# Patient Record
Sex: Male | Born: 1940
Health system: Southern US, Community
[De-identification: ages and names within clinical notes are randomized; demographics above are authoritative.]

## PROBLEM LIST (undated history)

## (undated) DIAGNOSIS — K5790 Diverticulosis of intestine, part unspecified, without perforation or abscess without bleeding: Secondary | ICD-10-CM

## (undated) DIAGNOSIS — I10 Essential (primary) hypertension: Secondary | ICD-10-CM

## (undated) DIAGNOSIS — R413 Other amnesia: Secondary | ICD-10-CM

## (undated) DIAGNOSIS — E785 Hyperlipidemia, unspecified: Secondary | ICD-10-CM

## (undated) DIAGNOSIS — E559 Vitamin D deficiency, unspecified: Secondary | ICD-10-CM

## (undated) DIAGNOSIS — R7303 Prediabetes: Secondary | ICD-10-CM

## (undated) HISTORY — DX: Other amnesia: R41.3

## (undated) HISTORY — DX: Prediabetes: R73.03

## (undated) HISTORY — DX: Hyperlipidemia, unspecified: E78.5

## (undated) HISTORY — DX: Vitamin D deficiency, unspecified: E55.9

## (undated) HISTORY — DX: Essential (primary) hypertension: I10

## (undated) HISTORY — PX: NO PAST SURGERIES: SHX2092

## (undated) HISTORY — DX: Diverticulosis of intestine, part unspecified, without perforation or abscess without bleeding: K57.90

---

## 2000-05-06 ENCOUNTER — Ambulatory Visit (HOSPITAL_BASED_OUTPATIENT_CLINIC_OR_DEPARTMENT_OTHER): Admission: RE | Admit: 2000-05-06 | Discharge: 2000-05-06 | Payer: Self-pay | Admitting: Surgery

## 2003-03-15 ENCOUNTER — Ambulatory Visit (HOSPITAL_COMMUNITY): Admission: RE | Admit: 2003-03-15 | Discharge: 2003-03-15 | Payer: Self-pay | Admitting: Internal Medicine

## 2003-03-15 ENCOUNTER — Encounter: Payer: Self-pay | Admitting: Internal Medicine

## 2004-03-15 ENCOUNTER — Ambulatory Visit (HOSPITAL_COMMUNITY): Admission: RE | Admit: 2004-03-15 | Discharge: 2004-03-15 | Payer: Self-pay | Admitting: Internal Medicine

## 2006-06-26 ENCOUNTER — Ambulatory Visit (HOSPITAL_COMMUNITY): Admission: RE | Admit: 2006-06-26 | Discharge: 2006-06-26 | Payer: Self-pay | Admitting: Internal Medicine

## 2007-07-30 ENCOUNTER — Ambulatory Visit (HOSPITAL_COMMUNITY): Admission: RE | Admit: 2007-07-30 | Discharge: 2007-07-30 | Payer: Self-pay | Admitting: Internal Medicine

## 2009-11-22 ENCOUNTER — Emergency Department (HOSPITAL_COMMUNITY): Admission: EM | Admit: 2009-11-22 | Discharge: 2009-11-22 | Payer: Self-pay | Admitting: Emergency Medicine

## 2012-10-29 ENCOUNTER — Other Ambulatory Visit: Payer: Self-pay | Admitting: Physician Assistant

## 2013-03-21 ENCOUNTER — Encounter: Payer: Self-pay | Admitting: Gastroenterology

## 2013-10-29 DIAGNOSIS — I10 Essential (primary) hypertension: Secondary | ICD-10-CM | POA: Insufficient documentation

## 2013-10-29 DIAGNOSIS — E785 Hyperlipidemia, unspecified: Secondary | ICD-10-CM | POA: Insufficient documentation

## 2013-10-29 DIAGNOSIS — E559 Vitamin D deficiency, unspecified: Secondary | ICD-10-CM | POA: Insufficient documentation

## 2013-10-29 DIAGNOSIS — R7309 Other abnormal glucose: Secondary | ICD-10-CM | POA: Insufficient documentation

## 2013-10-29 DIAGNOSIS — K5790 Diverticulosis of intestine, part unspecified, without perforation or abscess without bleeding: Secondary | ICD-10-CM | POA: Insufficient documentation

## 2013-11-02 ENCOUNTER — Encounter: Payer: Self-pay | Admitting: Internal Medicine

## 2013-11-02 ENCOUNTER — Ambulatory Visit (INDEPENDENT_AMBULATORY_CARE_PROVIDER_SITE_OTHER): Payer: Medicare HMO | Admitting: Internal Medicine

## 2013-11-02 VITALS — BP 128/78 | HR 60 | Temp 98.1°F | Resp 16 | Wt 189.0 lb

## 2013-11-02 DIAGNOSIS — E559 Vitamin D deficiency, unspecified: Secondary | ICD-10-CM

## 2013-11-02 DIAGNOSIS — R7303 Prediabetes: Secondary | ICD-10-CM

## 2013-11-02 DIAGNOSIS — E785 Hyperlipidemia, unspecified: Secondary | ICD-10-CM

## 2013-11-02 DIAGNOSIS — R7309 Other abnormal glucose: Secondary | ICD-10-CM

## 2013-11-02 DIAGNOSIS — I10 Essential (primary) hypertension: Secondary | ICD-10-CM

## 2013-11-02 DIAGNOSIS — Z79899 Other long term (current) drug therapy: Secondary | ICD-10-CM | POA: Insufficient documentation

## 2013-11-02 LAB — BASIC METABOLIC PANEL WITH GFR
BUN: 14 mg/dL (ref 6–23)
CALCIUM: 9.6 mg/dL (ref 8.4–10.5)
CO2: 26 meq/L (ref 19–32)
CREATININE: 1.1 mg/dL (ref 0.50–1.35)
Chloride: 106 mEq/L (ref 96–112)
GFR, Est African American: 77 mL/min
GFR, Est Non African American: 67 mL/min
Glucose, Bld: 82 mg/dL (ref 70–99)
Potassium: 4.4 mEq/L (ref 3.5–5.3)
Sodium: 141 mEq/L (ref 135–145)

## 2013-11-02 LAB — HEPATIC FUNCTION PANEL
ALBUMIN: 4.4 g/dL (ref 3.5–5.2)
ALK PHOS: 51 U/L (ref 39–117)
ALT: 16 U/L (ref 0–53)
AST: 17 U/L (ref 0–37)
BILIRUBIN DIRECT: 0.1 mg/dL (ref 0.0–0.3)
BILIRUBIN TOTAL: 0.5 mg/dL (ref 0.2–1.2)
Indirect Bilirubin: 0.4 mg/dL (ref 0.2–1.2)
Total Protein: 6.9 g/dL (ref 6.0–8.3)

## 2013-11-02 LAB — LIPID PANEL
Cholesterol: 172 mg/dL (ref 0–200)
HDL: 48 mg/dL (ref >39)
LDL CALC: 111 mg/dL — AB (ref 0–99)
Total CHOL/HDL Ratio: 3.6 Ratio
Triglycerides: 67 mg/dL (ref <150)
VLDL: 13 mg/dL (ref 0–40)

## 2013-11-02 LAB — CBC WITH DIFFERENTIAL/PLATELET
Basophils Absolute: 0 10*3/uL (ref 0.0–0.1)
Basophils Relative: 1 % (ref 0–1)
Eosinophils Absolute: 0 10*3/uL (ref 0.0–0.7)
Eosinophils Relative: 0 % (ref 0–5)
HCT: 40.8 % (ref 39.0–52.0)
Hemoglobin: 13.6 g/dL (ref 13.0–17.0)
LYMPHS PCT: 46 % (ref 12–46)
Lymphs Abs: 2.3 10*3/uL (ref 0.7–4.0)
MCH: 29.6 pg (ref 26.0–34.0)
MCHC: 33.3 g/dL (ref 30.0–36.0)
MCV: 88.9 fL (ref 78.0–100.0)
Monocytes Absolute: 0.5 10*3/uL (ref 0.1–1.0)
Monocytes Relative: 9 % (ref 3–12)
NEUTROS ABS: 2.2 10*3/uL (ref 1.7–7.7)
Neutrophils Relative %: 44 % (ref 43–77)
PLATELETS: 181 10*3/uL (ref 150–400)
RBC: 4.59 MIL/uL (ref 4.22–5.81)
RDW: 14.5 % (ref 11.5–15.5)
WBC: 5 10*3/uL (ref 4.0–10.5)

## 2013-11-02 LAB — HEMOGLOBIN A1C
Hgb A1c MFr Bld: 5.7 % — ABNORMAL HIGH (ref <5.7)
Mean Plasma Glucose: 117 mg/dL — ABNORMAL HIGH (ref <117)

## 2013-11-02 LAB — MAGNESIUM: MAGNESIUM: 2.1 mg/dL (ref 1.5–2.5)

## 2013-11-02 LAB — TSH: TSH: 0.833 u[IU]/mL (ref 0.350–4.500)

## 2013-11-02 NOTE — Patient Instructions (Signed)

## 2013-11-02 NOTE — Progress Notes (Signed)
Patient ID: Javier [name], male   DOB: April 16, 1941, 73 y.o.   MRN: [id number]   This very nice 73 y.o. MBM presents for 3 month follow up with Hypertension, Hyperlipidemia, Pre-Diabetes and Vitamin D Deficiency.    HTN predates since 2004 . BP has been controlled at home. Today's BP: 128/78 mmHg. Patient denies any cardiac type chest pain, palpitations, dyspnea/orthopnea/PND, dizziness, claudication, or dependent edema.   Hyperlipidemia is controlled with diet & meds. Last Cholesterol was 164, Triglycerides were 78, HDL 41 and LDL 102 in Oct 2014. Patient denies myalgias or other med SE's.    Also, the patient has history of PreDiabetes with A1c 5.8% in 08/2009 and with last A1c of 5.7% in Oct 2014. Patient denies any symptoms of reactive hypoglycemia, diabetic polys, paresthesias or visual blurring.   Further, Patient has history of Vitamin D Deficiency of 50 in 2008 and with last vitamin D of 78 in Oct 2014. Patient supplements vitamin D without any suspected side-effects.    Medication List       This list is accurate as of: 11/02/13  8:33 PM.  Always use your most recent med list.               BABY ASPIRIN PO  Take 81 mg by mouth daily.     benazepril-hydrochlorthiazide 20-25 MG per tablet  Commonly known as:  LOTENSIN HCT  Take 1 tablet by mouth daily.     omeprazole 20 MG capsule  Commonly known as:  PRILOSEC  Take 20 mg by mouth daily.     pravastatin 40 MG tablet  Commonly known as:  PRAVACHOL  Take 40 mg by mouth daily.     VITAMIN D PO  Take 5,000 Int'l Units by mouth daily.         Allergies  Allergen Reactions  . Penicillins   . Ppd [Tuberculin Purified Protein Derivative]     Positive test in 2004    PMHx:   Past Medical History  Diagnosis Date  . Hyperlipidemia   . Hypertension   . Prediabetes   . Vitamin D deficiency   . Diverticulosis     FHx:    Reviewed / unchanged  SHx:    Reviewed / unchanged  Systems Review: Constitutional: Denies  fever, chills, wt changes, headaches, insomnia, fatigue, night sweats, change in appetite. Eyes: Denies redness, blurred vision, diplopia, discharge, itchy, watery eyes.  ENT: Denies discharge, congestion, post nasal drip, epistaxis, sore throat, earache, hearing loss, dental pain, tinnitus, vertigo, sinus pain, snoring.  CV: Denies chest pain, palpitations, irregular heartbeat, syncope, dyspnea, diaphoresis, orthopnea, PND, claudication, edema. Respiratory: denies cough, dyspnea, DOE, pleurisy, hoarseness, laryngitis, wheezing.  Gastrointestinal: Denies dysphagia, odynophagia, heartburn, reflux, water brash, abdominal pain or cramps, nausea, vomiting, bloating, diarrhea, constipation, hematemesis, melena, hematochezia,  or hemorrhoids. Genitourinary: Denies dysuria, frequency, urgency, nocturia, hesitancy, discharge, hematuria, flank pain. Musculoskeletal: Denies arthralgias, myalgias, stiffness, jt. swelling, pain, limp, strain/sprain.  Skin: Denies pruritus, rash, hives, warts, acne, eczema, change in skin lesion(s). Neuro: No weakness, tremor, incoordination, spasms, paresthesia, or pain. Psychiatric: Denies confusion, memory loss, or sensory loss. Endo: Denies change in weight, skin, hair change.  Heme/Lymph: No excessive bleeding, bruising, orenlarged lymph nodes.  BP: 128/78  Pulse: 60  Temp: 98.1 F (36.7 C)  Resp: 16      On Exam: Appears well nourished - in no distress. Eyes: PERRLA, EOMs, conjunctiva no swelling or erythema. Sinuses: No frontal/maxillary tenderness ENT/Mouth: EAC's clear, TM's nl w/o erythema, bulging.  Nares clear w/o erythema, swelling, exudates. Oropharynx clear without erythema or exudates. Oral hygiene is good. Tongue normal, non obstructing. Hearing intact.  Neck: Supple. Thyroid nl. Car 2+/2+ without bruits, nodes or JVD. Chest: Respirations nl with BS clear & equal w/o rales, rhonchi, wheezing or stridor.  Cor: Heart sounds normal w/ regular rate and  rhythm without sig. murmurs, gallops, clicks, or rubs. Peripheral pulses normal and equal  without edema.  Abdomen: Soft & bowel sounds normal. Non-tender w/o guarding, rebound, hernias, masses, or organomegaly.  Lymphatics: Unremarkable.  Musculoskeletal: Full ROM all peripheral extremities, joint stability, 5/5 strength, and normal gait.  Skin: Warm, dry without exposed rashes, lesions, ecchymosis apparent.  Neuro: Cranial nerves intact, reflexes equal bilaterally. Sensory-motor testing grossly intact. Tendon reflexes grossly intact.  Pysch: Alert & oriented x 3. Insight and judgement nl & appropriate. No ideations.  Assessment and Plan:  1. Hypertension - Continue monitor blood pressure at home. Continue diet/meds same.  2. Hyperlipidemia - Continue diet/meds, exercise,& lifestyle modifications. Continue monitor periodic cholesterol/liver & renal functions   3. Pre-diabetes - Continue diet, exercise, lifestyle modifications. Monitor appropriate labs.  4. Vitamin D Deficiency - Continue supplementation.  Recommended regular exercise, BP monitoring, weight control, and discussed med and SE's. Recommended labs to assess and monitor clinical status. Further disposition pending results of labs.

## 2013-11-03 LAB — VITAMIN D 25 HYDROXY (VIT D DEFICIENCY, FRACTURES): Vit D, 25-Hydroxy: 84 ng/mL (ref 30–89)

## 2013-11-03 LAB — INSULIN, FASTING: Insulin fasting, serum: 6 u[IU]/mL (ref 3–28)

## 2013-11-07 ENCOUNTER — Telehealth: Payer: Self-pay

## 2013-11-07 NOTE — Telephone Encounter (Signed)
lmom to pt about lab results.

## 2013-11-07 NOTE — Telephone Encounter (Signed)
Message copied by Nadyne Coombes on Mon Nov 07, 2013  9:44 AM ------      Message from: Unk Pinto      Created: Sun Nov 06, 2013 11:41 PM       Cbc kidneys liver thyroid Mg cholesterol A1c all nl & OK - keep up the great work ------

## 2014-01-31 ENCOUNTER — Ambulatory Visit: Payer: Self-pay | Admitting: Physician Assistant

## 2014-02-16 ENCOUNTER — Encounter: Payer: Self-pay | Admitting: Physician Assistant

## 2014-02-16 ENCOUNTER — Ambulatory Visit (INDEPENDENT_AMBULATORY_CARE_PROVIDER_SITE_OTHER): Payer: Commercial Managed Care - HMO | Admitting: Physician Assistant

## 2014-02-16 VITALS — BP 148/80 | HR 64 | Temp 98.1°F | Resp 16 | Wt 184.0 lb

## 2014-02-16 DIAGNOSIS — Z0000 Encounter for general adult medical examination without abnormal findings: Secondary | ICD-10-CM

## 2014-02-16 DIAGNOSIS — E785 Hyperlipidemia, unspecified: Secondary | ICD-10-CM

## 2014-02-16 DIAGNOSIS — Z1331 Encounter for screening for depression: Secondary | ICD-10-CM

## 2014-02-16 DIAGNOSIS — Z79899 Other long term (current) drug therapy: Secondary | ICD-10-CM

## 2014-02-16 DIAGNOSIS — Z789 Other specified health status: Secondary | ICD-10-CM

## 2014-02-16 DIAGNOSIS — I10 Essential (primary) hypertension: Secondary | ICD-10-CM

## 2014-02-16 DIAGNOSIS — E559 Vitamin D deficiency, unspecified: Secondary | ICD-10-CM

## 2014-02-16 DIAGNOSIS — R7303 Prediabetes: Secondary | ICD-10-CM

## 2014-02-16 LAB — LIPID PANEL
Cholesterol: 160 mg/dL (ref 0–200)
HDL: 43 mg/dL (ref >39)
LDL CALC: 104 mg/dL — AB (ref 0–99)
Total CHOL/HDL Ratio: 3.7 Ratio
Triglycerides: 64 mg/dL (ref <150)
VLDL: 13 mg/dL (ref 0–40)

## 2014-02-16 LAB — BASIC METABOLIC PANEL WITH GFR
BUN: 13 mg/dL (ref 6–23)
CHLORIDE: 104 meq/L (ref 96–112)
CO2: 27 mEq/L (ref 19–32)
CREATININE: 1.17 mg/dL (ref 0.50–1.35)
Calcium: 9.8 mg/dL (ref 8.4–10.5)
GFR, Est African American: 71 mL/min
GFR, Est Non African American: 61 mL/min
GLUCOSE: 86 mg/dL (ref 70–99)
Potassium: 3.8 mEq/L (ref 3.5–5.3)
Sodium: 141 mEq/L (ref 135–145)

## 2014-02-16 LAB — CBC WITH DIFFERENTIAL/PLATELET
Basophils Absolute: 0 10*3/uL (ref 0.0–0.1)
Basophils Relative: 0 % (ref 0–1)
EOS PCT: 1 % (ref 0–5)
Eosinophils Absolute: 0 10*3/uL (ref 0.0–0.7)
HEMATOCRIT: 41.9 % (ref 39.0–52.0)
HEMOGLOBIN: 14 g/dL (ref 13.0–17.0)
LYMPHS ABS: 2.2 10*3/uL (ref 0.7–4.0)
LYMPHS PCT: 46 % (ref 12–46)
MCH: 29.5 pg (ref 26.0–34.0)
MCHC: 33.4 g/dL (ref 30.0–36.0)
MCV: 88.2 fL (ref 78.0–100.0)
MONO ABS: 0.3 10*3/uL (ref 0.1–1.0)
MONOS PCT: 7 % (ref 3–12)
Neutro Abs: 2.2 10*3/uL (ref 1.7–7.7)
Neutrophils Relative %: 46 % (ref 43–77)
Platelets: 181 10*3/uL (ref 150–400)
RBC: 4.75 MIL/uL (ref 4.22–5.81)
RDW: 14.1 % (ref 11.5–15.5)
WBC: 4.7 10*3/uL (ref 4.0–10.5)

## 2014-02-16 LAB — HEMOGLOBIN A1C
Hgb A1c MFr Bld: 5.9 % — ABNORMAL HIGH (ref <5.7)
Mean Plasma Glucose: 123 mg/dL — ABNORMAL HIGH (ref <117)

## 2014-02-16 LAB — HEPATIC FUNCTION PANEL
ALBUMIN: 4.3 g/dL (ref 3.5–5.2)
ALK PHOS: 54 U/L (ref 39–117)
ALT: 15 U/L (ref 0–53)
AST: 18 U/L (ref 0–37)
Bilirubin, Direct: 0.1 mg/dL (ref 0.0–0.3)
Indirect Bilirubin: 0.5 mg/dL (ref 0.2–1.2)
TOTAL PROTEIN: 7.2 g/dL (ref 6.0–8.3)
Total Bilirubin: 0.6 mg/dL (ref 0.2–1.2)

## 2014-02-16 LAB — MAGNESIUM: Magnesium: 2 mg/dL (ref 1.5–2.5)

## 2014-02-16 NOTE — Patient Instructions (Addendum)
Call Dr. Deatra Ina to schedule a screening colonoscopy- Phone: [phone];   [employer] Free Hearing Test with no obligation # [phone] Tues-Sat 10-6  Preventative Care for Adults, Male       [place]:  A routine yearly physical is a good way to check in with your primary care provider about your health and preventive screening. It is also an opportunity to share updates about your health and any concerns you have, and receive a thorough all-over exam.   Most health insurance companies pay for at least some preventative services.  Check with your health plan for specific coverages.  WHAT PREVENTATIVE SERVICES DO MEN NEED?  Adult men should have their weight and blood pressure checked regularly.   Men age 5 and older should have their cholesterol levels checked regularly.  Beginning at age 24 and continuing to age 57, men should be screened for colorectal cancer.  Certain people should may need continued testing until age 51.  Other cancer screening may include exams for testicular and prostate cancer.  Updating vaccinations is part of preventative care.  Vaccinations help protect against diseases such as the flu.  Lab tests are generally done as part of preventative care to screen for anemia and blood disorders, to screen for problems with the kidneys and liver, to screen for bladder problems, to check blood sugar, and to check your cholesterol level.  Preventative services generally include counseling about diet, exercise, avoiding tobacco, drugs, excessive alcohol consumption, and sexually transmitted infections.    GENERAL RECOMMENDATIONS FOR GOOD HEALTH:  Healthy diet:  Eat a variety of foods, including fruit, vegetables, animal or vegetable protein, such as meat, fish, chicken, and eggs, or beans, lentils, tofu, and grains, such as rice.  Drink plenty of water daily.  Decrease saturated fat in the diet, avoid lots of red meat, processed foods, sweets, fast  foods, and fried foods.  Exercise:  Aerobic exercise helps maintain good heart health. At least 30-40 minutes of moderate-intensity exercise is recommended. For example, a brisk walk that increases your heart rate and breathing. This should be done on most days of the week.   Find a type of exercise or a variety of exercises that you enjoy so that it becomes a part of your daily life.  Examples are running, walking, swimming, water aerobics, and biking.  For motivation and support, explore group exercise such as aerobic class, spin class, Zumba, Yoga,or  martial arts, etc.    Set exercise goals for yourself, such as a certain weight goal, walk or run in a race such as a 5k walk/run.  Speak to your primary care provider about exercise goals.  Disease prevention:  If you smoke or chew tobacco, find out from your caregiver how to quit. It can literally save your life, no matter how long you have been a tobacco user. If you do not use tobacco, never begin.   Maintain a healthy diet and normal weight. Increased weight leads to problems with blood pressure and diabetes.   The Body Mass Index or BMI is a way of measuring how much of your body is fat. Having a BMI above 27 increases the risk of heart disease, diabetes, hypertension, stroke and other problems related to obesity. Your caregiver can help determine your BMI and based on it develop an exercise and dietary program to help you achieve or maintain this important measurement at a healthful level.  High blood pressure causes heart and blood vessel problems.  Persistent  high blood pressure should be treated with medicine if weight loss and exercise do not work.   Fat and cholesterol leaves deposits in your arteries that can block them. This causes heart disease and vessel disease elsewhere in your body.  If your cholesterol is found to be high, or if you have heart disease or certain other medical conditions, then you may need to have your  cholesterol monitored frequently and be treated with medication.   Ask if you should have a stress test if your history suggests this. A stress test is a test done on a treadmill that looks for heart disease. This test can find disease prior to there being a problem.  Avoid drinking alcohol in excess (more than two drinks per day).  Avoid use of street drugs. Do not share needles with anyone. Ask for professional help if you need assistance or instructions on stopping the use of alcohol, cigarettes, and/or drugs.  Brush your teeth twice a day with fluoride toothpaste, and floss once a day. Good oral hygiene prevents tooth decay and gum disease. The problems can be painful, unattractive, and can cause other health problems. Visit your dentist for a routine oral and dental check up and preventive care every 6-12 months.   Look at your skin regularly.  Use a mirror to look at your back. Notify your caregivers of changes in moles, especially if there are changes in shapes, colors, a size larger than a pencil eraser, an irregular border, or development of new moles.  Safety:  Use seatbelts 100% of the time, whether driving or as a passenger.  Use safety devices such as hearing protection if you work in environments with loud noise or significant background noise.  Use safety glasses when doing any work that could send debris in to the eyes.  Use a helmet if you ride a bike or motorcycle.  Use appropriate safety gear for contact sports.  Talk to your caregiver about gun safety.  Use sunscreen with a SPF (or skin protection factor) of 15 or greater.  Lighter skinned people are at a greater risk of skin cancer. Don't forget to also wear sunglasses in order to protect your eyes from too much damaging sunlight. Damaging sunlight can accelerate cataract formation.   Practice safe sex. Use condoms. Condoms are used for birth control and to help reduce the spread of sexually transmitted infections (or STIs).   Some of the STIs are gonorrhea (the clap), chlamydia, syphilis, trichomonas, herpes, HPV (human papilloma virus) and HIV (human immunodeficiency virus) which causes AIDS. The herpes, HIV and HPV are viral illnesses that have no cure. These can result in disability, cancer and death.   Keep carbon monoxide and smoke detectors in your home functioning at all times. Change the batteries every 6 months or use a model that plugs into the wall.   Vaccinations:  Stay up to date with your tetanus shots and other required immunizations. You should have a booster for tetanus every 10 years. Be sure to get your flu shot every year, since 5%-20% of the [place] population comes down with the flu. The flu vaccine changes each year, so being vaccinated once is not enough. Get your shot in the fall, before the flu season peaks.   Other vaccines to consider:  Pneumococcal vaccine to protect against certain types of pneumonia.  This is normally recommended for adults age 44 or older.  However, adults younger than 73 years old with certain underlying conditions such as diabetes,  heart or lung disease should also receive the vaccine.  Shingles vaccine to protect against Varicella Zoster if you are older than age 60, or younger than 73 years old with certain underlying illness.  Hepatitis A vaccine to protect against a form of infection of the liver by a virus acquired from food.  Hepatitis B vaccine to protect against a form of infection of the liver by a virus acquired from blood or body fluids, particularly if you work in health care.  If you plan to travel internationally, check with your local health department for specific vaccination recommendations.  Cancer Screening:  Most routine colon cancer screening begins at the age of 71. On a yearly basis, doctors may provide special easy to use take-home tests to check for hidden blood in the stool. Sigmoidoscopy or colonoscopy can detect the earliest forms of colon  cancer and is life saving. These tests use a small camera at the end of a tube to directly examine the colon. Speak to your caregiver about this at age 30, when routine screening begins (and is repeated every 5 years unless early forms of pre-cancerous polyps or small growths are found).   At the age of 60 men usually start screening for prostate cancer every year. Screening may begin at a younger age for those with higher risk. Those at higher risk include African-Americans or having a family history of prostate cancer. There are two types of tests for prostate cancer:   Prostate-specific antigen (PSA) testing. Recent studies raise questions about prostate cancer using PSA and you should discuss this with your caregiver.   Digital rectal exam (in which your doctor's lubricated and gloved finger feels for enlargement of the prostate through the anus).   Screening for testicular cancer.  Do a monthly exam of your testicles. Gently roll each testicle between your thumb and fingers, feeling for any abnormal lumps. The best time to do this is after a hot shower or bath when the tissues are looser. Notify your caregivers of any lumps, tenderness or changes in size or shape immediately.

## 2014-02-16 NOTE — Progress Notes (Signed)
Assessment:   1. Hypertension - CBC with Differential - BASIC METABOLIC PANEL WITH GFR - Hepatic function panel - TSH  2. Prediabetes Discussed general issues about diabetes pathophysiology and management., Educational material distributed., Suggested low cholesterol diet., Encouraged aerobic exercise., Discussed foot care., Reminded to get yearly retinal exam. - Hemoglobin A1c - Insulin, fasting - HM DIABETES FOOT EXAM  3. Hyperlipidemia - Lipid panel  4. Vitamin D deficiency - Vit D  25 hydroxy (rtn osteoporosis monitoring)  5. Encounter for long-term (current) use of other medications - Magnesium   Plan:   During the course of the visit the patient was educated and counseled about appropriate screening and preventive services including:    Pneumococcal vaccine   Influenza vaccine  Td vaccine  Screening electrocardiogram  Colorectal cancer screening  Diabetes screening  Glaucoma screening  Nutrition counseling   Screening recommendations, referrals: Vaccinations: Tdap vaccine not indicated Influenza vaccine not indicated Pneumococcal vaccine not indicated Shingles vaccine not indicated Hep B vaccine not indicated  Nutrition assessed and recommended  Colonoscopy requested Recommended yearly ophthalmology/optometry visit for glaucoma screening and checkup Recommended yearly dental visit for hygiene and checkup Advanced directives - requested  Conditions/risks identified: BMI: Discussed weight loss, diet, and increase physical activity.  Increase physical activity: AHA recommends 150 minutes of physical activity a week.  Medications reviewed Diabetes is at goal, ACE/ARB therapy: Yes. Urinary Incontinence is not an issue: discussed non pharmacology and pharmacology options.  Fall risk: low- discussed PT, home fall assessment, medications.   Subjective:  Javier [name] is a 73 y.o. male who presents for Medicare Annual Wellness Visit and 3 month  follow up for HTN, hyperlipidemia, prediabetes, and vitamin D Def.  Date of last medicare wellness visit was is unknown.  His blood pressure has been controlled at home, normally runs 120/130's, today their BP is BP: 148/80 mmHg He does not workout this winter but with the nicer weather he wants to start walking again. He denies chest pain, shortness of breath, dizziness.  He is on cholesterol medication and denies myalgias. His cholesterol is at goal. The cholesterol last visit was:   Lab Results  Component Value Date   CHOL 172 11/02/2013   HDL 48 11/02/2013   LDLCALC 111* 11/02/2013   TRIG 67 11/02/2013   CHOLHDL 3.6 11/02/2013   He has been working on diet and exercise for prediabetes, and denies paresthesia of the feet, polydipsia and polyuria. Last A1C in the office was:  Lab Results  Component Value Date   HGBA1C 5.7* 11/02/2013   Patient is on Vitamin D supplement.     Names of Other Physician/Practitioners you currently use: 1. [facility] here for primary care 2. Dr. Wynetta Emery, dentist, last visit 2 weeks ago Patient Care Team: Unk Pinto, MD as PCP - General (Internal Medicine) Inda Castle, MD as Consulting Physician (Gastroenterology) Melissa Noon, OD as Referring Physician (Optometry) - last visit 2 years  Medication Review: Current Outpatient Prescriptions on File Prior to Visit  Medication Sig Dispense Refill  . BABY ASPIRIN PO Take 81 mg by mouth daily.      . benazepril-hydrochlorthiazide (LOTENSIN HCT) 20-25 MG per tablet Take 1 tablet by mouth daily.      . Cholecalciferol (VITAMIN D PO) Take 5,000 Int'l Units by mouth daily.      Marland Kitchen omeprazole (PRILOSEC) 20 MG capsule Take 20 mg by mouth daily.      . pravastatin (PRAVACHOL) 40 MG tablet Take  40 mg by mouth daily.       No current facility-administered medications on file prior to visit.    Current Problems (verified) Patient Active Problem List   Diagnosis Date Noted  .  Encounter for long-term (current) use of other medications 11/02/2013  . Hyperlipidemia   . Hypertension   . Prediabetes   . Vitamin D deficiency   . Diverticulosis     Screening Tests Health Maintenance  Topic Date Due  . Colonoscopy  01/30/1991  . Pneumococcal Polysaccharide Vaccine Age 36 And Over  01/29/2006  . Influenza Vaccine  04/29/2014  . Tetanus/tdap  09/21/2019  . Zostavax  Completed    Immunization History  Administered Date(s) Administered  . Influenza Split 07/28/2013  . Pneumococcal-Unspecified 07/01/1999  . Td 09/20/2009  . Zoster 11/08/2009   Preventative care: Last colonoscopy: 2004 DUE 04/2013  Prior vaccinations: TD or Tdap: 2010  Influenza: 2014  Pneumococcal: 2000 Shingles/Zostavax: 2011  History reviewed: allergies, current medications, past family history, past medical history, past social history, past surgical history and problem list  Risk Factors: Tobacco History  Substance Use Topics  . Smoking status: Former Smoker    Quit date: 12/29/2002  . Smokeless tobacco: Not on file  . Alcohol Use: No   He does not smoke.  Patient is a former smoker. Are there smokers in your home (other than you)?  No  Alcohol Current alcohol use: none  Caffeine Current caffeine use: coffee 1-2 /day  Exercise Current exercise: no regular exercise  Nutrition/Diet Current diet: in general, a "healthy" diet    Cardiac risk factors: advanced age (older than 73 for men, 9 for women), dyslipidemia, hypertension, male gender, obesity (BMI >= 30 kg/m2) and sedentary lifestyle.  Depression Screen (Note: if answer to either of the following is "Yes", a more complete depression screening is indicated)   Q1: Over the past two weeks, have you felt down, depressed or hopeless? No  Q2: Over the past two weeks, have you felt little interest or pleasure in doing things? No  Have you lost interest or pleasure in daily life? No  Do you often feel hopeless? No  Do  you cry easily over simple problems? No  Activities of Daily Living In your present state of health, do you have any difficulty performing the following activities?:  Driving? No Managing money?  No Feeding yourself? No Getting from bed to chair? No Climbing a flight of stairs? No Preparing food and eating?: No Bathing or showering? No Getting dressed: No Getting to the toilet? No Using the toilet:No Moving around from place to place: No In the past year have you fallen or had a near fall?:No   Are you sexually active?  No  Do you have more than one partner?  No  Vision Difficulties: No  Hearing Difficulties: Yes Do you often ask people to speak up or repeat themselves? Yes Do you experience ringing or noises in your ears? Yes Do you have difficulty understanding soft or whispered voices? No  Cognition  Do you feel that you have a problem with memory?No  Do you often misplace items? No  Do you feel safe at home?  Yes  Advanced directives Does patient have a [place]? Yes Does patient have a Living Will? Yes   Objective:   Blood pressure 148/80, pulse 64, temperature 98.1 F (36.7 C), resp. rate 16, weight 184 lb (83.462 kg). There is no height on file to calculate BMI.  General appearance: alert, no distress, WD/WN, male Cognitive Testing  Alert? Yes  Normal Appearance?Yes  Oriented to person? Yes  Place? Yes   Time? Yes  Recall of three objects?  Yes  Can perform simple calculations? Yes  Displays appropriate judgment?Yes  Can read the correct time from a watch face?Yes  HEENT: normocephalic, sclerae anicteric, TMs pearly, nares patent, no discharge or erythema, pharynx normal Oral cavity: MMM, no lesions Neck: supple, no lymphadenopathy, no thyromegaly, no masses Heart: RRR, normal S1, S2, no murmurs Lungs: CTA bilaterally, no wheezes, rhonchi, or rales Abdomen: +bs, soft, non tender, non distended, no masses, no hepatomegaly, no  splenomegaly Musculoskeletal: nontender, no swelling, no obvious deformity Extremities: no edema, no cyanosis, no clubbing Pulses: 2+ symmetric, upper and lower extremities, normal cap refill Neurological: alert, oriented x 3, CN2-12 intact, strength normal upper extremities and lower extremities, sensation normal throughout, DTRs 2+ throughout, no cerebellar signs, gait normal Psychiatric: normal affect, behavior normal, pleasant   Medicare Attestation I have personally reviewed: The patient's medical and social history Their use of alcohol, tobacco or illicit drugs Their current medications and supplements The patient's functional ability including ADLs,fall risks, home safety risks, cognitive, and hearing and visual impairment Diet and physical activities Evidence for depression or mood disorders  The patient's weight, height, BMI, and visual acuity have been recorded in the chart.  I have made referrals, counseling, and provided education to the patient based on review of the above and I have provided the patient with a written personalized care plan for preventive services.     Vicie Mutters, PA-C   02/16/2014

## 2014-02-17 LAB — VITAMIN D 25 HYDROXY (VIT D DEFICIENCY, FRACTURES): Vit D, 25-Hydroxy: 91 ng/mL — ABNORMAL HIGH (ref 30–89)

## 2014-02-17 LAB — INSULIN, FASTING: INSULIN FASTING, SERUM: 13 u[IU]/mL (ref 3–28)

## 2014-02-17 LAB — TSH: TSH: 0.955 u[IU]/mL (ref 0.350–4.500)

## 2014-05-05 ENCOUNTER — Encounter: Payer: Self-pay | Admitting: Internal Medicine

## 2014-05-09 ENCOUNTER — Ambulatory Visit (AMBULATORY_SURGERY_CENTER): Payer: Self-pay

## 2014-05-09 VITALS — Ht 71.0 in | Wt 187.0 lb

## 2014-05-09 DIAGNOSIS — Z1211 Encounter for screening for malignant neoplasm of colon: Secondary | ICD-10-CM

## 2014-05-09 MED ORDER — NA SULFATE-K SULFATE-MG SULF 17.5-3.13-1.6 GM/177ML PO SOLN: ORAL | Status: DC

## 2014-05-09 NOTE — Progress Notes (Signed)
Per pt, no allergies to soy or egg products.Pt not taking any weight loss meds or using  O2 at home. 

## 2014-05-10 ENCOUNTER — Encounter: Payer: Self-pay | Admitting: Gastroenterology

## 2014-05-10 ENCOUNTER — Other Ambulatory Visit: Payer: Self-pay | Admitting: Internal Medicine

## 2014-05-22 ENCOUNTER — Ambulatory Visit (AMBULATORY_SURGERY_CENTER): Payer: Commercial Managed Care - HMO | Admitting: Gastroenterology

## 2014-05-22 ENCOUNTER — Encounter: Payer: Self-pay | Admitting: Gastroenterology

## 2014-05-22 ENCOUNTER — Other Ambulatory Visit: Payer: Self-pay | Admitting: Gastroenterology

## 2014-05-22 VITALS — BP 138/80 | HR 56 | Temp 97.0°F | Resp 20 | Ht 71.0 in | Wt 187.0 lb

## 2014-05-22 DIAGNOSIS — D126 Benign neoplasm of colon, unspecified: Secondary | ICD-10-CM

## 2014-05-22 DIAGNOSIS — K5730 Diverticulosis of large intestine without perforation or abscess without bleeding: Secondary | ICD-10-CM

## 2014-05-22 DIAGNOSIS — Z1211 Encounter for screening for malignant neoplasm of colon: Secondary | ICD-10-CM

## 2014-05-22 MED ORDER — SODIUM CHLORIDE 0.9 % IV SOLN: 500.0000 mL | INTRAVENOUS | Status: DC

## 2014-05-22 NOTE — Op Note (Signed)
[facility]  [address] [place], [id number]   COLONOSCOPY PROCEDURE REPORT  PATIENT: Javier [name], Javier [name].  MR#: [id number] BIRTHDATE: 01-05-1941 , 73  yrs. old GENDER: Male ENDOSCOPIST: Inda Castle, MD REFERRED PZ:WCHENID Melford Aase, M.D. PROCEDURE DATE:  05/22/2014 PROCEDURE:   Colonoscopy with snare polypectomy First Screening Colonoscopy - Avg.  risk and is 50 yrs.  old or older - No.  Prior Negative Screening - Now for repeat screening. 10 or more years since last screening  History of Adenoma - Now for follow-up colonoscopy & has been > or = to 3 yrs.  N/A  Polyps Removed Today? Yes. ASA CLASS:   Class II INDICATIONS:Average risk patient for colon cancer. MEDICATIONS: MAC sedation, administered by CRNA and Propofol (Diprivan) 230 mg IV  DESCRIPTION OF PROCEDURE:   After the risks benefits and alternatives of the procedure were thoroughly explained, informed consent was obtained.  A digital rectal exam revealed no abnormalities of the rectum.   The LB [id number]  endoscope was introduced through the anus and advanced to the cecum, which was identified by both the appendix and ileocecal valve. No adverse events experienced.   The quality of the prep was excellent using Suprep  The instrument was then slowly withdrawn as the colon was fully examined.      COLON FINDINGS: A sessile polyp measuring 3 mm in size was found in the ascending colon.  A polypectomy was performed with a cold snare.  The resection was complete and the polyp tissue was completely retrieved.   A sessile polyp measuring 5 mm in size was found in the proximal transverse colon.  A polypectomy was performed with a cold snare.  The resection was complete and the polyp tissue was completely retrieved.   A sessile polyp measuring 4 mm in size was found in the descending colon.  A polypectomy was performed with a cold snare.  The resection was complete and the polyp tissue was  completely retrieved.   A semi-pedunculated polyp measuring 10 mm in size was found in the sigmoid colon.  A polypectomy was performed with a cold snare.  The resection was complete and the polyp tissue was completely retrieved.   The colon was otherwise normal.  There was no diverticulosis, inflammation, polyps or cancers unless previously stated.   Mild diverticulosis was noted in the descending colon.  Retroflexed views revealed no abnormalities. The time to cecum=minutes 0 seconds.  Withdrawal time=13 minutes 36 seconds.  The scope was withdrawn and the procedure completed. COMPLICATIONS: There were no complications.  ENDOSCOPIC IMPRESSION: 1.   Sessile polyp measuring 3 mm in size was found in the ascending colon; polypectomy was performed with a cold snare 2.   Sessile polyp measuring 5 mm in size was found in the proximal transverse colon; polypectomy was performed with a cold snare 3.   Sessile polyp measuring 4 mm in size was found in the descending colon; polypectomy was performed with a cold snare 4.   Semi-pedunculated polyp measuring 10 mm in size was found in the sigmoid colon; polypectomy was performed with a cold snare 5.  diverticulosis   RECOMMENDATIONS: If the polyp(s) removed today are proven to be adenomatous (pre-cancerous) polyps, you will need a colonoscopy in 3 years. Otherwise you should continue to follow colorectal cancer screening guidelines for "routine risk" patients with a colonoscopy in 10 years.  You will receive a letter within 1-2 weeks with the results of your biopsy as well as  final recommendations.  Please call my office if you have not received a letter after 3 weeks.   eSigned:  Inda Castle, MD 05/22/2014 10:16 AM   cc:   PATIENT NAME:  [name] MR#: [id number]

## 2014-05-22 NOTE — Patient Instructions (Signed)
YOU HAD AN ENDOSCOPIC PROCEDURE TODAY AT THE [facility]: Refer to the procedure report that was given to you for any specific questions about what was found during the examination.  If the procedure report does not answer your questions, please call your gastroenterologist to clarify.  If you requested that your care partner not be given the details of your procedure findings, then the procedure report has been included in a sealed envelope for you to review at your convenience later.  YOU SHOULD EXPECT: Some feelings of bloating in the abdomen. Passage of more gas than usual.  Walking can help get rid of the air that was put into your GI tract during the procedure and reduce the bloating. If you had a lower endoscopy (such as a colonoscopy or flexible sigmoidoscopy) you may notice spotting of blood in your stool or on the toilet paper. If you underwent a bowel prep for your procedure, then you may not have a normal bowel movement for a few days.  DIET: Your first meal following the procedure should be a light meal and then it is ok to progress to your normal diet.  A half-sandwich or bowl of soup is an example of a good first meal.  Heavy or fried foods are harder to digest and may make you feel nauseous or bloated.  Likewise meals heavy in dairy and vegetables can cause extra gas to form and this can also increase the bloating.  Drink plenty of fluids but you should avoid alcoholic beverages for 24 hours.  ACTIVITY: Your care partner should take you home directly after the procedure.  You should plan to take it easy, moving slowly for the rest of the day.  You can resume normal activity the day after the procedure however you should NOT DRIVE or use heavy machinery for 24 hours (because of the sedation medicines used during the test).    SYMPTOMS TO REPORT IMMEDIATELY: A gastroenterologist can be reached at any hour.  During normal business hours, 8:30 AM to 5:00 PM Monday through Friday,  call [phone].  After hours and on weekends, please call the GI answering service at [phone] who will take a message and have the physician on call contact you.   Following lower endoscopy (colonoscopy or flexible sigmoidoscopy):  Excessive amounts of blood in the stool  Significant tenderness or worsening of abdominal pains  Swelling of the abdomen that is new, acute  Fever of 100F or higher  FOLLOW UP: If any biopsies were taken you will be contacted by phone or by letter within the next 1-3 weeks.  Call your gastroenterologist if you have not heard about the biopsies in 3 weeks.  Our staff will call the home number listed on your records the next business day following your procedure to check on you and address any questions or concerns that you may have at that time regarding the information given to you following your procedure. This is a courtesy call and so if there is no answer at the home number and we have not heard from you through the emergency physician on call, we will assume that you have returned to your regular daily activities without incident.  SIGNATURES/CONFIDENTIALITY: You and/or your care partner have signed paperwork which will be entered into your electronic medical record.  These signatures attest to the fact that that the information above on your After Visit Summary has been reviewed and is understood.  Full responsibility of the confidentiality of this   discharge information lies with you and/or your care-partner.  Await pathology  Please read over handouts about polyps, diverticulosis and high fiber diets  Continue your normal medications 

## 2014-05-22 NOTE — Progress Notes (Signed)
Called to room to assist during endoscopic procedure.  Patient ID and intended procedure confirmed with present staff. Received instructions for my participation in the procedure from the performing physician.  

## 2014-05-22 NOTE — Progress Notes (Signed)
Procedure ends, to recovery, report given and VSS. 

## 2014-05-23 ENCOUNTER — Telehealth: Payer: Self-pay

## 2014-05-23 NOTE — Telephone Encounter (Signed)
  Follow up Call-  Call back number 05/22/2014  Post procedure Call Back phone  # [phone]  Permission to leave phone message Yes     Patient questions:  Do you have a fever, pain , or abdominal swelling? No. Pain Score  0 *  Have you tolerated food without any problems? Yes.    Have you been able to return to your normal activities? Yes.    Do you have any questions about your discharge instructions: Diet   No. Medications  No. Follow up visit  No.  Do you have questions or concerns about your Care? No.  Actions: * If pain score is 4 or above: No action needed, pain <4.

## 2014-05-29 ENCOUNTER — Encounter: Payer: Self-pay | Admitting: Gastroenterology

## 2014-07-26 ENCOUNTER — Ambulatory Visit (INDEPENDENT_AMBULATORY_CARE_PROVIDER_SITE_OTHER): Payer: Commercial Managed Care - HMO | Admitting: Internal Medicine

## 2014-07-26 ENCOUNTER — Encounter: Payer: Self-pay | Admitting: Internal Medicine

## 2014-07-26 VITALS — BP 148/88 | HR 60 | Temp 99.0°F | Resp 16 | Ht 72.5 in | Wt 188.2 lb

## 2014-07-26 DIAGNOSIS — Z9181 History of falling: Secondary | ICD-10-CM

## 2014-07-26 DIAGNOSIS — R7309 Other abnormal glucose: Secondary | ICD-10-CM

## 2014-07-26 DIAGNOSIS — Z79899 Other long term (current) drug therapy: Secondary | ICD-10-CM

## 2014-07-26 DIAGNOSIS — R7303 Prediabetes: Secondary | ICD-10-CM

## 2014-07-26 DIAGNOSIS — Z0001 Encounter for general adult medical examination with abnormal findings: Secondary | ICD-10-CM

## 2014-07-26 DIAGNOSIS — Z125 Encounter for screening for malignant neoplasm of prostate: Secondary | ICD-10-CM

## 2014-07-26 DIAGNOSIS — Z1331 Encounter for screening for depression: Secondary | ICD-10-CM

## 2014-07-26 DIAGNOSIS — E785 Hyperlipidemia, unspecified: Secondary | ICD-10-CM

## 2014-07-26 DIAGNOSIS — E559 Vitamin D deficiency, unspecified: Secondary | ICD-10-CM

## 2014-07-26 DIAGNOSIS — R6889 Other general symptoms and signs: Secondary | ICD-10-CM

## 2014-07-26 DIAGNOSIS — I10 Essential (primary) hypertension: Secondary | ICD-10-CM

## 2014-07-26 DIAGNOSIS — Z1212 Encounter for screening for malignant neoplasm of rectum: Secondary | ICD-10-CM

## 2014-07-26 LAB — CBC WITH DIFFERENTIAL/PLATELET
BASOS ABS: 0.1 10*3/uL (ref 0.0–0.1)
Basophils Relative: 1 % (ref 0–1)
EOS PCT: 1 % (ref 0–5)
Eosinophils Absolute: 0.1 10*3/uL (ref 0.0–0.7)
HCT: 41.6 % (ref 39.0–52.0)
Hemoglobin: 13.8 g/dL (ref 13.0–17.0)
Lymphocytes Relative: 53 % — ABNORMAL HIGH (ref 12–46)
Lymphs Abs: 2.8 10*3/uL (ref 0.7–4.0)
MCH: 29.3 pg (ref 26.0–34.0)
MCHC: 33.2 g/dL (ref 30.0–36.0)
MCV: 88.3 fL (ref 78.0–100.0)
MONO ABS: 0.4 10*3/uL (ref 0.1–1.0)
Monocytes Relative: 7 % (ref 3–12)
Neutro Abs: 2 10*3/uL (ref 1.7–7.7)
Neutrophils Relative %: 38 % — ABNORMAL LOW (ref 43–77)
Platelets: 173 10*3/uL (ref 150–400)
RBC: 4.71 MIL/uL (ref 4.22–5.81)
RDW: 14.2 % (ref 11.5–15.5)
WBC: 5.3 10*3/uL (ref 4.0–10.5)

## 2014-07-26 LAB — HEPATIC FUNCTION PANEL
ALBUMIN: 4.1 g/dL (ref 3.5–5.2)
ALK PHOS: 50 U/L (ref 39–117)
ALT: 14 U/L (ref 0–53)
AST: 20 U/L (ref 0–37)
Bilirubin, Direct: 0.1 mg/dL (ref 0.0–0.3)
Indirect Bilirubin: 0.3 mg/dL (ref 0.2–1.2)
TOTAL PROTEIN: 6.7 g/dL (ref 6.0–8.3)
Total Bilirubin: 0.4 mg/dL (ref 0.2–1.2)

## 2014-07-26 LAB — LIPID PANEL
Cholesterol: 167 mg/dL (ref 0–200)
HDL: 42 mg/dL (ref >39)
LDL CALC: 112 mg/dL — AB (ref 0–99)
TRIGLYCERIDES: 65 mg/dL (ref <150)
Total CHOL/HDL Ratio: 4 Ratio
VLDL: 13 mg/dL (ref 0–40)

## 2014-07-26 LAB — BASIC METABOLIC PANEL WITH GFR
BUN: 12 mg/dL (ref 6–23)
CO2: 25 mEq/L (ref 19–32)
CREATININE: 1.21 mg/dL (ref 0.50–1.35)
Calcium: 9.4 mg/dL (ref 8.4–10.5)
Chloride: 109 mEq/L (ref 96–112)
GFR, EST AFRICAN AMERICAN: 68 mL/min
GFR, Est Non African American: 59 mL/min — ABNORMAL LOW
Glucose, Bld: 93 mg/dL (ref 70–99)
Potassium: 4.1 mEq/L (ref 3.5–5.3)
Sodium: 142 mEq/L (ref 135–145)

## 2014-07-26 LAB — URINALYSIS, MICROSCOPIC ONLY: Casts: NONE SEEN

## 2014-07-26 LAB — MICROALBUMIN / CREATININE URINE RATIO
Creatinine, Urine: 259.5 mg/dL
MICROALB UR: 0.6 mg/dL (ref <2.0)
Microalb Creat Ratio: 2.3 mg/g (ref 0.0–30.0)

## 2014-07-26 LAB — MAGNESIUM: Magnesium: 2.1 mg/dL (ref 1.5–2.5)

## 2014-07-26 LAB — HEMOGLOBIN A1C
Hgb A1c MFr Bld: 5.9 % — ABNORMAL HIGH (ref <5.7)
Mean Plasma Glucose: 123 mg/dL — ABNORMAL HIGH (ref <117)

## 2014-07-26 LAB — TSH: TSH: 1.099 u[IU]/mL (ref 0.350–4.500)

## 2014-07-26 NOTE — Progress Notes (Signed)
Patient ID: Javier [name], male   DOB: 1940/11/23, 73 y.o.   MRN: [id number]  Annual Preventative & Screening Comprehensive Examination  This very nice 73 y.o.MBM presents for complete physical.  Patient has been followed for HTN, GERD, Prediabetes, Hyperlipidemia, and Vitamin D Deficiency.   HTN predates since 2004. Patient's BP has been controlled at home.Today's BP: 148/88 mmHg. Patient denies any cardiac symptoms as chest pain, palpitations, shortness of breath, dizziness or ankle swelling.   Patient's hyperlipidemia is controlled with diet and medications. Patient denies myalgias or other medication SE's. Last lipids were Total Chol 160; HDL  43; LDL  104; Trig 64 on 02/16/2014.   Patient has prediabetes since 08/2009 with an A1c of 5.8%  and patient denies reactive hypoglycemic symptoms, visual blurring, diabetic polys or paresthesias. Last A1c was 5.9% on  02/16/2014.     Finally, patient has history of Vitamin D Deficiency and last vitamin D was 91 on 02/16/2014.   Medication Sig  . BABY ASPIRIN PO Take 81 mg  daily.  . benazepril-hctz 20-25  TAKE 1 T EVERY DAY  FOR  BP  . Cholecalciferol (VITAMIN D PO) Take 5,000 Int'l Units  daily.  Marland Kitchen omeprazole  20 MG cap Take 20 mg  daily.  . pravastatin  40 MG tab Take 40 mg  daily.   Allergies  Allergen Reactions  . Penicillins   . Ppd [Tuberculin Purified Protein Derivative]     Positive test in 2004   Past Medical History  Diagnosis Date  . Hyperlipidemia   . Hypertension   . Prediabetes   . Vitamin D deficiency   . Diverticulosis    Health Maintenance  Topic Date Due  . Pneumococcal Polysaccharide Vaccine Age 67 And Over  01/29/2006  . Influenza Vaccine  04/29/2014  . Colonoscopy  05/22/2017  . Tetanus/tdap  09/21/2019  . Zostavax  Completed   Immunization History  Administered Date(s) Administered  . Influenza Split 07/28/2013  . Pneumococcal-Unspecified 07/01/1999  . Td 09/20/2009  . Zoster 11/08/2009   Past Surgical  History  Procedure Laterality Date  . No past surgeries     Family History  Problem Relation Age of Onset  . Stroke Mother   . Early death Father     Farm/tractor accident  . Diabetes Brother   . Cirrhosis Brother    History   Social History  . Marital Status: Married    Spouse Name: N/A    Number of Children: N/A  . Years of Education: N/A   Occupational History  . 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 Smoking status: Former Smoker    Types: Cigarettes    Quit date: 01/17/2003  . Smokeless tobacco: Never Used     Comment: Occasional cigars.  . Alcohol Use: No     Comment: rarely  . Drug Use: No  . Sexual Activity: Not on file    ROS Constitutional: Denies fever, chills, weight loss/gain, headaches, insomnia, fatigue, night sweats or change in appetite. Eyes: Denies redness, blurred vision, diplopia, discharge, itchy or watery eyes.  ENT: Denies discharge, congestion, post nasal drip, epistaxis, sore throat, earache, hearing loss, dental pain, Tinnitus, Vertigo, Sinus pain or snoring.  Cardio: Denies chest pain, palpitations, irregular heartbeat, syncope, dyspnea, diaphoresis, orthopnea, PND, claudication or edema Respiratory: denies cough, dyspnea, DOE, pleurisy, hoarseness, laryngitis or wheezing.  Gastrointestinal: Denies dysphagia, heartburn, reflux, water brash, pain, cramps, nausea, vomiting, bloating, diarrhea, constipation, hematemesis, melena, hematochezia, jaundice or hemorrhoids  Genitourinary: Denies dysuria, frequency, urgency, nocturia, hesitancy, discharge, hematuria or flank pain Musculoskeletal: Denies arthralgia, myalgia, stiffness, Jt. Swelling, pain, limp or strain/sprain. Denies Falls. Skin: Denies puritis, rash, hives, warts, acne, eczema or change in skin lesion Neuro: No weakness, tremor, incoordination, spasms, paresthesia or pain Psychiatric: Denies confusion, memory loss or sensory loss. Denies Depression. Endocrine: Denies  change in weight, skin, hair change, nocturia, and paresthesia, diabetic polys, visual blurring or hyper / hypo glycemic episodes.  Heme/Lymph: No excessive bleeding, bruising or enlarged lymph nodes.  Physical Exam  BP 148/88  Pulse 60  Temp(Src) 99 F (37.2 C) (Temporal)  Resp 16  Ht 6' 0.5" (1.842 m)  Wt 188 lb 3.2 oz (85.367 kg)  BMI 25.16 kg/m2  General Appearance: Well nourished, in no apparent distress. Eyes: PERRLA, EOMs, conjunctiva no swelling or erythema, normal fundi and vessels. Sinuses: No frontal/maxillary tenderness ENT/Mouth: EACs patent / TMs  nl. Nares clear without erythema, swelling, mucoid exudates. Oral hygiene is good. No erythema, swelling, or exudate. Tongue normal, non-obstructing. Tonsils not swollen or erythematous. Hearing normal.  Neck: Supple, thyroid normal. No bruits, nodes or JVD. Respiratory: Respiratory effort normal.  BS equal and clear bilateral without rales, rhonci, wheezing or stridor. Cardio: Heart sounds are normal with regular rate and rhythm and no murmurs, rubs or gallops. Peripheral pulses are normal and equal bilaterally without edema. No aortic or femoral bruits. Chest: symmetric with normal excursions and percussion.  Abdomen: Flat, soft, with bowl sounds. Nontender, no guarding, rebound, hernias, masses, or organomegaly.  Lymphatics: Non tender without lymphadenopathy.  Genitourinary: No hernias.Testes nl. DRE - prostate nl for age - smooth & firm w/o nodules. Musculoskeletal: Full ROM all peripheral extremities, joint stability, 5/5 strength, and normal gait. Skin: Warm and dry without rashes, lesions, cyanosis, clubbing or  ecchymosis.  Neuro: Cranial nerves intact, reflexes equal bilaterally. Normal muscle tone, no cerebellar symptoms. Sensation intact.  Pysch: Awake and oriented X 3with normal affect, insight and judgment appropriate.   Assessment and Plan  1. Annual Screening Examination 2. Hypertension  3. Hyperlipidemia 4.  Pre Diabetes 5. Vitamin D Deficiency   Continue prudent diet as discussed, weight control, BP monitoring, regular exercise, and medications as discussed.  Discussed med effects and SE's. Routine screening labs and tests as requested with regular follow-up as recommended.

## 2014-07-26 NOTE — Patient Instructions (Signed)
Recommend the book "The END of DIETING" by Dr Baker Janus   and the book "The END of DIABETES " by Dr Excell Seltzer  At [email or web address] - get book & Audio CD's      Being diabetic has a  300% increased risk for heart attack, stroke, cancer, and alzheimer- type vascular dementia. It is very important that you work harder with diet by avoiding all foods that are white except chicken & fish. Avoid white rice (brown & wild rice is OK), white potatoes (sweetpotatoes in moderation is OK), White bread or wheat bread or anything made out of white flour like bagels, donuts, rolls, buns, biscuits, cakes, pastries, cookies, pizza crust, and pasta (made from white flour & egg whites) - vegetarian pasta or spinach or wheat pasta is OK. Multigrain breads like Arnold's or [employer], or multigrain sandwich thins or flatbreads.  Diet, exercise and weight loss can reverse and cure diabetes in the early stages.  Diet, exercise and weight loss is very important in the control and prevention of complications of diabetes which affects every system in your body, ie. Brain - dementia/stroke, eyes - glaucoma/blindness, heart - heart attack/heart failure, kidneys - dialysis, stomach - gastric paralysis, intestines - malabsorption, nerves - severe painful neuritis, circulation - gangrene & loss of a leg(s), and finally cancer and Alzheimers.    I recommend avoid fried & greasy foods,  sweets/candy, white rice (brown or wild rice or Quinoa is OK), white potatoes (sweet potatoes are OK) - anything made from white flour - bagels, doughnuts, rolls, buns, biscuits,white and wheat breads, pizza crust and traditional pasta made of white flour & egg white(vegetarian pasta or spinach or wheat pasta is OK).  Multi-grain bread is OK - like multi-grain flat bread or sandwich thins. Avoid alcohol in excess. Exercise is also important.    Eat all the vegetables you want - avoid meat, especially red meat and dairy - especially cheese.  Cheese  is the most concentrated form of trans-fats which is the worst thing to clog up our arteries. Veggie cheese is OK which can be found in the fresh produce section at [name] or [employer] or [employer]   Preventive Care for Adults  A healthy lifestyle and preventive care can promote health and wellness. Preventive health guidelines for men include the following key practices:  A routine yearly physical is a good way to check with your health care provider about your health and preventative screening. It is a chance to share any concerns and updates on your health and to receive a thorough exam.  Visit your dentist for a routine exam and preventative care every 6 months. Brush your teeth twice a day and floss once a day. Good oral hygiene prevents tooth decay and gum disease.  The frequency of eye exams is based on your age, health, family medical history, use of contact lenses, and other factors. Follow your health care provider's recommendations for frequency of eye exams.  Eat a healthy diet. Foods such as vegetables, fruits, whole grains, low-fat dairy products, and lean protein foods contain the nutrients you need without too many calories. Decrease your intake of foods high in solid fats, added sugars, and salt. Eat the right amount of calories for you.Get information about a proper diet from your health care provider, if necessary.  Regular physical exercise is one of the most important things you can do for your health. Most adults should get at least 150 minutes of moderate-intensity exercise (any  activity that increases your heart rate and causes you to sweat) each week. In addition, most adults need muscle-strengthening exercises on 2 or more days a week.  Maintain a healthy weight. The body mass index (BMI) is a screening tool to identify possible weight problems. It provides an estimate of body fat based on height and weight. Your health care provider can find your BMI and can help  you achieve or maintain a healthy weight.For adults 20 years and older:  A BMI below 18.5 is considered underweight.  A BMI of 18.5 to 24.9 is normal.  A BMI of 25 to 29.9 is considered overweight.  A BMI of 30 and above is considered obese.  Maintain normal blood lipids and cholesterol levels by exercising and minimizing your intake of saturated fat. Eat a balanced diet with plenty of fruit and vegetables. Blood tests for lipids and cholesterol should begin at age 59 and be repeated every 5 years. If your lipid or cholesterol levels are high, you are over 50, or you are at high risk for heart disease, you may need your cholesterol levels checked more frequently.Ongoing high lipid and cholesterol levels should be treated with medicines if diet and exercise are not working.  If you smoke, find out from your health care provider how to quit. If you do not use tobacco, do not start.  Lung cancer screening is recommended for adults aged 5-80 years who are at high risk for developing lung cancer because of a history of smoking. A yearly low-dose CT scan of the lungs is recommended for people who have at least a 30-pack-year history of smoking and are a current smoker or have quit within the past 15 years. A pack year of smoking is smoking an average of 1 pack of cigarettes a day for 1 year (for example: 1 pack a day for 30 years or 2 packs a day for 15 years). Yearly screening should continue until the smoker has stopped smoking for at least 15 years. Yearly screening should be stopped for people who develop a health problem that would prevent them from having lung cancer treatment.  If you choose to drink alcohol, do not have more than 2 drinks per day. One drink is considered to be 12 ounces (355 mL) of beer, 5 ounces (148 mL) of wine, or 1.5 ounces (44 mL) of liquor.  Avoid use of street drugs. Do not share needles with anyone. Ask for help if you need support or instructions about stopping the  use of drugs.  High blood pressure causes heart disease and increases the risk of stroke. Your blood pressure should be checked at least every 1-2 years. Ongoing high blood pressure should be treated with medicines, if weight loss and exercise are not effective.  If you are 25-28 years old, ask your health care provider if you should take aspirin to prevent heart disease.  Diabetes screening involves taking a blood sample to check your fasting blood sugar level. Testing should be considered at a younger age or be carried out more frequently if you are overweight and have at least 1 risk factor for diabetes.  Colorectal cancer can be detected and often prevented. Most routine colorectal cancer screening begins at the age of 27 and continues through age 13. However, your health care provider may recommend screening at an earlier age if you have risk factors for colon cancer. On a yearly basis, your health care provider may provide home test kits to check for hidden  blood in the stool. Use of a small camera at the end of a tube to directly examine the colon (sigmoidoscopy or colonoscopy) can detect the earliest forms of colorectal cancer. Talk to your health care provider about this at age 52, when routine screening begins. Direct exam of the colon should be repeated every 5-10 years through age 6, unless early forms of precancerous polyps or small growths are found.  Hepatitis C blood testing is recommended for all people born from [birth year] through 1965 and any individual with known risks for hepatitis C.  Screening for abdominal aortic aneurysm (AAA)  by ultrasound is recommended for men who have history of high blood pressure or who are current or former smokers.  Healthy men should  receive prostate-specific antigen (PSA) blood tests as part of routine cancer screening. Talk with your health care provider about prostate cancer screening.  Testicular cancer screening is  recommended for adult males.  Screening includes self-exam, a health care provider exam, and other screening tests. Consult with your health care provider about any symptoms you have or any concerns you have about testicular cancer.  Use sunscreen. Apply sunscreen liberally and repeatedly throughout the day. You should seek shade when your shadow is shorter than you. Protect yourself by wearing long sleeves, pants, a wide-brimmed hat, and sunglasses year round, whenever you are outdoors.  Once a month, do a whole-body skin exam, using a mirror to look at the skin on your back. Tell your health care provider about new moles, moles that have irregular borders, moles that are larger than a pencil eraser, or moles that have changed in shape or color.  Stay current with required vaccines (immunizations).  Influenza vaccine. All adults should be immunized every year.  Tetanus, diphtheria, and acellular pertussis (Td, Tdap) vaccine. An adult who has not previously received Tdap or who does not know his vaccine status should receive 1 dose of Tdap. This initial dose should be followed by tetanus and diphtheria toxoids (Td) booster doses every 10 years. Adults with an unknown or incomplete history of completing a 3-dose immunization series with Td-containing vaccines should begin or complete a primary immunization series including a Tdap dose. Adults should receive a Td booster every 10 years.  Zoster vaccine. One dose is recommended for adults aged 36 years or older unless certain conditions are present.    PREVNAR - Pneumococcal 13-valent conjugate (PCV13) vaccine. When indicated, a person who is uncertain of his immunization history and has no record of immunization should receive the PCV13 vaccine. An adult aged 61 years or older who has certain medical conditions and has not been previously immunized should receive 1 dose of PCV13 vaccine. This PCV13 should be followed with a dose of pneumococcal polysaccharide (PPSV23) vaccine. The  PPSV23 vaccine dose should be obtained at least 8 weeks after the dose of PCV13 vaccine. An adult aged 4 years or older who has certain medical conditions and previously received 1 or more doses of PPSV23 vaccine should receive 1 dose of PCV13. The PCV13 vaccine dose should be obtained 1 or more years after the last PPSV23 vaccine dose.    PNEUMOVAX - Pneumococcal polysaccharide (PPSV23) vaccine. When PCV13 is also indicated, PCV13 should be obtained first. All adults aged 27 years and older should be immunized. An adult younger than age 50 years who has certain medical conditions should be immunized. Any person who resides in a nursing home or long-term care facility should be immunized. An adult smoker should be  immunized. People with an immunocompromised condition and certain other conditions should receive both PCV13 and PPSV23 vaccines. People with human immunodeficiency virus (HIV) infection should be immunized as soon as possible after diagnosis. Immunization during chemotherapy or radiation therapy should be avoided. Routine use of PPSV23 vaccine is not recommended for American Indians, [place] Natives, or people younger than 65 years unless there are medical conditions that require PPSV23 vaccine. When indicated, people who have unknown immunization and have no record of immunization should receive PPSV23 vaccine. One-time revaccination 5 years after the first dose of PPSV23 is recommended for people aged 19-64 years who have chronic kidney failure, nephrotic syndrome, asplenia, or immunocompromised conditions. People who received 1-2 doses of PPSV23 before age 75 years should receive another dose of PPSV23 vaccine at age 55 years or later if at least 5 years have passed since the previous dose. Doses of PPSV23 are not needed for people immunized with PPSV23 at or after age 73 years.    Hepatitis A vaccine. Adults who wish to be protected from this disease, have certain high-risk conditions, work  with hepatitis A-infected animals, work in hepatitis A research labs, or travel to or work in countries with a high rate of hepatitis A should be immunized. Adults who were previously unvaccinated and who anticipate close contact with an international adoptee during the first 60 days after arrival in the [place] from a country with a high rate of hepatitis A should be immunized.    Hepatitis B vaccine. Adults should be immunized if they wish to be protected from this disease, have certain high-risk conditions, may be exposed to blood or other infectious body fluids, are household contacts or sex partners of hepatitis B positive people, are clients or workers in certain care facilities, or travel to or work in countries with a high rate of hepatitis B.   Preventive Service / Frequency   Ages 52 and over  Blood pressure check.  Lipid and cholesterol check.  Lung cancer screening. / Every year if you are aged 78-80 years and have a 30-pack-year history of smoking and currently smoke or have quit within the past 15 years. Yearly screening is stopped once you have quit smoking for at least 15 years or develop a health problem that would prevent you from having lung cancer treatment.  Fecal occult blood test (FOBT) of stool. You may not have to do this test if you get a colonoscopy every 10 years.  Flexible sigmoidoscopy** or colonoscopy.** / Every 5 years for a flexible sigmoidoscopy or every 10 years for a colonoscopy beginning at age 58 and continuing until age 45.  Hepatitis C blood test.** / For all people born from [birth year] through 1965 and any individual with known risks for hepatitis C.  Abdominal aortic aneurysm (AAA) screening./ Screening current or former smokers or have Hypertension.  Skin self-exam. / Monthly.  Influenza vaccine. / Every year.  Tetanus, diphtheria, and acellular pertussis (Tdap/Td) vaccine.** / 1 dose of Td every 10 years.   Zoster vaccine.** / 1 dose for  adults aged 17 years or older.         Pneumococcal 13-valent conjugate (PCV13) vaccine.    Pneumococcal polysaccharide (PPSV23) vaccine.     Hepatitis A vaccine.** / Consult your health care provider.  Hepatitis B vaccine.** / Consult your health care provider.

## 2014-07-27 LAB — INSULIN, FASTING: INSULIN FASTING, SERUM: 6.4 u[IU]/mL (ref 2.0–19.6)

## 2014-07-27 LAB — PSA: PSA: 1.81 ng/mL (ref <=4.00)

## 2014-07-27 LAB — VITAMIN D 25 HYDROXY (VIT D DEFICIENCY, FRACTURES): Vit D, 25-Hydroxy: 84 ng/mL (ref 30–89)

## 2014-08-02 ENCOUNTER — Other Ambulatory Visit: Payer: Self-pay | Admitting: Internal Medicine

## 2014-11-01 ENCOUNTER — Ambulatory Visit: Payer: Self-pay | Admitting: Physician Assistant

## 2014-11-08 ENCOUNTER — Encounter: Payer: Self-pay | Admitting: Physician Assistant

## 2014-11-08 ENCOUNTER — Ambulatory Visit (INDEPENDENT_AMBULATORY_CARE_PROVIDER_SITE_OTHER): Payer: Commercial Managed Care - HMO | Admitting: Physician Assistant

## 2014-11-08 VITALS — BP 140/78 | HR 68 | Temp 98.4°F | Resp 16 | Ht 72.5 in | Wt 187.0 lb

## 2014-11-08 DIAGNOSIS — I10 Essential (primary) hypertension: Secondary | ICD-10-CM

## 2014-11-08 DIAGNOSIS — E785 Hyperlipidemia, unspecified: Secondary | ICD-10-CM

## 2014-11-08 DIAGNOSIS — R7309 Other abnormal glucose: Secondary | ICD-10-CM | POA: Diagnosis not present

## 2014-11-08 DIAGNOSIS — E559 Vitamin D deficiency, unspecified: Secondary | ICD-10-CM | POA: Diagnosis not present

## 2014-11-08 DIAGNOSIS — Z79899 Other long term (current) drug therapy: Secondary | ICD-10-CM | POA: Diagnosis not present

## 2014-11-08 DIAGNOSIS — R7303 Prediabetes: Secondary | ICD-10-CM

## 2014-11-08 LAB — CBC WITH DIFFERENTIAL/PLATELET
Basophils Absolute: 0.1 10*3/uL (ref 0.0–0.1)
Basophils Relative: 1 % (ref 0–1)
EOS ABS: 0.1 10*3/uL (ref 0.0–0.7)
EOS PCT: 1 % (ref 0–5)
HCT: 43.2 % (ref 39.0–52.0)
HEMOGLOBIN: 13.9 g/dL (ref 13.0–17.0)
Lymphocytes Relative: 49 % — ABNORMAL HIGH (ref 12–46)
Lymphs Abs: 2.5 10*3/uL (ref 0.7–4.0)
MCH: 29 pg (ref 26.0–34.0)
MCHC: 32.2 g/dL (ref 30.0–36.0)
MCV: 90 fL (ref 78.0–100.0)
MPV: 11.4 fL (ref 8.6–12.4)
Monocytes Absolute: 0.4 10*3/uL (ref 0.1–1.0)
Monocytes Relative: 8 % (ref 3–12)
NEUTROS PCT: 41 % — AB (ref 43–77)
Neutro Abs: 2.1 10*3/uL (ref 1.7–7.7)
Platelets: 164 10*3/uL (ref 150–400)
RBC: 4.8 MIL/uL (ref 4.22–5.81)
RDW: 13.6 % (ref 11.5–15.5)
WBC: 5.2 10*3/uL (ref 4.0–10.5)

## 2014-11-08 LAB — BASIC METABOLIC PANEL WITH GFR
BUN: 17 mg/dL (ref 6–23)
CHLORIDE: 108 meq/L (ref 96–112)
CO2: 27 mEq/L (ref 19–32)
Calcium: 9.2 mg/dL (ref 8.4–10.5)
Creat: 1.09 mg/dL (ref 0.50–1.35)
GFR, EST AFRICAN AMERICAN: 77 mL/min
GFR, Est Non African American: 67 mL/min
Glucose, Bld: 85 mg/dL (ref 70–99)
Potassium: 4.1 mEq/L (ref 3.5–5.3)
SODIUM: 142 meq/L (ref 135–145)

## 2014-11-08 LAB — HEPATIC FUNCTION PANEL
ALT: 11 U/L (ref 0–53)
AST: 14 U/L (ref 0–37)
Albumin: 3.9 g/dL (ref 3.5–5.2)
Alkaline Phosphatase: 50 U/L (ref 39–117)
BILIRUBIN TOTAL: 0.6 mg/dL (ref 0.2–1.2)
Bilirubin, Direct: 0.1 mg/dL (ref 0.0–0.3)
Indirect Bilirubin: 0.5 mg/dL (ref 0.2–1.2)
Total Protein: 6.6 g/dL (ref 6.0–8.3)

## 2014-11-08 LAB — HEMOGLOBIN A1C
Hgb A1c MFr Bld: 5.7 % — ABNORMAL HIGH (ref <5.7)
Mean Plasma Glucose: 117 mg/dL — ABNORMAL HIGH (ref <117)

## 2014-11-08 LAB — LIPID PANEL
CHOL/HDL RATIO: 3.5 ratio
Cholesterol: 156 mg/dL (ref 0–200)
HDL: 45 mg/dL (ref >39)
LDL Cholesterol: 94 mg/dL (ref 0–99)
Triglycerides: 83 mg/dL (ref <150)
VLDL: 17 mg/dL (ref 0–40)

## 2014-11-08 LAB — MAGNESIUM: MAGNESIUM: 2.2 mg/dL (ref 1.5–2.5)

## 2014-11-08 LAB — TSH: TSH: 0.871 u[IU]/mL (ref 0.350–4.500)

## 2014-11-08 NOTE — Progress Notes (Signed)
Assessment and Plan:  Hypertension: Continue medication, monitor blood pressure at home. Continue DASH diet.  Reminder to go to the ER if any CP, SOB, nausea, dizziness, severe HA, changes vision/speech, left arm numbness and tingling, and jaw pain. Cholesterol: Continue diet and exercise. Check cholesterol.  Pre-diabetes-Continue diet and exercise. Check A1C Vitamin D Def- check level and continue medications.   Continue diet and meds as discussed. Further disposition pending results of labs.  HPI 74 y.o. male  presents for 3 month follow up with hypertension, hyperlipidemia, prediabetes and vitamin D.  His blood pressure has been controlled at home. Runs 130's at home and has not had his BP pill this AM, today their BP is BP: 140/78 mmHg  He does not workout but does bowl. He denies chest pain, shortness of breath, dizziness.  He is on cholesterol medication, pravastatin and denies myalgias. His cholesterol is not at goal. The cholesterol last visit was:   Lab Results  Component Value Date   CHOL 167 07/26/2014   HDL 42 07/26/2014   LDLCALC 112* 07/26/2014   TRIG 65 07/26/2014   CHOLHDL 4.0 07/26/2014   He has been working on diet and exercise for prediabetes, and denies paresthesia of the feet, polydipsia, polyuria and visual disturbances. Last A1C in the office was:  Lab Results  Component Value Date   HGBA1C 5.9* 07/26/2014  Patient is on Vitamin D supplement.   Lab Results  Component Value Date   VD25OH 84 07/26/2014    Current Medications:  Current Outpatient Prescriptions on File Prior to Visit  Medication Sig Dispense Refill  . BABY ASPIRIN PO Take 81 mg by mouth daily.    . benazepril-hydrochlorthiazide (LOTENSIN HCT) 20-25 MG per tablet TAKE 1 TABLET EVERY DAY  FOR  BLOOD  PRESSURE 90 tablet 3  . Cholecalciferol (VITAMIN D PO) Take 5,000 Int'l Units by mouth daily.    Marland Kitchen omeprazole (PRILOSEC) 20 MG capsule Take 20 mg by mouth daily.    . pravastatin (PRAVACHOL) 40 MG  tablet TAKE 1 TABLET AT BEDTIME  FOR  CHOLESTEROL 90 tablet 3   No current facility-administered medications on file prior to visit.   Medical History:  Past Medical History  Diagnosis Date  . Hyperlipidemia   . Hypertension   . Prediabetes   . Vitamin D deficiency   . Diverticulosis    Allergies:  Allergies  Allergen Reactions  . Penicillins   . 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   HENT: Negative.   Eyes: Negative.   Respiratory: Negative.   Cardiovascular: Positive for leg swelling (occ with standing). Negative for chest pain, palpitations, orthopnea, claudication and PND.  Gastrointestinal: Negative.   Genitourinary: Negative.   Musculoskeletal: Negative.   Skin: Negative.   Neurological: Negative.   Endo/Heme/Allergies: Negative.   Psychiatric/Behavioral: Negative.     Family history- Review and unchanged Social history- Review and unchanged Physical Exam: BP 140/78 mmHg  Pulse 68  Temp(Src) 98.4 F (36.9 C)  Resp 16  Ht 6' 0.5" (1.842 m)  Wt 187 lb (84.823 kg)  BMI 25.00 kg/m2 Wt Readings from Last 3 Encounters:  11/08/14 187 lb (84.823 kg)  07/26/14 188 lb 3.2 oz (85.367 kg)  05/22/14 187 lb (84.823 kg)   General Appearance: Well nourished, in no apparent distress. Eyes: PERRLA, EOMs, conjunctiva no swelling or erythema Sinuses: No Frontal/maxillary tenderness ENT/Mouth: Ext  aud canals clear, TMs without erythema, bulging. No erythema, swelling, or exudate on post pharynx.  Tonsils not swollen or erythematous. Hearing normal.  Neck: Supple, thyroid normal.  Respiratory: Respiratory effort normal, BS equal bilaterally without rales, rhonchi, wheezing or stridor.  Cardio: RRR with no MRGs. Brisk peripheral pulses without edema.  Abdomen: Soft, + BS,  Non tender, no guarding, rebound, hernias, masses. Lymphatics: Non tender without lymphadenopathy.   Musculoskeletal: Full ROM, 5/5 strength, Normal gait Skin: Warm, dry without rashes, lesions, ecchymosis.  Neuro: Cranial nerves intact. Normal muscle tone, no cerebellar symptoms. Psych: Awake and oriented X 3, normal affect, Insight and Judgment appropriate.    Vicie Mutters, PA-C 9:15 AM [facility]

## 2014-11-08 NOTE — Patient Instructions (Signed)
Benefiber is good for constipation/diarrhea/irritable bowel syndrome, it helps with weight loss and can help lower your bad cholesterol. Please do 1-2 TBSP in the morning in water, coffee, or tea. It can take up to a month before you can see a difference with your bowel movements. It is cheapest from [employer], [employer], [employer].  Your LDL is not in range, 07/26/2014: LDL (calc) 112*. Your LDL is the bad cholesterol that can lead to heart attack and stroke. To lower your number you can decrease your fatty foods, red meat, cheese, milk and increase fiber like whole grains and veggies. You can also add a fiber supplement like Metamucil or Benefiber.      Bad carbs also include fruit juice, alcohol, and sweet tea. These are empty calories that do not signal to your brain that you are full.   Please remember the good carbs are still carbs which convert into sugar. So please measure them out no more than 1/2-1 cup of rice, oatmeal, pasta, and beans  Veggies are however free foods! Pile them on.   Not all fruit is created equal. Please see the list below, the fruit at the bottom is higher in sugars than the fruit at the top. Please avoid all dried fruits.

## 2014-11-09 LAB — VITAMIN D 25 HYDROXY (VIT D DEFICIENCY, FRACTURES): VIT D 25 HYDROXY: 66 ng/mL (ref 30–100)

## 2015-01-31 ENCOUNTER — Ambulatory Visit: Payer: Self-pay | Admitting: Internal Medicine

## 2015-02-08 ENCOUNTER — Encounter: Payer: Self-pay | Admitting: Gastroenterology

## 2015-02-09 ENCOUNTER — Encounter: Payer: Self-pay | Admitting: Internal Medicine

## 2015-02-09 ENCOUNTER — Ambulatory Visit (INDEPENDENT_AMBULATORY_CARE_PROVIDER_SITE_OTHER): Payer: Commercial Managed Care - HMO | Admitting: Internal Medicine

## 2015-02-09 VITALS — BP 138/80 | HR 64 | Temp 97.9°F | Resp 16 | Ht 72.0 in | Wt 187.0 lb

## 2015-02-09 DIAGNOSIS — Z79899 Other long term (current) drug therapy: Secondary | ICD-10-CM | POA: Diagnosis not present

## 2015-02-09 DIAGNOSIS — I10 Essential (primary) hypertension: Secondary | ICD-10-CM

## 2015-02-09 DIAGNOSIS — E559 Vitamin D deficiency, unspecified: Secondary | ICD-10-CM

## 2015-02-09 DIAGNOSIS — R7309 Other abnormal glucose: Secondary | ICD-10-CM | POA: Diagnosis not present

## 2015-02-09 DIAGNOSIS — R7303 Prediabetes: Secondary | ICD-10-CM

## 2015-02-09 DIAGNOSIS — K219 Gastro-esophageal reflux disease without esophagitis: Secondary | ICD-10-CM

## 2015-02-09 DIAGNOSIS — E785 Hyperlipidemia, unspecified: Secondary | ICD-10-CM

## 2015-02-09 LAB — LIPID PANEL
Cholesterol: 168 mg/dL (ref 0–200)
HDL: 52 mg/dL (ref >=40)
LDL Cholesterol: 101 mg/dL — ABNORMAL HIGH (ref 0–99)
Total CHOL/HDL Ratio: 3.2 Ratio
Triglycerides: 77 mg/dL (ref <150)
VLDL: 15 mg/dL (ref 0–40)

## 2015-02-09 LAB — CBC WITH DIFFERENTIAL/PLATELET
BASOS PCT: 1 % (ref 0–1)
Basophils Absolute: 0.1 10*3/uL (ref 0.0–0.1)
EOS ABS: 0.1 10*3/uL (ref 0.0–0.7)
EOS PCT: 1 % (ref 0–5)
HCT: 42.4 % (ref 39.0–52.0)
HEMOGLOBIN: 13.9 g/dL (ref 13.0–17.0)
Lymphocytes Relative: 52 % — ABNORMAL HIGH (ref 12–46)
Lymphs Abs: 3 10*3/uL (ref 0.7–4.0)
MCH: 29.5 pg (ref 26.0–34.0)
MCHC: 32.8 g/dL (ref 30.0–36.0)
MCV: 90 fL (ref 78.0–100.0)
MONO ABS: 0.5 10*3/uL (ref 0.1–1.0)
MONOS PCT: 9 % (ref 3–12)
MPV: 12.5 fL — AB (ref 8.6–12.4)
Neutro Abs: 2.1 10*3/uL (ref 1.7–7.7)
Neutrophils Relative %: 37 % — ABNORMAL LOW (ref 43–77)
PLATELETS: 175 10*3/uL (ref 150–400)
RBC: 4.71 MIL/uL (ref 4.22–5.81)
RDW: 14.5 % (ref 11.5–15.5)
WBC: 5.7 10*3/uL (ref 4.0–10.5)

## 2015-02-09 LAB — HEPATIC FUNCTION PANEL
ALBUMIN: 4.1 g/dL (ref 3.5–5.2)
ALT: 17 U/L (ref 0–53)
AST: 16 U/L (ref 0–37)
Alkaline Phosphatase: 55 U/L (ref 39–117)
Bilirubin, Direct: 0.1 mg/dL (ref 0.0–0.3)
Indirect Bilirubin: 0.3 mg/dL (ref 0.2–1.2)
Total Bilirubin: 0.4 mg/dL (ref 0.2–1.2)
Total Protein: 7 g/dL (ref 6.0–8.3)

## 2015-02-09 LAB — MAGNESIUM: Magnesium: 2.1 mg/dL (ref 1.5–2.5)

## 2015-02-09 LAB — BASIC METABOLIC PANEL WITH GFR
BUN: 16 mg/dL (ref 6–23)
CALCIUM: 9.6 mg/dL (ref 8.4–10.5)
CHLORIDE: 106 meq/L (ref 96–112)
CO2: 27 meq/L (ref 19–32)
Creat: 1.14 mg/dL (ref 0.50–1.35)
GFR, Est African American: 73 mL/min
GFR, Est Non African American: 63 mL/min
Glucose, Bld: 89 mg/dL (ref 70–99)
POTASSIUM: 3.9 meq/L (ref 3.5–5.3)
Sodium: 141 mEq/L (ref 135–145)

## 2015-02-09 LAB — TSH: TSH: 1.13 u[IU]/mL (ref 0.350–4.500)

## 2015-02-09 LAB — HEMOGLOBIN A1C
Hgb A1c MFr Bld: 5.8 % — ABNORMAL HIGH (ref <5.7)
MEAN PLASMA GLUCOSE: 120 mg/dL — AB (ref <117)

## 2015-02-09 NOTE — Progress Notes (Signed)
Patient ID: Javier [name], male   DOB: 05-Jan-1941, 74 y.o.   MRN: [id number]   This very nice 74 y.o. MBM presents for 3 month follow up with Hypertension, Hyperlipidemia, Pre-Diabetes and Vitamin D Deficiency. Patient also has GERD controlled with his meds.   Patient is treated for HTN & BP has been controlled at home. Today's BP: 138/80 mmHg. Patient has had no complaints of any cardiac type chest pain, palpitations, dyspnea/orthopnea/PND, dizziness, claudication, or dependent edema.   Hyperlipidemia is controlled with diet & meds. Patient denies myalgias or other med SE's. Last Lipids were at goal - Total Chol 156; HDL 45; LDL  94; Trig 83 on 11/08/2014:   Also, the patient has history of PreDiabetes since Dec 2010 with A1c 5.8% and has had no symptoms of reactive hypoglycemia, diabetic polys, paresthesias or visual blurring.  Last A1c was 5.7% on 11/08/2014.   Further, the patient also has history of Vitamin D Deficiency of 50 on treatment  in 2008 and supplements vitamin D without any suspected side-effects. Last vitamin D was 66 on 11/08/2014.    Medication Sig  . BABY ASPIRIN PO Take 81 mg  daily.  . benazepril-hctz 20-25 MG  TAKE 1 TAB EVERY DAY   . VITAMIN D Take 5,000 Int'l Units  daily.  Marland Kitchen omeprazole  20 MG  Take 20 mg  daily.  . pravastatin  40 MG  TAKE 1 TAB AT BEDTIME    Allergies  Allergen Reactions  . Penicillins   . Ppd [Tuberculin Purified Protein Derivative]     Positive test in 2004   PMHx:   Past Medical History  Diagnosis Date  . Hyperlipidemia   . Hypertension   . Prediabetes   . Vitamin D deficiency   . Diverticulosis    Immunization History  Administered Date(s) Administered  . Influenza Split 07/28/2013  . Pneumococcal-Unspecified 07/01/1999  . Td 09/20/2009  . Zoster 11/08/2009   Past Surgical History  Procedure Laterality Date  . No past surgeries     FHx:    Reviewed / unchanged  SHx:    Reviewed / unchanged  Systems Review:  Constitutional:  Denies fever, chills, wt changes, headaches, insomnia, fatigue, night sweats, change in appetite. Eyes: Denies redness, blurred vision, diplopia, discharge, itchy, watery eyes.  ENT: Denies discharge, congestion, post nasal drip, epistaxis, sore throat, earache, hearing loss, dental pain, tinnitus, vertigo, sinus pain, snoring.  CV: Denies chest pain, palpitations, irregular heartbeat, syncope, dyspnea, diaphoresis, orthopnea, PND, claudication or edema. Respiratory: denies cough, dyspnea, DOE, pleurisy, hoarseness, laryngitis, wheezing.  Gastrointestinal: Denies dysphagia, odynophagia, heartburn, reflux, water brash, abdominal pain or cramps, nausea, vomiting, bloating, diarrhea, constipation, hematemesis, melena, hematochezia  or hemorrhoids. Genitourinary: Denies dysuria, frequency, urgency, nocturia, hesitancy, discharge, hematuria or flank pain. Musculoskeletal: Denies arthralgias, myalgias, stiffness, jt. swelling, pain, limping or strain/sprain.  Skin: Denies pruritus, rash, hives, warts, acne, eczema or change in skin lesion(s). Neuro: No weakness, tremor, incoordination, spasms, paresthesia or pain. Psychiatric: Denies confusion, memory loss or sensory loss. Endo: Denies change in weight, skin or hair change.  Heme/Lymph: No excessive bleeding, bruising or enlarged lymph nodes.  Physical Exam  BP 138/80   Pulse 64  Temp 97.9 F   Resp 16  Ht 6'   Wt 187 lb     BMI 25.36   Appears well nourished and in no distress. Eyes: PERRLA, EOMs, conjunctiva no swelling or erythema. Sinuses: No frontal/maxillary tenderness ENT/Mouth: EAC's clear, TM's nl w/o erythema, bulging. Nares clear w/o  erythema, swelling, exudates. Oropharynx clear without erythema or exudates. Oral hygiene is good. Tongue normal, non obstructing. Hearing intact.  Neck: Supple. Thyroid nl. Car 2+/2+ without bruits, nodes or JVD. Chest: Respirations nl with BS clear & equal w/o rales, rhonchi, wheezing or stridor.   Cor: Heart sounds normal w/ regular rate and rhythm without sig. murmurs, gallops, clicks, or rubs. Peripheral pulses normal and equal  without edema.  Abdomen: Soft & bowel sounds normal. Non-tender w/o guarding, rebound, hernias, masses, or organomegaly.  Lymphatics: Unremarkable.  Musculoskeletal: Full ROM all peripheral extremities, joint stability, 5/5 strength, and normal gait.  Skin: Warm, dry without exposed rashes, lesions or ecchymosis apparent.  Neuro: Cranial nerves intact, reflexes equal bilaterally. Sensory-motor testing grossly intact. Tendon reflexes grossly intact.  Pysch: Alert & oriented x 3.  Insight and judgement nl & appropriate. No ideations.  Assessment and Plan:  1. Essential hypertension  - TSH  2. Hyperlipidemia  - Lipid panel  3. Prediabetes  - Hemoglobin A1c - Insulin, random  4. Vitamin D deficiency  - Vit D  25 hydroxy   5. Gastroesophageal reflux disease   6. Medication management  - CBC with Differential/Platelet - BASIC METABOLIC PANEL WITH GFR - Hepatic function panel - Magnesium   Recommended regular exercise, BP monitoring, weight control, and discussed med and SE's. Recommended labs to assess and monitor clinical status. Further disposition pending results of labs. Over 30 minutes of exam, counseling, chart review was performed

## 2015-02-09 NOTE — Patient Instructions (Signed)

## 2015-02-10 LAB — INSULIN, RANDOM: INSULIN: 4.9 u[IU]/mL (ref 2.0–19.6)

## 2015-02-10 LAB — VITAMIN D 25 HYDROXY (VIT D DEFICIENCY, FRACTURES): VIT D 25 HYDROXY: 64 ng/mL (ref 30–100)

## 2015-05-15 ENCOUNTER — Encounter: Payer: Self-pay | Admitting: Internal Medicine

## 2015-05-22 ENCOUNTER — Ambulatory Visit (INDEPENDENT_AMBULATORY_CARE_PROVIDER_SITE_OTHER): Payer: Commercial Managed Care - HMO | Admitting: Internal Medicine

## 2015-05-22 ENCOUNTER — Encounter: Payer: Self-pay | Admitting: Internal Medicine

## 2015-05-22 VITALS — BP 152/78 | HR 68 | Temp 98.2°F | Resp 16 | Ht 72.5 in | Wt 185.0 lb

## 2015-05-22 DIAGNOSIS — Z79899 Other long term (current) drug therapy: Secondary | ICD-10-CM | POA: Diagnosis not present

## 2015-05-22 DIAGNOSIS — R7309 Other abnormal glucose: Secondary | ICD-10-CM | POA: Diagnosis not present

## 2015-05-22 DIAGNOSIS — I10 Essential (primary) hypertension: Secondary | ICD-10-CM

## 2015-05-22 DIAGNOSIS — E559 Vitamin D deficiency, unspecified: Secondary | ICD-10-CM

## 2015-05-22 DIAGNOSIS — E785 Hyperlipidemia, unspecified: Secondary | ICD-10-CM

## 2015-05-22 DIAGNOSIS — R7303 Prediabetes: Secondary | ICD-10-CM

## 2015-05-22 LAB — CBC WITH DIFFERENTIAL/PLATELET
Basophils Absolute: 0.1 10*3/uL (ref 0.0–0.1)
Basophils Relative: 1 % (ref 0–1)
EOS PCT: 1 % (ref 0–5)
Eosinophils Absolute: 0.1 10*3/uL (ref 0.0–0.7)
HEMATOCRIT: 41.1 % (ref 39.0–52.0)
HEMOGLOBIN: 13.5 g/dL (ref 13.0–17.0)
LYMPHS ABS: 3.8 10*3/uL (ref 0.7–4.0)
LYMPHS PCT: 59 % — AB (ref 12–46)
MCH: 29.6 pg (ref 26.0–34.0)
MCHC: 32.8 g/dL (ref 30.0–36.0)
MCV: 90.1 fL (ref 78.0–100.0)
MONO ABS: 0.5 10*3/uL (ref 0.1–1.0)
MPV: 11.1 fL (ref 8.6–12.4)
Monocytes Relative: 8 % (ref 3–12)
Neutro Abs: 2 10*3/uL (ref 1.7–7.7)
Neutrophils Relative %: 31 % — ABNORMAL LOW (ref 43–77)
Platelets: 182 10*3/uL (ref 150–400)
RBC: 4.56 MIL/uL (ref 4.22–5.81)
RDW: 14.1 % (ref 11.5–15.5)
WBC: 6.5 10*3/uL (ref 4.0–10.5)

## 2015-05-22 LAB — LIPID PANEL
Cholesterol: 178 mg/dL (ref 125–200)
HDL: 47 mg/dL (ref >=40)
LDL CALC: 115 mg/dL (ref <130)
Total CHOL/HDL Ratio: 3.8 Ratio (ref <=5.0)
Triglycerides: 79 mg/dL (ref <150)
VLDL: 16 mg/dL (ref <30)

## 2015-05-22 LAB — BASIC METABOLIC PANEL WITH GFR
BUN: 14 mg/dL (ref 7–25)
CALCIUM: 10.1 mg/dL (ref 8.6–10.3)
CO2: 26 mmol/L (ref 20–31)
Chloride: 105 mmol/L (ref 98–110)
Creat: 1.06 mg/dL (ref 0.70–1.18)
GFR, Est African American: 80 mL/min (ref >=60)
GFR, Est Non African American: 69 mL/min (ref >=60)
GLUCOSE: 72 mg/dL (ref 65–99)
POTASSIUM: 4.3 mmol/L (ref 3.5–5.3)
Sodium: 141 mmol/L (ref 135–146)

## 2015-05-22 LAB — TSH: TSH: 0.872 u[IU]/mL (ref 0.350–4.500)

## 2015-05-22 LAB — HEPATIC FUNCTION PANEL
ALK PHOS: 50 U/L (ref 40–115)
ALT: 13 U/L (ref 9–46)
AST: 16 U/L (ref 10–35)
Albumin: 4.1 g/dL (ref 3.6–5.1)
BILIRUBIN INDIRECT: 0.3 mg/dL (ref 0.2–1.2)
Bilirubin, Direct: 0.1 mg/dL (ref <=0.2)
TOTAL PROTEIN: 6.7 g/dL (ref 6.1–8.1)
Total Bilirubin: 0.4 mg/dL (ref 0.2–1.2)

## 2015-05-22 LAB — MAGNESIUM: Magnesium: 2.1 mg/dL (ref 1.5–2.5)

## 2015-05-22 NOTE — Progress Notes (Signed)
Patient ID: Javier [name], male   DOB: 10/27/1940, 74 y.o.   MRN: [id number]  Assessment and Plan:  Hypertension:  -mildly increased today.  Normal blood pressure at home per patient report is in 130/78.  Will continue to follow at home.  Patient to call if BP is 150/90 or greater. -Continue medication,  -monitor blood pressure at home.  -Continue DASH diet.   -Reminder to go to the ER if any CP, SOB, nausea, dizziness, severe HA, changes vision/speech, left arm numbness and tingling, and jaw pain.  Cholesterol: -Continue diet and exercise.  -Check cholesterol.   Pre-diabetes: -Continue diet and exercise.  -Check A1C  Vitamin D Def: -check level -continue medications.   Continue diet and meds as discussed. Further disposition pending results of labs.  HPI 74 y.o. male  presents for 3 month follow up with hypertension, hyperlipidemia, prediabetes and vitamin D.   His blood pressure has been controlled at home, today their BP is BP: (!) 152/78 mmHg.   He does not workout. He denies chest pain, shortness of breath, dizziness.  He occasionally walks and does stuff around the house but no formal routine.     He is on cholesterol medication and denies myalgias. His cholesterol is at goal. The cholesterol last visit was:   Lab Results  Component Value Date   CHOL 168 02/09/2015   HDL 52 02/09/2015   LDLCALC 101* 02/09/2015   TRIG 77 02/09/2015   CHOLHDL 3.2 02/09/2015     He has been working on diet and exercise for prediabetes, and denies foot ulcerations, hyperglycemia, hypoglycemia , increased appetite, nausea, paresthesia of the feet, polydipsia, polyuria, visual disturbances, vomiting and weight loss. Last A1C in the office was:  Lab Results  Component Value Date   HGBA1C 5.8* 02/09/2015    Patient is on Vitamin D supplement.  Lab Results  Component Value Date   VD25OH 64 02/09/2015      Current Medications:  Current Outpatient Prescriptions on File Prior to Visit   Medication Sig Dispense Refill  . BABY ASPIRIN PO Take 81 mg by mouth daily.    . benazepril-hydrochlorthiazide (LOTENSIN HCT) 20-25 MG per tablet TAKE 1 TABLET EVERY DAY  FOR  BLOOD  PRESSURE 90 tablet 3  . Cholecalciferol (VITAMIN D PO) Take 5,000 Int'l Units by mouth daily.    Marland Kitchen omeprazole (PRILOSEC) 20 MG capsule Take 20 mg by mouth daily.    . pravastatin (PRAVACHOL) 40 MG tablet TAKE 1 TABLET AT BEDTIME  FOR  CHOLESTEROL 90 tablet 3   No current facility-administered medications on file prior to visit.    Medical History:  Past Medical History  Diagnosis Date  . Hyperlipidemia   . Hypertension   . Prediabetes   . Vitamin D deficiency   . Diverticulosis     Allergies:  Allergies  Allergen Reactions  . Penicillins   . Ppd [Tuberculin Purified Protein Derivative]     Positive test in 2004     Review of Systems:  Review of Systems  Constitutional: Negative for fever, chills and malaise/fatigue.  HENT: Negative for congestion, ear pain and sore throat.   Eyes: Negative.   Respiratory: Negative for cough, shortness of breath and wheezing.   Cardiovascular: Negative for chest pain, palpitations and leg swelling.  Gastrointestinal: Negative for diarrhea, constipation, blood in stool and melena.  Genitourinary: Negative.   Skin: Negative.   Neurological: Negative for dizziness, loss of consciousness and headaches.  Psychiatric/Behavioral: Negative for depression. The patient  is not nervous/anxious and does not have insomnia.     Family history- Review and unchanged  Social history- Review and unchanged  Physical Exam: BP 152/78 mmHg  Pulse 68  Temp(Src) 98.2 F (36.8 C) (Temporal)  Resp 16  Ht 6' 0.5" (1.842 m)  Wt 185 lb (83.915 kg)  BMI 24.73 kg/m2 Wt Readings from Last 3 Encounters:  05/22/15 185 lb (83.915 kg)  02/09/15 187 lb (84.823 kg)  11/08/14 187 lb (84.823 kg)    General Appearance: Well nourished well developed, in no apparent distress. Eyes:  PERRLA, EOMs, conjunctiva no swelling or erythema ENT/Mouth: Ear canals normal without obstruction, swelling, erythma, discharge.  TMs normal bilaterally.  Oropharynx moist, clear, without exudate, or postoropharyngeal swelling. Neck: Supple, thyroid normal,no cervical adenopathy  Respiratory: Respiratory effort normal, Breath sounds clear A&P without rhonchi, wheeze, or rale.  No retractions, no accessory usage. Cardio: RRR with no MRGs. Brisk peripheral pulses without edema.  Abdomen: Soft, + BS,  Non tender, no guarding, rebound, hernias, masses. Musculoskeletal: Full ROM, 5/5 strength, Normal gait Skin: Warm, dry without rashes, lesions, ecchymosis.  Neuro: Awake and oriented X 3, Cranial nerves intact. Normal muscle tone, no cerebellar symptoms. Psych: Normal affect, Insight and Judgment appropriate.    Starlyn Skeans, PA-C 10:54 AM [facility]

## 2015-05-23 LAB — INSULIN, RANDOM: INSULIN: 7.8 u[IU]/mL (ref 2.0–19.6)

## 2015-05-23 LAB — HEMOGLOBIN A1C
Hgb A1c MFr Bld: 5.7 % — ABNORMAL HIGH (ref <5.7)
MEAN PLASMA GLUCOSE: 117 mg/dL — AB (ref <117)

## 2015-05-23 LAB — VITAMIN D 25 HYDROXY (VIT D DEFICIENCY, FRACTURES): Vit D, 25-Hydroxy: 69 ng/mL (ref 30–100)

## 2015-08-06 ENCOUNTER — Encounter: Payer: Self-pay | Admitting: Internal Medicine

## 2015-09-11 ENCOUNTER — Encounter: Payer: Self-pay | Admitting: Internal Medicine

## 2015-09-11 ENCOUNTER — Ambulatory Visit (INDEPENDENT_AMBULATORY_CARE_PROVIDER_SITE_OTHER): Payer: Commercial Managed Care - HMO | Admitting: Internal Medicine

## 2015-09-11 VITALS — BP 150/82 | HR 64 | Temp 97.7°F | Resp 16 | Ht 72.5 in | Wt 194.0 lb

## 2015-09-11 DIAGNOSIS — Z1212 Encounter for screening for malignant neoplasm of rectum: Secondary | ICD-10-CM

## 2015-09-11 DIAGNOSIS — R6889 Other general symptoms and signs: Secondary | ICD-10-CM

## 2015-09-11 DIAGNOSIS — E559 Vitamin D deficiency, unspecified: Secondary | ICD-10-CM

## 2015-09-11 DIAGNOSIS — R7303 Prediabetes: Secondary | ICD-10-CM

## 2015-09-11 DIAGNOSIS — K219 Gastro-esophageal reflux disease without esophagitis: Secondary | ICD-10-CM

## 2015-09-11 DIAGNOSIS — R7309 Other abnormal glucose: Secondary | ICD-10-CM | POA: Diagnosis not present

## 2015-09-11 DIAGNOSIS — Z0001 Encounter for general adult medical examination with abnormal findings: Secondary | ICD-10-CM | POA: Diagnosis not present

## 2015-09-11 DIAGNOSIS — Z9181 History of falling: Secondary | ICD-10-CM

## 2015-09-11 DIAGNOSIS — I10 Essential (primary) hypertension: Secondary | ICD-10-CM

## 2015-09-11 DIAGNOSIS — E785 Hyperlipidemia, unspecified: Secondary | ICD-10-CM | POA: Diagnosis not present

## 2015-09-11 DIAGNOSIS — Z23 Encounter for immunization: Secondary | ICD-10-CM | POA: Diagnosis not present

## 2015-09-11 DIAGNOSIS — Z789 Other specified health status: Secondary | ICD-10-CM | POA: Diagnosis not present

## 2015-09-11 DIAGNOSIS — Z79899 Other long term (current) drug therapy: Secondary | ICD-10-CM

## 2015-09-11 DIAGNOSIS — Z125 Encounter for screening for malignant neoplasm of prostate: Secondary | ICD-10-CM | POA: Diagnosis not present

## 2015-09-11 DIAGNOSIS — Z1389 Encounter for screening for other disorder: Secondary | ICD-10-CM | POA: Diagnosis not present

## 2015-09-11 DIAGNOSIS — Z1331 Encounter for screening for depression: Secondary | ICD-10-CM

## 2015-09-11 DIAGNOSIS — Z0000 Encounter for general adult medical examination without abnormal findings: Secondary | ICD-10-CM | POA: Insufficient documentation

## 2015-09-11 NOTE — Progress Notes (Signed)
Patient ID: Javier [name], male   DOB: 03-Oct-1940, 74 y.o.   MRN: [id number]  Medicare Annual Wellness Visit and  Comprehensive Evaluation & Examination   Assessment:   1. Essential hypertension  - Microalbumin / creatinine urine ratio - EKG 12-Lead - [place], RETROPERITNL ABD,  LTD - TSH  2. Hyperlipidemia  - Lipid panel - TSH  3. Prediabetes  - Hemoglobin A1c - Insulin, random  4. Vitamin D deficiency  - VITAMIN D 25 Hydroxy   5. Gastroesophageal reflux disease   6. Screening for rectal cancer  - POC Hemoccult Bld/Stl   7. Prostate cancer screening  - PSA  8. Depression screen   9. At low risk for fall   10. Need for prophylactic vaccination and inoculation against influenza  - Flu vaccine HIGH DOSE PF (Fluzone High dose)  11. Encounter for Medicare annual wellness exam   12. Medication management  - Urinalysis, Routine w reflex microscopic  - CBC with Differential/Platelet - BASIC METABOLIC PANEL WITH GFR - Hepatic function panel - Magnesium  13. Encounter for general adult medical examination with abnormal findings  Plan:   During the course of the visit the patient was educated and counseled about appropriate screening and preventive services including:    Pneumococcal vaccine   Influenza vaccine  Td vaccine  Screening electrocardiogram  Bone densitometry screening  Colorectal cancer screening  Diabetes screening  Glaucoma screening  Nutrition counseling   Advanced directives: requested  Screening recommendations, referrals: Vaccinations: Immunization History  Administered Date(s) Administered  . Influenza Split 07/28/2013  . Influenza, High Dose Seasonal PF 09/11/2015  . Pneumococcal-Unspecified 07/01/1999, 09/20/2009  . Td 09/20/2009  . Zoster 11/08/2009  Prevnar vaccine declined Hep B vaccine not indicated  Nutrition assessed and recommended  Colonoscopy 05/22/2014 (due 04/2017)  Recommended yearly  ophthalmology/optometry visit for glaucoma screening and checkup Recommended yearly dental visit for hygiene and checkup Advanced directives - Yes  Conditions/risks identified: BMI: Discussed weight loss, diet, and increase physical activity.  Increase physical activity: AHA recommends 150 minutes of physical activity a week.  Medications reviewed Pre Diabetes is not at goal, ACE/ARB therapy: Yes. Urinary Incontinence is not an issue: discussed non pharmacology and pharmacology options.  Fall risk: low- discussed PT, home fall assessment, medications.   Subjective:    Javier [name]  presents for [employer] Visit and presents for a comprehensive evaluation, examination and management of multiple medical co-morbidities.  Date of last medicare wellness visit was 02/16/2014.  This very nice 74 y.o. MBM presents for  follow up with Hypertension, Hyperlipidemia, Pre-Diabetes and Vitamin D Deficiency.    Patient is treated for HTN since 2004  & BP has been controlled at home. Today's BP is sl elevated at 150/82 & dropped to 142/80 on recheck. . Patient has had no complaints of any cardiac type chest pain, palpitations, dyspnea/orthopnea/PND, dizziness, claudication, or dependent edema.   Hyperlipidemia is not controlled with diet & meds. Patient denies myalgias or other med SE's. Last Lipids were  Cholesterol 178; HDL 47; LDL 115; Triglycerides 79 on 05/22/2015.   Also, the patient has history of PreDiabetes since 2010 with A1c 5.8% and has had no symptoms of reactive hypoglycemia, diabetic polys, paresthesias or visual blurring.  Last A1c was 5.7% on 05/22/2015.     Further, the patient also has history of Vitamin D Deficiency and supplements vitamin D without any suspected side-effects. Last vitamin D was 69 on 05/22/2015.  Names of Other Physician/Practitioners you currently use:  1. [facility] here for primary care 2. Dr Melissa Noon, [place], eye  doctor, last visit 2014 7 reminded to schedule f/u. 3. Dr Wynetta Emery, [place], dentist, last visit Oct 2016 and ebvery 6 months  Patient Care Team: Unk Pinto, MD as PCP - General (Internal Medicine) Inda Castle, MD as Consulting Physician (Gastroenterology) Melissa Noon, OD as Referring Physician (Optometry)  Medication Review: Current Outpatient Prescriptions on File Prior to Visit  Medication Sig Dispense Refill  . BABY ASPIRIN PO Take 81 mg by mouth daily.    . benazepril-hydrochlorthiazide (LOTENSIN HCT) 20-25 MG per tablet TAKE 1 TABLET EVERY DAY  FOR  BLOOD  PRESSURE 90 tablet 3  . Cholecalciferol (VITAMIN D PO) Take 5,000 Int'l Units by mouth daily.    Marland Kitchen omeprazole (PRILOSEC) 20 MG capsule Take 20 mg by mouth daily.    . pravastatin (PRAVACHOL) 40 MG tablet TAKE 1 TABLET AT BEDTIME  FOR  CHOLESTEROL 90 tablet 3   No current facility-administered medications on file prior to visit.    Allergies  Allergen Reactions  . Penicillins   . Ppd [Tuberculin Purified Protein Derivative]     Positive test in 2004    Current Problems (verified) Patient Active Problem List   Diagnosis Date Noted  . Encounter for Medicare annual wellness exam 09/11/2015  . GERD  02/09/2015  . Medication management 11/02/2013  . Hyperlipidemia   . Hypertension   . Prediabetes   . Vitamin D deficiency   . Diverticulosis     Screening Tests Health Maintenance  Topic Date Due  . PNA vac Low Risk Adult (1 of 2 - PCV13) 01/29/2006  . INFLUENZA VACCINE  04/30/2015  . COLONOSCOPY  05/22/2017  . TETANUS/TDAP  09/21/2019  . ZOSTAVAX  Completed    Immunization History  Administered Date(s) Administered  . Influenza Split 07/28/2013  . Influenza, High Dose Seasonal PF 09/11/2015  . Pneumococcal-Unspecified 07/01/1999, 09/20/2009  . Td 09/20/2009  . Zoster 11/08/2009    Preventative care: Last colonoscopy: 05/23/2014 & due 04/2017)   Past Medical History  Diagnosis Date  . Hyperlipidemia    . Hypertension   . Prediabetes   . Vitamin D deficiency   . Diverticulosis     Past Surgical History  Procedure Laterality Date  . No past surgeries      Risk Factors: Tobacco Social History  Substance Use Topics  . Smoking status: Former Smoker    Types: Cigarettes    Quit date: 01/17/2003  . Smokeless tobacco: Never Used     Comment: Occasional cigars.  . Alcohol Use: No     Comment: rarely   He does not smoke.  Patient is a former smoker. Are there smokers in your home (other than you)?  No  Alcohol Current alcohol use: none  Caffeine Current caffeine use: coffee 2 cups /day  Exercise Current exercise: walking and bowling  Nutrition/Diet Current diet: in general, a "healthy" diet    Cardiac risk factors: advanced age (older than 29 for men, 72 for women), dyslipidemia, hypertension, male gender, sedentary lifestyle and smoking/ tobacco exposure.  Depression Screen (Note: if answer to either of the following is "Yes", a more complete depression screening is indicated)   Q1: Over the past two weeks, have you felt down, depressed or hopeless? No  Q2: Over the past two weeks, have you felt little interest or pleasure in doing things? No  Have you lost interest or pleasure in daily life? No  Do you often feel hopeless? No  Do you cry easily over simple problems? No  Activities of Daily Living In your present state of health, do you have any difficulty performing the following activities?:  Driving? No Managing money?  No Feeding yourself? No Getting from bed to chair? No Climbing a flight of stairs? No Preparing food and eating?: No Bathing or showering? No Getting dressed: No Getting to the toilet? No Using the toilet:No Moving around from place to place: No In the past year have you fallen or had a near fall?:No   Are you sexually active?  No  Do you have more than one partner?  No  Vision Difficulties: No  Hearing Difficulties: No Do you often  ask people to speak up or repeat themselves? No Do you experience ringing or noises in your ears? No Do you have difficulty understanding soft or whispered voices? No  Cognition  Do you feel that you have a problem with memory?No  Do you often misplace items? No  Do you feel safe at home?  Yes  Advanced directives Does patient have a Justice? Yes Does patient have a Living Will? Yes  ROS: Constitutional: Denies fever, chills, weight loss/gain, headaches, insomnia, fatigue, night sweats or change in appetite. Eyes: Denies redness, blurred vision, diplopia, discharge, itchy or watery eyes.  ENT: Denies discharge, congestion, post nasal drip, epistaxis, sore throat, earache, hearing loss, dental pain, Tinnitus, Vertigo, Sinus pain or snoring.  Cardio: Denies chest pain, palpitations, irregular heartbeat, syncope, dyspnea, diaphoresis, orthopnea, PND, claudication or edema Respiratory: denies cough, dyspnea, DOE, pleurisy, hoarseness, laryngitis or wheezing.  Gastrointestinal: Denies dysphagia, heartburn, reflux, water brash, pain, cramps, nausea, vomiting, bloating, diarrhea, constipation, hematemesis, melena, hematochezia, jaundice or hemorrhoids Genitourinary: Denies dysuria, frequency, urgency, nocturia, hesitancy, discharge, hematuria or flank pain Musculoskeletal: Denies arthralgia, myalgia, stiffness, Jt. Swelling, pain, limp or strain/sprain. Denies Falls. Skin: Denies puritis, rash, hives, warts, acne, eczema or change in skin lesion Neuro: No weakness, tremor, incoordination, spasms, paresthesia or pain Psychiatric: Denies confusion, memory loss or sensory loss. Denies Depression. Endocrine: Denies change in weight, skin, hair change, nocturia, and paresthesia, diabetic polys, visual blurring or hyper / hypo glycemic episodes.  Heme/Lymph: No excessive bleeding, bruising or enlarged lymph nodes.  Objective:     BP 150/82 mmHg  Pulse 64  Temp(Src) 97.7 F  (36.5 C)  Resp 16  Ht 6' 0.5" (1.842 m)  Wt 194 lb (87.998 kg)  BMI 25.94 kg/m2  General Appearance:  Alert  WD/WN, male  in no apparent distress. Eyes: PERRLA, EOMs nl, conjunctiva normal, normal fundi and vessels. Sinuses: No frontal/maxillary tenderness ENT/Mouth: EACs patent / TMs  nl. Nares clear without erythema, swelling, mucoid exudates. Oral hygiene is good. No erythema, swelling, or exudate. Tongue normal, non-obstructing. Tonsils not swollen or erythematous. Hearing normal.  Neck: Supple, thyroid normal. No bruits, nodes or JVD. Respiratory: Respiratory effort normal.  BS equal and clear bilateral without rales, rhonci, wheezing or stridor. Cardio: Heart sounds are normal with regular rate and rhythm and no murmurs, rubs or gallops. Peripheral pulses are normal and equal bilaterally without edema. No aortic or femoral bruits. Chest: symmetric with normal excursions and percussion.  Abdomen: Flat, soft with nl bowel sounds. Nontender, no guarding, rebound, hernias, masses, or organomegaly.  Lymphatics: Non tender without lymphadenopathy.  Genitourinary: No hernias.Testes nl. DRE - prostate nl for age - smooth & firm w/o nodules. Musculoskeletal: Full ROM all peripheral extremities, joint stability, 5/5 strength, and  normal gait. Skin: Warm and dry without rashes, lesions, cyanosis, clubbing or  ecchymosis.  Neuro: Cranial nerves intact, reflexes equal bilaterally. Normal muscle tone, no cerebellar symptoms. Sensation intact.  Pysch: Alert and oriented X 3 with normal affect, insight and judgment appropriate.   Cognitive Testing  Alert? Yes  Normal Appearance? Yes  Oriented to person? Yes  Place? Yes   Time? Yes  Recall of three objects?  Yes  Can perform simple calculations? Yes  Displays appropriate judgment? Yes  Can read the correct time from a watch/clock? Yes  Medicare Attestation I have personally reviewed: The patient's medical and social history Their use of  alcohol, tobacco or illicit drugs Their current medications and supplements The patient's functional ability including ADLs,fall risks, home safety risks, cognitive, and hearing and visual impairment Diet and physical activities Evidence for depression or mood disorders  The patient's weight, height, BMI, and visual acuity have been recorded in the chart.  I have made referrals, counseling, and provided education to the patient based on review of the above and I have provided the patient with a written personalized care plan for preventive services.  Over 40 minutes of exam, counseling, chart review was performed.  Treasure Ingrum DAVID, MD   09/11/2015

## 2015-09-11 NOTE — Patient Instructions (Signed)

## 2015-09-12 LAB — CBC WITH DIFFERENTIAL/PLATELET
BASOS ABS: 0 10*3/uL (ref 0.0–0.1)
Basophils Relative: 0 % (ref 0–1)
EOS ABS: 0.1 10*3/uL (ref 0.0–0.7)
Eosinophils Relative: 1 % (ref 0–5)
HEMATOCRIT: 43.6 % (ref 39.0–52.0)
HEMOGLOBIN: 14 g/dL (ref 13.0–17.0)
LYMPHS ABS: 5.8 10*3/uL — AB (ref 0.7–4.0)
LYMPHS PCT: 63 % — AB (ref 12–46)
MCH: 29 pg (ref 26.0–34.0)
MCHC: 32.1 g/dL (ref 30.0–36.0)
MCV: 90.3 fL (ref 78.0–100.0)
MPV: 12.6 fL — AB (ref 8.6–12.4)
Monocytes Absolute: 0.6 10*3/uL (ref 0.1–1.0)
Monocytes Relative: 6 % (ref 3–12)
NEUTROS ABS: 2.8 10*3/uL (ref 1.7–7.7)
NEUTROS PCT: 30 % — AB (ref 43–77)
Platelets: 190 10*3/uL (ref 150–400)
RBC: 4.83 MIL/uL (ref 4.22–5.81)
RDW: 14.3 % (ref 11.5–15.5)
WBC: 9.2 10*3/uL (ref 4.0–10.5)

## 2015-09-12 LAB — LIPID PANEL
CHOLESTEROL: 164 mg/dL (ref 125–200)
HDL: 44 mg/dL (ref >=40)
LDL Cholesterol: 89 mg/dL (ref <130)
TRIGLYCERIDES: 153 mg/dL — AB (ref <150)
Total CHOL/HDL Ratio: 3.7 Ratio (ref <=5.0)
VLDL: 31 mg/dL — AB (ref <30)

## 2015-09-12 LAB — URINALYSIS, ROUTINE W REFLEX MICROSCOPIC
BILIRUBIN URINE: NEGATIVE
GLUCOSE, UA: NEGATIVE
Hgb urine dipstick: NEGATIVE
Ketones, ur: NEGATIVE
LEUKOCYTES UA: NEGATIVE
Nitrite: NEGATIVE
PH: 6 (ref 5.0–8.0)
Protein, ur: NEGATIVE
SPECIFIC GRAVITY, URINE: 1.017 (ref 1.001–1.035)

## 2015-09-12 LAB — VITAMIN D 25 HYDROXY (VIT D DEFICIENCY, FRACTURES): VIT D 25 HYDROXY: 85 ng/mL (ref 30–100)

## 2015-09-12 LAB — HEPATIC FUNCTION PANEL
ALBUMIN: 4.1 g/dL (ref 3.6–5.1)
ALT: 17 U/L (ref 9–46)
AST: 18 U/L (ref 10–35)
Alkaline Phosphatase: 60 U/L (ref 40–115)
BILIRUBIN INDIRECT: 0.4 mg/dL (ref 0.2–1.2)
Bilirubin, Direct: 0.1 mg/dL (ref <=0.2)
TOTAL PROTEIN: 6.8 g/dL (ref 6.1–8.1)
Total Bilirubin: 0.5 mg/dL (ref 0.2–1.2)

## 2015-09-12 LAB — BASIC METABOLIC PANEL WITH GFR
BUN: 16 mg/dL (ref 7–25)
CALCIUM: 9.3 mg/dL (ref 8.6–10.3)
CO2: 21 mmol/L (ref 20–31)
CREATININE: 1.11 mg/dL (ref 0.70–1.18)
Chloride: 109 mmol/L (ref 98–110)
GFR, EST AFRICAN AMERICAN: 75 mL/min (ref >=60)
GFR, EST NON AFRICAN AMERICAN: 65 mL/min (ref >=60)
GLUCOSE: 62 mg/dL — AB (ref 65–99)
Potassium: 4 mmol/L (ref 3.5–5.3)
Sodium: 144 mmol/L (ref 135–146)

## 2015-09-12 LAB — MICROALBUMIN / CREATININE URINE RATIO
Creatinine, Urine: 87 mg/dL (ref 20–370)
Microalb, Ur: <0.2 mg/dL

## 2015-09-12 LAB — PSA: PSA: 2.01 ng/mL (ref <=4.00)

## 2015-09-12 LAB — HEMOGLOBIN A1C
HEMOGLOBIN A1C: 5.7 % — AB (ref <5.7)
MEAN PLASMA GLUCOSE: 117 mg/dL — AB (ref <117)

## 2015-09-12 LAB — TSH: TSH: 0.891 u[IU]/mL (ref 0.350–4.500)

## 2015-09-12 LAB — MAGNESIUM: MAGNESIUM: 2.2 mg/dL (ref 1.5–2.5)

## 2015-09-12 LAB — INSULIN, RANDOM: INSULIN: 6.8 u[IU]/mL (ref 2.0–19.6)

## 2015-10-08 ENCOUNTER — Other Ambulatory Visit: Payer: Self-pay | Admitting: *Deleted

## 2015-10-08 DIAGNOSIS — Z1212 Encounter for screening for malignant neoplasm of rectum: Secondary | ICD-10-CM

## 2015-10-08 LAB — POC HEMOCCULT BLD/STL (HOME/3-CARD/SCREEN)
Card #3 Fecal Occult Blood, POC: NEGATIVE
FECAL OCCULT BLD: NEGATIVE
Fecal Occult Blood, POC: NEGATIVE

## 2015-11-27 ENCOUNTER — Other Ambulatory Visit: Payer: Self-pay | Admitting: Internal Medicine

## 2015-12-14 ENCOUNTER — Ambulatory Visit: Payer: Self-pay | Admitting: Internal Medicine

## 2015-12-17 ENCOUNTER — Ambulatory Visit (INDEPENDENT_AMBULATORY_CARE_PROVIDER_SITE_OTHER): Payer: Commercial Managed Care - HMO | Admitting: Internal Medicine

## 2015-12-17 ENCOUNTER — Encounter: Payer: Self-pay | Admitting: Internal Medicine

## 2015-12-17 VITALS — BP 148/82 | HR 60 | Temp 98.2°F | Resp 16 | Ht 72.5 in | Wt 193.0 lb

## 2015-12-17 DIAGNOSIS — K219 Gastro-esophageal reflux disease without esophagitis: Secondary | ICD-10-CM | POA: Diagnosis not present

## 2015-12-17 DIAGNOSIS — I10 Essential (primary) hypertension: Secondary | ICD-10-CM

## 2015-12-17 DIAGNOSIS — E559 Vitamin D deficiency, unspecified: Secondary | ICD-10-CM | POA: Diagnosis not present

## 2015-12-17 DIAGNOSIS — E785 Hyperlipidemia, unspecified: Secondary | ICD-10-CM | POA: Diagnosis not present

## 2015-12-17 DIAGNOSIS — K5790 Diverticulosis of intestine, part unspecified, without perforation or abscess without bleeding: Secondary | ICD-10-CM | POA: Diagnosis not present

## 2015-12-17 DIAGNOSIS — R7303 Prediabetes: Secondary | ICD-10-CM

## 2015-12-17 DIAGNOSIS — Z0001 Encounter for general adult medical examination with abnormal findings: Secondary | ICD-10-CM | POA: Diagnosis not present

## 2015-12-17 DIAGNOSIS — Z0000 Encounter for general adult medical examination without abnormal findings: Secondary | ICD-10-CM

## 2015-12-17 DIAGNOSIS — R6889 Other general symptoms and signs: Secondary | ICD-10-CM | POA: Diagnosis not present

## 2015-12-17 DIAGNOSIS — Z79899 Other long term (current) drug therapy: Secondary | ICD-10-CM | POA: Diagnosis not present

## 2015-12-17 MED ORDER — MOMETASONE FUROATE 50 MCG/ACT NA SUSP: 2.0000 | Freq: Every day | NASAL | Status: DC

## 2015-12-17 MED ORDER — PROMETHAZINE HCL 6.25 MG/5ML PO SYRP: 6.2500 mg | ORAL_SOLUTION | Freq: Four times a day (QID) | ORAL | Status: DC | PRN

## 2015-12-17 NOTE — Progress Notes (Signed)
MEDICARE ANNUAL WELLNESS VISIT AND FOLLOW UP Assessment:     1. Essential hypertension -cont meds -well controlled currently -monitor at home -DASH diet  2. Hyperlipidemia -cont meds -diet and exercise  3. Prediabetes -cont diet and exercise  4. Diverticulosis of intestine without bleeding, unspecified intestinal tract location -monitor with colonoscopy at 5-10 year intervals  5. Gastroesophageal reflux disease, esophagitis presence not specified -avoid trigger foods -zantac prn  6. Vitamin D deficiency -cont supplement  7. Medication management -monitor labs at least biyearly  8. Encounter for Medicare annual wellness exam -due next   Over 30 minutes of exam, counseling, chart review, and critical decision making was performed  Plan:   During the course of the visit the patient was educated and counseled about appropriate screening and preventive services including:    Pneumococcal vaccine   Influenza vaccine  Prevnar 13  Td vaccine  Screening electrocardiogram  Colorectal cancer screening  Diabetes screening  Glaucoma screening  Nutrition counseling   Conditions/risks identified: BMI: Discussed weight loss, diet, and increase physical activity.  Increase physical activity: AHA recommends 150 minutes of physical activity a week.  Medications reviewed Diabetes is at goal, ACE/ARB therapy: Yes. Urinary Incontinence is not an issue: discussed non pharmacology and pharmacology options.  Fall risk: low- discussed PT, home fall assessment, medications.    Subjective:  Javier [name] is a 75 y.o. male who presents for Medicare Annual Wellness Visit and 3 month follow up for HTN, hyperlipidemia, prediabetes, and vitamin D Def.  Date of last medicare wellness visit was is 12/16.  His blood pressure has been controlled at home, today their BP is BP: (!) 148/82 mmHg He does workout. He denies chest pain, shortness of breath, dizziness.  He is on  cholesterol medication and denies myalgias. His cholesterol is at goal. The cholesterol last visit was:   Lab Results  Component Value Date   CHOL 164 09/11/2015   HDL 44 09/11/2015   LDLCALC 89 09/11/2015   TRIG 153* 09/11/2015   CHOLHDL 3.7 09/11/2015   He has been working on diet and exercise for prediabetes, and denies foot ulcerations, hyperglycemia, hypoglycemia , increased appetite, nausea, paresthesia of the feet, polydipsia, polyuria, visual disturbances, vomiting and weight loss. Last A1C in the office was:  Lab Results  Component Value Date   HGBA1C 5.7* 09/11/2015   Last GFR NonAA   Lab Results  Component Value Date   GFRNONAA 65 09/11/2015   AA  Lab Results  Component Value Date   GFRAA 75 09/11/2015   Patient is on Vitamin D supplement.   Lab Results  Component Value Date   VD25OH 85 09/11/2015     Patient has been having a hacking cough with clear mucous production x 2.5 weeks.  He has been taking allered medication and robitussin.  He does appear to be improving a little bit.   Patient's wife reports that he is having a difficult time hearing.  He has no history of ruptured ear drums.  He did work around a Control and instrumentation engineer.  He did wear protection off and on.  No family history of hearing loss that he is aware of.    Medication Review: Current Outpatient Prescriptions on File Prior to Visit  Medication Sig Dispense Refill  . BABY ASPIRIN PO Take 81 mg by mouth daily.    . benazepril-hydrochlorthiazide (LOTENSIN HCT) 20-25 MG tablet TAKE 1 TABLET EVERY DAY  FOR  BLOOD  PRESSURE 90 tablet 1  . Cholecalciferol (  VITAMIN D PO) Take 5,000 Int'l Units by mouth daily.    Marland Kitchen omeprazole (PRILOSEC) 20 MG capsule Take 20 mg by mouth daily.    . pravastatin (PRAVACHOL) 40 MG tablet TAKE 1 TABLET AT BEDTIME  FOR  CHOLESTEROL 90 tablet 3   No current facility-administered medications on file prior to visit.    Current Problems (verified) Patient Active Problem List    Diagnosis Date Noted  . Encounter for Medicare annual wellness exam 09/11/2015  . GERD  02/09/2015  . Medication management 11/02/2013  . Hyperlipidemia   . Hypertension   . Prediabetes   . Vitamin D deficiency   . Diverticulosis     Screening Tests Immunization History  Administered Date(s) Administered  . Influenza Split 07/28/2013  . Influenza, High Dose Seasonal PF 09/11/2015  . Pneumococcal-Unspecified 07/01/1999, 09/20/2009  . Td 09/20/2009  . Zoster 11/08/2009    Preventative care: Last colonoscopy: 2015  Prior vaccinations: TD or Tdap: 2010  Influenza: 2016  Pneumococcal: 2010 Prevnar13: Due, but currently out of stock Shingles/Zostavax: 2011  Names of Other Physician/Practitioners you currently use: 1. [facility] here for primary care 2. Delman Cheadle, eye doctor, last visit 2015 3. Patient cannot remember name currently, dentist, last visit 2014 Patient Care Team: Unk Pinto, MD as PCP - General (Internal Medicine) Inda Castle, MD as Consulting Physician (Gastroenterology) Melissa Noon, OD as Referring Physician (Optometry)  Past Surgical History  Procedure Laterality Date  . No past surgeries     Family History  Problem Relation Age of Onset  . Stroke Mother   . Early death Father     Farm/tractor accident  . Diabetes Brother   . Cirrhosis Brother    Social History  Substance Use Topics  . Smoking status: Former Smoker    Types: Cigarettes    Quit date: 01/17/2003  . Smokeless tobacco: Never Used     Comment: Occasional cigars.  . Alcohol Use: No     Comment: rarely    MEDICARE WELLNESS OBJECTIVES: Tobacco use: He does not smoke.  Patient is a former smoker. If yes, counseling given Alcohol Current alcohol use: social drinker Osteoporosis: hypogonadism, History of fracture in the past year: no Fall risk: Low Risk Hearing: impaired Visual acuity: normal,  does not perform annual eye exam Diet: well  balanced Physical activity: Current Exercise Habits: Home exercise routine, Type of exercise: walking (yard work, Environmental consultant), Time (Minutes): 40, Frequency (Times/Week): 5, Weekly Exercise (Minutes/Week): 200, Intensity: Mild Cardiac risk factors: Cardiac Risk Factors include: advanced age (>23men, >65 women);dyslipidemia;family history of premature cardiovascular disease;male gender;sedentary lifestyle Depression/mood screen:   Depression screen [facility] 2/9 12/17/2015  Decreased Interest 0  Down, Depressed, Hopeless 0  PHQ - 2 Score 0    ADLs:  In your present state of health, do you have any difficulty performing the following activities: 12/17/2015 09/11/2015  Hearing? N N  Vision? N N  Difficulty concentrating or making decisions? N N  Walking or climbing stairs? N N  Dressing or bathing? N N  Doing errands, shopping? N N  Preparing Food and eating ? N -  Using the Toilet? N -  In the past six months, have you accidently leaked urine? N -  Do you have problems with loss of bowel control? N -  Managing your Medications? N -  Managing your Finances? N -  Housekeeping or managing your Housekeeping? N -     Cognitive Testing  Alert? Yes  Normal Appearance?Yes  Oriented to person? Yes  Place? Yes   Time? Yes  Recall of three objects?  Yes  Can perform simple calculations? Yes  Displays appropriate judgment?Yes  Can read the correct time from a watch face?Yes  EOL planning: Does patient have an advance directive?: Yes Type of Advance Directive: Healthcare Power of Attorney, Living will Does patient want to make changes to advanced directive?: No - Patient declined Copy of advanced directive(s) in chart?: No - copy requested   Objective:   Today's Vitals   12/17/15 1353  BP: 148/82  Pulse: 60  Temp: 98.2 F (36.8 C)  TempSrc: Temporal  Resp: 16  Height: 6' 0.5" (1.842 m)  Weight: 193 lb (87.544 kg)   Body mass index is 25.8 kg/(m^2).  General appearance: alert, no  distress, WD/WN, male HEENT: normocephalic, sclerae anicteric, TMs pearly, nares patent, no discharge or erythema, pharynx normal Oral cavity: MMM, no lesions Neck: supple, no lymphadenopathy, no thyromegaly, no masses Heart: RRR, normal S1, S2, no murmurs Lungs: CTA bilaterally, no wheezes, rhonchi, or rales Abdomen: +bs, soft, non tender, non distended, no masses, no hepatomegaly, no splenomegaly Musculoskeletal: nontender, no swelling, no obvious deformity Extremities: no edema, no cyanosis, no clubbing Pulses: 2+ symmetric, upper and lower extremities, normal cap refill Neurological: alert, oriented x 3, CN2-12 intact, strength normal upper extremities and lower extremities, sensation normal throughout, DTRs 2+ throughout, no cerebellar signs, gait normal Psychiatric: normal affect, behavior normal, pleasant   Medicare Attestation I have personally reviewed: The patient's medical and social history Their use of alcohol, tobacco or illicit drugs Their current medications and supplements The patient's functional ability including ADLs,fall risks, home safety risks, cognitive, and hearing and visual impairment Diet and physical activities Evidence for depression or mood disorders  The patient's weight, height, BMI, and visual acuity have been recorded in the chart.  I have made referrals, counseling, and provided education to the patient based on review of the above and I have provided the patient with a written personalized care plan for preventive services.     Starlyn Skeans, PA-C   12/17/2015

## 2016-01-01 DIAGNOSIS — H40033 Anatomical narrow angle, bilateral: Secondary | ICD-10-CM | POA: Diagnosis not present

## 2016-01-01 DIAGNOSIS — H2513 Age-related nuclear cataract, bilateral: Secondary | ICD-10-CM | POA: Diagnosis not present

## 2016-02-18 ENCOUNTER — Ambulatory Visit: Payer: Self-pay | Admitting: Internal Medicine

## 2016-03-21 ENCOUNTER — Encounter: Payer: Self-pay | Admitting: Internal Medicine

## 2016-03-21 ENCOUNTER — Ambulatory Visit (INDEPENDENT_AMBULATORY_CARE_PROVIDER_SITE_OTHER): Payer: Commercial Managed Care - HMO | Admitting: Internal Medicine

## 2016-03-21 VITALS — BP 138/76 | HR 64 | Temp 97.9°F | Resp 16 | Ht 72.5 in | Wt 189.2 lb

## 2016-03-21 DIAGNOSIS — E785 Hyperlipidemia, unspecified: Secondary | ICD-10-CM | POA: Diagnosis not present

## 2016-03-21 DIAGNOSIS — E559 Vitamin D deficiency, unspecified: Secondary | ICD-10-CM | POA: Diagnosis not present

## 2016-03-21 DIAGNOSIS — R7303 Prediabetes: Secondary | ICD-10-CM

## 2016-03-21 DIAGNOSIS — K219 Gastro-esophageal reflux disease without esophagitis: Secondary | ICD-10-CM

## 2016-03-21 DIAGNOSIS — I10 Essential (primary) hypertension: Secondary | ICD-10-CM | POA: Diagnosis not present

## 2016-03-21 DIAGNOSIS — Z79899 Other long term (current) drug therapy: Secondary | ICD-10-CM | POA: Diagnosis not present

## 2016-03-21 LAB — HEPATIC FUNCTION PANEL
ALBUMIN: 4.1 g/dL (ref 3.6–5.1)
ALT: 12 U/L (ref 9–46)
AST: 13 U/L (ref 10–35)
Alkaline Phosphatase: 51 U/L (ref 40–115)
BILIRUBIN TOTAL: 0.4 mg/dL (ref 0.2–1.2)
Bilirubin, Direct: 0.1 mg/dL (ref <=0.2)
Indirect Bilirubin: 0.3 mg/dL (ref 0.2–1.2)
Total Protein: 6.6 g/dL (ref 6.1–8.1)

## 2016-03-21 LAB — LIPID PANEL
Cholesterol: 156 mg/dL (ref 125–200)
HDL: 49 mg/dL (ref >=40)
LDL Cholesterol: 94 mg/dL (ref <130)
Total CHOL/HDL Ratio: 3.2 Ratio (ref <=5.0)
Triglycerides: 64 mg/dL (ref <150)
VLDL: 13 mg/dL (ref <30)

## 2016-03-21 LAB — CBC WITH DIFFERENTIAL/PLATELET
BASOS ABS: 0 {cells}/uL (ref 0–200)
Basophils Relative: 0 %
EOS ABS: 103 {cells}/uL (ref 15–500)
Eosinophils Relative: 1 %
HEMATOCRIT: 41.9 % (ref 38.5–50.0)
Hemoglobin: 13.5 g/dL (ref 13.2–17.1)
LYMPHS PCT: 72 %
Lymphs Abs: 7416 cells/uL — ABNORMAL HIGH (ref 850–3900)
MCH: 29.5 pg (ref 27.0–33.0)
MCHC: 32.2 g/dL (ref 32.0–36.0)
MCV: 91.5 fL (ref 80.0–100.0)
MONO ABS: 618 {cells}/uL (ref 200–950)
MPV: 12 fL (ref 7.5–12.5)
Monocytes Relative: 6 %
NEUTROS PCT: 21 %
Neutro Abs: 2163 cells/uL (ref 1500–7800)
PLATELETS: 165 10*3/uL (ref 140–400)
RBC: 4.58 MIL/uL (ref 4.20–5.80)
RDW: 14.3 % (ref 11.0–15.0)
WBC: 10.3 10*3/uL (ref 3.8–10.8)

## 2016-03-21 LAB — BASIC METABOLIC PANEL WITH GFR
BUN: 14 mg/dL (ref 7–25)
CALCIUM: 9 mg/dL (ref 8.6–10.3)
CO2: 22 mmol/L (ref 20–31)
Chloride: 111 mmol/L — ABNORMAL HIGH (ref 98–110)
Creat: 1.07 mg/dL (ref 0.70–1.18)
GFR, EST AFRICAN AMERICAN: 78 mL/min (ref >=60)
GFR, EST NON AFRICAN AMERICAN: 68 mL/min (ref >=60)
GLUCOSE: 92 mg/dL (ref 65–99)
POTASSIUM: 3.9 mmol/L (ref 3.5–5.3)
Sodium: 144 mmol/L (ref 135–146)

## 2016-03-21 LAB — TSH: TSH: 0.88 m[IU]/L (ref 0.40–4.50)

## 2016-03-21 LAB — MAGNESIUM: MAGNESIUM: 2.1 mg/dL (ref 1.5–2.5)

## 2016-03-21 NOTE — Progress Notes (Signed)
Patient ID: Javier [name], male   DOB: Jul 26, 1941, 75 y.o.   MRN: [id number]  [facility]                       Unk Pinto, M.D.        Uvaldo Bristle. Silverio Lay, P.A.-C       Starlyn Skeans, P.A.-C   [facility]                [address] [place], [place] [id number] Telephone [phone] Telefax [phone] ______________________________________________________________________     This very nice 75 y.o. MBM presents for 6 month follow up with Hypertension, Hyperlipidemia, Pre-Diabetes and Vitamin D Deficiency.      Patient is treated for HTN since 2004 & BP has been controlled at home. Today's BP: 138/76 mmHg. Patient has had no complaints of any cardiac type chest pain, palpitations, dyspnea/orthopnea/PND, dizziness, claudication, or dependent edema.     Hyperlipidemia is controlled with diet & meds. Patient denies myalgias or other med SE's. Last Lipids were at goal with  Cholesterol 164; HDL 44; LDL 89; Triglycerides 153 on 09/11/2015.      Also, the patient has history of PreDiabetes since 08/2011 with A1c 5.8% and has had no symptoms of reactive hypoglycemia, diabetic polys, paresthesias or visual blurring.  Last A1c was still borderline at  5.7% on 09/11/2015.     Further, the patient also has history of Vitamin D Deficiency  Of "50" on treatment in 2008 and supplements vitamin D without any suspected side-effects. Last vitamin D was  85 on 09/11/2015.  Medication Sig  . BABY ASPIRIN PO Take 81 mg by mouth daily.  . benazepril-hctz20-25 MG tablet TAKE 1 TABLET EVERY DAY  FOR  BLOOD  PRESSURE  . VITAMIN D  Take 5,000 Int'l Units by mouth daily.  Marland Kitchen NASONEX nasal spray Place 2 sprays into the nose daily.  Marland Kitchen omeprazole  20 MG capsule Take 20 mg by mouth daily.  . pravastatin  40 MG tablet TAKE 1 TABLET AT BEDTIME  FOR  CHOLESTEROL   Allergies  Allergen Reactions  . Penicillins   . Ppd  [Tuberculin Purified Protein Derivative]     Positive test in 2004   PMHx:   Past Medical History  Diagnosis Date  . Hyperlipidemia   . Hypertension   . Prediabetes   . Vitamin D deficiency   . Diverticulosis    Immunization History  Administered Date(s) Administered  . Influenza Split 07/28/2013  . Influenza, High Dose Seasonal PF 09/11/2015  . Pneumococcal-Unspecified 07/01/1999, 09/20/2009  . Td 09/20/2009  . Zoster 11/08/2009   FHx:    Reviewed / unchanged  SHx:    Reviewed / unchanged  Systems Review:  Constitutional: Denies fever, chills, wt changes, headaches, insomnia, fatigue, night sweats, change in appetite. Eyes: Denies redness, blurred vision, diplopia, discharge, itchy, watery eyes.  ENT: Denies discharge, congestion, post nasal drip, epistaxis, sore throat, earache, hearing loss, dental pain, tinnitus, vertigo, sinus pain, snoring.  CV: Denies chest pain, palpitations, irregular heartbeat, syncope, dyspnea, diaphoresis, orthopnea, PND, claudication or edema. Respiratory: denies cough, dyspnea, DOE, pleurisy, hoarseness, laryngitis, wheezing.  Gastrointestinal: Denies dysphagia, odynophagia, heartburn, reflux, water brash, abdominal pain or cramps, nausea, vomiting, bloating, diarrhea, constipation, hematemesis, melena, hematochezia  or hemorrhoids. Genitourinary: Denies dysuria, frequency, urgency, nocturia, hesitancy, discharge, hematuria or flank pain. Musculoskeletal:  Denies arthralgias, myalgias, stiffness, jt. swelling, pain, limping or strain/sprain.  Skin: Denies pruritus, rash, hives, warts, acne, eczema or change in skin lesion(s). Neuro: No weakness, tremor, incoordination, spasms, paresthesia or pain. Psychiatric: Denies confusion, memory loss or sensory loss. Endo: Denies change in weight, skin or hair change.  Heme/Lymph: No excessive bleeding, bruising or enlarged lymph nodes.  Physical Exam  BP 138/76 mmHg  Pulse 64  Temp(Src) 97.9 F (36.6 C)   Resp 16  Ht 6' 0.5" (1.842 m)  Wt 189 lb 3.2 oz (85.821 kg)  BMI 25.29 kg/m2  Appears well nourished and in no distress.  Eyes: PERRLA, EOMs, conjunctiva no swelling or erythema. Sinuses: No frontal/maxillary tenderness ENT/Mouth: EAC's clear, TM's nl w/o erythema, bulging. Nares clear w/o erythema, swelling, exudates. Oropharynx clear without erythema or exudates. Oral hygiene is good. Tongue normal, non obstructing. Hearing intact.  Neck: Supple. Thyroid nl. Car 2+/2+ without bruits, nodes or JVD. Chest: Respirations nl with BS clear & equal w/o rales, rhonchi, wheezing or stridor.  Cor: Heart sounds normal w/ regular rate and rhythm without sig. murmurs, gallops, clicks, or rubs. Peripheral pulses normal and equal  without edema.  Abdomen: Soft & bowel sounds normal. Non-tender w/o guarding, rebound, hernias, masses, or organomegaly.  Lymphatics: Unremarkable.  Musculoskeletal: Full ROM all peripheral extremities, joint stability, 5/5 strength, and normal gait.  Skin: Warm, dry without exposed rashes, lesions or ecchymosis apparent.  Neuro: Cranial nerves intact, reflexes equal bilaterally. Sensory-motor testing grossly intact. Tendon reflexes grossly intact.  Pysch: Alert & oriented x 3.  Insight and judgement nl & appropriate. No ideations.  Assessment and Plan:  1. Essential hypertension   - Continue monitor blood pressure at home. Continue diet/meds same. - TSH  2. Hyperlipidemia  - Continue diet/meds, exercise,& lifestyle modifications. Continue monitor periodic cholesterol/liver & renal functions  - Lipid panel - TSH  3. Prediabetes   - Continue diet, exercise, lifestyle modifications. Monitor appropriate labs. - Hemoglobin A1c - Insulin, random  4. Vitamin D deficiency  - Continue supplementation. - VITAMIN D 25 Hydroxy  5. Gastroesophageal reflux disease, esophagitis presence not specified   6. Medication management  - CBC with Differential/Platelet -  BASIC METABOLIC PANEL WITH GFR - Hepatic function panel - Magnesium   Recommended regular exercise, BP monitoring, weight control, and discussed med and SE's. Recommended labs to assess and monitor clinical status. Further disposition pending results of labs. Over 30 minutes of exam, counseling, chart review was performed

## 2016-03-21 NOTE — Patient Instructions (Signed)

## 2016-03-22 LAB — INSULIN, RANDOM: INSULIN: 7.1 u[IU]/mL (ref 2.0–19.6)

## 2016-03-22 LAB — HEMOGLOBIN A1C
HEMOGLOBIN A1C: 5.7 % — AB (ref <5.7)
Mean Plasma Glucose: 117 mg/dL

## 2016-03-22 LAB — VITAMIN D 25 HYDROXY (VIT D DEFICIENCY, FRACTURES): VIT D 25 HYDROXY: 79 ng/mL (ref 30–100)

## 2016-03-24 ENCOUNTER — Other Ambulatory Visit: Payer: Self-pay | Admitting: Internal Medicine

## 2016-03-24 LAB — PATHOLOGIST SMEAR REVIEW

## 2016-04-15 ENCOUNTER — Other Ambulatory Visit: Payer: Self-pay | Admitting: Internal Medicine

## 2016-04-15 ENCOUNTER — Emergency Department (HOSPITAL_COMMUNITY): Payer: Commercial Managed Care - HMO

## 2016-04-15 ENCOUNTER — Encounter (HOSPITAL_COMMUNITY): Payer: Self-pay | Admitting: Emergency Medicine

## 2016-04-15 ENCOUNTER — Emergency Department (HOSPITAL_COMMUNITY)
Admission: EM | Admit: 2016-04-15 | Discharge: 2016-04-15 | Disposition: A | Discharge status: Home / Self Care | Payer: Commercial Managed Care - HMO | Attending: Emergency Medicine | Admitting: Emergency Medicine

## 2016-04-15 DIAGNOSIS — Z7982 Long term (current) use of aspirin: Secondary | ICD-10-CM | POA: Diagnosis not present

## 2016-04-15 DIAGNOSIS — R1030 Lower abdominal pain, unspecified: Secondary | ICD-10-CM

## 2016-04-15 DIAGNOSIS — Z87891 Personal history of nicotine dependence: Secondary | ICD-10-CM | POA: Insufficient documentation

## 2016-04-15 DIAGNOSIS — R1031 Right lower quadrant pain: Secondary | ICD-10-CM | POA: Diagnosis not present

## 2016-04-15 DIAGNOSIS — R197 Diarrhea, unspecified: Secondary | ICD-10-CM | POA: Diagnosis not present

## 2016-04-15 DIAGNOSIS — I10 Essential (primary) hypertension: Secondary | ICD-10-CM | POA: Insufficient documentation

## 2016-04-15 DIAGNOSIS — R1013 Epigastric pain: Secondary | ICD-10-CM | POA: Diagnosis not present

## 2016-04-15 DIAGNOSIS — R1032 Left lower quadrant pain: Secondary | ICD-10-CM | POA: Diagnosis not present

## 2016-04-15 LAB — COMPREHENSIVE METABOLIC PANEL
ALT: 13 U/L — AB (ref 17–63)
AST: 18 U/L (ref 15–41)
Albumin: 3.9 g/dL (ref 3.5–5.0)
Alkaline Phosphatase: 53 U/L (ref 38–126)
Anion gap: 9 (ref 5–15)
BILIRUBIN TOTAL: 0.5 mg/dL (ref 0.3–1.2)
BUN: 12 mg/dL (ref 6–20)
CO2: 21 mmol/L — ABNORMAL LOW (ref 22–32)
CREATININE: 1.32 mg/dL — AB (ref 0.61–1.24)
Calcium: 9.3 mg/dL (ref 8.9–10.3)
Chloride: 108 mmol/L (ref 101–111)
GFR, EST AFRICAN AMERICAN: 59 mL/min — AB (ref >60)
GFR, EST NON AFRICAN AMERICAN: 51 mL/min — AB (ref >60)
Glucose, Bld: 137 mg/dL — ABNORMAL HIGH (ref 65–99)
POTASSIUM: 3.3 mmol/L — AB (ref 3.5–5.1)
Sodium: 138 mmol/L (ref 135–145)
TOTAL PROTEIN: 6.5 g/dL (ref 6.5–8.1)

## 2016-04-15 LAB — CBC
HCT: 40.7 % (ref 39.0–52.0)
Hemoglobin: 13.4 g/dL (ref 13.0–17.0)
MCH: 29.4 pg (ref 26.0–34.0)
MCHC: 32.9 g/dL (ref 30.0–36.0)
MCV: 89.3 fL (ref 78.0–100.0)
PLATELETS: 166 10*3/uL (ref 150–400)
RBC: 4.56 MIL/uL (ref 4.22–5.81)
RDW: 13.8 % (ref 11.5–15.5)
WBC: 14 10*3/uL — AB (ref 4.0–10.5)

## 2016-04-15 LAB — URINALYSIS, ROUTINE W REFLEX MICROSCOPIC
Bilirubin Urine: NEGATIVE
Glucose, UA: NEGATIVE mg/dL
Hgb urine dipstick: NEGATIVE
KETONES UR: NEGATIVE mg/dL
LEUKOCYTES UA: NEGATIVE
NITRITE: NEGATIVE
PROTEIN: NEGATIVE mg/dL
Specific Gravity, Urine: 1.027 (ref 1.005–1.030)
pH: 5 (ref 5.0–8.0)

## 2016-04-15 LAB — LIPASE, BLOOD: Lipase: 24 U/L (ref 11–51)

## 2016-04-15 MED ORDER — SODIUM CHLORIDE 0.9 % IV BOLUS (SEPSIS)
1000.0000 mL | Freq: Once | INTRAVENOUS | Status: AC
  Administered 2016-04-15: 1000 mL via INTRAVENOUS

## 2016-04-15 MED ORDER — IOPAMIDOL (ISOVUE-370) INJECTION 76%
INTRAVENOUS | Status: AC
  Administered 2016-04-15: 100 mL
  Filled 2016-04-15: qty 100

## 2016-04-15 NOTE — ED Notes (Signed)
Pt. reports intermittent low abdominal pain with diarrhea onset Sunday , denies emesis or fever .

## 2016-04-15 NOTE — Discharge Instructions (Signed)
Start taking lactobacillus (probiotics) to help with your diarrhea.  Abdominal Pain, Adult Many things can cause abdominal pain. Usually, abdominal pain is not caused by a disease and will improve without treatment. It can often be observed and treated at home. Your health care provider will do a physical exam and possibly order blood tests and X-rays to help determine the seriousness of your pain. However, in many cases, more time must pass before a clear cause of the pain can be found. Before that point, your health care provider may not know if you need more testing or further treatment. HOME CARE INSTRUCTIONS Monitor your abdominal pain for any changes. The following actions may help to alleviate any discomfort you are experiencing:  Only take over-the-counter or prescription medicines as directed by your health care provider.  Do not take laxatives unless directed to do so by your health care provider.  Try a clear liquid diet (broth, tea, or water) as directed by your health care provider. Slowly move to a bland diet as tolerated. SEEK MEDICAL CARE IF:  You have unexplained abdominal pain.  You have abdominal pain associated with nausea or diarrhea.  You have pain when you urinate or have a bowel movement.  You experience abdominal pain that wakes you in the night.  You have abdominal pain that is worsened or improved by eating food.  You have abdominal pain that is worsened with eating fatty foods.  You have a fever. SEEK IMMEDIATE MEDICAL CARE IF:  Your pain does not go away within 2 hours.  You keep throwing up (vomiting).  Your pain is felt only in portions of the abdomen, such as the right side or the left lower portion of the abdomen.  You pass bloody or black tarry stools. MAKE SURE YOU:  Understand these instructions.  Will watch your condition.  Will get help right away if you are not doing well or get worse.   This information is not intended to replace  advice given to you by your health care provider. Make sure you discuss any questions you have with your health care provider.   Document Released: 06/25/2005 Document Revised: 06/06/2015 Document Reviewed: 05/25/2013 Elsevier Interactive Patient Education [employer].

## 2016-04-15 NOTE — ED Provider Notes (Signed)
CSN: [id number]     Arrival date & time 04/15/16  0019 History   First MD Initiated Contact with Patient 04/15/16 [phone]     Chief Complaint  Patient presents with  . Abdominal Pain     (Consider location/radiation/quality/duration/timing/severity/associated sxs/prior Treatment) HPI Comments: 75 year old male with past medical history including hypertension, hyperlipidemia, diverticulosis who presents with abdominal pain and diarrhea. Proximally 2 days ago, the patient began having intermittent, occasionally severe lower abdominal pain across his lower abdomen associated with diarrhea. He has not noticed any blood in his stool. He denies any associated nausea or vomiting. No fevers, chest pain, or shortness of breath. No urinary problems. He has never had this pain before. No sick contacts or recent travel.  Patient is a 75 y.o. male presenting with abdominal pain. The history is provided by the patient.  Abdominal Pain   Past Medical History  Diagnosis Date  . Hyperlipidemia   . Hypertension   . Prediabetes   . Vitamin D deficiency   . Diverticulosis    Past Surgical History  Procedure Laterality Date  . No past surgeries     Family History  Problem Relation Age of Onset  . Stroke Mother   . Early death Father     Farm/tractor accident  . Diabetes Brother   . Cirrhosis Brother    Social History  Substance Use Topics  . Smoking status: Former Smoker    Types: Cigarettes    Quit date: 01/17/2003  . Smokeless tobacco: Never Used     Comment: Occasional cigars.  . Alcohol Use: No    Review of Systems  Gastrointestinal: Positive for abdominal pain.   10 Systems reviewed and are negative for acute change except as noted in the HPI.    Allergies  Penicillins and Ppd  Home Medications   Prior to Admission medications   Medication Sig Start Date End Date Taking? Authorizing Provider  aspirin EC 81 MG tablet Take 81 mg by mouth daily.   Yes Historical Provider, MD   benazepril-hydrochlorthiazide (LOTENSIN HCT) 20-25 MG tablet TAKE 1 TABLET EVERY DAY  FOR  BLOOD  PRESSURE 11/27/15  Yes Unk Pinto, MD  Cholecalciferol (VITAMIN D3) 5000 units TABS Take 5,000 Units by mouth daily.   Yes Historical Provider, MD  omeprazole (PRILOSEC) 20 MG capsule Take 20 mg by mouth daily.   Yes Historical Provider, MD  pravastatin (PRAVACHOL) 40 MG tablet TAKE 1 TABLET AT BEDTIME  FOR  CHOLESTEROL 08/02/14  Yes Unk Pinto, MD  mometasone (NASONEX) 50 MCG/ACT nasal spray Place 2 sprays into the nose daily. Patient not taking: Reported on 04/15/2016 12/17/15 12/16/16  [name], PA-C   BP 152/91 mmHg  Pulse 57  Temp(Src) 98.9 F (37.2 C) (Oral)  Resp 18  SpO2 100% Physical Exam  Constitutional: He is oriented to person, place, and time. He appears well-developed and well-nourished. No distress.  HENT:  Head: Normocephalic and atraumatic.  Moist mucous membranes  Eyes: Conjunctivae are normal. Pupils are equal, round, and reactive to light.  Neck: Neck supple.  Cardiovascular: Normal rate, regular rhythm and normal heart sounds.   No murmur heard. Occasional premature beat  Pulmonary/Chest: Effort normal and breath sounds normal.  Abdominal: Soft. Bowel sounds are normal. He exhibits no distension. There is tenderness (midepigastrium, LLQ, RLQ). There is no rebound and no guarding.  Musculoskeletal: He exhibits no edema.  Neurological: He is alert and oriented to person, place, and time.  Fluent speech  Skin: Skin is warm  and dry. No rash noted.  Psychiatric: He has a normal mood and affect. Judgment normal.  Nursing note and vitals reviewed.   ED Course  Procedures (including critical care time) Labs Review Labs Reviewed  COMPREHENSIVE METABOLIC PANEL - Abnormal; Notable for the following:    Potassium 3.3 (*)    CO2 21 (*)    Glucose, Bld 137 (*)    Creatinine, Ser 1.32 (*)    ALT 13 (*)    GFR calc non Af Amer 51 (*)    GFR calc Af Amer 59  (*)    All other components within normal limits  CBC - Abnormal; Notable for the following:    WBC 14.0 (*)    All other components within normal limits  LIPASE, BLOOD  URINALYSIS, ROUTINE W REFLEX MICROSCOPIC (NOT AT [facility])    Imaging Review Ct Abdomen Pelvis W Contrast  04/15/2016  CLINICAL DATA:  Lower abdominal pain and diarrhea.  Leukocytosis. EXAM: CT ABDOMEN AND PELVIS WITH CONTRAST TECHNIQUE: Multidetector CT imaging of the abdomen and pelvis was performed using the standard protocol following bolus administration of intravenous contrast. CONTRAST:  100 cc Isovue 370 intravenous COMPARISON:  None. FINDINGS: Lower chest and abdominal wall:  No contributory findings. Hepatobiliary: No focal liver abnormality.No evidence of biliary obstruction or stone. Pancreas: Unremarkable. Spleen: Unremarkable. Adrenals/Urinary Tract: Negative adrenals. No hydronephrosis or stone. Unremarkable bladder. Stomach/Bowel: When accounting for under distended segments, no convincing inflammatory or ischemic colonic wall thickening. Mild colonic diverticulosis without superimposed inflammation. No bowel obstruction. No appendicitis. Reproductive:Symmetric prostate enlargement, mild to moderate. Vascular/Lymphatic: Scattered atherosclerotic calcifications without acute vascular finding. No mass or adenopathy. Other: No ascites or pneumoperitoneum. Musculoskeletal: No acute abnormalities. Advanced lumbar facet arthropathy. IMPRESSION: No acute finding.  No diverticulitis or convincing colitis. Electronically Signed   By: Monte Fantasia M.D.   On: 04/15/2016 06:18   I have personally reviewed and evaluated these  lab results as part of my medical decision-making.   EKG Interpretation None     Medications  sodium chloride 0.9 % bolus 1,000 mL (0 mLs Intravenous Stopped 04/15/16 0700)  iopamidol (ISOVUE-370) 76 % injection (100 mLs  Contrast Given 04/15/16 0540)    MDM   Final diagnoses:  Lower abdominal pain   Diarrhea, unspecified type   Patient with a few days of lower abdominal pain associated with diarrhea. He was well-appearing on exam, afebrile, vital signs notable for hypertension. He had tenderness across his lower abdomen as well as in his midepigastrium, no peritonitis or guarding. Obtained above lab work which was notable for creatinine of 1.32, LD elevated from baseline, and WBC 14,000. Gave the patient an IV fluid bolus. Obtained a CT of the abdomen and pelvis to evaluate for acute process such as diverticulitis, pt denies hx of these symptoms.  CT showed no acute findings to explain the patient's pain. No evidence of diverticulitis, appendicitis, or colitis. Because of the patient's mild leukocytosis and diarrhea, I suspect a self-limited process. No evidence of UTI and the patient was comfortable on reexamination. No reports of vomiting. I discussed supportive care for his diarrhea including continued hydration and probiotics. Instructed him to follow-up with his PCP later this week. Extensively reviewed return precautions including fever, worsening pain, or bloody stools. Patient voiced understanding and was discharged in satisfactory condition.  Sharlett Iles, MD 04/15/16 [phone]

## 2016-04-16 ENCOUNTER — Ambulatory Visit (INDEPENDENT_AMBULATORY_CARE_PROVIDER_SITE_OTHER): Payer: Commercial Managed Care - HMO | Admitting: Internal Medicine

## 2016-04-16 ENCOUNTER — Encounter: Payer: Self-pay | Admitting: Internal Medicine

## 2016-04-16 VITALS — BP 140/82 | HR 66 | Temp 98.0°F | Resp 16 | Ht 72.05 in | Wt 186.0 lb

## 2016-04-16 DIAGNOSIS — A09 Infectious gastroenteritis and colitis, unspecified: Secondary | ICD-10-CM | POA: Diagnosis not present

## 2016-04-16 DIAGNOSIS — R197 Diarrhea, unspecified: Secondary | ICD-10-CM

## 2016-04-16 MED ORDER — HYOSCYAMINE SULFATE 0.125 MG SL SUBL: SUBLINGUAL_TABLET | SUBLINGUAL | Status: DC

## 2016-04-16 MED ORDER — DICYCLOMINE HCL 20 MG PO TABS: 20.0000 mg | ORAL_TABLET | Freq: Three times a day (TID) | ORAL | Status: DC | PRN

## 2016-04-16 NOTE — Progress Notes (Signed)
   Subjective:    Patient ID: Javier [name], male    DOB: [birth year], 75 y.o.   MRN: [id number]  Diarrhea  Associated symptoms include abdominal pain. Pertinent negatives include no chills, fever or vomiting.  Abdominal Pain Associated symptoms include diarrhea. Pertinent negatives include no constipation, dysuria, fever, frequency, hematuria, nausea or vomiting.   Patient presents to the office for evaluation of diarrhea and abdominal cramping x 4 days. He reports that he was having upwards of 10 bowels.  The stools are partially formed and watery.  He did have one episode of dark stools.  He is having some abdominal cramping in the lower quadrants of his stomach that are coming and going.  He is still eating well and has his usual appetite.  He is having some sweating.  No antibiotics recently.  Diet is the same.  No recent changes.  No travels or vacations.  His wife reports that she is having some stomach cramping.     Review of Systems  Constitutional: Negative for fever, chills and fatigue.  Respiratory: Negative for chest tightness, shortness of breath and wheezing.   Cardiovascular: Negative for chest pain and palpitations.  Gastrointestinal: Positive for abdominal pain and diarrhea. Negative for nausea, vomiting, constipation, blood in stool and anal bleeding.  Genitourinary: Negative for dysuria, urgency, frequency and hematuria.       Objective:   Physical Exam  Constitutional: He is oriented to person, place, and time. He appears well-developed and well-nourished. No distress.  HENT:  Head: Normocephalic.  Mouth/Throat: Oropharynx is clear and moist. No oropharyngeal exudate.  Eyes: Conjunctivae are normal. No scleral icterus.  Neck: Normal range of motion. Neck supple. No JVD present. No thyromegaly present.  Cardiovascular: Normal rate, regular rhythm, normal heart sounds and intact distal pulses.  Exam reveals no gallop and no friction rub.   No murmur  heard. Pulmonary/Chest: Effort normal and breath sounds normal. No respiratory distress. He has no wheezes. He has no rales. He exhibits no tenderness.  Abdominal: Soft. Bowel sounds are normal. He exhibits no distension and no mass. There is no tenderness. There is no rebound and no guarding.  Musculoskeletal: Normal range of motion.  Lymphadenopathy:    He has no cervical adenopathy.  Neurological: He is alert and oriented to person, place, and time.  Skin: Skin is warm and dry. No rash noted. He is not diaphoretic.  Psychiatric: He has a normal mood and affect. His behavior is normal. Judgment and thought content normal.  Nursing note and vitals reviewed.   Filed Vitals:   04/16/16 1123  BP: 140/82  Pulse: 66  Temp: 98 F (36.7 C)  Resp: 16          Assessment & Plan:    1. Diarrhea of presumed infectious origin -bentyl prn -BRAT diet -likely viral infection as wife starting to show symptoms.   - Gastrointestinal Pathogen Panel PCR - Fecal Fat, Qualitative - Ova and parasite examination

## 2016-04-16 NOTE — Patient Instructions (Addendum)
Please make sure that you are drinking plenty of fluids like water, gatorade or pedialyte.  Please monitor your blood pressure closely.  If it is dropping to less than 110/60 call the office and let me know.  Please collect the stool kit and bring that back to me.    Please use the hyoscamine up to 4 times daily to help stop stomach cramping and diarrhea.  Please do not take this until after you do the stool collection kit as this will mask things on our stool tests.  Please avoid dairy products and spicy foods.    Food Choices to Help Relieve Diarrhea, Adult When you have diarrhea, the foods you eat and your eating habits are very important. Choosing the right foods and drinks can help relieve diarrhea. Also, because diarrhea can last up to 7 days, you need to replace lost fluids and electrolytes (such as sodium, potassium, and chloride) in order to help prevent dehydration.  WHAT GENERAL GUIDELINES DO I NEED TO FOLLOW?  Slowly drink 1 cup (8 oz) of fluid for each episode of diarrhea. If you are getting enough fluid, your urine will be clear or pale yellow.  Eat starchy foods. Some good choices include white rice, white toast, pasta, low-fiber cereal, baked potatoes (without the skin), saltine crackers, and bagels.  Avoid large servings of any cooked vegetables.  Limit fruit to two servings per day. A serving is  cup or 1 small piece.  Choose foods with less than 2 g of fiber per serving.  Limit fats to less than 8 tsp (38 g) per day.  Avoid fried foods.  Eat foods that have probiotics in them. Probiotics can be found in certain dairy products.  Avoid foods and beverages that may increase the speed at which food moves through the stomach and intestines (gastrointestinal tract). Things to avoid include:  High-fiber foods, such as dried fruit, raw fruits and vegetables, nuts, seeds, and whole grain foods.  Spicy foods and high-fat foods.  Foods and beverages sweetened with  high-fructose corn syrup, honey, or sugar alcohols such as xylitol, sorbitol, and mannitol. WHAT FOODS ARE RECOMMENDED? Grains White rice. White, [place], or pita breads (fresh or toasted), including plain rolls, buns, or bagels. White pasta. Saltine, soda, or graham crackers. Pretzels. Low-fiber cereal. Cooked cereals made with water (such as cornmeal, farina, or cream cereals). Plain muffins. Matzo. Melba toast. Zwieback.  Vegetables Potatoes (without the skin). Strained tomato and vegetable juices. Most well-cooked and canned vegetables without seeds. Tender lettuce. Fruits Cooked or canned applesauce, apricots, cherries, fruit cocktail, grapefruit, peaches, pears, or plums. Fresh bananas, apples without skin, cherries, grapes, cantaloupe, grapefruit, peaches, oranges, or plums.  Meat and Other Protein Products Baked or boiled chicken. Eggs. Tofu. Fish. Seafood. Smooth peanut butter. Ground or well-cooked tender beef, ham, veal, lamb, pork, or poultry.  Dairy Plain yogurt, kefir, and unsweetened liquid yogurt. Lactose-free milk, buttermilk, or soy milk. Plain hard cheese. Beverages Sport drinks. Clear broths. Diluted fruit juices (except prune). Regular, caffeine-free sodas such as ginger ale. Water. Decaffeinated teas. Oral rehydration solutions. Sugar-free beverages not sweetened with sugar alcohols. Other Bouillon, broth, or soups made from recommended foods.  The items listed above may not be a complete list of recommended foods or beverages. Contact your dietitian for more options. WHAT FOODS ARE NOT RECOMMENDED? Grains Whole grain, whole wheat, bran, or rye breads, rolls, pastas, crackers, and cereals. Wild or brown rice. Cereals that contain more than 2 g of fiber per serving. Corn  tortillas or taco shells. Cooked or dry oatmeal. Granola. Popcorn. Vegetables Raw vegetables. Cabbage, broccoli, Brussels sprouts, artichokes, baked beans, beet greens, corn, kale, legumes, peas, sweet  potatoes, and yams. Potato skins. Cooked spinach and cabbage. Fruits Dried fruit, including raisins and dates. Raw fruits. Stewed or dried prunes. Fresh apples with skin, apricots, mangoes, pears, raspberries, and strawberries.  Meat and Other Protein Products Chunky peanut butter. Nuts and seeds. Beans and lentils. Berniece Salines.  Dairy High-fat cheeses. Milk, chocolate milk, and beverages made with milk, such as milk shakes. Cream. Ice cream. Sweets and Desserts Sweet rolls, doughnuts, and sweet breads. Pancakes and waffles. Fats and Oils Butter. Cream sauces. Margarine. Salad oils. Plain salad dressings. Olives. Avocados.  Beverages Caffeinated beverages (such as coffee, tea, soda, or energy drinks). Alcoholic beverages. Fruit juices with pulp. Prune juice. Soft drinks sweetened with high-fructose corn syrup or sugar alcohols. Other Coconut. Hot sauce. Chili powder. Mayonnaise. Gravy. Cream-based or milk-based soups.  The items listed above may not be a complete list of foods and beverages to avoid. Contact your dietitian for more information. WHAT SHOULD I DO IF I BECOME DEHYDRATED? Diarrhea can sometimes lead to dehydration. Signs of dehydration include dark urine and dry mouth and skin. If you think you are dehydrated, you should rehydrate with an oral rehydration solution. These solutions can be purchased at pharmacies, retail stores, or online.  Drink -1 cup (120-240 mL) of oral rehydration solution each time you have an episode of diarrhea. If drinking this amount makes your diarrhea worse, try drinking smaller amounts more often. For example, drink 1-3 tsp (5-15 mL) every 5-10 minutes.  A general rule for staying hydrated is to drink 1-2 L of fluid per day. Talk to your health care provider about the specific amount you should be drinking each day. Drink enough fluids to keep your urine clear or pale yellow.   This information is not intended to replace advice given to you by your health  care provider. Make sure you discuss any questions you have with your health care provider.   Document Released: 12/06/2003 Document Revised: 10/06/2014 Document Reviewed: 08/08/2013 Elsevier Interactive Patient Education [employer].

## 2016-04-17 DIAGNOSIS — A09 Infectious gastroenteritis and colitis, unspecified: Secondary | ICD-10-CM | POA: Diagnosis not present

## 2016-04-18 LAB — OVA AND PARASITE EXAMINATION: OP: NONE SEEN

## 2016-04-22 LAB — FECAL FAT, QUALITATIVE: FECAL FAT QUALITATIVE: NORMAL

## 2016-04-24 ENCOUNTER — Encounter: Payer: Self-pay | Admitting: Internal Medicine

## 2016-04-24 ENCOUNTER — Ambulatory Visit (INDEPENDENT_AMBULATORY_CARE_PROVIDER_SITE_OTHER): Payer: Commercial Managed Care - HMO | Admitting: Internal Medicine

## 2016-04-24 VITALS — BP 140/70 | HR 76 | Temp 98.2°F | Resp 16 | Ht 72.5 in | Wt 181.0 lb

## 2016-04-24 DIAGNOSIS — K2970 Gastritis, unspecified, without bleeding: Secondary | ICD-10-CM | POA: Diagnosis not present

## 2016-04-24 LAB — GASTROINTESTINAL PATHOGEN PANEL PCR
C. difficile Tox A/B, PCR: NOT DETECTED
Campylobacter, PCR: NOT DETECTED
Cryptosporidium, PCR: NOT DETECTED
E COLI (STEC) STX1/STX2, PCR: NOT DETECTED
E coli (ETEC) LT/ST PCR: NOT DETECTED
E coli 0157, PCR: NOT DETECTED
Giardia lamblia, PCR: NOT DETECTED
NOROVIRUS, PCR: NOT DETECTED
ROTAVIRUS, PCR: NOT DETECTED
SALMONELLA, PCR: NOT DETECTED
SHIGELLA, PCR: NOT DETECTED

## 2016-04-24 MED ORDER — SUCRALFATE 1 G PO TABS: 1.0000 g | ORAL_TABLET | Freq: Four times a day (QID) | ORAL | 1 refills | Status: DC

## 2016-04-24 MED ORDER — OMEPRAZOLE 40 MG PO CPDR: 40.0000 mg | DELAYED_RELEASE_CAPSULE | Freq: Every day | ORAL | 0 refills | Status: DC

## 2016-04-24 NOTE — Progress Notes (Signed)
   Subjective:    Patient ID: Javier [name], male    DOB: [birth year], 75 y.o.   MRN: [id number]  HPI  Patient presents to the office for evaluation of abdominal pain that is right sided and worse after eating for 3 days.  He reports that the pain feels like it is gassy.  He reports that the pain is lasting for a couple hours.  It is worse at night.  He does feel sweaty at times from it.  He reports that he will be awoken from the pain.  He reports that he has not found anything that really helps with the pain.  He had a bowel movement this morning.  He is not having diarrhea anymore.  He reports that the stool this morning was regular.  No fevers or chills.  NO blood in stool or black colored stools.  He has not had any nausea.  He has been having reflux at nighttime.    Review of Systems  Constitutional: Positive for diaphoresis and fatigue. Negative for chills and fever.  Respiratory: Negative for chest tightness and shortness of breath.   Cardiovascular: Negative for chest pain and palpitations.  Gastrointestinal: Positive for abdominal pain and constipation. Negative for abdominal distention, anal bleeding, blood in stool, diarrhea, nausea and vomiting.  Neurological: Negative for headaches.  Psychiatric/Behavioral: Positive for sleep disturbance.       Objective:   Physical Exam  Constitutional: He is oriented to person, place, and time. He appears well-developed and well-nourished. No distress.  HENT:  Head: Normocephalic.  Mouth/Throat: Oropharynx is clear and moist. No oropharyngeal exudate.  Eyes: Conjunctivae are normal. No scleral icterus.  Neck: Normal range of motion. Neck supple. No JVD present. No thyromegaly present.  Cardiovascular: Normal rate, regular rhythm, normal heart sounds and intact distal pulses.  Exam reveals no gallop and no friction rub.   No murmur heard. Pulmonary/Chest: Effort normal and breath sounds normal. No respiratory distress. He has no wheezes. He  has no rales. He exhibits no tenderness.  Abdominal: Soft. Bowel sounds are normal. He exhibits no distension and no mass. There is generalized tenderness. There is no rigidity, no rebound, no guarding, no CVA tenderness, no tenderness at McBurney's point and negative Murphy's sign.  Musculoskeletal: Normal range of motion.  Lymphadenopathy:    He has no cervical adenopathy.  Neurological: He is alert and oriented to person, place, and time. No cranial nerve deficit. Coordination normal.  Skin: Skin is warm and dry. No rash noted. He is not diaphoretic.  Psychiatric: He has a normal mood and affect. His behavior is normal. Judgment and thought content normal.  Nursing note and vitals reviewed.   Vitals:   04/24/16 1429  BP: 140/70  Pulse: 76  Resp: 16  Temp: 98.2 F (36.8 C)          Assessment & Plan:     1. Gastritis -post viral gastritis -recent CT scan normal -stool testing normal -increase omeprazole to 40 mg daily prior to food -carafate prn -avoid laying down after eating  -call if no relief.

## 2016-04-24 NOTE — Patient Instructions (Signed)

## 2016-04-29 ENCOUNTER — Ambulatory Visit (INDEPENDENT_AMBULATORY_CARE_PROVIDER_SITE_OTHER): Payer: Commercial Managed Care - HMO | Admitting: Internal Medicine

## 2016-04-29 VITALS — BP 142/82 | HR 68 | Temp 98.1°F | Wt 180.8 lb

## 2016-04-29 DIAGNOSIS — R1013 Epigastric pain: Secondary | ICD-10-CM | POA: Diagnosis not present

## 2016-04-29 MED ORDER — SIMETHICONE 80 MG PO CHEW: 160.0000 mg | CHEWABLE_TABLET | Freq: Three times a day (TID) | ORAL | 0 refills | Status: DC | PRN

## 2016-04-29 NOTE — Patient Instructions (Signed)
Please continue to take 40 mg of omeprazole first thing in the morning on an empty stomach.  Wait 30 minutes prior to eating or drinking.    Please take the carafate (sucralfate) up to 4 times a day with your food.  Take simethicone 3 times daily to help with gas pains.  At meals would be the best time to take it.   You will hear from Kinder about the referral to the GI doctors office and also to get your ultrasound done.    Please go to the ER if you develop black stools, bloody stools, or vomit what looks like coffee grounds.

## 2016-04-29 NOTE — Progress Notes (Signed)
   Subjective:    Patient ID: Javier [name], male    DOB: 01-19-1941, 75 y.o.   MRN: [id number]  HPI  Patient reports back to the office for indigestion.  He reports that he is still having a lot of pain in his stomach and he has not been able to belch and is uncomfortable.  The only thing that he is able to eat is oatmeal.  He reports that he feels like he needs to belch.  He did not start taking the sucralfate.  He was taking 2 tablets sometimes.  Minimal bowel movements but no bloody stools or black colored stools.  Nausea but no vomiting.    Review of Systems  Constitutional: Positive for appetite change and diaphoresis. Negative for fatigue and fever.  Respiratory: Negative for chest tightness and shortness of breath.   Cardiovascular: Negative for chest pain and palpitations.  Gastrointestinal: Positive for abdominal pain and nausea. Negative for blood in stool, constipation, diarrhea and vomiting.  Genitourinary: Negative.   Neurological: Negative for dizziness, light-headedness and headaches.       Objective:   Physical Exam  Constitutional: He is oriented to person, place, and time. He appears well-developed and well-nourished. No distress.  HENT:  Head: Normocephalic.  Mouth/Throat: Oropharynx is clear and moist. No oropharyngeal exudate.  Eyes: Conjunctivae are normal. No scleral icterus.  Neck: Normal range of motion. Neck supple. No JVD present. No thyromegaly present.  Cardiovascular: Normal rate, regular rhythm, normal heart sounds and intact distal pulses.  Exam reveals no gallop and no friction rub.   No murmur heard. Pulmonary/Chest: Effort normal and breath sounds normal. No respiratory distress. He has no wheezes. He has no rales. He exhibits no tenderness.  Abdominal: Soft. Normal appearance and bowel sounds are normal. He exhibits no distension and no mass. There is tenderness. There is no rigidity, no rebound, no guarding, no CVA tenderness, no tenderness at  McBurney's point and negative Murphy's sign.  Musculoskeletal: Normal range of motion.  Lymphadenopathy:    He has no cervical adenopathy.  Neurological: He is alert and oriented to person, place, and time.  Skin: Skin is warm and dry. He is not diaphoretic.  Psychiatric: He has a normal mood and affect. His behavior is normal. Judgment and thought content normal.  Nursing note and vitals reviewed.   Vitals:   04/29/16 1556  BP: (!) 142/82  Pulse: 68  Temp: 98.1 F (36.7 C)         Assessment & Plan:    1. Abdominal pain, epigastric -gastritis vs. PUD vs. Less likely pancreatitis given recent normal CT scan -cont omeprazole -simethicone prn for gas pains -start carafate -rule out cholecystitis - US Abdomen Complete; Future - Ambulatory referral to Gastroenterology

## 2016-05-02 ENCOUNTER — Ambulatory Visit
Admission: RE | Admit: 2016-05-02 | Discharge: 2016-05-02 | Disposition: A | Discharge status: Home / Self Care | Payer: Commercial Managed Care - HMO | Source: Ambulatory Visit | Attending: Internal Medicine | Admitting: Internal Medicine

## 2016-05-02 DIAGNOSIS — R1013 Epigastric pain: Secondary | ICD-10-CM

## 2016-05-05 ENCOUNTER — Encounter (HOSPITAL_COMMUNITY): Payer: Self-pay | Admitting: Emergency Medicine

## 2016-05-05 ENCOUNTER — Emergency Department (HOSPITAL_COMMUNITY)
Admission: EM | Admit: 2016-05-05 | Discharge: 2016-05-05 | Disposition: A | Discharge status: Home / Self Care | Payer: Commercial Managed Care - HMO | Attending: Emergency Medicine | Admitting: Emergency Medicine

## 2016-05-05 ENCOUNTER — Telehealth: Payer: Self-pay | Admitting: Gastroenterology

## 2016-05-05 DIAGNOSIS — R110 Nausea: Secondary | ICD-10-CM | POA: Diagnosis not present

## 2016-05-05 DIAGNOSIS — R1030 Lower abdominal pain, unspecified: Secondary | ICD-10-CM

## 2016-05-05 DIAGNOSIS — I10 Essential (primary) hypertension: Secondary | ICD-10-CM | POA: Diagnosis not present

## 2016-05-05 DIAGNOSIS — Z87891 Personal history of nicotine dependence: Secondary | ICD-10-CM | POA: Insufficient documentation

## 2016-05-05 LAB — CBC
HEMATOCRIT: 43 % (ref 39.0–52.0)
HEMOGLOBIN: 14.1 g/dL (ref 13.0–17.0)
MCH: 29.2 pg (ref 26.0–34.0)
MCHC: 32.8 g/dL (ref 30.0–36.0)
MCV: 89 fL (ref 78.0–100.0)
Platelets: 176 10*3/uL (ref 150–400)
RBC: 4.83 MIL/uL (ref 4.22–5.81)
RDW: 13.3 % (ref 11.5–15.5)
WBC: 11 10*3/uL — ABNORMAL HIGH (ref 4.0–10.5)

## 2016-05-05 LAB — COMPREHENSIVE METABOLIC PANEL
ALBUMIN: 4.1 g/dL (ref 3.5–5.0)
ALT: 19 U/L (ref 17–63)
ANION GAP: 11 (ref 5–15)
AST: 24 U/L (ref 15–41)
Alkaline Phosphatase: 47 U/L (ref 38–126)
BUN: 8 mg/dL (ref 6–20)
CO2: 25 mmol/L (ref 22–32)
Calcium: 10.3 mg/dL (ref 8.9–10.3)
Chloride: 104 mmol/L (ref 101–111)
Creatinine, Ser: 1.02 mg/dL (ref 0.61–1.24)
GFR calc Af Amer: >60 mL/min (ref >60)
GFR calc non Af Amer: >60 mL/min (ref >60)
GLUCOSE: 95 mg/dL (ref 65–99)
POTASSIUM: 3.8 mmol/L (ref 3.5–5.1)
SODIUM: 140 mmol/L (ref 135–145)
Total Bilirubin: 0.8 mg/dL (ref 0.3–1.2)
Total Protein: 7 g/dL (ref 6.5–8.1)

## 2016-05-05 LAB — URINALYSIS, ROUTINE W REFLEX MICROSCOPIC
Glucose, UA: NEGATIVE mg/dL
HGB URINE DIPSTICK: NEGATIVE
Ketones, ur: >80 mg/dL — AB
Leukocytes, UA: NEGATIVE
Nitrite: NEGATIVE
PH: 6 (ref 5.0–8.0)
Protein, ur: NEGATIVE mg/dL
SPECIFIC GRAVITY, URINE: 1.025 (ref 1.005–1.030)

## 2016-05-05 LAB — LIPASE, BLOOD: Lipase: 19 U/L (ref 11–51)

## 2016-05-05 MED ORDER — SUCRALFATE 1 GM/10ML PO SUSP: 1.0000 g | Freq: Three times a day (TID) | ORAL | 0 refills | Status: DC

## 2016-05-05 MED ORDER — DICYCLOMINE HCL 20 MG PO TABS: 20.0000 mg | ORAL_TABLET | Freq: Two times a day (BID) | ORAL | 0 refills | Status: DC

## 2016-05-05 MED ORDER — DICYCLOMINE HCL 10 MG PO CAPS
10.0000 mg | ORAL_CAPSULE | Freq: Once | ORAL | Status: AC
  Administered 2016-05-05: 10 mg via ORAL
  Filled 2016-05-05: qty 1

## 2016-05-05 MED ORDER — GI COCKTAIL ~~LOC~~
30.0000 mL | Freq: Once | ORAL | Status: AC
  Administered 2016-05-05: 30 mL via ORAL
  Filled 2016-05-05: qty 30

## 2016-05-05 NOTE — Telephone Encounter (Signed)
The pt has bloating and gas, he has been using carafate and will try gas ex.  His appt with Dr [name] is 06/05/16.  He will also follow up with PCP

## 2016-05-05 NOTE — ED Provider Notes (Signed)
Emergency Department Provider Note   I have reviewed the triage vital signs and the nursing notes.   HISTORY  Chief Complaint Abdominal Pain   HPI Javier [name] is a 74 y.o. male with PMH of HTN, HLD presents to the emergency department for reevaluation of early satiety and lower abdominal discomfort. The patient states that he feels bloated and has been using OTC gas-ex medication without relief. The patient was seen in the emergency department on 7/18 and reports that his symptoms were similar at that time, however, the diarrhea he was experiencing has since resolved. The patient notes that his abdominal pain is not worsened or changed in quality. He denies any vomiting but does have some nausea with eating. Because of this he has had approximately a pounds of unintentional weight loss. He denies any change to his urine color. He continues to drink fluids. Denies chest pain or difficulty breathing. He followed up with his primary care physician who has scheduled him for outpatient upper endoscopy due to happen on 06/05/2016. Further denies any fever, chills, or hemoptysis.    Past Medical History:  Diagnosis Date  . Diverticulosis   . Hyperlipidemia   . Hypertension   . Prediabetes   . Vitamin D deficiency     Patient Active Problem List   Diagnosis Date Noted  . Encounter for Medicare annual wellness exam 09/11/2015  . GERD  02/09/2015  . Medication management 11/02/2013  . Hyperlipidemia   . Hypertension   . Prediabetes   . Vitamin D deficiency   . Diverticulosis     Past Surgical History:  Procedure Laterality Date  . NO PAST SURGERIES      Current Outpatient Rx  . Order #: [id number] Class: Historical Med  . Order #: [id number] Class: Normal  . Order #: [id number] Class: Historical Med  . Order #: [id number] Class: Historical Med  . Order #: [id number] Class: Normal  . Order #: [id number] Class: Normal  . Order #: [id number] Class: Normal  . Order #: [id number] Class:  Normal  . Order #: [id number] Class: Normal    Allergies Penicillins and Ppd [tuberculin purified protein derivative]  Family History  Problem Relation Age of Onset  . Stroke Mother   . Early death Father     Farm/tractor accident  . Diabetes Brother   . Cirrhosis Brother     Social History Social History  Substance Use Topics  . Smoking status: Former Smoker    Types: Cigarettes    Quit date: 01/17/2003  . Smokeless tobacco: Never Used     Comment: Occasional cigars.  . Alcohol use No    Review of Systems  Constitutional: No fever/chills Eyes: No visual changes. ENT: No sore throat. Cardiovascular: Denies chest pain. Respiratory: Denies shortness of breath. Gastrointestinal: Positive lower abdominal pain/bloating. Positive nausea, no vomiting.  No diarrhea.  Positive mild constipation. Genitourinary: Negative for dysuria. Musculoskeletal: Negative for back pain. Skin: Negative for rash. Neurological: Negative for headaches, focal weakness or numbness.  10-point ROS otherwise negative.  ____________________________________________   PHYSICAL EXAM:  VITAL SIGNS: ED Triage Vitals  Enc Vitals Group     BP 05/05/16 1427 166/94     Pulse Rate 05/05/16 1427 71     Resp 05/05/16 1427 16     Temp 05/05/16 1427 98.5 F (36.9 C)     Temp Source 05/05/16 1558 Oral     SpO2 05/05/16 1427 98 %     Weight 05/05/16 1427 175 lb 5 oz (79.5 kg)     Height  05/05/16 1427 6\' 1"  (1.854 m)     Pain Score 05/05/16 1425 8   Constitutional: Alert and oriented. Well appearing and in no acute distress. Eyes: Conjunctivae are normal. PERRL. EOMI. Head: Atraumatic. Nose: No congestion/rhinnorhea. Mouth/Throat: Mucous membranes are moist.  Oropharynx non-erythematous. Neck: No stridor. Cardiovascular: Normal rate, regular rhythm. Good peripheral circulation. Grossly normal heart sounds.   Respiratory: Normal respiratory effort.  No retractions. Lungs CTAB. Gastrointestinal: Soft  and non-tender to palpation throughout. No distention.  Musculoskeletal: No lower extremity tenderness nor edema. No gross deformities of extremities. Neurologic:  Normal speech and language. No gross focal neurologic deficits are appreciated.  Skin:  Skin is warm, dry and intact. No rash noted. Psychiatric: Mood and affect are normal. Speech and behavior are normal.  ____________________________________________   LABS (all labs ordered are listed, but only abnormal results are displayed)  Labs Reviewed  CBC - Abnormal; Notable for the following:       Result Value   WBC 11.0 (*)    All other components within normal limits  URINALYSIS, ROUTINE W REFLEX MICROSCOPIC (NOT AT [facility]) - Abnormal; Notable for the following:    Bilirubin Urine SMALL (*)    Ketones, ur >80 (*)    All other components within normal limits  LIPASE, BLOOD  COMPREHENSIVE METABOLIC PANEL   ____________________________________________  EKG  Reviewed in MUSE. No STEMI.  ____________________________________________  RADIOLOGY  None ____________________________________________   PROCEDURES  Procedure(s) performed:   Procedures  None ____________________________________________   INITIAL IMPRESSION / ASSESSMENT AND PLAN / ED COURSE  Pertinent labs & imaging results that were available during my care of the patient were reviewed by me and considered in my medical decision making (see chart for details).  Patient resents to the emergency department for continued early satiety and lower abdominal fullness. Patient's last bowel movement was 2 days prior. She has been evaluated for this in the emergency department 2 weeks earlier with negative CT scan of the abdomen and pelvis. He has since undergone outpatient ultrasound and is scheduled for upper endoscopy on 9/7. No fevers or chills. No change in the quality or severity of his pain. Very low suspicion for atypical ACS presentation in this setting. Will  review screening EKG. Plan to follow UA. Labs show mild leukocytosis which is improved from July lab work. Had long discussion with patient and wife regarding symptoms and need for outpatient endoscopy. No indication for emergent re-imaging at this time.   08:11 PM Patient is feeling much better after GI cocktail and Bentyl. Urinalysis with no sign of infection. Discussed plan for discharge with outpatient upper endoscopy as scheduled. Discussed return precautions in detail. We will discharge home with prescription for Maalox, Carafate, Bentyl. Patient will also begin taking a probiotic that a family member recommended which seems reasonable.   At this time, I do not feel there is any life-threatening condition present. I have reviewed and discussed all results (EKG, imaging, lab, urine as appropriate), exam findings with patient. I have reviewed nursing notes and appropriate previous records.  I feel the patient is safe to be discharged home without further emergent workup. Discussed usual and customary return precautions. Patient and family (if present) verbalize understanding and are comfortable with this plan.  Patient will follow-up with their primary care provider. If they do not have a primary care provider, information for follow-up has been provided to them. All questions have been answered.  ____________________________________________  FINAL CLINICAL IMPRESSION(S) / ED DIAGNOSES  Final diagnoses:  Lower abdominal pain  Nausea     MEDICATIONS GIVEN DURING THIS VISIT:  Medications  gi cocktail (Maalox,Lidocaine,Donnatal) (30 mLs Oral Given 05/05/16 1855)  dicyclomine (BENTYL) capsule 10 mg (10 mg Oral Given 05/05/16 1855)     NEW OUTPATIENT MEDICATIONS STARTED DURING THIS VISIT:  New Prescriptions   DICYCLOMINE (BENTYL) 20 MG TABLET    Take 1 tablet (20 mg total) by mouth 2 (two) times daily.   SUCRALFATE (CARAFATE) 1 GM/10ML SUSPENSION    Take 10 mLs (1 g total) by mouth 4 (four)  times daily -  with meals and at bedtime.      Note:  This document was prepared using Dragon voice recognition software and may include unintentional dictation errors.  Nanda Quinton, MD Emergency Medicine   Margette Fast, MD 05/05/16 2018

## 2016-05-05 NOTE — ED Triage Notes (Signed)
Pt. Stated, Javier [name] not been able to eat, and I just feel bloated this has been going on for about 3 weeks. I just can't eat.

## 2016-05-05 NOTE — Discharge Instructions (Signed)

## 2016-05-08 ENCOUNTER — Other Ambulatory Visit: Payer: Commercial Managed Care - HMO

## 2016-05-08 ENCOUNTER — Other Ambulatory Visit: Payer: Self-pay | Admitting: Internal Medicine

## 2016-05-08 MED ORDER — LIDOCAINE VISCOUS 2 % MT SOLN: 20.0000 mL | Freq: Two times a day (BID) | OROMUCOSAL | 0 refills | Status: DC

## 2016-05-13 ENCOUNTER — Other Ambulatory Visit: Payer: Self-pay | Admitting: Internal Medicine

## 2016-05-13 MED ORDER — PANTOPRAZOLE SODIUM 40 MG PO TBEC: 40.0000 mg | DELAYED_RELEASE_TABLET | Freq: Every day | ORAL | 1 refills | Status: DC

## 2016-05-14 ENCOUNTER — Telehealth: Payer: Self-pay | Admitting: Gastroenterology

## 2016-05-14 NOTE — Telephone Encounter (Signed)
I am afraid I do not have any office appts open sooner than that, and I cannot know what other advice to give until I can see him.  If there is not another MD or PA appt available sooner with [place], then this man does not sound like he can wait and needs to go to the hospital.

## 2016-05-14 NOTE — Telephone Encounter (Signed)
The pt's daughter called and states that the pt was seen in the ED and PCP and was given additional medications to take ( bentyl, gas ex, carafate, Gi cocktail, lidocaine (he was allergic to that) and prilosec and has had no relief.  The daughter states that the pt is vomiting his food and sweating whenever he eats, He is pacing the floor.  I advised if he is in that much discomfort he may need to go to the ED for evaluation.  She states she will ask the pt is he wants to go and if not will call back.  He has an appt with Dr [name] on 06/05/16.  I will forward to Dr [name] for review.

## 2016-05-15 NOTE — Telephone Encounter (Signed)
The daughter was notified to keep appt as scheduled and if the discomfort continues he will need to go to the ED for eval per Dr [name]. The pt's daughter agrees and verbalized understanding.

## 2016-05-16 ENCOUNTER — Encounter (HOSPITAL_BASED_OUTPATIENT_CLINIC_OR_DEPARTMENT_OTHER): Payer: Self-pay

## 2016-05-16 ENCOUNTER — Emergency Department (HOSPITAL_BASED_OUTPATIENT_CLINIC_OR_DEPARTMENT_OTHER)
Admission: EM | Admit: 2016-05-16 | Discharge: 2016-05-16 | Disposition: A | Discharge status: Home / Self Care | Payer: Commercial Managed Care - HMO | Attending: Emergency Medicine | Admitting: Emergency Medicine

## 2016-05-16 DIAGNOSIS — Z79899 Other long term (current) drug therapy: Secondary | ICD-10-CM | POA: Diagnosis not present

## 2016-05-16 DIAGNOSIS — R112 Nausea with vomiting, unspecified: Secondary | ICD-10-CM | POA: Diagnosis not present

## 2016-05-16 DIAGNOSIS — I10 Essential (primary) hypertension: Secondary | ICD-10-CM | POA: Insufficient documentation

## 2016-05-16 DIAGNOSIS — Z87891 Personal history of nicotine dependence: Secondary | ICD-10-CM | POA: Diagnosis not present

## 2016-05-16 DIAGNOSIS — Z7982 Long term (current) use of aspirin: Secondary | ICD-10-CM | POA: Insufficient documentation

## 2016-05-16 DIAGNOSIS — D72820 Lymphocytosis (symptomatic): Secondary | ICD-10-CM | POA: Diagnosis not present

## 2016-05-16 LAB — CBC WITH DIFFERENTIAL/PLATELET
BASOS PCT: 0 %
Basophils Absolute: 0 10*3/uL (ref 0.0–0.1)
EOS ABS: 0 10*3/uL (ref 0.0–0.7)
EOS PCT: 0 %
HEMATOCRIT: 39.9 % (ref 39.0–52.0)
HEMOGLOBIN: 13.4 g/dL (ref 13.0–17.0)
LYMPHS PCT: 75 %
Lymphs Abs: 10.5 10*3/uL — ABNORMAL HIGH (ref 0.7–4.0)
MCH: 29.7 pg (ref 26.0–34.0)
MCHC: 33.6 g/dL (ref 30.0–36.0)
MCV: 88.5 fL (ref 78.0–100.0)
Monocytes Absolute: 0.8 10*3/uL (ref 0.1–1.0)
Monocytes Relative: 6 %
NEUTROS ABS: 2.7 10*3/uL (ref 1.7–7.7)
NEUTROS PCT: 19 %
PLATELETS: 155 10*3/uL (ref 150–400)
RBC: 4.51 MIL/uL (ref 4.22–5.81)
RDW: 13.6 % (ref 11.5–15.5)
WBC: 14 10*3/uL — ABNORMAL HIGH (ref 4.0–10.5)

## 2016-05-16 LAB — COMPREHENSIVE METABOLIC PANEL
ALT: 16 U/L — ABNORMAL LOW (ref 17–63)
ANION GAP: 6 (ref 5–15)
AST: 19 U/L (ref 15–41)
Albumin: 3.9 g/dL (ref 3.5–5.0)
Alkaline Phosphatase: 42 U/L (ref 38–126)
BILIRUBIN TOTAL: 0.6 mg/dL (ref 0.3–1.2)
BUN: 14 mg/dL (ref 6–20)
CO2: 28 mmol/L (ref 22–32)
Calcium: 9.2 mg/dL (ref 8.9–10.3)
Chloride: 108 mmol/L (ref 101–111)
Creatinine, Ser: 0.96 mg/dL (ref 0.61–1.24)
GFR calc Af Amer: >60 mL/min (ref >60)
Glucose, Bld: 95 mg/dL (ref 65–99)
POTASSIUM: 3.3 mmol/L — AB (ref 3.5–5.1)
Sodium: 142 mmol/L (ref 135–145)
TOTAL PROTEIN: 6.4 g/dL — AB (ref 6.5–8.1)

## 2016-05-16 LAB — TROPONIN I: Troponin I: <0.03 ng/mL (ref <0.03)

## 2016-05-16 LAB — LIPASE, BLOOD: Lipase: 21 U/L (ref 11–51)

## 2016-05-16 MED ORDER — HYDROXYZINE HCL 25 MG PO TABS: 25.0000 mg | ORAL_TABLET | Freq: Every day | ORAL | 0 refills | Status: DC

## 2016-05-16 MED ORDER — PROMETHAZINE HCL 25 MG PO TABS: 25.0000 mg | ORAL_TABLET | Freq: Four times a day (QID) | ORAL | 0 refills | Status: DC | PRN

## 2016-05-16 MED ORDER — GI COCKTAIL ~~LOC~~
30.0000 mL | Freq: Once | ORAL | Status: AC
Start: 2016-05-16 — End: 2016-05-16
  Administered 2016-05-16: 30 mL via ORAL
  Filled 2016-05-16: qty 30

## 2016-05-16 MED ORDER — PROMETHAZINE HCL 25 MG PO TABS
25.0000 mg | ORAL_TABLET | Freq: Once | ORAL | Status: AC
  Administered 2016-05-16: 25 mg via ORAL
  Filled 2016-05-16: qty 1

## 2016-05-16 NOTE — ED Notes (Signed)
Patient tolerated drinking water

## 2016-05-16 NOTE — ED Triage Notes (Signed)
Pt c/o vomiting after eating x 3 weeks-has been seen by PCP and Eva x 2 with multiple tests-GI appt 9/7-concerned with 17lb weight loss within last 3 weeks-NAD

## 2016-05-16 NOTE — ED Provider Notes (Signed)
Emergency Department Provider Note   I have reviewed the triage vital signs and the nursing notes.   HISTORY  Chief Complaint Emesis   HPI Javier [name] is a 75 y.o. male with PMH of HLD, HTN, and pre-diabetes presents to the emergency department for evaluation of continued nausea, abdominal bloating, and vomiting after eating. The patient reports this is been ongoing for many weeks. He has had multiple emergency department visits seen his primary care physician. He is scheduled to have upper endoscopy through his gastrologist on June 02, 2016. The patient's wife has been calling that they've had no cancellations to move his appointment forward. The patient has no associated abdominal pain, chest pain, or diarrhea. No blood in his vomit. No associated fevers. No difficulty breathing. Patient feels localized bloating in his epigastric region. He describes it as a sensation of needing to burp but he cannot. He is been keeping him awake at night and he reports unintentional weight loss. During his recent emergency department visits the patient has had a CT scan and abdominal ultrasound with no specific findings. He's been compliant with his Carafate, Gas-X, and Bentyl with no improvement in symptoms.   Past Medical History:  Diagnosis Date  . Diverticulosis   . Hyperlipidemia   . Hypertension   . Prediabetes   . Vitamin D deficiency     Patient Active Problem List   Diagnosis Date Noted  . Encounter for Medicare annual wellness exam 09/11/2015  . GERD  02/09/2015  . Medication management 11/02/2013  . Hyperlipidemia   . Hypertension   . Prediabetes   . Vitamin D deficiency   . Diverticulosis     Past Surgical History:  Procedure Laterality Date  . NO PAST SURGERIES      Current Outpatient Rx  . Order #: [id number] Class: Historical Med  . Order #: [id number] Class: Normal  . Order #: [id number] Class: Historical Med  . Order #: [id number] Class: Print  . Order #:  [id number] Class: Print  . Order #: [id number] Class: Historical Med  . Order #: [id number] Class: Normal  . Order #: [id number] Class: Normal  . Order #: [id number] Class: Normal  . Order #: [id number] Class: Normal  . Order #: [id number] Class: Normal  . Order #: [id number] Class: Print  . Order #: [id number] Class: Normal  . Order #: [id number] Class: Normal  . Order #: [id number] Class: Print    Allergies Penicillins and Ppd [tuberculin purified protein derivative]  Family History  Problem Relation Age of Onset  . Stroke Mother   . Early death Father     Farm/tractor accident  . Diabetes Brother   . Cirrhosis Brother     Social History Social History  Substance Use Topics  . Smoking status: Former Smoker    Types: Cigarettes    Quit date: 01/17/2003  . Smokeless tobacco: Never Used     Comment: Occasional cigars.  . Alcohol use No    Review of Systems  Constitutional: No fever/chills Eyes: No visual changes. ENT: No sore throat. Cardiovascular: Denies chest pain. Respiratory: Denies shortness of breath. Gastrointestinal: No abdominal pain. Positive epigastric bloating with eating. Positive intermittent nausea and vomiting.  No diarrhea.  No constipation. Genitourinary: Negative for dysuria. Musculoskeletal: Negative for back pain. Skin: Negative for rash. Neurological: Negative for headaches, focal weakness or numbness.  10-point ROS otherwise negative.  ____________________________________________   PHYSICAL EXAM:  VITAL SIGNS: ED Triage Vitals [05/16/16 1526]  Enc Vitals Group     BP 147/85     Pulse Rate 74  Resp 18     Temp 97.8 F (36.6 C)     Temp Source Oral     SpO2 97 %     Weight 168 lb 8 oz (76.4 kg)     Height 5\' 11"  (1.803 m)    Constitutional: Alert and oriented. Well appearing and in no acute distress. Eyes: Conjunctivae are normal. PERRL.  Head: Atraumatic. Nose: No congestion/rhinnorhea. Mouth/Throat: Mucous membranes are moist.  Oropharynx  non-erythematous. Neck: No stridor.   Cardiovascular: Normal rate, regular rhythm. Good peripheral circulation. Grossly normal heart sounds.   Respiratory: Normal respiratory effort.  No retractions. Lungs CTAB. Gastrointestinal: Soft and nontender. No distention.  Musculoskeletal: No lower extremity tenderness nor edema. No gross deformities of extremities. Neurologic:  Normal speech and language. No gross focal neurologic deficits are appreciated.  Skin:  Skin is warm, dry and intact. No rash noted. Psychiatric: Mood and affect are normal. Speech and behavior are normal.  ____________________________________________   LABS (all labs ordered are listed, but only abnormal results are displayed)  Labs Reviewed  COMPREHENSIVE METABOLIC PANEL - Abnormal; Notable for the following:       Result Value   Potassium 3.3 (*)    Total Protein 6.4 (*)    ALT 16 (*)    All other components within normal limits  CBC WITH DIFFERENTIAL/PLATELET - Abnormal; Notable for the following:    WBC 14.0 (*)    Lymphs Abs 10.5 (*)    All other components within normal limits  LIPASE, BLOOD  TROPONIN I  PATHOLOGIST SMEAR REVIEW   ____________________________________________  EKG  Reviewed in MUSE. No STEMI.  ____________________________________________  RADIOLOGY  None ____________________________________________   PROCEDURES  Procedure(s) performed:   Procedures  None ____________________________________________   INITIAL IMPRESSION / ASSESSMENT AND PLAN / ED COURSE  Pertinent labs & imaging results that were available during my care of the patient were reviewed by me and considered in my medical decision making (see chart for details).  Patient resents to the emergency department for evaluation of continued abdominal bloating, nausea, vomiting after eating. No chest pain or associated abdominal pain. Patient is having minimal symptoms at the time of ED presentation. He has scheduled  follow-up with gastroenterology and plan for upper endoscopy on September 4. The patient has wife are concerned that he is not keeping much of his food down and is losing weight. No fevers. Low suspicion for atypical ACS presentation. No other new features to his presentation from last encounter. The patient had a CT scan of the abdomen and pelvis 7/18 that I have reviewed along with abdominal ultrasound. Unclear if this represents esophageal stricture, gastritis, peptic ulcer disease, or hiatal hernia. He does not have evidence of an acute food impaction.   05:00 PM Patient is feeling significant relief with Phenergan and GI cocktail. Cardiac labs and EKG are unremarkable. Repeat abdominal exam with no pain. Do not feel that a repeat CT scan would be beneficial. Wife is asking for something to assist with sleep. We'll prescribe short course of Atarax and Phenergan for home. They will continue using Mylanta as needed and Carafate. Patient has gastroenterology follow-up later in September. Discussed strict return precautions to the emergency department. ____________________________________________  FINAL CLINICAL IMPRESSION(S) / ED DIAGNOSES  Final diagnoses:  Non-intractable vomiting with nausea, vomiting of unspecified type     MEDICATIONS GIVEN DURING THIS VISIT:  Medications  promethazine (PHENERGAN) tablet 25 mg (25 mg Oral Given 05/16/16 1616)  gi cocktail (Maalox,Lidocaine,Donnatal) (30 mLs Oral  Given 05/16/16 1616)     NEW OUTPATIENT MEDICATIONS STARTED DURING THIS VISIT:  New Prescriptions   HYDROXYZINE (ATARAX/VISTARIL) 25 MG TABLET    Take 1 tablet (25 mg total) by mouth at bedtime.   PROMETHAZINE (PHENERGAN) 25 MG TABLET    Take 1 tablet (25 mg total) by mouth every 6 (six) hours as needed for nausea or vomiting.      Note:  This document was prepared using Dragon voice recognition software and may include unintentional dictation errors.  Nanda Quinton, MD Emergency Medicine    Margette Fast, MD 05/16/16 [phone]

## 2016-05-16 NOTE — Discharge Instructions (Signed)
You have been seen in the Emergency Department (ED) today for nausea and vomiting.  Your work up today has not shown a clear cause for your symptoms. You have been prescribed Phenergan; please use as prescribed as needed for your nausea.  Follow up with your doctor as soon as possible regarding today's emergent visit and your symptoms of nausea.   Return to the ED if you develop abdominal, bloody vomiting, bloody diarrhea, if you are unable to tolerate fluids due to vomiting, or if you develop other symptoms that concern you.  

## 2016-05-20 LAB — PATHOLOGIST SMEAR REVIEW

## 2016-05-22 ENCOUNTER — Ambulatory Visit (INDEPENDENT_AMBULATORY_CARE_PROVIDER_SITE_OTHER): Payer: Commercial Managed Care - HMO | Admitting: Gastroenterology

## 2016-05-22 ENCOUNTER — Encounter (INDEPENDENT_AMBULATORY_CARE_PROVIDER_SITE_OTHER): Payer: Self-pay

## 2016-05-22 ENCOUNTER — Encounter: Payer: Self-pay | Admitting: Gastroenterology

## 2016-05-22 VITALS — BP 124/76 | HR 76 | Ht 71.0 in | Wt 170.0 lb

## 2016-05-22 DIAGNOSIS — R634 Abnormal weight loss: Secondary | ICD-10-CM

## 2016-05-22 DIAGNOSIS — R1013 Epigastric pain: Secondary | ICD-10-CM

## 2016-05-22 DIAGNOSIS — G8929 Other chronic pain: Secondary | ICD-10-CM

## 2016-05-22 DIAGNOSIS — R6881 Early satiety: Secondary | ICD-10-CM | POA: Diagnosis not present

## 2016-05-22 DIAGNOSIS — R110 Nausea: Secondary | ICD-10-CM

## 2016-05-22 NOTE — Patient Instructions (Signed)
You have been scheduled for an endoscopy. Please follow written instructions given to you at your visit today. If you use inhalers (even only as needed), please bring them with you on the day of your procedure. Your physician has requested that you go to www.startemmi.com and enter the access code given to you at your visit today. This web site gives a general overview about your procedure. However, you should still follow specific instructions given to you by our office regarding your preparation for the procedure.  If you are age 47 or older, your body mass index should be between 23-30. Your There is no height or weight on file to calculate BMI. If this is out of the aforementioned range listed, please consider follow up with your Primary Care Provider.  If you are age 58 or younger, your body mass index should be between 19-25. Your There is no height or weight on file to calculate BMI. If this is out of the aformentioned range listed, please consider follow up with your Primary Care Provider.   Thank you for choosing [facility]  Dr Wilfrid Lund III

## 2016-05-22 NOTE — Progress Notes (Signed)
GI Progress Note  Chief Complaint: Epigastric pain  Subjective  History:  Javier [name] was referred by primary care for several weeks of chronic epigastric pain bloating and vomiting. He recalls waking one evening with a feeling of intense epigastric pain bloating and nausea as well as loose nonbloody stool. He presented to the ED that day, workup with labs and contrast CT scan abdomen and pelvis was normal. Diarrhea resolved after a couple days, and extensive stool studies were negative. He still has upper abdominal bloating that seems to be worse at night. There is epigastric pain that is nonradiating with some nausea but he no one has vomiting. It just feels like "food is not passing". His wife has become frustrated because he is often up at night pacing the floors and discomfort. He has become afraid to eat and has lost perhaps 10 pounds in the last few weeks. His last colonoscopy in 04/2014 showed benign polyps and diverticulosis ROS: Cardiovascular:  no chest pain Respiratory: no dyspnea  The patient's Past Medical, Family and Social History were reviewed and are on file in the EMR.  Objective:  Med list reviewed  Vital signs in last 24 hrs: Vitals:   05/22/16 1319  BP: 124/76  Pulse: 76    Physical Exam  Thin, no muscle wasting, not acutely ill-appearing, well hydrated  HEENT: sclera anicteric, oral mucosa moist without lesions  Neck: supple, no thyromegaly, JVD or lymphadenopathy  Cardiac: RRR without murmurs, S1S2 heard, no peripheral edema  Pulm: clear to auscultation bilaterally, normal RR and effort noted  Abdomen: soft, mild epigastric tenderness, with active bowel sounds. No guarding or palpable hepatosplenomegaly.  Skin; warm and dry, no jaundice or rash  Recent Labs: CBC    Component Value Date/Time   WBC 14.0 (H) 05/16/2016 1610   RBC 4.51 05/16/2016 1610   HGB 13.4 05/16/2016 1610   HCT 39.9 05/16/2016 1610   PLT 155 05/16/2016 1610   MCV 88.5  05/16/2016 1610   MCH 29.7 05/16/2016 1610   MCHC 33.6 05/16/2016 1610   RDW 13.6 05/16/2016 1610   LYMPHSABS 10.5 (H) 05/16/2016 1610   MONOABS 0.8 05/16/2016 1610   EOSABS 0.0 05/16/2016 1610   BASOSABS 0.0 05/16/2016 1610   CMP Latest Ref Rng & Units 05/16/2016 05/05/2016 04/15/2016  Glucose 65 - 99 mg/dL 95 95 137(H)  BUN 6 - 20 mg/dL 14 8 12   Creatinine 0.61 - 1.24 mg/dL 0.96 1.02 1.32(H)  Sodium 135 - 145 mmol/L 142 140 138  Potassium 3.5 - 5.1 mmol/L 3.3(L) 3.8 3.3(L)  Chloride 101 - 111 mmol/L 108 104 108  CO2 22 - 32 mmol/L 28 25 21(L)  Calcium 8.9 - 10.3 mg/dL 9.2 10.3 9.3  Total Protein 6.5 - 8.1 g/dL 6.4(L) 7.0 6.5  Total Bilirubin 0.3 - 1.2 mg/dL 0.6 0.8 0.5  Alkaline Phos 38 - 126 U/L 42 47 53  AST 15 - 41 U/L 19 24 18   ALT 17 - 63 U/L 16(L) 19 13(L)     Neg standard stool pathogen panel, c diff, O/P  Radiologic studies:  See 7/18 CTAP report (images personally reviewed). There is no gastric dilatation. The stomach wall does not appear thickened, but it is under distended without contrast. No small bowel or colon wall thickening or inflammation is seen. No hepatic lesions are seen.  @ASSESSMENTPLANBEGIN @ Assessment: Encounter Diagnoses  Name Primary?  . Abdominal pain, chronic, epigastric Yes  . Nausea without vomiting   . Early satiety   . Abnormal  loss of weight     Cause of his symptoms is unclear, but his weight loss is very bothersome. The initial part of the illness sounded like acute infection, and perhaps he still has some postinfectious dyspepsia symptoms. Nevertheless, with his age and weight loss, and endoscopic workup for organic causes is warranted.  Plan: EGD tomorrow. He has wife are agreeable, and all the questions were answered.  The benefits and risks of the planned procedure were described in detail with the patient or (when appropriate) their health care proxy.  Risks were outlined as including, but not limited to, bleeding, infection,  perforation, adverse medication reaction leading to cardiac or pulmonary decompensation, or pancreatitis (if ERCP).  The limitation of incomplete mucosal visualization was also discussed.  No guarantees or warranties were given.   Total time 30 minutes, over half spent in counseling and coordination of care.   [name]

## 2016-05-23 ENCOUNTER — Encounter: Payer: Self-pay | Admitting: Gastroenterology

## 2016-05-23 ENCOUNTER — Ambulatory Visit (AMBULATORY_SURGERY_CENTER): Payer: Commercial Managed Care - HMO | Admitting: Gastroenterology

## 2016-05-23 VITALS — BP 145/76 | HR 59 | Temp 98.4°F | Resp 10 | Ht 71.0 in | Wt 170.0 lb

## 2016-05-23 DIAGNOSIS — K2970 Gastritis, unspecified, without bleeding: Secondary | ICD-10-CM

## 2016-05-23 DIAGNOSIS — B9681 Helicobacter pylori [H. pylori] as the cause of diseases classified elsewhere: Secondary | ICD-10-CM | POA: Diagnosis not present

## 2016-05-23 DIAGNOSIS — K219 Gastro-esophageal reflux disease without esophagitis: Secondary | ICD-10-CM | POA: Diagnosis not present

## 2016-05-23 DIAGNOSIS — K319 Disease of stomach and duodenum, unspecified: Secondary | ICD-10-CM | POA: Diagnosis not present

## 2016-05-23 DIAGNOSIS — R1013 Epigastric pain: Secondary | ICD-10-CM | POA: Diagnosis present

## 2016-05-23 DIAGNOSIS — I10 Essential (primary) hypertension: Secondary | ICD-10-CM | POA: Diagnosis not present

## 2016-05-23 DIAGNOSIS — R112 Nausea with vomiting, unspecified: Secondary | ICD-10-CM | POA: Diagnosis not present

## 2016-05-23 DIAGNOSIS — K2950 Unspecified chronic gastritis without bleeding: Secondary | ICD-10-CM | POA: Diagnosis not present

## 2016-05-23 MED ORDER — SODIUM CHLORIDE 0.9 % IV SOLN: 500.0000 mL | INTRAVENOUS | Status: DC

## 2016-05-23 NOTE — Patient Instructions (Signed)
YOU HAD AN ENDOSCOPIC PROCEDURE TODAY AT [facility]:   Refer to the procedure report that was given to you for any specific questions about what was found during the examination.  If the procedure report does not answer your questions, please call your gastroenterologist to clarify.  If you requested that your care partner not be given the details of your procedure findings, then the procedure report has been included in a sealed envelope for you to review at your convenience later.  YOU SHOULD EXPECT: Some feelings of bloating in the abdomen. Passage of more gas than usual.  Walking can help get rid of the air that was put into your GI tract during the procedure and reduce the bloating. If you had a lower endoscopy (such as a colonoscopy or flexible sigmoidoscopy) you may notice spotting of blood in your stool or on the toilet paper. If you underwent a bowel prep for your procedure, you may not have a normal bowel movement for a few days.  Please Note:  You might notice some irritation and congestion in your nose or some drainage.  This is from the oxygen used during your procedure.  There is no need for concern and it should clear up in a day or so.  SYMPTOMS TO REPORT IMMEDIATELY:    Following upper endoscopy (EGD)  Vomiting of blood or coffee ground material  New chest pain or pain under the shoulder blades  Painful or persistently difficult swallowing  New shortness of breath  Fever of 100F or higher  Black, tarry-looking stools  For urgent or emergent issues, a gastroenterologist can be reached at any hour by calling [phone].   DIET:  We do recommend a small meal at first, but then you may proceed to your regular diet.  Drink plenty of fluids but you should avoid alcoholic beverages for 24 hours.  ACTIVITY:  You should plan to take it easy for the rest of today and you should NOT DRIVE or use heavy machinery until tomorrow (because of the sedation medicines used  during the test).    FOLLOW UP: Our staff will call the number listed on your records the next business day following your procedure to check on you and address any questions or concerns that you may have regarding the information given to you following your procedure. If we do not reach you, we will leave a message.  However, if you are feeling well and you are not experiencing any problems, there is no need to return our call.  We will assume that you have returned to your regular daily activities without incident.  If any biopsies were taken you will be contacted by phone or by letter within the next 1-3 weeks.  Please call us at [phone] if you have not heard about the biopsies in 3 weeks.    SIGNATURES/CONFIDENTIALITY: You and/or your care partner have signed paperwork which will be entered into your electronic medical record.  These signatures attest to the fact that that the information above on your After Visit Summary has been reviewed and is understood.  Full responsibility of the confidentiality of this discharge information lies with you and/or your care-partner.  Gastritis-handout given  Wait pathology results.

## 2016-05-23 NOTE — Progress Notes (Signed)
Called to room to assist during endoscopic procedure.  Patient ID and intended procedure confirmed with present staff. Received instructions for my participation in the procedure from the performing physician.  

## 2016-05-23 NOTE — Op Note (Signed)
[name] Procedure Date: 05/23/2016 9:55 AM MRN: [id number] Endoscopist: Fabrica. [name] , MD Age: 75 Referring MD:  Date of Birth: 11-30-1940 Gender: Male Account #: [id number] Procedure:                Upper GI endoscopy Indications:              Epigastric abdominal pain, Abdominal bloating,                            Early satiety, Weight loss Medicines:                Monitored Anesthesia Care Procedure:                Pre-Anesthesia Assessment:                           - Prior to the procedure, a History and Physical                            was performed, and patient medications and                            allergies were reviewed. The patient's tolerance of                            previous anesthesia was also reviewed. The risks                            and benefits of the procedure and the sedation                            options and risks were discussed with the patient.                            All questions were answered, and informed consent                            was obtained. Anticoagulants: The patient has taken                            aspirin. It was decided not to withhold this                            medication prior to the procedure. ASA Grade                            Assessment: II - A patient with mild systemic                            disease. After reviewing the risks and benefits,                            the patient was deemed in satisfactory condition to  undergo the procedure.                           After obtaining informed consent, the endoscope was                            passed under direct vision. Throughout the                            procedure, the patient's blood pressure, pulse, and                            oxygen saturations were monitored continuously. The                            Model GIF-HQ190 [id number]) scope was introduced                     through the mouth, and advanced to the second part                            of duodenum. The upper GI endoscopy was                            accomplished without difficulty. The patient                            tolerated the procedure well. Scope In: Scope Out: Findings:                 The larynx was normal.                           The esophagus was normal.                           Diffuse atrophic mucosa was found in the entire                            examined stomach. Biopsies were taken with a cold                            forceps for histology.                           The cardia and gastric fundus were normal on                            retroflexion.                           The examined duodenum was normal.                           A single 2 mm no bleeding angioectasia was found in  the gastric fundus. Complications:            No immediate complications. Estimated Blood Loss:     Estimated blood loss: none. Impression:               - Normal larynx.                           - Normal esophagus.                           - Gastric mucosal atrophy. Biopsied.                           - Normal examined duodenum.                           - A single non-bleeding angioectasia in the stomach.                           Recent lab and radiologic workup unrevealing. The                            patient appears to have developed a post-infectious                            dyspepsia. Recommendation:           - Patient has a contact number available for                            emergencies. The signs and symptoms of potential                            delayed complications were discussed with the                            patient. Return to normal activities tomorrow.                            Written discharge instructions were provided to the                            patient.                           - Resume  previous diet.                           - Continue present medications.                           - Await pathology results. Victory Dresden L. [name], MD 05/23/2016 10:15:50 AM This report has been signed electronically.

## 2016-05-23 NOTE — Progress Notes (Signed)
Report to PACU, RN, vss, BBS= Clear.  

## 2016-05-23 NOTE — Progress Notes (Signed)
Pt denies any pain or discomfort when chest assessed.-adm

## 2016-05-25 ENCOUNTER — Emergency Department (HOSPITAL_COMMUNITY)
Admission: EM | Admit: 2016-05-25 | Discharge: 2016-05-25 | Disposition: A | Discharge status: Home / Self Care | Payer: Commercial Managed Care - HMO | Attending: Emergency Medicine | Admitting: Emergency Medicine

## 2016-05-25 ENCOUNTER — Encounter (HOSPITAL_COMMUNITY): Payer: Self-pay | Admitting: Emergency Medicine

## 2016-05-25 ENCOUNTER — Emergency Department (HOSPITAL_COMMUNITY): Payer: Commercial Managed Care - HMO

## 2016-05-25 DIAGNOSIS — R1013 Epigastric pain: Secondary | ICD-10-CM | POA: Diagnosis not present

## 2016-05-25 DIAGNOSIS — I10 Essential (primary) hypertension: Secondary | ICD-10-CM | POA: Insufficient documentation

## 2016-05-25 DIAGNOSIS — Z7982 Long term (current) use of aspirin: Secondary | ICD-10-CM | POA: Diagnosis not present

## 2016-05-25 DIAGNOSIS — R195 Other fecal abnormalities: Secondary | ICD-10-CM

## 2016-05-25 DIAGNOSIS — Z87891 Personal history of nicotine dependence: Secondary | ICD-10-CM | POA: Diagnosis not present

## 2016-05-25 DIAGNOSIS — K5900 Constipation, unspecified: Secondary | ICD-10-CM

## 2016-05-25 DIAGNOSIS — K922 Gastrointestinal hemorrhage, unspecified: Secondary | ICD-10-CM | POA: Diagnosis present

## 2016-05-25 DIAGNOSIS — K649 Unspecified hemorrhoids: Secondary | ICD-10-CM

## 2016-05-25 DIAGNOSIS — K644 Residual hemorrhoidal skin tags: Secondary | ICD-10-CM | POA: Diagnosis not present

## 2016-05-25 DIAGNOSIS — Z79899 Other long term (current) drug therapy: Secondary | ICD-10-CM | POA: Diagnosis not present

## 2016-05-25 LAB — COMPREHENSIVE METABOLIC PANEL
ALBUMIN: 3.6 g/dL (ref 3.5–5.0)
ALK PHOS: 48 U/L (ref 38–126)
ALT: 16 U/L — ABNORMAL LOW (ref 17–63)
AST: 17 U/L (ref 15–41)
Anion gap: 6 (ref 5–15)
BILIRUBIN TOTAL: 0.8 mg/dL (ref 0.3–1.2)
BUN: 11 mg/dL (ref 6–20)
CALCIUM: 9.5 mg/dL (ref 8.9–10.3)
CO2: 25 mmol/L (ref 22–32)
Chloride: 111 mmol/L (ref 101–111)
Creatinine, Ser: 1.07 mg/dL (ref 0.61–1.24)
GFR calc Af Amer: >60 mL/min (ref >60)
GFR calc non Af Amer: >60 mL/min (ref >60)
GLUCOSE: 84 mg/dL (ref 65–99)
POTASSIUM: 3.5 mmol/L (ref 3.5–5.1)
SODIUM: 142 mmol/L (ref 135–145)
TOTAL PROTEIN: 6.5 g/dL (ref 6.5–8.1)

## 2016-05-25 LAB — URINALYSIS, ROUTINE W REFLEX MICROSCOPIC
Bilirubin Urine: NEGATIVE
GLUCOSE, UA: NEGATIVE mg/dL
Hgb urine dipstick: NEGATIVE
KETONES UR: NEGATIVE mg/dL
LEUKOCYTES UA: NEGATIVE
NITRITE: NEGATIVE
PROTEIN: NEGATIVE mg/dL
Specific Gravity, Urine: 1.027 (ref 1.005–1.030)
pH: 5.5 (ref 5.0–8.0)

## 2016-05-25 LAB — CBC
HEMATOCRIT: 41 % (ref 39.0–52.0)
HEMOGLOBIN: 13.2 g/dL (ref 13.0–17.0)
MCH: 29.1 pg (ref 26.0–34.0)
MCHC: 32.2 g/dL (ref 30.0–36.0)
MCV: 90.3 fL (ref 78.0–100.0)
Platelets: 159 10*3/uL (ref 150–400)
RBC: 4.54 MIL/uL (ref 4.22–5.81)
RDW: 14 % (ref 11.5–15.5)
WBC: 11.2 10*3/uL — ABNORMAL HIGH (ref 4.0–10.5)

## 2016-05-25 LAB — POC OCCULT BLOOD, ED: FECAL OCCULT BLD: POSITIVE — AB

## 2016-05-25 MED ORDER — IOPAMIDOL (ISOVUE-300) INJECTION 61%
INTRAVENOUS | Status: AC
  Administered 2016-05-25: 100 mL
  Filled 2016-05-25: qty 100

## 2016-05-25 NOTE — ED Provider Notes (Signed)
[facility] DEPT Provider Note   CSN: [id number] Arrival date & time: 05/25/16  0600     History   Chief Complaint Chief Complaint  Patient presents with  . GI Bleeding  . Weakness    HPI Javier [name] is a 75 y.o. male.  The history is provided by the patient and medical records.  Weakness    75 y.o. M with hx of diverticulosis, HLP, HTN, prediabetes, vit D deficiency, presenting to the ED for GI bleeding and weakness.  Patient states over the past month or so he has been having intermittent episodes of abdominal pain which he states is more localized to the epigastrium, often with a bloating sensation.  He was seen by Dr. [name] with [facility] on 05/22/16 and had endoscopy done which revealed normal larynx and esophagus, atrophic mucosa which was biopsied, gastric fundus normal, single 30mm angioectasia in gastric fundus and AVM.  States he has been overall doing well since his procedure, no fever, chills, or sweats.  States this morning was his first BM since his procedure which he states appeared "dark" and "blackish" in color.  He denies any bright red bleeding.  States he does feel somewhat weak.  Has been eating and drinking well at home per wife.  He denies any current abdominal pain, nausea, or vomiting but states he does have an "odd" sensation in his lower abdomen.  Patient does take ASA, no other anti-coagulation.  Patient does have hx of diverticulosis.  No prior abdominal surgeries.  Past Medical History:  Diagnosis Date  . Diverticulosis   . Hyperlipidemia   . Hypertension   . Prediabetes   . Vitamin D deficiency     Patient Active Problem List   Diagnosis Date Noted  . Encounter for Medicare annual wellness exam 09/11/2015  . GERD  02/09/2015  . Medication management 11/02/2013  . Hyperlipidemia   . Hypertension   . Prediabetes   . Vitamin D deficiency   . Diverticulosis     Past Surgical History:  Procedure Laterality Date  . NO PAST SURGERIES          Home Medications    Prior to Admission medications   Medication Sig Start Date End Date Taking? Authorizing Provider  aspirin EC 81 MG tablet Take 81 mg by mouth daily.    Historical Provider, MD  benazepril-hydrochlorthiazide (LOTENSIN HCT) 20-25 MG tablet TAKE 1 TABLET EVERY DAY  FOR  BLOOD  PRESSURE 04/15/16   Unk Pinto, MD  Cholecalciferol (VITAMIN D3) 5000 units TABS Take 5,000 Units by mouth daily.    Historical Provider, MD  dicyclomine (BENTYL) 20 MG tablet Take 1 tablet (20 mg total) by mouth 2 (two) times daily. Patient not taking: Reported on 05/23/2016 05/05/16   Margette Fast, MD  mometasone (NASONEX) 50 MCG/ACT nasal spray Place 2 sprays into the nose daily. Patient taking differently: Place 2 sprays into the nose daily as needed.  12/17/15 12/16/16  [name], PA-C  omeprazole (PRILOSEC) 40 MG capsule Take 1 capsule (40 mg total) by mouth daily. Patient not taking: Reported on 05/23/2016 04/24/16   Starlyn Skeans, PA-C  pravastatin (PRAVACHOL) 40 MG tablet TAKE 1 TABLET AT BEDTIME  FOR  CHOLESTEROL 08/02/14   Unk Pinto, MD  promethazine (PHENERGAN) 25 MG tablet Take 1 tablet (25 mg total) by mouth every 6 (six) hours as needed for nausea or vomiting. 05/16/16   Margette Fast, MD  sucralfate (CARAFATE) 1 g tablet  04/29/16  Historical Provider, MD    Family History Family History  Problem Relation Age of Onset  . Stroke Mother   . Early death Father     Farm/tractor accident  . Diabetes Brother   . Cirrhosis Brother   . Colon cancer Paternal Grandfather     Social History Social History  Substance Use Topics  . Smoking status: Former Smoker    Types: Cigarettes    Quit date: 01/17/2003  . Smokeless tobacco: Never Used     Comment: Occasional cigars.  . Alcohol use Yes     Comment: very rare     Allergies   Penicillins and Ppd [tuberculin purified protein derivative]   Review of Systems Review of Systems  Gastrointestinal: Positive  for blood in stool.  Neurological: Positive for weakness.  All other systems reviewed and are negative.    Physical Exam Updated Vital Signs BP 168/87 (BP Location: Right Arm)   Pulse 66   Temp 97.9 F (36.6 C) (Oral)   Resp 18   Ht 5\' 11"  (1.803 m)   Wt 77.1 kg   SpO2 99%   BMI 23.71 kg/m   Physical Exam  Constitutional: He is oriented to person, place, and time. He appears well-developed and well-nourished.  Elderly, NAD  HENT:  Head: Normocephalic and atraumatic.  Mouth/Throat: Oropharynx is clear and moist.  Eyes: Conjunctivae and EOM are normal. Pupils are equal, round, and reactive to light.  Neck: Normal range of motion.  Cardiovascular: Normal rate, regular rhythm and normal heart sounds.   Pulmonary/Chest: Effort normal and breath sounds normal.  Abdominal: Soft. Bowel sounds are normal.  Genitourinary: Rectal exam shows external hemorrhoid.  Genitourinary Comments: Non-thrombosed, non-bleeding external hemorrhoid noted; firmed stool noted in the rectum, light brown in color on glove, no gross blood noted  Musculoskeletal: Normal range of motion.  Neurological: He is alert and oriented to person, place, and time.  Skin: Skin is warm and dry.  Psychiatric: He has a normal mood and affect.  Nursing note and vitals reviewed.    ED Treatments / Results  Labs (all labs ordered are listed, but only abnormal results are displayed) Labs Reviewed  COMPREHENSIVE METABOLIC PANEL - Abnormal; Notable for the following:       Result Value   ALT 16 (*)    All other components within normal limits  CBC - Abnormal; Notable for the following:    WBC 11.2 (*)    All other components within normal limits  POC OCCULT BLOOD, ED - Abnormal; Notable for the following:    Fecal Occult Bld POSITIVE (*)    All other components within normal limits  URINALYSIS, ROUTINE W REFLEX MICROSCOPIC (NOT AT [facility])  TYPE AND SCREEN    EKG  EKG Interpretation None       Radiology Ct  Abdomen Pelvis W Contrast  Result Date: 05/25/2016 CLINICAL DATA:  Lower abdominal pain with GI bleeding. History diverticulosis EXAM: CT ABDOMEN AND PELVIS WITH CONTRAST TECHNIQUE: Multidetector CT imaging of the abdomen and pelvis was performed using the standard protocol following bolus administration of intravenous contrast. CONTRAST:  133mL ISOVUE-300 IOPAMIDOL (ISOVUE-300) INJECTION 61% COMPARISON:  CT abdomen pelvis 04/15/2016 FINDINGS: Lower chest:  Lung bases clear Hepatobiliary: Liver normal in size and contour. No focal liver lesion. Gallbladder and bile ducts normal Pancreas: Negative Spleen: Negative Adrenals/Urinary Tract: Symmetric excretion of contrast by both kidneys. No renal mass or obstruction. No renal calculi. Urinary bladder normal. Stomach/Bowel: Stomach and duodenum normal. Negative for  bowel obstruction. No bowel wall edema. Scattered diverticula in the colon without diverticulitis. No colonic mass or edema. Moderate stool throughout the colon. Normal appendix. Vascular/Lymphatic: Mild atherosclerotic calcification in the aorta and iliac arteries without aneurysm. Negative for lymphadenopathy. Reproductive: No significant bladder wall thickening. Other: No free fluid.  Negative for hernia. Musculoskeletal: Mild lumbar disc and facet degeneration. No acute skeletal abnormality. IMPRESSION: Constipation. No acute abnormality. Negative for diverticulitis. Normal appendix. Electronically Signed   By: Franchot Gallo M.D.   On: 05/25/2016 08:22    Procedures Procedures (including critical care time)  Medications Ordered in ED Medications - No data to display   Initial Impression / Assessment and Plan / ED Course  I have reviewed the triage vital signs and the nursing notes.  Pertinent labs & imaging results that were available during my care of the patient were reviewed by me and considered in my medical decision making (see chart for details).  Clinical Course   75 year old  male here with sensation of generalized weakness and dark stools this morning. He is afebrile and nontoxic. He is hemodynamically stable. His abdomen is overall soft and benign. He does report an "odd" sensation in his lower abdomen. On rectal exam he does have an external hemorrhoid noted as well as firm stool in the rectum. Stool is Hemoccult positive, no gross blood on my exam. Labwork is reassuring, H&H is stable. CT scan revealing constipation. No signs of diverticulitis or bowel perforation. Patient did report this morning was his first bowel movement in about 4 days-- he may have occult positive stools due to hard stools vs from hemorrhoid or combination of the two.  I have lower suspicion for serious GI bleed at this time given normal labs and stable VS.  Recommended stool softener for home.  Follow-up with GI doctor as well as PCP.  Discussed plan with patient and family at bedside, they acknowledged understanding and agreed with plan of care.  Return precautions given for new or worsening symptoms.  Case discussed with attending physician, Dr. Tyrone Nine, who evaluated patient and agrees with assessment and plan of care.  Final Clinical Impressions(s) / ED Diagnoses   Final diagnoses:  Occult blood in stools  Constipation, unspecified constipation type  Hemorrhoids, unspecified hemorrhoid type    New Prescriptions Discharge Medication List as of 05/25/2016  9:34 AM       Larene Pickett, PA-C 05/25/16 [place], DO 05/26/16 [place], DO 05/26/16 [id number]

## 2016-05-25 NOTE — Discharge Instructions (Signed)
As we discussed, there was some trace amounts of blood in the stool, however blood levels were normal. CT scan did show some constipation. Hard stools as well as his external hemorrhoid may be causing his bleeding. Recommend stool softener such as MiraLAX, Colace, or Dulcolax to help ease bowel movements. Recommend to follow-up with Dr. [name] as well as her primary care doctor. Return here for any new or worsening symptoms.

## 2016-05-25 NOTE — ED Notes (Signed)
Pillow provided.

## 2016-05-25 NOTE — ED Triage Notes (Signed)
Pt presents to ER from home with c/o passing blood in stool this morning; pt reports having endoscopy on Thursday; 1st BM this morning since procedure showed blood; pt also states he feels weak all over

## 2016-05-26 ENCOUNTER — Telehealth: Payer: Self-pay

## 2016-05-26 LAB — TYPE AND SCREEN
ABO/RH(D): A POS
ANTIBODY SCREEN: POSITIVE
DAT, IGG: NEGATIVE
UNIT DIVISION: 0
UNIT DIVISION: 0

## 2016-05-26 NOTE — Telephone Encounter (Signed)
  Follow up Call-  Call back number 05/23/2016 05/22/2014  Post procedure Call Back phone  # [phone]  Permission to leave phone message Yes Yes  Some recent data might be hidden     Patient questions:  Do you have a fever, pain , or abdominal swelling? No. Pain Score  0 *  Have you tolerated food without any problems? Yes.    Have you been able to return to your normal activities? Yes.    Do you have any questions about your discharge instructions: Diet   No. Medications  No. Follow up visit  No.  Do you have questions or concerns about your Care? No.  Actions: * If pain score is 4 or above: No action needed, pain <4.

## 2016-05-28 ENCOUNTER — Telehealth: Payer: Self-pay | Admitting: Gastroenterology

## 2016-05-28 NOTE — Telephone Encounter (Signed)
The pt's wife has been notified that the results have not been reviewed and we will call with Dr Corena Pilgrim recommendations as soon as available

## 2016-05-29 ENCOUNTER — Other Ambulatory Visit: Payer: Self-pay

## 2016-05-29 MED ORDER — METRONIDAZOLE 250 MG PO TABS: 250.0000 mg | ORAL_TABLET | Freq: Four times a day (QID) | ORAL | 0 refills | Status: DC

## 2016-05-29 MED ORDER — DOXYCYCLINE HYCLATE 100 MG PO CAPS: 100.0000 mg | ORAL_CAPSULE | Freq: Two times a day (BID) | ORAL | 0 refills | Status: DC

## 2016-05-29 MED ORDER — BISMUTH SUBSALICYLATE 525 MG/15ML PO SUSP: 525.0000 mg | Freq: Four times a day (QID) | ORAL | 0 refills | Status: DC

## 2016-05-30 ENCOUNTER — Telehealth: Payer: Self-pay | Admitting: Gastroenterology

## 2016-05-30 NOTE — Telephone Encounter (Signed)
The pt is complaining of a feeling of bloating and gas.  He is having normal stools and is on abx for h pylori.  He was advised to try gas ex or IBguard and will call back if no better over the weekend.

## 2016-05-31 ENCOUNTER — Emergency Department (HOSPITAL_COMMUNITY)
Admission: EM | Admit: 2016-05-31 | Discharge: 2016-05-31 | Disposition: A | Discharge status: Home / Self Care | Payer: Commercial Managed Care - HMO | Attending: Emergency Medicine | Admitting: Emergency Medicine

## 2016-05-31 ENCOUNTER — Encounter (HOSPITAL_COMMUNITY): Payer: Self-pay

## 2016-05-31 DIAGNOSIS — Z7982 Long term (current) use of aspirin: Secondary | ICD-10-CM | POA: Insufficient documentation

## 2016-05-31 DIAGNOSIS — R109 Unspecified abdominal pain: Secondary | ICD-10-CM | POA: Diagnosis not present

## 2016-05-31 DIAGNOSIS — Z79899 Other long term (current) drug therapy: Secondary | ICD-10-CM | POA: Diagnosis not present

## 2016-05-31 DIAGNOSIS — Z87891 Personal history of nicotine dependence: Secondary | ICD-10-CM | POA: Diagnosis not present

## 2016-05-31 DIAGNOSIS — I10 Essential (primary) hypertension: Secondary | ICD-10-CM | POA: Insufficient documentation

## 2016-05-31 DIAGNOSIS — R140 Abdominal distension (gaseous): Secondary | ICD-10-CM

## 2016-05-31 DIAGNOSIS — A048 Other specified bacterial intestinal infections: Secondary | ICD-10-CM

## 2016-05-31 DIAGNOSIS — A045 Campylobacter enteritis: Secondary | ICD-10-CM | POA: Insufficient documentation

## 2016-05-31 DIAGNOSIS — R112 Nausea with vomiting, unspecified: Secondary | ICD-10-CM | POA: Diagnosis not present

## 2016-05-31 LAB — CBC
HEMATOCRIT: 42.6 % (ref 39.0–52.0)
HEMOGLOBIN: 13.7 g/dL (ref 13.0–17.0)
MCH: 29 pg (ref 26.0–34.0)
MCHC: 32.2 g/dL (ref 30.0–36.0)
MCV: 90.3 fL (ref 78.0–100.0)
Platelets: 142 10*3/uL — ABNORMAL LOW (ref 150–400)
RBC: 4.72 MIL/uL (ref 4.22–5.81)
RDW: 13.7 % (ref 11.5–15.5)
WBC: 10 10*3/uL (ref 4.0–10.5)

## 2016-05-31 LAB — COMPREHENSIVE METABOLIC PANEL
ALBUMIN: 3.7 g/dL (ref 3.5–5.0)
ALK PHOS: 47 U/L (ref 38–126)
ALT: 15 U/L — ABNORMAL LOW (ref 17–63)
ANION GAP: 7 (ref 5–15)
AST: 23 U/L (ref 15–41)
BUN: 6 mg/dL (ref 6–20)
CALCIUM: 9.9 mg/dL (ref 8.9–10.3)
CHLORIDE: 106 mmol/L (ref 101–111)
CO2: 27 mmol/L (ref 22–32)
Creatinine, Ser: 1.03 mg/dL (ref 0.61–1.24)
GFR calc non Af Amer: >60 mL/min (ref >60)
GLUCOSE: 90 mg/dL (ref 65–99)
Potassium: 4.1 mmol/L (ref 3.5–5.1)
SODIUM: 140 mmol/L (ref 135–145)
Total Bilirubin: 0.7 mg/dL (ref 0.3–1.2)
Total Protein: 6.5 g/dL (ref 6.5–8.1)

## 2016-05-31 LAB — URINALYSIS, ROUTINE W REFLEX MICROSCOPIC
BILIRUBIN URINE: NEGATIVE
GLUCOSE, UA: NEGATIVE mg/dL
HGB URINE DIPSTICK: NEGATIVE
Ketones, ur: NEGATIVE mg/dL
Leukocytes, UA: NEGATIVE
Nitrite: NEGATIVE
PH: 5.5 (ref 5.0–8.0)
Protein, ur: NEGATIVE mg/dL
SPECIFIC GRAVITY, URINE: 1.013 (ref 1.005–1.030)

## 2016-05-31 LAB — TROPONIN I

## 2016-05-31 LAB — LIPASE, BLOOD: LIPASE: 21 U/L (ref 11–51)

## 2016-05-31 MED ORDER — ONDANSETRON 4 MG PO TBDP: 4.0000 mg | ORAL_TABLET | Freq: Four times a day (QID) | ORAL | 0 refills | Status: DC | PRN

## 2016-05-31 MED ORDER — METRONIDAZOLE 500 MG PO TABS
500.0000 mg | ORAL_TABLET | Freq: Once | ORAL | Status: AC
  Administered 2016-05-31: 500 mg via ORAL
  Filled 2016-05-31: qty 1

## 2016-05-31 MED ORDER — ONDANSETRON 4 MG PO TBDP
4.0000 mg | ORAL_TABLET | Freq: Once | ORAL | Status: AC
  Administered 2016-05-31: 4 mg via ORAL
  Filled 2016-05-31: qty 1

## 2016-05-31 MED ORDER — DEXTROSE 5 % AND 0.45 % NACL IV BOLUS
1000.0000 mL | Freq: Once | INTRAVENOUS | Status: AC
  Administered 2016-05-31: 1000 mL via INTRAVENOUS

## 2016-05-31 MED ORDER — POLYETHYLENE GLYCOL 3350 17 G PO PACK
17.0000 g | PACK | Freq: Every day | ORAL | Status: DC
  Filled 2016-05-31: qty 1

## 2016-05-31 MED ORDER — ONDANSETRON HCL 4 MG/2ML IJ SOLN
4.0000 mg | Freq: Once | INTRAMUSCULAR | Status: AC
  Administered 2016-05-31: 4 mg via INTRAVENOUS
  Filled 2016-05-31: qty 2

## 2016-05-31 MED ORDER — DOXYCYCLINE HYCLATE 100 MG PO TABS
100.0000 mg | ORAL_TABLET | Freq: Once | ORAL | Status: AC
  Administered 2016-05-31: 100 mg via ORAL
  Filled 2016-05-31: qty 1

## 2016-05-31 NOTE — ED Provider Notes (Signed)
[facility] DEPT Provider Note   CSN: [id number] Arrival date & time: 05/31/16  1049     History   Chief Complaint Chief Complaint  Patient presents with  . Abdominal Pain    HPI Javier [name] is a 75 y.o. male.  HPI  75 y.o. male with PMH of HLD, HTN, and pre-diabetes comes in with cc of n.v.ab d discomfort. Pt is s/p recent upper EGD and was diagnosed with H pylori infection. He reports that the early satiety, bloating, nausea, anorexia have been persistent. He started the antibiotics yday, but he has been having emesis since last night and was unable to keep meds down. Pt had > 5 episodes of emesis overnight, no blood. Normal BM.  Past Medical History:  Diagnosis Date  . Diverticulosis   . Hyperlipidemia   . Hypertension   . Prediabetes   . Vitamin D deficiency     Patient Active Problem List   Diagnosis Date Noted  . Encounter for Medicare annual wellness exam 09/11/2015  . GERD  02/09/2015  . Medication management 11/02/2013  . Hyperlipidemia   . Hypertension   . Prediabetes   . Vitamin D deficiency   . Diverticulosis     Past Surgical History:  Procedure Laterality Date  . NO PAST SURGERIES         Home Medications    Prior to Admission medications   Medication Sig Start Date End Date Taking? Authorizing Provider  aspirin EC 81 MG tablet Take 81 mg by mouth daily.   Yes Historical Provider, MD  benazepril-hydrochlorthiazide (LOTENSIN HCT) 20-25 MG tablet TAKE 1 TABLET EVERY DAY  FOR  BLOOD  PRESSURE 04/15/16  Yes Unk Pinto, MD  Cholecalciferol (VITAMIN D3) 5000 units TABS Take 5,000 Units by mouth daily.   Yes Historical Provider, MD  doxycycline (VIBRAMYCIN) 100 MG capsule Take 1 capsule (100 mg total) by mouth 2 (two) times daily. 05/29/16 06/08/16 Yes [name], MD  metroNIDAZOLE (FLAGYL) 250 MG tablet Take 1 tablet (250 mg total) by mouth 4 (four) times daily. 05/29/16 06/08/16 Yes [name], MD  promethazine (PHENERGAN) 25  MG tablet Take 1 tablet (25 mg total) by mouth every 6 (six) hours as needed for nausea or vomiting. 05/16/16  Yes Margette Fast, MD  Bismuth Subsalicylate [id number] SUSP Take 15 mLs (525 mg total) by mouth 4 (four) times daily. Patient not taking: Reported on 05/31/2016 05/29/16 06/08/16  [name], MD  dicyclomine (BENTYL) 20 MG tablet Take 1 tablet (20 mg total) by mouth 2 (two) times daily. Patient not taking: Reported on 05/23/2016 05/05/16   Margette Fast, MD  mometasone (NASONEX) 50 MCG/ACT nasal spray Place 2 sprays into the nose daily. Patient not taking: Reported on 05/25/2016 12/17/15 12/16/16  Loma Sousa Forcucci, PA-C  omeprazole (PRILOSEC) 40 MG capsule Take 1 capsule (40 mg total) by mouth daily. Patient not taking: Reported on 05/23/2016 04/24/16   Loma Sousa Forcucci, PA-C  ondansetron (ZOFRAN ODT) 4 MG disintegrating tablet Take 1 tablet (4 mg total) by mouth every 6 (six) hours as needed for nausea or vomiting (30). 05/31/16   Varney Biles, MD  pravastatin (PRAVACHOL) 40 MG tablet TAKE 1 TABLET AT BEDTIME  FOR  CHOLESTEROL Patient taking differently: TAKE 1 TABLET AT daytime FOR  CHOLESTEROL 08/02/14   Unk Pinto, MD    Family History Family History  Problem Relation Age of Onset  . Stroke Mother   . Early death Father  Farm/tractor accident  . Diabetes Brother   . Cirrhosis Brother   . Colon cancer Paternal Grandfather     Social History Social History  Substance Use Topics  . Smoking status: Former Smoker    Types: Cigarettes    Quit date: 01/17/2003  . Smokeless tobacco: Never Used     Comment: Occasional cigars.  . Alcohol use Yes     Comment: very rare     Allergies   Penicillins and Ppd [tuberculin purified protein derivative]   Review of Systems Review of Systems  ROS 10 Systems reviewed and are negative for acute change except as noted in the HPI.     Physical Exam Updated Vital Signs BP 176/87   Pulse 66   Temp 98.1 F (36.7 C) (Oral)    Resp 13   SpO2 99%   Physical Exam  Constitutional: He is oriented to person, place, and time. He appears well-developed.  HENT:  Head: Atraumatic.  Neck: Neck supple.  Cardiovascular: Normal rate.   Pulmonary/Chest: Effort normal.  Abdominal: Soft. There is no tenderness.  Neurological: He is alert and oriented to person, place, and time.  Skin: Skin is warm. Capillary refill takes less than 2 seconds.  Nursing note and vitals reviewed.    ED Treatments / Results  Labs (all labs ordered are listed, but only abnormal results are displayed) Labs Reviewed  COMPREHENSIVE METABOLIC PANEL - Abnormal; Notable for the following:       Result Value   ALT 15 (*)    All other components within normal limits  CBC - Abnormal; Notable for the following:    Platelets 142 (*)    All other components within normal limits  URINE CULTURE  LIPASE, BLOOD  URINALYSIS, ROUTINE W REFLEX MICROSCOPIC (NOT AT [facility])  TROPONIN I    EKG  EKG Interpretation None       Radiology No results found.  Procedures Procedures (including critical care time)  Medications Ordered in ED Medications  polyethylene glycol (MIRALAX / GLYCOLAX) packet 17 g (not administered)  dextrose 5 % and 0.45% NaCl 5-0.45 % bolus 1,000 mL (0 mLs Intravenous Stopped 05/31/16 1510)  ondansetron (ZOFRAN) injection 4 mg (4 mg Intravenous Given 05/31/16 1301)  metroNIDAZOLE (FLAGYL) tablet 500 mg (500 mg Oral Given 05/31/16 1632)  doxycycline (VIBRA-TABS) tablet 100 mg (100 mg Oral Given 05/31/16 1632)  ondansetron (ZOFRAN-ODT) disintegrating tablet 4 mg (4 mg Oral Given 05/31/16 1633)     PREVIOUS WORKUP  CT SCAN ON 05/25/16: IMPRESSION: Constipation.  No acute abnormality. Negative for diverticulitis. Normal appendix.  Upper endoscopy 05/25/16: - Normal larynx. - Normal esophagus. - Gastric mucosal atrophy. Biopsied. - Normal examined duodenum. - A single non-bleeding angioectasia in the stomach. Recent lab and  radiologic workup unrevealing. The patient appears to have developed a postinfectious dyspepsia.  Lower Endoscopy: 04/2014: ENDOSCOPIC IMPRESSION: 1. Sessile polyp measuring 3 mm in size was found in the ascending colon; polypectomy was performed with a cold snare 2. Sessile polyp measuring 5 mm in size was found in the proximal transverse colon; polypectomy was performed with a cold snare 3. Sessile polyp measuring 4 mm in size was found in the descending colon; polypectomy was performed with a cold snare 4. Semi-pedunculated polyp measuring 10 mm in size was found in the sigmoid colon; polypectomy was performed with a cold snare 5. diverticulosis  Initial Impression / Assessment and Plan / ED Course  I have reviewed the triage vital signs and the nursing notes.  Pertinent labs & imaging results that were available during my care of the patient were reviewed by me and considered in my medical decision making (see chart for details).  Clinical Course  Comment By Time  Labs reassuring. PO challenged - pt did have 1 episode of emesis. We feel comfortable sending home at this time though - with nausea meds. Strict ER return precautions have been discussed, and patient is agreeing with the plan and is comfortable with the workup done and the recommendations from the ER.  Varney Biles, MD 09/02 1540    Pt comes in with cc of abd pain, n/v, anorexia. Recent hx of Hpylori - based on those sx. Pt is now having emesis -which could be side effect of medicine, or related to the gastritis. Recent CT and EGD unremarkable and the abd exam is non concerning. Will start oral challenge. D/c if pt is doing well, with strict return precautions.  Final Clinical Impressions(s) / ED Diagnoses   Final diagnoses:  Abdominal bloating  Non-intractable vomiting with nausea, vomiting of unspecified type  H. pylori infection    New Prescriptions New Prescriptions   ONDANSETRON (ZOFRAN ODT) 4 MG  DISINTEGRATING TABLET    Take 1 tablet (4 mg total) by mouth every 6 (six) hours as needed for nausea or vomiting (30).     Varney Biles, MD 05/31/16 1654

## 2016-05-31 NOTE — Discharge Instructions (Signed)
PLEASE READ THE INSTRUCTION ON H. PYLORI INFECTION PROVIDED. Take nausea meds as needed. Anticipate long battle with this bug.  Return to the Er if the symptoms get worse.

## 2016-05-31 NOTE — ED Notes (Signed)
Water given to pt 

## 2016-05-31 NOTE — ED Notes (Signed)
Pt tolerated cup of water. 

## 2016-05-31 NOTE — ED Triage Notes (Signed)
Patient had endoscopy 9 days ago and was called this past week and told he needed to start antibiotics for h.pylori. Started meds on Thursday and recurrent nausea and vomiting and states that he feels bloated. Not sure if related to the flagyl and doxy or something else. Had the endo for vomiting and constipation

## 2016-06-01 LAB — URINE CULTURE: Culture: NO GROWTH

## 2016-06-02 ENCOUNTER — Encounter (HOSPITAL_COMMUNITY): Payer: Self-pay

## 2016-06-02 ENCOUNTER — Emergency Department (HOSPITAL_COMMUNITY)
Admission: EM | Admit: 2016-06-02 | Discharge: 2016-06-02 | Disposition: A | Discharge status: Home / Self Care | Payer: Commercial Managed Care - HMO | Attending: Emergency Medicine | Admitting: Emergency Medicine

## 2016-06-02 DIAGNOSIS — R1010 Upper abdominal pain, unspecified: Secondary | ICD-10-CM

## 2016-06-02 DIAGNOSIS — Z87891 Personal history of nicotine dependence: Secondary | ICD-10-CM | POA: Diagnosis not present

## 2016-06-02 DIAGNOSIS — Z79899 Other long term (current) drug therapy: Secondary | ICD-10-CM | POA: Diagnosis not present

## 2016-06-02 DIAGNOSIS — I10 Essential (primary) hypertension: Secondary | ICD-10-CM | POA: Insufficient documentation

## 2016-06-02 DIAGNOSIS — R112 Nausea with vomiting, unspecified: Secondary | ICD-10-CM | POA: Diagnosis not present

## 2016-06-02 DIAGNOSIS — Z7982 Long term (current) use of aspirin: Secondary | ICD-10-CM | POA: Diagnosis not present

## 2016-06-02 LAB — CBC
HEMATOCRIT: 43.7 % (ref 39.0–52.0)
HEMOGLOBIN: 14.3 g/dL (ref 13.0–17.0)
MCH: 29.6 pg (ref 26.0–34.0)
MCHC: 32.7 g/dL (ref 30.0–36.0)
MCV: 90.5 fL (ref 78.0–100.0)
Platelets: 171 10*3/uL (ref 150–400)
RBC: 4.83 MIL/uL (ref 4.22–5.81)
RDW: 14 % (ref 11.5–15.5)
WBC: 10.6 10*3/uL — AB (ref 4.0–10.5)

## 2016-06-02 LAB — COMPREHENSIVE METABOLIC PANEL
ALBUMIN: 4.1 g/dL (ref 3.5–5.0)
ALT: 25 U/L (ref 17–63)
ANION GAP: 6 (ref 5–15)
AST: 41 U/L (ref 15–41)
Alkaline Phosphatase: 51 U/L (ref 38–126)
BILIRUBIN TOTAL: 0.9 mg/dL (ref 0.3–1.2)
BUN: 8 mg/dL (ref 6–20)
CHLORIDE: 106 mmol/L (ref 101–111)
CO2: 28 mmol/L (ref 22–32)
Calcium: 10.1 mg/dL (ref 8.9–10.3)
Creatinine, Ser: 1.14 mg/dL (ref 0.61–1.24)
GFR calc Af Amer: >60 mL/min (ref >60)
GFR calc non Af Amer: >60 mL/min (ref >60)
GLUCOSE: 90 mg/dL (ref 65–99)
POTASSIUM: 3.8 mmol/L (ref 3.5–5.1)
Sodium: 140 mmol/L (ref 135–145)
TOTAL PROTEIN: 6.6 g/dL (ref 6.5–8.1)

## 2016-06-02 LAB — URINALYSIS, ROUTINE W REFLEX MICROSCOPIC
Bilirubin Urine: NEGATIVE
GLUCOSE, UA: NEGATIVE mg/dL
Hgb urine dipstick: NEGATIVE
Ketones, ur: 15 mg/dL — AB
LEUKOCYTES UA: NEGATIVE
NITRITE: NEGATIVE
PH: 7.5 (ref 5.0–8.0)
Protein, ur: NEGATIVE mg/dL
SPECIFIC GRAVITY, URINE: 1.018 (ref 1.005–1.030)

## 2016-06-02 LAB — URINE MICROSCOPIC-ADD ON: RBC / HPF: NONE SEEN RBC/hpf (ref 0–5)

## 2016-06-02 LAB — LIPASE, BLOOD: LIPASE: 22 U/L (ref 11–51)

## 2016-06-02 MED ORDER — METOCLOPRAMIDE HCL 5 MG PO TABS: 5.0000 mg | ORAL_TABLET | Freq: Three times a day (TID) | ORAL | 0 refills | Status: DC | PRN

## 2016-06-02 MED ORDER — PANTOPRAZOLE SODIUM 40 MG PO TBEC: 40.0000 mg | DELAYED_RELEASE_TABLET | Freq: Every day | ORAL | 0 refills | Status: DC

## 2016-06-02 MED ORDER — FAMOTIDINE IN NACL 20-0.9 MG/50ML-% IV SOLN
20.0000 mg | Freq: Two times a day (BID) | INTRAVENOUS | Status: DC
  Administered 2016-06-02: 20 mg via INTRAVENOUS
  Filled 2016-06-02: qty 50

## 2016-06-02 MED ORDER — PROMETHAZINE HCL 25 MG/ML IJ SOLN
12.5000 mg | Freq: Once | INTRAMUSCULAR | Status: AC
  Administered 2016-06-02: 12.5 mg via INTRAVENOUS
  Filled 2016-06-02: qty 1

## 2016-06-02 NOTE — ED Notes (Signed)
Pt drank a few sips of water and did not tolerate it well. Pt vomited a couple of times. Informed Brooke - Therapist, sports.

## 2016-06-02 NOTE — ED Notes (Signed)
Pt family member came to nurses' station requesting pt be given PO food and fluids. This RN explained pt needs to be evaluated by EDP before eating d/t presentation of abd pain and vomiting after eating/drinking. Family verbalized understanding, no further questions/concerns at this time.

## 2016-06-02 NOTE — ED Provider Notes (Signed)
[facility] DEPT Provider Note   CSN: [id number] Arrival date & time: 06/02/16  1431     History   Chief Complaint Chief Complaint  Patient presents with  . Emesis  . Abdominal Pain    HPI Javier [name] is a 75 y.o. male.  HPI Patient is a 75 year old male with a past medical history of diverticulosis, hyperlipidemia, hypertension who comes in today complaining of nausea and vomiting. Patient recently diagnosed with H. pylori and has begun antibiotic therapy. Since initiating treatment, patient has had intractable nausea and vomiting. Patient is come to the ED 5 times in the past 2 weeks waning of intractable nausea and vomiting. Patient received Zofran in the ED and was able to tolerate by mouth prior to leaving each time; however, upon returning home patient would resume normal pattern of having emesis after every by mouth intake. Patient denies fevers chills endorses nausea and vomiting as well as some abdominal discomfort. Patient also endorses heartburn. Patient states he was previously placed on a PPI however he did not feel it was benefiting him and ceased. Past Medical History:  Diagnosis Date  . Diverticulosis   . Hyperlipidemia   . Hypertension   . Prediabetes   . Vitamin D deficiency     Patient Active Problem List   Diagnosis Date Noted  . Encounter for Medicare annual wellness exam 09/11/2015  . GERD  02/09/2015  . Medication management 11/02/2013  . Hyperlipidemia   . Hypertension   . Prediabetes   . Vitamin D deficiency   . Diverticulosis     Past Surgical History:  Procedure Laterality Date  . NO PAST SURGERIES         Home Medications    Prior to Admission medications   Medication Sig Start Date End Date Taking? Authorizing Provider  aspirin EC 81 MG tablet Take 81 mg by mouth daily.   Yes Historical Provider, MD  benazepril-hydrochlorthiazide (LOTENSIN HCT) 20-25 MG tablet TAKE 1 TABLET EVERY DAY  FOR  BLOOD  PRESSURE 04/15/16  Yes Unk Pinto, MD  Cholecalciferol (VITAMIN D3) 5000 units TABS Take 5,000 Units by mouth daily.   Yes Historical Provider, MD  pravastatin (PRAVACHOL) 40 MG tablet TAKE 1 TABLET AT BEDTIME  FOR  CHOLESTEROL Patient taking differently: TAKE 1 TABLET BY MOUTH DAILY FOR CHOLESTEROL 08/02/14  Yes Unk Pinto, MD  bifidobacterium infantis (ALIGN) capsule Take 1 capsule by mouth daily. Patient not taking: Reported on 06/05/2016 06/05/16   April Palumbo, MD  metoCLOPramide (REGLAN) 5 MG tablet Take 1 tablet (5 mg total) by mouth every 8 (eight) hours as needed for nausea or vomiting. 06/05/16   [name], MD  ondansetron (ZOFRAN ODT) 8 MG disintegrating tablet Take every 8 hours for the next day and wait 15 minutes before attempting to eat or drink, then q8 hours PRN Patient not taking: Reported on 06/05/2016 06/05/16   April Palumbo, MD  pantoprazole (PROTONIX) 40 MG tablet Take 1 tablet (40 mg total) by mouth daily. Patient not taking: Reported on 06/05/2016 06/02/16   Sherwood Gambler, MD  sucralfate (CARAFATE) 1 GM/10ML suspension Take 10 mLs (1 g total) by mouth 4 (four) times daily -  with meals and at bedtime. Patient not taking: Reported on 06/05/2016 06/05/16   April Palumbo, MD    Family History Family History  Problem Relation Age of Onset  . Stroke Mother   . Early death Father     Farm/tractor accident  . Diabetes Brother   .  Cirrhosis Brother   . Colon cancer Paternal Grandfather     Social History Social History  Substance Use Topics  . Smoking status: Former Smoker    Types: Cigarettes    Quit date: 01/17/2003  . Smokeless tobacco: Never Used     Comment: Occasional cigars.  . Alcohol use Yes     Comment: very rare     Allergies   Penicillins and Ppd [tuberculin purified protein derivative]   Review of Systems Review of Systems  Constitutional: Negative for chills and fever.  Respiratory: Negative for chest tightness and shortness of breath.   Cardiovascular: Negative for  chest pain.  Gastrointestinal: Positive for abdominal pain, nausea and vomiting. Negative for abdominal distention.  All other systems reviewed and are negative.    Physical Exam Updated Vital Signs BP 124/68   Pulse (!) 59   Temp 98.3 F (36.8 C) (Oral)   Resp 16   SpO2 99%   Physical Exam  Constitutional: He appears well-developed and well-nourished.  HENT:  Head: Normocephalic and atraumatic.  Eyes: Conjunctivae are normal.  Neck: Neck supple.  Cardiovascular: Normal rate and regular rhythm.   No murmur heard. Pulmonary/Chest: Effort normal and breath sounds normal. No respiratory distress.  Abdominal: Soft. He exhibits no distension and no mass. There is tenderness. There is no rebound and no guarding.  Musculoskeletal: He exhibits no edema.  Neurological: He is alert.  Skin: Skin is warm and dry.  Psychiatric: He has a normal mood and affect.  Nursing note and vitals reviewed.    ED Treatments / Results  Labs (all labs ordered are listed, but only abnormal results are displayed) Labs Reviewed  CBC - Abnormal; Notable for the following:       Result Value   WBC 10.6 (*)    All other components within normal limits  URINALYSIS, ROUTINE W REFLEX MICROSCOPIC (NOT AT [facility]) - Abnormal; Notable for the following:    APPearance TURBID (*)    Ketones, ur 15 (*)    All other components within normal limits  URINE MICROSCOPIC-ADD ON - Abnormal; Notable for the following:    Squamous Epithelial / LPF 0-5 (*)    Bacteria, UA RARE (*)    All other components within normal limits  LIPASE, BLOOD  COMPREHENSIVE METABOLIC PANEL    EKG  EKG Interpretation None       Radiology Dg Abd Acute W/chest  Result Date: 06/05/2016 CLINICAL DATA:  Subacute onset of generalized abdominal pain and bloating. Vomiting and diarrhea. Initial encounter. EXAM: DG ABDOMEN ACUTE W/ 1V CHEST COMPARISON:  CT of the abdomen and pelvis performed 05/25/2016, and chest radiograph performed  07/30/2007 FINDINGS: The lungs are well-aerated and clear. There is no evidence of focal opacification, pleural effusion or pneumothorax. The cardiomediastinal silhouette is within normal limits. The visualized bowel gas pattern is unremarkable. Scattered stool and air are seen within the colon; there is no evidence of small bowel dilatation to suggest obstruction. No free intra-abdominal air is identified on the provided decubitus view. No acute osseous abnormalities are seen; the sacroiliac joints are unremarkable in appearance. IMPRESSION: 1. Unremarkable bowel gas pattern; no free intra-abdominal air seen. Small to moderate amount of stool noted in the colon. 2. No acute cardiopulmonary process seen. Electronically Signed   By: Garald Balding M.D.   On: 06/05/2016 04:32    Procedures Procedures (including critical care time)  Medications Ordered in ED Medications  promethazine (PHENERGAN) injection 12.5 mg (12.5 mg Intravenous Given 06/02/16  1942)     Initial Impression / Assessment and Plan / ED Course  I have reviewed the triage vital signs and the nursing notes.  Pertinent labs & imaging results that were available during my care of the patient were reviewed by me and considered in my medical decision making (see chart for details).  Clinical Course    Patient is a 75 year old male with a past medical history of diverticulosis, hyperlipidemia, hypertension who comes in today complaining of nausea and vomiting. Patient recently diagnosed with H. pylori and has begun antibiotic therapy. Since initiating treatment, patient has had intractable nausea and vomiting. Patient is come to the ED 5 times in the past 2 weeks waning of intractable nausea and vomiting. Patient received Zofran in the ED and was able to tolerate by mouth prior to leaving each time; however, upon returning home patient would resume normal pattern of having emesis after every by mouth intake. Patient denies fevers chills  endorses nausea and vomiting as well as some abdominal discomfort. Patient also endorses heartburn. Patient states he was previously placed on a PPI however he did not feel it was benefiting him and ceased.  Physical exam: Patient clear to auscultation bilaterally normal heart sounds, abdomen is soft with mild diffuse tenderness to palpation. Remainder physical exam within normal limits.   Collect basic labs as well as urinalysis, lipase.  Patient's laboratory workup largely within normal limits.  Patient's wife initially stated that she did not wish to leave without being admitted as she was told on the phone by her husband's GI physician that he would require admission.  Patient given p.o. challenge following antiemetics and was able to tolerate p.o. without difficulty.  I have spoken to the patient and his wife and together it was agreed that patient was suitable to return home.  Patient given appropriate follow-up and return precautions.  Agent and wife voiced understanding are both agreeable to discharge at this time.  Final Clinical Impressions(s) / ED Diagnoses   Final diagnoses:  Nausea and vomiting in adult  Upper abdominal pain    New Prescriptions Discharge Medication List as of 06/02/2016 10:13 PM    START taking these medications   Details  pantoprazole (PROTONIX) 40 MG tablet Take 1 tablet (40 mg total) by mouth daily., Starting Mon 06/02/2016, Print    metoCLOPramide (REGLAN) 5 MG tablet Take 1 tablet (5 mg total) by mouth every 8 (eight) hours as needed for nausea or vomiting., Starting Mon 06/02/2016, Print         Chapman Moss, MD 06/05/16 1810    Sherwood Gambler, MD 06/11/16 [phone]

## 2016-06-02 NOTE — ED Notes (Signed)
Gave pt water, per Dr. Regenia Skeeter. Informed Legrand Como - RN.

## 2016-06-02 NOTE — ED Notes (Signed)
Per resident, okay for pt to be given applesauce for PO challenge.

## 2016-06-02 NOTE — ED Notes (Signed)
Pt states he could tolerated some of the fluids given, when rounding the pt, wife was c/o sharp pain on her ears, and holding ears with her hand, wife encouraged by this nurse to get check, but she refuses to be check states this pains comes and goes away for the past few weeks.

## 2016-06-02 NOTE — ED Notes (Signed)
Pt tolerated PO applesauce w/o difficulty.

## 2016-06-02 NOTE — ED Triage Notes (Signed)
Patient seen on Saturday for abdominal burning with vomiting. Being treated with flagyl for h.pylori and continuing to take flagyl. Now has increased burning and vomiting that started again yesterday, complains of weakness with same

## 2016-06-02 NOTE — ED Notes (Signed)
Resident at bedside.  

## 2016-06-03 ENCOUNTER — Other Ambulatory Visit: Payer: Self-pay | Admitting: Internal Medicine

## 2016-06-05 ENCOUNTER — Ambulatory Visit (INDEPENDENT_AMBULATORY_CARE_PROVIDER_SITE_OTHER): Payer: Commercial Managed Care - HMO | Admitting: Gastroenterology

## 2016-06-05 ENCOUNTER — Emergency Department (HOSPITAL_COMMUNITY): Payer: Commercial Managed Care - HMO

## 2016-06-05 ENCOUNTER — Emergency Department (HOSPITAL_COMMUNITY)
Admission: EM | Admit: 2016-06-05 | Discharge: 2016-06-05 | Disposition: A | Discharge status: Home / Self Care | Payer: Commercial Managed Care - HMO | Attending: Emergency Medicine | Admitting: Emergency Medicine

## 2016-06-05 ENCOUNTER — Encounter (HOSPITAL_COMMUNITY): Payer: Self-pay

## 2016-06-05 ENCOUNTER — Encounter: Payer: Self-pay | Admitting: Gastroenterology

## 2016-06-05 VITALS — BP 134/86 | HR 64 | Ht 70.5 in | Wt 168.1 lb

## 2016-06-05 DIAGNOSIS — B9681 Helicobacter pylori [H. pylori] as the cause of diseases classified elsewhere: Secondary | ICD-10-CM | POA: Diagnosis not present

## 2016-06-05 DIAGNOSIS — I10 Essential (primary) hypertension: Secondary | ICD-10-CM | POA: Diagnosis not present

## 2016-06-05 DIAGNOSIS — R1110 Vomiting, unspecified: Secondary | ICD-10-CM | POA: Diagnosis not present

## 2016-06-05 DIAGNOSIS — R634 Abnormal weight loss: Secondary | ICD-10-CM

## 2016-06-05 DIAGNOSIS — R112 Nausea with vomiting, unspecified: Secondary | ICD-10-CM

## 2016-06-05 DIAGNOSIS — R140 Abdominal distension (gaseous): Secondary | ICD-10-CM

## 2016-06-05 DIAGNOSIS — Z5181 Encounter for therapeutic drug level monitoring: Secondary | ICD-10-CM | POA: Diagnosis not present

## 2016-06-05 DIAGNOSIS — A048 Other specified bacterial intestinal infections: Secondary | ICD-10-CM

## 2016-06-05 DIAGNOSIS — R1084 Generalized abdominal pain: Secondary | ICD-10-CM | POA: Diagnosis present

## 2016-06-05 DIAGNOSIS — Z7982 Long term (current) use of aspirin: Secondary | ICD-10-CM | POA: Diagnosis not present

## 2016-06-05 DIAGNOSIS — R1115 Cyclical vomiting syndrome unrelated to migraine: Secondary | ICD-10-CM

## 2016-06-05 DIAGNOSIS — R197 Diarrhea, unspecified: Secondary | ICD-10-CM | POA: Diagnosis not present

## 2016-06-05 DIAGNOSIS — Z79899 Other long term (current) drug therapy: Secondary | ICD-10-CM | POA: Insufficient documentation

## 2016-06-05 DIAGNOSIS — Z87891 Personal history of nicotine dependence: Secondary | ICD-10-CM | POA: Diagnosis not present

## 2016-06-05 DIAGNOSIS — G43A0 Cyclical vomiting, not intractable: Secondary | ICD-10-CM

## 2016-06-05 DIAGNOSIS — R6881 Early satiety: Secondary | ICD-10-CM | POA: Diagnosis not present

## 2016-06-05 DIAGNOSIS — R109 Unspecified abdominal pain: Secondary | ICD-10-CM | POA: Diagnosis not present

## 2016-06-05 DIAGNOSIS — D72820 Lymphocytosis (symptomatic): Secondary | ICD-10-CM | POA: Diagnosis not present

## 2016-06-05 DIAGNOSIS — K2970 Gastritis, unspecified, without bleeding: Secondary | ICD-10-CM | POA: Diagnosis not present

## 2016-06-05 LAB — URINALYSIS, ROUTINE W REFLEX MICROSCOPIC
BILIRUBIN URINE: NEGATIVE
Glucose, UA: NEGATIVE mg/dL
HGB URINE DIPSTICK: NEGATIVE
KETONES UR: NEGATIVE mg/dL
Leukocytes, UA: NEGATIVE
NITRITE: NEGATIVE
PROTEIN: NEGATIVE mg/dL
Specific Gravity, Urine: 1.008 (ref 1.005–1.030)
pH: 6 (ref 5.0–8.0)

## 2016-06-05 LAB — RAPID URINE DRUG SCREEN, HOSP PERFORMED
Amphetamines: NOT DETECTED
Barbiturates: NOT DETECTED
Benzodiazepines: NOT DETECTED
COCAINE: NOT DETECTED
OPIATES: NOT DETECTED
TETRAHYDROCANNABINOL: NOT DETECTED

## 2016-06-05 LAB — COMPREHENSIVE METABOLIC PANEL
ALT: 23 U/L (ref 17–63)
AST: 24 U/L (ref 15–41)
Albumin: 3.8 g/dL (ref 3.5–5.0)
Alkaline Phosphatase: 43 U/L (ref 38–126)
Anion gap: 5 (ref 5–15)
BILIRUBIN TOTAL: 0.6 mg/dL (ref 0.3–1.2)
BUN: 11 mg/dL (ref 6–20)
CO2: 29 mmol/L (ref 22–32)
CREATININE: 0.91 mg/dL (ref 0.61–1.24)
Calcium: 9.6 mg/dL (ref 8.9–10.3)
Chloride: 106 mmol/L (ref 101–111)
Glucose, Bld: 95 mg/dL (ref 65–99)
Potassium: 3.3 mmol/L — ABNORMAL LOW (ref 3.5–5.1)
Sodium: 140 mmol/L (ref 135–145)
TOTAL PROTEIN: 6.2 g/dL — AB (ref 6.5–8.1)

## 2016-06-05 LAB — CBC WITH DIFFERENTIAL/PLATELET
BASOS PCT: 0 %
Basophils Absolute: 0 10*3/uL (ref 0.0–0.1)
EOS PCT: 0 %
Eosinophils Absolute: 0 10*3/uL (ref 0.0–0.7)
HEMATOCRIT: 36.8 % — AB (ref 39.0–52.0)
Hemoglobin: 12.4 g/dL — ABNORMAL LOW (ref 13.0–17.0)
LYMPHS ABS: 10.2 10*3/uL — AB (ref 0.7–4.0)
Lymphocytes Relative: 79 %
MCH: 29.6 pg (ref 26.0–34.0)
MCHC: 33.7 g/dL (ref 30.0–36.0)
MCV: 87.8 fL (ref 78.0–100.0)
MONO ABS: 0.6 10*3/uL (ref 0.1–1.0)
MONOS PCT: 5 %
Neutro Abs: 2 10*3/uL (ref 1.7–7.7)
Neutrophils Relative %: 16 %
Platelets: 165 10*3/uL (ref 150–400)
RBC: 4.19 MIL/uL — AB (ref 4.22–5.81)
RDW: 14.1 % (ref 11.5–15.5)
WBC: 12.8 10*3/uL — AB (ref 4.0–10.5)

## 2016-06-05 LAB — PATHOLOGIST SMEAR REVIEW

## 2016-06-05 MED ORDER — SUCRALFATE 1 GM/10ML PO SUSP: 1.0000 g | Freq: Three times a day (TID) | ORAL | 0 refills | Status: DC

## 2016-06-05 MED ORDER — ONDANSETRON HCL 4 MG/2ML IJ SOLN
4.0000 mg | Freq: Once | INTRAMUSCULAR | Status: AC
  Administered 2016-06-05: 4 mg via INTRAVENOUS
  Filled 2016-06-05: qty 2

## 2016-06-05 MED ORDER — METOCLOPRAMIDE HCL 5 MG PO TABS: 5.0000 mg | ORAL_TABLET | Freq: Three times a day (TID) | ORAL | 1 refills | Status: DC | PRN

## 2016-06-05 MED ORDER — ONDANSETRON 8 MG PO TBDP: ORAL_TABLET | ORAL | 0 refills | Status: DC

## 2016-06-05 MED ORDER — SODIUM CHLORIDE 0.9 % IV BOLUS (SEPSIS)
1000.0000 mL | Freq: Once | INTRAVENOUS | Status: AC
  Administered 2016-06-05: 1000 mL via INTRAVENOUS

## 2016-06-05 MED ORDER — GI COCKTAIL ~~LOC~~
30.0000 mL | Freq: Once | ORAL | Status: AC
  Administered 2016-06-05: 30 mL via ORAL
  Filled 2016-06-05: qty 30

## 2016-06-05 MED ORDER — ALIGN PO CAPS: 1.0000 | ORAL_CAPSULE | Freq: Every day | ORAL | 0 refills | Status: DC

## 2016-06-05 MED ORDER — DICYCLOMINE HCL 10 MG/ML IM SOLN
20.0000 mg | Freq: Once | INTRAMUSCULAR | Status: AC
  Administered 2016-06-05: 20 mg via INTRAMUSCULAR
  Filled 2016-06-05: qty 2

## 2016-06-05 NOTE — ED Triage Notes (Signed)
Pt complains of abdominal pain for several weeks, he has been seen at [facility] a few times and was told that it was bacteria, he denies diarrhea or vomiting and states that he feels very bloated

## 2016-06-05 NOTE — ED Notes (Signed)
PT in radiology at this time.

## 2016-06-05 NOTE — ED Provider Notes (Signed)
[facility] DEPT Provider Note   CSN: [id number] Arrival date & time: 06/05/16  0122  By signing my name below, I, [name], attest that this documentation has been prepared under the direction and in the presence of [name], MD . Electronically Signed: [name], Scribe. 06/05/2016. 3:35 AM.  History   Chief Complaint Chief Complaint  Patient presents with  . Abdominal Pain   The history is provided by the patient. No language interpreter was used.  Abdominal Pain   This is a recurrent problem. The current episode started more than 1 week ago. The problem occurs constantly. The problem has not changed since onset.The pain is associated with eating (and being on flagyl and doxy for H Pylori). The pain is located in the generalized abdominal region. The pain is at a severity of 10/10. Associated symptoms include diarrhea, vomiting and constipation. Past workup includes CT scan.   HPI Comments: Javier [name] is a 75 y.o. male with PMHx of diverticulitis, HLD and HTN, who presents to the Emergency Department complaining of gradually worsening, 10/10, abdominal pain that began a few weeks ago and worsened today. Per wife, pt complains of bloating and describes it as a "block in his stomach that he can't pass. "Per wife, pt has been experiencing multiple episodes of vomiting and diarrhea; she notes he has been unable to tolerate any foods or fluids. She states pt had persistent vomiting 4 days ago that relieved but returned again this morning. She notes pt was recently seen in the ED on 05/31/16 and diagnosed with H. Pylori and prescribed antibiotics that releived his diarrhea. She notes pt has an appointment with his PCP next week.   PCP: Dr. [name]   Past Medical History:  Diagnosis Date  . Diverticulosis   . Hyperlipidemia   . Hypertension   . Prediabetes   . Vitamin D deficiency     Patient Active Problem List   Diagnosis Date Noted  . Encounter for Medicare annual wellness  exam 09/11/2015  . GERD  02/09/2015  . Medication management 11/02/2013  . Hyperlipidemia   . Hypertension   . Prediabetes   . Vitamin D deficiency   . Diverticulosis     Past Surgical History:  Procedure Laterality Date  . NO PAST SURGERIES      Home Medications    Prior to Admission medications   Medication Sig Start Date End Date Taking? Authorizing Provider  aspirin EC 81 MG tablet Take 81 mg by mouth daily.   Yes Historical Provider, MD  benazepril-hydrochlorthiazide (LOTENSIN HCT) 20-25 MG tablet TAKE 1 TABLET EVERY DAY  FOR  BLOOD  PRESSURE 04/15/16  Yes Unk Pinto, MD  Bismuth Subsalicylate [id number] SUSP Take 15 mLs (525 mg total) by mouth 4 (four) times daily. Patient taking differently: Take 525 mg by mouth 4 (four) times daily as needed (stomach upset).  05/29/16 06/08/16 Yes [name], MD  Cholecalciferol (VITAMIN D3) 5000 units TABS Take 5,000 Units by mouth daily.   Yes Historical Provider, MD  doxycycline (VIBRAMYCIN) 100 MG capsule Take 1 capsule (100 mg total) by mouth 2 (two) times daily. Patient taking differently: Take 100 mg by mouth 2 (two) times daily. 10 day course started 05/29/16 05/29/16 06/08/16 Yes [name], MD  metoCLOPramide (REGLAN) 5 MG tablet Take 1 tablet (5 mg total) by mouth every 8 (eight) hours as needed for nausea or vomiting. 06/02/16  Yes Sherwood Gambler, MD  metroNIDAZOLE (FLAGYL) 250 MG tablet  Take 1 tablet (250 mg total) by mouth 4 (four) times daily. Patient taking differently: Take 250 mg by mouth 4 (four) times daily. 10 day course started 05/29/16 05/29/16 06/08/16 Yes [name], MD  mometasone (NASONEX) 50 MCG/ACT nasal spray Place 2 sprays into the nose daily. Patient taking differently: Place 2 sprays into the nose daily as needed (sinus congestion).  12/17/15 12/16/16 Yes [name], PA-C  omeprazole (PRILOSEC OTC) 20 MG tablet Take 20 mg by mouth daily.   Yes Historical Provider, MD  ondansetron (ZOFRAN  ODT) 4 MG disintegrating tablet Take 1 tablet (4 mg total) by mouth every 6 (six) hours as needed for nausea or vomiting (30). Patient taking differently: Take 4 mg by mouth every 6 (six) hours as needed for nausea or vomiting.  05/31/16  Yes Varney Biles, MD  pantoprazole (PROTONIX) 40 MG tablet Take 1 tablet (40 mg total) by mouth daily. 06/02/16  Yes Sherwood Gambler, MD  pravastatin (PRAVACHOL) 40 MG tablet TAKE 1 TABLET AT BEDTIME  FOR  CHOLESTEROL Patient taking differently: TAKE 1 TABLET BY MOUTH DAILY FOR CHOLESTEROL 08/02/14  Yes Unk Pinto, MD  dicyclomine (BENTYL) 20 MG tablet Take 1 tablet (20 mg total) by mouth 2 (two) times daily. Patient not taking: Reported on 05/23/2016 05/05/16   Margette Fast, MD  promethazine (PHENERGAN) 25 MG tablet Take 1 tablet (25 mg total) by mouth every 6 (six) hours as needed for nausea or vomiting. Patient not taking: Reported on 06/02/2016 05/16/16   Margette Fast, MD    Family History Family History  Problem Relation Age of Onset  . Stroke Mother   . Early death Father     Farm/tractor accident  . Diabetes Brother   . Cirrhosis Brother   . Colon cancer Paternal Grandfather     Social History Social History  Substance Use Topics  . Smoking status: Former Smoker    Types: Cigarettes    Quit date: 01/17/2003  . Smokeless tobacco: Never Used     Comment: Occasional cigars.  . Alcohol use Yes     Comment: very rare     Allergies   Penicillins and Ppd [tuberculin purified protein derivative]   Review of Systems Review of Systems  Constitutional: Positive for appetite change.  Gastrointestinal: Positive for abdominal pain, constipation, diarrhea and vomiting.  All other systems reviewed and are negative.  Physical Exam Updated Vital Signs BP 143/87 (BP Location: Left Arm)   Pulse 64   Temp 97.9 F (36.6 C) (Oral)   Resp 18   Ht 5\' 11"  (1.803 m)   Wt 168 lb (76.2 kg)   SpO2 97%   BMI 23.43 kg/m   Physical Exam  Constitutional:  He appears well-developed and well-nourished.  HENT:  Head: Normocephalic.  Mouth/Throat: Oropharynx is clear and moist. No oropharyngeal exudate.  Eyes: Conjunctivae and EOM are normal. Pupils are equal, round, and reactive to light. Right eye exhibits no discharge. Left eye exhibits no discharge. No scleral icterus.  Neck: Normal range of motion. Neck supple. No JVD present. No tracheal deviation present.  Trachea is midline. No stridor or carotid bruits.  Cardiovascular: Normal rate, regular rhythm, normal heart sounds and intact distal pulses.   No murmur heard. Pulmonary/Chest: Effort normal and breath sounds normal. No stridor. No respiratory distress. He has no wheezes. He has no rales.  Lungs CTA bilaterally.  Abdominal: Soft. Bowel sounds are normal. He exhibits no distension. There is no tenderness. There is no  rebound and no guarding.  Musculoskeletal: Normal range of motion. He exhibits no edema or tenderness.  Lymphadenopathy:    He has no cervical adenopathy.  Neurological: He is alert. He has normal reflexes.  Skin: Skin is warm and dry. Capillary refill takes less than 2 seconds.  Psychiatric: He has a normal mood and affect. His behavior is normal.  Nursing note and vitals reviewed.  ED Treatments / Results   Vitals:   06/05/16 0424 06/05/16 0539  BP: 146/86 126/93  Pulse: (!) 58 65  Resp: 18 18  Temp: 97.6 F (36.4 C)    Results for orders placed or performed during the hospital encounter of 06/05/16  CBC with Differential/Platelet  Result Value Ref Range   WBC 12.8 (H) 4.0 - 10.5 K/uL   RBC 4.19 (L) 4.22 - 5.81 MIL/uL   Hemoglobin 12.4 (L) 13.0 - 17.0 g/dL   HCT 36.8 (L) 39.0 - 52.0 %   MCV 87.8 78.0 - 100.0 fL   MCH 29.6 26.0 - 34.0 pg   MCHC 33.7 30.0 - 36.0 g/dL   RDW 14.1 11.5 - 15.5 %   Platelets 165 150 - 400 K/uL   Neutrophils Relative % 16 %   Lymphocytes Relative 79 %   Monocytes Relative 5 %   Eosinophils Relative 0 %   Basophils Relative 0 %    Neutro Abs 2.0 1.7 - 7.7 K/uL   Lymphs Abs 10.2 (H) 0.7 - 4.0 K/uL   Monocytes Absolute 0.6 0.1 - 1.0 K/uL   Eosinophils Absolute 0.0 0.0 - 0.7 K/uL   Basophils Absolute 0.0 0.0 - 0.1 K/uL   WBC Morphology ATYPICAL LYMPHOCYTES   Comprehensive metabolic panel  Result Value Ref Range   Sodium 140 135 - 145 mmol/L   Potassium 3.3 (L) 3.5 - 5.1 mmol/L   Chloride 106 101 - 111 mmol/L   CO2 29 22 - 32 mmol/L   Glucose, Bld 95 65 - 99 mg/dL   BUN 11 6 - 20 mg/dL   Creatinine, Ser 0.91 0.61 - 1.24 mg/dL   Calcium 9.6 8.9 - 10.3 mg/dL   Total Protein 6.2 (L) 6.5 - 8.1 g/dL   Albumin 3.8 3.5 - 5.0 g/dL   AST 24 15 - 41 U/L   ALT 23 17 - 63 U/L   Alkaline Phosphatase 43 38 - 126 U/L   Total Bilirubin 0.6 0.3 - 1.2 mg/dL   GFR calc non Af Amer >60 >60 mL/min   GFR calc Af Amer >60 >60 mL/min   Anion gap 5 5 - 15  Urinalysis, Routine w reflex microscopic (not at [facility])  Result Value Ref Range   Color, Urine YELLOW YELLOW   APPearance CLEAR CLEAR   Specific Gravity, Urine 1.008 1.005 - 1.030   pH 6.0 5.0 - 8.0   Glucose, UA NEGATIVE NEGATIVE mg/dL   Hgb urine dipstick NEGATIVE NEGATIVE   Bilirubin Urine NEGATIVE NEGATIVE   Ketones, ur NEGATIVE NEGATIVE mg/dL   Protein, ur NEGATIVE NEGATIVE mg/dL   Nitrite NEGATIVE NEGATIVE   Leukocytes, UA NEGATIVE NEGATIVE  Rapid urine drug screen (hospital performed)  Result Value Ref Range   Opiates NONE DETECTED NONE DETECTED   Cocaine NONE DETECTED NONE DETECTED   Benzodiazepines NONE DETECTED NONE DETECTED   Amphetamines NONE DETECTED NONE DETECTED   Tetrahydrocannabinol NONE DETECTED NONE DETECTED   Barbiturates NONE DETECTED NONE DETECTED   Ct Abdomen Pelvis W Contrast  Result Date: 05/25/2016 CLINICAL DATA:  Lower abdominal pain with GI  bleeding. History diverticulosis EXAM: CT ABDOMEN AND PELVIS WITH CONTRAST TECHNIQUE: Multidetector CT imaging of the abdomen and pelvis was performed using the standard protocol following bolus  administration of intravenous contrast. CONTRAST:  179mL ISOVUE-300 IOPAMIDOL (ISOVUE-300) INJECTION 61% COMPARISON:  CT abdomen pelvis 04/15/2016 FINDINGS: Lower chest:  Lung bases clear Hepatobiliary: Liver normal in size and contour. No focal liver lesion. Gallbladder and bile ducts normal Pancreas: Negative Spleen: Negative Adrenals/Urinary Tract: Symmetric excretion of contrast by both kidneys. No renal mass or obstruction. No renal calculi. Urinary bladder normal. Stomach/Bowel: Stomach and duodenum normal. Negative for bowel obstruction. No bowel wall edema. Scattered diverticula in the colon without diverticulitis. No colonic mass or edema. Moderate stool throughout the colon. Normal appendix. Vascular/Lymphatic: Mild atherosclerotic calcification in the aorta and iliac arteries without aneurysm. Negative for lymphadenopathy. Reproductive: No significant bladder wall thickening. Other: No free fluid.  Negative for hernia. Musculoskeletal: Mild lumbar disc and facet degeneration. No acute skeletal abnormality. IMPRESSION: Constipation. No acute abnormality. Negative for diverticulitis. Normal appendix. Electronically Signed   By: Franchot Gallo M.D.   On: 05/25/2016 08:22   Dg Abd Acute W/chest  Result Date: 06/05/2016 CLINICAL DATA:  Subacute onset of generalized abdominal pain and bloating. Vomiting and diarrhea. Initial encounter. EXAM: DG ABDOMEN ACUTE W/ 1V CHEST COMPARISON:  CT of the abdomen and pelvis performed 05/25/2016, and chest radiograph performed 07/30/2007 FINDINGS: The lungs are well-aerated and clear. There is no evidence of focal opacification, pleural effusion or pneumothorax. The cardiomediastinal silhouette is within normal limits. The visualized bowel gas pattern is unremarkable. Scattered stool and air are seen within the colon; there is no evidence of small bowel dilatation to suggest obstruction. No free intra-abdominal air is identified on the provided decubitus view. No acute  osseous abnormalities are seen; the sacroiliac joints are unremarkable in appearance. IMPRESSION: 1. Unremarkable bowel gas pattern; no free intra-abdominal air seen. Small to moderate amount of stool noted in the colon. 2. No acute cardiopulmonary process seen. Electronically Signed   By: Garald Balding M.D.   On: 06/05/2016 04:32     Procedures Procedures  DIAGNOSTIC STUDIES:  Oxygen Saturation is 98% on RA, normal by my interpretation.    COORDINATION OF CARE:  3:33 AM Discussed treatment plan with pt at bedside and pt agreed to plan.  Medications Ordered in ED Medications  ondansetron (ZOFRAN) injection 4 mg (4 mg Intravenous Given 06/05/16 0401)  sodium chloride 0.9 % bolus 1,000 mL (0 mLs Intravenous Stopped 06/05/16 0459)  dicyclomine (BENTYL) injection 20 mg (20 mg Intramuscular Given 06/05/16 0403)  gi cocktail (Maalox,Lidocaine,Donnatal) (30 mLs Oral Given 06/05/16 0458)     Initial Impression / Assessment and Plan / ED Course  I have reviewed the triage vital signs and the nursing notes.  Pertinent labs & imaging results that were available during my care of the patient were reviewed by me and considered in my medical decision making (see chart for details).   Patient has been sleeping during the entirety of the visit but rouses easily to verbal stimuli.  EDP expressed empathy with the patient's wife that they have not been free of the nausea or emesis.  Patient's wife expressed frustration that she has only been able to talk to Dr. Corena Pilgrim nurse who told her to continue with the medications.    46 EDP discussed case via phone with Dr. Fuller Plan on call for Dr. [name].  He recommended temporarily suspending the antibiotic regimen and calling the office today and speaking with  Dr. [name] as patient may need to be seen directly and may benefit from a change in antibiotics for H Pylori  EDP had a lengthy discussion with Patient and wife regarding my conversation with Dr. Fuller Plan.  EDP  apologized that they have had to come to the ED multiple times related to nausea, vomiting and bloating.  EDP states she knows it is frustrating that they see a different doctor each time and that they get medication which they do not feel is helping.  EDP stated that it is imperative that they speak directly with Dr. [name] and communicate the number of ED for ongoing symptoms and discuss an alternative regimen with him. Patient is only taking antiemetics when the nausea is severe.  EDP recommended taking 8 mg Zofran every 8 hours scheduled for the next 2 days and wait 15 minutes followed with a bland diet. Given detailed instructions what counted as bland. Patient has been attempting salmon.  No fish nor eggs.  No butter no spices. Will also add liquid carafate to coat stomach as patient may have a pill esophagitis/gastritis from the doxycycline.  Have also recommended probiotics.    > 25 minutes spent in consultation with patient and wife following labs and XR and consult with GI via phone  I personally performed the services described in this documentation, which was scribed in my presence. The recorded information has been reviewed and is accurate.   Clinical Course     Per nurses report patient actually has an appointment with GI today, neither wife nor patient communicated this to me Final Clinical Impressions(s) / ED Diagnoses   Final diagnoses:  None   All questions answered to patient's satisfaction. Based on history and exam patient has been appropriately medically screened and emergency conditions excluded. Patient is stable for discharge at this time. Follow up with your PMD for recheck in 2 days and strict return precautions given.  New Prescriptions   No medications on file     Orby Tangen, MD 06/05/16 [phone]

## 2016-06-05 NOTE — Progress Notes (Signed)
[facility] Progress Note  Chief Complaint: Bloating and early satiety and vomiting  Subjective  History:  Zaiven is back to see me after his recent upper endoscopy and 3 more visits to the ED just this week. He continues to have upper abdominal bloating and pressure and again says "I just need some release". His wife is extremely frustrated, most notably because he keeps her up all night pacing the floor because of his upper abdominal symptoms. He did not have vomiting until I started him on antibiotics for his H pylori. It should be noted he had only minimal gastritis on the endoscopy, and it is not clear if the H. pylori was really the cause of his symptoms. I felt we should try to treat it since he had no other visible explanation for the persistent early satiety and bloating. His wife again notes he has lost 14 pounds in 5 weeks, however it should be noted that this was primarily at the outset of this illness. We have a documented 2 pound weight loss since his visit with me over 2 weeks ago. He came to the ED in the early hours of this morning for vomiting. His labwork was again normal, he received some IV fluids and antiemetics and was discharged home. ROS: Cardiovascular:  no chest pain Respiratory: no dyspnea He denies headaches dizziness, blurred vision, or focal limb weakness or numbness. The patient's Past Medical, Family and Social History were reviewed and are on file in the EMR.  Objective:  Med list reviewed  Vital signs in last 24 hrs: Vitals:   06/05/16 1056  BP: 134/86  Pulse: 64  down 2 # since last OV 2 weeks ago  (170 - 168#)  Physical Exam HEENT looks fatigue but nontoxic.  HEENT: sclera anicteric, oral mucosa moist without lesions  Neck: supple, no thyromegaly, JVD or lymphadenopathy  Cardiac: RRR without murmurs, S1S2 heard, no peripheral edema  Pulm: clear to auscultation bilaterally, normal RR and effort noted  Abdomen: Thin, soft, no tenderness, with  active bowel sounds. No guarding or palpable hepatosplenomegaly.  Skin; warm and dry, no jaundice or rash Neuro: Extraocular muscles intact, normal gross motor function and hands arms feet and legs.  Recent Labs:   CBC    Component Value Date/Time   WBC 12.8 (H) 06/05/2016 0254   RBC 4.19 (L) 06/05/2016 0254   HGB 12.4 (L) 06/05/2016 0254   HCT 36.8 (L) 06/05/2016 0254   PLT 165 06/05/2016 0254   MCV 87.8 06/05/2016 0254   MCH 29.6 06/05/2016 0254   MCHC 33.7 06/05/2016 0254   RDW 14.1 06/05/2016 0254   LYMPHSABS 10.2 (H) 06/05/2016 0254   MONOABS 0.6 06/05/2016 0254   EOSABS 0.0 06/05/2016 0254   BASOSABS 0.0 06/05/2016 0254   CMP Latest Ref Rng & Units 06/05/2016 06/02/2016 05/31/2016  Glucose 65 - 99 mg/dL 95 90 90  BUN 6 - 20 mg/dL 11 8 6   Creatinine 0.61 - 1.24 mg/dL 0.91 1.14 1.03  Sodium 135 - 145 mmol/L 140 140 140  Potassium 3.5 - 5.1 mmol/L 3.3(L) 3.8 4.1  Chloride 101 - 111 mmol/L 106 106 106  CO2 22 - 32 mmol/L 29 28 27   Calcium 8.9 - 10.3 mg/dL 9.6 10.1 9.9  Total Protein 6.5 - 8.1 g/dL 6.2(L) 6.6 6.5  Total Bilirubin 0.3 - 1.2 mg/dL 0.6 0.9 0.7  Alkaline Phos 38 - 126 U/L 43 51 47  AST 15 - 41 U/L 24 41 23  ALT 17 -  63 U/L 23 25 15(L)    Radiologic studies:  CTAP  X 2   neg  @ASSESSMENTPLANBEGIN @ Assessment: Encounter Diagnoses  Name Primary?  . Abdominal bloating Yes  . Early satiety   . Abnormal loss of weight   . Non-intractable cyclical vomiting with nausea   . H. pylori infection    Assuming the H. pylori is not the cause of this, which it really does not appear to be in my opinion, then there is so far no visible cause for these symptoms. He has had an extensive radiologic and endoscopic workup. I still believe that all sounds like it started a some kind of acute infectious illness such as a viral gastroenteritis. Therefore, I think he may just have a postinfectious syndrome. Nevertheless, he and his wife are extremely frustrated, and the longer  goes on, the more anxiety provoking it has been.  Plan:  Stop the H. pylori therapy for now Stop sucralfate, dicyclomine, and the probiotics prescribed in the ED.  A trial of Reglan 5 mg 3 times a day for the next 5-7 days. If he is improved, we will continue it for a total of about 2 weeks and then stop and reassess symptoms. He and his wife are aware of the possible neurological risk, and I explained what tardive dyskinesia might look like. I think that risk is low in the short-term. If he does not improve with that, I will refer Zaeden to an academic medical institution at the request of him and his wife.  Total time 30 minutes, over half spent in counseling and coordination of care.   [name]

## 2016-06-05 NOTE — ED Notes (Signed)
Awaiting evaluation by provider.

## 2016-06-05 NOTE — Patient Instructions (Addendum)
Do not take carafate or bifidobacterium  Stop taking dicyclomine.  Stop taking promethazine.  Stop taking bismuth subsalicylate, doxycycline and metronidazole.  Start the metoclopramide as prescribed by the emergency department today. I changed the prescription for a larger supply of it.  The ondansetron can also be taken as needed for nausea.  Call us early next week with an update on your condition. Patty RN [phone]  Thank you for choosing [facility]  Dr Wilfrid Lund III

## 2016-06-09 ENCOUNTER — Ambulatory Visit (INDEPENDENT_AMBULATORY_CARE_PROVIDER_SITE_OTHER): Payer: Commercial Managed Care - HMO | Admitting: Internal Medicine

## 2016-06-09 ENCOUNTER — Encounter: Payer: Self-pay | Admitting: Internal Medicine

## 2016-06-09 VITALS — BP 146/86 | HR 72 | Temp 97.3°F | Ht 70.5 in | Wt 163.4 lb

## 2016-06-09 DIAGNOSIS — Z23 Encounter for immunization: Secondary | ICD-10-CM | POA: Diagnosis not present

## 2016-06-09 DIAGNOSIS — K2100 Gastro-esophageal reflux disease with esophagitis, without bleeding: Secondary | ICD-10-CM

## 2016-06-09 DIAGNOSIS — R634 Abnormal weight loss: Secondary | ICD-10-CM

## 2016-06-09 DIAGNOSIS — R140 Abdominal distension (gaseous): Secondary | ICD-10-CM | POA: Diagnosis not present

## 2016-06-09 NOTE — Addendum Note (Signed)
Addended by: Melbourne Abts C on: 06/09/2016 04:16 PM   Modules accepted: Orders

## 2016-06-09 NOTE — Patient Instructions (Addendum)
Stop BANANAS   -  Cruciferous vegetables to avoid include  broccoli, brussels, asparagus, cabbage, collards, cauliflower, kale  - Recc use "Salt Substitute" liberally on foods for potassium replenishment as his hypokalemia could contribute to gastric stasis.  - Given a case of Ensure Enclive to sample

## 2016-06-09 NOTE — Progress Notes (Signed)
[facility]   Unk Pinto, M.D.    Uvaldo Bristle. Silverio Lay, P.A.-C      Starlyn Skeans, P.A.-C   [facility]                [address] [place] [id number] Telephone [phone] Telefax [phone] Subjective:    Patient ID: Javier [name], male    DOB: 11-Aug-1941, 75 y.o.   MRN: [id number]  HPI This very nice 75 yo MBM with ongoing upper GI c/o for 2-3 months and a 25# weight loss over the last 3 months since his June 23 visit.  Patient has had 3-4 trips to the ER for evaluation of his sx's and Labs were essentally normal and on occasion was given IVF. Patient has had a negative Abd U/S, 2 neg Abd CT scans and a neg EGD and is followed by Dr [name] whom he recently saw 06/05/16 and had meds changed to d/c Dicyclomine and start empiric trial with Metoclopramide. Patient reports ongoing sx' of Abdominal bloating which awakens him from sleep and he gets up & "walks the floor". He describes a foamy bitter reflux. He also reports eating bananas daily! Patient and wife both endorse his anxiety re: eating.   Medication Sig  . aspirin EC 81 MG  Take daily.  . benazepril-hctz 20-25  TAKE 1 TAB EVERY DAY    . VITAMIN D 5000 units  Take 5,000 Units  daily.  . metoCLOPramide  5 MG Take 1 tab every 8  hrs  . Ondansetron ODT 8 MG  Take every 8 hrs  PRN  . Pantoprazole 40 MG tablet Take 1 tab daily.  . pravastatin  40 MG tablet TAKE 1 TABLET AT BEDTIME   . sucralfate  1 GM tab Take 1 g  4  times daily - meals and at bedtime.   Allergies  Allergen Reactions  . Penicillins Other (See Comments)  . Ppd [Tuberculin Purified Protein Derivative]     Positive test in 2004   Past Medical History:  Diagnosis Date  . Diverticulosis   . Hyperlipidemia   . Hypertension   . Prediabetes   . Vitamin D deficiency     . NO PAST SURGERIES     Review of Systems  10 point systems review negative except as above.     Objective:   Physical Exam  BP 146/86   P 72   T 97.3 F  Ht 5' 10.5"    Wt 163 lb   BMI 23.11   HEENT - WNL Neck - supple. Nl Thyroid. Carotids 2+ & No bruits, nodes, JVD Chest - Clear. Cor -  RRR w/o sig M. No edema. Abd - Soft w/o palpable organomegaly, masses or tenderness. BS nl. MS- FROM. Muscle power, tone and bulk Nl. Gait Nl. Neuro - No obvious Cr N abnormalities.Nl w/o focal abnormalities.    Assessment & Plan:   1. Gastroesophageal reflux disease with esophagitis      (? Alkaline Bile Reflux)   - Advised stop Bananas (!!!)  and given list of Cruciferous vegetables to avoid as broccoli, brussels, asparagus, cabbage, collards, cauliflower, kale  - Recc use "Salt Substitute" liberally on foods for potassium replenishment as his hypokalemia could contribute to gastric stasis.  - Consider HIDA scan  2. Postprandial abdominal bloating   3. Loss of weight  - Recc Rx  Alprazolam 0.5 mg #90  X 1 rf and recc take 1/2 tab TIDac and 1 tablet qHS  - ROV 2 weeks for reassessment

## 2016-06-11 ENCOUNTER — Ambulatory Visit: Payer: Self-pay | Admitting: Internal Medicine

## 2016-06-16 ENCOUNTER — Ambulatory Visit (HOSPITAL_COMMUNITY)
Admission: RE | Admit: 2016-06-16 | Discharge: 2016-06-16 | Disposition: A | Discharge status: Home / Self Care | Payer: Commercial Managed Care - HMO | Source: Ambulatory Visit | Attending: Physician Assistant | Admitting: Physician Assistant

## 2016-06-16 ENCOUNTER — Encounter: Payer: Self-pay | Admitting: Physician Assistant

## 2016-06-16 ENCOUNTER — Ambulatory Visit (INDEPENDENT_AMBULATORY_CARE_PROVIDER_SITE_OTHER): Payer: Commercial Managed Care - HMO | Admitting: Physician Assistant

## 2016-06-16 VITALS — BP 150/70 | HR 73 | Temp 97.5°F | Resp 14 | Ht 70.5 in | Wt 166.0 lb

## 2016-06-16 DIAGNOSIS — R112 Nausea with vomiting, unspecified: Secondary | ICD-10-CM | POA: Insufficient documentation

## 2016-06-16 DIAGNOSIS — K2100 Gastro-esophageal reflux disease with esophagitis, without bleeding: Secondary | ICD-10-CM

## 2016-06-16 DIAGNOSIS — R140 Abdominal distension (gaseous): Secondary | ICD-10-CM

## 2016-06-16 DIAGNOSIS — R634 Abnormal weight loss: Secondary | ICD-10-CM | POA: Diagnosis not present

## 2016-06-16 DIAGNOSIS — K5900 Constipation, unspecified: Secondary | ICD-10-CM | POA: Diagnosis not present

## 2016-06-16 LAB — CBC WITH DIFFERENTIAL/PLATELET
BASOS PCT: 0 %
Basophils Absolute: 0 cells/uL (ref 0–200)
EOS PCT: 0 %
Eosinophils Absolute: 0 cells/uL — ABNORMAL LOW (ref 15–500)
HEMATOCRIT: 39.9 % (ref 38.5–50.0)
Hemoglobin: 13 g/dL — ABNORMAL LOW (ref 13.2–17.1)
LYMPHS PCT: 77 %
Lymphs Abs: 11473 cells/uL — ABNORMAL HIGH (ref 850–3900)
MCH: 28.9 pg (ref 27.0–33.0)
MCHC: 32.6 g/dL (ref 32.0–36.0)
MCV: 88.7 fL (ref 80.0–100.0)
MONO ABS: 596 {cells}/uL (ref 200–950)
MPV: 11.8 fL (ref 7.5–12.5)
Monocytes Relative: 4 %
NEUTROS PCT: 19 %
Neutro Abs: 2831 cells/uL (ref 1500–7800)
PLATELETS: 176 10*3/uL (ref 140–400)
RBC: 4.5 MIL/uL (ref 4.20–5.80)
RDW: 14.7 % (ref 11.0–15.0)
WBC: 14.9 10*3/uL — AB (ref 3.8–10.8)

## 2016-06-16 LAB — HEPATIC FUNCTION PANEL
ALK PHOS: 44 U/L (ref 40–115)
ALT: 12 U/L (ref 9–46)
AST: 16 U/L (ref 10–35)
Albumin: 4.1 g/dL (ref 3.6–5.1)
BILIRUBIN DIRECT: 0.1 mg/dL (ref <=0.2)
BILIRUBIN INDIRECT: 0.3 mg/dL (ref 0.2–1.2)
BILIRUBIN TOTAL: 0.4 mg/dL (ref 0.2–1.2)
TOTAL PROTEIN: 6.4 g/dL (ref 6.1–8.1)

## 2016-06-16 LAB — BASIC METABOLIC PANEL WITH GFR
BUN: 14 mg/dL (ref 7–25)
CALCIUM: 9.8 mg/dL (ref 8.6–10.3)
CO2: 28 mmol/L (ref 20–31)
Chloride: 105 mmol/L (ref 98–110)
Creat: 0.93 mg/dL (ref 0.70–1.18)
GFR, EST NON AFRICAN AMERICAN: 80 mL/min (ref >=60)
Glucose, Bld: 110 mg/dL — ABNORMAL HIGH (ref 65–99)
POTASSIUM: 3.8 mmol/L (ref 3.5–5.3)
SODIUM: 141 mmol/L (ref 135–146)

## 2016-06-16 MED ORDER — PRAVASTATIN SODIUM 40 MG PO TABS: ORAL_TABLET | ORAL | 3 refills | Status: DC

## 2016-06-16 MED ORDER — METOCLOPRAMIDE HCL 5 MG PO TABS: 5.0000 mg | ORAL_TABLET | Freq: Three times a day (TID) | ORAL | 1 refills | Status: DC | PRN

## 2016-06-16 NOTE — Progress Notes (Signed)
Subjective:    Patient ID: Javier [name], male    DOB: December 23, 1940, 75 y.o.   MRN: [id number]  HPI  75 y.o. M with history of HTN, chol presents with nausea and vomiting. He has been seeing GI, Dr. [name] for irritractable N/V with epigastric pain/gas/fullness at night. He has had a normal AB [place], AB CT, EGD showed mild gastritis with + Hpylori, he was unable to tolerate treatment, currently all meds were stopped other than PPI and reglan 5mg .   He feels that he is constipated, no BM x 3 days, has passed gas but very rare, states xanax is helping him sleep, he is still on carafate, has not been taking reglan x 2 days. He feels very full, gets full very quickly with first bite of food, will have episodes of sweating with burping, and then he will start vomiting undigested food or bile. Mainly happens at night, not during the day as much. He vomited up grits and eggs, sausage then he started to vomit an hour after that, he did have lunch a sandwich.   Blood pressure (!) 150/70, pulse 73, temperature 97.5 F (36.4 C), resp. rate 14, height 5' 10.5" (1.791 m), weight 166 lb (75.3 kg), SpO2 97 %.  Medications Current Outpatient Prescriptions on File Prior to Visit  Medication Sig  . aspirin EC 81 MG tablet Take 81 mg by mouth daily.  . benazepril-hydrochlorthiazide (LOTENSIN HCT) 20-25 MG tablet TAKE 1 TABLET EVERY DAY  FOR  BLOOD  PRESSURE  . bifidobacterium infantis (ALIGN) capsule Take 1 capsule by mouth daily.  . Cholecalciferol (VITAMIN D3) 5000 units TABS Take 5,000 Units by mouth daily.  . metoCLOPramide (REGLAN) 5 MG tablet Take 1 tablet (5 mg total) by mouth every 8 (eight) hours as needed for nausea or vomiting.  . pantoprazole (PROTONIX) 40 MG tablet Take 1 tablet (40 mg total) by mouth daily.  . pravastatin (PRAVACHOL) 40 MG tablet TAKE 1 TABLET AT BEDTIME  FOR  CHOLESTEROL (Patient taking differently: TAKE 1 TABLET BY MOUTH DAILY FOR CHOLESTEROL)  . sucralfate (CARAFATE) 1 g tablet  Take 1 g by mouth 4 (four) times daily.   No current facility-administered medications on file prior to visit.     Problem list He has Hyperlipidemia; Hypertension; Prediabetes; Vitamin D deficiency; Diverticulosis; Medication management; GERD ; and Encounter for Medicare annual wellness exam on his problem list.  Review of Systems  Constitutional: Positive for appetite change, diaphoresis and unexpected weight change. Negative for fatigue and fever.  Respiratory: Negative for chest tightness and shortness of breath.   Cardiovascular: Negative for chest pain and palpitations.  Gastrointestinal: Positive for abdominal pain, constipation, nausea and vomiting. Negative for anal bleeding, blood in stool, diarrhea and rectal pain.  Genitourinary: Negative.   Neurological: Negative for dizziness, light-headedness and headaches.       Objective:   Physical Exam  Constitutional: He is oriented to person, place, and time. He appears well-developed and well-nourished. No distress.  HENT:  Head: Normocephalic.  Mouth/Throat: Oropharynx is clear and moist. No oropharyngeal exudate.  Eyes: Conjunctivae are normal. No scleral icterus.  Neck: Normal range of motion. Neck supple. No JVD present. No thyromegaly present.  Cardiovascular: Normal rate, regular rhythm, normal heart sounds and intact distal pulses.  Exam reveals no gallop and no friction rub.   No murmur heard. Pulmonary/Chest: Effort normal and breath sounds normal. No respiratory distress. He has no wheezes. He has no rales. He exhibits no tenderness.  Abdominal:  Soft. Normal appearance. He exhibits no distension and no mass. Bowel sounds are increased. There is tenderness (bilateral lower quadrant) in the right lower quadrant and left lower quadrant. There is no rigidity, no rebound, no guarding, no CVA tenderness, no tenderness at McBurney's point and negative Murphy's sign.  Musculoskeletal: Normal range of motion.  Lymphadenopathy:     He has no cervical adenopathy.  Neurological: He is alert and oriented to person, place, and time.  Skin: Skin is warm and dry. He is not diaphoretic.  Psychiatric: He has a normal mood and affect. His behavior is normal. Judgment and thought content normal.  Nursing note and vitals reviewed.     Assessment & Plan:   1. Irrtractable nausea and vomiting - acute rule out bowel obstruction, get xray, will get stat labs.  -avoid gas producing foods/fatty foods - will get HIDA scan to rule out gallbladder etiology, [place] and CT normal, ? Need for gastric emptying study or ? If intermittent ischemic bowel and need CTA? - STOP carafate, start reglan 5mg  TID with food -follow up Gastroenterology - go to ER if worsening AB pain, N/V, SOB, CP.   [phone] [id number] house number [phone] [id number] cell

## 2016-06-16 NOTE — Patient Instructions (Addendum)
Stop the carafate  Start the reglan 5mg  back 20-30 mins before food  Low-Fat Diet for Pancreatitis or Gallbladder Conditions A low-fat diet can be helpful if you have pancreatitis or a gallbladder condition. With these conditions, your pancreas and gallbladder have trouble digesting fats. A healthy eating plan with less fat will help rest your pancreas and gallbladder and reduce your symptoms. WHAT DO I NEED TO KNOW ABOUT THIS DIET?  Eat a low-fat diet.  Reduce your fat intake to less than 20-30% of your total daily calories. This is less than 50-60 g of fat per day.  Remember that you need some fat in your diet. Ask your dietician what your daily goal should be.  Choose nonfat and low-fat healthy foods. Look for the words "nonfat," "low fat," or "fat free."  As a guide, look on the label and choose foods with less than 3 g of fat per serving. Eat only one serving.  Avoid alcohol.  Do not smoke. If you need help quitting, talk with your health care provider.  Eat small frequent meals instead of three large heavy meals. WHAT FOODS CAN I EAT? Grains Include healthy grains and starches such as potatoes, wheat bread, fiber-rich cereal, and brown rice. Choose whole grain options whenever possible. In adults, whole grains should account for 45-65% of your daily calories.  Fruits and Vegetables Eat plenty of fruits and vegetables. Fresh fruits and vegetables add fiber to your diet. Meats and Other Protein Sources Eat lean meat such as chicken and pork. Trim any fat off of meat before cooking it. Eggs, fish, and beans are other sources of protein. In adults, these foods should account for 10-35% of your daily calories. Dairy Choose low-fat milk and dairy options. Dairy includes fat and protein, as well as calcium.  Fats and Oils Limit high-fat foods such as fried foods, sweets, baked goods, sugary drinks.  Other Creamy sauces and condiments, such as mayonnaise, can add extra fat. Think  about whether or not you need to use them, or use smaller amounts or low fat options. WHAT FOODS ARE NOT RECOMMENDED?  High fat foods, such as:  [employer].  Ice cream.  [place] toast.  Sweet rolls.  Pizza.  Cheese bread.  Foods covered with batter, butter, creamy sauces, or cheese.  Fried foods.  Sugary drinks and desserts.  Foods that cause gas or bloating   This information is not intended to replace advice given to you by your health care provider. Make sure you discuss any questions you have with your health care provider.   Document Released: 09/20/2013 Document Reviewed: 09/20/2013 Elsevier Interactive Patient Education 2016 [place] Bowel Obstruction A small bowel obstruction is a blockage in the small bowel. The small bowel, which is also called the small intestine, is a long, slender tube that connects the stomach to the colon. When a person eats and drinks, food and fluids go from the stomach to the small bowel. This is where most of the nutrients in the food and fluids are absorbed. A small bowel obstruction will prevent food and fluids from passing through the small bowel as they normally do during digestion. The small bowel can become partially or completely blocked. This can cause symptoms such as abdominal pain, vomiting, and bloating. If this condition is not treated, it can be dangerous because the small bowel could rupture. CAUSES Common causes of this condition include:  Scar tissue from previous surgery or radiation treatment.  Recent surgery.  This may cause the movements of the bowel to slow down and cause food to block the intestine.  Hernias.  Inflammatory bowel disease (colitis).  Twisting of the bowel (volvulus).  Tumors.  A foreign body.  Slipping of a part of the bowel into another part (intussusception). SYMPTOMS Symptoms of this condition include:  Abdominal pain. This may be dull cramps or sharp pain. It may occur in  one area, or it may be present in the entire abdomen. Pain can range from mild to severe, depending on the degree of obstruction.  Nausea and vomiting. Vomit may be greenish or a yellow bile color.  Abdominal bloating.  Constipation.  Lack of passing gas.  Frequent belching.  Diarrhea. This may occur if the obstruction is partial and runny stool is able to leak around the obstruction. DIAGNOSIS This condition may be diagnosed based on a physical exam, medical history, and X-rays of the abdomen. You may also have other tests, such as a CT scan of the abdomen and pelvis. TREATMENT Treatment for this condition depends on the cause and severity of the problem. Treatment options may include:  Bed rest along with fluids and pain medicines that are given through an IV tube inserted into one of your veins. Sometimes, this is all that is needed for the obstruction to improve.  Following a simple diet. In some cases, a clear liquid diet may be required for several days. This allows the bowel to rest.  Placement of a small tube (nasogastric tube) into the stomach. When the bowel is blocked, it usually swells up like a balloon that is filled with air and fluids. The air and fluids may be removed by suction through the nasogastric tube. This can help with pain, discomfort, and nausea. It can also help the obstruction to clear up faster.  Surgery. This may be required if other treatments do not work. Bowel obstruction from a hernia may require early surgery and can be an emergency procedure. Surgery may also be required for scar tissue that causes frequent or severe obstructions. HOME CARE INSTRUCTIONS  Get plenty of rest.  Follow instructions from your health care provider about eating restrictions. You may need to avoid solid foods and consume only clear liquids until your condition improves.  Take over-the-counter and prescription medicines only as told by your health care provider.  Keep all  follow-up visits as told by your health care provider. This is important. SEEK MEDICAL CARE IF:  You have a fever.  You have chills. SEEK IMMEDIATE MEDICAL CARE IF:  You have increased pain or cramping.  You vomit blood.  You have uncontrolled vomiting or nausea.  You cannot drink fluids because of vomiting or pain.  You develop confusion.  You begin feeling very dry or thirsty (dehydrated).  You have severe bloating.  You feel extremely weak or you faint.   This information is not intended to replace advice given to you by your health care provider. Make sure you discuss any questions you have with your health care provider.   Document Released: 12/02/2005 Document Revised: 06/06/2015 Document Reviewed: 11/09/2014 Elsevier Interactive Patient Education [employer].

## 2016-06-18 ENCOUNTER — Ambulatory Visit: Payer: Self-pay | Admitting: Internal Medicine

## 2016-06-18 ENCOUNTER — Ambulatory Visit: Payer: Commercial Managed Care - HMO | Admitting: Gastroenterology

## 2016-06-19 LAB — LACTIC ACID, PLASMA: LACTIC ACID: 8 mg/dL (ref 4–16)

## 2016-06-23 ENCOUNTER — Ambulatory Visit: Payer: Self-pay | Admitting: Physician Assistant

## 2016-06-25 ENCOUNTER — Ambulatory Visit: Payer: Self-pay | Admitting: Internal Medicine

## 2016-06-26 ENCOUNTER — Encounter (HOSPITAL_COMMUNITY)
Admission: RE | Admit: 2016-06-26 | Discharge: 2016-06-26 | Disposition: A | Discharge status: Home / Self Care | Payer: Commercial Managed Care - HMO | Source: Ambulatory Visit | Attending: Physician Assistant | Admitting: Physician Assistant

## 2016-06-26 DIAGNOSIS — R140 Abdominal distension (gaseous): Secondary | ICD-10-CM | POA: Diagnosis not present

## 2016-06-26 DIAGNOSIS — R112 Nausea with vomiting, unspecified: Secondary | ICD-10-CM | POA: Diagnosis not present

## 2016-06-26 MED ORDER — TECHNETIUM TC 99M MEBROFENIN IV KIT
5.0000 | PACK | Freq: Once | INTRAVENOUS | Status: AC | PRN
  Administered 2016-06-26: 5 via INTRAVENOUS

## 2016-06-27 ENCOUNTER — Other Ambulatory Visit: Payer: Self-pay | Admitting: Physician Assistant

## 2016-06-27 NOTE — Progress Notes (Signed)
Offered patient buspar to take, offered referral to tertiary center.

## 2016-06-30 ENCOUNTER — Telehealth: Payer: Self-pay | Admitting: Physician Assistant

## 2016-06-30 DIAGNOSIS — R112 Nausea with vomiting, unspecified: Secondary | ICD-10-CM

## 2016-06-30 DIAGNOSIS — R6881 Early satiety: Secondary | ICD-10-CM

## 2016-06-30 DIAGNOSIS — R140 Abdominal distension (gaseous): Secondary | ICD-10-CM

## 2016-06-30 DIAGNOSIS — R634 Abnormal weight loss: Secondary | ICD-10-CM

## 2016-06-30 NOTE — Telephone Encounter (Signed)
Called patient, spoke with his [name].  I explained that the HIDA scan was slightly abnormal but after talking with Dr. [name] he does not feel that the HIDA explains the patients symptoms, we all feel that it would be best for Javier [name] to be seen by a tertiary center. The wife and patient agree, we will put in a referral to [facility].   She states he is not on any medications at this time, and there has been a questions of compliance with medial recommendations, he is eating and having BM's at this time but still having the uncomfortable epigastric pain and weight loss.   Please see test/labs performed by Dr. [name] and our office.

## 2016-06-30 NOTE — Telephone Encounter (Signed)
-----   Message from Javier [name], [place] sent at 06/30/2016  3:21 PM EDT ----- Pt aware of lab results & voiced understanding. Pt's wife states her husband is still losing wt. DOES NOT want to start any new meds per husband & wife. Pt's wife states she is just unhappy due to the fact that no one can tell her what is wrong with her husband. I did my best to explain what would be happening next, she seemed to understand.

## 2016-07-07 DIAGNOSIS — R140 Abdominal distension (gaseous): Secondary | ICD-10-CM | POA: Diagnosis not present

## 2016-07-07 DIAGNOSIS — Z87891 Personal history of nicotine dependence: Secondary | ICD-10-CM | POA: Diagnosis not present

## 2016-07-07 DIAGNOSIS — Z79899 Other long term (current) drug therapy: Secondary | ICD-10-CM | POA: Diagnosis not present

## 2016-07-07 DIAGNOSIS — I10 Essential (primary) hypertension: Secondary | ICD-10-CM | POA: Diagnosis not present

## 2016-07-07 DIAGNOSIS — R634 Abnormal weight loss: Secondary | ICD-10-CM | POA: Diagnosis not present

## 2016-07-07 DIAGNOSIS — E785 Hyperlipidemia, unspecified: Secondary | ICD-10-CM | POA: Diagnosis not present

## 2016-07-07 DIAGNOSIS — K5909 Other constipation: Secondary | ICD-10-CM | POA: Diagnosis not present

## 2016-07-16 DIAGNOSIS — R140 Abdominal distension (gaseous): Secondary | ICD-10-CM | POA: Diagnosis not present

## 2016-07-16 DIAGNOSIS — Z8619 Personal history of other infectious and parasitic diseases: Secondary | ICD-10-CM | POA: Diagnosis not present

## 2016-07-16 DIAGNOSIS — R634 Abnormal weight loss: Secondary | ICD-10-CM | POA: Diagnosis not present

## 2016-08-18 ENCOUNTER — Other Ambulatory Visit: Payer: Self-pay | Admitting: Internal Medicine

## 2016-09-15 ENCOUNTER — Other Ambulatory Visit: Payer: Self-pay | Admitting: Internal Medicine

## 2016-09-15 DIAGNOSIS — D508 Other iron deficiency anemias: Secondary | ICD-10-CM | POA: Diagnosis not present

## 2016-09-15 DIAGNOSIS — R634 Abnormal weight loss: Secondary | ICD-10-CM | POA: Diagnosis not present

## 2016-09-15 DIAGNOSIS — D72820 Lymphocytosis (symptomatic): Secondary | ICD-10-CM | POA: Diagnosis not present

## 2016-09-24 ENCOUNTER — Encounter: Payer: Self-pay | Admitting: Internal Medicine

## 2016-09-24 ENCOUNTER — Ambulatory Visit (INDEPENDENT_AMBULATORY_CARE_PROVIDER_SITE_OTHER): Payer: Commercial Managed Care - HMO | Admitting: Internal Medicine

## 2016-09-24 VITALS — BP 142/80 | HR 76 | Temp 97.3°F | Resp 16 | Ht 71.75 in | Wt 164.2 lb

## 2016-09-24 DIAGNOSIS — R6889 Other general symptoms and signs: Secondary | ICD-10-CM | POA: Diagnosis not present

## 2016-09-24 DIAGNOSIS — R7303 Prediabetes: Secondary | ICD-10-CM | POA: Diagnosis not present

## 2016-09-24 DIAGNOSIS — E559 Vitamin D deficiency, unspecified: Secondary | ICD-10-CM

## 2016-09-24 DIAGNOSIS — Z136 Encounter for screening for cardiovascular disorders: Secondary | ICD-10-CM

## 2016-09-24 DIAGNOSIS — K2930 Chronic superficial gastritis without bleeding: Secondary | ICD-10-CM

## 2016-09-24 DIAGNOSIS — Z125 Encounter for screening for malignant neoplasm of prostate: Secondary | ICD-10-CM | POA: Diagnosis not present

## 2016-09-24 DIAGNOSIS — E782 Mixed hyperlipidemia: Secondary | ICD-10-CM | POA: Diagnosis not present

## 2016-09-24 DIAGNOSIS — R634 Abnormal weight loss: Secondary | ICD-10-CM | POA: Diagnosis not present

## 2016-09-24 DIAGNOSIS — Z1212 Encounter for screening for malignant neoplasm of rectum: Secondary | ICD-10-CM

## 2016-09-24 DIAGNOSIS — Z0001 Encounter for general adult medical examination with abnormal findings: Secondary | ICD-10-CM

## 2016-09-24 DIAGNOSIS — I10 Essential (primary) hypertension: Secondary | ICD-10-CM

## 2016-09-24 DIAGNOSIS — Z79899 Other long term (current) drug therapy: Secondary | ICD-10-CM

## 2016-09-24 DIAGNOSIS — K219 Gastro-esophageal reflux disease without esophagitis: Secondary | ICD-10-CM

## 2016-09-24 LAB — TSH: TSH: 0.97 mIU/L (ref 0.40–4.50)

## 2016-09-24 NOTE — Patient Instructions (Signed)

## 2016-09-24 NOTE — Progress Notes (Signed)
[facility]   Unk Pinto, M.D.    Uvaldo Bristle. Silverio Lay, P.A.-C      Starlyn Skeans, P.A.-C  [facility]                [address] [place] [id number] Telephone [phone] Telefax [phone] Annual  Screening/Preventative Visit  & Comprehensive Evaluation & Examination     This very nice 75 y.o. MBM presents for a Screening/Preventative Visit & comprehensive evaluation and management of multiple medical co-morbidities.  Patient has been followed for HTN, Prediabetes, Hyperlipidemia and Vitamin D Deficiency.     Patient has hx/o nausea and vomiting. He has been seeing GI, Dr. [name] for irritractable N/V with epigastric pain/gas/fullness at night. He has had a normal AB [place], AB CT, EGD showed mild gastritis with + H.pylori, he was unable to tolerate treatment, currently all meds were stopped other than PPI and reglan 5mg . Patient has had a 30# weight loss over the last 6 months since his June 23 visit. More recently patient had been referred to Dr Orvis Brill ([facility]) and is undergoing evaluation. Patient apparently had a recent  (+) breath test and patient also reports that over the last several weeks that his abdominal sx's, N/V have been improving as he has been able to advance his diet.      Patient's HTN predates circa 2004. Patient's BP has been controlled at home.  Today's BP is 142/80 - at goal. . Patient denies any cardiac symptoms as chest pain, palpitations, shortness of breath, dizziness or ankle swelling.     Patient's hyperlipidemia is controlled with diet and medications. Patient denies myalgias or other medication SE's. Last lipids were at goal: Lab Results  Component Value Date   CHOL 156 03/21/2016   HDL 49 03/21/2016   LDLCALC 94 03/21/2016   TRIG 64 03/21/2016   CHOLHDL 3.2 03/21/2016      Patient has prediabetes  With  A1c 5.8% since 2010 and patient denies reactive  hypoglycemic symptoms, visual blurring, diabetic polys or paresthesias. Last A1c was almost to goal:  Lab Results  Component Value Date   HGBA1C 5.7 (H) 03/21/2016       Finally, patient has history of Vitamin D Deficiency and last vitamin D was at goal: Lab Results  Component Value Date   VD25OH 79 03/21/2016   Current Outpatient Prescriptions on File Prior to Visit  Medication Sig  . ALPRAZolam (XANAX) 0.5 MG tablet TAKE ONE-HALF TABLET BY MOUTH THREE TIMES DAILY BEFORE MEALS AND  ONE  AT  BEDTIME  . aspirin EC 81 MG tablet Take 81 mg by mouth daily.  . benazepril-hydrochlorthiazide (LOTENSIN HCT) 20-25 MG tablet TAKE 1 TABLET EVERY DAY  FOR  BLOOD  PRESSURE  . Cholecalciferol (VITAMIN D3) 5000 units TABS Take 5,000 Units by mouth daily.  . metoCLOPramide (REGLAN) 5 MG tablet Take 1 tablet (5 mg total) by mouth every 8 (eight) hours as needed for nausea or vomiting.  . pantoprazole (PROTONIX) 40 MG tablet Take 1 tablet (40 mg total) by mouth daily.  . pravastatin (PRAVACHOL) 40 MG tablet TAKE 1 TABLET AT BEDTIME  FOR  CHOLESTEROL   No current facility-administered medications on file prior to visit.    Allergies  Allergen Reactions  . Penicillins   . Ppd [Tuberculin Purified Protein Derivative]     Positive test in 2004  Past Medical History:  Diagnosis Date  . Diverticulosis   . Hyperlipidemia   . Hypertension   . Prediabetes   . Vitamin D deficiency    Health Maintenance  Topic Date Due  . PNA vac Low Risk Adult (2 of 2 - PCV13) 09/20/2010  . COLONOSCOPY  05/22/2017  . TETANUS/TDAP  09/21/2019  . INFLUENZA VACCINE  Completed  . ZOSTAVAX  Completed   Immunization History  Administered Date(s) Administered  . Influenza Split 07/28/2013  . Influenza, High Dose Seasonal PF 09/11/2015, 06/09/2016  . Pneumococcal-Unspecified 07/01/1999, 09/20/2009  . Td 09/20/2009  . Zoster 11/08/2009   Past Surgical History:  Procedure Laterality Date  . NO PAST SURGERIES      Family History  Problem Relation Age of Onset  . Stroke Mother   . Early death Father     Farm/tractor accident  . Diabetes Brother   . Cirrhosis Brother   . Colon cancer Paternal Grandfather    Social History   Social History  . Marital status: Married    Spouse name: N/A  . Number of children: 5  . Years of education: N/A   Occupational History  . retired    Social History Main Topics  . Smoking status: Former Smoker    Types: Cigarettes    Quit date: 01/17/2003  . Smokeless tobacco: Never Used     Comment: Occasional cigars.  . Alcohol use Yes     Comment: very rare  . Drug use: No  . Sexual activity: Not on file    ROS Constitutional: Denies fever, chills, weight loss/gain, headaches, insomnia,  night sweats or change in appetite. Does c/o fatigue. Eyes: Denies redness, blurred vision, diplopia, discharge, itchy or watery eyes.  ENT: Denies discharge, congestion, post nasal drip, epistaxis, sore throat, earache, hearing loss, dental pain, Tinnitus, Vertigo, Sinus pain or snoring.  Cardio: Denies chest pain, palpitations, irregular heartbeat, syncope, dyspnea, diaphoresis, orthopnea, PND, claudication or edema Respiratory: denies cough, dyspnea, DOE, pleurisy, hoarseness, laryngitis or wheezing.  Gastrointestinal: Denies dysphagia, heartburn, reflux, water brash, pain, cramps, nausea, vomiting, bloating, diarrhea, constipation, hematemesis, melena, hematochezia, jaundice or hemorrhoids Genitourinary: Denies dysuria, frequency, urgency, nocturia, hesitancy, discharge, hematuria or flank pain Musculoskeletal: Denies arthralgia, myalgia, stiffness, Jt. Swelling, pain, limp or strain/sprain. Denies Falls. Skin: Denies puritis, rash, hives, warts, acne, eczema or change in skin lesion Neuro: No weakness, tremor, incoordination, spasms, paresthesia or pain Psychiatric: Denies confusion, memory loss or sensory loss. Denies Depression. Endocrine: Denies change in weight,  skin, hair change, nocturia, and paresthesia, diabetic polys, visual blurring or hyper / hypo glycemic episodes.  Heme/Lymph: No excessive bleeding, bruising or enlarged lymph nodes.  Physical Exam  BP (!) 142/80   Pulse 76   Temp 97.3 F (36.3 C)   Resp 16   Ht 5' 11.75" (1.822 m)   Wt 164 lb 3.2 oz (74.5 kg)   BMI 22.42 kg/m   General Appearance: Well nourished, in no apparent distress.  Eyes: PERRLA, EOMs, conjunctiva no swelling or erythema, normal fundi and vessels. Sinuses: No frontal/maxillary tenderness ENT/Mouth: EACs patent / TMs  nl. Nares clear without erythema, swelling, mucoid exudates. Oral hygiene is good. No erythema, swelling, or exudate. Tongue normal, non-obstructing. Tonsils not swollen or erythematous. Hearing normal.  Neck: Supple, thyroid normal. No bruits, nodes or JVD. Respiratory: Respiratory effort normal.  BS equal and clear bilateral without rales, rhonci, wheezing or stridor. Cardio: Heart sounds are normal with regular rate and rhythm and no murmurs, rubs or gallops.  Peripheral pulses are normal and equal bilaterally without edema. No aortic or femoral bruits. Chest: symmetric with normal excursions and percussion.  Abdomen: Soft, with Nl bowel sounds. Nontender, no guarding, rebound, hernias, masses, or organomegaly.  Lymphatics: Non tender without lymphadenopathy.  Genitourinary: No hernias.Testes nl. DRE - prostate nl for age - smooth & firm w/o nodules. Musculoskeletal: Full ROM all peripheral extremities, joint stability, 5/5 strength, and normal gait. Skin: Warm and dry without rashes, lesions, cyanosis, clubbing or  ecchymosis.  Neuro: Cranial nerves intact, reflexes equal bilaterally. Normal muscle tone, no cerebellar symptoms. Sensation intact.  Pysch: Alert and oriented X 3 with normal affect, insight and judgment appropriate.   Assessment and Plan  1. Annual Preventative/Screening Exam        Continue prudent diet as discussed, weight  control, BP monitoring, regular exercise, and medications as discussed.  Discussed med effects and SE's. Routine screening labs and tests as requested with regular follow-up as recommended. Over 40 minutes of exam, counseling, chart review and high complex critical decision making was performed

## 2016-09-25 ENCOUNTER — Other Ambulatory Visit: Payer: Self-pay | Admitting: Internal Medicine

## 2016-09-25 DIAGNOSIS — D72820 Lymphocytosis (symptomatic): Secondary | ICD-10-CM

## 2016-09-25 LAB — BASIC METABOLIC PANEL WITH GFR
BUN: 17 mg/dL (ref 7–25)
CO2: 26 mmol/L (ref 20–31)
CREATININE: 0.86 mg/dL (ref 0.70–1.18)
Calcium: 10.1 mg/dL (ref 8.6–10.3)
Chloride: 104 mmol/L (ref 98–110)
GFR, EST NON AFRICAN AMERICAN: 85 mL/min (ref >=60)
GLUCOSE: 71 mg/dL (ref 65–99)
Potassium: 3.9 mmol/L (ref 3.5–5.3)
Sodium: 141 mmol/L (ref 135–146)

## 2016-09-25 LAB — LIPID PANEL
CHOLESTEROL: 175 mg/dL (ref <200)
HDL: 53 mg/dL (ref >40)
LDL Cholesterol: 95 mg/dL (ref <100)
TRIGLYCERIDES: 137 mg/dL (ref <150)
Total CHOL/HDL Ratio: 3.3 Ratio (ref <5.0)
VLDL: 27 mg/dL (ref <30)

## 2016-09-25 LAB — HEPATIC FUNCTION PANEL
ALBUMIN: 4.1 g/dL (ref 3.6–5.1)
ALT: 19 U/L (ref 9–46)
AST: 19 U/L (ref 10–35)
Alkaline Phosphatase: 65 U/L (ref 40–115)
BILIRUBIN INDIRECT: 0.2 mg/dL (ref 0.2–1.2)
Bilirubin, Direct: 0.1 mg/dL (ref <=0.2)
TOTAL PROTEIN: 6.8 g/dL (ref 6.1–8.1)
Total Bilirubin: 0.3 mg/dL (ref 0.2–1.2)

## 2016-09-25 LAB — CBC WITH DIFFERENTIAL/PLATELET
BASOS ABS: 0 {cells}/uL (ref 0–200)
BASOS PCT: 0 %
EOS ABS: 0 {cells}/uL — AB (ref 15–500)
Eosinophils Relative: 0 %
HEMATOCRIT: 44.6 % (ref 38.5–50.0)
HEMOGLOBIN: 14.3 g/dL (ref 13.2–17.1)
LYMPHS ABS: 21252 {cells}/uL — AB (ref 850–3900)
Lymphocytes Relative: 84 %
MCH: 30.1 pg (ref 27.0–33.0)
MCHC: 32.1 g/dL (ref 32.0–36.0)
MCV: 93.9 fL (ref 80.0–100.0)
MONO ABS: 759 {cells}/uL (ref 200–950)
MPV: 11.8 fL (ref 7.5–12.5)
Monocytes Relative: 3 %
NEUTROS ABS: 3289 {cells}/uL (ref 1500–7800)
Neutrophils Relative %: 13 %
PLATELETS: 168 10*3/uL (ref 140–400)
RBC: 4.75 MIL/uL (ref 4.20–5.80)
RDW: 13.7 % (ref 11.0–15.0)
WBC: 25.3 10*3/uL — ABNORMAL HIGH (ref 3.8–10.8)

## 2016-09-25 LAB — INSULIN, RANDOM: INSULIN: 7.9 u[IU]/mL (ref 2.0–19.6)

## 2016-09-25 LAB — URINALYSIS, ROUTINE W REFLEX MICROSCOPIC
Bilirubin Urine: NEGATIVE
GLUCOSE, UA: NEGATIVE
HGB URINE DIPSTICK: NEGATIVE
Ketones, ur: NEGATIVE
LEUKOCYTES UA: NEGATIVE
NITRITE: NEGATIVE
PH: 6 (ref 5.0–8.0)
Protein, ur: NEGATIVE
SPECIFIC GRAVITY, URINE: 1.019 (ref 1.001–1.035)

## 2016-09-25 LAB — MICROALBUMIN / CREATININE URINE RATIO
Creatinine, Urine: 131 mg/dL (ref 20–370)
MICROALB UR: 0.7 mg/dL
Microalb Creat Ratio: 5 mcg/mg creat (ref <30)

## 2016-09-25 LAB — HEMOGLOBIN A1C
HEMOGLOBIN A1C: 5.2 % (ref <5.7)
MEAN PLASMA GLUCOSE: 103 mg/dL

## 2016-09-25 LAB — MAGNESIUM: MAGNESIUM: 2.2 mg/dL (ref 1.5–2.5)

## 2016-09-25 LAB — VITAMIN D 25 HYDROXY (VIT D DEFICIENCY, FRACTURES): VIT D 25 HYDROXY: 88 ng/mL (ref 30–100)

## 2016-10-08 ENCOUNTER — Other Ambulatory Visit: Payer: Self-pay | Admitting: Internal Medicine

## 2016-10-09 ENCOUNTER — Ambulatory Visit (INDEPENDENT_AMBULATORY_CARE_PROVIDER_SITE_OTHER): Payer: Medicare HMO

## 2016-10-09 DIAGNOSIS — D72820 Lymphocytosis (symptomatic): Secondary | ICD-10-CM

## 2016-10-09 LAB — CBC WITH DIFFERENTIAL/PLATELET
Basophils Absolute: 0 cells/uL (ref 0–200)
Basophils Relative: 0 %
Eosinophils Absolute: 0 cells/uL — ABNORMAL LOW (ref 15–500)
Eosinophils Relative: 0 %
HEMATOCRIT: 41.5 % (ref 38.5–50.0)
Hemoglobin: 13.6 g/dL (ref 13.2–17.1)
LYMPHS PCT: 84 %
Lymphs Abs: 14448 cells/uL — ABNORMAL HIGH (ref 850–3900)
MCH: 30.3 pg (ref 27.0–33.0)
MCHC: 32.8 g/dL (ref 32.0–36.0)
MCV: 92.4 fL (ref 80.0–100.0)
MONOS PCT: 0 %
MPV: 12.5 fL (ref 7.5–12.5)
Monocytes Absolute: 0 cells/uL — ABNORMAL LOW (ref 200–950)
NEUTROS PCT: 16 %
Neutro Abs: 2752 cells/uL (ref 1500–7800)
PLATELETS: 178 10*3/uL (ref 140–400)
RBC: 4.49 MIL/uL (ref 4.20–5.80)
RDW: 13.9 % (ref 11.0–15.0)
WBC: 17.2 10*3/uL — AB (ref 3.8–10.8)

## 2016-10-09 NOTE — Progress Notes (Signed)
PT PRESENTS FOR LAB WORK WITH NO QUESTIONS OR CONCERNS.

## 2016-10-10 ENCOUNTER — Other Ambulatory Visit: Payer: Self-pay | Admitting: Internal Medicine

## 2016-10-10 DIAGNOSIS — C9110 Chronic lymphocytic leukemia of B-cell type not having achieved remission: Secondary | ICD-10-CM

## 2016-10-13 LAB — FETOMATERNAL BLEED, FLOW CYTOMETRY
FETOMATERNAL HEMORRHAGE: 0 mL
Fetal Cells: <=0.1 % (ref <=0.1)

## 2016-10-14 ENCOUNTER — Telehealth: Payer: Self-pay | Admitting: Hematology

## 2016-10-14 NOTE — Telephone Encounter (Signed)
Tc to the pt to schedule an appt. Appt has been made for the pt to see Dr. Irene Limbo on 1/18 at 830am. Aware to arrive 30 minutes early. Agreed to the appt date and time.

## 2016-10-15 ENCOUNTER — Telehealth: Payer: Self-pay | Admitting: *Deleted

## 2016-10-15 NOTE — Telephone Encounter (Signed)
Sw pt regarding 1/18 apt.  Due to inclement weather, pt wishes to reschedule.  Scheduling message sent, pt aware.

## 2016-10-16 ENCOUNTER — Ambulatory Visit: Payer: Commercial Managed Care - HMO | Admitting: Hematology

## 2016-10-17 ENCOUNTER — Telehealth: Payer: Self-pay | Admitting: Hematology

## 2016-10-17 NOTE — Telephone Encounter (Signed)
Returned call to wife re rescheduling 1/18 new patient appointment. Checked with HIM and Dr. Irene Limbo can see patient in lunch slot on any day next week.   Gave wife new appointments for 1/23 @ 12pm - patient not able to come 0000000 due to conflict.

## 2016-10-21 ENCOUNTER — Encounter: Payer: Self-pay | Admitting: Hematology

## 2016-10-21 ENCOUNTER — Other Ambulatory Visit: Payer: Self-pay | Admitting: Internal Medicine

## 2016-10-21 ENCOUNTER — Other Ambulatory Visit (HOSPITAL_COMMUNITY)
Admission: RE | Admit: 2016-10-21 | Discharge: 2016-10-21 | Disposition: A | Discharge status: Home / Self Care | Payer: Medicare HMO | Source: Ambulatory Visit | Attending: Hematology | Admitting: Hematology

## 2016-10-21 ENCOUNTER — Ambulatory Visit (HOSPITAL_BASED_OUTPATIENT_CLINIC_OR_DEPARTMENT_OTHER): Payer: Medicare HMO | Admitting: Hematology

## 2016-10-21 ENCOUNTER — Telehealth: Payer: Self-pay | Admitting: Hematology

## 2016-10-21 ENCOUNTER — Ambulatory Visit (HOSPITAL_BASED_OUTPATIENT_CLINIC_OR_DEPARTMENT_OTHER): Payer: Medicare HMO

## 2016-10-21 VITALS — BP 135/62 | HR 63 | Temp 98.0°F | Resp 18 | Ht 71.0 in | Wt 166.6 lb

## 2016-10-21 DIAGNOSIS — C9110 Chronic lymphocytic leukemia of B-cell type not having achieved remission: Secondary | ICD-10-CM

## 2016-10-21 DIAGNOSIS — D72820 Lymphocytosis (symptomatic): Secondary | ICD-10-CM

## 2016-10-21 DIAGNOSIS — C8590 Non-Hodgkin lymphoma, unspecified, unspecified site: Secondary | ICD-10-CM

## 2016-10-21 LAB — CBC & DIFF AND RETIC
BASO%: 0.3 % (ref 0.0–2.0)
BASOS ABS: 0.1 10*3/uL (ref 0.0–0.1)
EOS ABS: 0 10*3/uL (ref 0.0–0.5)
EOS%: 0.2 % (ref 0.0–7.0)
HEMATOCRIT: 43.8 % (ref 38.4–49.9)
HEMOGLOBIN: 14.5 g/dL (ref 13.0–17.1)
Immature Retic Fract: 4.5 % (ref 3.00–10.60)
LYMPH#: 17.6 10*3/uL — AB (ref 0.9–3.3)
LYMPH%: 84.8 % — ABNORMAL HIGH (ref 14.0–49.0)
MCH: 30.1 pg (ref 27.2–33.4)
MCHC: 33.1 g/dL (ref 32.0–36.0)
MCV: 90.9 fL (ref 79.3–98.0)
MONO#: 0.6 10*3/uL (ref 0.1–0.9)
MONO%: 2.9 % (ref 0.0–14.0)
NEUT#: 2.5 10*3/uL (ref 1.5–6.5)
NEUT%: 11.8 % — ABNORMAL LOW (ref 39.0–75.0)
Platelets: 157 10*3/uL (ref 140–400)
RBC: 4.82 10*6/uL (ref 4.20–5.82)
RDW: 13.3 % (ref 11.0–14.6)
RETIC %: 0.82 % (ref 0.80–1.80)
RETIC CT ABS: 39.52 10*3/uL (ref 34.80–93.90)
WBC: 20.8 10*3/uL — ABNORMAL HIGH (ref 4.0–10.3)

## 2016-10-21 LAB — COMPREHENSIVE METABOLIC PANEL WITH GFR
ALT: 16 U/L (ref 0–55)
AST: 17 U/L (ref 5–34)
Albumin: 3.9 g/dL (ref 3.5–5.0)
Alkaline Phosphatase: 69 U/L (ref 40–150)
Anion Gap: 7 meq/L (ref 3–11)
BUN: 16.2 mg/dL (ref 7.0–26.0)
CO2: 28 meq/L (ref 22–29)
Calcium: 10.1 mg/dL (ref 8.4–10.4)
Chloride: 107 meq/L (ref 98–109)
Creatinine: 1.1 mg/dL (ref 0.7–1.3)
EGFR: 74 ml/min/1.73 m2 — ABNORMAL LOW
Glucose: 90 mg/dL (ref 70–140)
Potassium: 4.5 meq/L (ref 3.5–5.1)
Sodium: 142 meq/L (ref 136–145)
Total Bilirubin: 0.46 mg/dL (ref 0.20–1.20)
Total Protein: 7.1 g/dL (ref 6.4–8.3)

## 2016-10-21 LAB — TECHNOLOGIST REVIEW

## 2016-10-21 LAB — CHCC SMEAR

## 2016-10-21 LAB — LACTATE DEHYDROGENASE: LDH: 159 U/L (ref 125–245)

## 2016-10-21 NOTE — Progress Notes (Signed)
Marland Kitchen    HEMATOLOGY/ONCOLOGY CONSULTATION NOTE  Date of Service: 10/21/2016  Patient Care Team: Unk Pinto, MD as PCP - General (Internal Medicine) Inda Castle, MD as Consulting Physician (Gastroenterology) Melissa Noon, OD as Referring Physician (Optometry) Doran Stabler, MD as Consulting Physician (Gastroenterology)  CHIEF COMPLAINTS/PURPOSE OF CONSULTATION:  lymphocytosis  HISTORY OF PRESENTING ILLNESS:   Javier [name] is a wonderful 76 y.o. male who has been referred to [place] by Dr .Alesia Richards, MD for evaluation and management of lymphocytosis concerning for a lymphoproliferative process.  Patient has a h/o HTN, HLD, Prediabetes who notes that he had significant abdominal discomfort with significant dyspeptic symptoms around aug 2017 and had about 20-25lbs weight loss and had an extensive GI workup including CT abd/pelvis 05/25/2016 - WNL, EGD which showed chronic gastritis (helicobacter pylori +ve) and colonscopy with some polyps. Capsule endoscopy was not approved by insurance. Patients symptoms improved after treatment of H.pylori and he notes that he has gained back about 6-7 lbs and is eating better.  Patients labs were noted to show some leucocytosis/Lymphocytosis for which he was referred to [place] for further evaluation.  Patient notes some nightsweats and weight loss (as noted above, possible alternative explanation). No fevers/chills. Has not noted any overt enlarged LN and CT abd in 04/2016 showed no hepatomegaly or splenomegaly and no abd/RP LNadenopathy.  Patient notes no other acute focal symptoms.   MEDICAL HISTORY:  Past Medical History:  Diagnosis Date  . Diverticulosis   . Hyperlipidemia   . Hypertension   . Prediabetes   . Vitamin D deficiency   Helicobacter pylori +ve   SURGICAL HISTORY: Past Surgical History:  Procedure Laterality Date  . NO PAST SURGERIES      SOCIAL HISTORY: Social History   Social History  . Marital status:  Married    Spouse name: N/A  . Number of children: 5  . Years of education: N/A   Occupational History  . retired    Social History Main Topics  . Smoking status: Former Smoker    Types: Cigarettes    Quit date: 01/17/2003  . Smokeless tobacco: Never Used     Comment: Occasional cigars.  . Alcohol use Yes     Comment: very rare  . Drug use: No  . Sexual activity: Not on file   Other Topics Concern  . Not on file   Social History Narrative  . No narrative on file  Quit smoking >10 yrs ago, previously 1.5Packs per week. Started smoking in his teens. Retired. Worked as a Chief Financial Officer.   FAMILY HISTORY: Family History  Problem Relation Age of Onset  . Stroke Mother   . Early death Father     Farm/tractor accident  . Diabetes Brother   . Cirrhosis Brother   . Colon cancer Paternal Grandfather     ALLERGIES:  is allergic to penicillins and ppd [tuberculin purified protein derivative].  MEDICATIONS:  Current Outpatient Prescriptions  Medication Sig Dispense Refill  . ALPRAZolam (XANAX) 0.5 MG tablet TAKE ONE-HALF TABLET BY MOUTH THREE TIMES DAILY BEFORE MEALS AND  ONE  AT  BEDTIME 90 tablet 1  . aspirin EC 81 MG tablet Take 81 mg by mouth daily.    . benazepril-hydrochlorthiazide (LOTENSIN HCT) 20-25 MG tablet TAKE 1 TABLET EVERY DAY  FOR  BLOOD  PRESSURE 90 tablet 1  . Cholecalciferol (VITAMIN D3) 5000 units TABS Take 5,000 Units by mouth daily.    . pantoprazole (PROTONIX) 40 MG tablet Take  1 tablet (40 mg total) by mouth daily. 14 tablet 0  . pravastatin (PRAVACHOL) 40 MG tablet TAKE 1 TABLET AT BEDTIME  FOR  CHOLESTEROL 90 tablet 3   No current facility-administered medications for this visit.     REVIEW OF SYSTEMS:    10 Point review of Systems was done is negative except as noted above.  PHYSICAL EXAMINATION: ECOG PERFORMANCE STATUS: 1 - Symptomatic but completely ambulatory  . Vitals:   10/21/16 1135  BP: 135/62  Pulse: 63  Resp: 18  Temp: 98 F  (36.7 C)   Filed Weights   10/21/16 1135  Weight: 166 lb 9.6 oz (75.6 kg)   .Body mass index is 23.24 kg/m.  GENERAL:alert, in no acute distress and comfortable SKIN: skin color, texture, turgor are normal, no rashes or significant lesions EYES: normal, conjunctiva are pink and non-injected, sclera clear OROPHARYNX:no exudate, no erythema and lips, buccal mucosa, and tongue normal  NECK: supple, no JVD, thyroid normal size, non-tender, without nodularity LYMPH:  no palpable lymphadenopathy in the cervical, axillary or inguinal LUNGS: clear to auscultation with normal respiratory effort HEART: regular rate & rhythm,  no murmurs and no lower extremity edema ABDOMEN: abdomen soft, non-tender, normoactive bowel sounds  Musculoskeletal: no cyanosis of digits and no clubbing  PSYCH: alert & oriented x 3 with fluent speech NEURO: no focal motor/sensory deficits  LABORATORY DATA:  I have reviewed the data as listed  . CBC Latest Ref Rng & Units 10/21/2016 10/09/2016 09/24/2016  WBC 4.0 - 10.3 10e3/uL 20.8(H) 17.2(H) 25.3(H)  Hemoglobin 13.0 - 17.1 g/dL 14.5 13.6 14.3  Hematocrit 38.4 - 49.9 % 43.8 41.5 44.6  Platelets 140 - 400 10e3/uL 157 178 168   . CBC    Component Value Date/Time   WBC 20.8 (H) 10/21/2016 1324   WBC 17.2 (H) 10/09/2016 0905   RBC 4.82 10/21/2016 1324   RBC 4.49 10/09/2016 0905   HGB 14.5 10/21/2016 1324   HCT 43.8 10/21/2016 1324   PLT 157 10/21/2016 1324   MCV 90.9 10/21/2016 1324   MCH 30.1 10/21/2016 1324   MCH 30.3 10/09/2016 0905   MCHC 33.1 10/21/2016 1324   MCHC 32.8 10/09/2016 0905   RDW 13.3 10/21/2016 1324   LYMPHSABS 17.6 (H) 10/21/2016 1324   MONOABS 0.6 10/21/2016 1324   EOSABS 0.0 10/21/2016 1324   BASOSABS 0.1 10/21/2016 1324     . CMP Latest Ref Rng & Units 09/24/2016 06/16/2016 06/05/2016  Glucose 65 - 99 mg/dL 71 110(H) 95  BUN 7 - 25 mg/dL 17 14 11   Creatinine 0.70 - 1.18 mg/dL 0.86 0.93 0.91  Sodium 135 - 146 mmol/L 141 141 140   Potassium 3.5 - 5.3 mmol/L 3.9 3.8 3.3(L)  Chloride 98 - 110 mmol/L 104 105 106  CO2 20 - 31 mmol/L 26 28 29   Calcium 8.6 - 10.3 mg/dL 10.1 9.8 9.6  Total Protein 6.1 - 8.1 g/dL 6.8 6.4 6.2(L)  Total Bilirubin 0.2 - 1.2 mg/dL 0.3 0.4 0.6  Alkaline Phos 40 - 115 U/L 65 44 43  AST 10 - 35 U/L 19 16 24   ALT 9 - 46 U/L 19 12 23    . Lab Results  Component Value Date   LDH 159 10/21/2016      RADIOGRAPHIC STUDIES: I have personally reviewed the radiological images as listed and agreed with the findings in the report. No results found.  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 WBC count now is 20.8K with lymphocytosis of 17.6k Differential diagnosis includes - CD5 neg CLL vs splenic lymphoma vs Marginal Zone lymphoma vs LPL (lymphoplasmacytic lymphoma) LDH WNL suggests against a high grade process. Currently no anemia or thrombocytopenia noted. Some nightsweat, no fevers/chills. Weight loss due to dyspepsia from h.pylori gastritis -- improving - patient has regained about 6-7 lbs and is eating better. Fatigue is improving PLAN -will check peripheral blood FISH to determine if patient has a mutation common for CLL since about 20% of CLL can be CD 5 neg. -Will get a PET/CT for initial staging of his monoclonal B lymphoproliferative process -- leukemia/lymphoma -need for BM Bx based on above results -discussed with patient possibility and workup  -RTC with Dr Irene Limbo in about with PET/CT and above lab results  2) . Patient Active Problem List   Diagnosis Date Noted  . Atypical lymphocytosis 10/21/2016  . Encounter for Medicare annual wellness exam 09/11/2015  . GERD  02/09/2015  . Medication management 11/02/2013  . Hyperlipidemia   . Hypertension   . Prediabetes   . Vitamin D deficiency   . Diverticulosis    -f/u with PCP for management of other medical co-morbids  3) H.pylori  gastritis -f/u with GI to ensure eradication of h.pylori post treatment.   All of the patients questions were answered with apparent satisfaction. The patient knows to call the clinic with any problems, questions or concerns.  I spent 50 minutes counseling the patient face to face. The total time spent in the appointment was 60 minutes and more than 50% was on counseling and direct patient cares.    Sullivan Lone MD [place] AAHIVMS [facility] Hematology/Oncology Physician [facility]  (Office):       [phone] (Work cell):  [phone] (Fax):           [phone]  10/21/2016 11:54 AM

## 2016-10-21 NOTE — Telephone Encounter (Signed)
Appointments scheduled per 1/23 LOS. Patient given AVS report and calendars with future scheduled appointments. °

## 2016-10-22 ENCOUNTER — Ambulatory Visit: Payer: Commercial Managed Care - HMO

## 2016-10-27 ENCOUNTER — Telehealth: Payer: Self-pay | Admitting: *Deleted

## 2016-10-27 ENCOUNTER — Other Ambulatory Visit: Payer: Self-pay | Admitting: *Deleted

## 2016-10-27 ENCOUNTER — Telehealth: Payer: Self-pay

## 2016-10-27 LAB — FLOW CYTOMETRY

## 2016-10-27 NOTE — Telephone Encounter (Signed)
S/w wife with pt in background the attached message from Dr Irene Limbo. Instructed them to expect a phone call from schedulers.

## 2016-10-27 NOTE — Telephone Encounter (Signed)
Per Dr. Irene Limbo LVM with pt asking for call back.  Lab work is not indicative of CLL at this time.  Pt needs PET for further evaluation prior to f/u with Dr. Irene Limbo in 2 weeks.  Order is placed for PET scan.

## 2016-10-27 NOTE — Telephone Encounter (Signed)
error 

## 2016-10-31 ENCOUNTER — Encounter (HOSPITAL_COMMUNITY)
Admission: RE | Admit: 2016-10-31 | Discharge: 2016-10-31 | Disposition: A | Discharge status: Home / Self Care | Payer: Medicare HMO | Source: Ambulatory Visit | Attending: Hematology | Admitting: Hematology

## 2016-10-31 DIAGNOSIS — C8590 Non-Hodgkin lymphoma, unspecified, unspecified site: Secondary | ICD-10-CM | POA: Insufficient documentation

## 2016-10-31 LAB — FISH,CLL PROGNOSTIC PANEL

## 2016-10-31 LAB — GLUCOSE, CAPILLARY: GLUCOSE-CAPILLARY: 90 mg/dL (ref 65–99)

## 2016-10-31 MED ORDER — FLUDEOXYGLUCOSE F - 18 (FDG) INJECTION
8.2100 | Freq: Once | INTRAVENOUS | Status: AC | PRN
  Administered 2016-10-31: 8.21 via INTRAVENOUS

## 2016-11-01 ENCOUNTER — Other Ambulatory Visit: Payer: Self-pay | Admitting: Internal Medicine

## 2016-11-01 DIAGNOSIS — C9110 Chronic lymphocytic leukemia of B-cell type not having achieved remission: Secondary | ICD-10-CM | POA: Insufficient documentation

## 2016-11-06 ENCOUNTER — Encounter: Payer: Self-pay | Admitting: Hematology

## 2016-11-06 ENCOUNTER — Telehealth: Payer: Self-pay | Admitting: Hematology

## 2016-11-06 ENCOUNTER — Ambulatory Visit (HOSPITAL_BASED_OUTPATIENT_CLINIC_OR_DEPARTMENT_OTHER): Payer: Medicare HMO | Admitting: Hematology

## 2016-11-06 VITALS — BP 137/69 | HR 66 | Temp 97.4°F | Resp 18 | Ht 71.0 in | Wt 173.3 lb

## 2016-11-06 DIAGNOSIS — C8590 Non-Hodgkin lymphoma, unspecified, unspecified site: Secondary | ICD-10-CM

## 2016-11-06 DIAGNOSIS — C9110 Chronic lymphocytic leukemia of B-cell type not having achieved remission: Secondary | ICD-10-CM | POA: Diagnosis not present

## 2016-11-06 NOTE — Telephone Encounter (Signed)
Appointments scheduled per 2/8 LOS. Patient given AVS report and calendars with future scheduled appointments.  °

## 2016-11-09 NOTE — Progress Notes (Signed)
Marland Kitchen    HEMATOLOGY/ONCOLOGY CLINIC NOTE  Date of Service: .11/06/2016  Patient Care Team: Unk Pinto, MD as PCP - General (Internal Medicine) Inda Castle, MD as Consulting Physician (Gastroenterology) Melissa Noon, OD as Referring Physician (Optometry) Doran Stabler, MD as Consulting Physician (Gastroenterology)  CHIEF COMPLAINTS/PURPOSE OF CONSULTATION:  F/u for monoclonal B cell leukemia - likely CD5 neg CLL   HISTORY OF PRESENTING ILLNESS:   Javier [name] is a wonderful 76 y.o. male who has been referred to [place] by Dr .Alesia Richards, MD for evaluation and management of lymphocytosis concerning for a lymphoproliferative process.  Patient has a h/o HTN, HLD, Prediabetes who notes that he had significant abdominal discomfort with significant dyspeptic symptoms around aug 2017 and had about 20-25lbs weight loss and had an extensive GI workup including CT abd/pelvis 05/25/2016 - WNL, EGD which showed chronic gastritis (helicobacter pylori +ve) and colonscopy with some polyps. Capsule endoscopy was not approved by insurance. Patients symptoms improved after treatment of H.pylori and he notes that he has gained back about 6-7 lbs and is eating better.  Patients labs were noted to show some leucocytosis/Lymphocytosis for which he was referred to [place] for further evaluation.  Patient notes some nightsweats and weight loss (as noted above, possible alternative explanation). No fevers/chills. Has not noted any overt enlarged LN and CT abd in 04/2016 showed no hepatomegaly or splenomegaly and no abd/RP LNadenopathy.  Patient notes no other acute focal symptoms.   INTERVAL HISTORY  Javier [name] is here for follow-up with his wife for discussion of his workup for lymphocytosis and his PET scan results . We discussed the results in detail and his goal of care.  MEDICAL HISTORY:  Past Medical History:  Diagnosis Date  . Diverticulosis   . Hyperlipidemia   . Hypertension   .  Prediabetes   . Vitamin D deficiency   Helicobacter pylori +ve   SURGICAL HISTORY: Past Surgical History:  Procedure Laterality Date  . NO PAST SURGERIES      SOCIAL HISTORY: Social History   Social History  . Marital status: Married    Spouse name: N/A  . Number of children: 5  . Years of education: N/A   Occupational History  . retired    Social History Main Topics  . Smoking status: Former Smoker    Types: Cigarettes    Quit date: 01/17/2003  . Smokeless tobacco: Never Used     Comment: Occasional cigars.  . Alcohol use Yes     Comment: very rare  . Drug use: No  . Sexual activity: Not on file   Other Topics Concern  . Not on file   Social History Narrative  . No narrative on file  Quit smoking >10 yrs ago, previously 1.5Packs per week. Started smoking in his teens. Retired. Worked as a Chief Financial Officer.   FAMILY HISTORY: Family History  Problem Relation Age of Onset  . Stroke Mother   . Early death Father     Farm/tractor accident  . Diabetes Brother   . Cirrhosis Brother   . Colon cancer Paternal Grandfather     ALLERGIES:  is allergic to penicillins and ppd [tuberculin purified protein derivative].  MEDICATIONS:  Current Outpatient Prescriptions  Medication Sig Dispense Refill  . ALPRAZolam (XANAX) 0.5 MG tablet TAKE ONE-HALF TABLET BY MOUTH THREE TIMES DAILY BEFORE MEALS AND  ONE  AT  BEDTIME 90 tablet 1  . aspirin EC 81 MG tablet Take 81 mg by mouth daily.    Marland Kitchen  benazepril-hydrochlorthiazide (LOTENSIN HCT) 20-25 MG tablet TAKE 1 TABLET EVERY DAY  FOR  BLOOD  PRESSURE 90 tablet 1  . Cholecalciferol (VITAMIN D3) 5000 units TABS Take 5,000 Units by mouth daily.    . pantoprazole (PROTONIX) 40 MG tablet Take 1 tablet (40 mg total) by mouth daily. 14 tablet 0  . pravastatin (PRAVACHOL) 40 MG tablet TAKE 1 TABLET AT BEDTIME  FOR  CHOLESTEROL 90 tablet 3   No current facility-administered medications for this visit.     REVIEW OF SYSTEMS:    10  Point review of Systems was done is negative except as noted above.  PHYSICAL EXAMINATION: ECOG PERFORMANCE STATUS: 1 - Symptomatic but completely ambulatory  . Vitals:   11/06/16 1053  BP: 137/69  Pulse: 66  Resp: 18  Temp: 97.4 F (36.3 C)   Filed Weights   11/06/16 1053  Weight: 173 lb 4.8 oz (78.6 kg)   .Body mass index is 24.17 kg/m.  GENERAL:alert, in no acute distress and comfortable SKIN: skin color, texture, turgor are normal, no rashes or significant lesions EYES: normal, conjunctiva are pink and non-injected, sclera clear OROPHARYNX:no exudate, no erythema and lips, buccal mucosa, and tongue normal  NECK: supple, no JVD, thyroid normal size, non-tender, without nodularity LYMPH:  no palpable lymphadenopathy in the cervical, axillary or inguinal LUNGS: clear to auscultation with normal respiratory effort HEART: regular rate & rhythm,  no murmurs and no lower extremity edema ABDOMEN: abdomen soft, non-tender, normoactive bowel sounds  Musculoskeletal: no cyanosis of digits and no clubbing  PSYCH: alert & oriented x 3 with fluent speech NEURO: no focal motor/sensory deficits  LABORATORY DATA:  I have reviewed the data as listed  . CBC Latest Ref Rng & Units 10/21/2016 10/09/2016 09/24/2016  WBC 4.0 - 10.3 10e3/uL 20.8(H) 17.2(H) 25.3(H)  Hemoglobin 13.0 - 17.1 g/dL 14.5 13.6 14.3  Hematocrit 38.4 - 49.9 % 43.8 41.5 44.6  Platelets 140 - 400 10e3/uL 157 178 168   . CBC    Component Value Date/Time   WBC 20.8 (H) 10/21/2016 1324   WBC 17.2 (H) 10/09/2016 0905   RBC 4.82 10/21/2016 1324   RBC 4.49 10/09/2016 0905   HGB 14.5 10/21/2016 1324   HCT 43.8 10/21/2016 1324   PLT 157 10/21/2016 1324   MCV 90.9 10/21/2016 1324   MCH 30.1 10/21/2016 1324   MCH 30.3 10/09/2016 0905   MCHC 33.1 10/21/2016 1324   MCHC 32.8 10/09/2016 0905   RDW 13.3 10/21/2016 1324   LYMPHSABS 17.6 (H) 10/21/2016 1324   MONOABS 0.6 10/21/2016 1324   EOSABS 0.0 10/21/2016 1324    BASOSABS 0.1 10/21/2016 1324     . CMP Latest Ref Rng & Units 10/21/2016 09/24/2016 06/16/2016  Glucose 70 - 140 mg/dl 90 71 110(H)  BUN 7.0 - 26.0 mg/dL 16._0 Creatinine 0.7 - 1.3 mg/dL 1.1 0.86 0.93  Sodium 136 - 145 mEq/L 142 141 141  Potassium 3.5 - 5.1 mEq/L 4.5 3.9 3.8  Chloride 98 - 110 mmol/L - 104 105  CO2 22 - 29 mEq/L _1 Calcium 8.4 - 10.4 mg/dL 10.1 10.1 9.8  Total Protein 6.4 - 8.3 g/dL 7.1 6.8 6.4  Total Bilirubin 0.20 - 1.20 mg/dL 0.46 0.3 0.4  Alkaline Phos 40 - 150 U/L 69 65 44  AST 5 - 34 U/L _2 ALT 0 - 55 U/L _3 . Lab Results  Component Value Date   LDH 159  10/21/2016           RADIOGRAPHIC STUDIES: I have personally reviewed the radiological images as listed and agreed with the findings in the report. Nm Pet Image Initial (pi) Skull Base To Thigh  Result Date: 10/31/2016 CLINICAL DATA:  Initial Treatment strategy for non-Hodgkin's lymphoma. EXAM: NUCLEAR MEDICINE PET SKULL BASE TO THIGH TECHNIQUE: 8.2 mCi F-18 FDG was injected intravenously. Full-ring PET imaging was performed from the skull base to thigh after the radiotracer. CT data was obtained and used for attenuation correction and anatomic localization. FASTING BLOOD GLUCOSE:  Value: 90 mg/dl COMPARISON:  Multiple exams, including 05/25/2016 FINDINGS: NECK No hypermetabolic lymph nodes in the neck. CHEST Small hypermetabolic paratracheal, paraesophageal, bilateral hilar, and bilateral infrahilar lymph nodes. A representative right lower paratracheal lymph node measures 0.7 cm on image 67/4 and has a maximum SUV of 9.6. No axillary or subpectoral hypermetabolic adenopathy is observed. Paraseptal emphysema. Biapical pleuroparenchymal scarring. Atherosclerotic calcification of the aortic arch. The background blood pool activity SUV 3.5. ABDOMEN/PELVIS No abnormal hypermetabolic activity within the liver, pancreas, adrenal glands, or spleen. No hypermetabolic lymph nodes in the  abdomen or pelvis. Physiologic activity in loops of bowel, without CT correlate. Sigmoid colon diverticulosis. Aortoiliac atherosclerotic vascular disease. Enlarged prostate gland without hypermetabolic activity. Background hepatic activity SUV 4.5. SKELETON No focal hypermetabolic activity to suggest skeletal metastasis. Incidental mesoacromial os acromiale on the right. IMPRESSION: 1. Hypermetabolic nodes in the thorax including paratracheal, paraesophageal, bilateral hilar, and bilateral infrahilar nodes. These lymph nodes are small but metabolic activity is [place] 5. 2. Enlarged prostate gland. 3. Sigmoid colon diverticulosis. 4. Aortic atherosclerosis. 5. Paraseptal emphysema. Electronically Signed   By: Van Clines M.D.   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 FISH panel showed p53 (17p13) deletion that would be consistent with this diagnosis . It often represents also be more aggressive form of CLL . WBC count now is 20.8K with lymphocytosis of 17.6k Differential diagnosis includes - CD5 neg CLL vs splenic lymphoma vs Marginal Zone lymphoma vs LPL (lymphoplasmacytic lymphoma) LDH WNL suggests against a high grade process. Currently no anemia or thrombocytopenia noted. Some nightsweat, no fevers/chills. Weight loss due to dyspepsia from h.pylori gastritis -- improving - patient has regained about 6-7 lbs and is eating better. Fatigue is improving  PET/CT scan shows some small hypermetabolic nodes in the thorax including bilateral hilar paratracheal) esophageal. These would be difficult to biopsy.  PLAN - findings of the flow cytometry, FISH panel and PET/CT scan and all the other lab findings were discussed in details with the patient and his wife . -I discussed that this is likely CD5 negative CLL with a p53 mutation. We discussed that that the p53 mutation suggests  possibly more aggressive course of his CLL. -We discussed that it has not possible to help rule out other possible chronic B-cell lymphoproliferative processes. We discussed option of getting a bone marrow examination. Patient currently declines this. -a LN biopsy would have been helpful but he does not have any easily accessible lymph nodes to biopsy and we decided against this currently. -Plan would be to have close follow-up in 2 months with Dr. Irene Limbo with repeat labs. Earlier if any new concerns.   2) . Patient Active Problem List   Diagnosis Date Noted  . CLL (chronic lymphocytic leukemia) ([place]) 11/01/2016  . Atypical lymphocytosis 10/21/2016  .  Encounter for Medicare annual wellness exam 09/11/2015  . GERD  02/09/2015  . Medication management 11/02/2013  . Hyperlipidemia   . Hypertension   . Prediabetes   . Vitamin D deficiency   . Diverticulosis    -f/u with PCP for management of other medical co-morbids  3) H.pylori gastritis -f/u with GI to ensure eradication of h.pylori post treatment.   rtc with Dr Irene Limbo in 2 months with labs  All of the patients questions were answered with apparent satisfaction. The patient knows to call the clinic with any problems, questions or concerns.  I spent 20 minutes counseling the patient face to face. The total time spent in the appointment was 30 minutes and more than 50% was on counseling and direct patient cares.    Sullivan Lone MD [place] AAHIVMS [facility] Hematology/Oncology Physician [facility]  (Office):       [phone] (Work cell):  [phone] (Fax):           [phone]  11/09/2016 9:25 PM   all

## 2016-12-02 ENCOUNTER — Other Ambulatory Visit: Payer: Self-pay | Admitting: Physician Assistant

## 2016-12-02 NOTE — Telephone Encounter (Signed)
Please call Alprazolam 

## 2016-12-02 NOTE — Telephone Encounter (Signed)
Xanax was called into pharmacy on 6th March 2018 @ 3:24pm by DD

## 2016-12-30 ENCOUNTER — Ambulatory Visit (INDEPENDENT_AMBULATORY_CARE_PROVIDER_SITE_OTHER): Payer: Medicare HMO | Admitting: Internal Medicine

## 2016-12-30 ENCOUNTER — Encounter: Payer: Self-pay | Admitting: Internal Medicine

## 2016-12-30 VITALS — BP 122/60 | HR 58 | Temp 98.2°F | Resp 16 | Ht 71.0 in | Wt 173.0 lb

## 2016-12-30 DIAGNOSIS — Z79899 Other long term (current) drug therapy: Secondary | ICD-10-CM

## 2016-12-30 DIAGNOSIS — R7303 Prediabetes: Secondary | ICD-10-CM

## 2016-12-30 DIAGNOSIS — E782 Mixed hyperlipidemia: Secondary | ICD-10-CM

## 2016-12-30 DIAGNOSIS — K5790 Diverticulosis of intestine, part unspecified, without perforation or abscess without bleeding: Secondary | ICD-10-CM

## 2016-12-30 DIAGNOSIS — Z0000 Encounter for general adult medical examination without abnormal findings: Secondary | ICD-10-CM

## 2016-12-30 DIAGNOSIS — I10 Essential (primary) hypertension: Secondary | ICD-10-CM | POA: Diagnosis not present

## 2016-12-30 DIAGNOSIS — K2100 Gastro-esophageal reflux disease with esophagitis, without bleeding: Secondary | ICD-10-CM

## 2016-12-30 DIAGNOSIS — E559 Vitamin D deficiency, unspecified: Secondary | ICD-10-CM

## 2016-12-30 DIAGNOSIS — R6889 Other general symptoms and signs: Secondary | ICD-10-CM | POA: Diagnosis not present

## 2016-12-30 DIAGNOSIS — C9110 Chronic lymphocytic leukemia of B-cell type not having achieved remission: Secondary | ICD-10-CM | POA: Diagnosis not present

## 2016-12-30 DIAGNOSIS — Z0001 Encounter for general adult medical examination with abnormal findings: Secondary | ICD-10-CM | POA: Diagnosis not present

## 2016-12-30 LAB — HEPATIC FUNCTION PANEL
ALBUMIN: 3.9 g/dL (ref 3.6–5.1)
ALT: 11 U/L (ref 9–46)
AST: 15 U/L (ref 10–35)
Alkaline Phosphatase: 48 U/L (ref 40–115)
Bilirubin, Direct: 0.1 mg/dL (ref <=0.2)
Indirect Bilirubin: 0.5 mg/dL (ref 0.2–1.2)
TOTAL PROTEIN: 6.3 g/dL (ref 6.1–8.1)
Total Bilirubin: 0.6 mg/dL (ref 0.2–1.2)

## 2016-12-30 LAB — LIPID PANEL
CHOL/HDL RATIO: 3.3 ratio (ref <5.0)
CHOLESTEROL: 165 mg/dL (ref <200)
HDL: 50 mg/dL (ref >40)
LDL Cholesterol: 92 mg/dL (ref <100)
Triglycerides: 113 mg/dL (ref <150)
VLDL: 23 mg/dL (ref <30)

## 2016-12-30 LAB — BASIC METABOLIC PANEL WITH GFR
BUN: 14 mg/dL (ref 7–25)
CO2: 25 mmol/L (ref 20–31)
Calcium: 9.4 mg/dL (ref 8.6–10.3)
Chloride: 107 mmol/L (ref 98–110)
Creat: 1.27 mg/dL — ABNORMAL HIGH (ref 0.70–1.18)
GFR, EST AFRICAN AMERICAN: 63 mL/min (ref >=60)
GFR, EST NON AFRICAN AMERICAN: 55 mL/min — AB (ref >=60)
Glucose, Bld: 72 mg/dL (ref 65–99)
POTASSIUM: 4.1 mmol/L (ref 3.5–5.3)
Sodium: 142 mmol/L (ref 135–146)

## 2016-12-30 LAB — CBC WITH DIFFERENTIAL/PLATELET
BASOS ABS: 0 {cells}/uL (ref 0–200)
Basophils Relative: 0 %
EOS ABS: 0 {cells}/uL — AB (ref 15–500)
Eosinophils Relative: 0 %
HEMATOCRIT: 40.8 % (ref 38.5–50.0)
Hemoglobin: 13.5 g/dL (ref 13.2–17.1)
LYMPHS ABS: 17510 {cells}/uL — AB (ref 850–3900)
LYMPHS PCT: 85 %
MCH: 30.2 pg (ref 27.0–33.0)
MCHC: 33.1 g/dL (ref 32.0–36.0)
MCV: 91.3 fL (ref 80.0–100.0)
MONOS PCT: 4 %
MPV: 11.5 fL (ref 7.5–12.5)
Monocytes Absolute: 824 cells/uL (ref 200–950)
NEUTROS ABS: 2266 {cells}/uL (ref 1500–7800)
Neutrophils Relative %: 11 %
PLATELETS: 158 10*3/uL (ref 140–400)
RBC: 4.47 MIL/uL (ref 4.20–5.80)
RDW: 13.8 % (ref 11.0–15.0)
WBC: 20.6 10*3/uL — ABNORMAL HIGH (ref 3.8–10.8)

## 2016-12-30 LAB — TSH: TSH: 0.75 mIU/L (ref 0.40–4.50)

## 2016-12-30 NOTE — Progress Notes (Signed)
MEDICARE ANNUAL WELLNESS VISIT AND FOLLOW UP Assessment:    1. Mixed hyperlipidemia -cont statin -cont diet and exercise -is bowling - Lipid panel  2. Essential hypertension -cont medications -well controlled -dash diet -exercise as tolerated - TSH  3. Prediabetes -cont diet and exercise - Hemoglobin A1c  4. Diverticulosis of intestine without bleeding, unspecified intestinal tract location -followed by  GI and [facility]  5. CLL (chronic lymphocytic leukemia) ([place]) -followed by Dr. Irene Limbo -has OV next week  6. Gastroesophageal reflux disease with esophagitis -cont protonix  7. Vitamin D deficiency -cont Vit D supplement  8. Medication management  - CBC with Differential/Platelet - Hepatic function panel - BASIC METABOLIC PANEL WITH GFR  9. Encounter for Medicare annual wellness exam -due next year      Over 30 minutes of exam, counseling, chart review, and critical decision making was performed  Future Appointments Date Time Provider [place]  01/06/2017 9:00 AM [facility] ONLY [facility] None  01/06/2017 9:30 AM Gautam Juleen China, MD [facility] None  04/03/2017 10:30 AM Unk Pinto, MD [facility] None  11/05/2017 3:00 PM Unk Pinto, MD [facility] None     Plan:   During the course of the visit the patient was educated and counseled about appropriate screening and preventive services including:    Pneumococcal vaccine   Influenza vaccine  Prevnar 13  Td vaccine  Screening electrocardiogram  Colorectal cancer screening  Diabetes screening  Glaucoma screening  Nutrition counseling    Subjective:  Javier [name] is a 76 y.o. male who presents for Medicare Annual Wellness Visit and 3 month follow up for HTN, hyperlipidemia, prediabetes, and vitamin D Def.   His blood pressure has been controlled at home, today their BP is BP: 122/60 He does not workout. He denies chest pain, shortness of breath, dizziness.  He is  on cholesterol medication and denies myalgias. His cholesterol is at goal. The cholesterol last visit was:   Lab Results  Component Value Date   CHOL 175 09/24/2016   HDL 53 09/24/2016   LDLCALC 95 09/24/2016   TRIG 137 09/24/2016   CHOLHDL 3.3 09/24/2016   Lab Results  Component Value Date   HGBA1C 5.2 09/24/2016   Last GFR Lab Results  Component Value Date   GFRNONAA 85 09/24/2016     Lab Results  Component Value Date   GFRAA >89 09/24/2016   Patient is on Vitamin D supplement.   Lab Results  Component Value Date   VD25OH 51 09/24/2016     He reports that he is starting to feel a lot better.  He is gaining weight.  He is back to bowling and he is eating and drinking normally.  He is seeing hematology for CLL.  He is going to see Dr. Irene Limbo for a follow-up.  He reports that he is doing well.     Medication Review: Current Outpatient Prescriptions on File Prior to Visit  Medication Sig Dispense Refill  . ALPRAZolam (XANAX) 0.5 MG tablet TAKE ONE-HALF TABLET BY MOUTH 3 TIMES DAILY BEFORE MEALS AND ONE TABLET DAILY AT BEDTIME 90 tablet 2  . aspirin EC 81 MG tablet Take 81 mg by mouth daily.    . benazepril-hydrochlorthiazide (LOTENSIN HCT) 20-25 MG tablet TAKE 1 TABLET EVERY DAY  FOR  BLOOD  PRESSURE 90 tablet 1  . Cholecalciferol (VITAMIN D3) 5000 units TABS Take 5,000 Units by mouth daily.    . pantoprazole (PROTONIX) 40 MG tablet Take 1 tablet (40 mg total)  by mouth daily. 14 tablet 0  . pravastatin (PRAVACHOL) 40 MG tablet TAKE 1 TABLET AT BEDTIME  FOR  CHOLESTEROL 90 tablet 3   No current facility-administered medications on file prior to visit.     Allergies: Allergies  Allergen Reactions  . Penicillins Other (See Comments)    Unknown allergic reaction as a teenager   . Ppd [Tuberculin Purified Protein Derivative]     Positive test in 2004    Current Problems (verified) has Hyperlipidemia; Hypertension; Prediabetes; Vitamin D deficiency; Diverticulosis;  Medication management; GERD ; Encounter for Medicare annual wellness exam; and CLL (chronic lymphocytic leukemia) ([place]) on his problem list.  Screening Tests Immunization History  Administered Date(s) Administered  . Influenza Split 07/28/2013  . Influenza, High Dose Seasonal PF 09/11/2015, 06/09/2016  . Pneumococcal-Unspecified 07/01/1999, 09/20/2009  . Td 09/20/2009  . Zoster 11/08/2009    Preventative care: Last colonoscopy: 2015  Names of Other Physician/Practitioners you currently use: 1. [facility] here for primary care 2. Dr. Delman Cheadle, eye doctor, last visit 2017 3. Dr. Wynetta Emery , dentist, last visit 2017 Patient Care Team: Unk Pinto, MD as PCP - General (Internal Medicine) Inda Castle, MD as Consulting Physician (Gastroenterology) Melissa Noon, OD as Referring Physician (Optometry) Doran Stabler, MD as Consulting Physician (Gastroenterology)  Surgical: He  has a past surgical history that includes No past surgeries. Family His family history includes Cirrhosis in his brother; Colon cancer in his paternal grandfather; Diabetes in his brother; Early death in his father; Stroke in his mother. Social history  He reports that he quit smoking about 13 years ago. His smoking use included Cigarettes. He has never used smokeless tobacco. He reports that he drinks alcohol. He reports that he does not use drugs.  MEDICARE WELLNESS OBJECTIVES: Physical activity: Current Exercise Habits: The patient does not participate in regular exercise at present Cardiac risk factors: Cardiac Risk Factors include: advanced age (>31men, >29 women);dyslipidemia;hypertension;male gender;microalbuminuria;obesity (BMI >30kg/m2) Depression/mood screen:   Depression screen [facility] 2/9 12/30/2016  Decreased Interest 0  Down, Depressed, Hopeless 0  PHQ - 2 Score 0    ADLs:  In your present state of health, do you have any difficulty performing the following  activities: 12/30/2016 09/24/2016  Hearing? N N  Vision? N N  Difficulty concentrating or making decisions? N N  Walking or climbing stairs? N N  Dressing or bathing? N N  Doing errands, shopping? N N  Preparing Food and eating ? N -  Using the Toilet? N -  In the past six months, have you accidently leaked urine? N -  Do you have problems with loss of bowel control? N -  Managing your Medications? N -  Managing your Finances? N -  Housekeeping or managing your Housekeeping? N -  Some recent data might be hidden     Cognitive Testing  Alert? Yes  Normal Appearance?Yes  Oriented to person? Yes  Place? Yes   Time? Yes  Recall of three objects?  Yes  Can perform simple calculations? Yes  Displays appropriate judgment?Yes  Can read the correct time from a watch face?Yes  EOL planning: Does Patient Have a Medical Advance Directive?: Yes Type of Advance Directive: Healthcare Power of Attorney, Living will Copy of [place] in Chart?: No - copy requested   Objective:   Today's Vitals   12/30/16 1033  BP: 122/60  Pulse: (!) 58  Resp: 16  Temp: 98.2 F (36.8 C)  TempSrc: Temporal  Weight: 173 lb (78.5 kg)  Height: 5\' 11"  (1.803 m)   Body mass index is 24.13 kg/m.  Wt Readings from Last 3 Encounters:  12/30/16 173 lb (78.5 kg)  11/06/16 173 lb 4.8 oz (78.6 kg)  10/21/16 166 lb 9.6 oz (75.6 kg)    General appearance: alert, no distress, WD/WN, male HEENT: normocephalic, sclerae anicteric, TMs pearly, nares patent, no discharge or erythema, pharynx normal Oral cavity: MMM, no lesions Neck: supple, no lymphadenopathy, no thyromegaly, no masses Heart: RRR, normal S1, S2, no murmurs Lungs: CTA bilaterally, no wheezes, rhonchi, or rales Abdomen: +bs, soft, non tender, non distended, no masses, no hepatomegaly, no splenomegaly Musculoskeletal: nontender, no swelling, no obvious deformity Extremities: no edema, no cyanosis, no clubbing Pulses: 2+  symmetric, upper and lower extremities, normal cap refill Neurological: alert, oriented x 3, CN2-12 intact, strength normal upper extremities and lower extremities, sensation normal throughout, DTRs 2+ throughout, no cerebellar signs, gait normal Psychiatric: normal affect, behavior normal, pleasant   Medicare Attestation I have personally reviewed: The patient's medical and social history Their use of alcohol, tobacco or illicit drugs Their current medications and supplements The patient's functional ability including ADLs,fall risks, home safety risks, cognitive, and hearing and visual impairment Diet and physical activities Evidence for depression or mood disorders  The patient's weight, height, BMI, and visual acuity have been recorded in the chart.  I have made referrals, counseling, and provided education to the patient based on review of the above and I have provided the patient with a written personalized care plan for preventive services.     Starlyn Skeans, PA-C   12/30/2016

## 2016-12-31 LAB — HEMOGLOBIN A1C
HEMOGLOBIN A1C: 5.2 % (ref <5.7)
MEAN PLASMA GLUCOSE: 103 mg/dL

## 2017-01-06 ENCOUNTER — Other Ambulatory Visit: Payer: Medicare HMO

## 2017-01-06 ENCOUNTER — Ambulatory Visit: Payer: Medicare HMO | Admitting: Hematology

## 2017-03-12 ENCOUNTER — Telehealth: Payer: Self-pay

## 2017-03-12 NOTE — Telephone Encounter (Signed)
Colonoscopy surveillance is due.The patient was called, he with his wife on the phone line did not wish to proceed with a colonoscopy at this time.  Per pt he has just had a colonoscopy done. No appointments were scheduled during this phone call.

## 2017-03-12 NOTE — Telephone Encounter (Signed)
Got it, thanks!

## 2017-03-16 ENCOUNTER — Other Ambulatory Visit: Payer: Self-pay | Admitting: *Deleted

## 2017-03-16 MED ORDER — ALPRAZOLAM 0.5 MG PO TABS: ORAL_TABLET | ORAL | 1 refills | Status: DC

## 2017-03-16 MED ORDER — BENAZEPRIL-HYDROCHLOROTHIAZIDE 20-25 MG PO TABS: ORAL_TABLET | ORAL | 1 refills | Status: DC

## 2017-03-24 ENCOUNTER — Other Ambulatory Visit: Payer: Self-pay | Admitting: *Deleted

## 2017-03-24 MED ORDER — BENAZEPRIL-HYDROCHLOROTHIAZIDE 20-25 MG PO TABS: ORAL_TABLET | ORAL | 1 refills | Status: DC

## 2017-04-03 ENCOUNTER — Ambulatory Visit: Payer: Self-pay | Admitting: Internal Medicine

## 2017-06-02 ENCOUNTER — Other Ambulatory Visit: Payer: Self-pay | Admitting: Physician Assistant

## 2017-09-09 ENCOUNTER — Other Ambulatory Visit: Payer: Self-pay | Admitting: Internal Medicine

## 2017-09-25 ENCOUNTER — Other Ambulatory Visit: Payer: Self-pay | Admitting: Physician Assistant

## 2017-11-05 ENCOUNTER — Encounter: Payer: Self-pay | Admitting: Internal Medicine

## 2017-11-05 ENCOUNTER — Ambulatory Visit (INDEPENDENT_AMBULATORY_CARE_PROVIDER_SITE_OTHER): Payer: Medicare HMO | Admitting: Internal Medicine

## 2017-11-05 VITALS — BP 126/90 | HR 60 | Temp 97.3°F | Resp 16 | Ht 72.0 in | Wt 179.8 lb

## 2017-11-05 DIAGNOSIS — E559 Vitamin D deficiency, unspecified: Secondary | ICD-10-CM | POA: Diagnosis not present

## 2017-11-05 DIAGNOSIS — K2100 Gastro-esophageal reflux disease with esophagitis, without bleeding: Secondary | ICD-10-CM

## 2017-11-05 DIAGNOSIS — N320 Bladder-neck obstruction: Secondary | ICD-10-CM

## 2017-11-05 DIAGNOSIS — Z79899 Other long term (current) drug therapy: Secondary | ICD-10-CM

## 2017-11-05 DIAGNOSIS — I10 Essential (primary) hypertension: Secondary | ICD-10-CM

## 2017-11-05 DIAGNOSIS — Z8249 Family history of ischemic heart disease and other diseases of the circulatory system: Secondary | ICD-10-CM

## 2017-11-05 DIAGNOSIS — C9110 Chronic lymphocytic leukemia of B-cell type not having achieved remission: Secondary | ICD-10-CM

## 2017-11-05 DIAGNOSIS — E782 Mixed hyperlipidemia: Secondary | ICD-10-CM

## 2017-11-05 DIAGNOSIS — Z1211 Encounter for screening for malignant neoplasm of colon: Secondary | ICD-10-CM

## 2017-11-05 DIAGNOSIS — Z23 Encounter for immunization: Secondary | ICD-10-CM | POA: Diagnosis not present

## 2017-11-05 DIAGNOSIS — Z125 Encounter for screening for malignant neoplasm of prostate: Secondary | ICD-10-CM

## 2017-11-05 DIAGNOSIS — R7303 Prediabetes: Secondary | ICD-10-CM

## 2017-11-05 DIAGNOSIS — R7309 Other abnormal glucose: Secondary | ICD-10-CM

## 2017-11-05 DIAGNOSIS — Z0000 Encounter for general adult medical examination without abnormal findings: Secondary | ICD-10-CM

## 2017-11-05 DIAGNOSIS — Z0001 Encounter for general adult medical examination with abnormal findings: Secondary | ICD-10-CM

## 2017-11-05 DIAGNOSIS — I700 Atherosclerosis of aorta: Secondary | ICD-10-CM

## 2017-11-05 DIAGNOSIS — Z1212 Encounter for screening for malignant neoplasm of rectum: Secondary | ICD-10-CM

## 2017-11-05 DIAGNOSIS — Z136 Encounter for screening for cardiovascular disorders: Secondary | ICD-10-CM | POA: Diagnosis not present

## 2017-11-05 DIAGNOSIS — Z87891 Personal history of nicotine dependence: Secondary | ICD-10-CM

## 2017-11-05 NOTE — Patient Instructions (Addendum)

## 2017-11-05 NOTE — Progress Notes (Signed)
[facility]   Unk Pinto, M.D.     Uvaldo Bristle. Silverio Lay, P.A.-C Liane Comber, [place]                [address] [place] [address] Telephone [phone] Telefax [phone] Annual  Screening/Preventative Visit  & Comprehensive Evaluation & Examination     This very nice 77 y.o. MBM presents for a Screening/Preventative Visit & comprehensive evaluation and management of multiple medical co-morbidities.  Patient has been followed for HTN, Prediabetes, Hyperlipidemia and Vitamin D Deficiency. He's also followed by Dr Irene Limbo for recently dx'd smouldering Stage 0 CLL.      Last year patient had a protracted course of N/V and weight loss  Attributed to H.Pylori and as infection was treated , he recovered and has remained stable. He also had GI consultation at [facility].      HTN predates since 2004. Patient's BP has been controlled at home.  Today's BP is near goal - 126/90. Patient denies any cardiac symptoms as chest pain, palpitations, shortness of breath, dizziness or ankle swelling.     Patient's hyperlipidemia is controlled with diet and medications. Patient denies myalgias or other medication SE's. Last lipids were at goal: Lab Results  Component Value Date   CHOL 165 12/30/2016   HDL 50 12/30/2016   LDLCALC 92 12/30/2016   TRIG 113 12/30/2016   CHOLHDL 3.3 12/30/2016      Patient has prediabetes (A1c 5.8%/2010)  and patient denies reactive hypoglycemic symptoms, visual blurring, diabetic polys or paresthesias. Last A1c was Normal & at goal: Lab Results  Component Value Date   HGBA1C 5.2 12/30/2016       Finally, patient has history of Vitamin D Deficiency and last vitamin D was at goal: Lab Results  Component Value Date   VD25OH 88 09/24/2016   Current Outpatient Medications on File Prior to Visit  Medication Sig  . ALPRAZolam (XANAX) 0.5 MG tablet TAKE ONE-HALF TABLET BY  MOUTH 3 TIMES DAILY BEFORE MEALS AND ONE TABLET DAILY AT BEDTIME  . aspirin EC 81 MG tablet Take 81 mg by mouth daily.  . benazepril-hydrochlorthiazide (LOTENSIN HCT) 20-25 MG tablet TAKE 1 TABLET EVERY DAY FOR BLOOD PRESSURE  . Cholecalciferol (VITAMIN D3) 5000 units TABS Take 5,000 Units by mouth daily.  . pantoprazole (PROTONIX) 40 MG tablet Take 1 tablet (40 mg total) by mouth daily.  . pravastatin (PRAVACHOL) 40 MG tablet TAKE 1 TABLET AT BEDTIME  FOR  CHOLESTEROL   No current facility-administered medications on file prior to visit.    Allergies  Allergen Reactions  . Penicillins Other (See Comments)    Unknown allergic reaction as a teenager   . Ppd [Tuberculin Purified Protein Derivative]     Positive test in 2004   Past Medical History:  Diagnosis Date  . Diverticulosis   . Hyperlipidemia   . Hypertension   . Prediabetes   . Vitamin D deficiency    Health Maintenance  Topic Date Due  . PNA vac Low Risk Adult (2 of 2 - PCV13) 09/20/2010  . INFLUENZA VACCINE  04/29/2017  . COLONOSCOPY  05/22/2017  . TETANUS/TDAP  09/21/2019   Immunization History  Administered Date(s) Administered  . Influenza Split 07/28/2013  . Influenza, High Dose Seasonal PF 09/11/2015, 06/09/2016  . Pneumococcal-Unspecified 07/01/1999, 09/20/2009  . Td 09/20/2009  . Zoster 11/08/2009  Last Colon - 05/22/2014 - Dr Deatra Ina   Past Surgical History:  Procedure Laterality Date  . NO PAST SURGERIES     Family History  Problem Relation Age of Onset  . Stroke Mother   . Early death Father        Farm/tractor accident  . Diabetes Brother   . Cirrhosis Brother   . Colon cancer Paternal Grandfather    Social History   Socioeconomic History  . Marital status: Married    Spouse name: Not on file  . Number of children: 5  . Years of education: Not on file  . Highest education level: Not on file  Occupational History  . Occupation: retired  Tobacco Use  . Smoking status: Former Smoker     Types: Cigarettes    Last attempt to quit: 01/17/2003    Years since quitting: 14.8  . Smokeless tobacco: Never Used  . Tobacco comment: Occasional cigars.  Substance and Sexual Activity  . Alcohol use: Yes, very rare  . Drug use: No  . Sexual activity: Not on file  Other Topics Concern  . Not on file  Social History Narrative  . Not on file    ROS Constitutional: Denies fever, chills, weight loss/gain, headaches, insomnia,  night sweats or change in appetite. Does c/o fatigue. Eyes: Denies redness, blurred vision, diplopia, discharge, itchy or watery eyes.  ENT: Denies discharge, congestion, post nasal drip, epistaxis, sore throat, earache, hearing loss, dental pain, Tinnitus, Vertigo, Sinus pain or snoring.  Cardio: Denies chest pain, palpitations, irregular heartbeat, syncope, dyspnea, diaphoresis, orthopnea, PND, claudication or edema Respiratory: denies cough, dyspnea, DOE, pleurisy, hoarseness, laryngitis or wheezing.  Gastrointestinal: Denies dysphagia, heartburn, reflux, water brash, pain, cramps, nausea, vomiting, bloating, diarrhea, constipation, hematemesis, melena, hematochezia, jaundice or hemorrhoids Genitourinary: Denies dysuria, frequency, urgency, nocturia, hesitancy, discharge, hematuria or flank pain Musculoskeletal: Denies arthralgia, myalgia, stiffness, Jt. Swelling, pain, limp or strain/sprain. Denies Falls. Skin: Denies puritis, rash, hives, warts, acne, eczema or change in skin lesion Neuro: No weakness, tremor, incoordination, spasms, paresthesia or pain Psychiatric: Denies confusion, memory loss or sensory loss. Denies Depression. Endocrine: Denies change in weight, skin, hair change, nocturia, and paresthesia, diabetic polys, visual blurring or hyper / hypo glycemic episodes.  Heme/Lymph: No excessive bleeding, bruising or enlarged lymph nodes.  Physical Exam  BP 126/90   Pulse 60   Temp (!) 97.3 F (36.3 C)   Resp 16   Ht 6' (1.829 m)   Wt 179 lb 12.8  oz (81.6 kg)   BMI 24.39 kg/m   General Appearance: Well nourished and well groomed and in no apparent distress.  Eyes: PERRLA, EOMs, conjunctiva no swelling or erythema, normal fundi and vessels. Sinuses: No frontal/maxillary tenderness ENT/Mouth: EACs patent / TMs  nl. Nares clear without erythema, swelling, mucoid exudates. Oral hygiene is good. No erythema, swelling, or exudate. Tongue normal, non-obstructing. Tonsils not swollen or erythematous. Hearing normal.  Neck: Supple, thyroid normal. No bruits, nodes or JVD. Respiratory: Respiratory effort normal.  BS equal and clear bilateral without rales, rhonci, wheezing or stridor. Cardio: Heart sounds are normal with regular rate and rhythm and no murmurs, rubs or gallops. Peripheral pulses are normal and equal bilaterally without edema. No aortic or femoral bruits. Chest: symmetric with normal excursions and percussion.  Abdomen: Soft, with Nl bowel sounds. Nontender, no guarding, rebound, hernias, masses, or organomegaly.  Lymphatics: Non tender without lymphadenopathy.  Genitourinary: No hernias.Testes nl. DRE - prostate nl for age - smooth & firm w/o  nodules. Musculoskeletal: Full ROM all peripheral extremities, joint stability, 5/5 strength, and normal gait. Skin: Warm and dry without rashes, lesions, cyanosis, clubbing or  ecchymosis.  Neuro: Cranial nerves intact, reflexes equal bilaterally. Normal muscle tone, no cerebellar symptoms. Sensation intact.  Pysch: Alert and oriented X 3 with normal affect, insight and judgment appropriate.   Assessment and Plan  1. Annual Preventative/Screening Exam   2. Essential hypertension  - EKG 12-Lead - [place], RETROPERITNL ABD,  LTD - Urinalysis, Routine w reflex microscopic - Microalbumin / creatinine urine ratio - CBC with Differential/Platelet - BASIC METABOLIC PANEL WITH GFR - Magnesium - TSH  3. Hyperlipidemia, mixed  - EKG 12-Lead - [place], RETROPERITNL ABD,  LTD - Hepatic function  panel - Lipid panel - TSH  4. Prediabetes  - EKG 12-Lead - [place], RETROPERITNL ABD,  LTD - Hemoglobin A1c - Insulin, random  5. Vitamin D deficiency  - VITAMIN D 25 Hydroxy  6. Abnormal glucose  - Hemoglobin A1c - Insulin, random  7. Gastroesophageal reflux disease with esophagitis  - CBC with Differential/Platelet  8. CLL (chronic lymphocytic leukemia) ([facility])  - CBC with Differential/Platelet  9. Screening for colorectal cancer  - POC Hemoccult Bld/Stl  10. Prostate cancer screening  - PSA  11. Bladder outlet obstruction  - PSA  12. Screening for ischemic heart disease  - EKG 12-Lead  13. Former smoker  - EKG 12-Lead - [place], RETROPERITNL ABD,  LTD  14. FH: cardiovascular disease  - EKG 12-Lead - [place], RETROPERITNL ABD,  LTD  15. Screening for AAA (aortic abdominal aneurysm)  - [place], RETROPERITNL ABD,  LTD  16. Aortic atherosclerosis ([facility])  - [place], RETROPERITNL ABD,  LTD  17. Medication management  - Urinalysis, Routine w reflex microscopic - Microalbumin / creatinine urine ratio - BASIC METABOLIC PANEL WITH GFR - Hepatic function panel - Magnesium - Lipid panel - TSH - Hemoglobin A1c - Insulin, random - VITAMIN D 25 Hydroxy   18. Need for prophylactic vaccination against Streptococcus pneumoniae (pneumococcus)  - Pneumococcal conjugate vaccine 13-valent           Patient was counseled in prudent diet, weight control to achieve/maintain BMI less than 25, BP monitoring, regular exercise and medications as discussed.  Discussed med effects and SE's. Routine screening labs and tests as requested with regular follow-up as recommended. Over 40 minutes of exam, counseling, chart review and high complex critical decision making was performed

## 2017-11-06 LAB — URINALYSIS, ROUTINE W REFLEX MICROSCOPIC
BILIRUBIN URINE: NEGATIVE
GLUCOSE, UA: NEGATIVE
HGB URINE DIPSTICK: NEGATIVE
KETONES UR: NEGATIVE
Leukocytes, UA: NEGATIVE
Nitrite: NEGATIVE
PH: 6.5 (ref 5.0–8.0)
Protein, ur: NEGATIVE
Specific Gravity, Urine: 1.016 (ref 1.001–1.03)

## 2017-11-06 LAB — CBC WITH DIFFERENTIAL/PLATELET
BASOS PCT: 0.1 %
Basophils Absolute: 40 cells/uL (ref 0–200)
EOS ABS: 40 {cells}/uL (ref 15–500)
Eosinophils Relative: 0.1 %
HCT: 41.3 % (ref 38.5–50.0)
Hemoglobin: 13.6 g/dL (ref 13.2–17.1)
Lymphs Abs: 35985 cells/uL — ABNORMAL HIGH (ref 850–3900)
MCH: 29.2 pg (ref 27.0–33.0)
MCHC: 32.9 g/dL (ref 32.0–36.0)
MCV: 88.6 fL (ref 80.0–100.0)
MONOS PCT: 1.7 %
MPV: 12.3 fL (ref 7.5–12.5)
NEUTROS ABS: 2765 {cells}/uL (ref 1500–7800)
Neutrophils Relative %: 7 %
PLATELETS: 148 10*3/uL (ref 140–400)
RBC: 4.66 10*6/uL (ref 4.20–5.80)
RDW: 12.2 % (ref 11.0–15.0)
Total Lymphocyte: 91.1 %
WBC: 39.5 10*3/uL — ABNORMAL HIGH (ref 3.8–10.8)
WBCMIX: 672 {cells}/uL (ref 200–950)

## 2017-11-06 LAB — HEPATIC FUNCTION PANEL
AG Ratio: 1.8 (calc) (ref 1.0–2.5)
ALBUMIN MSPROF: 4.5 g/dL (ref 3.6–5.1)
ALT: 11 U/L (ref 9–46)
AST: 15 U/L (ref 10–35)
Alkaline phosphatase (APISO): 56 U/L (ref 40–115)
BILIRUBIN DIRECT: 0.1 mg/dL (ref 0.0–0.2)
GLOBULIN: 2.5 g/dL (ref 1.9–3.7)
Indirect Bilirubin: 0.4 mg/dL (calc) (ref 0.2–1.2)
Total Bilirubin: 0.5 mg/dL (ref 0.2–1.2)
Total Protein: 7 g/dL (ref 6.1–8.1)

## 2017-11-06 LAB — BASIC METABOLIC PANEL WITH GFR
BUN: 16 mg/dL (ref 7–25)
CALCIUM: 10 mg/dL (ref 8.6–10.3)
CHLORIDE: 106 mmol/L (ref 98–110)
CO2: 28 mmol/L (ref 20–32)
Creat: 1.05 mg/dL (ref 0.70–1.18)
GFR, EST AFRICAN AMERICAN: 80 mL/min/{1.73_m2} (ref >=60)
GFR, Est Non African American: 69 mL/min/{1.73_m2} (ref >=60)
Glucose, Bld: 87 mg/dL (ref 65–99)
Potassium: 4.2 mmol/L (ref 3.5–5.3)
Sodium: 142 mmol/L (ref 135–146)

## 2017-11-06 LAB — VITAMIN D 25 HYDROXY (VIT D DEFICIENCY, FRACTURES): VIT D 25 HYDROXY: 75 ng/mL (ref 30–100)

## 2017-11-06 LAB — MAGNESIUM: MAGNESIUM: 2.2 mg/dL (ref 1.5–2.5)

## 2017-11-06 LAB — LIPID PANEL
Cholesterol: 171 mg/dL (ref <200)
HDL: 46 mg/dL (ref >40)
LDL CHOLESTEROL (CALC): 107 mg/dL — AB
Non-HDL Cholesterol (Calc): 125 mg/dL (calc) (ref <130)
Total CHOL/HDL Ratio: 3.7 (calc) (ref <5.0)
Triglycerides: 90 mg/dL (ref <150)

## 2017-11-06 LAB — MICROALBUMIN / CREATININE URINE RATIO
Creatinine, Urine: 105 mg/dL (ref 20–320)
MICROALB/CREAT RATIO: 6 ug/mg{creat} (ref <30)
Microalb, Ur: 0.6 mg/dL

## 2017-11-06 LAB — TSH: TSH: 1.12 mIU/L (ref 0.40–4.50)

## 2017-11-06 LAB — HEMOGLOBIN A1C
Hgb A1c MFr Bld: 5.5 % of total Hgb (ref <5.7)
Mean Plasma Glucose: 111 (calc)
eAG (mmol/L): 6.2 (calc)

## 2017-11-06 LAB — PSA: PSA: 2 ng/mL (ref <=4.0)

## 2017-11-06 LAB — INSULIN, RANDOM: INSULIN: 3.9 u[IU]/mL (ref 2.0–19.6)

## 2018-02-08 DIAGNOSIS — F419 Anxiety disorder, unspecified: Secondary | ICD-10-CM | POA: Insufficient documentation

## 2018-02-08 NOTE — Progress Notes (Deleted)
MEDICARE ANNUAL WELLNESS VISIT AND FOLLOW UP Assessment:   Diagnoses and all orders for this visit:  Encounter for Medicare annual wellness exam  Essential hypertension Continue medication Monitor blood pressure at home; call if consistently over 130/80 Continue DASH diet.   Reminder to go to the ER if any CP, SOB, nausea, dizziness, severe HA, changes vision/speech, left arm numbness and tingling and jaw pain.  Gastroesophageal reflux disease with esophagitis Well managed on current medications Discussed diet, avoiding triggers and other lifestyle changes  Diverticulosis Overdue for follow up with Dr. Deatra Ina  Vitamin D deficiency At goal at recent check; continue to recommend supplementation for goal of 70-100 Defer vitamin D level  Prediabetes Recent A1Cs at goal Discussed diet/exercise, weight management  Defer A1C; check BMP  Medication management CBC, CMP/GFR, magnesium  Mixed hyperlipidemia Continue medications: pravastatin Continue low cholesterol diet and exercise.  Check lipid panel.   CLL (chronic lymphocytic leukemia) (HCC) CBC, followed by Dr. Irene Limbo  Anxiety Well managed by current regimen; continue medications Stress management techniques discussed, increase water, good sleep hygiene discussed, increase exercise, and increase veggies.     Over 30 minutes of exam, counseling, chart review, and critical decision making was performed  Future Appointments  Date Time Provider [place]  02/09/2018  3:45 PM Liane Comber, NP [facility] None  05/18/2018  3:30 PM Unk Pinto, MD [facility] None  12/13/2018  3:00 PM Unk Pinto, MD [facility] None     Plan:   During the course of the visit the patient was educated and counseled about appropriate screening and preventive services including:    Pneumococcal vaccine   Influenza vaccine  Prevnar 13  Td vaccine  Screening electrocardiogram  Colorectal cancer screening  Diabetes  screening  Glaucoma screening  Nutrition counseling    Subjective:  Javier [name] is a 77 y.o. male who presents for Medicare Annual Wellness Visit and 3 month follow up for HTN, hyperlipidemia, glucose management, and vitamin D Def.   He's also followed by Dr Irene Limbo for recently diagnosed smouldering Stage 0 CLL.   he has a diagnosis of anxiety and is currently on xanax 0.25 mg QID PRN, reports symptoms are*** well controlled on current regimen. he ***  BMI is There is no height or weight on file to calculate BMI., he {HAS HAS JSE:83151} been working on diet and exercise. Wt Readings from Last 3 Encounters:  11/05/17 179 lb 12.8 oz (81.6 kg)  12/30/16 173 lb (78.5 kg)  11/06/16 173 lb 4.8 oz (78.6 kg)   His blood pressure {HAS HAS NOT:18834} been controlled at home, today their BP is   He {DOES_DOES VOH:60737} workout. He denies chest pain, shortness of breath, dizziness.   He is on cholesterol medication (pravastatin 40 mg daily ***) and denies myalgias. His cholesterol is not at goal. The cholesterol last visit was:   Lab Results  Component Value Date   CHOL 171 11/05/2017   HDL 46 11/05/2017   LDLCALC 107 (H) 11/05/2017   TRIG 90 11/05/2017   CHOLHDL 3.7 11/05/2017   He {Has/has not:18111} been working on diet and exercise for glucose management, and denies {Symptoms; diabetes w/o none:19199}. Last A1C in the office was:  Lab Results  Component Value Date   HGBA1C 5.5 11/05/2017   Last GFR Lab Results  Component Value Date   GFRNONAA 69 11/05/2017   Patient is on Vitamin D supplement.   Lab Results  Component Value Date   VD25OH 75 11/05/2017  Medication Review:   Current Outpatient Medications (Cardiovascular):  .  benazepril-hydrochlorthiazide (LOTENSIN HCT) 20-25 MG tablet, TAKE 1 TABLET EVERY DAY FOR BLOOD PRESSURE .  pravastatin (PRAVACHOL) 40 MG tablet, TAKE 1 TABLET AT BEDTIME  FOR  CHOLESTEROL   Current Outpatient Medications (Analgesics):  .   aspirin EC 81 MG tablet, Take 81 mg by mouth daily.   Current Outpatient Medications (Other):  Marland Kitchen  ALPRAZolam (XANAX) 0.5 MG tablet, TAKE ONE-HALF TABLET BY MOUTH 3 TIMES DAILY BEFORE MEALS AND ONE TABLET DAILY AT BEDTIME .  Cholecalciferol (VITAMIN D3) 5000 units TABS, Take 5,000 Units by mouth daily. .  pantoprazole (PROTONIX) 40 MG tablet, Take 1 tablet (40 mg total) by mouth daily.  Allergies: Allergies  Allergen Reactions  . Penicillins Other (See Comments)    Unknown allergic reaction as a teenager   . 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  Screening Tests Immunization History  Administered Date(s) Administered  . Influenza Split 07/28/2013  . Influenza, High Dose Seasonal PF 09/11/2015, 06/09/2016  . Pneumococcal Conjugate-13 11/05/2017  . Pneumococcal-Unspecified 07/01/1999, 09/20/2009  . Td 09/20/2009  . Zoster 11/08/2009   Preventative care: Last colonoscopy: 2015 was due in 2018 ***  Prior vaccinations: TD or Tdap: 2010  Influenza: 2017 Pneumococcal: 2010 Prevnar13: 2019 Shingles/Zostavax: 2011  Names of Other Physician/Practitioners you currently use: 1. [facility] here for primary care 2. Dr. Delman Cheadle, eye doctor, last visit 2017 3. Dr. Wynetta Emery , dentist, last visit 2017  Patient Care Team: Unk Pinto, MD as PCP - General (Internal Medicine) Inda Castle, MD (Inactive) as Consulting Physician (Gastroenterology) Melissa Noon, [place] as Referring Physician (Optometry) Danis, Kirke Corin, MD as Consulting Physician (Gastroenterology) Brunetta Genera, MD as Consulting Physician (Hematology)  Surgical: He  has a  past surgical history that includes No past surgeries. Family His family history includes Cirrhosis in his brother; Colon cancer in his paternal grandfather; Diabetes in his brother; Early death in his father; Stroke in his mother. Social history  He reports that he quit smoking about 15 years ago. His smoking use included cigarettes. He has never used smokeless tobacco. He reports that he drinks alcohol. He reports that he does not use drugs.  MEDICARE WELLNESS OBJECTIVES: Physical activity:   Cardiac risk factors:   Depression/mood screen:   Depression screen [facility] 2/9 11/08/2017  Decreased Interest 0  Down, Depressed, Hopeless 0  PHQ - 2 Score 0    ADLs:  In your present state of health, do you have any difficulty performing the following activities: 11/08/2017  Hearing? N  Vision? N  Difficulty concentrating or making decisions? N  Walking or climbing stairs? N  Dressing or bathing? N  Doing errands, shopping? N  Some recent data might be hidden     Cognitive Testing  Alert? Yes  Normal Appearance?Yes  Oriented to person? Yes  Place? Yes   Time? Yes  Recall of three objects?  Yes  Can perform simple calculations? Yes  Displays appropriate judgment?Yes  Can read the correct time from a watch face?Yes  EOL planning:     Objective:   There were no vitals filed for this visit. There is no height or weight on file to calculate BMI.  General appearance: alert, no distress, WD/WN, male HEENT: normocephalic, sclerae anicteric, TMs pearly, nares patent, no discharge or erythema, pharynx normal Oral cavity: MMM, no lesions Neck: supple, no lymphadenopathy, no thyromegaly, no masses Heart: RRR, normal S1, S2, no murmurs Lungs: CTA bilaterally, no wheezes, rhonchi, or rales Abdomen: +bs, soft, non tender, non distended, no masses, no hepatomegaly, no splenomegaly Musculoskeletal: nontender, no swelling, no obvious deformity Extremities: no edema, no cyanosis, no  clubbing Pulses: 2+ symmetric, upper and lower extremities, normal cap refill Neurological: alert, oriented x 3, CN2-12 intact, strength normal upper extremities and lower extremities, sensation normal throughout, DTRs 2+ throughout, no cerebellar signs, gait normal Psychiatric: normal affect, behavior normal, pleasant   Medicare Attestation I have personally reviewed: The patient's medical and social history Their use of alcohol, tobacco or illicit drugs Their current medications and supplements The patient's functional ability including ADLs,fall risks, home safety risks, cognitive, and hearing and visual impairment Diet and physical activities Evidence for depression or mood disorders  The patient's weight, height, BMI, and visual acuity have been recorded in the chart.  I have made referrals, counseling, and provided education to the patient based on review of the above and I have provided the patient with a written personalized care plan for preventive services.     Izora Ribas, NP   02/08/2018

## 2018-02-09 ENCOUNTER — Ambulatory Visit: Payer: Self-pay | Admitting: Adult Health

## 2018-03-01 DIAGNOSIS — Z6824 Body mass index (BMI) 24.0-24.9, adult: Secondary | ICD-10-CM | POA: Insufficient documentation

## 2018-03-01 NOTE — Progress Notes (Signed)
MEDICARE ANNUAL WELLNESS VISIT AND FOLLOW UP Assessment:   Diagnoses and all orders for this visit:  Encounter for Medicare annual wellness exam  Essential hypertension Continue medication Monitor blood pressure at home; call if consistently over 130/80 Continue DASH diet.   Reminder to go to the ER if any CP, SOB, nausea, dizziness, severe HA, changes vision/speech, left arm numbness and tingling and jaw pain.  Gastroesophageal reflux disease with esophagitis Well managed on current medications Discussed diet, avoiding triggers and other lifestyle changes  Diverticulosis Overdue for follow up with Dr. Deatra Ina  Vitamin D deficiency At goal at recent check; continue to recommend supplementation for goal of 70-100 Defer vitamin D level  Prediabetes Recent A1Cs at goal Discussed diet/exercise, weight management  Defer A1C; check BMP  Medication management CBC, CMP/GFR, magnesium  Mixed hyperlipidemia Continue medications: pravastatin Continue low cholesterol diet and exercise.  Check lipid panel.   CLL (chronic lymphocytic leukemia) (HCC) CBC, followed by Dr. Irene Limbo  Anxiety Well managed by current regimen; continue medications Stress management techniques discussed, increase water, good sleep hygiene discussed, increase exercise, and increase veggies.     Over 30 minutes of exam, counseling, chart review, and critical decision making was performed  Future Appointments  Date Time Provider [place]  05/18/2018  3:30 PM Unk Pinto, MD [facility] None  12/13/2018  3:00 PM Unk Pinto, MD [facility] None     Plan:   During the course of the visit the patient was educated and counseled about appropriate screening and preventive services including:    Pneumococcal vaccine   Influenza vaccine  Prevnar 13  Td vaccine  Screening electrocardiogram  Colorectal cancer screening  Diabetes screening  Glaucoma screening  Nutrition counseling     Subjective:  Javier [name] is a 77 y.o. male who presents for Medicare Annual Wellness Visit and 3 month follow up for HTN, hyperlipidemia, glucose management, and vitamin D Def. He's also followed by Dr Irene Limbo for recently diagnosed smouldering Stage 0 CLL.   he has a diagnosis of anxiety and is currently on xanax 0.25 mg QID PRN, reports symptoms are well controlled on current regimen. he reports symptoms are improved, hasn't used in over a month.   BMI is Body mass index is 25.04 kg/m., he has been working on diet but doesn't currently regularly exercise.  Wt Readings from Last 3 Encounters:  03/03/18 184 lb 9.6 oz (83.7 kg)  11/05/17 179 lb 12.8 oz (81.6 kg)  12/30/16 173 lb (78.5 kg)   His blood pressure has been controlled at home, today their BP is BP: 140/84 He does not workout. He denies chest pain, shortness of breath, dizziness.   He is on cholesterol medication (pravastatin 40 mg daily) and denies myalgias. His cholesterol is not at goal. The cholesterol last visit was:   Lab Results  Component Value Date   CHOL 171 11/05/2017   HDL 46 11/05/2017   LDLCALC 107 (H) 11/05/2017   TRIG 90 11/05/2017   CHOLHDL 3.7 11/05/2017   He has been working on diet and exercise for glucose management, and denies foot ulcerations, increased appetite, nausea, paresthesia of the feet, polydipsia, polyuria, visual disturbances, vomiting and weight loss. Last A1C in the office was:  Lab Results  Component Value Date   HGBA1C 5.5 11/05/2017   Last GFR Lab Results  Component Value Date   GFRNONAA 69 11/05/2017   Patient is on Vitamin D supplement.   Lab Results  Component Value Date   VD25OH 63  11/05/2017      Medication Review:   Current Outpatient Medications (Cardiovascular):  .  benazepril-hydrochlorthiazide (LOTENSIN HCT) 20-25 MG tablet, TAKE 1 TABLET EVERY DAY FOR BLOOD PRESSURE .  pravastatin (PRAVACHOL) 40 MG tablet, TAKE 1 TABLET AT BEDTIME  FOR   CHOLESTEROL   Current Outpatient Medications (Analgesics):  .  aspirin EC 81 MG tablet, Take 81 mg by mouth daily.   Current Outpatient Medications (Other):  Marland Kitchen  ALPRAZolam (XANAX) 0.5 MG tablet, TAKE ONE-HALF TABLET BY MOUTH 3 TIMES DAILY BEFORE MEALS AND ONE TABLET DAILY AT BEDTIME .  Cholecalciferol (VITAMIN D3) 5000 units TABS, Take 5,000 Units by mouth daily. .  pantoprazole (PROTONIX) 40 MG tablet, Take 1 tablet (40 mg total) by mouth daily.  Allergies: Allergies  Allergen Reactions  . Penicillins Other (See Comments)    Unknown allergic reaction as a teenager   . 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  Screening Tests Immunization History  Administered Date(s) Administered  . Influenza Split 07/28/2013  . Influenza, High Dose Seasonal PF 09/11/2015, 06/09/2016  . Pneumococcal Conjugate-13 11/05/2017  . Pneumococcal-Unspecified 07/01/1999, 09/20/2009  . Td 09/20/2009  . Zoster 11/08/2009   Preventative care: Last colonoscopy: 2015 was due in 2018   Prior vaccinations: TD or Tdap: 2010  Influenza: 2017 Pneumococcal: 2010 Prevnar13: 2019 Shingles/Zostavax: 2011  Names of Other Physician/Practitioners you currently use: 1.  [facility] for [facility] 2. Dr. Delman Cheadle, eye doctor, last visit 2017 3. Dr. Wynetta Emery , dentist, last visit 2017  Patient Care Team: Unk Pinto, MD as PCP - General (Internal Medicine) Inda Castle, MD (Inactive) as Consulting Physician (Gastroenterology) Melissa Noon, [place] as Referring Physician (Optometry) Danis, Kirke Corin, MD as Consulting Physician  (Gastroenterology) Brunetta Genera, MD as Consulting Physician (Hematology)  Surgical: He  has a past surgical history that includes No past surgeries. Family His family history includes Cirrhosis in his brother; Colon cancer in his paternal grandfather; Diabetes in his brother; Early death in his father; Stroke in his mother. Social history  He reports that he quit smoking about 15 years ago. His smoking use included cigarettes. He has never used smokeless tobacco. He reports that he drinks alcohol. He reports that he does not use drugs.  MEDICARE WELLNESS OBJECTIVES: Physical activity: Current Exercise Habits: The patient does not participate in regular exercise at present, Exercise limited by: None identified Cardiac risk factors: Cardiac Risk Factors include: advanced age (>77men, >20 women);dyslipidemia;hypertension;male gender;smoking/ tobacco exposure Depression/mood screen:   Depression screen [facility] 2/9 03/03/2018  Decreased Interest 0  Down, Depressed, Hopeless 0  PHQ - 2 Score 0    ADLs:  In your present state of health, do you have any difficulty performing the following activities: 03/03/2018 11/08/2017  Hearing? N N  Vision? N N  Difficulty concentrating or making decisions? N N  Walking or climbing stairs? N N  Dressing or bathing? N N  Doing errands, shopping? N N  Some recent data might be hidden     Cognitive Testing  Alert? Yes  Normal Appearance?Yes  Oriented to person? Yes  Place? Yes   Time? Yes  Recall of three objects?  Yes  Can perform simple calculations? Yes  Displays appropriate judgment?Yes  Can read the correct time from a watch face?Yes  EOL planning: Does Patient Have a Medical Advance Directive?: No Would patient like information on creating a medical advance directive?: No - Patient declined   Objective:   Today's Vitals   03/03/18 0957  BP: 140/84  Pulse: 63  Temp: (!) 97.5 F (36.4 C)  SpO2: 97%  Weight: 184 lb 9.6 oz (83.7 kg)   Height: 6' (1.829 m)   Body mass index is 25.04 kg/m.  General appearance: alert, no distress, WD/WN, male HEENT: normocephalic, sclerae anicteric, TMs pearly, nares patent, no discharge or erythema, pharynx normal Oral cavity: MMM, no lesions Neck: supple, no lymphadenopathy, no thyromegaly, no masses Heart: RRR, normal S1, S2, no murmurs Lungs: CTA bilaterally, no wheezes, rhonchi, or rales Abdomen: +bs, soft, non tender, non distended, no masses, no hepatomegaly, no splenomegaly Musculoskeletal: nontender, no swelling, no obvious deformity Extremities: no edema, no cyanosis, no clubbing Pulses: 2+ symmetric, upper and lower extremities, normal cap refill Neurological: alert, oriented x 3, CN2-12 intact, strength normal upper extremities and lower extremities, sensation normal throughout, DTRs 2+ throughout, no cerebellar signs, gait normal Psychiatric: normal affect, behavior normal, pleasant   Medicare Attestation I have personally reviewed: The patient's medical and social history Their use of alcohol, tobacco or illicit drugs Their current medications and supplements The patient's functional ability including ADLs,fall risks, home safety risks, cognitive, and hearing and visual impairment Diet and physical activities Evidence for depression or mood disorders  The patient's weight, height, BMI, and visual acuity have been recorded in the chart.  I have made referrals, counseling, and provided education to the patient based on review of the above and I have provided the patient with a written personalized care plan for preventive services.     Izora Ribas, NP   03/03/2018

## 2018-03-03 ENCOUNTER — Encounter: Payer: Self-pay | Admitting: Adult Health

## 2018-03-03 ENCOUNTER — Ambulatory Visit (INDEPENDENT_AMBULATORY_CARE_PROVIDER_SITE_OTHER): Payer: Medicare HMO | Admitting: Adult Health

## 2018-03-03 VITALS — BP 140/84 | HR 63 | Temp 97.5°F | Ht 72.0 in | Wt 184.6 lb

## 2018-03-03 DIAGNOSIS — E782 Mixed hyperlipidemia: Secondary | ICD-10-CM | POA: Diagnosis not present

## 2018-03-03 DIAGNOSIS — Z6824 Body mass index (BMI) 24.0-24.9, adult: Secondary | ICD-10-CM | POA: Diagnosis not present

## 2018-03-03 DIAGNOSIS — R7309 Other abnormal glucose: Secondary | ICD-10-CM | POA: Diagnosis not present

## 2018-03-03 DIAGNOSIS — C9190 Lymphoid leukemia, unspecified not having achieved remission: Secondary | ICD-10-CM

## 2018-03-03 DIAGNOSIS — D369 Benign neoplasm, unspecified site: Secondary | ICD-10-CM

## 2018-03-03 DIAGNOSIS — K2100 Gastro-esophageal reflux disease with esophagitis, without bleeding: Secondary | ICD-10-CM

## 2018-03-03 DIAGNOSIS — R6889 Other general symptoms and signs: Secondary | ICD-10-CM | POA: Diagnosis not present

## 2018-03-03 DIAGNOSIS — C9110 Chronic lymphocytic leukemia of B-cell type not having achieved remission: Secondary | ICD-10-CM

## 2018-03-03 DIAGNOSIS — Z0000 Encounter for general adult medical examination without abnormal findings: Secondary | ICD-10-CM

## 2018-03-03 DIAGNOSIS — K5790 Diverticulosis of intestine, part unspecified, without perforation or abscess without bleeding: Secondary | ICD-10-CM

## 2018-03-03 DIAGNOSIS — E559 Vitamin D deficiency, unspecified: Secondary | ICD-10-CM | POA: Diagnosis not present

## 2018-03-03 DIAGNOSIS — Z0001 Encounter for general adult medical examination with abnormal findings: Secondary | ICD-10-CM | POA: Diagnosis not present

## 2018-03-03 DIAGNOSIS — F419 Anxiety disorder, unspecified: Secondary | ICD-10-CM | POA: Diagnosis not present

## 2018-03-03 DIAGNOSIS — I10 Essential (primary) hypertension: Secondary | ICD-10-CM | POA: Diagnosis not present

## 2018-03-03 DIAGNOSIS — Z79899 Other long term (current) drug therapy: Secondary | ICD-10-CM | POA: Diagnosis not present

## 2018-03-03 NOTE — Patient Instructions (Signed)
Aim for 7+ servings of fruits and vegetables daily  80+ fluid ounces of water or unsweet tea for healthy kidneys  Limit alcohol intake  Limit animal fats in diet for cholesterol and heart health - choose grass fed whenever available  Aim for low stress - take time to unwind and care for your mental health  Aim for 150 min of moderate intensity exercise weekly for heart health, and weights twice weekly for bone health  Aim for 7-9 hours of sleep daily    Exercising to Stay Healthy Exercising regularly is important. It has many health benefits, such as:  Improving your overall fitness, flexibility, and endurance.  Increasing your bone density.  Helping with weight control.  Decreasing your body fat.  Increasing your muscle strength.  Reducing stress and tension.  Improving your overall health.  In order to become healthy and stay healthy, it is recommended that you do moderate-intensity and vigorous-intensity exercise. You can tell that you are exercising at a moderate intensity if you have a higher heart rate and faster breathing, but you are still able to hold a conversation. You can tell that you are exercising at a vigorous intensity if you are breathing much harder and faster and cannot hold a conversation while exercising. How often should I exercise? Choose an activity that you enjoy and set realistic goals. Your health care provider can help you to make an activity plan that works for you. Exercise regularly as directed by your health care provider. This may include:  Doing resistance training twice each week, such as: ? Push-ups. ? Sit-ups. ? Lifting weights. ? Using resistance bands.  Doing a given intensity of exercise for a given amount of time. Choose from these options: ? 150 minutes of moderate-intensity exercise every week. ? 75 minutes of vigorous-intensity exercise every week. ? A mix of moderate-intensity and vigorous-intensity exercise every  week.  Children, pregnant women, people who are out of shape, people who are overweight, and older adults may need to consult a health care provider for individual recommendations. If you have any sort of medical condition, be sure to consult your health care provider before starting a new exercise program. What are some exercise ideas? Some moderate-intensity exercise ideas include:  Walking at a rate of 1 mile in 15 minutes.  Biking.  Hiking.  Golfing.  Dancing.  Some vigorous-intensity exercise ideas include:  Walking at a rate of at least 4.5 miles per hour.  Jogging or running at a rate of 5 miles per hour.  Biking at a rate of at least 10 miles per hour.  Lap swimming.  Roller-skating or in-line skating.  Cross-country skiing.  Vigorous competitive sports, such as football, basketball, and soccer.  Jumping rope.  Aerobic dancing.  What are some everyday activities that can help me to get exercise?  [place] work, such as: ? Pushing a Conservation officer, nature. ? Raking and bagging leaves.  Washing and waxing your car.  Pushing a stroller.  Shoveling snow.  Gardening.  Washing windows or floors. How can I be more active in my day-to-day activities?  Use the stairs instead of the elevator.  Take a walk during your lunch break.  If you drive, park your car farther away from work or school.  If you take public transportation, get off one stop early and walk the rest of the way.  Make all of your phone calls while standing up and walking around.  Get up, stretch, and walk around every 30 minutes  throughout the day. What guidelines should I follow while exercising?  Do not exercise so much that you hurt yourself, feel dizzy, or get very short of breath.  Consult your health care provider before starting a new exercise program.  Wear comfortable clothes and shoes with good support.  Drink plenty of water while you exercise to prevent dehydration or heat stroke.  Body water is lost during exercise and must be replaced.  Work out until you breathe faster and your heart beats faster. This information is not intended to replace advice given to you by your health care provider. Make sure you discuss any questions you have with your health care provider. Document Released: 10/18/2010 Document Revised: 02/21/2016 Document Reviewed: 02/16/2014 Elsevier Interactive Patient Education  [employer].

## 2018-03-04 LAB — CBC WITH DIFFERENTIAL/PLATELET
BASOS ABS: 135 {cells}/uL (ref 0–200)
Basophils Relative: 0.3 %
EOS PCT: 0.2 %
Eosinophils Absolute: 90 cells/uL (ref 15–500)
HEMATOCRIT: 40.2 % (ref 38.5–50.0)
Hemoglobin: 13.2 g/dL (ref 13.2–17.1)
Lymphs Abs: 40994 cells/uL — ABNORMAL HIGH (ref 850–3900)
MCH: 29.4 pg (ref 27.0–33.0)
MCHC: 32.8 g/dL (ref 32.0–36.0)
MCV: 89.5 fL (ref 80.0–100.0)
MPV: 12.3 fL (ref 7.5–12.5)
Monocytes Relative: 1.7 %
NEUTROS PCT: 6.5 %
Neutro Abs: 2919 cells/uL (ref 1500–7800)
Platelets: 169 10*3/uL (ref 140–400)
RBC: 4.49 10*6/uL (ref 4.20–5.80)
RDW: 12.8 % (ref 11.0–15.0)
TOTAL LYMPHOCYTE: 91.3 %
WBC mixed population: 763 cells/uL (ref 200–950)
WBC: 44.9 10*3/uL — ABNORMAL HIGH (ref 3.8–10.8)

## 2018-03-04 LAB — COMPLETE METABOLIC PANEL WITH GFR
AG RATIO: 1.8 (calc) (ref 1.0–2.5)
ALT: 12 U/L (ref 9–46)
AST: 16 U/L (ref 10–35)
Albumin: 4.3 g/dL (ref 3.6–5.1)
Alkaline phosphatase (APISO): 56 U/L (ref 40–115)
BUN/Creatinine Ratio: 14 (calc) (ref 6–22)
BUN: 17 mg/dL (ref 7–25)
CALCIUM: 9.7 mg/dL (ref 8.6–10.3)
CHLORIDE: 109 mmol/L (ref 98–110)
CO2: 26 mmol/L (ref 20–32)
Creat: 1.19 mg/dL — ABNORMAL HIGH (ref 0.70–1.18)
GFR, EST NON AFRICAN AMERICAN: 59 mL/min/{1.73_m2} — AB (ref >=60)
GFR, Est African American: 68 mL/min/{1.73_m2} (ref >=60)
Globulin: 2.4 g/dL (calc) (ref 1.9–3.7)
Glucose, Bld: 87 mg/dL (ref 65–99)
POTASSIUM: 4.2 mmol/L (ref 3.5–5.3)
Sodium: 141 mmol/L (ref 135–146)
Total Bilirubin: 0.4 mg/dL (ref 0.2–1.2)
Total Protein: 6.7 g/dL (ref 6.1–8.1)

## 2018-03-04 LAB — LIPID PANEL
Cholesterol: 165 mg/dL (ref <200)
HDL: 42 mg/dL (ref >40)
LDL Cholesterol (Calc): 101 mg/dL (calc) — ABNORMAL HIGH
NON-HDL CHOLESTEROL (CALC): 123 mg/dL (ref <130)
Total CHOL/HDL Ratio: 3.9 (calc) (ref <5.0)
Triglycerides: 119 mg/dL (ref <150)

## 2018-03-04 LAB — MAGNESIUM: Magnesium: 2.2 mg/dL (ref 1.5–2.5)

## 2018-03-04 LAB — TSH: TSH: 0.99 mIU/L (ref 0.40–4.50)

## 2018-05-18 ENCOUNTER — Ambulatory Visit: Payer: Self-pay | Admitting: Internal Medicine

## 2018-06-03 ENCOUNTER — Ambulatory Visit: Payer: Self-pay | Admitting: Internal Medicine

## 2018-06-09 ENCOUNTER — Encounter: Payer: Self-pay | Admitting: Internal Medicine

## 2018-06-09 ENCOUNTER — Ambulatory Visit (INDEPENDENT_AMBULATORY_CARE_PROVIDER_SITE_OTHER): Payer: Medicare HMO | Admitting: Internal Medicine

## 2018-06-09 VITALS — BP 136/76 | HR 64 | Temp 97.6°F | Resp 18 | Ht 72.0 in | Wt 183.2 lb

## 2018-06-09 DIAGNOSIS — E782 Mixed hyperlipidemia: Secondary | ICD-10-CM | POA: Diagnosis not present

## 2018-06-09 DIAGNOSIS — R7309 Other abnormal glucose: Secondary | ICD-10-CM | POA: Diagnosis not present

## 2018-06-09 DIAGNOSIS — Z79899 Other long term (current) drug therapy: Secondary | ICD-10-CM | POA: Diagnosis not present

## 2018-06-09 DIAGNOSIS — R7303 Prediabetes: Secondary | ICD-10-CM

## 2018-06-09 DIAGNOSIS — I10 Essential (primary) hypertension: Secondary | ICD-10-CM

## 2018-06-09 DIAGNOSIS — E559 Vitamin D deficiency, unspecified: Secondary | ICD-10-CM | POA: Diagnosis not present

## 2018-06-09 NOTE — Patient Instructions (Signed)

## 2018-06-09 NOTE — Progress Notes (Signed)
This very nice 77 y.o.male presents for 6 month follow up with HTN, HLD, Pre-Diabetes and Vitamin D Deficiency. Patient is followed by Dr Irene Limbo for Stage 0 CLL>      Patient is treated for HTN (2004)  & BP has been controlled at home. Today's BP was initially sl elevated & rechecked at goal -  136/76. Patient has had no complaints of any cardiac type chest pain, palpitations, dyspnea / orthopnea / PND, dizziness, claudication, or dependent edema.     Hyperlipidemia is controlled with diet & meds. Patient denies myalgias or other med SE's. Last Lipids were  Lab Results  Component Value Date   CHOL 165 03/03/2018   HDL 42 03/03/2018   LDLCALC 101 (H) 03/03/2018   TRIG 119 03/03/2018   CHOLHDL 3.9 03/03/2018      Also, the patient has history of PreDiabetes  (A1c 5.8%/2010)  and has had no symptoms of reactive hypoglycemia, diabetic polys, paresthesias or visual blurring.  Last A1c was Normal & at goal: Lab Results  Component Value Date   HGBA1C 5.5 11/05/2017      Further, the patient also has history of Vitamin D Deficiency and supplements vitamin D without any suspected side-effects. Last vitamin D was at goal:   Lab Results  Component Value Date   VD25OH 79 11/05/2017   Current Outpatient Medications on File Prior to Visit  Medication Sig  . ALPRAZolam (XANAX) 0.5 MG tablet TAKE ONE-HALF TABLET BY MOUTH 3 TIMES DAILY BEFORE MEALS AND ONE TABLET DAILY AT BEDTIME  . aspirin EC 81 MG tablet Take 81 mg by mouth daily.  . benazepril-hydrochlorthiazide (LOTENSIN HCT) 20-25 MG tablet TAKE 1 TABLET EVERY DAY FOR BLOOD PRESSURE  . Cholecalciferol (VITAMIN D3) 5000 units TABS Take 5,000 Units by mouth daily.  . pantoprazole (PROTONIX) 40 MG tablet Take 1 tablet (40 mg total) by mouth daily.  . pravastatin (PRAVACHOL) 40 MG tablet TAKE 1 TABLET AT BEDTIME  FOR  CHOLESTEROL   No current facility-administered medications on file prior to visit.    Allergies  Allergen Reactions  .  Penicillins Other (See Comments)    Unknown allergic reaction as a teenager   . Ppd [Tuberculin Purified Protein Derivative]     Positive test in 2004   PMHx:   Past Medical History:  Diagnosis Date  . Diverticulosis   . Hyperlipidemia   . Hypertension   . Prediabetes   . Vitamin D deficiency    Immunization History  Administered Date(s) Administered  . Influenza Split 07/28/2013  . Influenza, High Dose Seasonal PF 09/11/2015, 06/09/2016  . Pneumococcal Conjugate-13 11/05/2017  . Pneumococcal-Unspecified 07/01/1999, 09/20/2009  . Td 09/20/2009  . Zoster 11/08/2009   Past Surgical History:  Procedure Laterality Date  . NO PAST SURGERIES     FHx:    Reviewed / unchanged  SHx:    Reviewed / unchanged   Systems Review:  Constitutional: Denies fever, chills, wt changes, headaches, insomnia, fatigue, night sweats, change in appetite. Eyes: Denies redness, blurred vision, diplopia, discharge, itchy, watery eyes.  ENT: Denies discharge, congestion, post nasal drip, epistaxis, sore throat, earache, hearing loss, dental pain, tinnitus, vertigo, sinus pain, snoring.  CV: Denies chest pain, palpitations, irregular heartbeat, syncope, dyspnea, diaphoresis, orthopnea, PND, claudication or edema. Respiratory: denies cough, dyspnea, DOE, pleurisy, hoarseness, laryngitis, wheezing.  Gastrointestinal: Denies dysphagia, odynophagia, heartburn, reflux, water brash, abdominal pain or cramps, nausea, vomiting, bloating, diarrhea, constipation, hematemesis, melena, hematochezia  or hemorrhoids. Genitourinary: Denies  dysuria, frequency, urgency, nocturia, hesitancy, discharge, hematuria or flank pain. Musculoskeletal: Denies arthralgias, myalgias, stiffness, jt. swelling, pain, limping or strain/sprain.  Skin: Denies pruritus, rash, hives, warts, acne, eczema or change in skin lesion(s). Neuro: No weakness, tremor, incoordination, spasms, paresthesia or pain. Psychiatric: Denies confusion, memory  loss or sensory loss. Endo: Denies change in weight, skin or hair change.  Heme/Lymph: No excessive bleeding, bruising or enlarged lymph nodes.  Physical Exam  BP 136/76   Pulse 64   Temp 97.6 F (36.4 C)   Resp 18   Ht 6' (1.829 m)   Wt 183 lb 3.2 oz (83.1 kg)   BMI 24.85 kg/m   Appears  well nourished, well groomed  and in no distress.  Eyes: PERRLA, EOMs, conjunctiva no swelling or erythema. Sinuses: No frontal/maxillary tenderness ENT/Mouth: EAC's clear, TM's nl w/o erythema, bulging. Nares clear w/o erythema, swelling, exudates. Oropharynx clear without erythema or exudates. Oral hygiene is good. Tongue normal, non obstructing. Hearing intact.  Neck: Supple. Thyroid not palpable. Car 2+/2+ without bruits, nodes or JVD. Chest: Respirations nl with BS clear & equal w/o rales, rhonchi, wheezing or stridor.  Cor: Heart sounds normal w/ regular rate and rhythm without sig. murmurs, gallops, clicks or rubs. Peripheral pulses normal and equal  without edema.  Abdomen: Soft & bowel sounds normal. Non-tender w/o guarding, rebound, hernias, masses or organomegaly.  Lymphatics: Unremarkable.  Musculoskeletal: Full ROM all peripheral extremities, joint stability, 5/5 strength and normal gait.  Skin: Warm, dry without exposed rashes, lesions or ecchymosis apparent.  Neuro: Cranial nerves intact, reflexes equal bilaterally. Sensory-motor testing grossly intact. Tendon reflexes grossly intact.  Pysch: Alert & oriented x 3.  Insight and judgement nl & appropriate. No ideations.  Assessment and Plan:  1. Essential hypertension  - Continue medication, monitor blood pressure at home.  - Continue DASH diet.  Reminder to go to the ER if any CP,  SOB, nausea, dizziness, severe HA, changes vision/speech.  - CBC with Differential/Platelet - COMPLETE METABOLIC PANEL WITH GFR - Magnesium - TSH  2. Hyperlipidemia, mixed  - Continue diet/meds, exercise,& lifestyle modifications.  - Continue  monitor periodic cholesterol/liver & renal functions   - Lipid panel - TSH  3. Abnormal glucose  - Continue diet, exercise, lifestyle modifications.  - Monitor appropriate labs.  - Hemoglobin A1c - Insulin, random  4. Vitamin D deficiency  - Continue supplementation.  - VITAMIN D 25 Hydroxyl  5. Prediabetes  - Hemoglobin A1c - Insulin, random  6. Medication management  - CBC with Differential/Platelet - COMPLETE METABOLIC PANEL WITH GFR - Magnesium - Lipid panel - TSH - Hemoglobin A1c - Insulin, random - VITAMIN D 25 Hydroxyl       Discussed  regular exercise, BP monitoring, weight control to achieve/maintain BMI less than 25 and discussed med and SE's. Recommended labs to assess and monitor clinical status with further disposition pending results of labs. Over 30 minutes of exam, counseling, chart review was performed.

## 2018-06-10 LAB — CBC WITH DIFFERENTIAL/PLATELET
BASOS ABS: 107 {cells}/uL (ref 0–200)
Basophils Relative: 0.2 %
EOS ABS: 54 {cells}/uL (ref 15–500)
EOS PCT: 0.1 %
HCT: 41.2 % (ref 38.5–50.0)
Hemoglobin: 13.3 g/dL (ref 13.2–17.1)
Lymphs Abs: 49595 cells/uL — ABNORMAL HIGH (ref 850–3900)
MCH: 29.2 pg (ref 27.0–33.0)
MCHC: 32.3 g/dL (ref 32.0–36.0)
MCV: 90.5 fL (ref 80.0–100.0)
MONOS PCT: 1.4 %
MPV: 12.3 fL (ref 7.5–12.5)
NEUTROS ABS: 2996 {cells}/uL (ref 1500–7800)
Neutrophils Relative %: 5.6 %
PLATELETS: 155 10*3/uL (ref 140–400)
RBC: 4.55 10*6/uL (ref 4.20–5.80)
RDW: 12.7 % (ref 11.0–15.0)
TOTAL LYMPHOCYTE: 92.7 %
WBC mixed population: 749 cells/uL (ref 200–950)
WBC: 53.5 10*3/uL — ABNORMAL HIGH (ref 3.8–10.8)

## 2018-06-10 LAB — INSULIN, RANDOM: Insulin: 45.5 u[IU]/mL — ABNORMAL HIGH (ref 2.0–19.6)

## 2018-06-10 LAB — LIPID PANEL
Cholesterol: 164 mg/dL (ref <200)
HDL: 42 mg/dL (ref >40)
LDL Cholesterol (Calc): 101 mg/dL (calc) — ABNORMAL HIGH
Non-HDL Cholesterol (Calc): 122 mg/dL (calc) (ref <130)
TRIGLYCERIDES: 117 mg/dL (ref <150)
Total CHOL/HDL Ratio: 3.9 (calc) (ref <5.0)

## 2018-06-10 LAB — COMPLETE METABOLIC PANEL WITH GFR
AG RATIO: 1.8 (calc) (ref 1.0–2.5)
ALKALINE PHOSPHATASE (APISO): 58 U/L (ref 40–115)
ALT: 15 U/L (ref 9–46)
AST: 14 U/L (ref 10–35)
Albumin: 4.2 g/dL (ref 3.6–5.1)
BILIRUBIN TOTAL: 0.5 mg/dL (ref 0.2–1.2)
BUN/Creatinine Ratio: 13 (calc) (ref 6–22)
BUN: 17 mg/dL (ref 7–25)
CHLORIDE: 106 mmol/L (ref 98–110)
CO2: 28 mmol/L (ref 20–32)
Calcium: 9.8 mg/dL (ref 8.6–10.3)
Creat: 1.29 mg/dL — ABNORMAL HIGH (ref 0.70–1.18)
GFR, EST AFRICAN AMERICAN: 62 mL/min/{1.73_m2} (ref >=60)
GFR, Est Non African American: 53 mL/min/{1.73_m2} — ABNORMAL LOW (ref >=60)
GLUCOSE: 88 mg/dL (ref 65–99)
Globulin: 2.4 g/dL (calc) (ref 1.9–3.7)
POTASSIUM: 4.2 mmol/L (ref 3.5–5.3)
Sodium: 141 mmol/L (ref 135–146)
Total Protein: 6.6 g/dL (ref 6.1–8.1)

## 2018-06-10 LAB — HEMOGLOBIN A1C
EAG (MMOL/L): 6.5 (calc)
Hgb A1c MFr Bld: 5.7 % of total Hgb — ABNORMAL HIGH (ref <5.7)
Mean Plasma Glucose: 117 (calc)

## 2018-06-10 LAB — MAGNESIUM: MAGNESIUM: 2.3 mg/dL (ref 1.5–2.5)

## 2018-06-10 LAB — VITAMIN D 25 HYDROXY (VIT D DEFICIENCY, FRACTURES): Vit D, 25-Hydroxy: 66 ng/mL (ref 30–100)

## 2018-06-10 LAB — TSH: TSH: 0.89 mIU/L (ref 0.40–4.50)

## 2018-06-11 ENCOUNTER — Telehealth: Payer: Self-pay

## 2018-06-11 NOTE — Telephone Encounter (Signed)
-----   Message from Brunetta Genera, MD sent at 06/11/2018  8:05 AM EDT ----- Its appears this patient has lost to oncology f/u since 10/2016 when he was last seen.  Lanelle Bal could you please schedule patient for a f/u in the next 3-4 weeks with labs if patient wants to continue oncology followup and isnt being seen elsewhere.  Thanks Sullivan Lone MD MS    ----- Message ----- From: Unk Pinto, MD Sent: 06/10/2018  10:30 PM EDT To: Melbourne Abts, RN, Brunetta Genera, MD   - WBC  Is slightly higher 53,000 - red cell count & platelet cts are still OK   ++++++++++++++++++++++++++++++++++++++++++++++++++++ - Chol 164 and LDL 101 - Great   - A1c up slightly again to 5.7% -  avoid   sweets,   candy   &   white   stuff -   rice,   potatoes ,  breads   and    pasta  - Vit D 66 - Great   - All Else  - Kidneys - Electrolytes -  Liver - Magnesium & Thyroid  - all  Normal / OK

## 2018-06-11 NOTE — Telephone Encounter (Signed)
Attempted to call patient twice with no answer. Left a voicemail informing patient that Dr. Irene Limbo is willing to see him in the office in the next 3-4 weeks if patient wishes to continue oncology followup and is not being seen elsewhere. Left message to call [phone] if patient wishes to schedule an appointment.

## 2018-09-10 ENCOUNTER — Ambulatory Visit: Payer: Self-pay | Admitting: Adult Health

## 2018-09-14 NOTE — Progress Notes (Signed)
FOLLOW UP  Assessment and Plan:   Hypertension Well controlled with current medications  Monitor blood pressure at home; patient to call if consistently greater than 130/80 Continue DASH diet.   Reminder to go to the ER if any CP, SOB, nausea, dizziness, severe HA, changes vision/speech, left arm numbness and tingling and jaw pain.  Cholesterol Currently at goal; continue pravastatin 40 mg daily  Continue low cholesterol diet and exercise.  Check lipid panel.   Prediabetes Continue diet and exercise.  Perform daily foot/skin check, notify office of any concerning changes.  Check A1C  BMI 24 Continue to recommend diet heavy in fruits and veggies and low in animal meats, cheeses, and dairy products, appropriate calorie intake Discuss exercise recommendations routinely Continue to monitor weight at each visit  Vitamin D Def At goal at last visit; continue supplementation to maintain goal of 70-100 Defer Vit D level  Anxiety Well managed by current regimen; continue medications; rare use of benzo Stress management techniques discussed, increase water, good sleep hygiene discussed, increase exercise, and increase veggies.    GERD Well managed on current medications; PPI due to esophagitis Discussed diet, avoiding triggers and other lifestyle changes Monitor magnesium, calcium, vit D  CLL Check CBC; he is overdue for follow up with Dr. Irene Limbo; # given to call and schedule He is reporting some concerning night sweats over the last month without any other notable changes.   Continue diet and meds as discussed. Further disposition pending results of labs. Discussed med's effects and SE's.   Over 30 minutes of exam, counseling, chart review, and critical decision making was performed.   Future Appointments  Date Time Provider [place]  12/13/2018  3:00 PM Unk Pinto, MD [facility] None     ----------------------------------------------------------------------------------------------------------------------  HPI 77 y.o. male  presents for 3 month follow up on hypertension, cholesterol, prediabetes, weight and vitamin D deficiency.   he has a diagnosis of anxiety and is currently on xanax 0.25 mg QID PRN, reports symptoms are well controlled on current regimen. he reports symptoms are improved, hasn't used in over a month.   BMI is Body mass index is 25.06 kg/m., he has been working on diet and exercise. Wt Readings from Last 3 Encounters:  09/15/18 184 lb 12.8 oz (83.8 kg)  06/09/18 183 lb 3.2 oz (83.1 kg)  03/03/18 184 lb 9.6 oz (83.7 kg)   His blood pressure has been controlled at home, today their BP is BP: 130/70  He does workout. He denies chest pain, shortness of breath, dizziness.   He is on cholesterol medication Pravastatin and denies myalgias. His cholesterol is at goal. The cholesterol last visit was:   Lab Results  Component Value Date   CHOL 164 06/09/2018   HDL 42 06/09/2018   LDLCALC 101 (H) 06/09/2018   TRIG 117 06/09/2018   CHOLHDL 3.9 06/09/2018    He has been working on diet and exercise for prediabetes, and denies increased appetite, nausea, paresthesia of the feet, polydipsia and polyuria. Last A1C in the office was:  Lab Results  Component Value Date   HGBA1C 5.7 (H) 06/09/2018   Patient is on Vitamin D supplement.   Lab Results  Component Value Date   VD25OH 66 06/09/2018        Current Medications:  Current Outpatient Medications on File Prior to Visit  Medication Sig  . ALPRAZolam (XANAX) 0.5 MG tablet TAKE ONE-HALF TABLET BY MOUTH 3 TIMES DAILY BEFORE MEALS AND ONE TABLET DAILY AT BEDTIME  .  aspirin EC 81 MG tablet Take 81 mg by mouth daily.  . benazepril-hydrochlorthiazide (LOTENSIN HCT) 20-25 MG tablet TAKE 1 TABLET EVERY DAY FOR BLOOD PRESSURE  . Cholecalciferol (VITAMIN D3) 5000 units TABS Take 5,000 Units by mouth daily.   . pantoprazole (PROTONIX) 40 MG tablet Take 1 tablet (40 mg total) by mouth daily.  . pravastatin (PRAVACHOL) 40 MG tablet TAKE 1 TABLET AT BEDTIME  FOR  CHOLESTEROL   No current facility-administered medications on file prior to visit.      Allergies:  Allergies  Allergen Reactions  . Penicillins Other (See Comments)    Unknown allergic reaction as a teenager   . Ppd [Tuberculin Purified Protein Derivative]     Positive test in 2004     Medical History:  Past Medical History:  Diagnosis Date  . Diverticulosis   . Hyperlipidemia   . Hypertension   . Prediabetes   . Vitamin D deficiency    Family history- Reviewed and unchanged Social history- Reviewed and unchanged   Review of Systems:  Review of Systems  Constitutional: Negative for malaise/fatigue and weight loss.  HENT: Negative for hearing loss and tinnitus.   Eyes: Negative for blurred vision and double vision.  Respiratory: Negative for cough, shortness of breath and wheezing.   Cardiovascular: Negative for chest pain, palpitations, orthopnea, claudication and leg swelling.  Gastrointestinal: Negative for abdominal pain, blood in stool, constipation, diarrhea, heartburn, melena, nausea and vomiting.  Genitourinary: Negative.   Musculoskeletal: Negative for joint pain and myalgias.  Skin: Negative for rash.  Neurological: Negative for dizziness, tingling, sensory change, weakness and headaches.  Endo/Heme/Allergies: Negative for polydipsia.  Psychiatric/Behavioral: Negative.   All other systems reviewed and are negative.     Physical Exam: BP 130/70   Pulse 66   Temp 97.9 F (36.6 C)   Ht 6' (1.829 m)   Wt 184 lb 12.8 oz (83.8 kg)   SpO2 98%   BMI 25.06 kg/m  Wt Readings from Last 3 Encounters:  09/15/18 184 lb 12.8 oz (83.8 kg)  06/09/18 183 lb 3.2 oz (83.1 kg)  03/03/18 184 lb 9.6 oz (83.7 kg)   General Appearance: Well nourished, in no apparent distress. Eyes: PERRLA, EOMs, conjunctiva no  swelling or erythema Sinuses: No Frontal/maxillary tenderness ENT/Mouth: Ext aud canals clear, TMs without erythema, bulging. No erythema, swelling, or exudate on post pharynx.  Tonsils not swollen or erythematous. Hearing normal.  Neck: Supple, thyroid normal.  Respiratory: Respiratory effort normal, BS equal bilaterally without rales, rhonchi, wheezing or stridor.  Cardio: RRR with no MRGs. Brisk peripheral pulses without edema.  Abdomen: Soft, + BS.  Non tender, no guarding, rebound, hernias, masses. Lymphatics: Non tender without lymphadenopathy.  Musculoskeletal: Full ROM, 5/5 strength, Normal gait Skin: Warm, dry without rashes, lesions, ecchymosis.  Neuro: Cranial nerves intact. No cerebellar symptoms.  Psych: Awake and oriented X 3, normal affect, Insight and Judgment appropriate.    Izora Ribas, NP 11:06 AM [facility]

## 2018-09-15 ENCOUNTER — Ambulatory Visit (INDEPENDENT_AMBULATORY_CARE_PROVIDER_SITE_OTHER): Payer: Medicare HMO | Admitting: Adult Health

## 2018-09-15 ENCOUNTER — Encounter: Payer: Self-pay | Admitting: Adult Health

## 2018-09-15 VITALS — BP 130/70 | HR 66 | Temp 97.9°F | Ht 72.0 in | Wt 184.8 lb

## 2018-09-15 DIAGNOSIS — Z79899 Other long term (current) drug therapy: Secondary | ICD-10-CM

## 2018-09-15 DIAGNOSIS — Z6824 Body mass index (BMI) 24.0-24.9, adult: Secondary | ICD-10-CM | POA: Diagnosis not present

## 2018-09-15 DIAGNOSIS — F419 Anxiety disorder, unspecified: Secondary | ICD-10-CM

## 2018-09-15 DIAGNOSIS — K2100 Gastro-esophageal reflux disease with esophagitis, without bleeding: Secondary | ICD-10-CM

## 2018-09-15 DIAGNOSIS — Z23 Encounter for immunization: Secondary | ICD-10-CM

## 2018-09-15 DIAGNOSIS — C9110 Chronic lymphocytic leukemia of B-cell type not having achieved remission: Secondary | ICD-10-CM | POA: Diagnosis not present

## 2018-09-15 DIAGNOSIS — I10 Essential (primary) hypertension: Secondary | ICD-10-CM

## 2018-09-15 DIAGNOSIS — E559 Vitamin D deficiency, unspecified: Secondary | ICD-10-CM | POA: Diagnosis not present

## 2018-09-15 DIAGNOSIS — R7303 Prediabetes: Secondary | ICD-10-CM

## 2018-09-15 DIAGNOSIS — E782 Mixed hyperlipidemia: Secondary | ICD-10-CM | POA: Diagnosis not present

## 2018-09-15 NOTE — Patient Instructions (Addendum)
Dr. Irene Limbo -  call [phone]  To schedule an appointment to follow up on blood  Try walking every day - goal is to get to 30 minutes most days of the week   Know what a healthy weight is for you (roughly BMI <25) and aim to maintain this  Aim for 7+ servings of fruits and vegetables daily  65-80+ fluid ounces of water or unsweet tea for healthy kidneys  Limit to max 1 drink of alcohol per day; avoid smoking/tobacco  Limit animal fats in diet for cholesterol and heart health - choose grass fed whenever available  Avoid highly processed foods, and foods high in saturated/trans fats  Aim for low stress - take time to unwind and care for your mental health  Aim for 150 min of moderate intensity exercise weekly for heart health, and weights twice weekly for bone health  Aim for 7-9 hours of sleep daily

## 2018-09-16 LAB — COMPLETE METABOLIC PANEL WITH GFR
AG Ratio: 1.6 (calc) (ref 1.0–2.5)
ALBUMIN MSPROF: 4.2 g/dL (ref 3.6–5.1)
ALKALINE PHOSPHATASE (APISO): 64 U/L (ref 40–115)
ALT: 14 U/L (ref 9–46)
AST: 15 U/L (ref 10–35)
BUN / CREAT RATIO: 14 (calc) (ref 6–22)
BUN: 18 mg/dL (ref 7–25)
CO2: 29 mmol/L (ref 20–32)
CREATININE: 1.28 mg/dL — AB (ref 0.70–1.18)
Calcium: 10 mg/dL (ref 8.6–10.3)
Chloride: 107 mmol/L (ref 98–110)
GFR, Est African American: 62 mL/min/{1.73_m2} (ref >=60)
GFR, Est Non African American: 54 mL/min/{1.73_m2} — ABNORMAL LOW (ref >=60)
GLUCOSE: 71 mg/dL (ref 65–99)
Globulin: 2.6 g/dL (calc) (ref 1.9–3.7)
Potassium: 4.3 mmol/L (ref 3.5–5.3)
Sodium: 143 mmol/L (ref 135–146)
TOTAL PROTEIN: 6.8 g/dL (ref 6.1–8.1)
Total Bilirubin: 0.4 mg/dL (ref 0.2–1.2)

## 2018-09-16 LAB — LIPID PANEL
Cholesterol: 188 mg/dL (ref <200)
HDL: 41 mg/dL (ref >40)
LDL CHOLESTEROL (CALC): 114 mg/dL — AB
Non-HDL Cholesterol (Calc): 147 mg/dL (calc) — ABNORMAL HIGH (ref <130)
TRIGLYCERIDES: 214 mg/dL — AB (ref <150)
Total CHOL/HDL Ratio: 4.6 (calc) (ref <5.0)

## 2018-09-16 LAB — CBC WITH DIFFERENTIAL/PLATELET
Absolute Monocytes: 953 cells/uL — ABNORMAL HIGH (ref 200–950)
BASOS ABS: 64 {cells}/uL (ref 0–200)
BASOS PCT: 0.1 %
EOS PCT: 0.1 %
Eosinophils Absolute: 64 cells/uL (ref 15–500)
HCT: 42.7 % (ref 38.5–50.0)
HEMOGLOBIN: 14 g/dL (ref 13.2–17.1)
LYMPHS ABS: 58865 {cells}/uL — AB (ref 850–3900)
MCH: 29.8 pg (ref 27.0–33.0)
MCHC: 32.8 g/dL (ref 32.0–36.0)
MCV: 90.9 fL (ref 80.0–100.0)
MONOS PCT: 1.5 %
MPV: 12.3 fL (ref 7.5–12.5)
NEUTROS ABS: 3556 {cells}/uL (ref 1500–7800)
Neutrophils Relative %: 5.6 %
PLATELETS: 157 10*3/uL (ref 140–400)
RBC: 4.7 10*6/uL (ref 4.20–5.80)
RDW: 12.5 % (ref 11.0–15.0)
Total Lymphocyte: 92.7 %
WBC: 63.5 10*3/uL — ABNORMAL HIGH (ref 3.8–10.8)

## 2018-09-16 LAB — MAGNESIUM: MAGNESIUM: 2.3 mg/dL (ref 1.5–2.5)

## 2018-09-16 LAB — HEMOGLOBIN A1C
EAG (MMOL/L): 6 (calc)
HEMOGLOBIN A1C: 5.4 %{Hb} (ref <5.7)
MEAN PLASMA GLUCOSE: 108 (calc)

## 2018-09-16 LAB — TSH: TSH: 0.86 mIU/L (ref 0.40–4.50)

## 2018-09-27 ENCOUNTER — Other Ambulatory Visit: Payer: Self-pay | Admitting: Adult Health

## 2018-09-28 ENCOUNTER — Telehealth: Payer: Self-pay | Admitting: Hematology

## 2018-09-28 NOTE — Telephone Encounter (Signed)
Tried to reach regarding voicemail I did leave a message °

## 2018-10-04 ENCOUNTER — Other Ambulatory Visit: Payer: Self-pay | Admitting: *Deleted

## 2018-10-04 MED ORDER — PRAVASTATIN SODIUM 40 MG PO TABS: ORAL_TABLET | ORAL | 3 refills | Status: DC

## 2018-11-10 ENCOUNTER — Telehealth: Payer: Self-pay | Admitting: Adult Health

## 2018-11-10 NOTE — Telephone Encounter (Signed)
Still need to schedule follow up labs with Dr Irene Limbo? From jan 2020 request. Patient states too hard to reach scheduling, could our office schedule this appointment and advise patient of day and time.  Sent Dr Grier Mitts office a staff message to please make appointment and we will notify patient.  Scheduler may need to ask Dr Irene Limbo what type appointmentt.. OV or lab only.

## 2018-11-18 NOTE — Progress Notes (Signed)
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 male who has been referred to [place] by Dr .Unk Pinto, MD for evaluation and management of lymphocytosis concerning for a lymphoproliferative process.  Patient has a h/o HTN, HLD, Prediabetes who notes that he had significant abdominal discomfort with significant dyspeptic symptoms around aug 2017 and had about 20-25lbs weight loss and had an extensive GI workup including CT abd/pelvis 05/25/2016 - WNL, EGD which showed chronic gastritis (helicobacter pylori +ve) and colonscopy with some polyps. Capsule endoscopy was not approved by insurance. Patients symptoms improved after treatment of H.pylori and he notes that he has gained back about 6-7 lbs and is eating better.  Patients labs were noted to show some leucocytosis/Lymphocytosis for which he was referred to [place] for further evaluation.  Patient notes some nightsweats and weight loss (as noted above, possible alternative explanation). No fevers/chills. Has not noted any overt enlarged LN and CT abd in 04/2016 showed no hepatomegaly or splenomegaly and no abd/RP LNadenopathy.  Patient notes no other acute focal symptoms.   INTERVAL HISTORY  Javier [name] is here for management and evaluation of his CLL . The patient's last visit with [place] was on 11/06/16, and was subsequently lost to follow up until today. He is accompanied today by his wife. The pt reports that he is doing  well overall.   The pt reports that he feels that his "belly is full of air," and has been going following up with GI recently. He notes that this sense is worse in the morning, and improves throughout the day. He does not feel full after eating. He has frequent episodes of belching. He notes that he moves his bowels fair, and takes miralax to stay regular. He denies taking any new medications in the last 2 years. He adds that with his sensation of abdominal fullness and belching yesterday, he felt a sense of imminent doom, which he does not presently feel today. He denies CP or SOB.   The pt denies fevers or chills. He notes that he has had drenching night sweats nearly every night for the past 3 months, and denies these getting worse. He denies any recent infections. He notes that he has had worse fatigue and feelings of weakness recently as well. He denies noticing any new lumps or bumps. He denies unexpected weight loss.   The pt notes that he has had some loss of hearing in his left ear in the last two years. He notes that he hears an occasional buzzing noise in his left ear. He denies a previous hobby or occupation involving loud noises. He will be seeing the [facility] soon for a hearing evaluation and work up.  The pt endorses lower abdominal pain. He takes a water pill and notes he urinates "all the time." He feels that he empties his bladder after urination and denies weak stream.  Lab results today (11/19/18) of CBC w/diff is as follows: all values are WNL except for WBC at 56.4k, Lymphs abs at 53.1k.  11/19/18 CMP Is stable. 11/19/18 LDH is wnl  On review of systems, pt reports night sweats, new fatigue, regular bowel movements, normal but frequent urination, lower abdominal pain, abdominal fullness, episodic belching, and denies changes in bowel habits, blood in the stools, black stools, leg swelling, SOB, CP, pain along the spine, and any other symptoms.   MEDICAL HISTORY:  Past Medical History:    Diagnosis Date  . Diverticulosis   . Hyperlipidemia   . Hypertension   . Prediabetes   . Vitamin D deficiency   Helicobacter pylori +ve   SURGICAL HISTORY: Past Surgical History:  Procedure Laterality Date  . NO PAST SURGERIES      SOCIAL HISTORY: Social History   Socioeconomic History  . Marital status: Married    Spouse name: Not on file  . Number of children: 5  . Years of education: Not on file  . Highest education level: Not on file  Occupational History  . Occupation: retired  Scientific laboratory technician  . Financial resource strain: Not on file  . Food insecurity:    Worry: Not on file    Inability: Not on file  . Transportation needs:    Medical: Not on file    Non-medical: Not on file  Tobacco Use  . Smoking status: Former Smoker    Types: Cigarettes    Last attempt to quit: 01/17/2003    Years since quitting: 15.8  . Smokeless tobacco: Never Used  Substance and Sexual Activity  . Alcohol use: Yes    Comment: very rare  . Drug use: No  . Sexual activity: Not on file  Lifestyle  . Physical activity:    Days per week: Not on file    Minutes per session: Not on file  . Stress: Not on file  Relationships  . Social connections:    Talks on phone: Not on file    Gets together: Not on file    Attends religious service: Not on file    Active member of club or organization: Not on file    Attends meetings of clubs or organizations: Not on file    Relationship status: Not on file  . Intimate partner violence:    Fear of current or ex partner: Not on file    Emotionally abused: Not on file    Physically abused: Not on file    Forced sexual activity: Not on file  Other Topics Concern  . Not on file  Social History Narrative  . Not on file  Quit smoking >10 yrs ago, previously 1.5Packs per week. Started smoking in his teens. Retired. Worked as a Chief Financial Officer.   FAMILY HISTORY: Family History  Problem Relation Age of Onset  . Stroke Mother   . Early death  Father        Farm/tractor accident  . Diabetes Brother   . Cirrhosis Brother   . Colon cancer Paternal Grandfather     ALLERGIES:  is allergic to penicillins and ppd [tuberculin purified protein derivative].  MEDICATIONS:  Current Outpatient Medications  Medication Sig Dispense Refill  . ALPRAZolam (XANAX) 0.5 MG tablet TAKE ONE-HALF TABLET BY MOUTH 3 TIMES DAILY BEFORE MEALS AND ONE TABLET DAILY AT BEDTIME 90 tablet 0  . aspirin EC 81 MG tablet Take 81 mg by mouth daily.    . benazepril-hydrochlorthiazide (LOTENSIN HCT) 20-25 MG tablet TAKE 1 TABLET EVERY DAY FOR BLOOD PRESSURE 90 tablet 1  . Cholecalciferol (VITAMIN D3) 5000 units TABS Take 5,000 Units by mouth  daily.    . pantoprazole (PROTONIX) 40 MG tablet Take 1 tablet (40 mg total) by mouth daily. 14 tablet 0  . pravastatin (PRAVACHOL) 40 MG tablet TAKE 1 TABLET AT BEDTIME  FOR  CHOLESTEROL 90 tablet 3   No current facility-administered medications for this visit.     REVIEW OF SYSTEMS:    A 10+ POINT REVIEW OF SYSTEMS WAS OBTAINED including neurology, dermatology, psychiatry, cardiac, respiratory, lymph, extremities, GI, GU, Musculoskeletal, constitutional, breasts, reproductive, HEENT.  All pertinent positives are noted in the HPI.  All others are negative.   PHYSICAL EXAMINATION: ECOG PERFORMANCE STATUS: 1 - Symptomatic but completely ambulatory  Vitals:   11/19/18 0953  BP: 131/70  Pulse: 65  Resp: 17  Temp: 97.8 F (36.6 C)  SpO2: 99%   Filed Weights   11/19/18 0953  Weight: 182 lb 6.4 oz (82.7 kg)   .Body mass index is 24.74 kg/m.  GENERAL:alert, in no acute distress and comfortable SKIN: no acute rashes, no significant lesions EYES: conjunctiva are pink and non-injected, sclera anicteric OROPHARYNX: MMM, no exudates, no oropharyngeal erythema or ulceration NECK: supple, no JVD LYMPH:  no palpable lymphadenopathy in the cervical, axillary or inguinal regions LUNGS: clear to auscultation b/l with normal  respiratory effort HEART: regular rate & rhythm ABDOMEN:  normoactive bowel sounds , non tender, not distended. No palpable hepatosplenomegaly.  Extremity: no pedal edema PSYCH: alert & oriented x 3 with fluent speech NEURO: no focal motor/sensory deficits   LABORATORY DATA:  I have reviewed the data as listed  . CBC Latest Ref Rng & Units 11/19/2018 09/15/2018 06/09/2018  WBC 4.0 - 10.5 K/uL 56.4(HH) 63.5(H) 53.5(H)  Hemoglobin 13.0 - 17.0 g/dL 13.5 14.0 13.3  Hematocrit 39.0 - 52.0 % 42.0 42.7 41.2  Platelets 150 - 400 K/uL 155 157 155   . CBC    Component Value Date/Time   WBC 56.4 (HH) 11/19/2018 0928   WBC 63.5 (H) 09/15/2018 1116   RBC 4.56 11/19/2018 0928   HGB 13.5 11/19/2018 0928   HGB 14.5 10/21/2016 1324   HCT 42.0 11/19/2018 0928   HCT 43.8 10/21/2016 1324   PLT 155 11/19/2018 0928   PLT 157 10/21/2016 1324   MCV 92.1 11/19/2018 0928   MCV 90.9 10/21/2016 1324   MCH 29.6 11/19/2018 0928   MCHC 32.1 11/19/2018 0928   RDW 13.5 11/19/2018 0928   RDW 13.3 10/21/2016 1324   LYMPHSABS 53.1 (H) 11/19/2018 0928   LYMPHSABS 17.6 (H) 10/21/2016 1324   MONOABS 0.8 11/19/2018 0928   MONOABS 0.6 10/21/2016 1324   EOSABS 0.1 11/19/2018 0928   EOSABS 0.0 10/21/2016 1324   BASOSABS 0.1 11/19/2018 0928   BASOSABS 0.1 10/21/2016 1324     . CMP Latest Ref Rng & Units 11/19/2018 09/15/2018 06/09/2018  Glucose 70 - 99 mg/dL 109(H) 71 88  BUN 8 - 23 mg/dL '20 18 17  ' Creatinine 0.61 - 1.24 mg/dL 1.24 1.28(H) 1.29(H)  Sodium 135 - 145 mmol/L 142 143 141  Potassium 3.5 - 5.1 mmol/L 4.1 4.3 4.2  Chloride 98 - 111 mmol/L 107 107 106  CO2 22 - 32 mmol/L '26 29 28  ' Calcium 8.9 - 10.3 mg/dL 9.9 10.0 9.8  Total Protein 6.5 - 8.1 g/dL 7.1 6.8 6.6  Total Bilirubin 0.3 - 1.2 mg/dL 0.7 0.4 0.5  Alkaline Phos 38 - 126 U/L 66 - -  AST 15 - 41 U/L '17 15 14  ' ALT 0 - 44 U/L '13 14 15   ' .  Lab Results  Component Value Date   LDH 173 11/19/2018         RADIOGRAPHIC STUDIES: I  have personally reviewed the radiological images as listed and agreed with the findings in the report. No results found.  ASSESSMENT & PLAN:   78 y.o. 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 FISH panel showed p53 (17p13) deletion that would be consistent with this diagnosis . It often represents also be more aggressive form of CLL Differential diagnosis includes - CD5 neg CLL vs splenic lymphoma vs Marginal Zone lymphoma vs LPL (lymphoplasmacytic lymphoma) LDH WNL suggests against a high grade process. Currently no anemia or thrombocytopenia noted. Now with drenching night sweats and fatigue, no fevers/chills.  10/31/16 PET/CT scan shows some small hypermetabolic nodes in the thorax including bilateral hilar paratracheal) esophageal. These would be difficult to biopsy.  Lost to follow up between 11/06/16 and 11/19/18  PLAN -Discussed pt labwork today, 11/19/18; WBC at 56.4k, HGB normal at 13.5, PLT normal at 155k -11/19/18 LDH and TSH are pending -Discussed again that this is likely CD5 negative CLL with a p53 mutation  -We discussed again that that the p53 mutation suggests possibly more aggressive course of his CLL. -Discussed that the pt does meet criteria to consider initiating treatment at this time, which would likely be a targeted therapy -Will order repeat PET/CT to evaluate how his disease status has changed since 2 years ago, and in the context of night sweats, new fatigue, and increase in WBC, with a known 17p mutation comprising 73% of his lymphocytes. -Will plan possible biopsy sites after scan as well for tissue diagnosis -Discussed that I recommend following up with Dr. Melford Aase further regarding dyspepsia and feeling of impending doom, and rule out cardiologic etiology -Will see the pt back in 2 weeks   2) . Patient Active Problem List   Diagnosis Date Noted  . BMI 24.0-24.9, adult 03/01/2018  . Anxiety 02/08/2018  . CLL  (chronic lymphocytic leukemia) ([place]) 11/01/2016  . Encounter for Medicare annual wellness exam 09/11/2015  . GERD  02/09/2015  . Medication management 11/02/2013  . Hyperlipidemia   . Hypertension   . Prediabetes   . Vitamin D deficiency   . Diverticulosis    -f/u with PCP for management of other medical co-morbids  3) H.pylori gastritis -f/u with GI to ensure eradication of h.pylori post treatment.   PET/CT in 1 week RTC with Dr Irene Limbo in 2 weeks   All of the patients questions were answered with apparent satisfaction. The patient knows to call the clinic with any problems, questions or concerns.  The total time spent in the appt was 30 minutes and more than 50% was on counseling and direct patient cares.    Sullivan Lone MD MS AAHIVMS [facility] Hematology/Oncology Physician [facility]  (Office):       [phone] (Work cell):  [phone] (Fax):           [phone]  11/19/2018 10:36 AM  I, Baldwin [place], am acting as a scribe for Dr. Sullivan Lone.   .I have reviewed the above documentation for accuracy and completeness, and I agree with the above. Brunetta Genera MD

## 2018-11-19 ENCOUNTER — Telehealth: Payer: Self-pay | Admitting: Hematology

## 2018-11-19 ENCOUNTER — Inpatient Hospital Stay: Payer: Medicare HMO | Attending: Hematology | Admitting: Hematology

## 2018-11-19 ENCOUNTER — Other Ambulatory Visit: Payer: Self-pay | Admitting: *Deleted

## 2018-11-19 ENCOUNTER — Inpatient Hospital Stay: Payer: Medicare HMO

## 2018-11-19 VITALS — BP 131/70 | HR 65 | Temp 97.8°F | Resp 17 | Ht 72.0 in | Wt 182.4 lb

## 2018-11-19 DIAGNOSIS — C9110 Chronic lymphocytic leukemia of B-cell type not having achieved remission: Secondary | ICD-10-CM

## 2018-11-19 DIAGNOSIS — D479 Neoplasm of uncertain behavior of lymphoid, hematopoietic and related tissue, unspecified: Secondary | ICD-10-CM | POA: Diagnosis not present

## 2018-11-19 LAB — CBC WITH DIFFERENTIAL (CANCER CENTER ONLY)
Abs Immature Granulocytes: 0.05 10*3/uL (ref 0.00–0.07)
Basophils Absolute: 0.1 10*3/uL (ref 0.0–0.1)
Basophils Relative: 0 %
Eosinophils Absolute: 0.1 10*3/uL (ref 0.0–0.5)
Eosinophils Relative: 0 %
HCT: 42 % (ref 39.0–52.0)
HEMOGLOBIN: 13.5 g/dL (ref 13.0–17.0)
Immature Granulocytes: 0 %
LYMPHS ABS: 53.1 10*3/uL — AB (ref 0.7–4.0)
Lymphocytes Relative: 95 %
MCH: 29.6 pg (ref 26.0–34.0)
MCHC: 32.1 g/dL (ref 30.0–36.0)
MCV: 92.1 fL (ref 80.0–100.0)
Monocytes Absolute: 0.8 10*3/uL (ref 0.1–1.0)
Monocytes Relative: 1 %
Neutro Abs: 2.4 10*3/uL (ref 1.7–7.7)
Neutrophils Relative %: 4 %
Platelet Count: 155 10*3/uL (ref 150–400)
RBC: 4.56 MIL/uL (ref 4.22–5.81)
RDW: 13.5 % (ref 11.5–15.5)
WBC Count: 56.4 10*3/uL (ref 4.0–10.5)
nRBC: 0.1 % (ref 0.0–0.2)

## 2018-11-19 LAB — CMP (CANCER CENTER ONLY)
ALBUMIN: 4.1 g/dL (ref 3.5–5.0)
ALT: 13 U/L (ref 0–44)
AST: 17 U/L (ref 15–41)
Alkaline Phosphatase: 66 U/L (ref 38–126)
Anion gap: 9 (ref 5–15)
BUN: 20 mg/dL (ref 8–23)
CO2: 26 mmol/L (ref 22–32)
Calcium: 9.9 mg/dL (ref 8.9–10.3)
Chloride: 107 mmol/L (ref 98–111)
Creatinine: 1.24 mg/dL (ref 0.61–1.24)
GFR, Est AFR Am: >60 mL/min (ref >60)
GFR, Estimated: 56 mL/min — ABNORMAL LOW (ref >60)
Glucose, Bld: 109 mg/dL — ABNORMAL HIGH (ref 70–99)
Potassium: 4.1 mmol/L (ref 3.5–5.1)
SODIUM: 142 mmol/L (ref 135–145)
Total Bilirubin: 0.7 mg/dL (ref 0.3–1.2)
Total Protein: 7.1 g/dL (ref 6.5–8.1)

## 2018-11-19 LAB — LACTATE DEHYDROGENASE: LDH: 173 U/L (ref 98–192)

## 2018-11-19 LAB — TSH: TSH: 0.655 u[IU]/mL (ref 0.320–4.118)

## 2018-11-19 NOTE — Telephone Encounter (Signed)
Scheduled appt per 2/21 los.  Gave patient the number to central radiology to schedule PET scan.

## 2018-11-23 ENCOUNTER — Emergency Department (HOSPITAL_COMMUNITY)
Admission: EM | Admit: 2018-11-23 | Discharge: 2018-11-23 | Disposition: A | Discharge status: Home / Self Care | Payer: Medicare HMO | Attending: Emergency Medicine | Admitting: Emergency Medicine

## 2018-11-23 ENCOUNTER — Encounter (HOSPITAL_COMMUNITY): Payer: Self-pay | Admitting: Emergency Medicine

## 2018-11-23 ENCOUNTER — Other Ambulatory Visit: Payer: Self-pay

## 2018-11-23 DIAGNOSIS — Z87891 Personal history of nicotine dependence: Secondary | ICD-10-CM | POA: Diagnosis not present

## 2018-11-23 DIAGNOSIS — I10 Essential (primary) hypertension: Secondary | ICD-10-CM | POA: Insufficient documentation

## 2018-11-23 DIAGNOSIS — Z79899 Other long term (current) drug therapy: Secondary | ICD-10-CM | POA: Diagnosis not present

## 2018-11-23 DIAGNOSIS — Z7982 Long term (current) use of aspirin: Secondary | ICD-10-CM | POA: Diagnosis not present

## 2018-11-23 DIAGNOSIS — C9510 Chronic leukemia of unspecified cell type not having achieved remission: Secondary | ICD-10-CM | POA: Insufficient documentation

## 2018-11-23 DIAGNOSIS — C9110 Chronic lymphocytic leukemia of B-cell type not having achieved remission: Secondary | ICD-10-CM

## 2018-11-23 LAB — COMPREHENSIVE METABOLIC PANEL
ALBUMIN: 3.5 g/dL (ref 3.5–5.0)
ALT: 13 U/L (ref 0–44)
AST: 21 U/L (ref 15–41)
Alkaline Phosphatase: 46 U/L (ref 38–126)
Anion gap: 4 — ABNORMAL LOW (ref 5–15)
BUN: 18 mg/dL (ref 8–23)
CO2: 25 mmol/L (ref 22–32)
Calcium: 9.1 mg/dL (ref 8.9–10.3)
Chloride: 113 mmol/L — ABNORMAL HIGH (ref 98–111)
Creatinine, Ser: 1.16 mg/dL (ref 0.61–1.24)
GFR calc Af Amer: >60 mL/min (ref >60)
GFR calc non Af Amer: >60 mL/min (ref >60)
Glucose, Bld: 94 mg/dL (ref 70–99)
Potassium: 4.3 mmol/L (ref 3.5–5.1)
Sodium: 142 mmol/L (ref 135–145)
Total Bilirubin: 0.7 mg/dL (ref 0.3–1.2)
Total Protein: 5.9 g/dL — ABNORMAL LOW (ref 6.5–8.1)

## 2018-11-23 LAB — CBC WITH DIFFERENTIAL/PLATELET
Abs Immature Granulocytes: 0.06 10*3/uL (ref 0.00–0.07)
Basophils Absolute: 0.1 10*3/uL (ref 0.0–0.1)
Basophils Relative: 0 %
Eosinophils Absolute: 0.1 10*3/uL (ref 0.0–0.5)
Eosinophils Relative: 0 %
HEMATOCRIT: 39.5 % (ref 39.0–52.0)
Hemoglobin: 12.1 g/dL — ABNORMAL LOW (ref 13.0–17.0)
Immature Granulocytes: 0 %
LYMPHS ABS: 47.3 10*3/uL — AB (ref 0.7–4.0)
Lymphocytes Relative: 91 %
MCH: 28.6 pg (ref 26.0–34.0)
MCHC: 30.6 g/dL (ref 30.0–36.0)
MCV: 93.4 fL (ref 80.0–100.0)
MONOS PCT: 3 %
Monocytes Absolute: 1.8 10*3/uL — ABNORMAL HIGH (ref 0.1–1.0)
Neutro Abs: 3.2 10*3/uL (ref 1.7–7.7)
Neutrophils Relative %: 6 %
Platelets: 128 10*3/uL — ABNORMAL LOW (ref 150–400)
RBC: 4.23 MIL/uL (ref 4.22–5.81)
RDW: 13.6 % (ref 11.5–15.5)
WBC: 52.4 10*3/uL (ref 4.0–10.5)
nRBC: 0 % (ref 0.0–0.2)

## 2018-11-23 NOTE — Discharge Instructions (Signed)
Please follow up with your oncologist (cancer doctor)

## 2018-11-23 NOTE — ED Notes (Addendum)
Pt endorses generalized weakness x 3 months

## 2018-11-23 NOTE — ED Notes (Signed)
Patient verbalizes understanding of discharge instructions. Opportunity for questioning and answers were provided. Armband removed by staff, pt discharged from ED home via POV with family. 

## 2018-11-23 NOTE — ED Provider Notes (Signed)
[facility] EMERGENCY DEPARTMENT Provider Note   CSN: [id number] Arrival date & time: 11/23/18  0272    History   Chief Complaint Chief Complaint  Patient presents with  . Abnormal Lab    WBC    HPI Javier [name] is a 78 y.o. male who presents with elevated white blood cell count.  Past medical history significant for CLL.  His wife is at bedside.  She states that yesterday they went to the [place] to get blood work done and they called her and left a message telling them that his white blood cell count was very elevated and he needed to come to the emergency department.  Patient states that he is been having generalized weakness and night sweats for months.  He goes to the cancer center here and has been to [facility] as well because his white blood cell count has been up and down and they have been told in the past that nothing was wrong.  They went to their cancer doctor this past week and are supposed to have a CT scan of his lymph nodes.  They felt that they were not getting enough answers quick enough so they went to the [place] to get blood work done who did not know that he had leukemia and so they sent him here afterwards.  Patient denies any current complaints other than generalized weakness.  He has been having problems with gassiness and vomiting at times.  He has had an extensive GI work-up in the past and is on PPIs.  He has not had a fever or any recent infectious symptoms.  He is not currently in any pain.     HPI  Past Medical History:  Diagnosis Date  . Diverticulosis   . Hyperlipidemia   . Hypertension   . Prediabetes   . Vitamin D deficiency     Patient Active Problem List   Diagnosis Date Noted  . BMI 24.0-24.9, adult 03/01/2018  . Anxiety 02/08/2018  . CLL (chronic lymphocytic leukemia) ([place]) 11/01/2016  . Encounter for Medicare annual wellness exam 09/11/2015  . GERD  02/09/2015  . Medication management 11/02/2013  . Hyperlipidemia   . Hypertension    . Prediabetes   . Vitamin D deficiency   . Diverticulosis     Past Surgical History:  Procedure Laterality Date  . NO PAST SURGERIES          Home Medications    Prior to Admission medications   Medication Sig Start Date End Date Taking? Authorizing Provider  aspirin EC 81 MG tablet Take 81 mg by mouth daily.   Yes [provider]  benazepril-hydrochlorthiazide (LOTENSIN HCT) 20-25 MG tablet TAKE 1 TABLET EVERY DAY FOR BLOOD PRESSURE Patient taking differently: Take 1 tablet by mouth daily.  09/27/18  Yes Unk Pinto, MD  Cholecalciferol (VITAMIN D3) 5000 units TABS Take 5,000 Units by mouth daily.   Yes [provider]  pravastatin (PRAVACHOL) 40 MG tablet TAKE 1 TABLET AT BEDTIME  FOR  CHOLESTEROL Patient taking differently: Take 40 mg by mouth at bedtime.  10/04/18  Yes Unk Pinto, MD  ALPRAZolam Duanne Moron) 0.5 MG tablet TAKE ONE-HALF TABLET BY MOUTH 3 TIMES DAILY BEFORE MEALS AND ONE TABLET DAILY AT BEDTIME Patient not taking: Take 0.25 mg TABLET BY MOUTH 3 TIMES DAILY BEFORE MEALS AND 0.5 mg TABLET DAILY AT BEDTIME 09/25/17   Vicie Mutters, PA-C  pantoprazole (PROTONIX) 40 MG tablet Take 1 tablet (40 mg total) by mouth  daily. Patient not taking: Reported on 11/23/2018 06/02/16   Sherwood Gambler, MD    Family History Family History  Problem Relation Age of Onset  . Stroke Mother   . Early death Father        Farm/tractor accident  . Diabetes Brother   . Cirrhosis Brother   . Colon cancer Paternal Grandfather     Social History Social History   Tobacco Use  . Smoking status: Former Smoker    Types: Cigarettes    Last attempt to quit: 01/17/2003    Years since quitting: 15.8  . Smokeless tobacco: Never Used  Substance Use Topics  . Alcohol use: Yes    Comment: very rare  . Drug use: No     Allergies   Penicillins and Ppd [tuberculin purified protein derivative]   Review of Systems Review of Systems  Constitutional: Positive for  fatigue. Negative for fever and unexpected weight change.  Respiratory: Negative for shortness of breath.   Cardiovascular: Negative for chest pain.  Gastrointestinal: Positive for vomiting. Negative for abdominal pain, constipation and nausea.       +burping  Neurological: Positive for weakness. Negative for headaches.     Physical Exam Updated Vital Signs BP (!) 165/75   Pulse 65   Temp 97.9 F (36.6 C) (Oral)   Resp 17   Ht 5\' 11"  (1.803 m)   Wt 83.9 kg   SpO2 97%   BMI 25.80 kg/m   Physical Exam Vitals signs and nursing note reviewed.  Constitutional:      General: He is not in acute distress.    Appearance: Normal appearance. He is well-developed. He is not ill-appearing.     Comments: Pleasant elderly male in NAD  HENT:     Head: Normocephalic and atraumatic.  Eyes:     General: No scleral icterus.       Right eye: No discharge.        Left eye: No discharge.     Conjunctiva/sclera: Conjunctivae normal.     Pupils: Pupils are equal, round, and reactive to light.  Neck:     Musculoskeletal: Normal range of motion.  Cardiovascular:     Rate and Rhythm: Normal rate and regular rhythm.  Pulmonary:     Effort: Pulmonary effort is normal. No respiratory distress.     Breath sounds: Normal breath sounds.  Abdominal:     General: There is no distension.     Palpations: Abdomen is soft.     Tenderness: There is no abdominal tenderness.  Skin:    General: Skin is warm and dry.  Neurological:     Mental Status: He is alert and oriented to person, place, and time.  Psychiatric:        Behavior: Behavior normal.      ED Treatments / Results  Labs (all labs ordered are listed, but only abnormal results are displayed) Labs Reviewed  CBC WITH DIFFERENTIAL/PLATELET - Abnormal; Notable for the following components:      Result Value   WBC 52.4 (*)    Hemoglobin 12.1 (*)    Platelets 128 (*)    Lymphs Abs 47.3 (*)    Monocytes Absolute 1.8 (*)    All other  components within normal limits  COMPREHENSIVE METABOLIC PANEL - Abnormal; Notable for the following components:   Chloride 113 (*)    Total Protein 5.9 (*)    Anion gap 4 (*)    All other components within normal limits  EKG None  Radiology No results found.  Procedures Procedures (including critical care time)  Medications Ordered in ED Medications - No data to display   Initial Impression / Assessment and Plan / ED Course  I have reviewed the triage vital signs and the nursing notes.  Pertinent labs & imaging results that were available during my care of the patient were reviewed by me and considered in my medical decision making (see chart for details).  78 year old male presents after the [place] told him to come to the emergency department due to an elevated white blood cell count.  He has CLL and has consistently has elevated white blood cell counts - it was 65 several days ago.  I think there is some miscommunication between him and his other providers.  They do not understand that the elevated white blood cell count goes along with his leukemia and they also do not understand that leukemia is a form cancer. His wife states "we've never heard that word before". They do have a follow-up with their oncologist in the next several weeks and are going to have the PET and CT scan done on March 4th.  His white blood cell count today is 52 which is consistent with prior values.  These results were faxed to the [facility] per family's request.  They were also given a copy of the results and encouraged to follow-up with their oncologist.  Final Clinical Impressions(s) / ED Diagnoses   Final diagnoses:  CLL (chronic lymphocytic leukemia) [facility])    ED Discharge Orders    None       Recardo Evangelist, PA-C 11/23/18 1551    Virgel Manifold, MD 11/24/18 [phone]

## 2018-11-23 NOTE — ED Triage Notes (Signed)
Pt. Stated, the [place] office called and said his WBC is up and needs to come here.

## 2018-11-24 LAB — PATHOLOGIST SMEAR REVIEW

## 2018-11-25 ENCOUNTER — Telehealth: Payer: Self-pay | Admitting: *Deleted

## 2018-11-25 NOTE — Telephone Encounter (Signed)
Patient's wife called to tell MD that husband had been in the ED since the last visit. She also wanted to know if patient needed to have the scan (PET) scheduled for 12/01/2018. Informed wife that patient definitely needs to have scan done so that the results can be reviewed at next appt with Dr. Irene Limbo on 3/9. Wife stated she did not know patient had appt on 3/9 with Dr. Irene Limbo. Reviewed times of both 3/4 and 3/9 appts with wife. She verbalized understanding.

## 2018-12-01 ENCOUNTER — Ambulatory Visit (HOSPITAL_COMMUNITY)
Admission: RE | Admit: 2018-12-01 | Discharge: 2018-12-01 | Disposition: A | Discharge status: Home / Self Care | Payer: Medicare HMO | Source: Ambulatory Visit | Attending: Hematology | Admitting: Hematology

## 2018-12-01 DIAGNOSIS — C9110 Chronic lymphocytic leukemia of B-cell type not having achieved remission: Secondary | ICD-10-CM | POA: Insufficient documentation

## 2018-12-01 DIAGNOSIS — C8590 Non-Hodgkin lymphoma, unspecified, unspecified site: Secondary | ICD-10-CM | POA: Diagnosis not present

## 2018-12-01 LAB — GLUCOSE, CAPILLARY: Glucose-Capillary: 93 mg/dL (ref 70–99)

## 2018-12-01 MED ORDER — FLUDEOXYGLUCOSE F - 18 (FDG) INJECTION
9.2000 | Freq: Once | INTRAVENOUS | Status: AC
  Administered 2018-12-01: 9.2 via INTRAVENOUS

## 2018-12-03 NOTE — Progress Notes (Signed)
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 male who has been referred to [place] by Dr .Unk Pinto, MD for evaluation and management of lymphocytosis concerning for a lymphoproliferative process.  Patient has a h/o HTN, HLD, Prediabetes who notes that he had significant abdominal discomfort with significant dyspeptic symptoms around aug 2017 and had about 20-25lbs weight loss and had an extensive GI workup including CT abd/pelvis 05/25/2016 - WNL, EGD which showed chronic gastritis (helicobacter pylori +ve) and colonscopy with some polyps. Capsule endoscopy was not approved by insurance. Patients symptoms improved after treatment of H.pylori and he notes that he has gained back about 6-7 lbs and is eating better.  Patients labs were noted to show some leucocytosis/Lymphocytosis for which he was referred to [place] for further evaluation.  Patient notes some nightsweats and weight loss (as noted above, possible alternative explanation). No fevers/chills. Has not noted any overt enlarged LN and CT abd in 04/2016 showed no hepatomegaly or splenomegaly and no abd/RP LNadenopathy.  Patient notes no other acute focal symptoms.   INTERVAL HISTORY  Mr Dolberry is here for management and evaluation of his CLL . The patient was lost to follow up between 11/06/16 and 11/19/18. The patient's last visit with [place] was on 11/19/18. He is accompanied today by his wife. The pt reports that he  is doing well overall.   The pt reports that he has been having fatigue and night sweats for the last 3 months. He notes that his fatigue affects his ability to do what he wants to do "in the morning," and is better later in the day. The pt notes that his forehead and chest are sweaty at night. He endorses heartburns as well, and takes mylanta which "helps some." The pt notes that his night sweats are "getting unbearable."  Of note since the patient's last visit, pt has had a PET/CT completed on 12/01/18 with results revealing Stable scattered small hypermetabolic thoracic lymph nodes, once again at Deauville 5 activity level. 2. No findings of involvement in the neck, abdomen/pelvis, or skeleton. 3. Prostatomegaly. 4. Aortic Atherosclerosis and Emphysema 5. Sigmoid colon diverticulosis.  Lab results (11/23/18) of CBC w/diff and CMP is as follows: all values are WNL except for WBC at 52.4k, HGB at 12.1, PLT at 128k, Lymphs abs at 47.3k, Monocytes abs at 1.8k, Chloride at 113, Total Protein at 5.9, Anion gap at 4. 11/19/18 LDH at 173  On review of systems, pt reports recent fatigue, night sweats, heartburn, and denies leg swelling, noticing any new lumps or bumps, unexpected weight loss, and any other symptoms.   MEDICAL HISTORY:  Past Medical History:  Diagnosis Date  . Diverticulosis   . Hyperlipidemia   . Hypertension   . Prediabetes   . Vitamin D deficiency   Helicobacter pylori +ve   SURGICAL HISTORY: Past Surgical History:  Procedure Laterality Date  . NO PAST SURGERIES      SOCIAL HISTORY: Social History   Socioeconomic History  . Marital status: Married  Spouse name: Not on file  . Number of children: 5  . Years of education: Not on file  . Highest education level: Not on file  Occupational History  . Occupation: retired  Scientific laboratory technician  . Financial resource strain: Not on file  . Food insecurity:    Worry: Not on file    Inability: Not on file  . Transportation needs:      Medical: Not on file    Non-medical: Not on file  Tobacco Use  . Smoking status: Former Smoker    Types: Cigarettes    Last attempt to quit: 01/17/2003    Years since quitting: 15.8  . Smokeless tobacco: Never Used  Substance and Sexual Activity  . Alcohol use: Yes    Comment: very rare  . Drug use: No  . Sexual activity: Not on file  Lifestyle  . Physical activity:    Days per week: Not on file    Minutes per session: Not on file  . Stress: Not on file  Relationships  . Social connections:    Talks on phone: Not on file    Gets together: Not on file    Attends religious service: Not on file    Active member of club or organization: Not on file    Attends meetings of clubs or organizations: Not on file    Relationship status: Not on file  . Intimate partner violence:    Fear of current or ex partner: Not on file    Emotionally abused: Not on file    Physically abused: Not on file    Forced sexual activity: Not on file  Other Topics Concern  . Not on file  Social History Narrative  . Not on file  Quit smoking >10 yrs ago, previously 1.5Packs per week. Started smoking in his teens. Retired. Worked as a Chief Financial Officer.   FAMILY HISTORY: Family History  Problem Relation Age of Onset  . Stroke Mother   . Early death Father        Farm/tractor accident  . Diabetes Brother   . Cirrhosis Brother   . Colon cancer Paternal Grandfather     ALLERGIES:  is allergic to penicillins and ppd [tuberculin purified protein derivative].  MEDICATIONS:  Current Outpatient Medications  Medication Sig Dispense Refill  . ALPRAZolam (XANAX) 0.5 MG tablet TAKE ONE-HALF TABLET BY MOUTH 3 TIMES DAILY BEFORE MEALS AND ONE TABLET DAILY AT BEDTIME (Patient not taking: Take 0.25 mg TABLET BY MOUTH 3 TIMES DAILY BEFORE MEALS AND 0.5 mg TABLET DAILY AT BEDTIME) 90 tablet 0  . aspirin EC 81 MG tablet Take 81 mg by mouth daily.    . benazepril-hydrochlorthiazide (LOTENSIN HCT) 20-25 MG tablet  TAKE 1 TABLET EVERY DAY FOR BLOOD PRESSURE (Patient taking differently: Take 1 tablet by mouth daily. ) 90 tablet 1  . Cholecalciferol (VITAMIN D3) 5000 units TABS Take 5,000 Units by mouth daily.    . pantoprazole (PROTONIX) 40 MG tablet Take 1 tablet (40 mg total) by mouth daily. (Patient not taking: Reported on 11/23/2018) 14 tablet 0  . pravastatin (PRAVACHOL) 40 MG tablet TAKE 1 TABLET AT BEDTIME  FOR  CHOLESTEROL (Patient taking differently: Take 40 mg by mouth at bedtime. ) 90 tablet 3   No current facility-administered medications for this visit.     REVIEW OF SYSTEMS:    A 10+ POINT REVIEW OF SYSTEMS WAS OBTAINED including neurology, dermatology, psychiatry, cardiac, respiratory, lymph, extremities, GI, GU, Musculoskeletal, constitutional, breasts, reproductive, HEENT.  All pertinent positives are noted in the HPI.  All others are negative.   PHYSICAL EXAMINATION: ECOG PERFORMANCE STATUS: 1 - Symptomatic but completely ambulatory  Vitals:   12/06/18 0928  BP: (!) 146/65  Pulse: 80  Resp: 18  Temp: 97.7 F (36.5 C)  SpO2: 99%   Filed Weights   12/06/18 0928  Weight: 185 lb 3.2 oz (84 kg)   .Body mass index is 25.83 kg/m.  GENERAL:alert, in no acute distress and comfortable SKIN: no acute rashes, no significant lesions EYES: conjunctiva are pink and non-injected, sclera anicteric OROPHARYNX: MMM, no exudates, no oropharyngeal erythema or ulceration NECK: supple, no JVD LYMPH:  no palpable lymphadenopathy in the cervical, axillary or inguinal regions LUNGS: clear to auscultation b/l with normal respiratory effort HEART: regular rate & rhythm ABDOMEN:  normoactive bowel sounds , non tender, not distended. No palpable hepatosplenomegaly.  Extremity: no pedal edema PSYCH: alert & oriented x 3 with fluent speech NEURO: no focal motor/sensory deficits   LABORATORY DATA:  I have reviewed the data as listed  . CBC Latest Ref Rng & Units 11/23/2018 11/19/2018 09/15/2018    WBC 4.0 - 10.5 K/uL 52.4(HH) 56.4(HH) 63.5(H)  Hemoglobin 13.0 - 17.0 g/dL 12.1(L) 13.5 14.0  Hematocrit 39.0 - 52.0 % 39.5 42.0 42.7  Platelets 150 - 400 K/uL 128(L) 155 157   . CBC    Component Value Date/Time   WBC 52.4 (HH) 11/23/2018 1101   RBC 4.23 11/23/2018 1101   HGB 12.1 (L) 11/23/2018 1101   HGB 13.5 11/19/2018 0928   HGB 14.5 10/21/2016 1324   HCT 39.5 11/23/2018 1101   HCT 43.8 10/21/2016 1324   PLT 128 (L) 11/23/2018 1101   PLT 155 11/19/2018 0928   PLT 157 10/21/2016 1324   MCV 93.4 11/23/2018 1101   MCV 90.9 10/21/2016 1324   MCH 28.6 11/23/2018 1101   MCHC 30.6 11/23/2018 1101   RDW 13.6 11/23/2018 1101   RDW 13.3 10/21/2016 1324   LYMPHSABS 47.3 (H) 11/23/2018 1101   LYMPHSABS 17.6 (H) 10/21/2016 1324   MONOABS 1.8 (H) 11/23/2018 1101   MONOABS 0.6 10/21/2016 1324   EOSABS 0.1 11/23/2018 1101   EOSABS 0.0 10/21/2016 1324   BASOSABS 0.1 11/23/2018 1101   BASOSABS 0.1 10/21/2016 1324     . CMP Latest Ref Rng & Units 11/23/2018 11/19/2018 09/15/2018  Glucose 70 - 99 mg/dL 94 109(H) 71  BUN 8 - 23 mg/dL '18 20 18  ' Creatinine 0.61 - 1.24 mg/dL 1.16 1.24 1.28(H)  Sodium 135 - 145 mmol/L 142 142 143  Potassium 3.5 - 5.1 mmol/L 4.3 4.1 4.3  Chloride 98 - 111 mmol/L 113(H) 107 107  CO2 22 - 32 mmol/L '25 26 29  ' Calcium 8.9 - 10.3 mg/dL 9.1 9.9 10.0  Total Protein 6.5 - 8.1 g/dL 5.9(L) 7.1 6.8  Total Bilirubin 0.3 - 1.2 mg/dL 0.7 0.7 0.4  Alkaline Phos 38 - 126 U/L 46 66 -  AST 15 - 41 U/L '21 17 15  ' ALT 0 - 44 U/L '13 13 14   ' . Lab Results  Component Value Date   LDH 173 11/19/2018         RADIOGRAPHIC STUDIES: I have personally reviewed the radiological images as listed and agreed with the findings in the report. Nm Pet Image Restag (ps) Skull Base To Thigh  Result Date: 12/01/2018 CLINICAL DATA:  Subsequent treatment strategy for non-Hodgkin's lymphoma, CLL. EXAM: NUCLEAR MEDICINE PET SKULL BASE TO THIGH TECHNIQUE: 9.2 mCi F-18  FDG was injected  intravenously. Full-ring PET imaging was performed from the skull base to thigh after the radiotracer. CT data was obtained and used for attenuation correction and anatomic localization. Fasting blood glucose: 93 mg/dl COMPARISON:  10/31/2016 FINDINGS: Mediastinal blood pool activity: SUV max 2.9 Background hepatic activity: 4.2 NECK: No hypermetabolic or pathologically enlarged adenopathy. Incidental CT findings: none CHEST: Scattered small hypermetabolic paratracheal, AP window, prevascular, bilateral hilar, bilateral infrahilar, and subcarinal lymph nodes. A right lower paratracheal lymph node measuring 0.7 cm in short axis on image 67/4 is stable in size and has a maximum SUV of 10.1 (formerly 9.6). Index subcarinal lymph node measuring 0.8 cm in short axis on image 75/4 with maximum SUV 7.2, formerly 7.9. The remaining lymph nodes are likewise similar to prior and are generally Deauville 5. Incidental CT findings: Atherosclerotic calcification of the aortic arch and branch vessels. Paraseptal emphysema. ABDOMEN/PELVIS: No significant abnormal hypermetabolic activity in this region. No splenomegaly or abnormal splenic activity. Incidental CT findings: Aortoiliac atherosclerotic vascular disease. Prostatomegaly. Mild sigmoid colon diverticulosis. SKELETON: No significant abnormal hypermetabolic activity in this region. Incidental CT findings: none IMPRESSION: 1. Stable scattered small hypermetabolic thoracic lymph nodes, once again at Deauville 5 activity level. 2. No findings of involvement in the neck, abdomen/pelvis, or skeleton. 3. Prostatomegaly. 4. Aortic Atherosclerosis (ICD10-I70.0) and Emphysema (ICD10-J43.9). 5. Sigmoid colon diverticulosis. Electronically Signed   By: Van Clines M.D.   On: 12/01/2018 14:19    ASSESSMENT & PLAN:   78 y.o. 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 FISH panel showed p53 (17p13) deletion that would be  consistent with this diagnosis . It often represents also be more aggressive form of CLL Differential diagnosis includes - CD5 neg CLL vs splenic lymphoma vs Marginal Zone lymphoma vs LPL (lymphoplasmacytic lymphoma) LDH WNL suggests against a high grade process. Currently no anemia or thrombocytopenia noted. Now with drenching night sweats and fatigue, no fevers/chills.  10/31/16 PET/CT scan shows some small hypermetabolic nodes in the thorax including bilateral hilar paratracheal) esophageal. These would be difficult to biopsy.  Lost to follow up between 11/06/16 and 11/19/18  PLAN -Discussed pt labwork from 11/19/18; WBC at 52.4k, HGB at 12.1, PLT at 128k, LDH normal at 173 -Discussed the 12/01/18 PET/CT which revealed Stable scattered small hypermetabolic thoracic lymph nodes, once again at Deauville 5 activity level. 2. No findings of involvement in the neck, abdomen/pelvis, or skeleton. 3. Prostatomegaly. 4. Aortic Atherosclerosis and Emphysema 5. Sigmoid colon diverticulosis -Pt endorses fatigue and night sweats and I discussed that is one reason to consider treatment -We discussed again that that the p53 mutation suggests possibly more aggressive course of his CLL. -Discussed that 17p mutations, which the pt has, can be less responsive to traditional chemotherapies, but can be more responsive to targeted therapies like Venetoclax or Ibrutinib  -Discussed the risks associated with these two targeted therapies, and my recommendation to begin Venetoclax, and the intent of treatment being to lower his WBC and improve his constitutional symptoms, not curative. The pt and his wife agree with this plan. -Will begin low dose Venetoclax with gradual dose increase. -Will order acid suppressant that does not interact with Venetoclax -Discussed that I recommend following up with Dr. Melford Aase further regarding dyspepsia and feeling of impending doom, and rule out cardiologic etiology -Recommend following up  with PCP for prostatomegaly management and recommendations.   2) . Patient Active Problem List   Diagnosis Date Noted  .  BMI 24.0-24.9, adult 03/01/2018  . Anxiety 02/08/2018  . CLL (chronic lymphocytic leukemia) ([place]) 11/01/2016  . Encounter for Medicare annual wellness exam 09/11/2015  . GERD  02/09/2015  . Medication management 11/02/2013  . Hyperlipidemia   . Hypertension   . Prediabetes   . Vitamin D deficiency   . Diverticulosis    -f/u with PCP for management of other medical co-morbids  3) H.pylori gastritis -f/u with GI to ensure eradication of h.pylori post treatment.   Will call to setup appointment with start of Venetoclax   All of the patients questions were answered with apparent satisfaction. The patient knows to call the clinic with any problems, questions or concerns.  The total time spent in the appt was 30 minutes and more than 50% was on counseling and direct patient cares.    Sullivan Lone MD MS AAHIVMS [facility] Hematology/Oncology Physician [facility]  (Office):       [phone] (Work cell):  [phone] (Fax):           [phone]  12/06/2018 10:30 AM  I, Baldwin Jamaica, am acting as a scribe for Dr. Sullivan Lone.  .I have reviewed the above documentation for accuracy and completeness, and I agree with the above. Brunetta Genera MD

## 2018-12-06 ENCOUNTER — Telehealth: Payer: Self-pay

## 2018-12-06 ENCOUNTER — Inpatient Hospital Stay: Payer: Medicare HMO | Attending: Hematology | Admitting: Hematology

## 2018-12-06 ENCOUNTER — Telehealth: Payer: Self-pay | Admitting: Hematology

## 2018-12-06 VITALS — BP 146/65 | HR 80 | Temp 97.7°F | Resp 18 | Ht 71.0 in | Wt 185.2 lb

## 2018-12-06 DIAGNOSIS — D479 Neoplasm of uncertain behavior of lymphoid, hematopoietic and related tissue, unspecified: Secondary | ICD-10-CM | POA: Diagnosis not present

## 2018-12-06 DIAGNOSIS — N400 Enlarged prostate without lower urinary tract symptoms: Secondary | ICD-10-CM

## 2018-12-06 DIAGNOSIS — K5730 Diverticulosis of large intestine without perforation or abscess without bleeding: Secondary | ICD-10-CM

## 2018-12-06 DIAGNOSIS — J439 Emphysema, unspecified: Secondary | ICD-10-CM | POA: Diagnosis not present

## 2018-12-06 DIAGNOSIS — R61 Generalized hyperhidrosis: Secondary | ICD-10-CM | POA: Diagnosis not present

## 2018-12-06 DIAGNOSIS — Z79899 Other long term (current) drug therapy: Secondary | ICD-10-CM | POA: Diagnosis not present

## 2018-12-06 DIAGNOSIS — C9110 Chronic lymphocytic leukemia of B-cell type not having achieved remission: Secondary | ICD-10-CM

## 2018-12-06 NOTE — Telephone Encounter (Signed)
Per 3/9 los Will call to setup appointment with start of Venetoclax.

## 2018-12-06 NOTE — Telephone Encounter (Signed)
Oral Oncology Patient Advocate Encounter  Received notification from [facility] that prior authorization for Venclexta is required.  PA submitted on CoverMyMeds Key [id number] Status is pending  Oral Oncology Clinic will continue to follow.  [place] Patient [place] Phone [phone] Fax [phone] 12/06/2018    2:50 PM

## 2018-12-07 ENCOUNTER — Telehealth: Payer: Self-pay | Admitting: Pharmacist

## 2018-12-07 DIAGNOSIS — C9110 Chronic lymphocytic leukemia of B-cell type not having achieved remission: Secondary | ICD-10-CM

## 2018-12-07 NOTE — Telephone Encounter (Signed)
Oral Oncology Pharmacist [employer] authorization for Lynita Lombard has been denied by [facility] part D as [employer]'s medical necessity guidelines state that Venclexta must be used in combination with Gazyva when used for frontline therapy for patients with CLL.  Appeal packet including letter of medical necessity and clinical documentation have been faxed to [facility] appeal department at [phone] Appeal has been marked as expedited/urgent  This encounter will continue to be updated until final determination.  Johny Drilling, PharmD, BCPS, BCOP  12/07/2018 12:19 PM Oral Oncology Clinic [phone]

## 2018-12-07 NOTE — Telephone Encounter (Signed)
Oral Oncology Pharmacist Encounter  Received new referral for Venclexta (venetoclax) for the treatment of chronic lymphocytic leukemia with p53 mutation and 17p deletion, planned duration until disease progression or unacceptable toxicity.  Labs from 11/23/18 assessed, OK for treatment initiation.  Venclexta is planned to be dosed on up-titration schedule for CLL Week 1: 25m by mouth once daily with food and water Week 2: 549mby mouth once daily with food and water Week 3: 10032my mouth once daily with food and water Week 4: 200m20m mouth once daily with food and water Week 5: 400mg70mmouth once daily with food and water  400mg 105m daily is target dose of Venclexta for CLL  Tumor lysis risk assessment: medium tumor burden: Venclexta can be initiated as an outpatient, it is noted that patient with CrCl < 80 mL/min may be at increased risk of TLS  All lymph nodes < 5cm  ALC 47.3 k/uL  11/23/18 SCr=1.16, est CrCl ~ 60 mL/min  2-3 days prior to Venclexta initiation, patient will start allopurinol and oral hydration at 1.5-2L/day Additional IV hydration may be given on dose initiation and some dose ramp up days Blood chemistry monitoring (complete metabolic panel, LDH, uric acid, phosphorus) will occur for 1st dose of 20mg a31m0mg: p56mose, 6-8 hours post dose administration, and 24 hours  post dose administration Blood chemistry monitoring will occur pre-dose for subsequent ramp up doses CBC with diff will be checked at least once weekly  Current medication list in Epic reviewed, no DDIs with Venclexta identified:  [facility] approval once received from MD.  Oral Oncology Clinic will continue to follow for insurance authorization, copayment issues, initial counseling and start date.  Jesse MaJohny Drilling, BCPS, BCOP  12/07/2018 8:31 AM Oral Oncology Clinic [phone]

## 2018-12-09 ENCOUNTER — Telehealth: Payer: Self-pay | Admitting: *Deleted

## 2018-12-09 NOTE — Telephone Encounter (Signed)
Oral Oncology Patient Advocate Encounter  Prior Authorization for Javier [name] has been approved.     Effective dates: 12/09/18 through 09/29/19  Oral Oncology Clinic will continue to follow.   [place] Patient [name] Phone [phone] Fax [phone] 12/09/2018    3:36 PM

## 2018-12-09 NOTE — Telephone Encounter (Signed)
Oral Oncology Pharmacist Encounter  Received notification from [facility] Part D appeals department that appeal for Venclexta monotherapy has been denied and they are upholding their original decision to deny coverage of Venclexta if used as monotherapy for the front line treatment of CLL.  Discussed options for medication acquisition with MD which include:   agreeing to add Dyann Kief to treatment regimen and submitting new insurance authorization for Venclexta stating it will be used in combination, or   considering the patient functionally uninsured for Venclexta and trying to obtain medication through compassionate use program through manufacturer.  MD agreeable to the addition of Gazyva to treatment regimen. He will still need to discuss treatment change with patient.  New PA submitted on CoverMyMeds Key [id number] Status is pending  This encounter will continue to be updated until final determination.  Johny Drilling, PharmD, BCPS, BCOP  12/09/2018 12:19 PM Oral Oncology Clinic [phone]

## 2018-12-09 NOTE — Telephone Encounter (Signed)
"  Javier [name]'s wife [name] calling because no one has called [place] yet about medication for Multiple Myeloma yet.   Is he going to get it?  Call home number [phone] or cell [phone]."

## 2018-12-10 ENCOUNTER — Other Ambulatory Visit: Payer: Self-pay | Admitting: Hematology

## 2018-12-10 ENCOUNTER — Telehealth: Payer: Self-pay | Admitting: Pharmacist

## 2018-12-10 DIAGNOSIS — C9110 Chronic lymphocytic leukemia of B-cell type not having achieved remission: Secondary | ICD-10-CM

## 2018-12-10 MED ORDER — VENETOCLAX 10 & 50 & 100 MG PO TBPK: ORAL_TABLET | ORAL | 0 refills | Status: DC

## 2018-12-10 NOTE — Telephone Encounter (Signed)
Oral Oncology Pharmacist Encounter  Prescription for Venclexta starter pack has been e-scribed to the [facility] outpatient pharmacy.  Johny Drilling, PharmD, BCPS, BCOP  12/10/2018 10:22 AM Oral Oncology Clinic [phone]

## 2018-12-13 ENCOUNTER — Encounter: Payer: Self-pay | Admitting: Internal Medicine

## 2018-12-13 ENCOUNTER — Telehealth: Payer: Self-pay

## 2018-12-13 MED ORDER — ALLOPURINOL 100 MG PO TABS: 100.0000 mg | ORAL_TABLET | Freq: Two times a day (BID) | ORAL | 2 refills | Status: DC

## 2018-12-13 MED FILL — VENCLEXTA STARTING PACK: 10 & 50 & 1 | 28 days supply | Qty: 42 | Fill #0

## 2018-12-13 NOTE — Telephone Encounter (Signed)
Oral Oncology Patient Advocate Encounter  I was successful at securing a grant with [facility] for $8,000. This will keep the out of pocket expense for Venclexta at $0. The grant information is as follows and has been shared with [place].  Approval dates: 11/13/18-11/13/19 ID: [id number] Group: [id number] BIN: [id number] PCN: [id number]  The patient and his wife are aware that I was getting the grant and asked me to call back tomorrow with grant information since she has an appointment this afternoon.   [place] Patient [place] Phone [phone] Fax [phone] 12/13/2018   12:29 PM

## 2018-12-13 NOTE — Telephone Encounter (Signed)
Oral Chemotherapy Pharmacist Encounter  I spoke with patient and wife, [name], for overview of: Venclexta (venetoclax) for the treatment of chronic lymphocytic leukemia with p53 mutation and 17p deletion, planned duration until disease progression or unacceptable toxicity.  Counseled patient on administration, dosing, side effects, monitoring, drug-food interactions, safe handling, storage, and disposal.  Patient will take Venclexta 24m tablets, 1 tablet by mouth once daily with food and water for 7 days. The 1st dose of Venclexta 239mtablets will be administered in the office based on tumor lysis syndrome (TLS) risk assessment to be moderate risk. Baseline labs will be assessed and the 1st dose will be administered. CBC, uric acid, and electrolytes will be monitored at 6 and 24 hours post dose administration per protocol. Hydration will be administered oral and IV.  Venclexta start date: TBD, appointment planning still taking place  Patient was instructed to start allopurinol 10065mvery 12 hours, starting 3 days prior to Venclexta initiation. [facility]urin[facility] order from MD.  Patient was instructed to increase fluid intake to 1.5 - 2L of water per day starting [facility]. Patient already maintains this amount of water intake daily.  Patient will take Venclexta 14m47mblets, 1 tablet by mouth once daily with food and water for 7 days. The 1st dose of Venclexta 14mg28mlets will be administered outpatient based on TLS risk assessment. Baseline labs will be assessed and the 1st dose will be administered. CBC, uric acid, and electrolytes will be monitored at 6 and 24 hours post dose administration per protocol. Hydration will be administered oral and IV.  Subsequent ramp-up doses of 100mg 2my x 7 days, 200mg d61m x 7 days, and 400mg da12m(target maintenance dose) will be monitored per  protocol.  Adverse effects include but are not limited to: TLS, decreased blood counts, electrolyte abnormalities, diarrhea, nausea, fatigue, arthralgias/myalgias, and upper respiratory tract infection.    Patient will contact office it if nausea develops.   Patient will obtain anti diarrheal and alert the office of 4 or more loose stools above baseline.  Reviewed with patient importance of keeping a medication schedule and plan for any missed doses.  Mr. and Mrs. Kendall voiced understanding and appreciation.   All questions answered. Medication reconciliation performed and medication/allergy list updated.  Test claim at the pharmacy revealed copayment ~$900 for 1st fill. We anticipate a larger copayment due for the 2nd fill due to increase in total milligrams daily. This is not affordable to the patient.  Oral oncology patient advocate will work with the [facility] to secure fMiguel Barrerar the [facility] Venclexta will be dispensed to patient prior to start date.  They are reminded to wait until after lab check on initiation day to actually start VenclextBeavertration.  They know to call the office with questions or concerns. Oral Oncology Clinic will continue to follow.  Jesse MaJohny Drilling, BCPS, BCOP  12/13/2018   9:46 AM Oral Oncology Clinic [phone]

## 2018-12-14 ENCOUNTER — Telehealth: Payer: Self-pay | Admitting: *Deleted

## 2018-12-14 NOTE — Telephone Encounter (Signed)
Patient/wife wants to know when to start Allopurinol.Contacted patient/wifeto inform that Dr. Irene Limbo, once treatment begins begin Allopurinol 2 days before Day 1 of treatment. Verbalized understanding.

## 2018-12-14 NOTE — Telephone Encounter (Signed)
Oral Oncology Patient Advocate Encounter  Confirmed with Javier [name] that Venclexta was shipped on 12/13/18 with a $0 copay using [employer] grant.  Javier [name] verbalized understanding that Javier [name] is not to take the medicine. He is to bring the medicine to his next appointment.  [place] [facility] [place] Phone [phone] Fax [phone] 12/14/2018   9:28 AM

## 2018-12-16 ENCOUNTER — Other Ambulatory Visit: Payer: Self-pay | Admitting: Pharmacist

## 2018-12-16 ENCOUNTER — Telehealth: Payer: Self-pay | Admitting: Hematology

## 2018-12-16 DIAGNOSIS — C9110 Chronic lymphocytic leukemia of B-cell type not having achieved remission: Secondary | ICD-10-CM

## 2018-12-16 NOTE — Telephone Encounter (Signed)
Scheduled appt per 3/13 sch message - pt wife is aware of appts

## 2018-12-16 NOTE — Progress Notes (Signed)
Oral Oncology Pharmacist Encounter  Appointments for Venclexta initiation have been made. Labs have been entered for the 1st 5 week ramp up period, including CBC, CMET, phosphorus, LDH, and uric acid.  Johny Drilling, PharmD, BCPS, BCOP  12/16/2018 11:35 AM Oral Oncology Clinic [phone]

## 2018-12-17 ENCOUNTER — Telehealth: Payer: Self-pay | Admitting: *Deleted

## 2018-12-17 NOTE — Telephone Encounter (Signed)
Reviewed husband's medications with Ms. Cafaro. Per Dr. Irene Limbo, begin Allopurinol today in preparation for Monday start of Venclexta on Monday. AS Monday is day 1 of Venclexta, wait to take it until he is here. She verbalized understanding of directions.

## 2018-12-20 ENCOUNTER — Encounter: Payer: Self-pay | Admitting: Internal Medicine

## 2018-12-20 ENCOUNTER — Inpatient Hospital Stay: Payer: Medicare HMO

## 2018-12-20 ENCOUNTER — Other Ambulatory Visit: Payer: Self-pay

## 2018-12-20 VITALS — BP 161/83 | HR 68 | Temp 97.9°F | Resp 18

## 2018-12-20 DIAGNOSIS — J439 Emphysema, unspecified: Secondary | ICD-10-CM | POA: Diagnosis not present

## 2018-12-20 DIAGNOSIS — K5730 Diverticulosis of large intestine without perforation or abscess without bleeding: Secondary | ICD-10-CM | POA: Diagnosis not present

## 2018-12-20 DIAGNOSIS — D479 Neoplasm of uncertain behavior of lymphoid, hematopoietic and related tissue, unspecified: Secondary | ICD-10-CM | POA: Diagnosis not present

## 2018-12-20 DIAGNOSIS — C9110 Chronic lymphocytic leukemia of B-cell type not having achieved remission: Secondary | ICD-10-CM

## 2018-12-20 DIAGNOSIS — Z79899 Other long term (current) drug therapy: Secondary | ICD-10-CM | POA: Diagnosis not present

## 2018-12-20 DIAGNOSIS — N400 Enlarged prostate without lower urinary tract symptoms: Secondary | ICD-10-CM | POA: Diagnosis not present

## 2018-12-20 DIAGNOSIS — R61 Generalized hyperhidrosis: Secondary | ICD-10-CM | POA: Diagnosis not present

## 2018-12-20 LAB — CBC WITH DIFFERENTIAL/PLATELET
Abs Immature Granulocytes: 0.06 10*3/uL (ref 0.00–0.07)
BASOS ABS: 0.1 10*3/uL (ref 0.0–0.1)
Basophils Relative: 0 %
Eosinophils Absolute: 0.1 10*3/uL (ref 0.0–0.5)
Eosinophils Relative: 0 %
HCT: 40.6 % (ref 39.0–52.0)
Hemoglobin: 12.6 g/dL — ABNORMAL LOW (ref 13.0–17.0)
Immature Granulocytes: 0 %
Lymphocytes Relative: 94 %
Lymphs Abs: 56.5 10*3/uL — ABNORMAL HIGH (ref 0.7–4.0)
MCH: 29 pg (ref 26.0–34.0)
MCHC: 31 g/dL (ref 30.0–36.0)
MCV: 93.3 fL (ref 80.0–100.0)
Monocytes Absolute: 0.7 10*3/uL (ref 0.1–1.0)
Monocytes Relative: 1 %
NRBC: 0 % (ref 0.0–0.2)
Neutro Abs: 2.7 10*3/uL (ref 1.7–7.7)
Neutrophils Relative %: 5 %
Platelets: 138 10*3/uL — ABNORMAL LOW (ref 150–400)
RBC: 4.35 MIL/uL (ref 4.22–5.81)
RDW: 14 % (ref 11.5–15.5)
WBC: 60.2 10*3/uL (ref 4.0–10.5)

## 2018-12-20 LAB — COMPREHENSIVE METABOLIC PANEL
ALK PHOS: 70 U/L (ref 38–126)
ALT: 16 U/L (ref 0–44)
ALT: 17 U/L (ref 0–44)
AST: 16 U/L (ref 15–41)
AST: 16 U/L (ref 15–41)
Albumin: 3.9 g/dL (ref 3.5–5.0)
Albumin: 3.9 g/dL (ref 3.5–5.0)
Alkaline Phosphatase: 61 U/L (ref 38–126)
Anion gap: 12 (ref 5–15)
Anion gap: 8 (ref 5–15)
BUN: 16 mg/dL (ref 8–23)
BUN: 15 mg/dL (ref 8–23)
CALCIUM: 8.6 mg/dL — AB (ref 8.9–10.3)
CO2: 22 mmol/L (ref 22–32)
CO2: 25 mmol/L (ref 22–32)
CREATININE: 1.17 mg/dL (ref 0.61–1.24)
Calcium: 9.3 mg/dL (ref 8.9–10.3)
Chloride: 109 mmol/L (ref 98–111)
Chloride: 110 mmol/L (ref 98–111)
Creatinine, Ser: 1.14 mg/dL (ref 0.61–1.24)
GFR calc Af Amer: >60 mL/min (ref >60)
GFR calc non Af Amer: 60 mL/min — ABNORMAL LOW (ref >60)
GFR calc non Af Amer: >60 mL/min (ref >60)
Glucose, Bld: 106 mg/dL — ABNORMAL HIGH (ref 70–99)
Glucose, Bld: 90 mg/dL (ref 70–99)
Potassium: 3.7 mmol/L (ref 3.5–5.1)
Potassium: 3.8 mmol/L (ref 3.5–5.1)
Sodium: 143 mmol/L (ref 135–145)
Sodium: 143 mmol/L (ref 135–145)
TOTAL PROTEIN: 6.9 g/dL (ref 6.5–8.1)
Total Bilirubin: 0.4 mg/dL (ref 0.3–1.2)
Total Bilirubin: 0.3 mg/dL (ref 0.3–1.2)
Total Protein: 6.9 g/dL (ref 6.5–8.1)

## 2018-12-20 LAB — LACTATE DEHYDROGENASE
LDH: 182 U/L (ref 98–192)
LDH: 193 U/L — ABNORMAL HIGH (ref 98–192)

## 2018-12-20 LAB — URIC ACID
Uric Acid, Serum: 4.3 mg/dL (ref 3.7–8.6)
Uric Acid, Serum: 4.1 mg/dL (ref 3.7–8.6)

## 2018-12-20 LAB — PHOSPHORUS
Phosphorus: 2.5 mg/dL (ref 2.5–4.6)
Phosphorus: 2.5 mg/dL (ref 2.5–4.6)

## 2018-12-20 MED ORDER — SODIUM CHLORIDE 0.9 % IV SOLN
Freq: Once | INTRAVENOUS | Status: AC
  Administered 2018-12-20: 09:00:00 via INTRAVENOUS
  Filled 2018-12-20: qty 250

## 2018-12-20 NOTE — Progress Notes (Signed)
Patient took first dose of Venclexta PO in infusion with RN. Receiving one hour hydration fluids totaling 1L of normal saline.

## 2018-12-20 NOTE — Patient Instructions (Signed)
Venetoclax oral tablets What is this medicine? VENETOCLAX (ven et oh klax) is a medicine that targets proteins in cancer cells and stops the cancer cells from growing. It is used to treat chronic lymphocytic leukemia, small lymphocytic lymphoma, and acute myelogenous leukemia. This medicine may be used for other purposes; ask your health care provider or pharmacist if you have questions. COMMON BRAND NAME(S): Venclexta What should I tell my health care provider before I take this medicine? They need to know if you have any of these conditions: -gout -high levels of uric acid in the blood -kidney disease -liver disease -low or high levels of potassium, phosphorus, or calcium in the blood -scheduled to receive a vaccine -an unusual or allergic reaction to venetoclax, other medicines, foods, dyes, or preservatives -pregnant or trying to get pregnant -breast-feeding How should I use this medicine? Take this medicine by mouth with food and a glass of water. Follow the directions on the prescription label. Do not cut, crush, or chew this medicine. Do not take with grapefruit juice or eat [place] oranges or starfruit. Take your medicine at regular intervals. Do not take it more often than directed. Do not stop taking except on your doctor's advice. A special MedGuide will be given to you by the pharmacist with each prescription and refill. Be sure to read this information carefully each time. Talk to your pediatrician regarding the use of this medicine in children. Special care may be needed. Overdosage: If you think you have taken too much of this medicine contact a poison control center or emergency room at once. NOTE: This medicine is only for you. Do not share this medicine with others. What if I miss a dose? If you miss a dose, take it as soon as you can. If your next dose is to be taken in less than 16 hours, then do not take the missed dose. Take the next dose at your regular time. Do not take  double or extra doses. If you vomit after a dose, do not take another dose; take the next day's dose at the usual time. What may interact with this medicine? This medicine may interact with the following medications: -bosentan -calcium channel blockers like diltiazem and verapamil -captopril -carvedilol -certain antibiotics like azithromycin, erythromycin, and clarithromycin -certain medicines for fungal infections like fluconazole, itraconazole, ketoconazole, posaconazole, and voriconazole -certain medicines for irregular heart beat like amiodarone, dronedarone, and quinidine -certain medicines for seizures like carbamazepine and phenytoin -ciprofloxacin -conivaptan -cyclosporine -digoxin -efavirenz -etravirine -everolimus -felodipine -grapefruit products, [place] oranges, or starfruit -indinavir -live virus vaccines -lopinavir -modafinil -nafcillin -quercetin -ranolazine -rifampin -ritonavir -sirolimus -St. John's wort -telaprevir -ticagrelor -warfarin This list may not describe all possible interactions. Give your health care provider a list of all the medicines, herbs, non-prescription drugs, or dietary supplements you use. Also tell them if you smoke, drink alcohol, or use illegal drugs. Some items may interact with your medicine. What should I watch for while using this medicine? This medicine can cause serious reactions. To reduce your risk you will need to take other medicine(s) before treatment with this medicine. Take your medicine as directed. You may need blood work done while you are taking this medicine. Drink plenty of fluids while you are taking this medicine. Call your doctor or health care professional for advice if you get a fever, chills or sore throat, or other symptoms of a cold or flu. Do not treat yourself. This drug decreases your body's ability to fight infections. Try to avoid  being around people who are sick. Do not become pregnant while taking this  medicine or for 30 days after stopping it. Women should inform their doctor if they wish to become pregnant or think they might be pregnant. There is a potential for serious side effects to an unborn child. Talk to your health care professional or pharmacist for more information. Do not breast-feed an infant while taking this medicine. This may interfere with the ability to father a child. You should talk to your doctor or health care professional if you are concerned about your fertility. What side effects may I notice from receiving this medicine? Side effects that you should report to your doctor or health care professional as soon as possible: -allergic reactions like skin rash, itching or hives, swelling of the face, lips, or tongue -low blood counts - this medicine may decrease the number of white blood cells, red blood cells and platelets. You may be at increased risk for infections and bleeding -signs and symptoms of infection like fever or chills; cough; sore throat; or pain when urinating -signs and symptoms of tumor lysis syndrome like nausea, vomiting, confusion, shortness of breath, seizures, irregular heartbeat, dark urine, tiredness, muscle pain, and/or joint pain Side effects that usually do not require medical attention (report to your doctor or health care professional if they continue or are bothersome): -back pain -constipation -diarrhea -dizziness -headache -stomach pain -swelling of the ankles, feet, hands This list may not describe all possible side effects. Call your doctor for medical advice about side effects. You may report side effects to FDA at 1-800-FDA-1088. Where should I keep my medicine? Keep out of the reach of children. Store below 30 degrees C (86 degrees F). Keep this medicine in the original package during the first 4 weeks of treatment. Throw away any unused medicine after the expiration date. NOTE: This sheet is a summary. It may not cover all possible  information. If you have questions about this medicine, talk to your doctor, pharmacist, or health care provider.  2019 Elsevier/Gold Standard (2018-04-29 15:07:49)

## 2018-12-21 ENCOUNTER — Telehealth: Payer: Self-pay | Admitting: Pharmacist

## 2018-12-21 ENCOUNTER — Other Ambulatory Visit: Payer: Self-pay

## 2018-12-21 ENCOUNTER — Inpatient Hospital Stay: Payer: Medicare HMO

## 2018-12-21 DIAGNOSIS — C9110 Chronic lymphocytic leukemia of B-cell type not having achieved remission: Secondary | ICD-10-CM

## 2018-12-21 DIAGNOSIS — K5730 Diverticulosis of large intestine without perforation or abscess without bleeding: Secondary | ICD-10-CM | POA: Diagnosis not present

## 2018-12-21 DIAGNOSIS — Z79899 Other long term (current) drug therapy: Secondary | ICD-10-CM | POA: Diagnosis not present

## 2018-12-21 DIAGNOSIS — D479 Neoplasm of uncertain behavior of lymphoid, hematopoietic and related tissue, unspecified: Secondary | ICD-10-CM | POA: Diagnosis not present

## 2018-12-21 DIAGNOSIS — N400 Enlarged prostate without lower urinary tract symptoms: Secondary | ICD-10-CM | POA: Diagnosis not present

## 2018-12-21 DIAGNOSIS — J439 Emphysema, unspecified: Secondary | ICD-10-CM | POA: Diagnosis not present

## 2018-12-21 DIAGNOSIS — R61 Generalized hyperhidrosis: Secondary | ICD-10-CM | POA: Diagnosis not present

## 2018-12-21 LAB — CBC WITH DIFFERENTIAL/PLATELET
Abs Immature Granulocytes: 0.06 10*3/uL (ref 0.00–0.07)
Basophils Absolute: 0 10*3/uL (ref 0.0–0.1)
Basophils Relative: 0 %
Eosinophils Absolute: 0.1 10*3/uL (ref 0.0–0.5)
Eosinophils Relative: 0 %
HEMATOCRIT: 40.1 % (ref 39.0–52.0)
HEMOGLOBIN: 12.4 g/dL — AB (ref 13.0–17.0)
Immature Granulocytes: 0 %
LYMPHS PCT: 94 %
Lymphs Abs: 55.3 10*3/uL — ABNORMAL HIGH (ref 0.7–4.0)
MCH: 29.2 pg (ref 26.0–34.0)
MCHC: 30.9 g/dL (ref 30.0–36.0)
MCV: 94.6 fL (ref 80.0–100.0)
Monocytes Absolute: 0.8 10*3/uL (ref 0.1–1.0)
Monocytes Relative: 1 %
Neutro Abs: 2.9 10*3/uL (ref 1.7–7.7)
Neutrophils Relative %: 5 %
Platelets: 150 10*3/uL (ref 150–400)
RBC: 4.24 MIL/uL (ref 4.22–5.81)
RDW: 14.2 % (ref 11.5–15.5)
WBC: 59.3 10*3/uL (ref 4.0–10.5)
nRBC: 0 % (ref 0.0–0.2)

## 2018-12-21 LAB — COMPREHENSIVE METABOLIC PANEL
ALK PHOS: 60 U/L (ref 38–126)
ALT: 15 U/L (ref 0–44)
AST: 17 U/L (ref 15–41)
Albumin: 3.8 g/dL (ref 3.5–5.0)
Anion gap: 10 (ref 5–15)
BILIRUBIN TOTAL: 0.6 mg/dL (ref 0.3–1.2)
BUN: 18 mg/dL (ref 8–23)
CO2: 24 mmol/L (ref 22–32)
Calcium: 9.4 mg/dL (ref 8.9–10.3)
Chloride: 109 mmol/L (ref 98–111)
Creatinine, Ser: 1.25 mg/dL — ABNORMAL HIGH (ref 0.61–1.24)
GFR calc Af Amer: >60 mL/min (ref >60)
GFR calc non Af Amer: 55 mL/min — ABNORMAL LOW (ref >60)
Glucose, Bld: 118 mg/dL — ABNORMAL HIGH (ref 70–99)
Potassium: 3.7 mmol/L (ref 3.5–5.1)
Sodium: 143 mmol/L (ref 135–145)
Total Protein: 6.7 g/dL (ref 6.5–8.1)

## 2018-12-21 LAB — LACTATE DEHYDROGENASE: LDH: 185 U/L (ref 98–192)

## 2018-12-21 LAB — PHOSPHORUS: Phosphorus: 3.1 mg/dL (ref 2.5–4.6)

## 2018-12-21 LAB — URIC ACID: Uric Acid, Serum: 4.7 mg/dL (ref 3.7–8.6)

## 2018-12-21 NOTE — Telephone Encounter (Signed)
Oral Oncology Pharmacist Encounter  I called and spoke with Mr. and Mrs. Balestrieri this morning to update them on lab check for new start [place]. Patient informed that renal function lab increased a bit, other electrolytes and lab values remain in normal limits.  Patient CBC has not yet responded to Venclexta dosing, however, we anticipate it will respond as Venclexta dose is increased over these next several weeks. Patient reminded to maintain water intake at 1.5- 2L per day. Patient reminded about lab and infusion appointments next week and that patient should wait until after his labs are checked on Monday (12/27/2018) and he is in infusion room to take his first dose of Venclexta 50 mg.  All questions answered. They know to call the office with any additional questions or concerns.  Johny Drilling, PharmD, BCPS, BCOP  12/21/2018 9:50 AM Oral Oncology Clinic [phone]

## 2018-12-27 ENCOUNTER — Inpatient Hospital Stay: Payer: Medicare HMO

## 2018-12-27 ENCOUNTER — Encounter: Payer: Self-pay | Admitting: Pharmacist

## 2018-12-27 ENCOUNTER — Other Ambulatory Visit: Payer: Self-pay | Admitting: Pharmacist

## 2018-12-27 ENCOUNTER — Other Ambulatory Visit: Payer: Self-pay

## 2018-12-27 VITALS — BP 151/69 | HR 61 | Temp 98.9°F | Resp 18 | Ht 71.0 in | Wt 185.8 lb

## 2018-12-27 DIAGNOSIS — E860 Dehydration: Secondary | ICD-10-CM

## 2018-12-27 DIAGNOSIS — R61 Generalized hyperhidrosis: Secondary | ICD-10-CM | POA: Diagnosis not present

## 2018-12-27 DIAGNOSIS — C9110 Chronic lymphocytic leukemia of B-cell type not having achieved remission: Secondary | ICD-10-CM

## 2018-12-27 DIAGNOSIS — J439 Emphysema, unspecified: Secondary | ICD-10-CM | POA: Diagnosis not present

## 2018-12-27 DIAGNOSIS — N400 Enlarged prostate without lower urinary tract symptoms: Secondary | ICD-10-CM | POA: Diagnosis not present

## 2018-12-27 DIAGNOSIS — D479 Neoplasm of uncertain behavior of lymphoid, hematopoietic and related tissue, unspecified: Secondary | ICD-10-CM | POA: Diagnosis not present

## 2018-12-27 DIAGNOSIS — K5730 Diverticulosis of large intestine without perforation or abscess without bleeding: Secondary | ICD-10-CM | POA: Diagnosis not present

## 2018-12-27 DIAGNOSIS — Z79899 Other long term (current) drug therapy: Secondary | ICD-10-CM | POA: Diagnosis not present

## 2018-12-27 LAB — COMPREHENSIVE METABOLIC PANEL
ALBUMIN: 3.9 g/dL (ref 3.5–5.0)
ALT: 14 U/L (ref 0–44)
ANION GAP: 9 (ref 5–15)
AST: 14 U/L — ABNORMAL LOW (ref 15–41)
Alkaline Phosphatase: 67 U/L (ref 38–126)
BUN: 18 mg/dL (ref 8–23)
CO2: 24 mmol/L (ref 22–32)
Calcium: 9.7 mg/dL (ref 8.9–10.3)
Chloride: 110 mmol/L (ref 98–111)
Creatinine, Ser: 1.34 mg/dL — ABNORMAL HIGH (ref 0.61–1.24)
GFR calc Af Amer: 59 mL/min — ABNORMAL LOW (ref >60)
GFR calc non Af Amer: 51 mL/min — ABNORMAL LOW (ref >60)
Glucose, Bld: 106 mg/dL — ABNORMAL HIGH (ref 70–99)
Potassium: 3.6 mmol/L (ref 3.5–5.1)
Sodium: 143 mmol/L (ref 135–145)
Total Bilirubin: 0.4 mg/dL (ref 0.3–1.2)
Total Protein: 7 g/dL (ref 6.5–8.1)

## 2018-12-27 LAB — CBC WITH DIFFERENTIAL/PLATELET
Abs Immature Granulocytes: 0.09 10*3/uL — ABNORMAL HIGH (ref 0.00–0.07)
Basophils Absolute: 0.1 10*3/uL (ref 0.0–0.1)
Basophils Relative: 0 %
Eosinophils Absolute: 0.7 10*3/uL — ABNORMAL HIGH (ref 0.0–0.5)
Eosinophils Relative: 1 %
HCT: 41.3 % (ref 39.0–52.0)
Hemoglobin: 12.9 g/dL — ABNORMAL LOW (ref 13.0–17.0)
Immature Granulocytes: 0 %
Lymphocytes Relative: 95 %
Lymphs Abs: 65.5 10*3/uL — ABNORMAL HIGH (ref 0.7–4.0)
MCH: 29.3 pg (ref 26.0–34.0)
MCHC: 31.2 g/dL (ref 30.0–36.0)
MCV: 93.7 fL (ref 80.0–100.0)
Monocytes Absolute: 0.9 10*3/uL (ref 0.1–1.0)
Monocytes Relative: 1 %
Neutro Abs: 2.1 10*3/uL (ref 1.7–7.7)
Neutrophils Relative %: 3 %
Platelets: 148 10*3/uL — ABNORMAL LOW (ref 150–400)
RBC: 4.41 MIL/uL (ref 4.22–5.81)
RDW: 14.1 % (ref 11.5–15.5)
WBC: 69.3 10*3/uL (ref 4.0–10.5)
nRBC: 0 % (ref 0.0–0.2)

## 2018-12-27 LAB — LACTATE DEHYDROGENASE: LDH: 170 U/L (ref 98–192)

## 2018-12-27 LAB — PHOSPHORUS: Phosphorus: 2.4 mg/dL — ABNORMAL LOW (ref 2.5–4.6)

## 2018-12-27 LAB — URIC ACID: URIC ACID, SERUM: 4.5 mg/dL (ref 3.7–8.6)

## 2018-12-27 MED ORDER — SODIUM CHLORIDE 0.9 % IV SOLN
Freq: Once | INTRAVENOUS | Status: AC
  Administered 2018-12-27: 10:00:00 via INTRAVENOUS
  Filled 2018-12-27: qty 250

## 2018-12-27 NOTE — Progress Notes (Signed)
Oral Chemotherapy Pharmacist Encounter  Received notification from RN administering Mr. Hoaglin's fluids that he had only 1 dose of Venclexta 20mg removed from dosing card and it did not appear patient had taken any further tablets since last Monday, 12/20/18. This was confirmed with a pill count.  Spoke with Dr. Kale for plan. Patient will proceed with 20mg once daily dosing starting today through Saturday 01/01/19.  Patient will not take any tablets on Sunday, 01/02/19.  Patient will start Venclexta 50mg tablets, 1 tablets once daily starting Monday, 01/03/2019 Patient will have lab check that morning, will receive IV fluids, repeat lab check 4/6 PM, then repeat labs again on Tuesday, 4/7. Patient will start 100mg once daily dosing on 4/13, start 200mg daily dosing on 4/20, and start 400mg daily dosing on 4/27. This pushes his entire schedule out 1 week.   I spoke with patient in infusion area for repeat education on Venclexta (venetoclax)for the treatment of chronic lymphocytic leukemia with p53 mutation and 17p deletion, planned durationuntil disease progression or unacceptable toxicity.  Counseledpatient on administration, dosing, side effects, monitoring, drug-food interactions, safe handling, storage, and disposal.  We went through his pill box and talked through administration for each of the weeks included in the starter pack box. Patient informed to take his dose each day. Patient informed that next dosing package will be a bottle of the 100mg tablets, once we are able to increase dose to 400mg once daily.  We reviewed that he should take his Venclexcta by mouth once daily with food and water. We reviewed the importance of lab checks and the risk for tumor lysis syndrome.  CBC, uric acid, and electrolytes will be monitored at baseline, and at 6 and 24 hours post dose administration per protocol. Hydration will be administered oral and IV.  Venclexta start date:  12/27/18  Patient states he continues on allopurinol 100mg every 12 hours.  Patient states he continues with fluid intake to 1.5 - 2L of water per day.  On Monday, 01/03/19: Patient will start Venclexta 50mg tablets, 1 tablet by mouth once daily with food and water for 7 days. The 1st dose of Venclexta 50mg tablets will be administered at the office. Baseline labs will be assessed and the 1st dose will be administered. CBC, uric acid, and electrolytes will be monitored at 6 and 24 hours post dose administration per protocol. Hydration will be administered oral and IV.  Subsequent ramp-up doses of 100mg daily x 7 days, 200mg daily x 7 days, and 400mg daily (target maintenance dose) will be monitored with baseline labs.  Adverse effects include but are not limited to: TLS, decreased blood counts, electrolyte abnormalities, diarrhea, nausea, fatigue, arthralgias/myalgias, and upper respiratory tract infection.    Patient will contact office it if nausea develops.   Patient will obtain anti diarrheal and alert the office of 4 or more loose stools above baseline.  Reviewed with patient importance of keeping a medication schedule and plan for any missed doses.  Mr. Hurlock voiced understanding and appreciation.   All questions answered. Medication reconciliation performed and medication/allergy list updated.  Mr. Bently knows to call the office with questions or concerns. Oral Oncology Clinic will continue to follow.  Jesse , PharmD, BCPS, BCOP  12/27/2018  10:42 AM  Oral Oncology Clinic [phone] 

## 2018-12-27 NOTE — Progress Notes (Signed)
Pt took oral dose of chemo today during visit. Mosetta Pigeon, chairside to discuss and clarify how to take oral doses at home

## 2018-12-27 NOTE — Patient Instructions (Signed)
Venetoclax oral tablets What is this medicine? VENETOCLAX (ven et oh klax) is a medicine that targets proteins in cancer cells and stops the cancer cells from growing. It is used to treat chronic lymphocytic leukemia, small lymphocytic lymphoma, and acute myelogenous leukemia. This medicine may be used for other purposes; ask your health care provider or pharmacist if you have questions. COMMON BRAND NAME(S): Venclexta What should I tell my health care provider before I take this medicine? They need to know if you have any of these conditions: -gout -high levels of uric acid in the blood -kidney disease -liver disease -low or high levels of potassium, phosphorus, or calcium in the blood -scheduled to receive a vaccine -an unusual or allergic reaction to venetoclax, other medicines, foods, dyes, or preservatives -pregnant or trying to get pregnant -breast-feeding How should I use this medicine? Take this medicine by mouth with food and a glass of water. Follow the directions on the prescription label. Do not cut, crush, or chew this medicine. Do not take with grapefruit juice or eat [place] oranges or starfruit. Take your medicine at regular intervals. Do not take it more often than directed. Do not stop taking except on your doctor's advice. A special MedGuide will be given to you by the pharmacist with each prescription and refill. Be sure to read this information carefully each time. Talk to your pediatrician regarding the use of this medicine in children. Special care may be needed. Overdosage: If you think you have taken too much of this medicine contact a poison control center or emergency room at once. NOTE: This medicine is only for you. Do not share this medicine with others. What if I miss a dose? If you miss a dose, take it as soon as you can. If your next dose is to be taken in less than 16 hours, then do not take the missed dose. Take the next dose at your regular time. Do not take  double or extra doses. If you vomit after a dose, do not take another dose; take the next day's dose at the usual time. What may interact with this medicine? This medicine may interact with the following medications: -bosentan -calcium channel blockers like diltiazem and verapamil -captopril -carvedilol -certain antibiotics like azithromycin, erythromycin, and clarithromycin -certain medicines for fungal infections like fluconazole, itraconazole, ketoconazole, posaconazole, and voriconazole -certain medicines for irregular heart beat like amiodarone, dronedarone, and quinidine -certain medicines for seizures like carbamazepine and phenytoin -ciprofloxacin -conivaptan -cyclosporine -digoxin -efavirenz -etravirine -everolimus -felodipine -grapefruit products, [place] oranges, or starfruit -indinavir -live virus vaccines -lopinavir -modafinil -nafcillin -quercetin -ranolazine -rifampin -ritonavir -sirolimus -St. John's wort -telaprevir -ticagrelor -warfarin This list may not describe all possible interactions. Give your health care provider a list of all the medicines, herbs, non-prescription drugs, or dietary supplements you use. Also tell them if you smoke, drink alcohol, or use illegal drugs. Some items may interact with your medicine. What should I watch for while using this medicine? This medicine can cause serious reactions. To reduce your risk you will need to take other medicine(s) before treatment with this medicine. Take your medicine as directed. You may need blood work done while you are taking this medicine. Drink plenty of fluids while you are taking this medicine. Call your doctor or health care professional for advice if you get a fever, chills or sore throat, or other symptoms of a cold or flu. Do not treat yourself. This drug decreases your body's ability to fight infections. Try to avoid  being around people who are sick. Do not become pregnant while taking this  medicine or for 30 days after stopping it. Women should inform their doctor if they wish to become pregnant or think they might be pregnant. There is a potential for serious side effects to an unborn child. Talk to your health care professional or pharmacist for more information. Do not breast-feed an infant while taking this medicine. This may interfere with the ability to father a child. You should talk to your doctor or health care professional if you are concerned about your fertility. What side effects may I notice from receiving this medicine? Side effects that you should report to your doctor or health care professional as soon as possible: -allergic reactions like skin rash, itching or hives, swelling of the face, lips, or tongue -low blood counts - this medicine may decrease the number of white blood cells, red blood cells and platelets. You may be at increased risk for infections and bleeding -signs and symptoms of infection like fever or chills; cough; sore throat; or pain when urinating -signs and symptoms of tumor lysis syndrome like nausea, vomiting, confusion, shortness of breath, seizures, irregular heartbeat, dark urine, tiredness, muscle pain, and/or joint pain Side effects that usually do not require medical attention (report to your doctor or health care professional if they continue or are bothersome): -back pain -constipation -diarrhea -dizziness -headache -stomach pain -swelling of the ankles, feet, hands This list may not describe all possible side effects. Call your doctor for medical advice about side effects. You may report side effects to FDA at 1-800-FDA-1088. Where should I keep my medicine? Keep out of the reach of children. Store below 30 degrees C (86 degrees F). Keep this medicine in the original package during the first 4 weeks of treatment. Throw away any unused medicine after the expiration date. NOTE: This sheet is a summary. It may not cover all possible  information. If you have questions about this medicine, talk to your doctor, pharmacist, or health care provider.  2019 Elsevier/Gold Standard (2018-04-29 15:07:49)

## 2018-12-28 ENCOUNTER — Other Ambulatory Visit: Payer: Self-pay

## 2018-12-28 ENCOUNTER — Telehealth: Payer: Self-pay | Admitting: *Deleted

## 2018-12-28 ENCOUNTER — Telehealth: Payer: Self-pay | Admitting: Hematology

## 2018-12-28 ENCOUNTER — Telehealth: Payer: Self-pay | Admitting: Pharmacist

## 2018-12-28 ENCOUNTER — Inpatient Hospital Stay: Payer: Medicare HMO

## 2018-12-28 DIAGNOSIS — N400 Enlarged prostate without lower urinary tract symptoms: Secondary | ICD-10-CM | POA: Diagnosis not present

## 2018-12-28 DIAGNOSIS — J439 Emphysema, unspecified: Secondary | ICD-10-CM | POA: Diagnosis not present

## 2018-12-28 DIAGNOSIS — C9110 Chronic lymphocytic leukemia of B-cell type not having achieved remission: Secondary | ICD-10-CM

## 2018-12-28 DIAGNOSIS — D479 Neoplasm of uncertain behavior of lymphoid, hematopoietic and related tissue, unspecified: Secondary | ICD-10-CM | POA: Diagnosis not present

## 2018-12-28 DIAGNOSIS — K5730 Diverticulosis of large intestine without perforation or abscess without bleeding: Secondary | ICD-10-CM | POA: Diagnosis not present

## 2018-12-28 DIAGNOSIS — R61 Generalized hyperhidrosis: Secondary | ICD-10-CM | POA: Diagnosis not present

## 2018-12-28 DIAGNOSIS — Z79899 Other long term (current) drug therapy: Secondary | ICD-10-CM | POA: Diagnosis not present

## 2018-12-28 LAB — COMPREHENSIVE METABOLIC PANEL
ALK PHOS: 63 U/L (ref 38–126)
ALT: 17 U/L (ref 0–44)
ANION GAP: 9 (ref 5–15)
AST: 17 U/L (ref 15–41)
Albumin: 3.6 g/dL (ref 3.5–5.0)
BUN: 18 mg/dL (ref 8–23)
CALCIUM: 9 mg/dL (ref 8.9–10.3)
CO2: 24 mmol/L (ref 22–32)
Chloride: 112 mmol/L — ABNORMAL HIGH (ref 98–111)
Creatinine, Ser: 1.19 mg/dL (ref 0.61–1.24)
GFR calc Af Amer: >60 mL/min (ref >60)
GFR calc non Af Amer: 59 mL/min — ABNORMAL LOW (ref >60)
Glucose, Bld: 92 mg/dL (ref 70–99)
Potassium: 3.8 mmol/L (ref 3.5–5.1)
Sodium: 145 mmol/L (ref 135–145)
Total Bilirubin: 0.3 mg/dL (ref 0.3–1.2)
Total Protein: 6.5 g/dL (ref 6.5–8.1)

## 2018-12-28 LAB — PHOSPHORUS: Phosphorus: 2.9 mg/dL (ref 2.5–4.6)

## 2018-12-28 LAB — LACTATE DEHYDROGENASE: LDH: 178 U/L (ref 98–192)

## 2018-12-28 LAB — URIC ACID: Uric Acid, Serum: 4.2 mg/dL (ref 3.7–8.6)

## 2018-12-28 NOTE — Telephone Encounter (Signed)
Patient here today for AM lab work. This RN called to [facility] lobby to answer patient questions regarding how to take Venclexta.  Reviewed and repeated instructions written on 3/30 AVS by Mosetta Pigeon, [facility] w/[facility].   Looked at pill pack for week 1 (20 mg tablets) and reviewed each day with patient.  Take Day 3 -  20 mg tablet on Tuesday, follow with Day 4 on Wednesday, Day 5 on Thursday, Day 6 on Friday and Day 7 on Saturday. Do not take any tablets on Sunday 4/5. Next Monday 4/6, come to [facility] for labs and fluid infusion. Take 50 mg tablet once daily, starting Day 1 of that dose on Monday 4/6.  Patient encouraged to contact office for repeat of directions once home if needed. Patient stated he understood.

## 2018-12-28 NOTE — Telephone Encounter (Signed)
Scheduled lab appt per 3/30 sch message - pt aware of appt date and time

## 2018-12-28 NOTE — Telephone Encounter (Signed)
Oral Oncology Pharmacist Encounter  I called and spoke with [name] this morning to update them on lab check for new start [place].  [name] informed that renal function and other lab values remain in normal limits.   No CBC was drawn today.  We reviewed daily doing of Venclexta and appointment schedule. We reviewed that patient should maintain water intake at 1.5- 2L per day. We reviewed that patient should remain on allopurinol 100mg  BID.  We discussed lab and infusion appointments next week and that patient should wait until after his labs are checked on Monday (01/03/2019) and he is in infusion room to take his first dose of Venclexta 50 mg.  [name] requests to be able to be on the phone during office visit with Dr. Irene Limbo on 01/04/2019.  All questions answered. They know to call the office with any additional questions or concerns.  Javier [name], PharmD, BCPS, BCOP  12/28/2018  9:29 AM  Oral Oncology Clinic [phone]

## 2019-01-03 ENCOUNTER — Other Ambulatory Visit: Payer: Self-pay

## 2019-01-03 ENCOUNTER — Inpatient Hospital Stay: Payer: Medicare HMO | Attending: Hematology

## 2019-01-03 ENCOUNTER — Telehealth: Payer: Self-pay | Admitting: Pharmacist

## 2019-01-03 ENCOUNTER — Other Ambulatory Visit: Payer: Self-pay | Admitting: *Deleted

## 2019-01-03 ENCOUNTER — Encounter: Payer: Self-pay | Admitting: *Deleted

## 2019-01-03 ENCOUNTER — Inpatient Hospital Stay: Payer: Medicare HMO

## 2019-01-03 DIAGNOSIS — E785 Hyperlipidemia, unspecified: Secondary | ICD-10-CM | POA: Diagnosis not present

## 2019-01-03 DIAGNOSIS — C9110 Chronic lymphocytic leukemia of B-cell type not having achieved remission: Secondary | ICD-10-CM

## 2019-01-03 DIAGNOSIS — R5383 Other fatigue: Secondary | ICD-10-CM | POA: Insufficient documentation

## 2019-01-03 DIAGNOSIS — R61 Generalized hyperhidrosis: Secondary | ICD-10-CM | POA: Diagnosis not present

## 2019-01-03 DIAGNOSIS — N400 Enlarged prostate without lower urinary tract symptoms: Secondary | ICD-10-CM | POA: Diagnosis not present

## 2019-01-03 DIAGNOSIS — Z87891 Personal history of nicotine dependence: Secondary | ICD-10-CM | POA: Diagnosis not present

## 2019-01-03 DIAGNOSIS — E559 Vitamin D deficiency, unspecified: Secondary | ICD-10-CM | POA: Insufficient documentation

## 2019-01-03 DIAGNOSIS — Z7982 Long term (current) use of aspirin: Secondary | ICD-10-CM | POA: Diagnosis not present

## 2019-01-03 DIAGNOSIS — K5730 Diverticulosis of large intestine without perforation or abscess without bleeding: Secondary | ICD-10-CM | POA: Insufficient documentation

## 2019-01-03 DIAGNOSIS — Z79899 Other long term (current) drug therapy: Secondary | ICD-10-CM | POA: Insufficient documentation

## 2019-01-03 DIAGNOSIS — I10 Essential (primary) hypertension: Secondary | ICD-10-CM | POA: Insufficient documentation

## 2019-01-03 DIAGNOSIS — R7303 Prediabetes: Secondary | ICD-10-CM | POA: Insufficient documentation

## 2019-01-03 LAB — CBC WITH DIFFERENTIAL (CANCER CENTER ONLY)
Abs Immature Granulocytes: 0.07 10*3/uL (ref 0.00–0.07)
Basophils Absolute: 0.1 10*3/uL (ref 0.0–0.1)
Basophils Relative: 0 %
Eosinophils Absolute: 0.1 10*3/uL (ref 0.0–0.5)
Eosinophils Relative: 0 %
HCT: 39.3 % (ref 39.0–52.0)
Hemoglobin: 12.2 g/dL — ABNORMAL LOW (ref 13.0–17.0)
Immature Granulocytes: 0 %
Lymphocytes Relative: 93 %
Lymphs Abs: 58.8 10*3/uL — ABNORMAL HIGH (ref 0.7–4.0)
MCH: 29.5 pg (ref 26.0–34.0)
MCHC: 31 g/dL (ref 30.0–36.0)
MCV: 95.2 fL (ref 80.0–100.0)
Monocytes Absolute: 0.8 10*3/uL (ref 0.1–1.0)
Monocytes Relative: 1 %
Neutro Abs: 3.8 10*3/uL (ref 1.7–7.7)
Neutrophils Relative %: 6 %
Platelet Count: 152 10*3/uL (ref 150–400)
RBC: 4.13 MIL/uL — ABNORMAL LOW (ref 4.22–5.81)
RDW: 14.2 % (ref 11.5–15.5)
WBC Count: 63.6 10*3/uL (ref 4.0–10.5)
nRBC: 0 % (ref 0.0–0.2)

## 2019-01-03 LAB — CBC WITH DIFFERENTIAL/PLATELET
Abs Immature Granulocytes: 0.08 10*3/uL — ABNORMAL HIGH (ref 0.00–0.07)
Basophils Absolute: 0.1 10*3/uL (ref 0.0–0.1)
Basophils Relative: 0 %
Eosinophils Absolute: 0.1 10*3/uL (ref 0.0–0.5)
Eosinophils Relative: 0 %
HCT: 41.2 % (ref 39.0–52.0)
Hemoglobin: 12.8 g/dL — ABNORMAL LOW (ref 13.0–17.0)
Immature Granulocytes: 0 %
Lymphocytes Relative: 92 %
Lymphs Abs: 46.1 10*3/uL — ABNORMAL HIGH (ref 0.7–4.0)
MCH: 29.4 pg (ref 26.0–34.0)
MCHC: 31.1 g/dL (ref 30.0–36.0)
MCV: 94.7 fL (ref 80.0–100.0)
Monocytes Absolute: 0.9 10*3/uL (ref 0.1–1.0)
Monocytes Relative: 2 %
Neutro Abs: 3.2 10*3/uL (ref 1.7–7.7)
Neutrophils Relative %: 6 %
Platelets: 153 10*3/uL (ref 150–400)
RBC: 4.35 MIL/uL (ref 4.22–5.81)
RDW: 14.1 % (ref 11.5–15.5)
WBC: 50.4 10*3/uL (ref 4.0–10.5)
nRBC: 0 % (ref 0.0–0.2)

## 2019-01-03 LAB — COMPREHENSIVE METABOLIC PANEL
ALT: 15 U/L (ref 0–44)
ALT: 12 U/L (ref 0–44)
AST: 15 U/L (ref 15–41)
AST: 13 U/L — ABNORMAL LOW (ref 15–41)
Albumin: 3.9 g/dL (ref 3.5–5.0)
Albumin: 3.7 g/dL (ref 3.5–5.0)
Alkaline Phosphatase: 64 U/L (ref 38–126)
Alkaline Phosphatase: 65 U/L (ref 38–126)
Anion gap: 8 (ref 5–15)
Anion gap: 8 (ref 5–15)
BUN: 14 mg/dL (ref 8–23)
BUN: 13 mg/dL (ref 8–23)
CO2: 26 mmol/L (ref 22–32)
CO2: 25 mmol/L (ref 22–32)
Calcium: 9.8 mg/dL (ref 8.9–10.3)
Calcium: 9.2 mg/dL (ref 8.9–10.3)
Chloride: 108 mmol/L (ref 98–111)
Chloride: 109 mmol/L (ref 98–111)
Creatinine, Ser: 1.18 mg/dL (ref 0.61–1.24)
Creatinine, Ser: 1.21 mg/dL (ref 0.61–1.24)
GFR calc Af Amer: >60 mL/min (ref >60)
GFR calc Af Amer: >60 mL/min (ref >60)
GFR calc non Af Amer: 59 mL/min — ABNORMAL LOW (ref >60)
GFR calc non Af Amer: 57 mL/min — ABNORMAL LOW (ref >60)
Glucose, Bld: 86 mg/dL (ref 70–99)
Glucose, Bld: 126 mg/dL — ABNORMAL HIGH (ref 70–99)
Potassium: 3.6 mmol/L (ref 3.5–5.1)
Potassium: 4.4 mmol/L (ref 3.5–5.1)
Sodium: 142 mmol/L (ref 135–145)
Sodium: 142 mmol/L (ref 135–145)
Total Bilirubin: 0.4 mg/dL (ref 0.3–1.2)
Total Bilirubin: 0.3 mg/dL (ref 0.3–1.2)
Total Protein: 6.9 g/dL (ref 6.5–8.1)
Total Protein: 6.6 g/dL (ref 6.5–8.1)

## 2019-01-03 LAB — PHOSPHORUS
Phosphorus: 2.4 mg/dL — ABNORMAL LOW (ref 2.5–4.6)
Phosphorus: 2.8 mg/dL (ref 2.5–4.6)

## 2019-01-03 LAB — URIC ACID
Uric Acid, Serum: 3.8 mg/dL (ref 3.7–8.6)
Uric Acid, Serum: 3.5 mg/dL — ABNORMAL LOW (ref 3.7–8.6)

## 2019-01-03 LAB — LACTATE DEHYDROGENASE
LDH: 202 U/L — ABNORMAL HIGH (ref 98–192)
LDH: 177 U/L (ref 98–192)

## 2019-01-03 MED ORDER — SODIUM CHLORIDE 0.9 % IV SOLN
Freq: Once | INTRAVENOUS | Status: AC
  Administered 2019-01-03: 10:00:00 via INTRAVENOUS
  Filled 2019-01-03: qty 250

## 2019-01-03 NOTE — Progress Notes (Signed)
Received message from lab, WBC 63.6.  Secure chat sent to Dr. Irene Limbo and Inocencio Homes, RN with Dr. Irene Limbo.   Message acknowledged.

## 2019-01-03 NOTE — Patient Instructions (Signed)
Rehydration, Adult Rehydration is the replacement of body fluids and salts and minerals (electrolytes) that are lost during dehydration. Dehydration is when there is not enough fluid or water in the body. This happens when you lose more fluids than you take in. Common causes of dehydration include:  Vomiting.  Diarrhea.  Excessive sweating, such as from heat exposure or exercise.  Taking medicines that cause the body to lose excess fluid (diuretics).  Impaired kidney function.  Not drinking enough fluid.  Certain illnesses or infections.  Certain poorly controlled long-term (chronic) illnesses, such as diabetes, heart disease, and kidney disease.  Symptoms of mild dehydration may include thirst, dry lips and mouth, dry skin, and dizziness. Symptoms of severe dehydration may include increased heart rate, confusion, fainting, and not urinating. You can rehydrate by drinking certain fluids or getting fluids through an IV tube, as told by your health care provider. What are the risks? Generally, rehydration is safe. However, one problem that can happen is taking in too much fluid (overhydration). This is rare. If overhydration happens, it can cause an electrolyte imbalance, kidney failure, or a decrease in salt (sodium) levels in the body. How to rehydrate Follow instructions from your health care provider for rehydration. The kind of fluid you should drink and the amount you should drink depend on your condition.  If directed by your health care provider, drink an oral rehydration solution (ORS). This is a drink designed to treat dehydration that is found in pharmacies and retail stores. ? Make an ORS by following instructions on the package. ? Start by drinking small amounts, about  cup (120 mL) every 5-10 minutes. ? Slowly increase how much you drink until you have taken the amount recommended by your health care provider.  Drink enough clear fluids to keep your urine clear or pale  yellow. If you were instructed to drink an ORS, finish the ORS first, then start slowly drinking other clear fluids. Drink fluids such as: ? Water. Do not drink only water. Doing that can lead to having too little sodium in your body (hyponatremia). ? Ice chips. ? Fruit juice that you have added water to (diluted juice). ? Low-calorie sports drinks.  If you are severely dehydrated, your health care provider may recommend that you receive fluids through an IV tube in the hospital.  Do not take sodium tablets. Doing that can lead to the condition of having too much sodium in your body (hypernatremia). Eating while you rehydrate Follow instructions from your health care provider about what to eat while you rehydrate. Your health care provider may recommend that you slowly begin eating regular foods in small amounts.  Eat foods that contain a healthy balance of electrolytes, such as bananas, oranges, potatoes, tomatoes, and spinach.  Avoid foods that are greasy or contain a lot of fat or sugar.  In some cases, you may get nutrition through a feeding tube that is passed through your nose and into your stomach (nasogastric tube, or NG tube). This may be done if you have uncontrolled vomiting or diarrhea. Beverages to avoid Certain beverages may make dehydration worse. While you rehydrate, avoid:  Alcohol.  Caffeine.  Drinks that contain a lot of sugar. These include: ? High-calorie sports drinks. ? Fruit juice that is not diluted. ? Soda.  Check nutrition labels to see how much sugar or caffeine a beverage contains. Signs of dehydration recovery You may be recovering from dehydration if:  You are urinating more often than before you started   rehydrating.  Your urine is clear or pale yellow.  Your energy level improves.  You vomit less frequently.  You have diarrhea less frequently.  Your appetite improves or returns to normal.  You feel less dizzy or less light-headed.  Your  skin tone and color start to look more normal. Contact a health care provider if:  You continue to have symptoms of mild dehydration, such as: ? Thirst. ? Dry lips. ? Slightly dry mouth. ? Dry, warm skin. ? Dizziness.  You continue to vomit or have diarrhea. Get help right away if:  You have symptoms of dehydration that get worse.  You feel: ? Confused. ? Weak. ? Like you are going to faint.  You have not urinated in 6-8 hours.  You have very dark urine.  You have trouble breathing.  Your heart rate while sitting still is over 100 beats a minute.  You cannot drink fluids without vomiting.  You have vomiting or diarrhea that: ? Gets worse. ? Does not go away.  You have a fever. This information is not intended to replace advice given to you by your health care provider. Make sure you discuss any questions you have with your health care provider. Document Released: 12/08/2011 Document Revised: 04/04/2016 Document Reviewed: 11/09/2015 Elsevier Interactive Patient Education  2019 Elsevier Inc.  Coronavirus (COVID-19) Are you at risk?  Are you at risk for the Coronavirus (COVID-19)?  To be considered HIGH RISK for Coronavirus (COVID-19), you have to meet the following criteria:  . Traveled to [place], [place], [place], [place] or [place]; or in the [place] to [place], [place], [place], or [place]; and have fever, cough, and shortness of breath within the last 2 weeks of travel OR . Been in close contact with a person diagnosed with COVID-19 within the last 2 weeks and have fever, cough, and shortness of breath . IF YOU DO NOT MEET THESE CRITERIA, YOU ARE CONSIDERED LOW RISK FOR COVID-19.  What to do if you are HIGH RISK for COVID-19?  . If you are having a medical emergency, call 911. . Seek medical care right away. Before you go to a doctor's office, urgent care or emergency department, call ahead and tell them about your recent travel, contact with  someone diagnosed with COVID-19, and your symptoms. You should receive instructions from your physician's office regarding next steps of care.  . When you arrive at healthcare provider, tell the healthcare staff immediately you have returned from visiting [place], [place], [place], [place] or [place]; or traveled in the [place] to [place], [place], [place], or [place]; in the last two weeks or you have been in close contact with a person diagnosed with COVID-19 in the last 2 weeks.   . Tell the health care staff about your symptoms: fever, cough and shortness of breath. . After you have been seen by a medical provider, you will be either: o Tested for (COVID-19) and discharged home on quarantine except to seek medical care if symptoms worsen, and asked to  - Stay home and avoid contact with others until you get your results (4-5 days)  - Avoid travel on public transportation if possible (such as bus, train, or airplane) or o Sent to the Emergency Department by EMS for evaluation, COVID-19 testing, and possible admission depending on your condition and test results.  What to do if you are LOW RISK for COVID-19?  Reduce your risk of any infection by using the same   precautions used for avoiding the common cold or flu:  . Wash your hands often with soap and warm water for at least 20 seconds.  If soap and water are not readily available, use an alcohol-based hand sanitizer with at least 60% alcohol.  . If coughing or sneezing, cover your mouth and nose by coughing or sneezing into the elbow areas of your shirt or coat, into a tissue or into your sleeve (not your hands). . Avoid shaking hands with others and consider head nods or verbal greetings only. . Avoid touching your eyes, nose, or mouth with unwashed hands.  . Avoid close contact with people who are sick. . Avoid places or events with large numbers of people in one location, like concerts or sporting events. . Carefully consider  travel plans you have or are making. . If you are planning any travel outside or inside the [place], visit the CDC's Travelers' Health webpage for the latest health notices. . If you have some symptoms but not all symptoms, continue to monitor at home and seek medical attention if your symptoms worsen. . If you are having a medical emergency, call 911.   ADDITIONAL HEALTHCARE OPTIONS FOR PATIENTS  [facility] / e-Visit: [email or web address]         [facility]: [phone]  [facility]: [phone]                   [facility]: [phone]   

## 2019-01-03 NOTE — Telephone Encounter (Signed)
Oral Oncology Pharmacist Encounter  I called and spoke with patient's wife this morning for follow-up on Venclexta. Patient should be initiating Venclexta 50 mg once daily starting today.  Mrs. Meadow states that patient took his Venclexta at 20 mg once daily for the remainder of last week and then took no Venclexta yesterday.  She states that he has been frequently urinating due to increasing his water intake.  This has been keeping him up at night so he is pretty tired in the morning, although it gets better throughout the day. She states he is coughing up an unusual amount of phlegm over the past week and wonders if this is due to the [place].  She also states that patient's primary care provider has previously stated that patient has a large amount of phlegm in his throat. We discussed he may be coughing up this phlegm at this time due to an increase in fluid intake which is thinning out the mucus and helping him to expel it. She denies coughing, fever, or other signs or symptoms of infection. She will let us know if this changes in the future.  All questions answered. They know to call the office with any additional questions or concerns.  Johny Drilling, PharmD, BCPS, BCOP  01/03/2019 9:53 AM Oral Oncology Clinic [phone]

## 2019-01-03 NOTE — Progress Notes (Signed)
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 male who has been referred to [place] by Dr .Unk Pinto, MD for evaluation and management of lymphocytosis concerning for a lymphoproliferative process.  Patient has a h/o HTN, HLD, Prediabetes who notes that he had significant abdominal discomfort with significant dyspeptic symptoms around aug 2017 and had about 20-25lbs weight loss and had an extensive GI workup including CT abd/pelvis 05/25/2016 - WNL, EGD which showed chronic gastritis (helicobacter pylori +ve) and colonscopy with some polyps. Capsule endoscopy was not approved by insurance. Patients symptoms improved after treatment of H.pylori and he notes that he has gained back about 6-7 lbs and is eating better.  Patients labs were noted to show some leucocytosis/Lymphocytosis for which he was referred to [place] for further evaluation.  Patient notes some nightsweats and weight loss (as noted above, possible alternative explanation). No fevers/chills. Has not noted any overt enlarged LN and CT abd in 04/2016 showed no hepatomegaly or splenomegaly and no abd/RP LNadenopathy.  Patient notes no other acute focal symptoms.   INTERVAL HISTORY  Javier [name] is here for management and evaluation of his CLL . The patient was lost to follow up between 11/06/16 and 11/19/18. The patient's last visit with [place] was on 12/06/18. The pt reports that he is doing well overall.   The pt  reports that he has had some "slight headaches," and some "burning in the chest," at night when he lies down. The pt notes that he has otherwise tolerated Venetoclax well, is breathing well and denies skin rashes or abdominal pains. The pt notes that he has been staying well hydrated, and has been waking up more frequently in the night to urinate. He denies any discomfort passing urine. The pt notes that his night sweats are "a little better." He endorses stable energy levels, stable weight, and is continuing to do yard work and remains active.  Lab results today (01/04/19) of CBC and CMP is as follows: all values are WNL except for WBC at 53.9k, HGB at 12.6, Creatinine at 1.30, GFR at 53. 01/04/19 Differential, LDH, and Phosphorous are pending 01/04/19 Uric acid at 3.6  On review of systems, pt reports slight headaches, acid reflux, eating well, staying hydrated, stable energy levels, breathing well, and denies discomfort urinating, skin rashes, mouth sores, abdominal pains, and any other symptoms.   MEDICAL HISTORY:  Past Medical History:  Diagnosis Date  . Diverticulosis   . Hyperlipidemia   . Hypertension   . Prediabetes   . Vitamin D deficiency   Helicobacter pylori +ve   SURGICAL HISTORY: Past Surgical History:  Procedure Laterality Date  . NO PAST SURGERIES      SOCIAL HISTORY: Social History   Socioeconomic History  . Marital status: Married    Spouse name: Not on file  . Number of children: 5  . Years of education: Not on file  . Highest education level: Not on file  Occupational History  . 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 Food insecurity:    Worry: Not on file    Inability: Not on file  . Transportation needs:    Medical: Not on file    Non-medical: Not on file  Tobacco Use  . Smoking status: Former Smoker    Types: Cigarettes    Last attempt to quit: 01/17/2003    Years since quitting: 15.9  . Smokeless tobacco: Never  Used  Substance and Sexual Activity  . Alcohol use: Yes    Comment: very rare  . Drug use: No  . Sexual activity: Not on file  Lifestyle  . Physical activity:    Days per week: Not on file    Minutes per session: Not on file  . Stress: Not on file  Relationships  . Social connections:    Talks on phone: Not on file    Gets together: Not on file    Attends religious service: Not on file    Active member of club or organization: Not on file    Attends meetings of clubs or organizations: Not on file    Relationship status: Not on file  . Intimate partner violence:    Fear of current or ex partner: Not on file    Emotionally abused: Not on file    Physically abused: Not on file    Forced sexual activity: Not on file  Other Topics Concern  . Not on file  Social History Narrative  . Not on file  Quit smoking >10 yrs ago, previously 1.5Packs per week. Started smoking in his teens. Retired. Worked as a Chief Financial Officer.   FAMILY HISTORY: Family History  Problem Relation Age of Onset  . Stroke Mother   . Early death Father        Farm/tractor accident  . Diabetes Brother   . Cirrhosis Brother   . Colon cancer Paternal Grandfather     ALLERGIES:  is allergic to penicillins and ppd [tuberculin purified protein derivative].  MEDICATIONS:  Current Outpatient Medications  Medication Sig Dispense Refill  . allopurinol (ZYLOPRIM) 100 MG tablet Take 1 tablet (100 mg total) by mouth 2 (two) times daily. 60 tablet 2  . ALPRAZolam (XANAX) 0.5 MG tablet TAKE ONE-HALF TABLET BY MOUTH 3 TIMES DAILY BEFORE MEALS AND ONE TABLET DAILY AT BEDTIME (Patient not taking: Take 0.25 mg TABLET BY MOUTH 3 TIMES DAILY BEFORE MEALS AND 0.5 mg TABLET DAILY AT BEDTIME) 90 tablet 0  . aspirin EC 81 MG tablet Take 81 mg by mouth daily.    . benazepril-hydrochlorthiazide (LOTENSIN HCT) 20-25 MG tablet TAKE 1 TABLET EVERY DAY FOR BLOOD PRESSURE (Patient taking differently: Take 1 tablet by mouth daily. ) 90  tablet 1  . Cholecalciferol (VITAMIN D3) 5000 units TABS Take 5,000 Units by mouth daily.    . pantoprazole (PROTONIX) 40 MG tablet Take 1 tablet (40 mg total) by mouth daily. (Patient not taking: Reported on 11/23/2018) 14 tablet 0  . pravastatin (PRAVACHOL) 40 MG tablet TAKE 1 TABLET AT BEDTIME  FOR  CHOLESTEROL (Patient taking differently: Take 40 mg by mouth at bedtime. ) 90 tablet 3  . venetoclax (VENCLEXTA STARTING PACK) 10 & 50 & 100 MG TBPK Week1: Take 68m once daily. WNGEX5 Take 560monce daily. WeMWUX3Take 10049mnce daily. WeeKGMW1ake 200m51mce daily. Take w food and water. 42 each 0   No current facility-administered medications for this visit.     REVIEW OF SYSTEMS:    A 10+  POINT REVIEW OF SYSTEMS WAS OBTAINED including neurology, dermatology, psychiatry, cardiac, respiratory, lymph, extremities, GI, GU, Musculoskeletal, constitutional, breasts, reproductive, HEENT.  All pertinent positives are noted in the HPI.  All others are negative.   PHYSICAL EXAMINATION: ECOG PERFORMANCE STATUS: 1 - Symptomatic but completely ambulatory  Vitals:   01/04/19 0958  BP: (!) 162/86  Pulse: 68  Resp: 18  Temp: (!) 97.5 F (36.4 C)  SpO2: 99%   Filed Weights   01/04/19 0958  Weight: 186 lb 8 oz (84.6 kg)   .Body mass index is 26.01 kg/m.  GENERAL:alert, in no acute distress and comfortable SKIN: no acute rashes, no significant lesions EYES: conjunctiva are pink and non-injected, sclera anicteric OROPHARYNX: MMM, no exudates, no oropharyngeal erythema or ulceration NECK: supple, no JVD LYMPH:  no palpable lymphadenopathy in the cervical, axillary or inguinal regions LUNGS: clear to auscultation b/l with normal respiratory effort HEART: regular rate & rhythm ABDOMEN:  normoactive bowel sounds , non tender, not distended. No palpable hepatosplenomegaly.  Extremity: no pedal edema PSYCH: alert & oriented x 3 with fluent speech NEURO: no focal motor/sensory deficits   LABORATORY  DATA:  I have reviewed the data as listed  . CBC Latest Ref Rng & Units 01/04/2019 01/03/2019 01/03/2019  WBC 4.0 - 10.5 K/uL 53.9(HH) 63.6(HH) 50.4(HH)  Hemoglobin 13.0 - 17.0 g/dL 12.6(L) 12.2(L) 12.8(L)  Hematocrit 39.0 - 52.0 % 40.5 39.3 41.2  Platelets 150 - 400 K/uL 150 152 153   . CBC    Component Value Date/Time   WBC 53.9 (HH) 01/04/2019 0908   RBC 4.26 01/04/2019 0908   HGB 12.6 (L) 01/04/2019 0908   HGB 12.2 (L) 01/03/2019 1509   HGB 14.5 10/21/2016 1324   HCT 40.5 01/04/2019 0908   HCT 43.8 10/21/2016 1324   PLT 150 01/04/2019 0908   PLT 152 01/03/2019 1509   PLT 157 10/21/2016 1324   MCV 95.1 01/04/2019 0908   MCV 90.9 10/21/2016 1324   MCH 29.6 01/04/2019 0908   MCHC 31.1 01/04/2019 0908   RDW 14.3 01/04/2019 0908   RDW 13.3 10/21/2016 1324   LYMPHSABS PENDING 01/04/2019 0908   LYMPHSABS 17.6 (H) 10/21/2016 1324   MONOABS PENDING 01/04/2019 0908   MONOABS 0.6 10/21/2016 1324   EOSABS PENDING 01/04/2019 0908   EOSABS 0.0 10/21/2016 1324   BASOSABS PENDING 01/04/2019 0908   BASOSABS 0.1 10/21/2016 1324     . CMP Latest Ref Rng & Units 01/04/2019 01/03/2019 01/03/2019  Glucose 70 - 99 mg/dL 82 126(H) 86  BUN 8 - 23 mg/dL _0 Creatinine 0.61 - 1.24 mg/dL 1.30(H) 1.21 1.18  Sodium 135 - 145 mmol/L 145 142 142  Potassium 3.5 - 5.1 mmol/L 3.9 4.4 3.6  Chloride 98 - 111 mmol/L 110 109 108  CO2 22 - 32 mmol/L _1 Calcium 8.9 - 10.3 mg/dL 9.7 9.2 9.8  Total Protein 6.5 - 8.1 g/dL 7.0 6.6 6.9  Total Bilirubin 0.3 - 1.2 mg/dL 0.4 0.3 0.4  Alkaline Phos 38 - 126 U/L 67 65 64  AST 15 - 41 U/L 16 13(L) 15  ALT 0 - 44 U/L _2 . Lab Results  Component Value Date   LDH 177 01/03/2019         RADIOGRAPHIC STUDIES: I have personally reviewed the radiological images as listed and agreed with the findings in the report. No results found.  ASSESSMENT & PLAN:   78 y.o. male with  1) Monoclonal CD5 neg B-cell lymphoproliferative disorder -  likely CD5 neg CLL. FISH panel showed p53 (17p13) deletion that would be consistent with this diagnosis . It often represents also be more aggressive form of CLL Differential diagnosis includes - CD5 neg CLL vs splenic lymphoma vs Marginal Zone lymphoma vs LPL (lymphoplasmacytic lymphoma) LDH WNL suggests against a high grade process. Currently no anemia or thrombocytopenia noted. Now with drenching night sweats and fatigue, no fevers/chills.  10/31/16 PET/CT scan shows some small hypermetabolic nodes in the thorax including bilateral hilar paratracheal) esophageal. These would be difficult to biopsy.  Lost to follow up between 11/06/16 and 11/19/18  12/01/18 PET/CT revealed Stable scattered small hypermetabolic thoracic lymph nodes, once again at [place] 5 activity level. 2. No findings of involvement in the neck, abdomen/pelvis, or skeleton. 3. Prostatomegaly. 4. Aortic Atherosclerosis and Emphysema 5. Sigmoid colon diverticulosis  PLAN -Discussed pt labwork today, 01/04/19; WBC improved to 53.9k, HGB at 12.6, uric acid not elevated at 3.6, other chemistries are stable -Advised raising the head of his bed and not eating food within 2 hours of ideal bed time -Pt began 19m Venetoclax last week and tolerated this well -The pt has no prohibitive toxicities from increasing to 569mVenetoclax at this time. Will continue gradual dose increase. -Will watch for indications of beginning Gazyva, but aiming to start this later than sooner given Covid19 prevalence and hoping to avoid immunosuppression if this can be avoided  -Will look to decrease IVF frequency as pt is staying well hydrated at home -Will monitor labs weekly and see pt weekly during first month of treatment. -Did order acid suppressant that does not interact with Venetoclax -Discussed that I recommend following up with Dr. McMelford Aaseurther regarding dyspepsia and feeling of impending doom, and rule out cardiologic etiology -Recommend  following up with PCP for prostatomegaly management and recommendations. -Will see the pt back in 1 week   2) . Patient Active Problem List   Diagnosis Date Noted  . BMI 24.0-24.9, adult 03/01/2018  . Anxiety 02/08/2018  . [place]  . Encounter for Medicare annual wellness exam 09/11/2015  . GERD  02/09/2015  . Medication management 11/02/2013  . Hyperlipidemia   . Hypertension   . Prediabetes   . Vitamin D deficiency   . Diverticulosis    -f/u with PCP for management of other medical co-morbids  3) H.pylori gastritis -f/u with GI to ensure eradication of h.pylori post treatment.   RTC with Dr KaIrene Limboith labs and IVF(2hours) weekly x 3 (each Monday)   All of the patients questions were answered with apparent satisfaction. The patient knows to call the clinic with any problems, questions or concerns.  The total time spent in the appt was 25 minutes and more than 50% was on counseling and direct patient cares.    GaSullivan LoneD MS AAHIVMS SCValley Physicians Surgery [facility] At Northridge LLCTCapital Medical Centerematology/Oncology Physician CoSt. Tammany Parish Hospital(Office):       [phone] cell):  [phone]):           [phone]/03/2019 10:02 AM  I, ScBaldwin Jamaicaam acting as a scribe for Dr. GaSullivan Lone  .I have reviewed the above documentation for accuracy and completeness, and I agree with the above. .GBrunetta GeneraD

## 2019-01-04 ENCOUNTER — Inpatient Hospital Stay: Payer: Medicare HMO

## 2019-01-04 ENCOUNTER — Inpatient Hospital Stay (HOSPITAL_BASED_OUTPATIENT_CLINIC_OR_DEPARTMENT_OTHER): Payer: Medicare HMO | Admitting: Hematology

## 2019-01-04 ENCOUNTER — Other Ambulatory Visit: Payer: Self-pay

## 2019-01-04 ENCOUNTER — Telehealth: Payer: Self-pay | Admitting: Hematology

## 2019-01-04 ENCOUNTER — Telehealth: Payer: Self-pay | Admitting: *Deleted

## 2019-01-04 VITALS — BP 162/86 | HR 68 | Temp 97.5°F | Resp 18 | Ht 71.0 in | Wt 186.5 lb

## 2019-01-04 DIAGNOSIS — E559 Vitamin D deficiency, unspecified: Secondary | ICD-10-CM | POA: Diagnosis not present

## 2019-01-04 DIAGNOSIS — N400 Enlarged prostate without lower urinary tract symptoms: Secondary | ICD-10-CM | POA: Diagnosis not present

## 2019-01-04 DIAGNOSIS — C9110 Chronic lymphocytic leukemia of B-cell type not having achieved remission: Secondary | ICD-10-CM | POA: Diagnosis not present

## 2019-01-04 DIAGNOSIS — R5383 Other fatigue: Secondary | ICD-10-CM

## 2019-01-04 DIAGNOSIS — Z79899 Other long term (current) drug therapy: Secondary | ICD-10-CM

## 2019-01-04 DIAGNOSIS — R61 Generalized hyperhidrosis: Secondary | ICD-10-CM | POA: Diagnosis not present

## 2019-01-04 DIAGNOSIS — K5730 Diverticulosis of large intestine without perforation or abscess without bleeding: Secondary | ICD-10-CM | POA: Diagnosis not present

## 2019-01-04 DIAGNOSIS — R7303 Prediabetes: Secondary | ICD-10-CM | POA: Diagnosis not present

## 2019-01-04 DIAGNOSIS — Z9189 Other specified personal risk factors, not elsewhere classified: Secondary | ICD-10-CM

## 2019-01-04 DIAGNOSIS — E785 Hyperlipidemia, unspecified: Secondary | ICD-10-CM | POA: Diagnosis not present

## 2019-01-04 DIAGNOSIS — I10 Essential (primary) hypertension: Secondary | ICD-10-CM | POA: Diagnosis not present

## 2019-01-04 LAB — COMPREHENSIVE METABOLIC PANEL
ALT: 14 U/L (ref 0–44)
AST: 16 U/L (ref 15–41)
Albumin: 3.9 g/dL (ref 3.5–5.0)
Alkaline Phosphatase: 67 U/L (ref 38–126)
Anion gap: 9 (ref 5–15)
BUN: 15 mg/dL (ref 8–23)
CO2: 26 mmol/L (ref 22–32)
Calcium: 9.7 mg/dL (ref 8.9–10.3)
Chloride: 110 mmol/L (ref 98–111)
Creatinine, Ser: 1.3 mg/dL — ABNORMAL HIGH (ref 0.61–1.24)
GFR calc Af Amer: >60 mL/min (ref >60)
GFR calc non Af Amer: 53 mL/min — ABNORMAL LOW (ref >60)
Glucose, Bld: 82 mg/dL (ref 70–99)
Potassium: 3.9 mmol/L (ref 3.5–5.1)
Sodium: 145 mmol/L (ref 135–145)
Total Bilirubin: 0.4 mg/dL (ref 0.3–1.2)
Total Protein: 7 g/dL (ref 6.5–8.1)

## 2019-01-04 LAB — LACTATE DEHYDROGENASE: LDH: 192 U/L (ref 98–192)

## 2019-01-04 LAB — CBC WITH DIFFERENTIAL/PLATELET
Abs Immature Granulocytes: 0.07 10*3/uL (ref 0.00–0.07)
Basophils Absolute: 0.1 10*3/uL (ref 0.0–0.1)
Basophils Relative: 0 %
Eosinophils Absolute: 0.1 10*3/uL (ref 0.0–0.5)
Eosinophils Relative: 0 %
HCT: 40.5 % (ref 39.0–52.0)
Hemoglobin: 12.6 g/dL — ABNORMAL LOW (ref 13.0–17.0)
Immature Granulocytes: 0 %
Lymphocytes Relative: 93 %
Lymphs Abs: 50.1 10*3/uL — ABNORMAL HIGH (ref 0.7–4.0)
MCH: 29.6 pg (ref 26.0–34.0)
MCHC: 31.1 g/dL (ref 30.0–36.0)
MCV: 95.1 fL (ref 80.0–100.0)
Monocytes Absolute: 0.9 10*3/uL (ref 0.1–1.0)
Monocytes Relative: 2 %
Neutro Abs: 2.8 10*3/uL (ref 1.7–7.7)
Neutrophils Relative %: 5 %
Platelets: 150 10*3/uL (ref 150–400)
RBC: 4.26 MIL/uL (ref 4.22–5.81)
RDW: 14.3 % (ref 11.5–15.5)
WBC: 53.9 10*3/uL (ref 4.0–10.5)
nRBC: 0 % (ref 0.0–0.2)

## 2019-01-04 LAB — PHOSPHORUS: Phosphorus: 2.7 mg/dL (ref 2.5–4.6)

## 2019-01-04 LAB — URIC ACID: Uric Acid, Serum: 3.6 mg/dL — ABNORMAL LOW (ref 3.7–8.6)

## 2019-01-04 NOTE — Telephone Encounter (Signed)
Contacted wife to clarify/confirm schedule for next 3 Monday's for patient. Patient has lab/MD/Fluids scheduled for each Monday. Per Dr. Irene Limbo, may or may not need fluids, it will depend on lab results. Wife verbalized understanding.

## 2019-01-04 NOTE — Telephone Encounter (Signed)
Scheduled appt per 4/7 los.  Left a VM of upcoming appt.

## 2019-01-05 ENCOUNTER — Other Ambulatory Visit: Payer: Self-pay | Admitting: Physician Assistant

## 2019-01-10 ENCOUNTER — Inpatient Hospital Stay: Payer: Medicare HMO

## 2019-01-10 ENCOUNTER — Other Ambulatory Visit: Payer: Self-pay

## 2019-01-10 ENCOUNTER — Inpatient Hospital Stay (HOSPITAL_BASED_OUTPATIENT_CLINIC_OR_DEPARTMENT_OTHER): Payer: Medicare HMO | Admitting: Hematology

## 2019-01-10 ENCOUNTER — Telehealth: Payer: Self-pay | Admitting: Hematology

## 2019-01-10 VITALS — BP 147/67 | HR 74 | Temp 97.6°F | Resp 19 | Ht 71.0 in | Wt 181.5 lb

## 2019-01-10 DIAGNOSIS — C9110 Chronic lymphocytic leukemia of B-cell type not having achieved remission: Secondary | ICD-10-CM

## 2019-01-10 DIAGNOSIS — Z9189 Other specified personal risk factors, not elsewhere classified: Secondary | ICD-10-CM

## 2019-01-10 DIAGNOSIS — Z79899 Other long term (current) drug therapy: Secondary | ICD-10-CM | POA: Diagnosis not present

## 2019-01-10 DIAGNOSIS — K5730 Diverticulosis of large intestine without perforation or abscess without bleeding: Secondary | ICD-10-CM | POA: Diagnosis not present

## 2019-01-10 DIAGNOSIS — R7303 Prediabetes: Secondary | ICD-10-CM | POA: Diagnosis not present

## 2019-01-10 DIAGNOSIS — E559 Vitamin D deficiency, unspecified: Secondary | ICD-10-CM | POA: Diagnosis not present

## 2019-01-10 DIAGNOSIS — N400 Enlarged prostate without lower urinary tract symptoms: Secondary | ICD-10-CM | POA: Diagnosis not present

## 2019-01-10 DIAGNOSIS — R61 Generalized hyperhidrosis: Secondary | ICD-10-CM | POA: Diagnosis not present

## 2019-01-10 DIAGNOSIS — E785 Hyperlipidemia, unspecified: Secondary | ICD-10-CM | POA: Diagnosis not present

## 2019-01-10 DIAGNOSIS — I10 Essential (primary) hypertension: Secondary | ICD-10-CM | POA: Diagnosis not present

## 2019-01-10 DIAGNOSIS — R5383 Other fatigue: Secondary | ICD-10-CM | POA: Diagnosis not present

## 2019-01-10 LAB — CBC WITH DIFFERENTIAL/PLATELET
Abs Immature Granulocytes: 0.08 10*3/uL — ABNORMAL HIGH (ref 0.00–0.07)
Basophils Absolute: 0 10*3/uL (ref 0.0–0.1)
Basophils Relative: 0 %
Eosinophils Absolute: 0.1 10*3/uL (ref 0.0–0.5)
Eosinophils Relative: 0 %
HCT: 42.6 % (ref 39.0–52.0)
Hemoglobin: 13.6 g/dL (ref 13.0–17.0)
Immature Granulocytes: 0 %
Lymphocytes Relative: 93 %
Lymphs Abs: 50.8 10*3/uL — ABNORMAL HIGH (ref 0.7–4.0)
MCH: 29.4 pg (ref 26.0–34.0)
MCHC: 31.9 g/dL (ref 30.0–36.0)
MCV: 92.2 fL (ref 80.0–100.0)
Monocytes Absolute: 0.7 10*3/uL (ref 0.1–1.0)
Monocytes Relative: 1 %
Neutro Abs: 3 10*3/uL (ref 1.7–7.7)
Neutrophils Relative %: 6 %
Platelets: 158 10*3/uL (ref 150–400)
RBC: 4.62 MIL/uL (ref 4.22–5.81)
RDW: 13.9 % (ref 11.5–15.5)
WBC: 54.7 10*3/uL (ref 4.0–10.5)
nRBC: 0 % (ref 0.0–0.2)

## 2019-01-10 LAB — COMPREHENSIVE METABOLIC PANEL
ALT: 11 U/L (ref 0–44)
AST: 16 U/L (ref 15–41)
Albumin: 3.9 g/dL (ref 3.5–5.0)
Alkaline Phosphatase: 65 U/L (ref 38–126)
Anion gap: 11 (ref 5–15)
BUN: 21 mg/dL (ref 8–23)
CO2: 22 mmol/L (ref 22–32)
Calcium: 9.9 mg/dL (ref 8.9–10.3)
Chloride: 107 mmol/L (ref 98–111)
Creatinine, Ser: 1.29 mg/dL — ABNORMAL HIGH (ref 0.61–1.24)
GFR calc Af Amer: >60 mL/min (ref >60)
GFR calc non Af Amer: 53 mL/min — ABNORMAL LOW (ref >60)
Glucose, Bld: 117 mg/dL — ABNORMAL HIGH (ref 70–99)
Potassium: 3.8 mmol/L (ref 3.5–5.1)
Sodium: 140 mmol/L (ref 135–145)
Total Bilirubin: 0.4 mg/dL (ref 0.3–1.2)
Total Protein: 7.3 g/dL (ref 6.5–8.1)

## 2019-01-10 LAB — LACTATE DEHYDROGENASE: LDH: 159 U/L (ref 98–192)

## 2019-01-10 LAB — PHOSPHORUS: Phosphorus: 2.5 mg/dL (ref 2.5–4.6)

## 2019-01-10 LAB — URIC ACID: Uric Acid, Serum: 5.2 mg/dL (ref 3.7–8.6)

## 2019-01-10 NOTE — Telephone Encounter (Signed)
Per 4/13 los, appt already scheduled.

## 2019-01-10 NOTE — Progress Notes (Signed)
Javier [name]    HEMATOLOGY/ONCOLOGY CLINIC NOTE  Date of Service: 01/10/19   Patient Care Team: Unk Pinto, MD as PCP - General (Internal Medicine) Inda Castle, MD (Inactive) as Consulting Physician (Gastroenterology) Melissa Noon, [place] as Referring Physician (Optometry) Danis, Kirke Corin, MD as Consulting Physician (Gastroenterology) Brunetta Genera, MD as Consulting Physician (Hematology)  CHIEF COMPLAINTS/PURPOSE OF CONSULTATION:  F/u for CD5 neg CLL on venetoclax ramp up  HISTORY OF PRESENTING ILLNESS:   Javier [name] is a wonderful 78 y.o. male who has been referred to [place] by Dr .Unk Pinto, MD for evaluation and management of lymphocytosis concerning for a lymphoproliferative process.  Patient has a h/o HTN, HLD, Prediabetes who notes that he had significant abdominal discomfort with significant dyspeptic symptoms around aug 2017 and had about 20-25lbs weight loss and had an extensive GI workup including CT abd/pelvis 05/25/2016 - WNL, EGD which showed chronic gastritis (helicobacter pylori +ve) and colonscopy with some polyps. Capsule endoscopy was not approved by insurance. Patients symptoms improved after treatment of H.pylori and he notes that he has gained back about 6-7 lbs and is eating better.  Patients labs were noted to show some leucocytosis/Lymphocytosis for which he was referred to [place] for further evaluation.  Patient notes some nightsweats and weight loss (as noted above, possible alternative explanation). No fevers/chills. Has not noted any overt enlarged LN and CT abd in 04/2016 showed no hepatomegaly or splenomegaly and no abd/RP LNadenopathy.  Patient notes no other acute focal symptoms.   INTERVAL HISTORY  Javier [name] is here for management and evaluation of his CLL . The patient was lost to follow up between 11/06/16 and 11/19/18. The patient's last visit with [place] was on 01/04/19. The pt reports that he is doing well overall.   The pt reports that he had  no problems tolerating the 56m Venetoclax last week. He denies nausea or worsened energy levels. The pt notes that he consumes at least 48oz per day. The pt notes that his night sweats in the last week have been stable and he denies any fevers or chills. He denies leg swelling.  Lab results today (01/10/19) of CBC w/diff and CMP is as follows: all values are WNL except for WBC at 54.7k, Lymphs abs at 50.8k, Abs immature granulocytes at 0.08k, Glucose at 117, Creatinine at 1.29. 01/10/19 Uric acid is at 5.2 01/10/19 LDH and Phosphorous are pending  On review of systems, pt reports stable energy levels, stable appetite, some night sweats, and denies nausea, worsened energy levels, fevers, chills, leg swelling, abdominal pains, and any other symptoms.   MEDICAL HISTORY:  Past Medical History:  Diagnosis Date  . Diverticulosis   . Hyperlipidemia   . Hypertension   . Prediabetes   . Vitamin D deficiency   Helicobacter pylori +ve   SURGICAL HISTORY: Past Surgical History:  Procedure Laterality Date  . NO PAST SURGERIES      SOCIAL HISTORY: Social History   Socioeconomic History  . Marital status: Married    Spouse name: Not on file  . Number of children: 5  . Years of education: Not on file  . Highest education level: Not on file  Occupational History  . Occupation: retired  SScientific laboratory technician . Financial resource strain: Not on file  . Food insecurity:    Worry: Not on file    Inability: Not on file  . Transportation needs:    Medical: Not on file    Non-medical: Not on file  Tobacco Use  . Smoking status: Former Smoker    Types: Cigarettes    Last attempt to quit: 01/17/2003    Years since quitting: 15.9  . Smokeless tobacco: Never Used  Substance and Sexual Activity  . Alcohol use: Yes    Comment: very rare  . Drug use: No  . Sexual activity: Not on file  Lifestyle  . Physical activity:    Days per week: Not on file    Minutes per session: Not on file  . Stress: Not on  file  Relationships  . Social connections:    Talks on phone: Not on file    Gets together: Not on file    Attends religious service: Not on file    Active member of club or organization: Not on file    Attends meetings of clubs or organizations: Not on file    Relationship status: Not on file  . Intimate partner violence:    Fear of current or ex partner: Not on file    Emotionally abused: Not on file    Physically abused: Not on file    Forced sexual activity: Not on file  Other Topics Concern  . Not on file  Social History Narrative  . Not on file  Quit smoking >10 yrs ago, previously 1.5Packs per week. Started smoking in his teens. Retired. Worked as a Chief Financial Officer.   FAMILY HISTORY: Family History  Problem Relation Age of Onset  . Stroke Mother   . Early death Father        Farm/tractor accident  . Diabetes Brother   . Cirrhosis Brother   . Colon cancer Paternal Grandfather     ALLERGIES:  is allergic to penicillins and ppd [tuberculin purified protein derivative].  MEDICATIONS:  Current Outpatient Medications  Medication Sig Dispense Refill  . allopurinol (ZYLOPRIM) 100 MG tablet Take 1 tablet (100 mg total) by mouth 2 (two) times daily. 60 tablet 2  . ALPRAZolam (XANAX) 0.5 MG tablet Take 1/2-1 tablet 2 - 3 x /day ONLY if needed for Anxiety Attack &  limit to 5 days /week to avoid addiction 90 tablet 0  . aspirin EC 81 MG tablet Take 81 mg by mouth daily.    . benazepril-hydrochlorthiazide (LOTENSIN HCT) 20-25 MG tablet TAKE 1 TABLET EVERY DAY FOR BLOOD PRESSURE (Patient taking differently: Take 1 tablet by mouth daily. ) 90 tablet 1  . Cholecalciferol (VITAMIN D3) 5000 units TABS Take 5,000 Units by mouth daily.    . pantoprazole (PROTONIX) 40 MG tablet Take 1 tablet (40 mg total) by mouth daily. (Patient not taking: Reported on 11/23/2018) 14 tablet 0  . pravastatin (PRAVACHOL) 40 MG tablet TAKE 1 TABLET AT BEDTIME  FOR  CHOLESTEROL (Patient taking differently:  Take 40 mg by mouth at bedtime. ) 90 tablet 3  . venetoclax (VENCLEXTA STARTING PACK) 10 & 50 & 100 MG TBPK Week1: Take 44m once daily. WCBSW9 Take 5106monce daily. WeQPRF1Take 10033mnce daily. WeeMBWG6ake 200m32mce daily. Take w food and water. 42 each 0   No current facility-administered medications for this visit.     REVIEW OF SYSTEMS:    A 10+ POINT REVIEW OF SYSTEMS WAS OBTAINED including neurology, dermatology, psychiatry, cardiac, respiratory, lymph, extremities, GI, GU, Musculoskeletal, constitutional, breasts, reproductive, HEENT.  All pertinent positives are noted in the HPI.  All others are negative.   PHYSICAL EXAMINATION: ECOG PERFORMANCE STATUS: 1 - Symptomatic but completely ambulatory  Vitals:   01/10/19 0855  BP: (!) 147/67  Pulse: 74  Resp: 19  Temp: 97.6 F (36.4 C)  SpO2: 99%   Filed Weights   01/10/19 0855  Weight: 181 lb 8 oz (82.3 kg)   .Body mass index is 25.31 kg/m.  GENERAL:alert, in no acute distress and comfortable SKIN: no acute rashes, no significant lesions EYES: conjunctiva are pink and non-injected, sclera anicteric OROPHARYNX: MMM, no exudates, no oropharyngeal erythema or ulceration NECK: supple, no JVD LYMPH:  no palpable lymphadenopathy in the cervical, axillary or inguinal regions LUNGS: clear to auscultation b/l with normal respiratory effort HEART: regular rate & rhythm ABDOMEN:  normoactive bowel sounds , non tender, not distended. No palpable hepatosplenomegaly.  Extremity: no pedal edema PSYCH: alert & oriented x 3 with fluent speech NEURO: no focal motor/sensory deficits   LABORATORY DATA:  I have reviewed the data as listed  . CBC Latest Ref Rng & Units 01/10/2019 01/04/2019 01/03/2019  WBC 4.0 - 10.5 K/uL 54.7(HH) 53.9(HH) 63.6(HH)  Hemoglobin 13.0 - 17.0 g/dL 13.6 12.6(L) 12.2(L)  Hematocrit 39.0 - 52.0 % 42.6 40.5 39.3  Platelets 150 - 400 K/uL 158 150 152   . CBC    Component Value Date/Time   WBC 54.7 (HH)  01/10/2019 0828   RBC 4.62 01/10/2019 0828   HGB 13.6 01/10/2019 0828   HGB 12.2 (L) 01/03/2019 1509   HGB 14.5 10/21/2016 1324   HCT 42.6 01/10/2019 0828   HCT 43.8 10/21/2016 1324   PLT 158 01/10/2019 0828   PLT 152 01/03/2019 1509   PLT 157 10/21/2016 1324   MCV 92.2 01/10/2019 0828   MCV 90.9 10/21/2016 1324   MCH 29.4 01/10/2019 0828   MCHC 31.9 01/10/2019 0828   RDW 13.9 01/10/2019 0828   RDW 13.3 10/21/2016 1324   LYMPHSABS 50.8 (H) 01/10/2019 0828   LYMPHSABS 17.6 (H) 10/21/2016 1324   MONOABS 0.7 01/10/2019 0828   MONOABS 0.6 10/21/2016 1324   EOSABS 0.1 01/10/2019 0828   EOSABS 0.0 10/21/2016 1324   BASOSABS 0.0 01/10/2019 0828   BASOSABS 0.1 10/21/2016 1324     . CMP Latest Ref Rng & Units 01/10/2019 01/04/2019 01/03/2019  Glucose 70 - 99 mg/dL 117(H) 82 126(H)  BUN 8 - 23 mg/dL _0 Creatinine 0.61 - 1.24 mg/dL 1.29(H) 1.30(H) 1.21  Sodium 135 - 145 mmol/L 140 145 142  Potassium 3.5 - 5.1 mmol/L 3.8 3.9 4.4  Chloride 98 - 111 mmol/L 107 110 109  CO2 22 - 32 mmol/L _1 Calcium 8.9 - 10.3 mg/dL 9.9 9.7 9.2  Total Protein 6.5 - 8.1 g/dL 7.3 7.0 6.6  Total Bilirubin 0.3 - 1.2 mg/dL 0.4 0.4 0.3  Alkaline Phos 38 - 126 U/L 65 67 65  AST 15 - 41 U/L 16 16 13(L)  ALT 0 - 44 U/L _2 . Lab Results  Component Value Date   LDH 192 01/04/2019         RADIOGRAPHIC STUDIES: I have personally reviewed the radiological images as listed and agreed with the findings in the report. No results found.  ASSESSMENT & PLAN:   78 y.o. 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 FISH panel showed p53 (17p13) deletion that would be consistent with this diagnosis . It often represents also be more aggressive form of CLL Differential diagnosis includes - CD5 neg CLL vs splenic lymphoma vs Marginal Zone lymphoma vs LPL (lymphoplasmacytic lymphoma) LDH WNL  suggests against a high grade process. Currently no  anemia or thrombocytopenia noted. Now with drenching night sweats and fatigue, no fevers/chills.  10/31/16 PET/CT scan shows some small hypermetabolic nodes in the thorax including bilateral hilar paratracheal) esophageal. These would be difficult to biopsy.  Lost to follow up between 11/06/16 and 11/19/18  12/01/18 PET/CT revealed Stable scattered small hypermetabolic thoracic lymph nodes, once again at [place] 5 activity level. 2. No findings of involvement in the neck, abdomen/pelvis, or skeleton. 3. Prostatomegaly. 4. Aortic Atherosclerosis and Emphysema 5. Sigmoid colon diverticulosis  PLAN -Discussed pt labwork today, 01/10/19; HGB improved to 13.6, WBC stable at 54.7k, chemistries stable. Uric acid normal at 5.2 -The pt has no prohibitive toxicities from increasing now to 137m Venetoclax each day -Pt has been able to consume at least 48-60 oz of water each day, and stressed that he should continue to do this, and we will hold IV fluids in light of this -Advised raising the head of his bed and not eating food within 2 hours of ideal bed time -Will watch for indications of beginning Gazyva, but aiming to start this later than sooner given Covid19 prevalence and hoping to avoid immunosuppression if this can be avoided  -Will continue to monitor labs weekly and see pt weekly during first month of treatment. -Did order acid suppressant that does not interact with Venetoclax -Discussed that I recommend following up with Dr. MMelford Aasefurther regarding dyspepsia and feeling of impending doom, and rule out cardiologic etiology -Recommend following up with PCP for prostatomegaly management and recommendations. -Will see the pt back in 1 week   2) . Patient Active Problem List   Diagnosis Date Noted  . BMI 24.0-24.9, adult 03/01/2018  . Anxiety 02/08/2018  . CLL (chronic lymphocytic leukemia) ([facility] 11/01/2016  . Encounter for Medicare annual wellness exam 09/11/2015  . GERD  02/09/2015  .  Medication management 11/02/2013  . Hyperlipidemia   . Hypertension   . Prediabetes   . Vitamin D deficiency   . Diverticulosis    -f/u with PCP for management of other medical co-morbids  3) H.pylori gastritis -f/u with GI to ensure eradication of h.pylori post treatment.   RTC as per appointments on 01/17/2019 with labs/MD/IVF   All of the patients questions were answered with apparent satisfaction. The patient knows to call the clinic with any problems, questions or concerns.  The total time spent in the appt was 20 minutes and more than 50% was on counseling and direct patient cares.    GSullivan LoneMD MS AAHIVMS SCentral [facility] Rehabiliation HospitalCPhysicians Behavioral HospitalHematology/Oncology Physician [facility] (Office):       [phone](Work cell):  [phone](Fax):           [phone] 01/10/2019 9:28 AM  I, SBaldwin Jamaica am acting as a scribe for Dr. GSullivan Lone   .I have reviewed the above documentation for accuracy and completeness, and I agree with the above. .Brunetta GeneraMD

## 2019-01-11 ENCOUNTER — Ambulatory Visit (INDEPENDENT_AMBULATORY_CARE_PROVIDER_SITE_OTHER): Payer: Medicare HMO | Admitting: Adult Health Nurse Practitioner

## 2019-01-11 ENCOUNTER — Encounter: Payer: Self-pay | Admitting: Adult Health Nurse Practitioner

## 2019-01-11 VITALS — BP 142/96 | HR 72 | Temp 97.8°F | Wt 182.2 lb

## 2019-01-11 DIAGNOSIS — K2100 Gastro-esophageal reflux disease with esophagitis, without bleeding: Secondary | ICD-10-CM

## 2019-01-11 DIAGNOSIS — R51 Headache: Secondary | ICD-10-CM | POA: Diagnosis not present

## 2019-01-11 DIAGNOSIS — K5900 Constipation, unspecified: Secondary | ICD-10-CM

## 2019-01-11 DIAGNOSIS — R519 Headache, unspecified: Secondary | ICD-10-CM

## 2019-01-11 DIAGNOSIS — C9110 Chronic lymphocytic leukemia of B-cell type not having achieved remission: Secondary | ICD-10-CM

## 2019-01-11 DIAGNOSIS — I10 Essential (primary) hypertension: Secondary | ICD-10-CM

## 2019-01-11 DIAGNOSIS — E782 Mixed hyperlipidemia: Secondary | ICD-10-CM

## 2019-01-11 DIAGNOSIS — C9190 Lymphoid leukemia, unspecified not having achieved remission: Secondary | ICD-10-CM | POA: Diagnosis not present

## 2019-01-11 MED ORDER — LOSARTAN POTASSIUM-HCTZ 100-25 MG PO TABS: 1.0000 | ORAL_TABLET | Freq: Every day | ORAL | 0 refills | Status: DC

## 2019-01-11 NOTE — Patient Instructions (Addendum)
Today you had a visit with Javier Sierras, DNP.  Below is a summary of your visit.  1. We are going to change your blood pressure medication to Hyzaar 12.5/100mg .  Take this once a day in the morning. STOP taking Lotensin HTC (Benzaepril-Hydrochloathiazide) 20/25mg . 2.  Monitor your blood pressure twice a day and write this down.  List the date and time of day. If you check your blood pressure first thing in the morning and it is high.  You can re-check this in 65min to 1hour after you take your medication. Goal for you is 140/90 or less.  If your blood pressure is continually above this, please call the office.  3. Decrease the salt in your diet.  Check labels on packaged foods and do not add salt to your foods.  This elevates your blood pressure. 4.  Continue drinking water.  You should drink 80oz of water a day  5. We will contact you to set up a phone appointment to discuss your blood pressure log.  6.  If you still have headache after these changes you make take Tylenol (Acetaminophen) 500mg , two tablets 1,000mg  every 8 hours as needed.   Follow up in one week with a phone visit.  Call or return with new or worsening symptoms as discussed in appointment.  May contact office via phone [phone] or [name].   Low-Sodium Eating Plan Sodium, which is an element that makes up salt, helps you maintain a healthy balance of fluids in your body. Too much sodium can increase your blood pressure and cause fluid and waste to be held in your body. Your health care provider or dietitian may recommend following this plan if you have high blood pressure (hypertension), kidney disease, liver disease, or heart failure. Eating less sodium can help lower your blood pressure, reduce swelling, and protect your heart, liver, and kidneys. What are tips for following this plan? General guidelines  Most people on this plan should limit their sodium intake to 1,500-2,000 mg (milligrams) of sodium each  day. Reading food labels   The Nutrition Facts label lists the amount of sodium in one serving of the food. If you eat more than one serving, you must multiply the listed amount of sodium by the number of servings.  Choose foods with less than 140 mg of sodium per serving.  Avoid foods with 300 mg of sodium or more per serving. Shopping  Look for lower-sodium products, often labeled as "low-sodium" or "no salt added."  Always check the sodium content even if foods are labeled as "unsalted" or "no salt added".  Buy fresh foods. ? Avoid canned foods and premade or frozen meals. ? Avoid canned, cured, or processed meats  Buy breads that have less than 80 mg of sodium per slice. Cooking  Eat more home-cooked food and less restaurant, buffet, and fast food.  Avoid adding salt when cooking. Use salt-free seasonings or herbs instead of table salt or sea salt. Check with your health care provider or pharmacist before using salt substitutes.  Cook with plant-based oils, such as canola, sunflower, or olive oil. Meal planning  When eating at a restaurant, ask that your food be prepared with less salt or no salt, if possible.  Avoid foods that contain MSG (monosodium glutamate). MSG is sometimes added to [place] food, bouillon, and some canned foods. What foods are recommended? The items listed may not be a complete list. Talk with your dietitian about what dietary choices are best for you.  Grains Low-sodium cereals, including oats, puffed wheat and rice, and shredded wheat. Low-sodium crackers. Unsalted rice. Unsalted pasta. Low-sodium bread. Whole-grain breads and whole-grain pasta. Vegetables Fresh or frozen vegetables. "No salt added" canned vegetables. "No salt added" tomato sauce and paste. Low-sodium or reduced-sodium tomato and vegetable juice. Fruits Fresh, frozen, or canned fruit. Fruit juice. Meats and other protein foods Fresh or frozen (no salt added) meat, poultry, seafood,  and fish. Low-sodium canned tuna and salmon. Unsalted nuts. Dried peas, beans, and lentils without added salt. Unsalted canned beans. Eggs. Unsalted nut butters. Dairy Milk. Soy milk. Cheese that is naturally low in sodium, such as ricotta cheese, fresh mozzarella, or Swiss cheese Low-sodium or reduced-sodium cheese. Cream cheese. Yogurt. Fats and oils Unsalted butter. Unsalted margarine with no trans fat. Vegetable oils such as canola or olive oils. Seasonings and other foods Fresh and dried herbs and spices. Salt-free seasonings. Low-sodium mustard and ketchup. Sodium-free salad dressing. Sodium-free light mayonnaise. Fresh or refrigerated horseradish. Lemon juice. Vinegar. Homemade, reduced-sodium, or low-sodium soups. Unsalted popcorn and pretzels. Low-salt or salt-free chips. What foods are not recommended? The items listed may not be a complete list. Talk with your dietitian about what dietary choices are best for you. Grains Instant hot cereals. Bread stuffing, pancake, and biscuit mixes. Croutons. Seasoned rice or pasta mixes. Noodle soup cups. Boxed or frozen macaroni and cheese. Regular salted crackers. Self-rising flour. Vegetables Sauerkraut, pickled vegetables, and relishes. Olives. [place] fries. Onion rings. Regular canned vegetables (not low-sodium or reduced-sodium). Regular canned tomato sauce and paste (not low-sodium or reduced-sodium). Regular tomato and vegetable juice (not low-sodium or reduced-sodium). Frozen vegetables in sauces. Meats and other protein foods Meat or fish that is salted, canned, smoked, spiced, or pickled. Bacon, ham, sausage, hotdogs, corned beef, chipped beef, packaged lunch meats, salt pork, jerky, pickled herring, anchovies, regular canned tuna, sardines, salted nuts. Dairy Processed cheese and cheese spreads. Cheese curds. Blue cheese. Feta cheese. String cheese. Regular cottage cheese. Buttermilk. Canned milk. Fats and oils Salted butter. Regular  margarine. Ghee. Bacon fat. Seasonings and other foods Onion salt, garlic salt, seasoned salt, table salt, and sea salt. Canned and packaged gravies. Worcestershire sauce. Tartar sauce. Barbecue sauce. Teriyaki sauce. Soy sauce, including reduced-sodium. Steak sauce. Fish sauce. Oyster sauce. Cocktail sauce. Horseradish that you find on the shelf. Regular ketchup and mustard. Meat flavorings and tenderizers. Bouillon cubes. Hot sauce and Tabasco sauce. Premade or packaged marinades. Premade or packaged taco seasonings. Relishes. Regular salad dressings. Salsa. Potato and tortilla chips. Corn chips and puffs. Salted popcorn and pretzels. Canned or dried soups. Pizza. Frozen entrees and pot pies. Summary  Eating less sodium can help lower your blood pressure, reduce swelling, and protect your heart, liver, and kidneys.  Most people on this plan should limit their sodium intake to 1,500-2,000 mg (milligrams) of sodium each day.  Canned, boxed, and frozen foods are high in sodium. Restaurant foods, fast foods, and pizza are also very high in sodium. You also get sodium by adding salt to food.  Try to cook at home, eat more fresh fruits and vegetables, and eat less fast food, canned, processed, or prepared foods. This information is not intended to replace advice given to you by your health care provider. Make sure you discuss any questions you have with your health care provider. Document Released: 03/07/2002 Document Revised: 09/08/2016 Document Reviewed: 09/08/2016 Elsevier Interactive Patient Education  2019 [employer].

## 2019-01-11 NOTE — Progress Notes (Signed)
Assessment and Plan:  There are no diagnoses linked to this encounter. Taivon was seen today for acute visit and fatigue.  Diagnoses and all orders for this visit:  Essential hypertension -     losartan-hydrochlorothiazide (HYZAAR) 100-25 MG tablet; Take 1 tablet by mouth daily. STOP Lotensin 20/25mg  Monitor blood pressure BID and record Follow up in one week to review this log Low sodium diet, provided printed education regarding this  Gastroesophageal reflux disease with esophagitis Doing well on Protonix Continue with benefit  CLL (chronic lymphocytic leukemia) ([place]) Follows with oncology, weekly Taking Venetoclax, on week three  Mixed hyperlipidemia Continue medication Doing well  Acute nonintractable headache, unspecified headache type Discussed dietary changes Increase water intake Discussed OTC Acetaminophen 500mg , two tablets every 8 hours as needed.  Do not take more the 3,000mg  in 24hour period. Discussed hospital precautions/symptoms Contact office with new or worsening symptoms  Constipation Increase water intake Using OTC phillips twice a week Discussed OTC Colace for this Increase activity, walking  Serous Otitis Discussed OTC Clairtin or Zyrtec for this   Further disposition pending results of labs. Discussed med's effects and SE's.   Over 30 minutes of exam, counseling, chart review, and critical decision making was performed.   Future Appointments  Date Time Provider [place]  01/17/2019  8:00 AM [facility] LAB 4 [facility] None  01/17/2019  8:40 AM Brunetta Genera, MD [facility] None  01/17/2019  9:15 AM [facility] INFUSION [facility] None  01/18/2019 11:45 AM Camila [place], Danton Sewer, NP [facility] None  01/24/2019  1:45 PM [facility] LAB 2 [facility] None  01/24/2019  2:20 PM Brunetta Genera, MD [facility] None  01/24/2019  3:00 PM [facility] INFUSION [facility] None     ------------------------------------------------------------------------------------------------------------------   HPI 78 y.o.male presents for evaluation of headache and blood pressure.  He has history of HTN, HLD, prediabetes, CLL, GERD, Anxiety, Vitamin D Deficiency and history of Diverticulosis with H.Pylori, post treatment.  Had an appointment yesterday with Oncology 01/10/19.  Repots no changes.  Taking Venetoclax, on week three.    Reports he does check his blood pressure at home but reports the readings are all over the place.  They range from 160/95 to 130/80's.  Trending uncontrolled hypertension for about two months.  Reports that he has started having a headache about three days ago that has been intermittent.  Does not associate a length of time with this.  Reports he has not taken anything for this.  Reports the headache is about a 3-4/10.  This does wake up him at night.  Also reports the headache has been constant since last night, dull, and did not go away when he woke this morning.  Denies any change in vision, photophobia, shortness of breath or chest pains.  He reports he does 4-16oz bottles of water a day.   OTC Phillips for constipation, once or twice a week.  Reports he does have a cough that is mainly at night or in the morning.  Reports he has taken benedryl for this with good results. Has only taken a few times.   Past Medical History:  Diagnosis Date  . Diverticulosis   . Hyperlipidemia   . Hypertension   . Prediabetes   . Vitamin D deficiency      Allergies  Allergen Reactions  . Penicillins Other (See Comments)    Unknown allergic reaction as a teenager Did it involve swelling of the face/tongue/throat, SOB, or low BP? Unknown Did it involve sudden or severe rash/hives, skin peeling,  or any reaction on the inside of your mouth or nose? Unknown Did you need to seek medical attention at a hospital or doctor's office? Unknown When did it last  happen?Teenager If all above answers are "NO", may proceed with cephalosporin use.   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 allopurinol (ZYLOPRIM) 100 MG tablet Take 1 tablet (100 mg total) by mouth 2 (two) times daily.  Marland Kitchen ALPRAZolam (XANAX) 0.5 MG tablet Take 1/2-1 tablet 2 - 3 x /day ONLY if needed for Anxiety Attack &  limit to 5 days /week to avoid addiction  . aspirin EC 81 MG tablet Take 81 mg by mouth daily.  . Cholecalciferol (VITAMIN D3) 5000 units TABS Take 5,000 Units by mouth daily.  . pantoprazole (PROTONIX) 40 MG tablet Take 1 tablet (40 mg total) by mouth daily.  . pravastatin (PRAVACHOL) 40 MG tablet TAKE 1 TABLET AT BEDTIME  FOR  CHOLESTEROL (Patient taking differently: Take 40 mg by mouth at bedtime. )  . venetoclax (VENCLEXTA STARTING PACK) 10 & 50 & 100 MG TBPK Week1: Take 20mg  once daily. ALPF7: Take 50mg  once daily. TKWI0: Take 100mg  once daily. XBDZ3: Take 200mg  once daily. Take w food and water.   No current facility-administered medications on file prior to visit.     ROS: Review of Systems  Constitutional: Negative for chills, diaphoresis, fever, malaise/fatigue and weight loss.  HENT: Positive for congestion. Negative for ear discharge, ear pain, hearing loss, nosebleeds, sinus pain, sore throat and tinnitus.   Eyes: Negative for blurred vision, double vision, photophobia, pain, discharge and redness.  Respiratory: Positive for cough and sputum production. Negative for hemoptysis, shortness of breath, wheezing and stridor.   Cardiovascular: Negative for chest pain, palpitations, orthopnea, claudication, leg swelling and PND.  Gastrointestinal: Positive for constipation. Negative for abdominal pain, blood in stool, diarrhea, heartburn, melena, nausea and vomiting.  Genitourinary: Negative for dysuria, flank pain, frequency, hematuria and urgency.   Musculoskeletal: Negative for back pain, falls, joint pain, myalgias and neck pain.  Skin: Negative for itching and rash.  Neurological: Positive for dizziness, tingling, tremors, sensory change, speech change, focal weakness, seizures, loss of consciousness and weakness. Negative for headaches.  Psychiatric/Behavioral: Positive for depression, hallucinations, memory loss, substance abuse and suicidal ideas. The patient is nervous/anxious and has insomnia.     Physical Exam:  BP (!) 142/96   Pulse 72   Temp 97.8 F (36.6 C)   Wt 182 lb 3.2 oz (82.6 kg)   SpO2 99%   BMI 25.41 kg/m   General Appearance: Well nourished, in no apparent distress. Eyes: PERRLA, EOMs, conjunctiva no swelling or erythema Sinuses: No Frontal/maxillary tenderness ENT/Mouth: Ext aud canals clear, TMs without erythema, bulging. Serous noted behind right TM. No erythema, swelling, or exudate on post pharynx.  Tonsils not swollen or erythematous. hard of hearing. Neck: Supple, thyroid normal.  Respiratory: Respiratory effort normal, BS equal bilaterally without rales, rhonchi, wheezing or stridor.  Cardio: RRR with no MRGs. Brisk peripheral pulses without edema.  Abdomen: Soft, + BS.  Non tender, no guarding, rebound, hernias, masses. Lymphatics: Non tender without lymphadenopathy.  Musculoskeletal: Full ROM, 5/5 strength, normal gait.  Skin: Warm, dry without rashes, lesions, ecchymosis.  Neuro: Cranial nerves intact. Normal muscle tone, no cerebellar symptoms. Sensation intact.  Psych: Awake and oriented X 3, normal affect, Insight and Judgment appropriate.  [name], NP 11:36 AM [facility]

## 2019-01-15 ENCOUNTER — Other Ambulatory Visit: Payer: Self-pay | Admitting: Hematology

## 2019-01-15 DIAGNOSIS — C9110 Chronic lymphocytic leukemia of B-cell type not having achieved remission: Secondary | ICD-10-CM

## 2019-01-17 ENCOUNTER — Telehealth: Payer: Self-pay | Admitting: Pharmacist

## 2019-01-17 ENCOUNTER — Inpatient Hospital Stay (HOSPITAL_BASED_OUTPATIENT_CLINIC_OR_DEPARTMENT_OTHER): Payer: Medicare HMO | Admitting: Hematology

## 2019-01-17 ENCOUNTER — Telehealth: Payer: Self-pay | Admitting: Hematology

## 2019-01-17 ENCOUNTER — Inpatient Hospital Stay: Payer: Medicare HMO

## 2019-01-17 ENCOUNTER — Other Ambulatory Visit: Payer: Self-pay

## 2019-01-17 VITALS — BP 152/85 | HR 75 | Temp 98.4°F | Resp 17 | Ht 71.0 in | Wt 181.9 lb

## 2019-01-17 DIAGNOSIS — K5730 Diverticulosis of large intestine without perforation or abscess without bleeding: Secondary | ICD-10-CM | POA: Diagnosis not present

## 2019-01-17 DIAGNOSIS — Z79899 Other long term (current) drug therapy: Secondary | ICD-10-CM | POA: Diagnosis not present

## 2019-01-17 DIAGNOSIS — I10 Essential (primary) hypertension: Secondary | ICD-10-CM | POA: Diagnosis not present

## 2019-01-17 DIAGNOSIS — Z9189 Other specified personal risk factors, not elsewhere classified: Secondary | ICD-10-CM

## 2019-01-17 DIAGNOSIS — R5383 Other fatigue: Secondary | ICD-10-CM | POA: Diagnosis not present

## 2019-01-17 DIAGNOSIS — R7303 Prediabetes: Secondary | ICD-10-CM | POA: Diagnosis not present

## 2019-01-17 DIAGNOSIS — C9110 Chronic lymphocytic leukemia of B-cell type not having achieved remission: Secondary | ICD-10-CM | POA: Diagnosis not present

## 2019-01-17 DIAGNOSIS — R61 Generalized hyperhidrosis: Secondary | ICD-10-CM | POA: Diagnosis not present

## 2019-01-17 DIAGNOSIS — E785 Hyperlipidemia, unspecified: Secondary | ICD-10-CM | POA: Diagnosis not present

## 2019-01-17 DIAGNOSIS — E559 Vitamin D deficiency, unspecified: Secondary | ICD-10-CM | POA: Diagnosis not present

## 2019-01-17 DIAGNOSIS — N400 Enlarged prostate without lower urinary tract symptoms: Secondary | ICD-10-CM | POA: Diagnosis not present

## 2019-01-17 LAB — CBC WITH DIFFERENTIAL/PLATELET
Abs Immature Granulocytes: 0.04 10*3/uL (ref 0.00–0.07)
Basophils Absolute: 0 10*3/uL (ref 0.0–0.1)
Basophils Relative: 0 %
Eosinophils Absolute: 0 10*3/uL (ref 0.0–0.5)
Eosinophils Relative: 0 %
HCT: 39.9 % (ref 39.0–52.0)
Hemoglobin: 12.5 g/dL — ABNORMAL LOW (ref 13.0–17.0)
Immature Granulocytes: 0 %
Lymphocytes Relative: 93 %
Lymphs Abs: 48.8 10*3/uL — ABNORMAL HIGH (ref 0.7–4.0)
MCH: 29.4 pg (ref 26.0–34.0)
MCHC: 31.3 g/dL (ref 30.0–36.0)
MCV: 93.9 fL (ref 80.0–100.0)
Monocytes Absolute: 0.8 10*3/uL (ref 0.1–1.0)
Monocytes Relative: 2 %
Neutro Abs: 2.6 10*3/uL (ref 1.7–7.7)
Neutrophils Relative %: 5 %
Platelets: 166 10*3/uL (ref 150–400)
RBC: 4.25 MIL/uL (ref 4.22–5.81)
RDW: 13.7 % (ref 11.5–15.5)
WBC: 52.2 10*3/uL (ref 4.0–10.5)
nRBC: 0 % (ref 0.0–0.2)

## 2019-01-17 LAB — CMP (CANCER CENTER ONLY)
ALT: 11 U/L (ref 0–44)
AST: 14 U/L — ABNORMAL LOW (ref 15–41)
Albumin: 3.7 g/dL (ref 3.5–5.0)
Alkaline Phosphatase: 61 U/L (ref 38–126)
Anion gap: 11 (ref 5–15)
BUN: 15 mg/dL (ref 8–23)
CO2: 23 mmol/L (ref 22–32)
Calcium: 9.4 mg/dL (ref 8.9–10.3)
Chloride: 107 mmol/L (ref 98–111)
Creatinine: 1.26 mg/dL — ABNORMAL HIGH (ref 0.61–1.24)
GFR, Est AFR Am: >60 mL/min (ref >60)
GFR, Estimated: 55 mL/min — ABNORMAL LOW (ref >60)
Glucose, Bld: 97 mg/dL (ref 70–99)
Potassium: 3.8 mmol/L (ref 3.5–5.1)
Sodium: 141 mmol/L (ref 135–145)
Total Bilirubin: 0.6 mg/dL (ref 0.3–1.2)
Total Protein: 6.8 g/dL (ref 6.5–8.1)

## 2019-01-17 LAB — URIC ACID: Uric Acid, Serum: 4.8 mg/dL (ref 3.7–8.6)

## 2019-01-17 MED ORDER — VENETOCLAX 100 MG PO TABS: 400.0000 mg | ORAL_TABLET | Freq: Every day | ORAL | 2 refills | Status: DC

## 2019-01-17 NOTE — Telephone Encounter (Signed)
Per 4/20 los appt already scheduled.

## 2019-01-17 NOTE — Progress Notes (Signed)
HEMATOLOGY/ONCOLOGY CLINIC NOTE  Date of Service: 01/17/19   Patient Care Team: Unk Pinto, MD as PCP - General (Internal Medicine) Inda Castle, MD (Inactive) as Consulting Physician (Gastroenterology) Melissa Noon, [place] as Referring Physician (Optometry) Danis, Kirke Corin, MD as Consulting Physician (Gastroenterology) Brunetta Genera, MD as Consulting Physician (Hematology)  CHIEF COMPLAINTS/PURPOSE OF CONSULTATION:  F/u for CD5 neg CLL   HISTORY OF PRESENTING ILLNESS:   Javier [name] is a wonderful 78 y.o. male who has been referred to [place] by Dr .Unk Pinto, MD for evaluation and management of lymphocytosis concerning for a lymphoproliferative process.  Patient has a h/o HTN, HLD, Prediabetes who notes that he had significant abdominal discomfort with significant dyspeptic symptoms around aug 2017 and had about 20-25lbs weight loss and had an extensive GI workup including CT abd/pelvis 05/25/2016 - WNL, EGD which showed chronic gastritis (helicobacter pylori +ve) and colonscopy with some polyps. Capsule endoscopy was not approved by insurance. Patients symptoms improved after treatment of H.pylori and he notes that he has gained back about 6-7 lbs and is eating better.  Patients labs were noted to show some leucocytosis/Lymphocytosis for which he was referred to [place] for further evaluation.  Patient notes some nightsweats and weight loss (as noted above, possible alternative explanation). No fevers/chills. Has not noted any overt enlarged LN and CT abd in 04/2016 showed no hepatomegaly or splenomegaly and no abd/RP LNadenopathy.  Patient notes no other acute focal symptoms.   INTERVAL HISTORY  Javier [name] is here for management and evaluation of his CLL . The patient was lost to follow up between 11/06/16 and 11/19/18. The patient's last visit with [place] was on 01/10/19. The pt reports that he is doing well overall.   The pt reports that he has continued to have  night sweats, which he notes are not quite as bad as they were previously. He just finished week 3 of his Venetoclax. He notes that he took 128m Venetoclax without difficulty last week. The pt notes that he drinks "quite a bit of water," each day consuming 4-5 16oz bottles. The pt notes that he is eating well and sleeping well.  Lab results today (01/17/19) of CBC w/diff and CMP is as follows: all values are WNL except for WBC at 52.2k, HGB at 12.5, Lymphs abs at 48.8k, Creatinine at 1.26, AST at 14. 01/17/19 Uric acid at 4.8  On review of systems, pt reports stable energy levels, night sweats, staying hydrated, eating well, sleeping well, and denies abdominal pains, noticing any new lumps or bumps, leg swelling, and any other symptoms.   MEDICAL HISTORY:  Past Medical History:  Diagnosis Date  . Diverticulosis   . Hyperlipidemia   . Hypertension   . Prediabetes   . Vitamin D deficiency   Helicobacter pylori +ve   SURGICAL HISTORY: Past Surgical History:  Procedure Laterality Date  . NO PAST SURGERIES      SOCIAL HISTORY: Social History   Socioeconomic History  . Marital status: Married    Spouse name: Not on file  . Number of children: 5  . Years of education: Not on file  . Highest education level: Not on file  Occupational History  . Occupation: retired  SScientific laboratory technician . Financial resource strain: Not on file  . Food insecurity:    Worry: Not on file    Inability: Not on file  . Transportation needs:    Medical: Not on file    Non-medical:  Not on file  Tobacco Use  . Smoking status: Former Smoker    Types: Cigarettes    Last attempt to quit: 01/17/2003    Years since quitting: 16.0  . Smokeless tobacco: Never Used  Substance and Sexual Activity  . Alcohol use: Yes    Comment: very rare  . Drug use: No  . Sexual activity: Not on file  Lifestyle  . Physical activity:    Days per week: Not on file    Minutes per session: Not on file  . Stress: Not on file   Relationships  . Social connections:    Talks on phone: Not on file    Gets together: Not on file    Attends religious service: Not on file    Active member of club or organization: Not on file    Attends meetings of clubs or organizations: Not on file    Relationship status: Not on file  . Intimate partner violence:    Fear of current or ex partner: Not on file    Emotionally abused: Not on file    Physically abused: Not on file    Forced sexual activity: Not on file  Other Topics Concern  . Not on file  Social History Narrative  . Not on file  Quit smoking >10 yrs ago, previously 1.5Packs per week. Started smoking in his teens. Retired. Worked as a Chief Financial Officer.   FAMILY HISTORY: Family History  Problem Relation Age of Onset  . Stroke Mother   . Early death Father        Farm/tractor accident  . Diabetes Brother   . Cirrhosis Brother   . Colon cancer Paternal Grandfather     ALLERGIES:  is allergic to penicillins and ppd [tuberculin purified protein derivative].  MEDICATIONS:  Current Outpatient Medications  Medication Sig Dispense Refill  . allopurinol (ZYLOPRIM) 100 MG tablet Take 1 tablet (100 mg total) by mouth 2 (two) times daily. 60 tablet 2  . ALPRAZolam (XANAX) 0.5 MG tablet Take 1/2-1 tablet 2 - 3 x /day ONLY if needed for Anxiety Attack &  limit to 5 days /week to avoid addiction 90 tablet 0  . aspirin EC 81 MG tablet Take 81 mg by mouth daily.    . Cholecalciferol (VITAMIN D3) 5000 units TABS Take 5,000 Units by mouth daily.    Marland Kitchen losartan-hydrochlorothiazide (HYZAAR) 100-25 MG tablet Take 1 tablet by mouth daily. 30 tablet 0  . pantoprazole (PROTONIX) 40 MG tablet Take 1 tablet (40 mg total) by mouth daily. 14 tablet 0  . pravastatin (PRAVACHOL) 40 MG tablet TAKE 1 TABLET AT BEDTIME  FOR  CHOLESTEROL (Patient taking differently: Take 40 mg by mouth at bedtime. ) 90 tablet 3  . venetoclax (VENCLEXTA STARTING PACK) 10 & 50 & 100 MG TBPK Week1: Take 48m once  daily. WBSJG2 Take 549monce daily. WeEZMO2Take 10022mnce daily. WeeHUTM5ake 200m68mce daily. Take w food and water. 42 each 0   No current facility-administered medications for this visit.     REVIEW OF SYSTEMS:    A 10+ POINT REVIEW OF SYSTEMS WAS OBTAINED including neurology, dermatology, psychiatry, cardiac, respiratory, lymph, extremities, GI, GU, Musculoskeletal, constitutional, breasts, reproductive, HEENT.  All pertinent positives are noted in the HPI.  All others are negative.   PHYSICAL EXAMINATION: ECOG PERFORMANCE STATUS: 1 - Symptomatic but completely ambulatory  Vitals:   01/17/19 0902  BP: (!) 152/85  Pulse: 75  Resp: 17  Temp: 98.4 F (36.9  C)  SpO2: 99%   Filed Weights   01/17/19 0902  Weight: 181 lb 14.4 oz (82.5 kg)   .Body mass index is 25.37 kg/m.  GENERAL:alert, in no acute distress and comfortable SKIN: no acute rashes, no significant lesions EYES: conjunctiva are pink and non-injected, sclera anicteric OROPHARYNX: MMM, no exudates, no oropharyngeal erythema or ulceration NECK: supple, no JVD LYMPH:  no palpable lymphadenopathy in the cervical, axillary or inguinal regions LUNGS: clear to auscultation b/l with normal respiratory effort HEART: regular rate & rhythm ABDOMEN:  normoactive bowel sounds , non tender, not distended. No palpable hepatosplenomegaly.  Extremity: no pedal edema PSYCH: alert & oriented x 3 with fluent speech NEURO: no focal motor/sensory deficits   LABORATORY DATA:  I have reviewed the data as listed  . CBC Latest Ref Rng & Units 01/17/2019 01/10/2019 01/04/2019  WBC 4.0 - 10.5 K/uL 52.2(HH) 54.7(HH) 53.9(HH)  Hemoglobin 13.0 - 17.0 g/dL 12.5(L) 13.6 12.6(L)  Hematocrit 39.0 - 52.0 % 39.9 42.6 40.5  Platelets 150 - 400 K/uL 166 158 150   . CBC    Component Value Date/Time   WBC 52.2 (HH) 01/17/2019 0834   RBC 4.25 01/17/2019 0834   HGB 12.5 (L) 01/17/2019 0834   HGB 12.2 (L) 01/03/2019 1509   HGB 14.5 10/21/2016  1324   HCT 39.9 01/17/2019 0834   HCT 43.8 10/21/2016 1324   PLT 166 01/17/2019 0834   PLT 152 01/03/2019 1509   PLT 157 10/21/2016 1324   MCV 93.9 01/17/2019 0834   MCV 90.9 10/21/2016 1324   MCH 29.4 01/17/2019 0834   MCHC 31.3 01/17/2019 0834   RDW 13.7 01/17/2019 0834   RDW 13.3 10/21/2016 1324   LYMPHSABS 48.8 (H) 01/17/2019 0834   LYMPHSABS 17.6 (H) 10/21/2016 1324   MONOABS 0.8 01/17/2019 0834   MONOABS 0.6 10/21/2016 1324   EOSABS 0.0 01/17/2019 0834   EOSABS 0.0 10/21/2016 1324   BASOSABS 0.0 01/17/2019 0834   BASOSABS 0.1 10/21/2016 1324     . CMP Latest Ref Rng & Units 01/17/2019 01/10/2019 01/04/2019  Glucose 70 - 99 mg/dL 97 117(H) 82  BUN 8 - 23 mg/dL '15 21 15  ' Creatinine 0.61 - 1.24 mg/dL 1.26(H) 1.29(H) 1.30(H)  Sodium 135 - 145 mmol/L 141 140 145  Potassium 3.5 - 5.1 mmol/L 3.8 3.8 3.9  Chloride 98 - 111 mmol/L 107 107 110  CO2 22 - 32 mmol/L '23 22 26  ' Calcium 8.9 - 10.3 mg/dL 9.4 9.9 9.7  Total Protein 6.5 - 8.1 g/dL 6.8 7.3 7.0  Total Bilirubin 0.3 - 1.2 mg/dL 0.6 0.4 0.4  Alkaline Phos 38 - 126 U/L 61 65 67  AST 15 - 41 U/L 14(L) 16 16  ALT 0 - 44 U/L '11 11 14   ' . Lab Results  Component Value Date   LDH 159 01/10/2019         RADIOGRAPHIC STUDIES: I have personally reviewed the radiological images as listed and agreed with the findings in the report. No results found.  ASSESSMENT & PLAN:   78 y.o. 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 FISH panel showed p53 (17p13) deletion that would be consistent with this diagnosis . It often represents also be more aggressive form of CLL Differential diagnosis includes - CD5 neg CLL vs splenic lymphoma vs Marginal Zone lymphoma vs LPL (lymphoplasmacytic lymphoma) LDH WNL suggests against a high grade process. Currently no anemia or thrombocytopenia noted. Now  with drenching night sweats and fatigue, no fevers/chills.  10/31/16 PET/CT scan shows  some small hypermetabolic nodes in the thorax including bilateral hilar paratracheal) esophageal. These would be difficult to biopsy.  Lost to follow up between 11/06/16 and 11/19/18  12/01/18 PET/CT revealed Stable scattered small hypermetabolic thoracic lymph nodes, once again at [place] 5 activity level. 2. No findings of involvement in the neck, abdomen/pelvis, or skeleton. 3. Prostatomegaly. 4. Aortic Atherosclerosis and Emphysema 5. Sigmoid colon diverticulosis  PLAN -Discussed pt labwork today, 01/17/19; WBC at 52.2k, HGB at 12.5, Uric acid normal at 4.8, chemistries stable -The pt has no prohibitive toxicities from increasing to 223m Venetoclax this week, at this time. -Will consider adding Gazyva after pt reaches 4091mVenetoclax without prohibitive toxicities -Pt has been able to consume at least 48-60 oz of water each day, and stressed that he should continue to do this, and we will hold IV fluids in light of this -Advised raising the head of his bed and not eating food within 2 hours of ideal bed time -Will watch for indications of beginning Gazyva, but aiming to start this later than sooner given Covid19 prevalence and hoping to avoid immunosuppression if this can be avoided. -Will continue to monitor labs weekly and see pt weekly during first month of treatment. -Discussed that I recommend following up with Dr. McMelford Aaseurther regarding dyspepsia and feeling of impending doom, and rule out cardiologic etiology -Recommend following up with PCP for prostatomegaly management and recommendations. -Will see the pt back in 1 week   2) . Patient Active Problem List   Diagnosis Date Noted  . BMI 24.0-24.9, adult 03/01/2018  . Anxiety 02/08/2018  . C[place] (ch[place] lymphocytic [place]) ([place]  . Encounter for Medicare annual wellness exam 09/11/2015  . GERD  02/09/2015  . Medication management 11/02/2013  . Hyperlipidemia   . Hypertension   . Prediabetes   . Vitamin D  deficiency   . Diverticulosis    -f/u with PCP for management of other medical co-morbids  3) H.pylori gastritis -f/u with GI to ensure eradication of h.pylori post treatment.   RTC with Rekita Miotke in 1 week with labs as scheduled on 4/27   All of the patients questions were answered with apparent satisfaction. The patient knows to call the clinic with any problems, questions or concerns.  The total time spent in the appt was 25 minutes and more than 50% was on counseling and direct patient cares.    GaSullivan LoneD MS AAHIVMS SCLds HospitalTEdgerton Hospital And Health Servicesematology/Oncology Physician CoRiverside Medical Center(Office):       [phone] cell):  [phone]):           [phone]/20/2020 9:54 AM  I, ScBaldwin Jamaicaam acting as a scribe for Dr. GaSullivan Lone  .I have reviewed the above documentation for accuracy and completeness, and I agree with the above. .GBrunetta GeneraD

## 2019-01-17 NOTE — Progress Notes (Deleted)
Virtual Visit via Telephone Note  I connected with@ on 01/17/19 at 11:45 AM EDT by telephone and verified that I am speaking with the correct person using two identifiers.   I discussed the limitations, risks, security and privacy concerns of performing an evaluation and management service by telephone and the availability of in person appointments. I also discussed with the patient that there may be a patient responsible charge related to this service. The patient expressed understanding and agreed to proceed.   Assessment and Plan: Diagnoses and all orders for this visit:  Essential hypertension  Gastroesophageal reflux disease with esophagitis  Anxiety  BMI 24.0-24.9, adult  Medication management     Follow Up Instructions:    I discussed the assessment and treatment plan with the patient. The patient was provided an opportunity to ask questions and all were answered. The patient agreed with the plan and demonstrated an understanding of the instructions.   The patient was advised to call back or seek an in-person evaluation if the symptoms worsen or if the condition fails to improve as anticipated.  I provided *** minutes of non-face-to-face time during this encounter including counseling, chart review, and critical decision making was preformed.   Future Appointments  Date Time Provider [place]  01/18/2019 11:45 AM [name], NP [facility] None  01/24/2019  1:45 PM [facility] LAB 2 [facility] None  01/24/2019  2:20 PM Brunetta Genera, MD [facility] None  01/24/2019  3:00 PM [facility] INFUSION [facility] None      History of Present Illness:  78 y.o. male presents for follow up on hypertension, cholesterol, diabetes, weight and vitamin D deficiency.     Medical History:  Past Medical History:  Diagnosis Date  . Diverticulosis   . Hyperlipidemia   . Hypertension   . Prediabetes   . Vitamin D deficiency     Current Medications:   Current Outpatient Medications on File Prior to Visit  Medication Sig  . allopurinol (ZYLOPRIM) 100 MG tablet Take 1 tablet (100 mg total) by mouth 2 (two) times daily.  Marland Kitchen ALPRAZolam (XANAX) 0.5 MG tablet Take 1/2-1 tablet 2 - 3 x /day ONLY if needed for Anxiety Attack &  limit to 5 days /week to avoid addiction  . aspirin EC 81 MG tablet Take 81 mg by mouth daily.  . Cholecalciferol (VITAMIN D3) 5000 units TABS Take 5,000 Units by mouth daily.  Marland Kitchen losartan-hydrochlorothiazide (HYZAAR) 100-25 MG tablet Take 1 tablet by mouth daily.  . pantoprazole (PROTONIX) 40 MG tablet Take 1 tablet (40 mg total) by mouth daily.  . pravastatin (PRAVACHOL) 40 MG tablet TAKE 1 TABLET AT BEDTIME  FOR  CHOLESTEROL (Patient taking differently: Take 40 mg by mouth at bedtime. )  . [START ON 01/24/2019] venetoclax 100 MG TABS Take 400 mg by mouth daily. Take with food and water   No current facility-administered medications on file prior to visit.     Allergies:  Allergies  Allergen Reactions  . Penicillins Other (See Comments)    Unknown allergic reaction as a teenager Did it involve swelling of the face/tongue/throat, SOB, or low BP? Unknown Did it involve sudden or severe rash/hives, skin peeling, or any reaction on the inside of your mouth or nose? Unknown Did you need to seek medical attention at a hospital or doctor's office? Unknown When did it last happen?Teenager If all above answers are "NO", may proceed with cephalosporin use.   Marland Kitchen Ppd [Tuberculin Purified Protein Derivative]     Positive  test in 2004      Review of Systems:  ROS   Observations/Objective: General : Well sounding patient in no apparent distress HEENT: no hoarseness, no cough for duration of visit Lungs: speaks in complete sentences, no audible wheezing, no apparent distress Neurological: alert, oriented x 3 Psychiatric: pleasant, judgement appropriate      [name], NP [facility]  [facility] 01/17/2019  2:48 PM ~

## 2019-01-17 NOTE — Progress Notes (Signed)
Dr. Irene Limbo aware of WBC 52.2

## 2019-01-17 NOTE — Telephone Encounter (Signed)
Oral Chemotherapy Pharmacist Encounter   Spoke with patient's wife, [name], today to follow up regarding patient's oral chemotherapy medication: Venclexta (venetoclax)for the treatment of chronic lymphocytic leukemia with p53 mutation and 17p deletion, planned durationuntil disease progression or unacceptable toxicity.  Original Start date of oral chemotherapy: 12/20/18 x 1 dose, 12/27/18 start of daily dosing  Patient has initiated Venclexta on a titration schedule. In brief: Week 1 start 3/30: 52m by mouth once daily using 156mtablets Week 2 start 4/6: 5064my mouth once daily with food and water using 64m68mblets Week 3 start 4/13: 100mg34mmouth once daily with food and water using 100mg 22mets Week 4 start 4/20: 200mg b62muth once daily with food and water using 100mg ta5ms  Patient has missed 0 doses of Venclexta since 3/30  Patient will initiate Venclexta 400mg by 66mh once daily with food and water using 100mg tabl77mon 01/24/19. This is the target dosing of Venclexta. Gazyva mayDyann Kiefded in the future pending results and toleration of 400mg daily82mclexta.  New prescription for Venclexta 100mg tablet58mtle [facility] to their home on Thursday 4/23. Patient will start this bottle on Monday 4/27.  Pt reports the following side effects: no ADEs to report, no signs of TLS  Pertinent labs reviewed: OK for continued treatment, CBC results from today discussed with Mrs. Dobis.  I confirmed office visits for 4/27 with Mrs. Wegman.  They know to call the office with questions or concerns. Oral Oncology Clinic will continue to follow.  Jesse Vada Swift, Johny DrillingPS, BCOP  01/17/2019 10:55 AM Oral Oncology Clinic [phone]

## 2019-01-18 ENCOUNTER — Ambulatory Visit: Payer: Medicare HMO | Admitting: Adult Health Nurse Practitioner

## 2019-01-19 MED FILL — VENCLEXTA 100 MG TABS: 100 | 30 days supply | Qty: 120 | Fill #0

## 2019-01-20 NOTE — Progress Notes (Signed)
MEDICARE ANNUAL WELLNESS VISIT AND FOLLOW UP  THIS ENCOUNTER IS A VIRTUAL/TELEPHONE VISIT DUE TO COVID-19 - PATIENT WAS NOT SEEN IN THE OFFICE.  PATIENT HAS CONSENTED TO VIRTUAL VISIT / TELEMEDICINE VISIT  This provider placed a call to [place] using telephone, his appointment was changed to a virtual office visit to reduce the risk of exposure to the COVID-19 virus and to help [employer] remain healthy and safe. The virtual visit will also provide continuity of care. He verbalizes understanding.   Assessment:   Headache nasonex sent in, add on claritin Add on norvasc for BP Continue to drink plenty of water No acute symptoms/red flags Will go to the ER if worsening headache, changes vision/speech, imbalance, weakness. ? If dehydrated and need to cut back on HCTZ versus CLL medication  Encounter for Medicare annual wellness exam 1 YEAR  Essential hypertension Continue medication Will add on low dose norvasc 2.5 mg at night Call in 2 weeks to check progress Monitor blood pressure at home; call if consistently over 130/80 Continue DASH diet.   Reminder to go to the ER if any CP, SOB, nausea, dizziness, severe HA, changes vision/speech, left arm numbness and tingling and jaw pain.  Gastroesophageal reflux disease with esophagitis Not taking meds at this time, we will send in prilosec 40mg  and he will take daily Discussed diet, avoiding triggers and other lifestyle changes  Diverticulosis Overdue for follow up with Dr. Deatra Ina  Vitamin D deficiency At goal at recent check; continue to recommend supplementation for goal of 70-100 Defer vitamin D level  Prediabetes Recent A1Cs at goal Discussed diet/exercise, weight management  Defer A1C; check BMP when able  Medication management CBC, CMP/GFR, magnesium when able  Mixed hyperlipidemia Continue medications: pravastatin Continue low cholesterol diet and exercise.  Check lipid panel.   CLL (chronic  lymphocytic leukemia) ([place])  followed by Dr. Irene Limbo  Anxiety Well managed by current regimen; continue medications Stress management techniques discussed, increase water, good sleep hygiene discussed, increase exercise, and increase veggies.     Over 30 minutes of exam, counseling, chart review, and critical decision making was performed  Future Appointments  Date Time Provider [place]  01/21/2019  9:00 AM Vicie Mutters, PA-C [facility] None  01/24/2019  1:45 PM [facility] LAB 2 [facility] None  01/24/2019  2:20 PM Brunetta Genera, MD [facility] None  01/24/2019  3:00 PM [facility] INFUSION [facility] None     Plan:   During the course of the visit the patient was educated and counseled about appropriate screening and preventive services including:    Pneumococcal vaccine   Influenza vaccine  Prevnar 13  Td vaccine  Screening electrocardiogram  Colorectal cancer screening  Diabetes screening  Glaucoma screening  Nutrition counseling    Subjective:  Javier [name] is a 78 y.o. male who presents for Medicare Annual Wellness Visit and 3 month follow up for HTN, hyperlipidemia, glucose management, and vitamin D Def.   He's also followed by Dr Irene Limbo for smouldering Stage 0 CLL. aking Venetoclax, on week three.    he has a diagnosis of anxiety and is currently on xanax 0.25 mg QID PRN, reports symptoms are well controlled on current regimen.  BMI is Body mass index is 25.1 kg/m., he has been working on diet but doesn't currently regularly exercise.  Wt Readings from Last 3 Encounters:  01/21/19 180 lb (81.6 kg)  01/17/19 181 lb 14.4 oz (82.5 kg)  01/11/19 182 lb 3.2 oz (82.6 kg)  His blood pressure has been controlled at home, today their BP is BP: (!) 144/98 HIS ACE WAS RECENTLY SWITCH TO ARB DUE TO ELEVATED BP AND HEADACHE. He continues to have a headache describes it like a "swimming or feels full all the time", he feels a little weak when he  first gets up and has night sweats. He takes benadryl occ, wife states he has sinus issues.   He does not workout. He denies chest pain, shortness of breath, dizziness.   He is on cholesterol medication (pravastatin 40 mg daily) and denies myalgias. His cholesterol is not at goal. The cholesterol last visit was:   Lab Results  Component Value Date   CHOL 188 09/15/2018   HDL 41 09/15/2018   LDLCALC 114 (H) 09/15/2018   TRIG 214 (H) 09/15/2018   CHOLHDL 4.6 09/15/2018   He has been working on diet and exercise for glucose management, and denies foot ulcerations, increased appetite, nausea, paresthesia of the feet, polydipsia, polyuria, visual disturbances, vomiting and weight loss. Last A1C in the office was:  Lab Results  Component Value Date   HGBA1C 5.4 09/15/2018   Last GFR Lab Results  Component Value Date   GFRNONAA 55 (L) 01/17/2019   Patient is on Vitamin D supplement.   Lab Results  Component Value Date   VD25OH 66 06/09/2018      Medication Review:   Current Outpatient Medications (Cardiovascular):  .  losartan-hydrochlorothiazide (HYZAAR) 100-25 MG tablet, Take 1 tablet by mouth daily. .  pravastatin (PRAVACHOL) 40 MG tablet, TAKE 1 TABLET AT BEDTIME  FOR  CHOLESTEROL (Patient taking differently: Take 40 mg by mouth at bedtime. )   Current Outpatient Medications (Analgesics):  .  allopurinol (ZYLOPRIM) 100 MG tablet, Take 1 tablet (100 mg total) by mouth 2 (two) times daily. Marland Kitchen  aspirin EC 81 MG tablet, Take 81 mg by mouth daily.   Current Outpatient Medications (Other):  Marland Kitchen  ALPRAZolam (XANAX) 0.5 MG tablet, Take 1/2-1 tablet 2 - 3 x /day ONLY if needed for Anxiety Attack &  limit to 5 days /week to avoid addiction .  Cholecalciferol (VITAMIN D3) 5000 units TABS, Take 5,000 Units by mouth daily. .  pantoprazole (PROTONIX) 40 MG tablet, Take 1 tablet (40 mg total) by mouth daily. Derrill Memo ON 01/24/2019] venetoclax 100 MG TABS, Take 400 mg by mouth daily. Take  with food and water  Allergies: Allergies  Allergen Reactions  . Penicillins Other (See Comments)    Unknown allergic reaction as a teenager Did it involve swelling of the face/tongue/throat, SOB, or low BP? Unknown Did it involve sudden or severe rash/hives, skin peeling, or any reaction on the inside of your mouth or nose? Unknown Did you need to seek medical attention at a hospital or doctor's office? Unknown When did it last happen?Teenager If all above answers are "NO", may proceed with cephalosporin use.   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  Screening Tests Immunization History  Administered Date(s) Administered  . Influenza Split 07/28/2013  . Influenza, High Dose Seasonal PF 09/11/2015, 06/09/2016, 09/15/2018  . Pneumococcal Conjugate-13 11/05/2017  . Pneumococcal-Unspecified 07/01/1999, 09/20/2009  . Td 09/20/2009  . Zoster 11/08/2009   Preventative care: Last colonoscopy: 2015  EGD  2018 PET 11/2018  Prior vaccinations: TD or Tdap: 2010  Influenza: 2019 Pneumococcal: 2010 Prevnar13: 2019 Shingles/Zostavax: 2011  Names of Other Physician/Practitioners you currently use: 1. [facility] here for primary care 2. Dr. Delman Cheadle, eye doctor, last visit 2017 3. Dr. Wynetta Emery , dentist, last visit 2017  Patient Care Team: Unk Pinto, MD as PCP - General (Internal Medicine) Inda Castle, MD (Inactive) as Consulting Physician (Gastroenterology) Melissa Noon, [place] as Referring Physician (Optometry) Danis, Kirke Corin, MD as Consulting Physician (Gastroenterology) Brunetta Genera, MD as Consulting Physician  (Hematology)  Surgical: He  has a past surgical history that includes No past surgeries. Family His family history includes Cirrhosis in his brother; Colon cancer in his paternal grandfather; Diabetes in his brother; Early death in his father; Stroke in his mother. Social history  He reports that he quit smoking about 16 years ago. His smoking use included cigarettes. He has never used smokeless tobacco. He reports current alcohol use. He reports that he does not use drugs.  MEDICARE WELLNESS OBJECTIVES: Physical activity: Current Exercise Habits: Home exercise routine, Intensity: Mild  He will mow the lawn, weed eat.   Cardiac risk factors: Cardiac Risk Factors include: advanced age (>40men, >80 women);dyslipidemia;hypertension;male gender Depression/mood screen:   Depression screen [facility] 2/9 01/21/2019  Decreased Interest 0  Down, Depressed, Hopeless 0  PHQ - 2 Score 0    ADLs:  In your present state of health, do you have any difficulty performing the following activities: 01/21/2019 06/09/2018  Hearing? N N  Vision? N N  Difficulty concentrating or making decisions? N N  Walking or climbing stairs? N N  Dressing or bathing? N N  Doing errands, shopping? N N  Some recent data might be hidden     Cognitive Testing  Alert? Yes  Normal Appearance?Yes  Oriented to person? Yes  Place? Yes   Time? Yes  Recall of three objects?  Yes  Can perform simple calculations? Yes  Displays appropriate judgment?Yes  Can read the correct time from a watch face?Yes  EOL planning: Does Patient Have a Medical Advance Directive?: Yes Type of Advance Directive: Healthcare Power of Attorney, Living will Does patient want to make changes to medical advance directive?: No - Patient declined   Objective:   Today's Vitals   01/21/19 0843  BP: (!) 144/98  Weight: 180 lb (81.6 kg)  Height: 5\' 11"  (1.803 m)   Body mass index is 25.1 kg/m.  General Appearance:Well sounding, in no apparent  distress.  ENT/Mouth: No hoarseness, No cough for duration of visit.  Respiratory: completing full sentences without distress, without audible wheeze Neuro: Awake and oriented X 3,  Psych:  Insight and Judgment appropriate.   Medicare Attestation I have personally reviewed: The patient's medical and social history Their use of alcohol, tobacco or illicit drugs Their current medications and supplements The patient's functional ability including ADLs,fall risks, home safety risks, cognitive, and hearing and visual impairment Diet and physical activities Evidence for depression or mood disorders  The patient's weight, height, BMI, and visual acuity have been recorded in the chart.  I have made referrals, counseling, and provided education to the patient based on review of the above and I have provided the patient with a written personalized care plan for preventive services.     Vicie Mutters, PA-C   01/21/2019

## 2019-01-21 ENCOUNTER — Encounter: Payer: Self-pay | Admitting: Physician Assistant

## 2019-01-21 ENCOUNTER — Ambulatory Visit: Payer: Medicare HMO | Admitting: Physician Assistant

## 2019-01-21 ENCOUNTER — Other Ambulatory Visit: Payer: Self-pay

## 2019-01-21 VITALS — BP 144/98 | Ht 71.0 in | Wt 180.0 lb

## 2019-01-21 DIAGNOSIS — F419 Anxiety disorder, unspecified: Secondary | ICD-10-CM | POA: Diagnosis not present

## 2019-01-21 DIAGNOSIS — I10 Essential (primary) hypertension: Secondary | ICD-10-CM

## 2019-01-21 DIAGNOSIS — Z0001 Encounter for general adult medical examination with abnormal findings: Secondary | ICD-10-CM | POA: Diagnosis not present

## 2019-01-21 DIAGNOSIS — E782 Mixed hyperlipidemia: Secondary | ICD-10-CM

## 2019-01-21 DIAGNOSIS — J302 Other seasonal allergic rhinitis: Secondary | ICD-10-CM

## 2019-01-21 DIAGNOSIS — K5790 Diverticulosis of intestine, part unspecified, without perforation or abscess without bleeding: Secondary | ICD-10-CM

## 2019-01-21 DIAGNOSIS — K2100 Gastro-esophageal reflux disease with esophagitis, without bleeding: Secondary | ICD-10-CM

## 2019-01-21 DIAGNOSIS — E559 Vitamin D deficiency, unspecified: Secondary | ICD-10-CM

## 2019-01-21 DIAGNOSIS — C9110 Chronic lymphocytic leukemia of B-cell type not having achieved remission: Secondary | ICD-10-CM

## 2019-01-21 DIAGNOSIS — Z79899 Other long term (current) drug therapy: Secondary | ICD-10-CM

## 2019-01-21 DIAGNOSIS — R519 Headache, unspecified: Secondary | ICD-10-CM

## 2019-01-21 DIAGNOSIS — R6889 Other general symptoms and signs: Secondary | ICD-10-CM | POA: Diagnosis not present

## 2019-01-21 DIAGNOSIS — R7309 Other abnormal glucose: Secondary | ICD-10-CM | POA: Diagnosis not present

## 2019-01-21 DIAGNOSIS — R51 Headache: Secondary | ICD-10-CM

## 2019-01-21 DIAGNOSIS — Z0000 Encounter for general adult medical examination without abnormal findings: Secondary | ICD-10-CM

## 2019-01-21 MED ORDER — AMLODIPINE BESYLATE 2.5 MG PO TABS: ORAL_TABLET | ORAL | 2 refills | Status: DC

## 2019-01-21 MED ORDER — TRIAMCINOLONE ACETONIDE 55 MCG/ACT NA AERO: 2.0000 | INHALATION_SPRAY | Freq: Every day | NASAL | 3 refills | Status: DC

## 2019-01-21 MED ORDER — OMEPRAZOLE 40 MG PO CPDR: 40.0000 mg | DELAYED_RELEASE_CAPSULE | Freq: Every day | ORAL | 1 refills | Status: DC

## 2019-01-21 NOTE — Progress Notes (Signed)
HEMATOLOGY/ONCOLOGY CLINIC NOTE  Date of Service: 01/24/19   Patient Care Team: Unk Pinto, MD as PCP - General (Internal Medicine) Inda Castle, MD (Inactive) as Consulting Physician (Gastroenterology) Melissa Noon, [place] as Referring Physician (Optometry) Danis, Kirke Corin, MD as Consulting Physician (Gastroenterology) Brunetta Genera, MD as Consulting Physician (Hematology)  CHIEF COMPLAINTS/PURPOSE OF CONSULTATION:  F/u for CD5 neg CLL   HISTORY OF PRESENTING ILLNESS:   Javier [name] is a wonderful 78 y.o. male who has been referred to [place] by Dr .Unk Pinto, MD for evaluation and management of lymphocytosis concerning for a lymphoproliferative process.  Patient has a h/o HTN, HLD, Prediabetes who notes that he had significant abdominal discomfort with significant dyspeptic symptoms around aug 2017 and had about 20-25lbs weight loss and had an extensive GI workup including CT abd/pelvis 05/25/2016 - WNL, EGD which showed chronic gastritis (helicobacter pylori +ve) and colonscopy with some polyps. Capsule endoscopy was not approved by insurance. Patients symptoms improved after treatment of H.pylori and he notes that he has gained back about 6-7 lbs and is eating better.  Patients labs were noted to show some leucocytosis/Lymphocytosis for which he was referred to [place] for further evaluation.  Patient notes some nightsweats and weight loss (as noted above, possible alternative explanation). No fevers/chills. Has not noted any overt enlarged LN and CT abd in 04/2016 showed no hepatomegaly or splenomegaly and no abd/RP LNadenopathy.  Patient notes no other acute focal symptoms.   INTERVAL HISTORY  Javier [name] is here for management and evaluation of his CLL. The patient's last visit with [place] was on 01/17/19. The pt reports that he is doing well overall.  The pt reports that his night sweats have continued to improve slowly. The pt notes that he continues being  able to do all he wants to do, and denies fatigue interrupting what he wants to do. The pt notes that he has continued staying well hydrated, and has tolerated 233m Venetoclax well. He denies any fevers, chills, or concerns for infections.   Lab results today (01/24/19) of CBC w/diff and CMP is as follows: all values are WNL except for WBC at 57.7k, AST at 13. 01/24/19 Uric acid at 3.6 01/24/19 LDH is pending  On review of systems, pt reports improving night sweats, stable energy levels, and denies fevers, chills, concerns for infections, and any other symptoms.     MEDICAL HISTORY:  Past Medical History:  Diagnosis Date  . Diverticulosis   . Hyperlipidemia   . Hypertension   . Prediabetes   . Vitamin D deficiency   Helicobacter pylori +ve   SURGICAL HISTORY: Past Surgical History:  Procedure Laterality Date  . NO PAST SURGERIES      SOCIAL HISTORY: Social History   Socioeconomic History  . Marital status: Married    Spouse name: Not on file  . Number of children: 5  . Years of education: Not on file  . Highest education level: Not on file  Occupational History  . Occupation: retired  SScientific laboratory technician . Financial resource strain: Not on file  . Food insecurity:    Worry: Not on file    Inability: Not on file  . Transportation needs:    Medical: Not on file    Non-medical: Not on file  Tobacco Use  . Smoking status: Former Smoker    Types: Cigarettes    Last attempt to quit: 01/17/2003    Years since quitting: 16.0  . Smokeless  tobacco: Never Used  Substance and Sexual Activity  . Alcohol use: Yes    Comment: very rare  . Drug use: No  . Sexual activity: Not on file  Lifestyle  . Physical activity:    Days per week: Not on file    Minutes per session: Not on file  . Stress: Not on file  Relationships  . Social connections:    Talks on phone: Not on file    Gets together: Not on file    Attends religious service: Not on file    Active member of club or  organization: Not on file    Attends meetings of clubs or organizations: Not on file    Relationship status: Not on file  . Intimate partner violence:    Fear of current or ex partner: Not on file    Emotionally abused: Not on file    Physically abused: Not on file    Forced sexual activity: Not on file  Other Topics Concern  . Not on file  Social History Narrative  . Not on file  Quit smoking >10 yrs ago, previously 1.5Packs per week. Started smoking in his teens. Retired. Worked as a Chief Financial Officer.   FAMILY HISTORY: Family History  Problem Relation Age of Onset  . Stroke Mother   . Early death Father        Farm/tractor accident  . Diabetes Brother   . Cirrhosis Brother   . Colon cancer Paternal Grandfather     ALLERGIES:  is allergic to penicillins and ppd [tuberculin purified protein derivative].  MEDICATIONS:  Current Outpatient Medications  Medication Sig Dispense Refill  . allopurinol (ZYLOPRIM) 100 MG tablet Take 1 tablet (100 mg total) by mouth 2 (two) times daily. 60 tablet 2  . ALPRAZolam (XANAX) 0.5 MG tablet Take 1/2-1 tablet 2 - 3 x /day ONLY if needed for Anxiety Attack &  limit to 5 days /week to avoid addiction 90 tablet 0  . amLODipine (NORVASC) 2.5 MG tablet One pill at night for blood pressure 90 tablet 2  . aspirin EC 81 MG tablet Take 81 mg by mouth daily.    . Cholecalciferol (VITAMIN D3) 5000 units TABS Take 5,000 Units by mouth daily.    Marland Kitchen losartan-hydrochlorothiazide (HYZAAR) 100-25 MG tablet Take 1 tablet by mouth daily. 30 tablet 0  . omeprazole (PRILOSEC) 40 MG capsule Take 1 capsule (40 mg total) by mouth daily. 90 capsule 1  . pravastatin (PRAVACHOL) 40 MG tablet TAKE 1 TABLET AT BEDTIME  FOR  CHOLESTEROL (Patient taking differently: Take 40 mg by mouth at bedtime. ) 90 tablet 3  . triamcinolone (NASACORT) 55 MCG/ACT AERO nasal inhaler Place 2 sprays into the nose at bedtime. 1 Inhaler 3  . venetoclax 100 MG TABS Take 400 mg by mouth daily.  Take with food and water 120 tablet 2   No current facility-administered medications for this visit.     REVIEW OF SYSTEMS:    A 10+ POINT REVIEW OF SYSTEMS WAS OBTAINED including neurology, dermatology, psychiatry, cardiac, respiratory, lymph, extremities, GI, GU, Musculoskeletal, constitutional, breasts, reproductive, HEENT.  All pertinent positives are noted in the HPI.  All others are negative.   PHYSICAL EXAMINATION: ECOG PERFORMANCE STATUS: 1 - Symptomatic but completely ambulatory  Vitals:   01/24/19 1415  BP: (!) 161/83  Pulse: 67  Resp: 18  Temp: 98 F (36.7 C)   Filed Weights   01/24/19 1415  Weight: 180 lb 14.4 oz (82.1 kg)   .  Body mass index is 25.23 kg/m.  GENERAL:alert, in no acute distress and comfortable SKIN: no acute rashes, no significant lesions EYES: conjunctiva are pink and non-injected, sclera anicteric OROPHARYNX: MMM, no exudates, no oropharyngeal erythema or ulceration NECK: supple, no JVD LYMPH:  no palpable lymphadenopathy in the cervical, axillary or inguinal regions LUNGS: clear to auscultation b/l with normal respiratory effort HEART: regular rate & rhythm ABDOMEN:  normoactive bowel sounds , non tender, not distended. No palpable hepatosplenomegaly.  Extremity: no pedal edema PSYCH: alert & oriented x 3 with fluent speech NEURO: no focal motor/sensory deficits   LABORATORY DATA:  I have reviewed the data as listed  . CBC Latest Ref Rng & Units 01/24/2019 01/17/2019 01/10/2019  WBC 4.0 - 10.5 K/uL 57.7(HH) 52.2(HH) 54.7(HH)  Hemoglobin 13.0 - 17.0 g/dL 13.2 12.5(L) 13.6  Hematocrit 39.0 - 52.0 % 43.4 39.9 42.6  Platelets 150 - 400 K/uL 151 166 158   . CBC    Component Value Date/Time   WBC 57.7 (HH) 01/24/2019 1353   RBC 4.53 01/24/2019 1353   HGB 13.2 01/24/2019 1353   HGB 12.2 (L) 01/03/2019 1509   HGB 14.5 10/21/2016 1324   HCT 43.4 01/24/2019 1353   HCT 43.8 10/21/2016 1324   PLT 151 01/24/2019 1353   PLT 152 01/03/2019  1509   PLT 157 10/21/2016 1324   MCV 95.8 01/24/2019 1353   MCV 90.9 10/21/2016 1324   MCH 29.1 01/24/2019 1353   MCHC 30.4 01/24/2019 1353   RDW 13.9 01/24/2019 1353   RDW 13.3 10/21/2016 1324   LYMPHSABS 53.9 (H) 01/24/2019 1353   LYMPHSABS 17.6 (H) 10/21/2016 1324   MONOABS 0.9 01/24/2019 1353   MONOABS 0.6 10/21/2016 1324   EOSABS 0.0 01/24/2019 1353   EOSABS 0.0 10/21/2016 1324   BASOSABS 0.0 01/24/2019 1353   BASOSABS 0.1 10/21/2016 1324     . CMP Latest Ref Rng & Units 01/24/2019 01/17/2019 01/10/2019  Glucose 70 - 99 mg/dL 81 97 117(H)  BUN 8 - 23 mg/dL _0 Creatinine 0.61 - 1.24 mg/dL 1.19 1.26(H) 1.29(H)  Sodium 135 - 145 mmol/L 142 141 140  Potassium 3.5 - 5.1 mmol/L 3.9 3.8 3.8  Chloride 98 - 111 mmol/L 107 107 107  CO2 22 - 32 mmol/L _1 Calcium 8.9 - 10.3 mg/dL 9.6 9.4 9.9  Total Protein 6.5 - 8.1 g/dL 6.9 6.8 7.3  Total Bilirubin 0.3 - 1.2 mg/dL 0.4 0.6 0.4  Alkaline Phos 38 - 126 U/L 65 61 65  AST 15 - 41 U/L 13(L) 14(L) 16  ALT 0 - 44 U/L _2 . Lab Results  Component Value Date   LDH 159 01/10/2019         RADIOGRAPHIC STUDIES: I have personally reviewed the radiological images as listed and agreed with the findings in the report. No results found.  ASSESSMENT & PLAN:   78 y.o. 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 FISH panel showed p53 (17p13) deletion that would be consistent with this diagnosis . It often represents also be more aggressive form of CLL Differential diagnosis includes - CD5 neg CLL vs splenic lymphoma vs Marginal Zone lymphoma vs LPL (lymphoplasmacytic lymphoma) LDH WNL suggests against a high grade process. Currently no anemia or thrombocytopenia noted. Now with drenching night sweats and fatigue, no fevers/chills.  10/31/16 PET/CT scan shows some small hypermetabolic nodes in the thorax including bilateral hilar  paratracheal) esophageal. These would  be difficult to biopsy.  Lost to follow up between 11/06/16 and 11/19/18  12/01/18 PET/CT revealed Stable scattered small hypermetabolic thoracic lymph nodes, once again at [place] 5 activity level. 2. No findings of involvement in the neck, abdomen/pelvis, or skeleton. 3. Prostatomegaly. 4. Aortic Atherosclerosis and Emphysema 5. Sigmoid colon diverticulosis  PLAN -Discussed pt labwork today, 01/24/19; WBC stable at 57.7k, other blood counts and chemistries are stable. Uric acid at 3.6 -The pt has no prohibitive toxicities from increasing to 436m Venetoclax at this time.  -Discussed that as pt's WBC have remained stable, despite 3 weeks of treatment thus far, I recommend setting pt up to begin C1 Gazyva in the next 1-2 weeks -Continue Allopurinol -Pt has been able to consume at least 48-60 oz of water each day, and stressed that he should continue to do this, and we will hold IV fluids in light of this -Advised raising the head of his bed and not eating food within 2 hours of ideal bed time -Will continue to monitor labs weekly and see pt weekly during first month of treatment. -Discussed that I recommend following up with Dr. MMelford Aasefurther regarding dyspepsia and feeling of impending doom, and rule out cardiologic etiology -Recommend following up with PCP for prostatomegaly management and recommendations. -Will see the pt back in 7-10 days    2) . Patient Active Problem List   Diagnosis Date Noted  . Anxiety 02/08/2018  . CLL (chronic lymphocytic leukemia) ([place] 11/01/2016  . Encounter for Medicare annual wellness exam 09/11/2015  . GERD  02/09/2015  . Medication management 11/02/2013  . Hyperlipidemia   . Hypertension   . Abnormal glucose   . Vitamin D deficiency   . Diverticulosis    -f/u with PCP for management of other medical co-morbids  3) H.pylori gastritis -f/u with GI to ensure eradication of h.pylori post treatment.   RTC with Dr KIrene Limbowith labs in 7-9 days and 1st  dose of Gazyva. Plz schedule to start Gazyva in 7-9 days with clinic visit and labs   All of the patients questions were answered with apparent satisfaction. The patient knows to call the clinic with any problems, questions or concerns.  The total time spent in the appt was 20 minutes and more than 50% was on counseling and direct patient cares.    GSullivan LoneMD MS AAHIVMS SCarolina [facility] LLCCGreater Binghamton Health CenterHematology/Oncology Physician CSelect Specialty [facility] - [facility] (Office):       [phone](Work cell):  [phone](Fax):           [phone] 01/24/2019 2:46 PM  I, SBaldwin Jamaica am acting as a scribe for Dr. GSullivan Lone   .I have reviewed the above documentation for accuracy and completeness, and I agree with the above. .Brunetta GeneraMD

## 2019-01-24 ENCOUNTER — Inpatient Hospital Stay: Payer: Medicare HMO

## 2019-01-24 ENCOUNTER — Telehealth: Payer: Self-pay | Admitting: Hematology

## 2019-01-24 ENCOUNTER — Other Ambulatory Visit: Payer: Self-pay

## 2019-01-24 ENCOUNTER — Inpatient Hospital Stay (HOSPITAL_BASED_OUTPATIENT_CLINIC_OR_DEPARTMENT_OTHER): Payer: Medicare HMO | Admitting: Hematology

## 2019-01-24 VITALS — BP 161/83 | HR 67 | Temp 98.0°F | Resp 18 | Ht 71.0 in | Wt 180.9 lb

## 2019-01-24 DIAGNOSIS — K5730 Diverticulosis of large intestine without perforation or abscess without bleeding: Secondary | ICD-10-CM | POA: Diagnosis not present

## 2019-01-24 DIAGNOSIS — R5383 Other fatigue: Secondary | ICD-10-CM | POA: Diagnosis not present

## 2019-01-24 DIAGNOSIS — R7303 Prediabetes: Secondary | ICD-10-CM | POA: Diagnosis not present

## 2019-01-24 DIAGNOSIS — Z9189 Other specified personal risk factors, not elsewhere classified: Secondary | ICD-10-CM

## 2019-01-24 DIAGNOSIS — C9110 Chronic lymphocytic leukemia of B-cell type not having achieved remission: Secondary | ICD-10-CM

## 2019-01-24 DIAGNOSIS — Z79899 Other long term (current) drug therapy: Secondary | ICD-10-CM

## 2019-01-24 DIAGNOSIS — R61 Generalized hyperhidrosis: Secondary | ICD-10-CM

## 2019-01-24 DIAGNOSIS — N400 Enlarged prostate without lower urinary tract symptoms: Secondary | ICD-10-CM | POA: Diagnosis not present

## 2019-01-24 DIAGNOSIS — E559 Vitamin D deficiency, unspecified: Secondary | ICD-10-CM | POA: Diagnosis not present

## 2019-01-24 DIAGNOSIS — E785 Hyperlipidemia, unspecified: Secondary | ICD-10-CM | POA: Diagnosis not present

## 2019-01-24 DIAGNOSIS — Z7189 Other specified counseling: Secondary | ICD-10-CM

## 2019-01-24 DIAGNOSIS — I10 Essential (primary) hypertension: Secondary | ICD-10-CM | POA: Diagnosis not present

## 2019-01-24 LAB — CBC WITH DIFFERENTIAL/PLATELET
Abs Immature Granulocytes: 0.07 10*3/uL (ref 0.00–0.07)
Basophils Absolute: 0 10*3/uL (ref 0.0–0.1)
Basophils Relative: 0 %
Eosinophils Absolute: 0 10*3/uL (ref 0.0–0.5)
Eosinophils Relative: 0 %
HCT: 43.4 % (ref 39.0–52.0)
Hemoglobin: 13.2 g/dL (ref 13.0–17.0)
Immature Granulocytes: 0 %
Lymphocytes Relative: 93 %
Lymphs Abs: 53.9 10*3/uL — ABNORMAL HIGH (ref 0.7–4.0)
MCH: 29.1 pg (ref 26.0–34.0)
MCHC: 30.4 g/dL (ref 30.0–36.0)
MCV: 95.8 fL (ref 80.0–100.0)
Monocytes Absolute: 0.9 10*3/uL (ref 0.1–1.0)
Monocytes Relative: 2 %
Neutro Abs: 2.9 10*3/uL (ref 1.7–7.7)
Neutrophils Relative %: 5 %
Platelets: 151 10*3/uL (ref 150–400)
RBC: 4.53 MIL/uL (ref 4.22–5.81)
RDW: 13.9 % (ref 11.5–15.5)
WBC: 57.7 10*3/uL (ref 4.0–10.5)
nRBC: 0 % (ref 0.0–0.2)

## 2019-01-24 LAB — COMPREHENSIVE METABOLIC PANEL
ALT: 11 U/L (ref 0–44)
AST: 13 U/L — ABNORMAL LOW (ref 15–41)
Albumin: 3.9 g/dL (ref 3.5–5.0)
Alkaline Phosphatase: 65 U/L (ref 38–126)
Anion gap: 12 (ref 5–15)
BUN: 13 mg/dL (ref 8–23)
CO2: 23 mmol/L (ref 22–32)
Calcium: 9.6 mg/dL (ref 8.9–10.3)
Chloride: 107 mmol/L (ref 98–111)
Creatinine, Ser: 1.19 mg/dL (ref 0.61–1.24)
GFR calc Af Amer: >60 mL/min (ref >60)
GFR calc non Af Amer: 59 mL/min — ABNORMAL LOW (ref >60)
Glucose, Bld: 81 mg/dL (ref 70–99)
Potassium: 3.9 mmol/L (ref 3.5–5.1)
Sodium: 142 mmol/L (ref 135–145)
Total Bilirubin: 0.4 mg/dL (ref 0.3–1.2)
Total Protein: 6.9 g/dL (ref 6.5–8.1)

## 2019-01-24 LAB — PHOSPHORUS: Phosphorus: 2.6 mg/dL (ref 2.5–4.6)

## 2019-01-24 LAB — URIC ACID: Uric Acid, Serum: 3.6 mg/dL — ABNORMAL LOW (ref 3.7–8.6)

## 2019-01-24 LAB — LACTATE DEHYDROGENASE: LDH: 162 U/L (ref 98–192)

## 2019-01-24 NOTE — Progress Notes (Signed)
START ON PATHWAY REGIMEN - Lymphoma and CLL     Cycle 1: A cycle is 28 days:     Venetoclax      Obinutuzumab      Obinutuzumab      Obinutuzumab    Cycle 2: A cycle is 28 days:     Venetoclax      Venetoclax      Venetoclax      Venetoclax      Obinutuzumab    Cycles 3 through 6: A cycle is 28 days:     Venetoclax      Obinutuzumab    Cycles 7 through 12: A cycle is 28 days:     Venetoclax   **Always confirm dose/schedule in your pharmacy ordering system**  Patient Characteristics: Chronic Lymphocytic Leukemia (CLL), First Line, Treatment Indicated, 17p del (+) or ATM Mutation Positive Disease Type: Chronic Lymphocytic Leukemia (CLL) Disease Type: Not Applicable Disease Type: Not Applicable Line of Therapy: First Line RAI Stage: I Treatment Indicated<= Treatment Indicated ATM Mutation Status: Negative 17p Deletion Status: Positive Intent of Therapy: Non-Curative / Palliative Intent, Discussed with Patient

## 2019-01-24 NOTE — Telephone Encounter (Signed)
Scheduled appt per 4/27 los.  Patient aware of scheduled appt date and time,

## 2019-02-02 NOTE — Progress Notes (Signed)
HEMATOLOGY/ONCOLOGY CLINIC NOTE  Date of Service: 02/03/19   Patient Care Team: Unk Pinto, MD as PCP - General (Internal Medicine) Inda Castle, MD (Inactive) as Consulting Physician (Gastroenterology) Melissa Noon, [place] as Referring Physician (Optometry) Danis, Kirke Corin, MD as Consulting Physician (Gastroenterology) Brunetta Genera, MD as Consulting Physician (Hematology)  CHIEF COMPLAINTS/PURPOSE OF CONSULTATION:  F/u for CD5 neg CLL   HISTORY OF PRESENTING ILLNESS:   Javier [name] is a wonderful 78 y.o. male who has been referred to [place] by Dr .Unk Pinto, MD for evaluation and management of lymphocytosis concerning for a lymphoproliferative process.  Patient has a h/o HTN, HLD, Prediabetes who notes that he had significant abdominal discomfort with significant dyspeptic symptoms around aug 2017 and had about 20-25lbs weight loss and had an extensive GI workup including CT abd/pelvis 05/25/2016 - WNL, EGD which showed chronic gastritis (helicobacter pylori +ve) and colonscopy with some polyps. Capsule endoscopy was not approved by insurance. Patients symptoms improved after treatment of H.pylori and he notes that he has gained back about 6-7 lbs and is eating better.  Patients labs were noted to show some leucocytosis/Lymphocytosis for which he was referred to [place] for further evaluation.  Patient notes some nightsweats and weight loss (as noted above, possible alternative explanation). No fevers/chills. Has not noted any overt enlarged LN and CT abd in 04/2016 showed no hepatomegaly or splenomegaly and no abd/RP LNadenopathy.  Patient notes no other acute focal symptoms.   INTERVAL HISTORY  Javier [name] is here for management and evaluation of his CLL. The patient's last visit with [place] was on 01/24/19. The pt reports that he is doing well overall.  The pt reports that he has been tolerating 439m Venetoclax well, since this was increased and he began taking  this 4 days ago. He endorses good energy levels. He notes that he has not had much nausea. He notes that his night sweats "don't last as long as they used to," but still present a couple times a week. He denies diarrhea, constipation, or skin rashes.  Lab results today (02/03/19) of CBC w/diff is as follows: all values are WNL except for WBC at 15.9k, PLT at 144k, Lymphs abs at 13.2k.  On review of systems, pt reports good energy levels, eating well, stable weight, and denies diarrhea, constipation, skin rashes, abdominal pains, nausea, leg swelling, and any other symptoms.   MEDICAL HISTORY:  Past Medical History:  Diagnosis Date  . Diverticulosis   . Hyperlipidemia   . Hypertension   . Prediabetes   . Vitamin D deficiency   Helicobacter pylori +ve   SURGICAL HISTORY: Past Surgical History:  Procedure Laterality Date  . NO PAST SURGERIES      SOCIAL HISTORY: Social History   Socioeconomic History  . Marital status: Married    Spouse name: Not on file  . Number of children: 5  . Years of education: Not on file  . Highest education level: Not on file  Occupational History  . Occupation: retired  SScientific laboratory technician . Financial resource strain: Not on file  . Food insecurity:    Worry: Not on file    Inability: Not on file  . Transportation needs:    Medical: Not on file    Non-medical: Not on file  Tobacco Use  . Smoking status: Former Smoker    Types: Cigarettes    Last attempt to quit: 01/17/2003    Years since quitting: 16.0  . Smokeless  tobacco: Never Used  Substance and Sexual Activity  . Alcohol use: Yes    Comment: very rare  . Drug use: No  . Sexual activity: Not on file  Lifestyle  . Physical activity:    Days per week: Not on file    Minutes per session: Not on file  . Stress: Not on file  Relationships  . Social connections:    Talks on phone: Not on file    Gets together: Not on file    Attends religious service: Not on file    Active member of club or  organization: Not on file    Attends meetings of clubs or organizations: Not on file    Relationship status: Not on file  . Intimate partner violence:    Fear of current or ex partner: Not on file    Emotionally abused: Not on file    Physically abused: Not on file    Forced sexual activity: Not on file  Other Topics Concern  . Not on file  Social History Narrative  . Not on file  Quit smoking >10 yrs ago, previously 1.5Packs per week. Started smoking in his teens. Retired. Worked as a Chief Financial Officer.   FAMILY HISTORY: Family History  Problem Relation Age of Onset  . Stroke Mother   . Early death Father        Farm/tractor accident  . Diabetes Brother   . Cirrhosis Brother   . Colon cancer Paternal Grandfather     ALLERGIES:  is allergic to penicillins and ppd [tuberculin purified protein derivative].  MEDICATIONS:  Current Outpatient Medications  Medication Sig Dispense Refill  . allopurinol (ZYLOPRIM) 100 MG tablet Take 1 tablet (100 mg total) by mouth 2 (two) times daily. 60 tablet 2  . ALPRAZolam (XANAX) 0.5 MG tablet Take 1/2-1 tablet 2 - 3 x /day ONLY if needed for Anxiety Attack &  limit to 5 days /week to avoid addiction 90 tablet 0  . amLODipine (NORVASC) 2.5 MG tablet One pill at night for blood pressure 90 tablet 2  . aspirin EC 81 MG tablet Take 81 mg by mouth daily.    . Cholecalciferol (VITAMIN D3) 5000 units TABS Take 5,000 Units by mouth daily.    Marland Kitchen losartan-hydrochlorothiazide (HYZAAR) 100-25 MG tablet Take 1 tablet by mouth daily. 30 tablet 0  . omeprazole (PRILOSEC) 40 MG capsule Take 1 capsule (40 mg total) by mouth daily. 90 capsule 1  . pravastatin (PRAVACHOL) 40 MG tablet TAKE 1 TABLET AT BEDTIME  FOR  CHOLESTEROL (Patient taking differently: Take 40 mg by mouth at bedtime. ) 90 tablet 3  . triamcinolone (NASACORT) 55 MCG/ACT AERO nasal inhaler Place 2 sprays into the nose at bedtime. 1 Inhaler 3  . venetoclax 100 MG TABS Take 400 mg by mouth daily.  Take with food and water 120 tablet 2   No current facility-administered medications for this visit.     REVIEW OF SYSTEMS:    A 10+ POINT REVIEW OF SYSTEMS WAS OBTAINED including neurology, dermatology, psychiatry, cardiac, respiratory, lymph, extremities, GI, GU, Musculoskeletal, constitutional, breasts, reproductive, HEENT.  All pertinent positives are noted in the HPI.  All others are negative.   PHYSICAL EXAMINATION: ECOG PERFORMANCE STATUS: 1 - Symptomatic but completely ambulatory  Vitals:   02/03/19 0921  BP: (!) 143/76  Pulse: 66  Resp: 18  Temp: 98.1 F (36.7 C)  SpO2: 100%   Filed Weights   02/03/19 0921  Weight: 181 lb (82.1 kg)   .  Body mass index is 25.24 kg/m.  GENERAL:alert, in no acute distress and comfortable SKIN: no acute rashes, no significant lesions EYES: conjunctiva are pink and non-injected, sclera anicteric OROPHARYNX: MMM, no exudates, no oropharyngeal erythema or ulceration NECK: supple, no JVD LYMPH:  no palpable lymphadenopathy in the cervical, axillary or inguinal regions LUNGS: clear to auscultation b/l with normal respiratory effort HEART: regular rate & rhythm ABDOMEN:  normoactive bowel sounds , non tender, not distended. No palpable hepatosplenomegaly.  Extremity: no pedal edema PSYCH: alert & oriented x 3 with fluent speech NEURO: no focal motor/sensory deficits   LABORATORY DATA:  I have reviewed the data as listed  . CBC Latest Ref Rng & Units 02/03/2019 01/24/2019 01/17/2019  WBC 4.0 - 10.5 K/uL 15.9(H) 57.7(HH) 52.2(HH)  Hemoglobin 13.0 - 17.0 g/dL 13.4 13.2 12.5(L)  Hematocrit 39.0 - 52.0 % 42.4 43.4 39.9  Platelets 150 - 400 K/uL 144(L) 151 166   . CBC    Component Value Date/Time   WBC 15.9 (H) 02/03/2019 0859   RBC 4.50 02/03/2019 0859   HGB 13.4 02/03/2019 0859   HGB 12.2 (L) 01/03/2019 1509   HGB 14.5 10/21/2016 1324   HCT 42.4 02/03/2019 0859   HCT 43.8 10/21/2016 1324   PLT 144 (L) 02/03/2019 0859   PLT 152  01/03/2019 1509   PLT 157 10/21/2016 1324   MCV 94.2 02/03/2019 0859   MCV 90.9 10/21/2016 1324   MCH 29.8 02/03/2019 0859   MCHC 31.6 02/03/2019 0859   RDW 13.9 02/03/2019 0859   RDW 13.3 10/21/2016 1324   LYMPHSABS 13.2 (H) 02/03/2019 0859   LYMPHSABS 17.6 (H) 10/21/2016 1324   MONOABS 0.4 02/03/2019 0859   MONOABS 0.6 10/21/2016 1324   EOSABS 0.0 02/03/2019 0859   EOSABS 0.0 10/21/2016 1324   BASOSABS 0.0 02/03/2019 0859   BASOSABS 0.1 10/21/2016 1324     . CMP Latest Ref Rng & Units 01/24/2019 01/17/2019 01/10/2019  Glucose 70 - 99 mg/dL 81 97 117(H)  BUN 8 - 23 mg/dL _0 Creatinine 0.61 - 1.24 mg/dL 1.19 1.26(H) 1.29(H)  Sodium 135 - 145 mmol/L 142 141 140  Potassium 3.5 - 5.1 mmol/L 3.9 3.8 3.8  Chloride 98 - 111 mmol/L 107 107 107  CO2 22 - 32 mmol/L _1 Calcium 8.9 - 10.3 mg/dL 9.6 9.4 9.9  Total Protein 6.5 - 8.1 g/dL 6.9 6.8 7.3  Total Bilirubin 0.3 - 1.2 mg/dL 0.4 0.6 0.4  Alkaline Phos 38 - 126 U/L 65 61 65  AST 15 - 41 U/L 13(L) 14(L) 16  ALT 0 - 44 U/L _2 . Lab Results  Component Value Date   LDH 162 01/24/2019         RADIOGRAPHIC STUDIES: I have personally reviewed the radiological images as listed and agreed with the findings in the report. No results found.  ASSESSMENT & PLAN:   78 y.o. 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 FISH panel showed p53 (17p13) deletion that would be consistent with this diagnosis . It often represents also be more aggressive form of CLL Differential diagnosis includes - CD5 neg CLL vs splenic lymphoma vs Marginal Zone lymphoma vs LPL (lymphoplasmacytic lymphoma) LDH WNL suggests against a high grade process. Currently no anemia or thrombocytopenia noted. Now with drenching night sweats and fatigue, no fevers/chills.  10/31/16 PET/CT scan shows some small hypermetabolic nodes in the thorax including bilateral  hilar paratracheal) esophageal.  These would be difficult to biopsy.  Lost to follow up between 11/06/16 and 11/19/18  12/01/18 PET/CT revealed Stable scattered small hypermetabolic thoracic lymph nodes, once again at [place] 5 activity level. 2. No findings of involvement in the neck, abdomen/pelvis, or skeleton. 3. Prostatomegaly. 4. Aortic Atherosclerosis and Emphysema 5. Sigmoid colon diverticulosis  PLAN -Discussed pt labwork today, 02/03/19; WBC improved from 57.7k ten days ago, to 15.9k today. Lymphs abs now at 13.2k. HGB normal at 13.4, PLT okay at 144k. -The pt has no prohibitive toxicities from continuing 484m Venetoclax at this time. -Will begin C1 Gazyva today. First month with 4 infusions on D1, D2, D8 and D15, followed by once a month. -Discussed this plan with the pt's wife over the phone per pt preference  -Continue 1065mAllopurinol BID -Pt has been able to consume at least 48-60 oz of water each day, and stressed that he should continue to do this, and we will hold IV fluids in light of this -Advised raising the head of his bed and not eating food within 2 hours of ideal bed time -Will continue to monitor labs weekly and see pt weekly during first month of treatment. -Discussed that I recommend following up with Dr. McMelford Aaseurther regarding dyspepsia and feeling of impending doom, and rule out cardiologic etiology -Recommend following up with PCP for prostatomegaly management and recommendations. -Will see the pt back in 1 week   2) . Patient Active Problem List   Diagnosis Date Noted  . Counseling regarding advance care planning and goals of care 01/24/2019  . Anxiety 02/08/2018  . CLL (chronic lymphocytic leukemia) ([place]/11/2016  . Encounter for Medicare annual wellness exam 09/11/2015  . GERD  02/09/2015  . Medication management 11/02/2013  . Hyperlipidemia   . Hypertension   . Abnormal glucose   . Vitamin D deficiency   . Diverticulosis    -f/u with PCP for management of other medical  co-morbids  3) H.pylori gastritis -f/u with GI to ensure eradication of h.pylori post treatment.   Today is C1D1 of Gazyva. Please schedule C1D2, C1D8, C1D15 and C2D1 Labs with each Gazyva infusion. RTC with Dr KaIrene Limbon C1D8 for toxicity check and then C2D1.   All of the patients questions were answered with apparent satisfaction. The patient knows to call the clinic with any problems, questions or concerns.  The total time spent in the appt was 25 minutes and more than 50% was on counseling and direct patient cares.    GaSullivan LoneD MS AAHIVMS SCLaser [facility] Surgery [facility] Of AcadianaTSt. [facility] Regional Medical Centerematology/Oncology Physician CoTracy Surgery Center(Office):       [phone] cell):  [phone]):           [phone]/03/2019 9:49 AM  I, ScBaldwin Jamaicaam acting as a scribe for Dr. GaSullivan Lone  .I have reviewed the above documentation for accuracy and completeness, and I agree with the above. .GBrunetta GeneraD

## 2019-02-03 ENCOUNTER — Inpatient Hospital Stay: Payer: Medicare HMO | Attending: Hematology

## 2019-02-03 ENCOUNTER — Inpatient Hospital Stay (HOSPITAL_BASED_OUTPATIENT_CLINIC_OR_DEPARTMENT_OTHER): Payer: Medicare HMO | Admitting: Hematology

## 2019-02-03 ENCOUNTER — Inpatient Hospital Stay: Payer: Medicare HMO

## 2019-02-03 ENCOUNTER — Telehealth: Payer: Self-pay | Admitting: Hematology

## 2019-02-03 ENCOUNTER — Telehealth: Payer: Self-pay | Admitting: *Deleted

## 2019-02-03 ENCOUNTER — Other Ambulatory Visit: Payer: Self-pay

## 2019-02-03 ENCOUNTER — Telehealth: Payer: Self-pay | Admitting: Pharmacist

## 2019-02-03 VITALS — BP 167/83 | HR 89 | Resp 16

## 2019-02-03 VITALS — BP 143/76 | HR 66 | Temp 98.1°F | Resp 18 | Ht 71.0 in | Wt 181.0 lb

## 2019-02-03 DIAGNOSIS — C9110 Chronic lymphocytic leukemia of B-cell type not having achieved remission: Secondary | ICD-10-CM

## 2019-02-03 DIAGNOSIS — Z7189 Other specified counseling: Secondary | ICD-10-CM

## 2019-02-03 DIAGNOSIS — Z5111 Encounter for antineoplastic chemotherapy: Secondary | ICD-10-CM | POA: Diagnosis not present

## 2019-02-03 DIAGNOSIS — Z79899 Other long term (current) drug therapy: Secondary | ICD-10-CM | POA: Insufficient documentation

## 2019-02-03 DIAGNOSIS — J439 Emphysema, unspecified: Secondary | ICD-10-CM | POA: Insufficient documentation

## 2019-02-03 DIAGNOSIS — Z87891 Personal history of nicotine dependence: Secondary | ICD-10-CM | POA: Diagnosis not present

## 2019-02-03 DIAGNOSIS — N400 Enlarged prostate without lower urinary tract symptoms: Secondary | ICD-10-CM | POA: Insufficient documentation

## 2019-02-03 DIAGNOSIS — K219 Gastro-esophageal reflux disease without esophagitis: Secondary | ICD-10-CM | POA: Diagnosis not present

## 2019-02-03 DIAGNOSIS — E559 Vitamin D deficiency, unspecified: Secondary | ICD-10-CM | POA: Insufficient documentation

## 2019-02-03 DIAGNOSIS — K5730 Diverticulosis of large intestine without perforation or abscess without bleeding: Secondary | ICD-10-CM | POA: Insufficient documentation

## 2019-02-03 DIAGNOSIS — I700 Atherosclerosis of aorta: Secondary | ICD-10-CM | POA: Diagnosis not present

## 2019-02-03 DIAGNOSIS — R61 Generalized hyperhidrosis: Secondary | ICD-10-CM | POA: Diagnosis not present

## 2019-02-03 DIAGNOSIS — Z7982 Long term (current) use of aspirin: Secondary | ICD-10-CM | POA: Diagnosis not present

## 2019-02-03 DIAGNOSIS — E785 Hyperlipidemia, unspecified: Secondary | ICD-10-CM | POA: Insufficient documentation

## 2019-02-03 DIAGNOSIS — R7303 Prediabetes: Secondary | ICD-10-CM | POA: Diagnosis not present

## 2019-02-03 DIAGNOSIS — I10 Essential (primary) hypertension: Secondary | ICD-10-CM | POA: Diagnosis not present

## 2019-02-03 DIAGNOSIS — Z9189 Other specified personal risk factors, not elsewhere classified: Secondary | ICD-10-CM

## 2019-02-03 LAB — CMP (CANCER CENTER ONLY)
ALT: 13 U/L (ref 0–44)
AST: 13 U/L — ABNORMAL LOW (ref 15–41)
Albumin: 4 g/dL (ref 3.5–5.0)
Alkaline Phosphatase: 62 U/L (ref 38–126)
Anion gap: 9 (ref 5–15)
BUN: 14 mg/dL (ref 8–23)
CO2: 23 mmol/L (ref 22–32)
Calcium: 9.5 mg/dL (ref 8.9–10.3)
Chloride: 108 mmol/L (ref 98–111)
Creatinine: 1.19 mg/dL (ref 0.61–1.24)
GFR, Est AFR Am: >60 mL/min (ref >60)
GFR, Estimated: 58 mL/min — ABNORMAL LOW (ref >60)
Glucose, Bld: 96 mg/dL (ref 70–99)
Potassium: 3.6 mmol/L (ref 3.5–5.1)
Sodium: 140 mmol/L (ref 135–145)
Total Bilirubin: 0.5 mg/dL (ref 0.3–1.2)
Total Protein: 7.2 g/dL (ref 6.5–8.1)

## 2019-02-03 LAB — CBC WITH DIFFERENTIAL/PLATELET
Abs Immature Granulocytes: 0.01 10*3/uL (ref 0.00–0.07)
Basophils Absolute: 0 10*3/uL (ref 0.0–0.1)
Basophils Relative: 0 %
Eosinophils Absolute: 0 10*3/uL (ref 0.0–0.5)
Eosinophils Relative: 0 %
HCT: 42.4 % (ref 39.0–52.0)
Hemoglobin: 13.4 g/dL (ref 13.0–17.0)
Immature Granulocytes: 0 %
Lymphocytes Relative: 83 %
Lymphs Abs: 13.2 10*3/uL — ABNORMAL HIGH (ref 0.7–4.0)
MCH: 29.8 pg (ref 26.0–34.0)
MCHC: 31.6 g/dL (ref 30.0–36.0)
MCV: 94.2 fL (ref 80.0–100.0)
Monocytes Absolute: 0.4 10*3/uL (ref 0.1–1.0)
Monocytes Relative: 3 %
Neutro Abs: 2.3 10*3/uL (ref 1.7–7.7)
Neutrophils Relative %: 14 %
Platelets: 144 10*3/uL — ABNORMAL LOW (ref 150–400)
RBC: 4.5 MIL/uL (ref 4.22–5.81)
RDW: 13.9 % (ref 11.5–15.5)
WBC: 15.9 10*3/uL — ABNORMAL HIGH (ref 4.0–10.5)
nRBC: 0 % (ref 0.0–0.2)

## 2019-02-03 LAB — URIC ACID: Uric Acid, Serum: 4.1 mg/dL (ref 3.7–8.6)

## 2019-02-03 LAB — LACTATE DEHYDROGENASE: LDH: 172 U/L (ref 98–192)

## 2019-02-03 MED ORDER — SODIUM CHLORIDE 0.9 % IV SOLN
20.0000 mg | Freq: Once | INTRAVENOUS | Status: AC
  Administered 2019-02-03: 11:00:00 20 mg via INTRAVENOUS
  Filled 2019-02-03: qty 20

## 2019-02-03 MED ORDER — DIPHENHYDRAMINE HCL 50 MG/ML IJ SOLN
50.0000 mg | Freq: Once | INTRAMUSCULAR | Status: AC
  Administered 2019-02-03: 11:00:00 50 mg via INTRAVENOUS

## 2019-02-03 MED ORDER — SODIUM CHLORIDE 0.9 % IV SOLN
100.0000 mg | Freq: Once | INTRAVENOUS | Status: AC
  Administered 2019-02-03: 12:00:00 100 mg via INTRAVENOUS
  Filled 2019-02-03: qty 4

## 2019-02-03 MED ORDER — FAMOTIDINE IN NACL 20-0.9 MG/50ML-% IV SOLN
20.0000 mg | Freq: Once | INTRAVENOUS | Status: AC
  Administered 2019-02-03: 11:00:00 20 mg via INTRAVENOUS

## 2019-02-03 MED ORDER — ACETAMINOPHEN 325 MG PO TABS
ORAL_TABLET | ORAL | Status: AC
  Filled 2019-02-03: qty 2

## 2019-02-03 MED ORDER — ACETAMINOPHEN 325 MG PO TABS
650.0000 mg | ORAL_TABLET | Freq: Once | ORAL | Status: AC
  Administered 2019-02-03: 11:00:00 650 mg via ORAL

## 2019-02-03 MED ORDER — FAMOTIDINE IN NACL 20-0.9 MG/50ML-% IV SOLN
INTRAVENOUS | Status: AC
  Filled 2019-02-03: qty 50

## 2019-02-03 MED ORDER — SODIUM CHLORIDE 0.9 % IV SOLN
Freq: Once | INTRAVENOUS | Status: AC
  Administered 2019-02-03: 11:00:00 via INTRAVENOUS
  Filled 2019-02-03: qty 250

## 2019-02-03 MED ORDER — DIPHENHYDRAMINE HCL 50 MG/ML IJ SOLN
INTRAMUSCULAR | Status: AC
  Filled 2019-02-03: qty 1

## 2019-02-03 NOTE — Telephone Encounter (Signed)
Scheduled appt per 5/7 los.  Added two treatments in the book for approval. ( 5/8 & 5/21)

## 2019-02-03 NOTE — Telephone Encounter (Signed)
Pt was not scheduled for pt education.  Talked with pt & he was unsure of education & had been given benadryl so wife was called & discussed gazyva with her.  Education packet left with pt to share with his wife.

## 2019-02-03 NOTE — Patient Instructions (Addendum)
[facility] Discharge Instructions for Patients Receiving Chemotherapy  Today you received the following chemotherapy agents:  obinutuzumab Dyann Kief)  To help prevent nausea and vomiting after your treatment, we encourage you to take your nausea medication as prescribed.   If you develop nausea and vomiting that is not controlled by your nausea medication, call the clinic.   BELOW ARE SYMPTOMS THAT SHOULD BE REPORTED IMMEDIATELY:  *FEVER GREATER THAN 100.5 F  *CHILLS WITH OR WITHOUT FEVER  NAUSEA AND VOMITING THAT IS NOT CONTROLLED WITH YOUR NAUSEA MEDICATION  *UNUSUAL SHORTNESS OF BREATH  *UNUSUAL BRUISING OR BLEEDING  TENDERNESS IN MOUTH AND THROAT WITH OR WITHOUT PRESENCE OF ULCERS  *URINARY PROBLEMS  *BOWEL PROBLEMS  UNUSUAL RASH Items with * indicate a potential emergency and should be followed up as soon as possible.  Feel free to call the clinic should you have any questions or concerns. The clinic phone number is [phone].  Please show the [place] at check-in to the Emergency Department and triage nurse.   COVID-19 Labs  No results for input(s): DDIMER, FERRITIN, LDH, CRP in the last 72 hours.  No results found for: SARSCOV2NAA   Obinutuzumab injection What is this medicine? OBINUTUZUMAB (OH bi nue TOOZ ue mab) is a monoclonal antibody. It is used to treat chronic lymphocytic leukemia (CLL) and a type of non-Hodgkin lymphoma (NHL), follicular lymphoma. This medicine may be used for other purposes; ask your health care provider or pharmacist if you have questions. COMMON BRAND NAME(S): GAZYVA What should I tell my health care provider before I take this medicine? They need to know if you have any of these conditions: -infection (especially a virus infection such as hepatitis B virus) -lung or breathing disease -heart disease -take medicines that treat or prevent blood clots -an unusual or allergic reaction to obinutuzumab, other  medicines, foods, dyes, or preservatives -pregnant or trying to get pregnant -breast-feeding How should I use this medicine? This medicine is for infusion into a vein. It is given by a health care professional in a hospital or clinic setting. Talk to your pediatrician regarding the use of this medicine in children. Special care may be needed. Overdosage: If you think you have taken too much of this medicine contact a poison control center or emergency room at once. NOTE: This medicine is only for you. Do not share this medicine with others. What if I miss a dose? Keep appointments for follow-up doses as directed. It is important not to miss your dose. Call your doctor or health care professional if you are unable to keep an appointment. What may interact with this medicine? -live virus vaccines This list may not describe all possible interactions. Give your health care provider a list of all the medicines, herbs, non-prescription drugs, or dietary supplements you use. Also tell them if you smoke, drink alcohol, or use illegal drugs. Some items may interact with your medicine. What should I watch for while using this medicine? Report any side effects that you notice during your treatment right away, such as changes in your breathing, fever, chills, dizziness or lightheadedness. These effects are more common with the first dose. Visit your prescriber or health care professional for checks on your progress. You will need to have regular blood work. Report any other side effects. The side effects of this medicine can continue after you finish your treatment. Continue your course of treatment even though you feel ill unless your doctor tells you to stop. Call  your doctor or health care professional for advice if you get a fever, chills or sore throat, or other symptoms of a cold or flu. Do not treat yourself. This drug decreases your body's ability to fight infections. Try to avoid being around people who  are sick. This medicine may increase your risk to bruise or bleed. Call your doctor or health care professional if you notice any unusual bleeding. What side effects may I notice from receiving this medicine? Side effects that you should report to your doctor or health care professional as soon as possible: -allergic reactions like skin rash, itching or hives, swelling of the face, lips, or tongue -breathing problems -changes in vision -chest pain or chest tightness -confusion -dizziness -loss of balance or coordination -low blood counts - this medicine may decrease the number of white blood cells, red blood cells and platelets. You may be at increased risk for infections and bleeding. -signs of decreased platelets or bleeding - bruising, pinpoint red spots on the skin, black, tarry stools, blood in the urine -signs of infection - fever or chills, cough, sore throat, pain or trouble passing urine -signs and symptoms of liver injury like dark yellow or brown urine; general ill feeling or flu-like symptoms; light-colored stools; loss of appetite; nausea; right upper belly pain; unusually weak or tired; yellowing of the eyes or skin -trouble speaking or understanding -trouble walking -vomiting Side effects that usually do not require medical attention (report to your doctor or health care professional if they continue or are bothersome): -constipation -joint pain -muscle pain This list may not describe all possible side effects. Call your doctor for medical advice about side effects. You may report side effects to FDA at 1-800-FDA-1088. Where should I keep my medicine? This drug is only given in a hospital or clinic and will not be stored at home. NOTE: This sheet is a summary. It may not cover all possible information. If you have questions about this medicine, talk to your doctor, pharmacist, or health care provider.  2019 Elsevier/Gold Standard (2015-10-18 08:54:03)

## 2019-02-03 NOTE — Progress Notes (Signed)
Pt found to be sitting on the floor up against wall in between B7 chair and wall.  Pt state he was bending down to unplug IV pole to go to the restroom and he stumbled.  VSS, no c/o pain.  No visible injuries. Pt was assisted back to chair by staff.  Once stability confirmed staff member walked with pt to restroom.  No other issues. Dr Irene Limbo notified and pt's wife notified. SZP entered and Fall Huddle completed. Pt was discharged via wheelchair per request d/t he says "I didn't realize I was that weak".   Pt was observed getting into vehicle without any difficulty.

## 2019-02-03 NOTE — Telephone Encounter (Signed)
Oral Oncology Pharmacist Encounter  Received call from patient's wife this morning with questions about appointments that were scheduled for today. Patient with lab, office visit, and 7-hour infusion appointment. Infusion appointment is for patient's first infusion of Gazyva.  Mrs. Hearn states that she was not aware of addition of a new medication for the treatment of patient's CLL. Patient is a fairly poor historian and since visit her restrictions have been put in place due to the COVID-19 pandemic Mrs. Hennings has not been able to be present at patient's office visits with MD.  We discussed indication for [facility], risk of infusion related reactions, and common side effects.  I have alerted MD and collaborative practice RN that patient's wife requests to be on the phone during office visit today to have some additional questions answered. She also requests a call from infusion RN when patient's appointment is completed so that she knows when to come back and pick up patient.  Hepatitis screen prior to [facility] first infusion has not been ordered and Gazyva appointments are not correct. I have alerted the infusion pharmacy so that treatment plan can be properly assessed prior to new start.  Johny Drilling, PharmD, BCPS, BCOP  02/03/2019 9:00 AM Oral Oncology Clinic [phone]

## 2019-02-04 ENCOUNTER — Other Ambulatory Visit: Payer: Self-pay

## 2019-02-04 ENCOUNTER — Inpatient Hospital Stay: Payer: Medicare HMO

## 2019-02-04 ENCOUNTER — Telehealth: Payer: Self-pay | Admitting: *Deleted

## 2019-02-04 ENCOUNTER — Other Ambulatory Visit: Payer: Medicare HMO

## 2019-02-04 VITALS — BP 143/82 | HR 69 | Temp 98.7°F | Resp 16

## 2019-02-04 DIAGNOSIS — E785 Hyperlipidemia, unspecified: Secondary | ICD-10-CM | POA: Diagnosis not present

## 2019-02-04 DIAGNOSIS — K5730 Diverticulosis of large intestine without perforation or abscess without bleeding: Secondary | ICD-10-CM | POA: Diagnosis not present

## 2019-02-04 DIAGNOSIS — C9110 Chronic lymphocytic leukemia of B-cell type not having achieved remission: Secondary | ICD-10-CM

## 2019-02-04 DIAGNOSIS — Z5111 Encounter for antineoplastic chemotherapy: Secondary | ICD-10-CM | POA: Diagnosis not present

## 2019-02-04 DIAGNOSIS — I10 Essential (primary) hypertension: Secondary | ICD-10-CM | POA: Diagnosis not present

## 2019-02-04 DIAGNOSIS — Z7189 Other specified counseling: Secondary | ICD-10-CM

## 2019-02-04 DIAGNOSIS — K219 Gastro-esophageal reflux disease without esophagitis: Secondary | ICD-10-CM | POA: Diagnosis not present

## 2019-02-04 DIAGNOSIS — J439 Emphysema, unspecified: Secondary | ICD-10-CM | POA: Diagnosis not present

## 2019-02-04 DIAGNOSIS — R61 Generalized hyperhidrosis: Secondary | ICD-10-CM | POA: Diagnosis not present

## 2019-02-04 DIAGNOSIS — I700 Atherosclerosis of aorta: Secondary | ICD-10-CM | POA: Diagnosis not present

## 2019-02-04 LAB — HEPATITIS B SURFACE ANTIGEN: Hepatitis B Surface Ag: NEGATIVE

## 2019-02-04 LAB — HEPATITIS B SURFACE ANTIBODY,QUALITATIVE: Hep B S Ab: NONREACTIVE

## 2019-02-04 LAB — HEPATITIS B CORE ANTIBODY, TOTAL: Hep B Core Total Ab: NEGATIVE

## 2019-02-04 MED ORDER — SODIUM CHLORIDE 0.9 % IV SOLN
20.0000 mg | Freq: Once | INTRAVENOUS | Status: AC
  Administered 2019-02-04: 20 mg via INTRAVENOUS
  Filled 2019-02-04: qty 20

## 2019-02-04 MED ORDER — SODIUM CHLORIDE 0.9 % IV SOLN
Freq: Once | INTRAVENOUS | Status: AC
  Administered 2019-02-04: 09:00:00 via INTRAVENOUS
  Filled 2019-02-04: qty 250

## 2019-02-04 MED ORDER — ACETAMINOPHEN 325 MG PO TABS
650.0000 mg | ORAL_TABLET | Freq: Once | ORAL | Status: AC
  Administered 2019-02-04: 650 mg via ORAL

## 2019-02-04 MED ORDER — DIPHENHYDRAMINE HCL 25 MG PO CAPS
ORAL_CAPSULE | ORAL | Status: AC
  Filled 2019-02-04: qty 2

## 2019-02-04 MED ORDER — FAMOTIDINE IN NACL 20-0.9 MG/50ML-% IV SOLN
20.0000 mg | Freq: Once | INTRAVENOUS | Status: AC
  Administered 2019-02-04: 20 mg via INTRAVENOUS

## 2019-02-04 MED ORDER — DIPHENHYDRAMINE HCL 50 MG/ML IJ SOLN
50.0000 mg | Freq: Once | INTRAMUSCULAR | Status: AC
  Administered 2019-02-04: 50 mg via INTRAVENOUS

## 2019-02-04 MED ORDER — FAMOTIDINE IN NACL 20-0.9 MG/50ML-% IV SOLN
INTRAVENOUS | Status: AC
  Filled 2019-02-04: qty 50

## 2019-02-04 MED ORDER — DIPHENHYDRAMINE HCL 50 MG/ML IJ SOLN
INTRAMUSCULAR | Status: AC
  Filled 2019-02-04: qty 1

## 2019-02-04 MED ORDER — ACETAMINOPHEN 325 MG PO TABS
ORAL_TABLET | ORAL | Status: AC
  Filled 2019-02-04: qty 2

## 2019-02-04 MED ORDER — SODIUM CHLORIDE 0.9 % IV SOLN
900.0000 mg | Freq: Once | INTRAVENOUS | Status: AC
  Administered 2019-02-04: 900 mg via INTRAVENOUS
  Filled 2019-02-04: qty 36

## 2019-02-04 NOTE — Telephone Encounter (Signed)
Called to f/u for first day chemo w/Gazyva. Wife answered phone, states patient is doing very well, has no complaints. Asked her to encourage patient to stay well hydrated. She verbalized understanding

## 2019-02-04 NOTE — Patient Instructions (Signed)
[facility] Discharge Instructions for Patients Receiving Chemotherapy  Today you received the following chemotherapy agents: Gazyva   To help prevent nausea and vomiting after your treatment, we encourage you to take your nausea medication as directed.    If you develop nausea and vomiting that is not controlled by your nausea medication, call the clinic.   BELOW ARE SYMPTOMS THAT SHOULD BE REPORTED IMMEDIATELY:  *FEVER GREATER THAN 100.5 F  *CHILLS WITH OR WITHOUT FEVER  NAUSEA AND VOMITING THAT IS NOT CONTROLLED WITH YOUR NAUSEA MEDICATION  *UNUSUAL SHORTNESS OF BREATH  *UNUSUAL BRUISING OR BLEEDING  TENDERNESS IN MOUTH AND THROAT WITH OR WITHOUT PRESENCE OF ULCERS  *URINARY PROBLEMS  *BOWEL PROBLEMS  UNUSUAL RASH Items with * indicate a potential emergency and should be followed up as soon as possible.  Feel free to call the clinic should you have any questions or concerns. The clinic phone number is [phone].  Please show the CHEMO ALERT CARD at check-in to the Emergency Department and triage nurse.   

## 2019-02-09 ENCOUNTER — Other Ambulatory Visit: Payer: Self-pay | Admitting: Adult Health Nurse Practitioner

## 2019-02-09 DIAGNOSIS — I10 Essential (primary) hypertension: Secondary | ICD-10-CM

## 2019-02-09 NOTE — Progress Notes (Signed)
HEMATOLOGY/ONCOLOGY CLINIC NOTE  Date of Service: 02/10/19   Patient Care Team: Unk Pinto, MD as PCP - General (Internal Medicine) Inda Castle, MD (Inactive) as Consulting Physician (Gastroenterology) Melissa Noon, [place] as Referring Physician (Optometry) Danis, Kirke Corin, MD as Consulting Physician (Gastroenterology) Brunetta Genera, MD as Consulting Physician (Hematology)  CHIEF COMPLAINTS/PURPOSE OF CONSULTATION:  F/u for CD5 neg CLL   HISTORY OF PRESENTING ILLNESS:   Javier [name] is a wonderful 78 y.o. male who has been referred to [place] by Dr .Unk Pinto, MD for evaluation and management of lymphocytosis concerning for a lymphoproliferative process.  Patient has a h/o HTN, HLD, Prediabetes who notes that he had significant abdominal discomfort with significant dyspeptic symptoms around aug 2017 and had about 20-25lbs weight loss and had an extensive GI workup including CT abd/pelvis 05/25/2016 - WNL, EGD which showed chronic gastritis (helicobacter pylori +ve) and colonscopy with some polyps. Capsule endoscopy was not approved by insurance. Patients symptoms improved after treatment of H.pylori and he notes that he has gained back about 6-7 lbs and is eating better.  Patients labs were noted to show some leucocytosis/Lymphocytosis for which he was referred to [place] for further evaluation.  Patient notes some nightsweats and weight loss (as noted above, possible alternative explanation). No fevers/chills. Has not noted any overt enlarged LN and CT abd in 04/2016 showed no hepatomegaly or splenomegaly and no abd/RP LNadenopathy.  Patient notes no other acute focal symptoms.   INTERVAL HISTORY  Javier [name] is here for management and evaluation of his CLL. The patient's last visit with [place] was on 02/03/19. The pt reports that he is doing well overall.  The pt reports that his blood pressure has been fluctuating at home, today in clinic his BP is 134/65. He notes  that he had two nights of night sweats in the past week which were worse than they have been, noting that his night sweats have been otherwise overall much improved. He denies fevers, chills, SOB, skin rashes or diarrhea. He notes that he tolerated his first week of Gazyva infusions well.   The pt endorses occasional difficulty with memory, denies this being very frequent or noticeably different recently. He notes that he is sleeping well and is eating well. He denies walking very much or staying very active.   Lab results today (02/10/19) of CBC w/diff and CMP is as follows: all values are WNL except for WBC at 3.5k, PLT at 104k, ANC at 1.5k, Potassium at 3.4, Glucose at 104. 02/10/19 Uric acid at 5.5  On review of systems, pt reports recently worsened night sweats, stable energy levels, sleeping well, eating well, and denies fevers, chills, skin rashes, SOB, diarrhea, abdominal pains, leg swelling, and any other symptoms.   MEDICAL HISTORY:  Past Medical History:  Diagnosis Date  . Diverticulosis   . Hyperlipidemia   . Hypertension   . Prediabetes   . Vitamin D deficiency   Helicobacter pylori +ve   SURGICAL HISTORY: Past Surgical History:  Procedure Laterality Date  . NO PAST SURGERIES      SOCIAL HISTORY: Social History   Socioeconomic History  . Marital status: Married    Spouse name: Not on file  . Number of children: 5  . Years of education: Not on file  . Highest education level: Not on file  Occupational History  . Occupation: retired  Scientific laboratory technician  . Financial resource strain: Not on file  . Food insecurity:  Worry: Not on file    Inability: Not on file  . Transportation needs:    Medical: Not on file    Non-medical: Not on file  Tobacco Use  . Smoking status: Former Smoker    Types: Cigarettes    Last attempt to quit: 01/17/2003    Years since quitting: 16.0  . Smokeless tobacco: Never Used  Substance and Sexual Activity  . Alcohol use: Yes    Comment:  very rare  . Drug use: No  . Sexual activity: Not on file  Lifestyle  . Physical activity:    Days per week: Not on file    Minutes per session: Not on file  . Stress: Not on file  Relationships  . Social connections:    Talks on phone: Not on file    Gets together: Not on file    Attends religious service: Not on file    Active member of club or organization: Not on file    Attends meetings of clubs or organizations: Not on file    Relationship status: Not on file  . Intimate partner violence:    Fear of current or ex partner: Not on file    Emotionally abused: Not on file    Physically abused: Not on file    Forced sexual activity: Not on file  Other Topics Concern  . Not on file  Social History Narrative  . Not on file  Quit smoking >10 yrs ago, previously 1.5Packs per week. Started smoking in his teens. Retired. Worked as a Chief Financial Officer.   FAMILY HISTORY: Family History  Problem Relation Age of Onset  . Stroke Mother   . Early death Father        Farm/tractor accident  . Diabetes Brother   . Cirrhosis Brother   . Colon cancer Paternal Grandfather     ALLERGIES:  is allergic to penicillins and ppd [tuberculin purified protein derivative].  MEDICATIONS:  Current Outpatient Medications  Medication Sig Dispense Refill  . ALPRAZolam (XANAX) 0.5 MG tablet Take 1/2-1 tablet 2 - 3 x /day ONLY if needed for Anxiety Attack &  limit to 5 days /week to avoid addiction 90 tablet 0  . amLODipine (NORVASC) 2.5 MG tablet One pill at night for blood pressure 90 tablet 2  . aspirin EC 81 MG tablet Take 81 mg by mouth daily.    . Cholecalciferol (VITAMIN D3) 5000 units TABS Take 5,000 Units by mouth daily.    Marland Kitchen losartan-hydrochlorothiazide (HYZAAR) 100-25 MG tablet Take 1 tablet Daily for BP & Fluid 90 tablet 1  . omeprazole (PRILOSEC) 40 MG capsule Take 1 capsule (40 mg total) by mouth daily. 90 capsule 1  . pravastatin (PRAVACHOL) 40 MG tablet TAKE 1 TABLET AT BEDTIME  FOR   CHOLESTEROL (Patient taking differently: Take 40 mg by mouth at bedtime. ) 90 tablet 3  . triamcinolone (NASACORT) 55 MCG/ACT AERO nasal inhaler Place 2 sprays into the nose at bedtime. 1 Inhaler 3  . venetoclax 100 MG TABS Take 400 mg by mouth daily. Take with food and water 120 tablet 2   No current facility-administered medications for this visit.     REVIEW OF SYSTEMS:    A 10+ POINT REVIEW OF SYSTEMS WAS OBTAINED including neurology, dermatology, psychiatry, cardiac, respiratory, lymph, extremities, GI, GU, Musculoskeletal, constitutional, breasts, reproductive, HEENT.  All pertinent positives are noted in the HPI.  All others are negative.   PHYSICAL EXAMINATION: ECOG PERFORMANCE STATUS: 1 - Symptomatic but completely  ambulatory  Vitals:   02/10/19 0838  BP: 134/65  Pulse: 76  Resp: 18  Temp: 99.1 F (37.3 C)  SpO2: 100%   Filed Weights   02/10/19 0838  Weight: 179 lb 9.6 oz (81.5 kg)   .Body mass index is 25.05 kg/m.  GENERAL:alert, in no acute distress and comfortable SKIN: no acute rashes, no significant lesions EYES: conjunctiva are pink and non-injected, sclera anicteric OROPHARYNX: MMM, no exudates, no oropharyngeal erythema or ulceration NECK: supple, no JVD LYMPH:  no palpable lymphadenopathy in the cervical, axillary or inguinal regions LUNGS: clear to auscultation b/l with normal respiratory effort HEART: regular rate & rhythm ABDOMEN:  normoactive bowel sounds , non tender, not distended. No palpable hepatosplenomegaly.  Extremity: no pedal edema PSYCH: alert & oriented x 3 with fluent speech NEURO: no focal motor/sensory deficits   LABORATORY DATA:  I have reviewed the data as listed  . CBC Latest Ref Rng & Units 02/10/2019 02/03/2019 01/24/2019  WBC 4.0 - 10.5 K/uL 3.5(L) 15.9(H) 57.7(HH)  Hemoglobin 13.0 - 17.0 g/dL 13.1 13.4 13.2  Hematocrit 39.0 - 52.0 % 40.3 42.4 43.4  Platelets 150 - 400 K/uL 104(L) 144(L) 151   . CBC    Component Value  Date/Time   WBC 3.5 (L) 02/10/2019 0811   RBC 4.45 02/10/2019 0811   HGB 13.1 02/10/2019 0811   HGB 12.2 (L) 01/03/2019 1509   HGB 14.5 10/21/2016 1324   HCT 40.3 02/10/2019 0811   HCT 43.8 10/21/2016 1324   PLT 104 (L) 02/10/2019 0811   PLT 152 01/03/2019 1509   PLT 157 10/21/2016 1324   MCV 90.6 02/10/2019 0811   MCV 90.9 10/21/2016 1324   MCH 29.4 02/10/2019 0811   MCHC 32.5 02/10/2019 0811   RDW 13.2 02/10/2019 0811   RDW 13.3 10/21/2016 1324   LYMPHSABS 1.5 02/10/2019 0811   LYMPHSABS 17.6 (H) 10/21/2016 1324   MONOABS 0.5 02/10/2019 0811   MONOABS 0.6 10/21/2016 1324   EOSABS 0.0 02/10/2019 0811   EOSABS 0.0 10/21/2016 1324   BASOSABS 0.0 02/10/2019 0811   BASOSABS 0.1 10/21/2016 1324     . CMP Latest Ref Rng & Units 02/10/2019 02/03/2019 01/24/2019  Glucose 70 - 99 mg/dL 104(H) 96 81  BUN 8 - 23 mg/dL _0 Creatinine 0.61 - 1.24 mg/dL 1.20 1.19 1.19  Sodium 135 - 145 mmol/L 140 140 142  Potassium 3.5 - 5.1 mmol/L 3.4(L) 3.6 3.9  Chloride 98 - 111 mmol/L 106 108 107  CO2 22 - 32 mmol/L _1 Calcium 8.9 - 10.3 mg/dL 9.2 9.5 9.6  Total Protein 6.5 - 8.1 g/dL 6.5 7.2 6.9  Total Bilirubin 0.3 - 1.2 mg/dL 0.6 0.5 0.4  Alkaline Phos 38 - 126 U/L 71 62 65  AST 15 - 41 U/L 16 13(L) 13(L)  ALT 0 - 44 U/L _2 . Lab Results  Component Value Date   LDH 172 02/03/2019         RADIOGRAPHIC STUDIES: I have personally reviewed the radiological images as listed and agreed with the findings in the report. No results found.  ASSESSMENT & PLAN:   78 y.o. 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 FISH panel showed p53 (17p13) deletion that would be consistent with this diagnosis . It often represents also be more aggressive form of CLL Differential diagnosis includes - CD5 neg CLL vs splenic lymphoma vs Marginal Zone  lymphoma vs LPL (lymphoplasmacytic lymphoma) LDH WNL suggests against a high grade  process. Currently no anemia or thrombocytopenia noted. Now with drenching night sweats and fatigue, no fevers/chills.  10/31/16 PET/CT scan shows some small hypermetabolic nodes in the thorax including bilateral hilar paratracheal) esophageal. These would be difficult to biopsy.  Lost to follow up between 11/06/16 and 11/19/18  12/01/18 PET/CT revealed Stable scattered small hypermetabolic thoracic lymph nodes, once again at [place] 5 activity level. 2. No findings of involvement in the neck, abdomen/pelvis, or skeleton. 3. Prostatomegaly. 4. Aortic Atherosclerosis and Emphysema 5. Sigmoid colon diverticulosis  PLAN -Discussed pt labwork today, 02/10/19; lymphocytes have normalized to 1.5k and total WBC at 3.5k. PLT lower at 104k and will monitor these. HGB normal and stable at 13.1. Chemistries are stable. Uric acid normal at 5.5 -Will see the pt back in one week to monitor platelet counts and will consider dose adjustments if necessary -The pt has no prohibitive toxicities from continuing 430m Venetoclax at this time.  -The pt has no prohibitive toxicities from continuing [place] this time. -First month of Gazyva with 4 infusions on D1, D2, D8 and D15, followed by once a month. -Hold 1025mAllopurinol BID at this time with normalized lymphocyte count and normal uric acid levels -Pt has been able to consume at least 48-60 oz of water each day, and stressed that he should continue to do this, and we will hold IV fluids in light of this -Advised raising the head of his bed and not eating food within 2 hours of ideal bed time -Will continue to monitor labs weekly and see pt weekly during first month of treatment. -Discussed that I recommend following up with Dr. McMelford Aaseurther regarding dyspepsia and feeling of impending doom, and rule out cardiologic etiology -Recommend following up with PCP for prostatomegaly management and recommendations. -Will see the pt back in 1 week   2) . Patient  Active Problem List   Diagnosis Date Noted  . Counseling regarding advance care planning and goals of care 01/24/2019  . Anxiety 02/08/2018  . CLL (chronic lymphocytic leukemia) ([place]/11/2016  . Encounter for Medicare annual wellness exam 09/11/2015  . GERD  02/09/2015  . Medication management 11/02/2013  . Hyperlipidemia   . Hypertension   . Abnormal glucose   . Vitamin D deficiency   . Diverticulosis    -f/u with PCP for management of other medical co-morbids  3) H.pylori gastritis -f/u with GI to ensure eradication of h.pylori post treatment.   F/u for next treatments with GaDyann Kiefs scheduled Plz add MD visit to add on 5/21   All of the patients questions were answered with apparent satisfaction. The patient knows to call the clinic with any problems, questions or concerns.  The total time spent in the appt was 25 minutes and more than 50% was on counseling and direct patient cares.    GaSullivan LoneD MS AAHIVMS SCSanford Tracy Medical CenterTLimestone Medical Centerematology/Oncology Physician CoVibra Hospital Of Amarillo(Office):       [phone] cell):  [phone]):           [phone]/14/2020 9:51 AM  I, ScBaldwin Jamaicaam acting as a scribe for Dr. GaSullivan Lone  .I have reviewed the above documentation for accuracy and completeness, and I agree with the above. .GBrunetta GeneraD

## 2019-02-10 ENCOUNTER — Inpatient Hospital Stay (HOSPITAL_BASED_OUTPATIENT_CLINIC_OR_DEPARTMENT_OTHER): Payer: Medicare HMO | Admitting: Hematology

## 2019-02-10 ENCOUNTER — Inpatient Hospital Stay: Payer: Medicare HMO

## 2019-02-10 ENCOUNTER — Other Ambulatory Visit: Payer: Self-pay

## 2019-02-10 VITALS — BP 122/62 | HR 63 | Temp 98.4°F | Resp 15

## 2019-02-10 VITALS — BP 134/65 | HR 76 | Temp 99.1°F | Resp 18 | Ht 71.0 in | Wt 179.6 lb

## 2019-02-10 DIAGNOSIS — C9110 Chronic lymphocytic leukemia of B-cell type not having achieved remission: Secondary | ICD-10-CM

## 2019-02-10 DIAGNOSIS — I10 Essential (primary) hypertension: Secondary | ICD-10-CM | POA: Diagnosis not present

## 2019-02-10 DIAGNOSIS — Z79899 Other long term (current) drug therapy: Secondary | ICD-10-CM

## 2019-02-10 DIAGNOSIS — J439 Emphysema, unspecified: Secondary | ICD-10-CM | POA: Diagnosis not present

## 2019-02-10 DIAGNOSIS — I700 Atherosclerosis of aorta: Secondary | ICD-10-CM | POA: Diagnosis not present

## 2019-02-10 DIAGNOSIS — Z9189 Other specified personal risk factors, not elsewhere classified: Secondary | ICD-10-CM

## 2019-02-10 DIAGNOSIS — K219 Gastro-esophageal reflux disease without esophagitis: Secondary | ICD-10-CM | POA: Diagnosis not present

## 2019-02-10 DIAGNOSIS — R61 Generalized hyperhidrosis: Secondary | ICD-10-CM | POA: Diagnosis not present

## 2019-02-10 DIAGNOSIS — Q9389 Other deletions from the autosomes: Secondary | ICD-10-CM

## 2019-02-10 DIAGNOSIS — K5730 Diverticulosis of large intestine without perforation or abscess without bleeding: Secondary | ICD-10-CM | POA: Diagnosis not present

## 2019-02-10 DIAGNOSIS — E785 Hyperlipidemia, unspecified: Secondary | ICD-10-CM | POA: Diagnosis not present

## 2019-02-10 DIAGNOSIS — Z5111 Encounter for antineoplastic chemotherapy: Secondary | ICD-10-CM | POA: Diagnosis not present

## 2019-02-10 DIAGNOSIS — Z7189 Other specified counseling: Secondary | ICD-10-CM

## 2019-02-10 LAB — CMP (CANCER CENTER ONLY)
ALT: 22 U/L (ref 0–44)
AST: 16 U/L (ref 15–41)
Albumin: 3.5 g/dL (ref 3.5–5.0)
Alkaline Phosphatase: 71 U/L (ref 38–126)
Anion gap: 10 (ref 5–15)
BUN: 19 mg/dL (ref 8–23)
CO2: 24 mmol/L (ref 22–32)
Calcium: 9.2 mg/dL (ref 8.9–10.3)
Chloride: 106 mmol/L (ref 98–111)
Creatinine: 1.2 mg/dL (ref 0.61–1.24)
GFR, Est AFR Am: >60 mL/min (ref >60)
GFR, Estimated: 58 mL/min — ABNORMAL LOW (ref >60)
Glucose, Bld: 104 mg/dL — ABNORMAL HIGH (ref 70–99)
Potassium: 3.4 mmol/L — ABNORMAL LOW (ref 3.5–5.1)
Sodium: 140 mmol/L (ref 135–145)
Total Bilirubin: 0.6 mg/dL (ref 0.3–1.2)
Total Protein: 6.5 g/dL (ref 6.5–8.1)

## 2019-02-10 LAB — CBC WITH DIFFERENTIAL/PLATELET
Abs Immature Granulocytes: 0.01 10*3/uL (ref 0.00–0.07)
Basophils Absolute: 0 10*3/uL (ref 0.0–0.1)
Basophils Relative: 0 %
Eosinophils Absolute: 0 10*3/uL (ref 0.0–0.5)
Eosinophils Relative: 0 %
HCT: 40.3 % (ref 39.0–52.0)
Hemoglobin: 13.1 g/dL (ref 13.0–17.0)
Immature Granulocytes: 0 %
Lymphocytes Relative: 42 %
Lymphs Abs: 1.5 10*3/uL (ref 0.7–4.0)
MCH: 29.4 pg (ref 26.0–34.0)
MCHC: 32.5 g/dL (ref 30.0–36.0)
MCV: 90.6 fL (ref 80.0–100.0)
Monocytes Absolute: 0.5 10*3/uL (ref 0.1–1.0)
Monocytes Relative: 14 %
Neutro Abs: 1.5 10*3/uL — ABNORMAL LOW (ref 1.7–7.7)
Neutrophils Relative %: 44 %
Platelets: 104 10*3/uL — ABNORMAL LOW (ref 150–400)
RBC: 4.45 MIL/uL (ref 4.22–5.81)
RDW: 13.2 % (ref 11.5–15.5)
WBC: 3.5 10*3/uL — ABNORMAL LOW (ref 4.0–10.5)
nRBC: 0 % (ref 0.0–0.2)

## 2019-02-10 LAB — URIC ACID: Uric Acid, Serum: 5.5 mg/dL (ref 3.7–8.6)

## 2019-02-10 MED ORDER — FAMOTIDINE IN NACL 20-0.9 MG/50ML-% IV SOLN
20.0000 mg | Freq: Once | INTRAVENOUS | Status: AC
  Administered 2019-02-10: 10:00:00 20 mg via INTRAVENOUS

## 2019-02-10 MED ORDER — SODIUM CHLORIDE 0.9 % IV SOLN
1000.0000 mg | Freq: Once | INTRAVENOUS | Status: AC
  Administered 2019-02-10: 12:00:00 1000 mg via INTRAVENOUS
  Filled 2019-02-10: qty 40

## 2019-02-10 MED ORDER — DIPHENHYDRAMINE HCL 50 MG/ML IJ SOLN
INTRAMUSCULAR | Status: AC
  Filled 2019-02-10: qty 1

## 2019-02-10 MED ORDER — SODIUM CHLORIDE 0.9 % IV SOLN
Freq: Once | INTRAVENOUS | Status: AC
  Administered 2019-02-10: 10:00:00 via INTRAVENOUS
  Filled 2019-02-10: qty 250

## 2019-02-10 MED ORDER — SODIUM CHLORIDE 0.9 % IV SOLN
20.0000 mg | Freq: Once | INTRAVENOUS | Status: AC
  Administered 2019-02-10: 11:00:00 20 mg via INTRAVENOUS
  Filled 2019-02-10: qty 20

## 2019-02-10 MED ORDER — ACETAMINOPHEN 325 MG PO TABS
ORAL_TABLET | ORAL | Status: AC
  Filled 2019-02-10: qty 2

## 2019-02-10 MED ORDER — FAMOTIDINE IN NACL 20-0.9 MG/50ML-% IV SOLN
INTRAVENOUS | Status: AC
  Filled 2019-02-10: qty 50

## 2019-02-10 MED ORDER — ACETAMINOPHEN 325 MG PO TABS
650.0000 mg | ORAL_TABLET | Freq: Once | ORAL | Status: AC
  Administered 2019-02-10: 10:00:00 650 mg via ORAL

## 2019-02-10 MED ORDER — DIPHENHYDRAMINE HCL 50 MG/ML IJ SOLN
50.0000 mg | Freq: Once | INTRAMUSCULAR | Status: AC
  Administered 2019-02-10: 10:00:00 50 mg via INTRAVENOUS

## 2019-02-10 MED ORDER — DIPHENHYDRAMINE HCL 25 MG PO CAPS
ORAL_CAPSULE | ORAL | Status: AC
  Filled 2019-02-10: qty 2

## 2019-02-10 NOTE — Patient Instructions (Signed)
[facility] Discharge Instructions for Patients Receiving Chemotherapy  Today you received the following chemotherapy agents: Gazyva   To help prevent nausea and vomiting after your treatment, we encourage you to take your nausea medication as directed.    If you develop nausea and vomiting that is not controlled by your nausea medication, call the clinic.   BELOW ARE SYMPTOMS THAT SHOULD BE REPORTED IMMEDIATELY:  *FEVER GREATER THAN 100.5 F  *CHILLS WITH OR WITHOUT FEVER  NAUSEA AND VOMITING THAT IS NOT CONTROLLED WITH YOUR NAUSEA MEDICATION  *UNUSUAL SHORTNESS OF BREATH  *UNUSUAL BRUISING OR BLEEDING  TENDERNESS IN MOUTH AND THROAT WITH OR WITHOUT PRESENCE OF ULCERS  *URINARY PROBLEMS  *BOWEL PROBLEMS  UNUSUAL RASH Items with * indicate a potential emergency and should be followed up as soon as possible.  Feel free to call the clinic should you have any questions or concerns. The clinic phone number is [phone].  Please show the CHEMO ALERT CARD at check-in to the Emergency Department and triage nurse.   

## 2019-02-11 ENCOUNTER — Telehealth: Payer: Self-pay | Admitting: Hematology

## 2019-02-11 NOTE — Telephone Encounter (Signed)
Scheduled appt per 5/14 los.  Patient aware of appt date an time.

## 2019-02-17 ENCOUNTER — Other Ambulatory Visit: Payer: Medicare HMO

## 2019-02-17 MED FILL — VENCLEXTA 100 MG TABS: 100 | 30 days supply | Qty: 120 | Fill #1

## 2019-02-17 NOTE — Progress Notes (Signed)
HEMATOLOGY/ONCOLOGY CLINIC NOTE  Date of Service: 02/18/19   Patient Care Team: Unk Pinto, MD as PCP - General (Internal Medicine) Inda Castle, MD (Inactive) as Consulting Physician (Gastroenterology) Melissa Noon, [place] as Referring Physician (Optometry) Danis, Kirke Corin, MD as Consulting Physician (Gastroenterology) Brunetta Genera, MD as Consulting Physician (Hematology)  CHIEF COMPLAINTS/PURPOSE OF CONSULTATION:  F/u for CD5 neg CLL   HISTORY OF PRESENTING ILLNESS:   Javier [name] is a wonderful 78 y.o. male who has been referred to [place] by Dr .Unk Pinto, MD for evaluation and management of lymphocytosis concerning for a lymphoproliferative process.  Patient has a h/o HTN, HLD, Prediabetes who notes that he had significant abdominal discomfort with significant dyspeptic symptoms around aug 2017 and had about 20-25lbs weight loss and had an extensive GI workup including CT abd/pelvis 05/25/2016 - WNL, EGD which showed chronic gastritis (helicobacter pylori +ve) and colonscopy with some polyps. Capsule endoscopy was not approved by insurance. Patients symptoms improved after treatment of H.pylori and he notes that he has gained back about 6-7 lbs and is eating better.  Patients labs were noted to show some leucocytosis/Lymphocytosis for which he was referred to [place] for further evaluation.  Patient notes some nightsweats and weight loss (as noted above, possible alternative explanation). No fevers/chills. Has not noted any overt enlarged LN and CT abd in 04/2016 showed no hepatomegaly or splenomegaly and no abd/RP LNadenopathy.  Patient notes no other acute focal symptoms.   INTERVAL HISTORY  Javier [name] is here for management and evaluation of his CLL. The patient's last visit with [place] was on 02/10/19. The pt reports that he is doing well overall. He is accompanied today by his wife via [employer].  The pt reports that he has had stable energy levels and  that he has continued to be able to do all he wants to do at this time. He has continued mowing his lawn and keeps up with his chores and household responsibilities. He notes that he has been eating well. He notes that his night sweats have "been a whole lot better." He denies any skin rashes or concerns for infections. The pt notes that he has continued tolerating both Gazyva and Venetoclax very well.  Lab results today (02/18/19) of CBC w/diff and CMP is as follows: all values are WNL except for WBC at 3.4k, HGB at 12.8, Glucose at 112, AST at 14. 02/18/19 Uric acid at 4.2  On review of systems, pt reports eating well, stable energy levels, staying active, and denies skin rashes, concerns for infections, and any other symptoms.   MEDICAL HISTORY:  Past Medical History:  Diagnosis Date  . Diverticulosis   . Hyperlipidemia   . Hypertension   . Prediabetes   . Vitamin D deficiency   Helicobacter pylori +ve   SURGICAL HISTORY: Past Surgical History:  Procedure Laterality Date  . NO PAST SURGERIES      SOCIAL HISTORY: Social History   Socioeconomic History  . Marital status: Married    Spouse name: Not on file  . Number of children: 5  . Years of education: Not on file  . Highest education level: Not on file  Occupational History  . Occupation: retired  Scientific laboratory technician  . Financial resource strain: Not on file  . Food insecurity:    Worry: Not on file    Inability: Not on file  . Transportation needs:    Medical: Not on file    Non-medical:  Not on file  Tobacco Use  . Smoking status: Former Smoker    Types: Cigarettes    Last attempt to quit: 01/17/2003    Years since quitting: 16.0  . Smokeless tobacco: Never Used  Substance and Sexual Activity  . Alcohol use: Yes    Comment: very rare  . Drug use: No  . Sexual activity: Not on file  Lifestyle  . Physical activity:    Days per week: Not on file    Minutes per session: Not on file  . Stress: Not on file   Relationships  . Social connections:    Talks on phone: Not on file    Gets together: Not on file    Attends religious service: Not on file    Active member of club or organization: Not on file    Attends meetings of clubs or organizations: Not on file    Relationship status: Not on file  . Intimate partner violence:    Fear of current or ex partner: Not on file    Emotionally abused: Not on file    Physically abused: Not on file    Forced sexual activity: Not on file  Other Topics Concern  . Not on file  Social History Narrative  . Not on file  Quit smoking >10 yrs ago, previously 1.5Packs per week. Started smoking in his teens. Retired. Worked as a Chief Financial Officer.   FAMILY HISTORY: Family History  Problem Relation Age of Onset  . Stroke Mother   . Early death Father        Farm/tractor accident  . Diabetes Brother   . Cirrhosis Brother   . Colon cancer Paternal Grandfather     ALLERGIES:  is allergic to penicillins and ppd [tuberculin purified protein derivative].  MEDICATIONS:  Current Outpatient Medications  Medication Sig Dispense Refill  . ALPRAZolam (XANAX) 0.5 MG tablet Take 1/2-1 tablet 2 - 3 x /day ONLY if needed for Anxiety Attack &  limit to 5 days /week to avoid addiction 90 tablet 0  . amLODipine (NORVASC) 2.5 MG tablet One pill at night for blood pressure 90 tablet 2  . aspirin EC 81 MG tablet Take 81 mg by mouth daily.    . Cholecalciferol (VITAMIN D3) 5000 units TABS Take 5,000 Units by mouth daily.    Marland Kitchen losartan-hydrochlorothiazide (HYZAAR) 100-25 MG tablet Take 1 tablet Daily for BP & Fluid 90 tablet 1  . omeprazole (PRILOSEC) 40 MG capsule Take 1 capsule (40 mg total) by mouth daily. 90 capsule 1  . pravastatin (PRAVACHOL) 40 MG tablet TAKE 1 TABLET AT BEDTIME  FOR  CHOLESTEROL (Patient taking differently: Take 40 mg by mouth at bedtime. ) 90 tablet 3  . triamcinolone (NASACORT) 55 MCG/ACT AERO nasal inhaler Place 2 sprays into the nose at bedtime. 1  Inhaler 3  . venetoclax 100 MG TABS Take 400 mg by mouth daily. Take with food and water 120 tablet 2   No current facility-administered medications for this visit.     REVIEW OF SYSTEMS:    A 10+ POINT REVIEW OF SYSTEMS WAS OBTAINED including neurology, dermatology, psychiatry, cardiac, respiratory, lymph, extremities, GI, GU, Musculoskeletal, constitutional, breasts, reproductive, HEENT.  All pertinent positives are noted in the HPI.  All others are negative.   PHYSICAL EXAMINATION: ECOG PERFORMANCE STATUS: 1 - Symptomatic but completely ambulatory  Vitals:   02/18/19 0941  BP: (!) 116/59  Pulse: 73  Resp: 18  Temp: 98.4 F (36.9 C)  SpO2: 100%  Filed Weights   02/18/19 0941  Weight: 180 lb 1.6 oz (81.7 kg)   .Body mass index is 25.12 kg/m.  GENERAL:alert, in no acute distress and comfortable SKIN: no acute rashes, no significant lesions EYES: conjunctiva are pink and non-injected, sclera anicteric OROPHARYNX: MMM, no exudates, no oropharyngeal erythema or ulceration NECK: supple, no JVD LYMPH:  no palpable lymphadenopathy in the cervical, axillary or inguinal regions LUNGS: clear to auscultation b/l with normal respiratory effort HEART: regular rate & rhythm ABDOMEN:  normoactive bowel sounds , non tender, not distended. No palpable hepatosplenomegaly.  Extremity: no pedal edema PSYCH: alert & oriented x 3 with fluent speech NEURO: no focal motor/sensory deficits   LABORATORY DATA:  I have reviewed the data as listed  . CBC Latest Ref Rng & Units 02/18/2019 02/10/2019 02/03/2019  WBC 4.0 - 10.5 K/uL 3.4(L) 3.5(L) 15.9(H)  Hemoglobin 13.0 - 17.0 g/dL 12.8(L) 13.1 13.4  Hematocrit 39.0 - 52.0 % 39.0 40.3 42.4  Platelets 150 - 400 K/uL 212 104(L) 144(L)   . CBC    Component Value Date/Time   WBC 3.4 (L) 02/18/2019 0843   RBC 4.26 02/18/2019 0843   HGB 12.8 (L) 02/18/2019 0843   HGB 12.2 (L) 01/03/2019 1509   HGB 14.5 10/21/2016 1324   HCT 39.0 02/18/2019 0843    HCT 43.8 10/21/2016 1324   PLT 212 02/18/2019 0843   PLT 152 01/03/2019 1509   PLT 157 10/21/2016 1324   MCV 91.5 02/18/2019 0843   MCV 90.9 10/21/2016 1324   MCH 30.0 02/18/2019 0843   MCHC 32.8 02/18/2019 0843   RDW 13.2 02/18/2019 0843   RDW 13.3 10/21/2016 1324   LYMPHSABS 1.1 02/18/2019 0843   LYMPHSABS 17.6 (H) 10/21/2016 1324   MONOABS 0.3 02/18/2019 0843   MONOABS 0.6 10/21/2016 1324   EOSABS 0.0 02/18/2019 0843   EOSABS 0.0 10/21/2016 1324   BASOSABS 0.0 02/18/2019 0843   BASOSABS 0.1 10/21/2016 1324     . CMP Latest Ref Rng & Units 02/18/2019 02/10/2019 02/03/2019  Glucose 70 - 99 mg/dL 112(H) 104(H) 96  BUN 8 - 23 mg/dL '14 19 14  ' Creatinine 0.61 - 1.24 mg/dL 1.15 1.20 1.19  Sodium 135 - 145 mmol/L 140 140 140  Potassium 3.5 - 5.1 mmol/L 3.6 3.4(L) 3.6  Chloride 98 - 111 mmol/L 105 106 108  CO2 22 - 32 mmol/L '25 24 23  ' Calcium 8.9 - 10.3 mg/dL 9.3 9.2 9.5  Total Protein 6.5 - 8.1 g/dL 6.7 6.5 7.2  Total Bilirubin 0.3 - 1.2 mg/dL 0.5 0.6 0.5  Alkaline Phos 38 - 126 U/L 64 71 62  AST 15 - 41 U/L 14(L) 16 13(L)  ALT 0 - 44 U/L '15 22 13   ' . Lab Results  Component Value Date   LDH 172 02/03/2019         RADIOGRAPHIC STUDIES: I have personally reviewed the radiological images as listed and agreed with the findings in the report. No results found.  ASSESSMENT & PLAN:   78 y.o. 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 FISH panel showed p53 (17p13) deletion that would be consistent with this diagnosis . It often represents also be more aggressive form of CLL Differential diagnosis includes - CD5 neg CLL vs splenic lymphoma vs Marginal Zone lymphoma vs LPL (lymphoplasmacytic lymphoma) LDH WNL suggests against a high grade process. Currently no anemia or thrombocytopenia noted. Now with drenching night sweats and fatigue, no  fevers/chills.  10/31/16 PET/CT scan shows some small hypermetabolic nodes in the  thorax including bilateral hilar paratracheal) esophageal. These would be difficult to biopsy.  Lost to follow up between 11/06/16 and 11/19/18  12/01/18 PET/CT revealed Stable scattered small hypermetabolic thoracic lymph nodes, once again at [place] 5 activity level. 2. No findings of involvement in the neck, abdomen/pelvis, or skeleton. 3. Prostatomegaly. 4. Aortic Atherosclerosis and Emphysema 5. Sigmoid colon diverticulosis  PLAN -Discussed pt labwork today, 02/18/19; WBC at 3.4k, ANC normalized to 2.0k, HGB stable at 12.8, PLT normalized at 212k. Chemistries normal. Uric acid normal at 4.2. -The pt has no prohibitive toxicities from continuing 485m Venetoclax at this time. Goal to continue this for 1-2 years per tolerance. -The pt has no prohibitive toxicities from continuing C1D15 Gazyva at this time. First month of Gazyva with infusions on D1, D2, D8, D15, followed by monthly for total of 6 months. -Pt has been able to consume at least 48-60 oz of water each day, and stressed that he should continue to do this, and we will hold IV fluids in light of this -Advised raising the head of his bed and not eating food within 2 hours of ideal bed time -Will continue to monitor labs weekly -Recommend following up with PCP for prostatomegaly management and recommendations -Recommended that the pt continue to eat well, drink at least 48-64 oz of water each day, and walk 20-30 minutes each day. -Will see the pt back in 2 weeks   2) . Patient Active Problem List   Diagnosis Date Noted  . Counseling regarding advance care planning and goals of care 01/24/2019  . Anxiety 02/08/2018  . C[place] (chronic lymphocytic [place]) [place] 11/01[place]2016  . Encounter for Medicare annual wellness exam 09/11/2015  . GERD  02/09/2015  . Medication management 11/02/2013  . Hyperlipidemia   . Hypertension   . Abnormal glucose   . Vitamin D deficiency   . Diverticulosis    -f/u with PCP for management of other medical  co-morbids  3) H.pylori gastritis -f/u with GI to ensure eradication of h.pylori post treatment.   F/u as per scheduled appointments on 6/4.   All of the patients questions were answered with apparent satisfaction. The patient knows to call the clinic with any problems, questions or concerns.  The total time spent in the appt was 30 minutes and more than 50% was on counseling and direct patient cares.    GSullivan LoneMD MS AAHIVMS SMission Oaks HospitalCDoctors HospitalHematology/Oncology Physician CMart[facility] (Office):       [phone](Work cell):  [phone](Fax):           [phone] 02/18/2019 10:32 AM  I, SBaldwin Jamaica am acting as a scribe for Dr. GSullivan Lone   .I have reviewed the above documentation for accuracy and completeness, and I agree with the above. .Brunetta GeneraMD

## 2019-02-18 ENCOUNTER — Inpatient Hospital Stay (HOSPITAL_BASED_OUTPATIENT_CLINIC_OR_DEPARTMENT_OTHER): Payer: Medicare HMO | Admitting: Hematology

## 2019-02-18 ENCOUNTER — Inpatient Hospital Stay: Payer: Medicare HMO

## 2019-02-18 ENCOUNTER — Other Ambulatory Visit: Payer: Medicare HMO

## 2019-02-18 ENCOUNTER — Other Ambulatory Visit: Payer: Self-pay

## 2019-02-18 VITALS — BP 128/84 | HR 66 | Temp 98.3°F | Resp 16

## 2019-02-18 VITALS — BP 116/59 | HR 73 | Temp 98.4°F | Resp 18 | Ht 71.0 in | Wt 180.1 lb

## 2019-02-18 DIAGNOSIS — Q9389 Other deletions from the autosomes: Secondary | ICD-10-CM

## 2019-02-18 DIAGNOSIS — E785 Hyperlipidemia, unspecified: Secondary | ICD-10-CM | POA: Diagnosis not present

## 2019-02-18 DIAGNOSIS — I700 Atherosclerosis of aorta: Secondary | ICD-10-CM | POA: Diagnosis not present

## 2019-02-18 DIAGNOSIS — C9110 Chronic lymphocytic leukemia of B-cell type not having achieved remission: Secondary | ICD-10-CM | POA: Diagnosis not present

## 2019-02-18 DIAGNOSIS — K5730 Diverticulosis of large intestine without perforation or abscess without bleeding: Secondary | ICD-10-CM | POA: Diagnosis not present

## 2019-02-18 DIAGNOSIS — I10 Essential (primary) hypertension: Secondary | ICD-10-CM | POA: Diagnosis not present

## 2019-02-18 DIAGNOSIS — Z9189 Other specified personal risk factors, not elsewhere classified: Secondary | ICD-10-CM

## 2019-02-18 DIAGNOSIS — Z7189 Other specified counseling: Secondary | ICD-10-CM

## 2019-02-18 DIAGNOSIS — Z5111 Encounter for antineoplastic chemotherapy: Secondary | ICD-10-CM | POA: Diagnosis not present

## 2019-02-18 DIAGNOSIS — Z79899 Other long term (current) drug therapy: Secondary | ICD-10-CM | POA: Diagnosis not present

## 2019-02-18 DIAGNOSIS — J439 Emphysema, unspecified: Secondary | ICD-10-CM | POA: Diagnosis not present

## 2019-02-18 DIAGNOSIS — R61 Generalized hyperhidrosis: Secondary | ICD-10-CM | POA: Diagnosis not present

## 2019-02-18 DIAGNOSIS — K219 Gastro-esophageal reflux disease without esophagitis: Secondary | ICD-10-CM | POA: Diagnosis not present

## 2019-02-18 DIAGNOSIS — Z5112 Encounter for antineoplastic immunotherapy: Secondary | ICD-10-CM

## 2019-02-18 LAB — CMP (CANCER CENTER ONLY)
ALT: 15 U/L (ref 0–44)
AST: 14 U/L — ABNORMAL LOW (ref 15–41)
Albumin: 3.6 g/dL (ref 3.5–5.0)
Alkaline Phosphatase: 64 U/L (ref 38–126)
Anion gap: 10 (ref 5–15)
BUN: 14 mg/dL (ref 8–23)
CO2: 25 mmol/L (ref 22–32)
Calcium: 9.3 mg/dL (ref 8.9–10.3)
Chloride: 105 mmol/L (ref 98–111)
Creatinine: 1.15 mg/dL (ref 0.61–1.24)
GFR, Est AFR Am: >60 mL/min (ref >60)
GFR, Estimated: >60 mL/min (ref >60)
Glucose, Bld: 112 mg/dL — ABNORMAL HIGH (ref 70–99)
Potassium: 3.6 mmol/L (ref 3.5–5.1)
Sodium: 140 mmol/L (ref 135–145)
Total Bilirubin: 0.5 mg/dL (ref 0.3–1.2)
Total Protein: 6.7 g/dL (ref 6.5–8.1)

## 2019-02-18 LAB — CBC WITH DIFFERENTIAL/PLATELET
Abs Immature Granulocytes: 0 10*3/uL (ref 0.00–0.07)
Basophils Absolute: 0 10*3/uL (ref 0.0–0.1)
Basophils Relative: 0 %
Eosinophils Absolute: 0 10*3/uL (ref 0.0–0.5)
Eosinophils Relative: 0 %
HCT: 39 % (ref 39.0–52.0)
Hemoglobin: 12.8 g/dL — ABNORMAL LOW (ref 13.0–17.0)
Immature Granulocytes: 0 %
Lymphocytes Relative: 31 %
Lymphs Abs: 1.1 10*3/uL (ref 0.7–4.0)
MCH: 30 pg (ref 26.0–34.0)
MCHC: 32.8 g/dL (ref 30.0–36.0)
MCV: 91.5 fL (ref 80.0–100.0)
Monocytes Absolute: 0.3 10*3/uL (ref 0.1–1.0)
Monocytes Relative: 9 %
Neutro Abs: 2 10*3/uL (ref 1.7–7.7)
Neutrophils Relative %: 60 %
Platelets: 212 10*3/uL (ref 150–400)
RBC: 4.26 MIL/uL (ref 4.22–5.81)
RDW: 13.2 % (ref 11.5–15.5)
WBC: 3.4 10*3/uL — ABNORMAL LOW (ref 4.0–10.5)
nRBC: 0 % (ref 0.0–0.2)

## 2019-02-18 LAB — URIC ACID: Uric Acid, Serum: 4.2 mg/dL (ref 3.7–8.6)

## 2019-02-18 MED ORDER — SODIUM CHLORIDE 0.9 % IV SOLN
1000.0000 mg | Freq: Once | INTRAVENOUS | Status: AC
  Administered 2019-02-18: 13:00:00 1000 mg via INTRAVENOUS
  Filled 2019-02-18: qty 40

## 2019-02-18 MED ORDER — DIPHENHYDRAMINE HCL 50 MG/ML IJ SOLN
50.0000 mg | Freq: Once | INTRAMUSCULAR | Status: AC
  Administered 2019-02-18: 11:00:00 50 mg via INTRAVENOUS

## 2019-02-18 MED ORDER — FAMOTIDINE IN NACL 20-0.9 MG/50ML-% IV SOLN
20.0000 mg | Freq: Once | INTRAVENOUS | Status: AC
  Administered 2019-02-18: 11:00:00 20 mg via INTRAVENOUS

## 2019-02-18 MED ORDER — SODIUM CHLORIDE 0.9 % IV SOLN
20.0000 mg | Freq: Once | INTRAVENOUS | Status: AC
  Administered 2019-02-18: 12:00:00 20 mg via INTRAVENOUS
  Filled 2019-02-18: qty 2

## 2019-02-18 MED ORDER — SODIUM CHLORIDE 0.9 % IV SOLN
Freq: Once | INTRAVENOUS | Status: AC
  Administered 2019-02-18: 11:00:00 via INTRAVENOUS
  Filled 2019-02-18: qty 250

## 2019-02-18 MED ORDER — FAMOTIDINE IN NACL 20-0.9 MG/50ML-% IV SOLN
INTRAVENOUS | Status: AC
  Filled 2019-02-18: qty 50

## 2019-02-18 MED ORDER — DIPHENHYDRAMINE HCL 50 MG/ML IJ SOLN
INTRAMUSCULAR | Status: AC
  Filled 2019-02-18: qty 1

## 2019-02-18 MED ORDER — ACETAMINOPHEN 325 MG PO TABS
650.0000 mg | ORAL_TABLET | Freq: Once | ORAL | Status: AC
  Administered 2019-02-18: 11:00:00 650 mg via ORAL

## 2019-02-18 MED ORDER — ACETAMINOPHEN 325 MG PO TABS
ORAL_TABLET | ORAL | Status: AC
  Filled 2019-02-18: qty 2

## 2019-02-18 NOTE — Patient Instructions (Signed)
[facility] Discharge Instructions for Patients Receiving Chemotherapy  Today you received the following chemotherapy agents: Gazyva   To help prevent nausea and vomiting after your treatment, we encourage you to take your nausea medication as directed.    If you develop nausea and vomiting that is not controlled by your nausea medication, call the clinic.   BELOW ARE SYMPTOMS THAT SHOULD BE REPORTED IMMEDIATELY:  *FEVER GREATER THAN 100.5 F  *CHILLS WITH OR WITHOUT FEVER  NAUSEA AND VOMITING THAT IS NOT CONTROLLED WITH YOUR NAUSEA MEDICATION  *UNUSUAL SHORTNESS OF BREATH  *UNUSUAL BRUISING OR BLEEDING  TENDERNESS IN MOUTH AND THROAT WITH OR WITHOUT PRESENCE OF ULCERS  *URINARY PROBLEMS  *BOWEL PROBLEMS  UNUSUAL RASH Items with * indicate a potential emergency and should be followed up as soon as possible.  Feel free to call the clinic should you have any questions or concerns. The clinic phone number is [phone].  Please show the CHEMO ALERT CARD at check-in to the Emergency Department and triage nurse.   

## 2019-02-22 ENCOUNTER — Encounter: Payer: Self-pay | Admitting: Hematology

## 2019-02-22 ENCOUNTER — Telehealth: Payer: Self-pay | Admitting: Hematology

## 2019-02-22 NOTE — Progress Notes (Signed)
Called patient's spouse [name] to introduce myself as Arboriculturist and to offer available resources.  Discussed the one-time $52 [place] and qualifications to assist with personal expenses while going through treatment. She states interested in applying. Advised her to send proof of incomes w/patient on 03/03/19 and I woulld make copies and give him the originals back. Based on verbal income given, they would qualify. She verbalized understanding.  Discussed COVID19 available assistance through [employer]. She is interested in applying. Advised her I would apply online on his behalf and a pre-paid V[employer] card of a one-time stipend of $250 would be sent out in the mail to them to use as they choose along with an approval letter. She verbalized understanding.  Also mentioned copay assistance through [employer] being available I needed. She states they would like all the help they can get.   Gave her my contact information for any additional financial questions or concerns.  Enrolled patient in [place] assistance program. Patient approved for the one-time stipend and will receive in the mail.  Patient does not appear to need copay assistance if [facility] picks up as secondary. Will meet with him on 6/42/0 at registration for [place].

## 2019-02-22 NOTE — Telephone Encounter (Signed)
Per 5/22 los, appt already scheduled.

## 2019-03-02 ENCOUNTER — Other Ambulatory Visit: Payer: Self-pay

## 2019-03-02 ENCOUNTER — Encounter: Payer: Self-pay | Admitting: Adult Health

## 2019-03-02 ENCOUNTER — Ambulatory Visit (INDEPENDENT_AMBULATORY_CARE_PROVIDER_SITE_OTHER): Payer: Medicare HMO | Admitting: Adult Health

## 2019-03-02 VITALS — BP 102/64 | HR 82 | Temp 97.7°F | Ht 71.0 in | Wt 178.0 lb

## 2019-03-02 DIAGNOSIS — E538 Deficiency of other specified B group vitamins: Secondary | ICD-10-CM | POA: Diagnosis not present

## 2019-03-02 DIAGNOSIS — D649 Anemia, unspecified: Secondary | ICD-10-CM

## 2019-03-02 DIAGNOSIS — Z8619 Personal history of other infectious and parasitic diseases: Secondary | ICD-10-CM | POA: Diagnosis not present

## 2019-03-02 DIAGNOSIS — R5381 Other malaise: Secondary | ICD-10-CM

## 2019-03-02 DIAGNOSIS — R5383 Other fatigue: Secondary | ICD-10-CM

## 2019-03-02 DIAGNOSIS — R142 Eructation: Secondary | ICD-10-CM

## 2019-03-02 DIAGNOSIS — K219 Gastro-esophageal reflux disease without esophagitis: Secondary | ICD-10-CM

## 2019-03-02 MED ORDER — SUCRALFATE 1 G PO TABS: 1.0000 g | ORAL_TABLET | Freq: Three times a day (TID) | ORAL | 1 refills | Status: DC

## 2019-03-02 MED ORDER — FAMOTIDINE 20 MG PO TABS: 20.0000 mg | ORAL_TABLET | Freq: Every day | ORAL | 1 refills | Status: DC

## 2019-03-02 NOTE — Patient Instructions (Addendum)
Cut losartan/hctz blood pressure medication in 1/2 for a few days and see how your "fatigue" "weakness" does - call me back in 1 week to let me know  Add carafate (coats stomach) before each meal and bedtime  Add famotidine (pepcid) 20 mg prior to breakfast daily  Pop head of bed up on blocks 2-3 inches   Gastroesophageal Reflux Disease, Adult Gastroesophageal reflux (GER) happens when acid from the stomach flows up into the tube that connects the mouth and the stomach (esophagus). Normally, food travels down the esophagus and stays in the stomach to be digested. With GER, food and stomach acid sometimes move back up into the esophagus. You may have a disease called gastroesophageal reflux disease (GERD) if the reflux:  Happens often.  Causes frequent or very bad symptoms.  Causes problems such as damage to the esophagus. When this happens, the esophagus becomes sore and swollen (inflamed). Over time, GERD can make small holes (ulcers) in the lining of the esophagus. What are the causes? This condition is caused by a problem with the muscle between the esophagus and the stomach. When this muscle is weak or not normal, it does not close properly to keep food and acid from coming back up from the stomach. The muscle can be weak because of:  Tobacco use.  Pregnancy.  Having a certain type of hernia (hiatal hernia).  Alcohol use.  Certain foods and drinks, such as coffee, chocolate, onions, and peppermint. What increases the risk? You are more likely to develop this condition if you:  Are overweight.  Have a disease that affects your connective tissue.  Use NSAID medicines. What are the signs or symptoms? Symptoms of this condition include:  Heartburn.  Difficult or painful swallowing.  The feeling of having a lump in the throat.  A bitter taste in the mouth.  Bad breath.  Having a lot of saliva.  Having an upset or bloated stomach.  Belching.  Chest pain.  Different conditions can cause chest pain. Make sure you see your doctor if you have chest pain.  Shortness of breath or noisy breathing (wheezing).  Ongoing (chronic) cough or a cough at night.  Wearing away of the surface of teeth (tooth enamel).  Weight loss. How is this treated? Treatment will depend on how bad your symptoms are. Your doctor may suggest:  Changes to your diet.  Medicine.  Surgery. Follow these instructions at home: Eating and drinking   Follow a diet as told by your doctor. You may need to avoid foods and drinks such as: ? Coffee and tea (with or without caffeine). ? Drinks that contain alcohol. ? Energy drinks and sports drinks. ? Bubbly (carbonated) drinks or sodas. ? Chocolate and cocoa. ? Peppermint and mint flavorings. ? Garlic and onions. ? Horseradish. ? Spicy and acidic foods. These include peppers, chili powder, curry powder, vinegar, hot sauces, and BBQ sauce. ? Citrus fruit juices and citrus fruits, such as oranges, lemons, and limes. ? Tomato-based foods. These include red sauce, chili, salsa, and pizza with red sauce. ? Fried and fatty foods. These include donuts, french fries, potato chips, and high-fat dressings. ? High-fat meats. These include hot dogs, rib eye steak, sausage, ham, and bacon. ? High-fat dairy items, such as whole milk, butter, and cream cheese.  Eat small meals often. Avoid eating large meals.  Avoid drinking large amounts of liquid with your meals.  Avoid eating meals during the 2-3 hours before bedtime.  Avoid lying down right after  you eat.  Do not exercise right after you eat. Lifestyle   Do not use any products that contain nicotine or tobacco. These include cigarettes, e-cigarettes, and chewing tobacco. If you need help quitting, ask your doctor.  Try to lower your stress. If you need help doing this, ask your doctor.  If you are overweight, lose an amount of weight that is healthy for you. Ask your  doctor about a safe weight loss goal. General instructions  Pay attention to any changes in your symptoms.  Take over-the-counter and prescription medicines only as told by your doctor. Do not take aspirin, ibuprofen, or other NSAIDs unless your doctor says it is okay.  Wear loose clothes. Do not wear anything tight around your waist.  Raise (elevate) the head of your bed about 6 inches (15 cm).  Avoid bending over if this makes your symptoms worse.  Keep all follow-up visits as told by your doctor. This is important. Contact a doctor if:  You have new symptoms.  You lose weight and you do not know why.  You have trouble swallowing or it hurts to swallow.  You have wheezing or a cough that keeps happening.  Your symptoms do not get better with treatment.  You have a hoarse voice. Get help right away if:  You have pain in your arms, neck, jaw, teeth, or back.  You feel sweaty, dizzy, or light-headed.  You have chest pain or shortness of breath.  You throw up (vomit) and your throw-up looks like blood or coffee grounds.  You pass out (faint).  Your poop (stool) is bloody or black.  You cannot swallow, drink, or eat. Summary  If a person has gastroesophageal reflux disease (GERD), food and stomach acid move back up into the esophagus and cause symptoms or problems such as damage to the esophagus.  Treatment will depend on how bad your symptoms are.  Follow a diet as told by your doctor.  Take all medicines only as told by your doctor. This information is not intended to replace advice given to you by your health care provider. Make sure you discuss any questions you have with your health care provider. Document Released: 03/03/2008 Document Revised: 03/24/2018 Document Reviewed: 03/24/2018 Elsevier Interactive Patient Education  2019 Elsevier Inc.    Sucralfate tablets What is this medicine? SUCRALFATE (SOO kral fate) helps to treat ulcers of the  intestine. This medicine may be used for other purposes; ask your health care provider or pharmacist if you have questions. COMMON BRAND NAME(S): Carafate What should I tell my health care provider before I take this medicine? They need to know if you have any of these conditions: -kidney disease -an unusual or allergic reaction to sucralfate, other medicines, foods, dyes, or preservatives -pregnant or trying to get pregnant -breast-feeding How should I use this medicine? Take this medicine by mouth with a glass of water. Follow the directions on the prescription label. This medicine works best if you take it on an empty stomach, 1 hour before meals. Take your doses at regular intervals. Do not take your medicine more often than directed. Do not stop taking except on your doctor's advice. Talk to your pediatrician regarding the use of this medicine in children. Special care may be needed. Overdosage: If you think you have taken too much of this medicine contact a poison control center or emergency room at once. NOTE: This medicine is only for you. Do not share this medicine with others. What if I  miss a dose? If you miss a dose, take it as soon as you can. If it is almost time for your next dose, take only that dose. Do not take double or extra doses. What may interact with this medicine? -antacid -cimetidine -digoxin -ketoconazole -phenytoin -quinidine -ranitidine -some antibiotics like ciprofloxacin, norfloxacin, and ofloxacin -theophylline -thyroid hormones -warfarin This list may not describe all possible interactions. Give your health care provider a list of all the medicines, herbs, non-prescription drugs, or dietary supplements you use. Also tell them if you smoke, drink alcohol, or use illegal drugs. Some items may interact with your medicine. What should I watch for while using this medicine? Visit your doctor or health care professional for regular check ups. Let your doctor  know if your symptoms do not improve or if you feel worse. Antacids should not be taken within one half hour before or after this medicine. What side effects may I notice from receiving this medicine? Side effects that you should report to your doctor or health care professional as soon as possible: -allergic reactions like skin rash, itching or hives, swelling of the face, lips, or tongue -difficulty breathing Side effects that usually do not require medical attention (report to your doctor or health care professional if they continue or are bothersome): -back pain -constipation -drowsy, dizzy -dry mouth -headache -stomach upset, gas -trouble sleeping This list may not describe all possible side effects. Call your doctor for medical advice about side effects. You may report side effects to FDA at 1-800-FDA-1088. Where should I keep my medicine? Keep out of the reach of children. Store at room temperature between 15 and 30 degrees C (59 and 86 degrees F). Keep container tightly closed. Throw away any unused medicine after the expiration date. NOTE: This sheet is a summary. It may not cover all possible information. If you have questions about this medicine, talk to your doctor, pharmacist, or health care provider.  2019 Elsevier/Gold Standard (2008-05-17 15:46:20)

## 2019-03-02 NOTE — Progress Notes (Signed)
HEMATOLOGY/ONCOLOGY CLINIC NOTE  Date of Service: 03/03/19   Patient Care Team: Unk Pinto, MD as PCP - General (Internal Medicine) Inda Castle, MD (Inactive) as Consulting Physician (Gastroenterology) Melissa Noon, Savage as Referring Physician (Optometry) Danis, Kirke Corin, MD as Consulting Physician (Gastroenterology) Brunetta Genera, MD as Consulting Physician (Hematology)  CHIEF COMPLAINTS/PURPOSE OF CONSULTATION:  F/u for CD5 neg CLL   HISTORY OF PRESENTING ILLNESS:   Javier [name] is a wonderful 78 y.o. male who has been referred to [place] by Dr .Unk Pinto, MD for evaluation and management of lymphocytosis concerning for a lymphoproliferative process.  Patient has a h/o HTN, HLD, Prediabetes who notes that he had significant abdominal discomfort with significant dyspeptic symptoms around aug 2017 and had about 20-25lbs weight loss and had an extensive GI workup including CT abd/pelvis 05/25/2016 - WNL, EGD which showed chronic gastritis (helicobacter pylori +ve) and colonscopy with some polyps. Capsule endoscopy was not approved by insurance. Patients symptoms improved after treatment of H.pylori and he notes that he has gained back about 6-7 lbs and is eating better.  Patients labs were noted to show some leucocytosis/Lymphocytosis for which he was referred to [place] for further evaluation.  Patient notes some nightsweats and weight loss (as noted above, possible alternative explanation). No fevers/chills. Has not noted any overt enlarged LN and CT abd in 04/2016 showed no hepatomegaly or splenomegaly and no abd/RP LNadenopathy.  Patient notes no other acute focal symptoms.   INTERVAL HISTORY  [name] is here for management and evaluation of his CLL. The patient's last visit with [place] was on 02/18/19. The pt reports that he is doing well overall.  The pt reports that he has continued tolerating 470m Venetoclax well overall. He notes that he is "eating too  much," and notes that he has some occasional mild fatigue which does not interrupt his daily activities. He denies abdominal pains, chest pains, or shortness of breath.  Lab results today (03/03/19) of CBC w/diff and CMP is as follows: all values are WNL except for WBC at 3.7k, RBC at 4.11, HGB at 12.2, HCT at 38.5, PLT at 141k. 03/03/19 Uric acid is 4.9  On review of systems, pt reports stable energy levels overall, eating well, stable weight, and denies abdominal pains, chest pains, shortness of breath, concerns for infections, noticing any new lumps or bumps, leg swelling, and any other symptoms.    MEDICAL HISTORY:  Past Medical History:  Diagnosis Date  . Diverticulosis   . Hyperlipidemia   . Hypertension   . Prediabetes   . Vitamin D deficiency   Helicobacter pylori +ve   SURGICAL HISTORY: Past Surgical History:  Procedure Laterality Date  . NO PAST SURGERIES      SOCIAL HISTORY: Social History   Socioeconomic History  . Marital status: Married    Spouse name: Not on file  . Number of children: 5  . Years of education: Not on file  . Highest education level: Not on file  Occupational History  . Occupation: retired  SScientific laboratory technician . Financial resource strain: Not on file  . Food insecurity:    Worry: Not on file    Inability: Not on file  . Transportation needs:    Medical: Not on file    Non-medical: Not on file  Tobacco Use  . Smoking status: Former Smoker    Types: Cigarettes    Last attempt to quit: 01/17/2003    Years since quitting: 16.1  .  Smokeless tobacco: Never Used  Substance and Sexual Activity  . Alcohol use: Yes    Comment: very rare  . Drug use: No  . Sexual activity: Not on file  Lifestyle  . Physical activity:    Days per week: Not on file    Minutes per session: Not on file  . Stress: Not on file  Relationships  . Social connections:    Talks on phone: Not on file    Gets together: Not on file    Attends religious service: Not on file     Active member of club or organization: Not on file    Attends meetings of clubs or organizations: Not on file    Relationship status: Not on file  . Intimate partner violence:    Fear of current or ex partner: Not on file    Emotionally abused: Not on file    Physically abused: Not on file    Forced sexual activity: Not on file  Other Topics Concern  . Not on file  Social History Narrative  . Not on file  Quit smoking >10 yrs ago, previously 1.5Packs per week. Started smoking in his teens. Retired. Worked as a Chief Financial Officer.   FAMILY HISTORY: Family History  Problem Relation Age of Onset  . Stroke Mother   . Early death Father        Farm/tractor accident  . Diabetes Brother   . Cirrhosis Brother   . Colon cancer Paternal Grandfather     ALLERGIES:  is allergic to penicillins and ppd [tuberculin purified protein derivative].  MEDICATIONS:  Current Outpatient Medications  Medication Sig Dispense Refill  . ALPRAZolam (XANAX) 0.5 MG tablet Take 1/2-1 tablet 2 - 3 x /day ONLY if needed for Anxiety Attack &  limit to 5 days /week to avoid addiction 90 tablet 0  . Alum & Mag Hydroxide-Simeth (MYLANTA PO) Take by mouth as needed.    Marland Kitchen amLODipine (NORVASC) 2.5 MG tablet One pill at night for blood pressure 90 tablet 2  . aspirin EC 81 MG tablet Take 81 mg by mouth daily.    . Cholecalciferol (VITAMIN D3) 5000 units TABS Take 5,000 Units by mouth daily.    . famotidine (PEPCID) 20 MG tablet Take 1 tablet (20 mg total) by mouth daily before breakfast. 30 tablet 1  . losartan-hydrochlorothiazide (HYZAAR) 100-25 MG tablet Take 1 tablet Daily for BP & Fluid 90 tablet 1  . omeprazole (PRILOSEC) 40 MG capsule Take 1 capsule (40 mg total) by mouth daily. 90 capsule 1  . pravastatin (PRAVACHOL) 40 MG tablet TAKE 1 TABLET AT BEDTIME  FOR  CHOLESTEROL (Patient taking differently: Take 40 mg by mouth at bedtime. ) 90 tablet 3  . sucralfate (CARAFATE) 1 g tablet Take 1 tablet (1 g total) by  mouth 4 (four) times daily -  with meals and at bedtime. 120 tablet 1  . triamcinolone (NASACORT) 55 MCG/ACT AERO nasal inhaler Place 2 sprays into the nose at bedtime. 1 Inhaler 3  . venetoclax 100 MG TABS Take 400 mg by mouth daily. Take with food and water 120 tablet 2   No current facility-administered medications for this visit.     REVIEW OF SYSTEMS:    A 10+ POINT REVIEW OF SYSTEMS WAS OBTAINED including neurology, dermatology, psychiatry, cardiac, respiratory, lymph, extremities, GI, GU, Musculoskeletal, constitutional, breasts, reproductive, HEENT.  All pertinent positives are noted in the HPI.  All others are negative.   PHYSICAL EXAMINATION: ECOG PERFORMANCE STATUS:  1 - Symptomatic but completely ambulatory  Vitals:   03/03/19 0840  BP: 126/60  Pulse: 72  Resp: 17  Temp: 98.4 F (36.9 C)  SpO2: 100%   Filed Weights   03/03/19 0840  Weight: 178 lb 11.2 oz (81.1 kg)   .Body mass index is 24.92 kg/m.  GENERAL:alert, in no acute distress and comfortable SKIN: no acute rashes, no significant lesions EYES: conjunctiva are pink and non-injected, sclera anicteric OROPHARYNX: MMM, no exudates, no oropharyngeal erythema or ulceration NECK: supple, no JVD LYMPH:  no palpable lymphadenopathy in the cervical, axillary or inguinal regions LUNGS: clear to auscultation b/l with normal respiratory effort HEART: regular rate & rhythm ABDOMEN:  normoactive bowel sounds , non tender, not distended. No palpable hepatosplenomegaly.  Extremity: no pedal edema PSYCH: alert & oriented x 3 with fluent speech NEURO: no focal motor/sensory deficits   LABORATORY DATA:  I have reviewed the data as listed  . CBC Latest Ref Rng & Units 03/03/2019 02/18/2019 02/10/2019  WBC 4.0 - 10.5 K/uL 3.7(L) 3.4(L) 3.5(L)  Hemoglobin 13.0 - 17.0 g/dL 12.2(L) 12.8(L) 13.1  Hematocrit 39.0 - 52.0 % 38.5(L) 39.0 40.3  Platelets 150 - 400 K/uL 141(L) 212 104(L)   . CBC    Component Value Date/Time    WBC 3.7 (L) 03/03/2019 0824   RBC 4.11 (L) 03/03/2019 0824   HGB 12.2 (L) 03/03/2019 0824   HGB 12.2 (L) 01/03/2019 1509   HGB 14.5 10/21/2016 1324   HCT 38.5 (L) 03/03/2019 0824   HCT 43.8 10/21/2016 1324   PLT 141 (L) 03/03/2019 0824   PLT 152 01/03/2019 1509   PLT 157 10/21/2016 1324   MCV 93.7 03/03/2019 0824   MCV 90.9 10/21/2016 1324   MCH 29.7 03/03/2019 0824   MCHC 31.7 03/03/2019 0824   RDW 13.6 03/03/2019 0824   RDW 13.3 10/21/2016 1324   LYMPHSABS 1.2 03/03/2019 0824   LYMPHSABS 17.6 (H) 10/21/2016 1324   MONOABS 0.5 03/03/2019 0824   MONOABS 0.6 10/21/2016 1324   EOSABS 0.0 03/03/2019 0824   EOSABS 0.0 10/21/2016 1324   BASOSABS 0.0 03/03/2019 0824   BASOSABS 0.1 10/21/2016 1324     . CMP Latest Ref Rng & Units 03/03/2019 02/18/2019 02/10/2019  Glucose 70 - 99 mg/dL 114(H) 112(H) 104(H)  BUN 8 - 23 mg/dL '14 14 19  ' Creatinine 0.61 - 1.24 mg/dL 1.23 1.15 1.20  Sodium 135 - 145 mmol/L 141 140 140  Potassium 3.5 - 5.1 mmol/L 3.7 3.6 3.4(L)  Chloride 98 - 111 mmol/L 109 105 106  CO2 22 - 32 mmol/L '23 25 24  ' Calcium 8.9 - 10.3 mg/dL 9.2 9.3 9.2  Total Protein 6.5 - 8.1 g/dL 6.2(L) 6.7 6.5  Total Bilirubin 0.3 - 1.2 mg/dL 0.4 0.5 0.6  Alkaline Phos 38 - 126 U/L 59 64 71  AST 15 - 41 U/L 12(L) 14(L) 16  ALT 0 - 44 U/L '9 15 22   ' . Lab Results  Component Value Date   LDH 172 02/03/2019         RADIOGRAPHIC STUDIES: I have personally reviewed the radiological images as listed and agreed with the findings in the report. No results found.  ASSESSMENT & PLAN:   78 y.o. 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 FISH panel showed p53 (17p13) deletion that would be consistent with this diagnosis . It often represents also be more aggressive form of CLL Differential diagnosis includes - CD5  neg CLL vs splenic lymphoma vs Marginal Zone lymphoma vs LPL (lymphoplasmacytic lymphoma) LDH WNL suggests against a high  grade process. Currently no anemia or thrombocytopenia noted. Now with drenching night sweats and fatigue, no fevers/chills.  10/31/16 PET/CT scan shows some small hypermetabolic nodes in the thorax including bilateral hilar paratracheal) esophageal. These would be difficult to biopsy.  Lost to follow up between 11/06/16 and 11/19/18  12/01/18 PET/CT revealed Stable scattered small hypermetabolic thoracic lymph nodes, once again at [place] 5 activity level. 2. No findings of involvement in the neck, abdomen/pelvis, or skeleton. 3. Prostatomegaly. 4. Aortic Atherosclerosis and Emphysema 5. Sigmoid colon diverticulosis  PLAN -Discussed pt labwork today, 03/03/19; blood counts are stable  -The pt has no prohibitive toxicities from continuing [place] and 466m Venetoclax at this time. -Plan for total of 6 months of Gazyva -Pt has been able to consume at least 48-60 oz of water each day, and stressed that he should continue to do this, and we will hold IV fluids in light of this -Advised raising the head of his bed and not eating food within 2 hours of ideal bed time -Recommend following up with PCP for prostatomegaly management and recommendations -Recommended that the pt continue to eat well, drink at least 48-64 oz of water each day, and walk 20-30 minutes each day. -Will see the pt back in 4 weeks with C3D1   2) . Patient Active Problem List   Diagnosis Date Noted  . Counseling regarding advance care planning and goals of care 01/24/2019  . Anxiety 02/08/2018  . CLL (chronic lymphocytic leukemia) ([facility] 11/01/2016  . Encounter for Medicare annual wellness exam 09/11/2015  . GERD  02/09/2015  . Medication management 11/02/2013  . Hyperlipidemia   . Hypertension   . Abnormal glucose   . Vitamin D deficiency   . Diverticulosis    -f/u with PCP for management of other medical co-morbids  3) H.pylori gastritis -f/u with GI to ensure eradication of h.pylori post treatment.   Please  schedule next 2 cycles of monthly Gazyva as ordered with labs and MD visit   All of the patients questions were answered with apparent satisfaction. The patient knows to call the clinic with any problems, questions or concerns.  The total time spent in the appt was 25 minutes and more than 50% was on counseling and direct patient cares.    GSullivan LoneMD MS AAHIVMS SRipon Medical CenterCWinner Regional Healthcare CenterHematology/Oncology Physician [name] (Office):       [phone](Work cell):  [phone](Fax):           [phone] 03/03/2019 9:25 AM  I, SBaldwin Jamaica am acting as a scribe for Dr. GSullivan Lone   .I have reviewed the above documentation for accuracy and completeness, and I agree with the above. .Brunetta GeneraMD

## 2019-03-02 NOTE — Progress Notes (Signed)
Assessment and Plan:  Javier [name] was seen today for fatigue and gi problem.  Diagnoses and all orders for this visit:  Malaise and fatigue/Anemia, unspecified type Ongoing for nearly a year; has had multiple CBC, CMP, UA, TSH in that time Does have baseline anemia; has never had B12/iron checked - will do this today Supplement as indicated BP somewhat low today; he will try cutting hyzaar in 1/2 for 1 week; call back to report BPs and effect on fatigue Follow up if persistent after GI symptoms are improved -     Vitamin B12 -     Iron,Total/Total Iron Binding Cap  Belching/GERD/epigastric tenderness/History of Helicobacter pylori infection Complex GI hx with H. Pylori in 2017, did treatment but with complicated recovery, was referred on to [facility] but unclear whether h. Pylori was rechecked Continue with omeprazole daily at HS; add famotidine 20 mg daily in AM Carafate discussed and sent in  He likely needs repeat EGD to r/o h. Pylori due to PPI use; requests referral back to local GI  Patient and wife in agreement with plan Please go to the ER if you have any severe AB pain, unable to hold down food/water, blood in stool or vomit, chest pain, shortness of breath, or any worsening symptoms.  -     Ambulatory referral to Gastroenterology -     sucralfate (CARAFATE) 1 g tablet; Take 1 tablet (1 g total) by mouth 4 (four) times daily -  with meals and at bedtime. -     famotidine (PEPCID) 20 MG tablet; Take 1 tablet (20 mg total) by mouth daily before breakfast.  Further disposition pending results of labs. Discussed med's effects and SE's.   Over 30 minutes of exam, counseling, chart review, and critical decision making was performed.   Future Appointments  Date Time Provider [place]  03/03/2019  8:00 AM [facility] LAB 6 [facility] None  03/03/2019  8:40 AM Brunetta Genera, MD [facility] None  03/03/2019  9:30 AM [facility] INFUSION [facility] None  03/31/2019  8:00 AM  [facility] LAB 5 [facility] None  03/31/2019  8:40 AM Brunetta Genera, MD [facility] None  03/31/2019  9:00 AM [facility] INFUSION [facility] None  04/25/2019 10:45 AM Vicie Mutters, PA-C [facility] None    ------------------------------------------------------------------------------------------------------------------   HPI BP 102/64   Pulse 82   Temp 97.7 F (36.5 C)   Ht 5\' 11"  (1.803 m)   Wt 178 lb (80.7 kg)   SpO2 97%   BMI 24.83 kg/m   78 y.o.male, AA with hx of diverticulosis, GERD, CLL, htn, hyperlipidemia presents for evaluation for "weakness" and persistent belching/burping (per patient wife was advised by Dr. Irene Limbo CLL in remission, to evaluate with PCP/GI).   He is very vague in description of weakness; ? AM fatigue, denies dizziness,   Reports symptoms are persistent but have been worse than usual for the past year, worst at night, daytime symptoms are mild excepting in the AM upon awakening, has he reports epigastric burning, belching/burping. Denies pain. Does endorse mild bloating sensation. Denies n/v/d, has BM once daily. Reports normal stool color, denies blood on stools or tarry stools. He has been taking omeprazole 40 mg daily prior to bedtime, has been supplementing with mylanta which helps but doesn't control symptoms.   Colonoscopy 2015 by Dr. Deatra Ina with diverticulosis, multiple polyps, was recommended 3 year follow up but no record this was done  He had EGD in 2017, + h. Pylori and underwent abx treatment for this, was followed  by Dr. [name], had complicated recovery and was apparently later evaluated by [facility], but he reports never had h. Pylori rechecked. He was apparently released from Dr. Corena Pilgrim practice   Denies ETOH, NSAID use.   Lab Results  Component Value Date   WBC 3.4 (L) 02/18/2019   HGB 12.8 (L) 02/18/2019   HCT 39.0 02/18/2019   MCV 91.5 02/18/2019   PLT 212 02/18/2019   No results found for: VITAMINB12  No results found for:  IRON, TIBC, FERRITIN   Past Medical History:  Diagnosis Date  . Diverticulosis   . Hyperlipidemia   . Hypertension   . Prediabetes   . Vitamin D deficiency      Allergies  Allergen Reactions  . Penicillins Other (See Comments)    Unknown allergic reaction as a teenager Did it involve swelling of the face/tongue/throat, SOB, or low BP? Unknown Did it involve sudden or severe rash/hives, skin peeling, or any reaction on the inside of your mouth or nose? Unknown Did you need to seek medical attention at a hospital or doctor's office? Unknown When did it last happen?Teenager If all above answers are "NO", may proceed with cephalosporin use.   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 ALPRAZolam (XANAX) 0.5 MG tablet Take 1/2-1 tablet 2 - 3 x /day ONLY if needed for Anxiety Attack &  limit to 5 days /week to avoid addiction  . amLODipine (NORVASC) 2.5 MG tablet One pill at night for blood pressure  . aspirin EC 81 MG tablet Take 81 mg by mouth daily.  . Cholecalciferol (VITAMIN D3) 5000 units TABS Take 5,000 Units by mouth daily.  Marland Kitchen losartan-hydrochlorothiazide (HYZAAR) 100-25 MG tablet Take 1 tablet Daily for BP & Fluid  . omeprazole (PRILOSEC) 40 MG capsule Take 1 capsule (40 mg total) by mouth daily.  . pravastatin (PRAVACHOL) 40 MG tablet TAKE 1 TABLET AT BEDTIME  FOR  CHOLESTEROL (Patient taking differently: Take 40 mg by mouth at bedtime. )  . triamcinolone (NASACORT) 55 MCG/ACT AERO nasal inhaler Place 2 sprays into the nose at bedtime.  Marland Kitchen venetoclax 100 MG TABS Take 400 mg by mouth daily. Take with food and water   No current facility-administered medications on file prior to visit.     ROS: Review of Systems  Constitutional: Positive for malaise/fatigue. Negative for chills, diaphoresis, fever and weight loss.  HENT: Negative.   Eyes: Negative.   Respiratory:  Negative for cough, sputum production, shortness of breath and wheezing.   Cardiovascular: Negative for chest pain, palpitations, orthopnea, leg swelling and PND.  Gastrointestinal: Positive for heartburn. Negative for abdominal pain, blood in stool, constipation, diarrhea, melena, nausea and vomiting.       Belching, burping; occasional bloating  Genitourinary: Negative.   Musculoskeletal: Negative for back pain, joint pain and myalgias.  Skin: Negative for rash.  Neurological: Negative for dizziness, tingling, sensory change, focal weakness, weakness and headaches.  Endo/Heme/Allergies: Negative for polydipsia. Does not bruise/bleed easily.  Psychiatric/Behavioral: Negative for depression, substance abuse and suicidal ideas. The patient is not nervous/anxious and does not have insomnia.      Physical Exam:  Ht 5\' 11"  (1.803 m)   Wt 178 lb (80.7 kg)   BMI 24.83 kg/m   General Appearance: Well nourished, in no apparent distress. Eyes: PERRLA, conjunctiva no swelling or erythema ENT/Mouth:  No erythema, swelling, or exudate  on post pharynx.  Tonsils not swollen or erythematous. Hearing normal.  Neck: Supple, thyroid normal.  Respiratory: Respiratory effort normal, BS equal bilaterally without rales, rhonchi, wheezing or stridor.  Cardio: RRR with no MRGs. Brisk peripheral pulses without edema.  Abdomen: Soft, + BS.  + epigastric tenderness, no guarding, rebound, hernias, palpable masses. Lymphatics: Non tender without lymphadenopathy.  Musculoskeletal: No obvious deformity, Symmetrical strength, normal gait.  Skin: Warm, dry without rashes, lesions, ecchymosis.  Neuro: Normal muscle tone,  Psych: Awake and oriented X 3, normal affect, Insight and Judgment appropriate.     Izora Ribas, NP 10:07 AM [facility]

## 2019-03-03 ENCOUNTER — Telehealth: Payer: Self-pay | Admitting: Hematology

## 2019-03-03 ENCOUNTER — Telehealth: Payer: Self-pay | Admitting: Internal Medicine

## 2019-03-03 ENCOUNTER — Inpatient Hospital Stay (HOSPITAL_BASED_OUTPATIENT_CLINIC_OR_DEPARTMENT_OTHER): Payer: Medicare HMO | Admitting: Hematology

## 2019-03-03 ENCOUNTER — Other Ambulatory Visit: Payer: Self-pay

## 2019-03-03 ENCOUNTER — Inpatient Hospital Stay: Payer: Medicare HMO | Attending: Hematology

## 2019-03-03 ENCOUNTER — Inpatient Hospital Stay: Payer: Medicare HMO

## 2019-03-03 VITALS — BP 151/74 | HR 60 | Temp 97.9°F | Resp 16

## 2019-03-03 VITALS — BP 126/60 | HR 72 | Temp 98.4°F | Resp 17 | Ht 71.0 in | Wt 178.7 lb

## 2019-03-03 DIAGNOSIS — Z7189 Other specified counseling: Secondary | ICD-10-CM

## 2019-03-03 DIAGNOSIS — Z5112 Encounter for antineoplastic immunotherapy: Secondary | ICD-10-CM

## 2019-03-03 DIAGNOSIS — Q9389 Other deletions from the autosomes: Secondary | ICD-10-CM

## 2019-03-03 DIAGNOSIS — Z87891 Personal history of nicotine dependence: Secondary | ICD-10-CM | POA: Insufficient documentation

## 2019-03-03 DIAGNOSIS — Z7982 Long term (current) use of aspirin: Secondary | ICD-10-CM | POA: Diagnosis not present

## 2019-03-03 DIAGNOSIS — C9110 Chronic lymphocytic leukemia of B-cell type not having achieved remission: Secondary | ICD-10-CM

## 2019-03-03 DIAGNOSIS — I10 Essential (primary) hypertension: Secondary | ICD-10-CM | POA: Diagnosis not present

## 2019-03-03 DIAGNOSIS — Z79899 Other long term (current) drug therapy: Secondary | ICD-10-CM | POA: Insufficient documentation

## 2019-03-03 LAB — CBC WITH DIFFERENTIAL/PLATELET
Abs Immature Granulocytes: 0.01 10*3/uL (ref 0.00–0.07)
Basophils Absolute: 0 10*3/uL (ref 0.0–0.1)
Basophils Relative: 0 %
Eosinophils Absolute: 0 10*3/uL (ref 0.0–0.5)
Eosinophils Relative: 0 %
HCT: 38.5 % — ABNORMAL LOW (ref 39.0–52.0)
Hemoglobin: 12.2 g/dL — ABNORMAL LOW (ref 13.0–17.0)
Immature Granulocytes: 0 %
Lymphocytes Relative: 31 %
Lymphs Abs: 1.2 10*3/uL (ref 0.7–4.0)
MCH: 29.7 pg (ref 26.0–34.0)
MCHC: 31.7 g/dL (ref 30.0–36.0)
MCV: 93.7 fL (ref 80.0–100.0)
Monocytes Absolute: 0.5 10*3/uL (ref 0.1–1.0)
Monocytes Relative: 13 %
Neutro Abs: 2.1 10*3/uL (ref 1.7–7.7)
Neutrophils Relative %: 56 %
Platelets: 141 10*3/uL — ABNORMAL LOW (ref 150–400)
RBC: 4.11 MIL/uL — ABNORMAL LOW (ref 4.22–5.81)
RDW: 13.6 % (ref 11.5–15.5)
WBC: 3.7 10*3/uL — ABNORMAL LOW (ref 4.0–10.5)
nRBC: 0 % (ref 0.0–0.2)

## 2019-03-03 LAB — CMP (CANCER CENTER ONLY)
ALT: 9 U/L (ref 0–44)
AST: 12 U/L — ABNORMAL LOW (ref 15–41)
Albumin: 3.4 g/dL — ABNORMAL LOW (ref 3.5–5.0)
Alkaline Phosphatase: 59 U/L (ref 38–126)
Anion gap: 9 (ref 5–15)
BUN: 14 mg/dL (ref 8–23)
CO2: 23 mmol/L (ref 22–32)
Calcium: 9.2 mg/dL (ref 8.9–10.3)
Chloride: 109 mmol/L (ref 98–111)
Creatinine: 1.23 mg/dL (ref 0.61–1.24)
GFR, Est AFR Am: >60 mL/min (ref >60)
GFR, Estimated: 56 mL/min — ABNORMAL LOW (ref >60)
Glucose, Bld: 114 mg/dL — ABNORMAL HIGH (ref 70–99)
Potassium: 3.7 mmol/L (ref 3.5–5.1)
Sodium: 141 mmol/L (ref 135–145)
Total Bilirubin: 0.4 mg/dL (ref 0.3–1.2)
Total Protein: 6.2 g/dL — ABNORMAL LOW (ref 6.5–8.1)

## 2019-03-03 LAB — VITAMIN B12: Vitamin B-12: 315 pg/mL (ref 200–1100)

## 2019-03-03 LAB — IRON, TOTAL/TOTAL IRON BINDING CAP
%SAT: 30 % (calc) (ref 20–48)
Iron: 96 ug/dL (ref 50–180)
TIBC: 318 mcg/dL (calc) (ref 250–425)

## 2019-03-03 LAB — URIC ACID: Uric Acid, Serum: 4.9 mg/dL (ref 3.7–8.6)

## 2019-03-03 MED ORDER — SODIUM CHLORIDE 0.9 % IV SOLN
1000.0000 mg | Freq: Once | INTRAVENOUS | Status: AC
  Administered 2019-03-03: 1000 mg via INTRAVENOUS
  Filled 2019-03-03: qty 40

## 2019-03-03 MED ORDER — FAMOTIDINE IN NACL 20-0.9 MG/50ML-% IV SOLN
INTRAVENOUS | Status: AC
  Filled 2019-03-03: qty 50

## 2019-03-03 MED ORDER — DIPHENHYDRAMINE HCL 50 MG/ML IJ SOLN
50.0000 mg | Freq: Once | INTRAMUSCULAR | Status: AC
  Administered 2019-03-03: 50 mg via INTRAVENOUS

## 2019-03-03 MED ORDER — DIPHENHYDRAMINE HCL 50 MG/ML IJ SOLN
INTRAMUSCULAR | Status: AC
  Filled 2019-03-03: qty 1

## 2019-03-03 MED ORDER — ACETAMINOPHEN 325 MG PO TABS
ORAL_TABLET | ORAL | Status: AC
  Filled 2019-03-03: qty 2

## 2019-03-03 MED ORDER — SODIUM CHLORIDE 0.9 % IV SOLN
20.0000 mg | Freq: Once | INTRAVENOUS | Status: AC
  Administered 2019-03-03: 11:00:00 20 mg via INTRAVENOUS
  Filled 2019-03-03: qty 20

## 2019-03-03 MED ORDER — FAMOTIDINE IN NACL 20-0.9 MG/50ML-% IV SOLN
20.0000 mg | Freq: Once | INTRAVENOUS | Status: AC
  Administered 2019-03-03: 20 mg via INTRAVENOUS

## 2019-03-03 MED ORDER — SODIUM CHLORIDE 0.9 % IV SOLN
Freq: Once | INTRAVENOUS | Status: AC
  Administered 2019-03-03: 10:00:00 via INTRAVENOUS
  Filled 2019-03-03: qty 250

## 2019-03-03 MED ORDER — ACETAMINOPHEN 325 MG PO TABS
650.0000 mg | ORAL_TABLET | Freq: Once | ORAL | Status: AC
  Administered 2019-03-03: 650 mg via ORAL

## 2019-03-03 NOTE — Telephone Encounter (Signed)
Scheduled appt per 6/4 los. 

## 2019-03-03 NOTE — Patient Instructions (Signed)
[place] Discharge Instructions for Patients Receiving Chemotherapy  Today you received the following Immunotherapy: Gazyva  To help prevent nausea and vomiting after your treatment, we encourage you to take your nausea medication as directed by your MD   If you develop nausea and vomiting that is not controlled by your nausea medication, call the clinic.   BELOW ARE SYMPTOMS THAT SHOULD BE REPORTED IMMEDIATELY:  *FEVER GREATER THAN 100.5 F  *CHILLS WITH OR WITHOUT FEVER  NAUSEA AND VOMITING THAT IS NOT CONTROLLED WITH YOUR NAUSEA MEDICATION  *UNUSUAL SHORTNESS OF BREATH  *UNUSUAL BRUISING OR BLEEDING  TENDERNESS IN MOUTH AND THROAT WITH OR WITHOUT PRESENCE OF ULCERS  *URINARY PROBLEMS  *BOWEL PROBLEMS  UNUSUAL RASH Items with * indicate a potential emergency and should be followed up as soon as possible.  Feel free to call the clinic should you have any questions or concerns. The clinic phone number is [phone].  Please show the [place] at check-in to the Emergency Department and triage nurse.  Coronavirus (COVID-19) Are you at risk?  Are you at risk for the Coronavirus (COVID-19)?  To be considered HIGH RISK for Coronavirus (COVID-19), you have to meet the following criteria:  . Traveled to [place], [place], [place], [place] or [place]; or in the [place] to [place], [place], [place], or [place]; and have fever, cough, and shortness of breath within the last 2 weeks of travel OR . Been in close contact with a person diagnosed with COVID-19 within the last 2 weeks and have fever, cough, and shortness of breath . IF YOU DO NOT MEET THESE CRITERIA, YOU ARE CONSIDERED LOW RISK FOR COVID-19.  What to do if you are HIGH RISK for COVID-19?  Marland Kitchen If you are having a medical emergency, call 911. . Seek medical care right away. Before you go to a doctor's office, urgent care or emergency department, call ahead and tell them about your  recent travel, contact with someone diagnosed with COVID-19, and your symptoms. You should receive instructions from your physician's office regarding next steps of care.  . When you arrive at healthcare provider, tell the healthcare staff immediately you have returned from visiting [place], [place], [place], [place] or [place]; or traveled in the [place] to [place], [place], [place], or [place]; in the last two weeks or you have been in close contact with a person diagnosed with COVID-19 in the last 2 weeks.   . Tell the health care staff about your symptoms: fever, cough and shortness of breath. . After you have been seen by a medical provider, you will be either: o Tested for (COVID-19) and discharged home on quarantine except to seek medical care if symptoms worsen, and asked to  - Stay home and avoid contact with others until you get your results (4-5 days)  - Avoid travel on public transportation if possible (such as bus, train, or airplane) or o Sent to the Emergency Department by EMS for evaluation, COVID-19 testing, and possible admission depending on your condition and test results.  What to do if you are LOW RISK for COVID-19?  Reduce your risk of any infection by using the same precautions used for avoiding the common cold or flu:  Marland Kitchen Wash your hands often with soap and warm water for at least 20 seconds.  If soap and water are not readily available, use an alcohol-based hand sanitizer with at least 60% alcohol.  . If coughing  or sneezing, cover your mouth and nose by coughing or sneezing into the elbow areas of your shirt or coat, into a tissue or into your sleeve (not your hands). . Avoid shaking hands with others and consider head nods or verbal greetings only. . Avoid touching your eyes, nose, or mouth with unwashed hands.  . Avoid close contact with people who are sick. . Avoid places or events with large numbers of people in one location, like concerts or sporting  events. . Carefully consider travel plans you have or are making. . If you are planning any travel outside or inside the US, visit the CDC's Travelers' Health webpage for the latest health notices. . If you have some symptoms but not all symptoms, continue to monitor at home and seek medical attention if your symptoms worsen. . If you are having a medical emergency, call 911.   ADDITIONAL HEALTHCARE OPTIONS FOR PATIENTS  [facility] Telehealth / e-Visit: [email or web address]         [facility]: [phone]  [facility]: [phone]                   [facility]: [phone]    

## 2019-03-08 NOTE — Telephone Encounter (Signed)
referral to establish Dr Clarene Essex, GI

## 2019-03-16 MED FILL — VENCLEXTA 100 MG TABS: 100 | 30 days supply | Qty: 120 | Fill #2

## 2019-03-30 NOTE — Progress Notes (Signed)
HEMATOLOGY/ONCOLOGY CLINIC NOTE  Date of Service: 03/31/19   Patient Care Team: Unk Pinto, MD as PCP - General (Internal Medicine) Inda Castle, MD (Inactive) as Consulting Physician (Gastroenterology) Melissa Noon, [place] as Referring Physician (Optometry) Danis, Kirke Corin, MD as Consulting Physician (Gastroenterology) Brunetta Genera, MD as Consulting Physician (Hematology)  CHIEF COMPLAINTS/PURPOSE OF CONSULTATION:  F/u for CD5 neg CLL   HISTORY OF PRESENTING ILLNESS:   Javier [name] is a wonderful 78 y.o. male who has been referred to [place] by Dr .Unk Pinto, MD for evaluation and management of lymphocytosis concerning for a lymphoproliferative process.  Patient has a h/o HTN, HLD, Prediabetes who notes that he had significant abdominal discomfort with significant dyspeptic symptoms around aug 2017 and had about 20-25lbs weight loss and had an extensive GI workup including CT abd/pelvis 05/25/2016 - WNL, EGD which showed chronic gastritis (helicobacter pylori +ve) and colonscopy with some polyps. Capsule endoscopy was not approved by insurance. Patients symptoms improved after treatment of H.pylori and he notes that he has gained back about 6-7 lbs and is eating better.  Patients labs were noted to show some leucocytosis/Lymphocytosis for which he was referred to [place] for further evaluation.  Patient notes some nightsweats and weight loss (as noted above, possible alternative explanation). No fevers/chills. Has not noted any overt enlarged LN and CT abd in 04/2016 showed no hepatomegaly or splenomegaly and no abd/RP LNadenopathy.  Patient notes no other acute focal symptoms.   INTERVAL HISTORY  Javier [name] is here for management and evaluation of his CLL. The patient's last visit with [place] was on 03/03/19. The pt reports that he is doing well overall.  The pt reports that he has been burping in the mornings, denies passing much gas. Endorses acid reflux, and  denies taking tums or other acid suppressants. The pt denies any nausea or vomiting. He denies consuming much soda and endorses drinking lots of water. Denies chewing gum frequently.   Additionally the pt now denies any night sweats. He also denies fevers or chills or other concerns for infections. He endorses stable energy levels overall.   He notes that he had some mild right ankle swelling for two days in the interim, which resolved on its own, and resolved in the night time between these two days  Lab results today (03/31/19) of CBC w/diff, Reticulocytes is as follows: all values are WNL except for WBC at 3.6k. 03/31/19 CMP is pending 03/31/19 Uric acid is pending  On review of systems, pt reports belching in mornings, acid reflux, stable energy levels, and denies nausea, vomiting, fevers, chills, night sweats, and any other symptoms.   MEDICAL HISTORY:  Past Medical History:  Diagnosis Date  . Diverticulosis   . Hyperlipidemia   . Hypertension   . Prediabetes   . Vitamin D deficiency   Helicobacter pylori +ve   SURGICAL HISTORY: Past Surgical History:  Procedure Laterality Date  . NO PAST SURGERIES      SOCIAL HISTORY: Social History   Socioeconomic History  . Marital status: Married    Spouse name: Not on file  . Number of children: 5  . Years of education: Not on file  . Highest education level: Not on file  Occupational History  . Occupation: retired  Scientific laboratory technician  . Financial resource strain: Not on file  . Food insecurity    Worry: Not on file    Inability: Not on file  . Transportation needs  Medical: Not on file    Non-medical: Not on file  Tobacco Use  . Smoking status: Former Smoker    Types: Cigarettes    Quit date: 01/17/2003    Years since quitting: 16.2  . Smokeless tobacco: Never Used  Substance and Sexual Activity  . Alcohol use: Yes    Comment: very rare  . Drug use: No  . Sexual activity: Not on file  Lifestyle  . Physical activity    Days  per week: Not on file    Minutes per session: Not on file  . Stress: Not on file  Relationships  . Social Herbalist on phone: Not on file    Gets together: Not on file    Attends religious service: Not on file    Active member of club or organization: Not on file    Attends meetings of clubs or organizations: Not on file    Relationship status: Not on file  . Intimate partner violence    Fear of current or ex partner: Not on file    Emotionally abused: Not on file    Physically abused: Not on file    Forced sexual activity: Not on file  Other Topics Concern  . Not on file  Social History Narrative  . Not on file  Quit smoking >10 yrs ago, previously 1.5Packs per week. Started smoking in his teens. Retired. Worked as a Chief Financial Officer.   FAMILY HISTORY: Family History  Problem Relation Age of Onset  . Stroke Mother   . Early death Father        Farm/tractor accident  . Diabetes Brother   . Cirrhosis Brother   . Colon cancer Paternal Grandfather     ALLERGIES:  is allergic to penicillins and ppd [tuberculin purified protein derivative].  MEDICATIONS:  Current Outpatient Medications  Medication Sig Dispense Refill  . ALPRAZolam (XANAX) 0.5 MG tablet Take 1/2-1 tablet 2 - 3 x /day ONLY if needed for Anxiety Attack &  limit to 5 days /week to avoid addiction 90 tablet 0  . Alum & Mag Hydroxide-Simeth (MYLANTA PO) Take by mouth as needed.    Marland Kitchen amLODipine (NORVASC) 2.5 MG tablet One pill at night for blood pressure 90 tablet 2  . aspirin EC 81 MG tablet Take 81 mg by mouth daily.    . Cholecalciferol (VITAMIN D3) 5000 units TABS Take 5,000 Units by mouth daily.    . famotidine (PEPCID) 20 MG tablet Take 1 tablet (20 mg total) by mouth daily before breakfast. 30 tablet 1  . losartan-hydrochlorothiazide (HYZAAR) 100-25 MG tablet Take 1 tablet Daily for BP & Fluid 90 tablet 1  . omeprazole (PRILOSEC) 40 MG capsule Take 1 capsule (40 mg total) by mouth daily. 90 capsule  1  . pravastatin (PRAVACHOL) 40 MG tablet TAKE 1 TABLET AT BEDTIME  FOR  CHOLESTEROL (Patient taking differently: Take 40 mg by mouth at bedtime. ) 90 tablet 3  . sucralfate (CARAFATE) 1 g tablet Take 1 tablet (1 g total) by mouth 4 (four) times daily -  with meals and at bedtime. 120 tablet 1  . triamcinolone (NASACORT) 55 MCG/ACT AERO nasal inhaler Place 2 sprays into the nose at bedtime. 1 Inhaler 3  . venetoclax 100 MG TABS Take 400 mg by mouth daily. Take with food and water 120 tablet 2   No current facility-administered medications for this visit.     REVIEW OF SYSTEMS:    A 10+ POINT REVIEW  OF SYSTEMS WAS OBTAINED including neurology, dermatology, psychiatry, cardiac, respiratory, lymph, extremities, GI, GU, Musculoskeletal, constitutional, breasts, reproductive, HEENT.  All pertinent positives are noted in the HPI.  All others are negative.   PHYSICAL EXAMINATION: ECOG PERFORMANCE STATUS: 1 - Symptomatic but completely ambulatory  Vitals:   03/31/19 0853  BP: (!) 163/80  Pulse: 62  Resp: 18  Temp: 98.7 F (37.1 C)  SpO2: 100%   Filed Weights   03/31/19 0853  Weight: 180 lb 6.4 oz (81.8 kg)   .Body mass index is 25.16 kg/m.  GENERAL:alert, in no acute distress and comfortable SKIN: no acute rashes, no significant lesions EYES: conjunctiva are pink and non-injected, sclera anicteric OROPHARYNX: MMM, no exudates, no oropharyngeal erythema or ulceration NECK: supple, no JVD LYMPH:  no palpable lymphadenopathy in the cervical, axillary or inguinal regions LUNGS: clear to auscultation b/l with normal respiratory effort HEART: regular rate & rhythm ABDOMEN:  normoactive bowel sounds , non tender, not distended. No palpable hepatosplenomegaly.  Extremity: no pedal edema PSYCH: alert & oriented x 3 with fluent speech NEURO: no focal motor/sensory deficits   LABORATORY DATA:  I have reviewed the data as listed  . CBC Latest Ref Rng & Units 03/31/2019 03/03/2019 02/18/2019   WBC 4.0 - 10.5 K/uL 3.6(L) 3.7(L) 3.4(L)  Hemoglobin 13.0 - 17.0 g/dL 13.2 12.2(L) 12.8(L)  Hematocrit 39.0 - 52.0 % 40.1 38.5(L) 39.0  Platelets 150 - 400 K/uL 163 141(L) 212   . CBC    Component Value Date/Time   WBC 3.6 (L) 03/31/2019 0839   RBC 4.45 03/31/2019 0839   RBC 4.47 03/31/2019 0839   HGB 13.2 03/31/2019 0839   HGB 12.2 (L) 01/03/2019 1509   HGB 14.5 10/21/2016 1324   HCT 40.1 03/31/2019 0839   HCT 43.8 10/21/2016 1324   PLT 163 03/31/2019 0839   PLT 152 01/03/2019 1509   PLT 157 10/21/2016 1324   MCV 90.1 03/31/2019 0839   MCV 90.9 10/21/2016 1324   MCH 29.7 03/31/2019 0839   MCHC 32.9 03/31/2019 0839   RDW 13.3 03/31/2019 0839   RDW 13.3 10/21/2016 1324   LYMPHSABS 1.1 03/31/2019 0839   LYMPHSABS 17.6 (H) 10/21/2016 1324   MONOABS 0.5 03/31/2019 0839   MONOABS 0.6 10/21/2016 1324   EOSABS 0.0 03/31/2019 0839   EOSABS 0.0 10/21/2016 1324   BASOSABS 0.0 03/31/2019 0839   BASOSABS 0.1 10/21/2016 1324     . CMP Latest Ref Rng & Units 03/03/2019 02/18/2019 02/10/2019  Glucose 70 - 99 mg/dL 114(H) 112(H) 104(H)  BUN 8 - 23 mg/dL '14 14 19  ' Creatinine 0.61 - 1.24 mg/dL 1.23 1.15 1.20  Sodium 135 - 145 mmol/L 141 140 140  Potassium 3.5 - 5.1 mmol/L 3.7 3.6 3.4(L)  Chloride 98 - 111 mmol/L 109 105 106  CO2 22 - 32 mmol/L '23 25 24  ' Calcium 8.9 - 10.3 mg/dL 9.2 9.3 9.2  Total Protein 6.5 - 8.1 g/dL 6.2(L) 6.7 6.5  Total Bilirubin 0.3 - 1.2 mg/dL 0.4 0.5 0.6  Alkaline Phos 38 - 126 U/L 59 64 71  AST 15 - 41 U/L 12(L) 14(L) 16  ALT 0 - 44 U/L '9 15 22   ' . Lab Results  Component Value Date   LDH 172 02/03/2019         RADIOGRAPHIC STUDIES: I have personally reviewed the radiological images as listed and agreed with the findings in the report. No results found.  ASSESSMENT & PLAN:   78 y.o. 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 FISH panel showed p53 (17p13) deletion that would be consistent with  this diagnosis . It often represents also be more aggressive form of CLL Differential diagnosis includes - CD5 neg CLL vs splenic lymphoma vs Marginal Zone lymphoma vs LPL (lymphoplasmacytic lymphoma) LDH WNL suggests against a high grade process. Currently no anemia or thrombocytopenia noted. Now with drenching night sweats and fatigue, no fevers/chills.  10/31/16 PET/CT scan shows some small hypermetabolic nodes in the thorax including bilateral hilar paratracheal) esophageal. These would be difficult to biopsy.  Lost to follow up between 11/06/16 and 11/19/18  12/01/18 PET/CT revealed Stable scattered small hypermetabolic thoracic lymph nodes, once again at [place] 5 activity level. 2. No findings of involvement in the neck, abdomen/pelvis, or skeleton. 3. Prostatomegaly. 4. Aortic Atherosclerosis and Emphysema 5. Sigmoid colon diverticulosis  PLAN -Discussed pt labwork today, 03/31/19; blood counts are stable. Chemistries are pending -The pt has no prohibitive toxicities from continuing C3D1 Gazyva and 423m Venetoclax at this time. -Plan for total of 6 months of Gazyva -Recommend pt continue to stay active and focus on at least one intellectual pursuit each day -Recommend the pt take simethicone otc and avoid chewing gum and sodas -Minimize salt in diet -Pt has been able to consume at least 48-60 oz of water each day, and stressed that he should continue to do this, and we will hold IV fluids in light of this -Advised raising the head of his bed and not eating food within 2 hours of ideal bed time -Recommend following up with PCP for prostatomegaly management and recommendations -Recommended that the pt continue to eat well, drink at least 48-64 oz of water each day, and walk 20-30 minutes each day. -Will see the pt back in one month   2) . Patient Active Problem List   Diagnosis Date Noted  . Counseling regarding advance care planning and goals of care 01/24/2019  . Anxiety 02/08/2018   . CLL (chronic lymphocytic leukemia) ([place] 11/01/2016  . Encounter for Medicare annual wellness exam 09/11/2015  . GERD  02/09/2015  . Medication management 11/02/2013  . Hyperlipidemia   . Hypertension   . Abnormal glucose   . Vitamin D deficiency   . Diverticulosis    -f/u with PCP for management of other medical co-morbids  3) H.pylori gastritis -f/u with GI to ensure eradication of h.pylori post treatment.   RTC as per appointments on 7/30 with labs, [facility]   All of the patients questions were answered with apparent satisfaction. The patient knows to call the clinic with any problems, questions or concerns.  The total time spent in the appt was 25 minutes and more than 50% was on counseling and direct patient cares.    GSullivan LoneMD MS AAHIVMS SPinnaclehealth Harris[facility] CampusCMadison Street Surgery Center LLCHematology/Oncology Physician CSt [facility], [place] (Office):       [phone](Work cell):  [phone](Fax):           [phone] 03/31/2019 9:34 AM  I, SBaldwin Jamaica am acting as a scribe for Dr. GSullivan Lone   .I have reviewed the above documentation for accuracy and completeness, and I agree with the above.  .Brunetta GeneraMD

## 2019-03-31 ENCOUNTER — Inpatient Hospital Stay: Payer: Medicare HMO | Attending: Hematology

## 2019-03-31 ENCOUNTER — Other Ambulatory Visit: Payer: Self-pay

## 2019-03-31 ENCOUNTER — Telehealth: Payer: Self-pay | Admitting: Hematology

## 2019-03-31 ENCOUNTER — Inpatient Hospital Stay (HOSPITAL_BASED_OUTPATIENT_CLINIC_OR_DEPARTMENT_OTHER): Payer: Medicare HMO | Admitting: Hematology

## 2019-03-31 ENCOUNTER — Inpatient Hospital Stay: Payer: Medicare HMO

## 2019-03-31 VITALS — BP 121/86 | HR 68 | Temp 97.6°F | Resp 20

## 2019-03-31 VITALS — BP 163/80 | HR 62 | Temp 98.7°F | Resp 18 | Ht 71.0 in | Wt 180.4 lb

## 2019-03-31 DIAGNOSIS — I10 Essential (primary) hypertension: Secondary | ICD-10-CM | POA: Diagnosis not present

## 2019-03-31 DIAGNOSIS — Z7982 Long term (current) use of aspirin: Secondary | ICD-10-CM | POA: Insufficient documentation

## 2019-03-31 DIAGNOSIS — Z5112 Encounter for antineoplastic immunotherapy: Secondary | ICD-10-CM | POA: Insufficient documentation

## 2019-03-31 DIAGNOSIS — Z79899 Other long term (current) drug therapy: Secondary | ICD-10-CM | POA: Insufficient documentation

## 2019-03-31 DIAGNOSIS — Z7189 Other specified counseling: Secondary | ICD-10-CM

## 2019-03-31 DIAGNOSIS — J439 Emphysema, unspecified: Secondary | ICD-10-CM | POA: Insufficient documentation

## 2019-03-31 DIAGNOSIS — R61 Generalized hyperhidrosis: Secondary | ICD-10-CM

## 2019-03-31 DIAGNOSIS — I700 Atherosclerosis of aorta: Secondary | ICD-10-CM | POA: Diagnosis not present

## 2019-03-31 DIAGNOSIS — E559 Vitamin D deficiency, unspecified: Secondary | ICD-10-CM | POA: Insufficient documentation

## 2019-03-31 DIAGNOSIS — C9110 Chronic lymphocytic leukemia of B-cell type not having achieved remission: Secondary | ICD-10-CM

## 2019-03-31 DIAGNOSIS — N400 Enlarged prostate without lower urinary tract symptoms: Secondary | ICD-10-CM | POA: Diagnosis not present

## 2019-03-31 DIAGNOSIS — K5730 Diverticulosis of large intestine without perforation or abscess without bleeding: Secondary | ICD-10-CM | POA: Diagnosis not present

## 2019-03-31 DIAGNOSIS — E785 Hyperlipidemia, unspecified: Secondary | ICD-10-CM | POA: Diagnosis not present

## 2019-03-31 DIAGNOSIS — Z87891 Personal history of nicotine dependence: Secondary | ICD-10-CM | POA: Insufficient documentation

## 2019-03-31 LAB — CMP (CANCER CENTER ONLY)
ALT: 11 U/L (ref 0–44)
AST: 13 U/L — ABNORMAL LOW (ref 15–41)
Albumin: 3.6 g/dL (ref 3.5–5.0)
Alkaline Phosphatase: 71 U/L (ref 38–126)
Anion gap: 10 (ref 5–15)
BUN: 12 mg/dL (ref 8–23)
CO2: 22 mmol/L (ref 22–32)
Calcium: 9.2 mg/dL (ref 8.9–10.3)
Chloride: 108 mmol/L (ref 98–111)
Creatinine: 1.27 mg/dL — ABNORMAL HIGH (ref 0.61–1.24)
GFR, Est AFR Am: >60 mL/min (ref >60)
GFR, Estimated: 54 mL/min — ABNORMAL LOW (ref >60)
Glucose, Bld: 91 mg/dL (ref 70–99)
Potassium: 3.6 mmol/L (ref 3.5–5.1)
Sodium: 140 mmol/L (ref 135–145)
Total Bilirubin: 0.5 mg/dL (ref 0.3–1.2)
Total Protein: 6.7 g/dL (ref 6.5–8.1)

## 2019-03-31 LAB — CBC WITH DIFFERENTIAL/PLATELET
Abs Immature Granulocytes: 0.01 10*3/uL (ref 0.00–0.07)
Basophils Absolute: 0 10*3/uL (ref 0.0–0.1)
Basophils Relative: 0 %
Eosinophils Absolute: 0 10*3/uL (ref 0.0–0.5)
Eosinophils Relative: 0 %
HCT: 40.1 % (ref 39.0–52.0)
Hemoglobin: 13.2 g/dL (ref 13.0–17.0)
Immature Granulocytes: 0 %
Lymphocytes Relative: 32 %
Lymphs Abs: 1.1 10*3/uL (ref 0.7–4.0)
MCH: 29.7 pg (ref 26.0–34.0)
MCHC: 32.9 g/dL (ref 30.0–36.0)
MCV: 90.1 fL (ref 80.0–100.0)
Monocytes Absolute: 0.5 10*3/uL (ref 0.1–1.0)
Monocytes Relative: 15 %
Neutro Abs: 1.9 10*3/uL (ref 1.7–7.7)
Neutrophils Relative %: 53 %
Platelets: 163 10*3/uL (ref 150–400)
RBC: 4.45 MIL/uL (ref 4.22–5.81)
RDW: 13.3 % (ref 11.5–15.5)
WBC: 3.6 10*3/uL — ABNORMAL LOW (ref 4.0–10.5)
nRBC: 0 % (ref 0.0–0.2)

## 2019-03-31 LAB — URIC ACID: Uric Acid, Serum: 4.4 mg/dL (ref 3.7–8.6)

## 2019-03-31 LAB — RETICULOCYTES
Immature Retic Fract: 15.6 % (ref 2.3–15.9)
RBC.: 4.47 MIL/uL (ref 4.22–5.81)
Retic Count, Absolute: 50.1 10*3/uL (ref 19.0–186.0)
Retic Ct Pct: 1.1 % (ref 0.4–3.1)

## 2019-03-31 MED ORDER — SODIUM CHLORIDE 0.9 % IV SOLN
20.0000 mg | Freq: Once | INTRAVENOUS | Status: AC
  Administered 2019-03-31: 11:00:00 20 mg via INTRAVENOUS
  Filled 2019-03-31: qty 20

## 2019-03-31 MED ORDER — FAMOTIDINE IN NACL 20-0.9 MG/50ML-% IV SOLN
20.0000 mg | Freq: Once | INTRAVENOUS | Status: AC
  Administered 2019-03-31: 20 mg via INTRAVENOUS

## 2019-03-31 MED ORDER — ACETAMINOPHEN 325 MG PO TABS
650.0000 mg | ORAL_TABLET | Freq: Once | ORAL | Status: AC
  Administered 2019-03-31: 10:00:00 650 mg via ORAL

## 2019-03-31 MED ORDER — SODIUM CHLORIDE 0.9 % IV SOLN
Freq: Once | INTRAVENOUS | Status: AC
  Administered 2019-03-31: 10:00:00 via INTRAVENOUS
  Filled 2019-03-31: qty 250

## 2019-03-31 MED ORDER — SODIUM CHLORIDE 0.9 % IV SOLN
1000.0000 mg | Freq: Once | INTRAVENOUS | Status: AC
  Administered 2019-03-31: 1000 mg via INTRAVENOUS
  Filled 2019-03-31: qty 40

## 2019-03-31 MED ORDER — FAMOTIDINE IN NACL 20-0.9 MG/50ML-% IV SOLN
INTRAVENOUS | Status: AC
  Filled 2019-03-31: qty 50

## 2019-03-31 MED ORDER — ACETAMINOPHEN 325 MG PO TABS
ORAL_TABLET | ORAL | Status: AC
  Filled 2019-03-31: qty 2

## 2019-03-31 MED ORDER — HEPARIN SOD (PORK) LOCK FLUSH 100 UNIT/ML IV SOLN
500.0000 [IU] | Freq: Once | INTRAVENOUS | Status: DC | PRN
  Filled 2019-03-31: qty 5

## 2019-03-31 MED ORDER — DIPHENHYDRAMINE HCL 50 MG/ML IJ SOLN
50.0000 mg | Freq: Once | INTRAMUSCULAR | Status: AC
  Administered 2019-03-31: 10:00:00 50 mg via INTRAVENOUS

## 2019-03-31 MED ORDER — SODIUM CHLORIDE 0.9% FLUSH
10.0000 mL | INTRAVENOUS | Status: DC | PRN
  Filled 2019-03-31: qty 10

## 2019-03-31 MED ORDER — DIPHENHYDRAMINE HCL 50 MG/ML IJ SOLN
INTRAMUSCULAR | Status: AC
  Filled 2019-03-31: qty 1

## 2019-03-31 NOTE — Telephone Encounter (Signed)
Per 7/2 los RTC as per appointments on 7/30 with labs, MD visit and next [facility] treatment.

## 2019-03-31 NOTE — Patient Instructions (Signed)
[facility] Discharge Instructions for Patients Receiving Chemotherapy  Today you received the following Immunotherapy: Gazyva  To help prevent nausea and vomiting after your treatment, we encourage you to take your nausea medication as directed by your MD.   If you develop nausea and vomiting that is not controlled by your nausea medication, call the clinic.   BELOW ARE SYMPTOMS THAT SHOULD BE REPORTED IMMEDIATELY:  *FEVER GREATER THAN 100.5 F  *CHILLS WITH OR WITHOUT FEVER  NAUSEA AND VOMITING THAT IS NOT CONTROLLED WITH YOUR NAUSEA MEDICATION  *UNUSUAL SHORTNESS OF BREATH  *UNUSUAL BRUISING OR BLEEDING  TENDERNESS IN MOUTH AND THROAT WITH OR WITHOUT PRESENCE OF ULCERS  *URINARY PROBLEMS  *BOWEL PROBLEMS  UNUSUAL RASH Items with * indicate a potential emergency and should be followed up as soon as possible.  Feel free to call the clinic should you have any questions or concerns. The clinic phone number is [phone].  Please show the [place] at check-in to the Emergency Department and triage nurse.  Coronavirus (COVID-19) Are you at risk?  Are you at risk for the Coronavirus (COVID-19)?  To be considered HIGH RISK for Coronavirus (COVID-19), you have to meet the following criteria:  . Traveled to [place], [place], [place], [place] or [place]; or in the [place] to [place], [place], [place], or [place]; and have fever, cough, and shortness of breath within the last 2 weeks of travel OR . Been in close contact with a person diagnosed with COVID-19 within the last 2 weeks and have fever, cough, and shortness of breath . IF YOU DO NOT MEET THESE CRITERIA, YOU ARE CONSIDERED LOW RISK FOR COVID-19.  What to do if you are HIGH RISK for COVID-19?  Marland Kitchen If you are having a medical emergency, call 911. . Seek medical care right away. Before you go to a doctor's office, urgent care or emergency department, call ahead and tell them about your  recent travel, contact with someone diagnosed with COVID-19, and your symptoms. You should receive instructions from your physician's office regarding next steps of care.  . When you arrive at healthcare provider, tell the healthcare staff immediately you have returned from visiting [place], [place], [place], [place] or [place]; or traveled in the [place] to [place], [place], [place], or [place]; in the last two weeks or you have been in close contact with a person diagnosed with COVID-19 in the last 2 weeks.   . Tell the health care staff about your symptoms: fever, cough and shortness of breath. . After you have been seen by a medical provider, you will be either: o Tested for (COVID-19) and discharged home on quarantine except to seek medical care if symptoms worsen, and asked to  - Stay home and avoid contact with others until you get your results (4-5 days)  - Avoid travel on public transportation if possible (such as bus, train, or airplane) or o Sent to the Emergency Department by EMS for evaluation, COVID-19 testing, and possible admission depending on your condition and test results.  What to do if you are LOW RISK for COVID-19?  Reduce your risk of any infection by using the same precautions used for avoiding the common cold or flu:  Marland Kitchen Wash your hands often with soap and warm water for at least 20 seconds.  If soap and water are not readily available, use an alcohol-based hand sanitizer with at least 60% alcohol.  . If coughing  or sneezing, cover your mouth and nose by coughing or sneezing into the elbow areas of your shirt or coat, into a tissue or into your sleeve (not your hands). . Avoid shaking hands with others and consider head nods or verbal greetings only. . Avoid touching your eyes, nose, or mouth with unwashed hands.  . Avoid close contact with people who are sick. . Avoid places or events with large numbers of people in one location, like concerts or sporting  events. . Carefully consider travel plans you have or are making. . If you are planning any travel outside or inside the [place], visit the CDC's Travelers' Health webpage for the latest health notices. . If you have some symptoms but not all symptoms, continue to monitor at home and seek medical attention if your symptoms worsen. . If you are having a medical emergency, call 911.   ADDITIONAL HEALTHCARE OPTIONS FOR PATIENTS   Telehealth / e-Visit: [email or web address]         [facility]: [phone]  [facility]: [phone]                   [facility]: [phone]     

## 2019-04-18 ENCOUNTER — Telehealth: Payer: Self-pay | Admitting: Hematology

## 2019-04-18 NOTE — Telephone Encounter (Signed)
R/s apt  Per 7/17 sch mesage- spoke with patient and he is aware of appt being moved from 7/30 to 7/29 @ 8:30

## 2019-04-22 NOTE — Progress Notes (Deleted)
FOLLOW UP  Assessment and Plan:   Hypertension Well controlled with current medications  Monitor blood pressure at home; patient to call if consistently greater than 130/80 Continue DASH diet.   Reminder to go to the ER if any CP, SOB, nausea, dizziness, severe HA, changes vision/speech, left arm numbness and tingling and jaw pain.  Cholesterol Currently at goal; continue pravastatin 40 mg daily  Continue low cholesterol diet and exercise.  Check lipid panel.   Prediabetes Continue diet and exercise.  Perform daily foot/skin check, notify office of any concerning changes.  Check A1C  BMI 24 Continue to recommend diet heavy in fruits and veggies and low in animal meats, cheeses, and dairy products, appropriate calorie intake Discuss exercise recommendations routinely Continue to monitor weight at each visit  Vitamin D Def At goal at last visit; continue supplementation to maintain goal of 70-100 Defer Vit D level  Anxiety Well managed by current regimen; continue medications; rare use of benzo Stress management techniques discussed, increase water, good sleep hygiene discussed, increase exercise, and increase veggies.    GERD Well managed on current medications; PPI due to esophagitis Discussed diet, avoiding triggers and other lifestyle changes Monitor magnesium, calcium, vit D  CLL Check CBC; he is overdue for follow up with Dr. Irene Limbo; # given to call and schedule He is reporting some concerning night sweats over the last month without any other notable changes.   Continue diet and meds as discussed. Further disposition pending results of labs. Discussed med's effects and SE's.   Over 30 minutes of exam, counseling, chart review, and critical decision making was performed.   Future Appointments  Date Time Provider [place]  04/25/2019 10:45 AM Vicie Mutters, PA-C [facility] None  04/27/2019  8:30 AM [facility] LAB 5 [facility] None  04/27/2019  9:00 AM  Brunetta Genera, MD [facility] None  04/27/2019 10:00 AM [facility] INFUSION [facility] None  05/26/2019  8:45 AM [facility] LAB 5 [facility] None  05/26/2019  9:20 AM Brunetta Genera, MD [facility] None  05/26/2019 10:00 AM [facility] INFUSION [facility] None    ----------------------------------------------------------------------------------------------------------------------  HPI 78 y.o. male  presents for 3 month follow up on hypertension, cholesterol, prediabetes, weight and vitamin D deficiency.   he has a diagnosis of anxiety and is currently on xanax 0.25 mg QID PRN, reports symptoms are well controlled on current regimen. he reports symptoms are improved, hasn't used in over a month.  He has a history of CLL, being following by Dr. Irene Limbo, on [place] and 400mg  Venetoclax at this time. Lab Results  Component Value Date   WBC 3.6 (L) 03/31/2019   HGB 13.2 03/31/2019   HCT 40.1 03/31/2019   MCV 90.1 03/31/2019   PLT 163 03/31/2019    BMI is There is no height or weight on file to calculate BMI., he has been working on diet and exercise. Wt Readings from Last 3 Encounters:  03/31/19 180 lb 6.4 oz (81.8 kg)  03/03/19 178 lb 11.2 oz (81.1 kg)  03/02/19 178 lb (80.7 kg)   His blood pressure has been controlled at home, today their BP is    He does workout. He denies chest pain, shortness of breath, dizziness.   He is on cholesterol medication Pravastatin and denies myalgias. His cholesterol is at goal. The cholesterol last visit was:   Lab Results  Component Value Date   CHOL 188 09/15/2018   HDL 41 09/15/2018   LDLCALC 114 (H) 09/15/2018   TRIG 214 (H) 09/15/2018  CHOLHDL 4.6 09/15/2018    He has been working on diet and exercise for prediabetes, and denies increased appetite, nausea, paresthesia of the feet, polydipsia and polyuria. Last A1C in the office was:  Lab Results  Component Value Date   HGBA1C 5.4 09/15/2018   Patient is on Vitamin D  supplement.   Lab Results  Component Value Date   VD25OH 66 06/09/2018        Current Medications:  Current Outpatient Medications on File Prior to Visit  Medication Sig  . ALPRAZolam (XANAX) 0.5 MG tablet Take 1/2-1 tablet 2 - 3 x /day ONLY if needed for Anxiety Attack &  limit to 5 days /week to avoid addiction  . Alum & Mag Hydroxide-Simeth (MYLANTA PO) Take by mouth as needed.  Marland Kitchen amLODipine (NORVASC) 2.5 MG tablet One pill at night for blood pressure  . aspirin EC 81 MG tablet Take 81 mg by mouth daily.  . Cholecalciferol (VITAMIN D3) 5000 units TABS Take 5,000 Units by mouth daily.  . famotidine (PEPCID) 20 MG tablet Take 1 tablet (20 mg total) by mouth daily before breakfast.  . losartan-hydrochlorothiazide (HYZAAR) 100-25 MG tablet Take 1 tablet Daily for BP & Fluid  . omeprazole (PRILOSEC) 40 MG capsule Take 1 capsule (40 mg total) by mouth daily.  . pravastatin (PRAVACHOL) 40 MG tablet TAKE 1 TABLET AT BEDTIME  FOR  CHOLESTEROL (Patient taking differently: Take 40 mg by mouth at bedtime. )  . sucralfate (CARAFATE) 1 g tablet Take 1 tablet (1 g total) by mouth 4 (four) times daily -  with meals and at bedtime.  . triamcinolone (NASACORT) 55 MCG/ACT AERO nasal inhaler Place 2 sprays into the nose at bedtime.  Marland Kitchen venetoclax 100 MG TABS Take 400 mg by mouth daily. Take with food and water   No current facility-administered medications on file prior to visit.      Allergies:  Allergies  Allergen Reactions  . Penicillins Other (See Comments)    Unknown allergic reaction as a teenager Did it involve swelling of the face/tongue/throat, SOB, or low BP? Unknown Did it involve sudden or severe rash/hives, skin peeling, or any reaction on the inside of your mouth or nose? Unknown Did you need to seek medical attention at a hospital or doctor's office? Unknown When did it last happen?Teenager If all above answers are "NO", may proceed with cephalosporin use.   Marland Kitchen Ppd [Tuberculin  Purified Protein Derivative]     Positive test in 2004     Medical History:  Past Medical History:  Diagnosis Date  . Diverticulosis   . Hyperlipidemia   . Hypertension   . Prediabetes   . Vitamin D deficiency    Family history- Reviewed and unchanged Social history- Reviewed and unchanged   Review of Systems:  Review of Systems  Constitutional: Negative for malaise/fatigue and weight loss.  HENT: Negative for hearing loss and tinnitus.   Eyes: Negative for blurred vision and double vision.  Respiratory: Negative for cough, shortness of breath and wheezing.   Cardiovascular: Negative for chest pain, palpitations, orthopnea, claudication and leg swelling.  Gastrointestinal: Negative for abdominal pain, blood in stool, constipation, diarrhea, heartburn, melena, nausea and vomiting.  Genitourinary: Negative.   Musculoskeletal: Negative for joint pain and myalgias.  Skin: Negative for rash.  Neurological: Negative for dizziness, tingling, sensory change, weakness and headaches.  Endo/Heme/Allergies: Negative for polydipsia.  Psychiatric/Behavioral: Negative.   All other systems reviewed and are negative.     Physical Exam: There  were no vitals taken for this visit. Wt Readings from Last 3 Encounters:  03/31/19 180 lb 6.4 oz (81.8 kg)  03/03/19 178 lb 11.2 oz (81.1 kg)  03/02/19 178 lb (80.7 kg)   General Appearance: Well nourished, in no apparent distress. Eyes: PERRLA, EOMs, conjunctiva no swelling or erythema Sinuses: No Frontal/maxillary tenderness ENT/Mouth: Ext aud canals clear, TMs without erythema, bulging. No erythema, swelling, or exudate on post pharynx.  Tonsils not swollen or erythematous. Hearing normal.  Neck: Supple, thyroid normal.  Respiratory: Respiratory effort normal, BS equal bilaterally without rales, rhonchi, wheezing or stridor.  Cardio: RRR with no MRGs. Brisk peripheral pulses without edema.  Abdomen: Soft, + BS.  Non tender, no guarding,  rebound, hernias, masses. Lymphatics: Non tender without lymphadenopathy.  Musculoskeletal: Full ROM, 5/5 strength, Normal gait Skin: Warm, dry without rashes, lesions, ecchymosis.  Neuro: Cranial nerves intact. No cerebellar symptoms.  Psych: Awake and oriented X 3, normal affect, Insight and Judgment appropriate.    Vicie Mutters, PA-C 9:36 AM [facility]

## 2019-04-25 ENCOUNTER — Ambulatory Visit: Payer: Medicare HMO | Admitting: Physician Assistant

## 2019-04-26 ENCOUNTER — Other Ambulatory Visit: Payer: Self-pay | Admitting: Hematology

## 2019-04-26 DIAGNOSIS — C9110 Chronic lymphocytic leukemia of B-cell type not having achieved remission: Secondary | ICD-10-CM

## 2019-04-26 NOTE — Progress Notes (Signed)
HEMATOLOGY/ONCOLOGY CLINIC NOTE  Date of Service: 04/27/19   Patient Care Team: Unk Pinto, MD as PCP - General (Internal Medicine) Inda Castle, MD (Inactive) as Consulting Physician (Gastroenterology) Melissa Noon, [place] as Referring Physician (Optometry) Danis, Kirke Corin, MD as Consulting Physician (Gastroenterology) Brunetta Genera, MD as Consulting Physician (Hematology)  CHIEF COMPLAINTS/PURPOSE OF CONSULTATION:  F/u for CD5 neg CLL   HISTORY OF PRESENTING ILLNESS:   Javier [name] is a wonderful 78 y.o. male who has been referred to [place] by Dr .Unk Pinto, MD for evaluation and management of lymphocytosis concerning for a lymphoproliferative process.  Patient has a h/o HTN, HLD, Prediabetes who notes that he had significant abdominal discomfort with significant dyspeptic symptoms around aug 2017 and had about 20-25lbs weight loss and had an extensive GI workup including CT abd/pelvis 05/25/2016 - WNL, EGD which showed chronic gastritis (helicobacter pylori +ve) and colonscopy with some polyps. Capsule endoscopy was not approved by insurance. Patients symptoms improved after treatment of H.pylori and he notes that he has gained back about 6-7 lbs and is eating better.  Patients labs were noted to show some leucocytosis/Lymphocytosis for which he was referred to [place] for further evaluation.  Patient notes some nightsweats and weight loss (as noted above, possible alternative explanation). No fevers/chills. Has not noted any overt enlarged LN and CT abd in 04/2016 showed no hepatomegaly or splenomegaly and no abd/RP LNadenopathy.  Patient notes no other acute focal symptoms.   INTERVAL HISTORY  Javier [name] is here for management and evaluation of his CLL. The patient's last visit with [place] was on 03/31/2019. Today is C4D1 Gazyva and 490m Venetoclax. The pt reports that he is doing well overall.  The pt reports b/l ankle edema that goes away overnight. It does  cause him any pain. He is staying active and eating well. He notes that he has been experiencing early morning erections, but they do not last for an unsual amount of time.  Lab results today (04/27/2019) of CBC w/diff and CMP is as follows: all values are WNL except for HGB at 12.9. 04/27/2019 CMP is stable 04/27/2019 Reticulocytes WNL 04/27/2019 Uric acid is 5.2  On review of systems, pt reports ankle edema, staying active, eating well, morning erections, and denies back pain, belly pain, and any other symptoms.   MEDICAL HISTORY:  Past Medical History:  Diagnosis Date  . Diverticulosis   . Hyperlipidemia   . Hypertension   . Prediabetes   . Vitamin D deficiency   Helicobacter pylori +ve   SURGICAL HISTORY: Past Surgical History:  Procedure Laterality Date  . NO PAST SURGERIES      SOCIAL HISTORY: Social History   Socioeconomic History  . Marital status: Married    Spouse name: Not on file  . Number of children: 5  . Years of education: Not on file  . Highest education level: Not on file  Occupational History  . Occupation: retired  SScientific laboratory technician . Financial resource strain: Not on file  . Food insecurity    Worry: Not on file    Inability: Not on file  . Transportation needs    Medical: Not on file    Non-medical: Not on file  Tobacco Use  . Smoking status: Former Smoker    Types: Cigarettes    Quit date: 01/17/2003    Years since quitting: 16.2  . Smokeless tobacco: Never Used  Substance and Sexual Activity  . Alcohol use: Yes  Comment: very rare  . Drug use: No  . Sexual activity: Not on file  Lifestyle  . Physical activity    Days per week: Not on file    Minutes per session: Not on file  . Stress: Not on file  Relationships  . Social Herbalist on phone: Not on file    Gets together: Not on file    Attends religious service: Not on file    Active member of club or organization: Not on file    Attends meetings of clubs or  organizations: Not on file    Relationship status: Not on file  . Intimate partner violence    Fear of current or ex partner: Not on file    Emotionally abused: Not on file    Physically abused: Not on file    Forced sexual activity: Not on file  Other Topics Concern  . Not on file  Social History Narrative  . Not on file  Quit smoking >10 yrs ago, previously 1.5Packs per week. Started smoking in his teens. Retired. Worked as a Chief Financial Officer.   FAMILY HISTORY: Family History  Problem Relation Age of Onset  . Stroke Mother   . Early death Father        Farm/tractor accident  . Diabetes Brother   . Cirrhosis Brother   . Colon cancer Paternal Grandfather     ALLERGIES:  is allergic to penicillins and ppd [tuberculin purified protein derivative].  MEDICATIONS:  Current Outpatient Medications  Medication Sig Dispense Refill  . ALPRAZolam (XANAX) 0.5 MG tablet Take 1/2-1 tablet 2 - 3 x /day ONLY if needed for Anxiety Attack &  limit to 5 days /week to avoid addiction 90 tablet 0  . Alum & Mag Hydroxide-Simeth (MYLANTA PO) Take by mouth as needed.    Marland Kitchen amLODipine (NORVASC) 2.5 MG tablet One pill at night for blood pressure 90 tablet 2  . aspirin EC 81 MG tablet Take 81 mg by mouth daily.    . Cholecalciferol (VITAMIN D3) 5000 units TABS Take 5,000 Units by mouth daily.    . famotidine (PEPCID) 20 MG tablet Take 1 tablet (20 mg total) by mouth daily before breakfast. 30 tablet 1  . losartan-hydrochlorothiazide (HYZAAR) 100-25 MG tablet Take 1 tablet Daily for BP & Fluid 90 tablet 1  . omeprazole (PRILOSEC) 40 MG capsule Take 1 capsule (40 mg total) by mouth daily. 90 capsule 1  . pravastatin (PRAVACHOL) 40 MG tablet TAKE 1 TABLET AT BEDTIME  FOR  CHOLESTEROL (Patient taking differently: Take 40 mg by mouth at bedtime. ) 90 tablet 3  . sucralfate (CARAFATE) 1 g tablet Take 1 tablet (1 g total) by mouth 4 (four) times daily -  with meals and at bedtime. 120 tablet 1  . triamcinolone  (NASACORT) 55 MCG/ACT AERO nasal inhaler Place 2 sprays into the nose at bedtime. 1 Inhaler 3  . VENCLEXTA 100 MG TABS TAKE 4 TABLETS (400 MG) BY MOUTH DAILY. TAKE WITH FOOD AND WATER 120 tablet 2   No current facility-administered medications for this visit.     REVIEW OF SYSTEMS:    A 10+ POINT REVIEW OF SYSTEMS WAS OBTAINED including neurology, dermatology, psychiatry, cardiac, respiratory, lymph, extremities, GI, GU, Musculoskeletal, constitutional, breasts, reproductive, HEENT.  All pertinent positives are noted in the HPI.  All others are negative.   PHYSICAL EXAMINATION: ECOG PERFORMANCE STATUS: 1 - Symptomatic but completely ambulatory  Vitals:   04/27/19 0908  BP: 136/69  Pulse: 68  Resp: 18  Temp: 98.2 F (36.8 C)  SpO2: 100%   Filed Weights   04/27/19 0908  Weight: 181 lb 9.6 oz (82.4 kg)   .Body mass index is 25.33 kg/m.  GENERAL:alert, in no acute distress and comfortable SKIN: no acute rashes, no significant lesions EYES: conjunctiva are pink and non-injected, sclera anicteric OROPHARYNX: MMM, no exudates, no oropharyngeal erythema or ulceration NECK: supple, no JVD LYMPH:  no palpable lymphadenopathy in the cervical, axillary or inguinal regions LUNGS: clear to auscultation b/l with normal respiratory effort HEART: regular rate & rhythm ABDOMEN:  normoactive bowel sounds , non tender, not distended. No palpable hepatosplenomegaly.  Extremity: +1 pedal edema PSYCH: alert & oriented x 3 with fluent speech NEURO: no focal motor/sensory deficits   LABORATORY DATA:  I have reviewed the data as listed  . CBC Latest Ref Rng & Units 04/27/2019 03/31/2019 03/03/2019  WBC 4.0 - 10.5 K/uL 4.0 3.6(L) 3.7(L)  Hemoglobin 13.0 - 17.0 g/dL 12.9(L) 13.2 12.2(L)  Hematocrit 39.0 - 52.0 % 40.1 40.1 38.5(L)  Platelets 150 - 400 K/uL 154 163 141(L)   . CBC    Component Value Date/Time   WBC 4.0 04/27/2019 0853   RBC 4.36 04/27/2019 0853   RBC 4.36 04/27/2019 0853   HGB  12.9 (L) 04/27/2019 0853   HGB 12.2 (L) 01/03/2019 1509   HGB 14.5 10/21/2016 1324   HCT 40.1 04/27/2019 0853   HCT 43.8 10/21/2016 1324   PLT 154 04/27/2019 0853   PLT 152 01/03/2019 1509   PLT 157 10/21/2016 1324   MCV 92.0 04/27/2019 0853   MCV 90.9 10/21/2016 1324   MCH 29.6 04/27/2019 0853   MCHC 32.2 04/27/2019 0853   RDW 13.2 04/27/2019 0853   RDW 13.3 10/21/2016 1324   LYMPHSABS 1.2 04/27/2019 0853   LYMPHSABS 17.6 (H) 10/21/2016 1324   MONOABS 0.5 04/27/2019 0853   MONOABS 0.6 10/21/2016 1324   EOSABS 0.0 04/27/2019 0853   EOSABS 0.0 10/21/2016 1324   BASOSABS 0.0 04/27/2019 0853   BASOSABS 0.1 10/21/2016 1324     . CMP Latest Ref Rng & Units 03/31/2019 03/03/2019 02/18/2019  Glucose 70 - 99 mg/dL 91 114(H) 112(H)  BUN 8 - 23 mg/dL _0 Creatinine 0.61 - 1.24 mg/dL 1.27(H) 1.23 1.15  Sodium 135 - 145 mmol/L 140 141 140  Potassium 3.5 - 5.1 mmol/L 3.6 3.7 3.6  Chloride 98 - 111 mmol/L 108 109 105  CO2 22 - 32 mmol/L _1 Calcium 8.9 - 10.3 mg/dL 9.2 9.2 9.3  Total Protein 6.5 - 8.1 g/dL 6.7 6.2(L) 6.7  Total Bilirubin 0.3 - 1.2 mg/dL 0.5 0.4 0.5  Alkaline Phos 38 - 126 U/L 71 59 64  AST 15 - 41 U/L 13(L) 12(L) 14(L)  ALT 0 - 44 U/L _2 . Lab Results  Component Value Date   LDH 172 02/03/2019         RADIOGRAPHIC STUDIES: I have personally reviewed the radiological images as listed and agreed with the findings in the report. No results found.  ASSESSMENT & PLAN:   78 y.o. 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 FISH panel showed p53 (17p13) deletion that would be consistent with this diagnosis . It often represents also be more aggressive form of CLL Differential diagnosis includes - CD5 neg CLL vs splenic lymphoma vs Marginal Zone lymphoma vs LPL (lymphoplasmacytic lymphoma) LDH  WNL suggests against a high grade process. Currently no anemia or thrombocytopenia noted. Now with  drenching night sweats and fatigue, no fevers/chills.  10/31/16 PET/CT scan shows some small hypermetabolic nodes in the thorax including bilateral hilar paratracheal) esophageal. These would be difficult to biopsy.  Lost to follow up between 11/06/16 and 11/19/18  12/01/18 PET/CT revealed Stable scattered small hypermetabolic thoracic lymph nodes, once again at [place] 5 activity level. 2. No findings of involvement in the neck, abdomen/pelvis, or skeleton. 3. Prostatomegaly. 4. Aortic Atherosclerosis and Emphysema 5. Sigmoid colon diverticulosis  PLAN -Discussed pt labwork today, 04/27/2019; blood counts are good -The pt has no prohibitive toxicities from continuing C4D1 Gazyva and 465m Venetoclax at this time. -Plan for total of 6 months of Gazyva -Recommend elevating legs, using compression socks, and minimizing salt intake to help with ankle edema -Recommend using condoms during intercourse to prevent exposing partner to medication through semen -Will see the pt back in 4 weeks   2) . Patient Active Problem List   Diagnosis Date Noted  . Counseling regarding advance care planning and goals of care 01/24/2019  . Anxiety 02/08/2018  . CLL (chronic lymphocytic leukemia) ([place] 11/01/2016  . Encounter for Medicare annual wellness exam 09/11/2015  . GERD  02/09/2015  . Medication management 11/02/2013  . Hyperlipidemia   . Hypertension   . Abnormal glucose   . Vitamin D deficiency   . Diverticulosis    -f/u with PCP for management of other medical co-morbids  3) H.pylori gastritis -f/u with GI to ensure eradication of h.pylori post treatment.   RTC with labs, MD and next dose of Gazyva in 4 weeks.   All of the patients questions were answered with apparent satisfaction. The patient knows to call the clinic with any problems, questions or concerns.  The total time spent in the appt was 20 minutes and more than 50% was on counseling and direct patient cares.   GSullivan LoneMD MS  AAHIVMS SBaptist HospitalCRiverside Medical CenterHematology/Oncology Physician [facility] (Office):       [phone](Work cell):  [phone](Fax):           [phone] 04/27/2019 9:56 AM  I, EDe Burrs am acting as a scribe for Dr. KIrene Limbo .I have reviewed the above documentation for accuracy and completeness, and I agree with the above. .Brunetta GeneraMD

## 2019-04-27 ENCOUNTER — Other Ambulatory Visit: Payer: Self-pay

## 2019-04-27 ENCOUNTER — Telehealth: Payer: Self-pay | Admitting: Hematology

## 2019-04-27 ENCOUNTER — Inpatient Hospital Stay (HOSPITAL_BASED_OUTPATIENT_CLINIC_OR_DEPARTMENT_OTHER): Payer: Medicare HMO | Admitting: Hematology

## 2019-04-27 ENCOUNTER — Inpatient Hospital Stay: Payer: Medicare HMO

## 2019-04-27 VITALS — BP 151/83 | HR 59 | Temp 97.8°F | Resp 18

## 2019-04-27 VITALS — BP 136/69 | HR 68 | Temp 98.2°F | Resp 18 | Ht 71.0 in | Wt 181.6 lb

## 2019-04-27 DIAGNOSIS — Z5112 Encounter for antineoplastic immunotherapy: Secondary | ICD-10-CM | POA: Diagnosis not present

## 2019-04-27 DIAGNOSIS — Z87891 Personal history of nicotine dependence: Secondary | ICD-10-CM | POA: Diagnosis not present

## 2019-04-27 DIAGNOSIS — Z9189 Other specified personal risk factors, not elsewhere classified: Secondary | ICD-10-CM

## 2019-04-27 DIAGNOSIS — Z7189 Other specified counseling: Secondary | ICD-10-CM

## 2019-04-27 DIAGNOSIS — J439 Emphysema, unspecified: Secondary | ICD-10-CM | POA: Diagnosis not present

## 2019-04-27 DIAGNOSIS — R61 Generalized hyperhidrosis: Secondary | ICD-10-CM

## 2019-04-27 DIAGNOSIS — Z7982 Long term (current) use of aspirin: Secondary | ICD-10-CM | POA: Diagnosis not present

## 2019-04-27 DIAGNOSIS — I700 Atherosclerosis of aorta: Secondary | ICD-10-CM | POA: Diagnosis not present

## 2019-04-27 DIAGNOSIS — C9110 Chronic lymphocytic leukemia of B-cell type not having achieved remission: Secondary | ICD-10-CM

## 2019-04-27 DIAGNOSIS — Z79899 Other long term (current) drug therapy: Secondary | ICD-10-CM

## 2019-04-27 DIAGNOSIS — I10 Essential (primary) hypertension: Secondary | ICD-10-CM

## 2019-04-27 LAB — CBC WITH DIFFERENTIAL/PLATELET
Abs Immature Granulocytes: 0.01 10*3/uL (ref 0.00–0.07)
Basophils Absolute: 0 10*3/uL (ref 0.0–0.1)
Basophils Relative: 0 %
Eosinophils Absolute: 0 10*3/uL (ref 0.0–0.5)
Eosinophils Relative: 0 %
HCT: 40.1 % (ref 39.0–52.0)
Hemoglobin: 12.9 g/dL — ABNORMAL LOW (ref 13.0–17.0)
Immature Granulocytes: 0 %
Lymphocytes Relative: 30 %
Lymphs Abs: 1.2 10*3/uL (ref 0.7–4.0)
MCH: 29.6 pg (ref 26.0–34.0)
MCHC: 32.2 g/dL (ref 30.0–36.0)
MCV: 92 fL (ref 80.0–100.0)
Monocytes Absolute: 0.5 10*3/uL (ref 0.1–1.0)
Monocytes Relative: 14 %
Neutro Abs: 2.2 10*3/uL (ref 1.7–7.7)
Neutrophils Relative %: 56 %
Platelets: 154 10*3/uL (ref 150–400)
RBC: 4.36 MIL/uL (ref 4.22–5.81)
RDW: 13.2 % (ref 11.5–15.5)
WBC: 4 10*3/uL (ref 4.0–10.5)
nRBC: 0 % (ref 0.0–0.2)

## 2019-04-27 LAB — CMP (CANCER CENTER ONLY)
ALT: 8 U/L (ref 0–44)
AST: 12 U/L — ABNORMAL LOW (ref 15–41)
Albumin: 3.5 g/dL (ref 3.5–5.0)
Alkaline Phosphatase: 64 U/L (ref 38–126)
Anion gap: 10 (ref 5–15)
BUN: 17 mg/dL (ref 8–23)
CO2: 22 mmol/L (ref 22–32)
Calcium: 9.3 mg/dL (ref 8.9–10.3)
Chloride: 110 mmol/L (ref 98–111)
Creatinine: 1.05 mg/dL (ref 0.61–1.24)
GFR, Est AFR Am: >60 mL/min (ref >60)
GFR, Estimated: >60 mL/min (ref >60)
Glucose, Bld: 103 mg/dL — ABNORMAL HIGH (ref 70–99)
Potassium: 3.6 mmol/L (ref 3.5–5.1)
Sodium: 142 mmol/L (ref 135–145)
Total Bilirubin: 0.5 mg/dL (ref 0.3–1.2)
Total Protein: 6.3 g/dL — ABNORMAL LOW (ref 6.5–8.1)

## 2019-04-27 LAB — URIC ACID: Uric Acid, Serum: 5.1 mg/dL (ref 3.7–8.6)

## 2019-04-27 LAB — RETICULOCYTES
Immature Retic Fract: 15.5 % (ref 2.3–15.9)
RBC.: 4.36 MIL/uL (ref 4.22–5.81)
Retic Count, Absolute: 47.1 10*3/uL (ref 19.0–186.0)
Retic Ct Pct: 1.1 % (ref 0.4–3.1)

## 2019-04-27 MED ORDER — FAMOTIDINE IN NACL 20-0.9 MG/50ML-% IV SOLN
INTRAVENOUS | Status: AC
  Filled 2019-04-27: qty 50

## 2019-04-27 MED ORDER — DIPHENHYDRAMINE HCL 50 MG/ML IJ SOLN
50.0000 mg | Freq: Once | INTRAMUSCULAR | Status: AC
  Administered 2019-04-27: 50 mg via INTRAVENOUS

## 2019-04-27 MED ORDER — DIPHENHYDRAMINE HCL 50 MG/ML IJ SOLN
INTRAMUSCULAR | Status: AC
  Filled 2019-04-27: qty 1

## 2019-04-27 MED ORDER — FAMOTIDINE IN NACL 20-0.9 MG/50ML-% IV SOLN
20.0000 mg | Freq: Once | INTRAVENOUS | Status: AC
  Administered 2019-04-27: 20 mg via INTRAVENOUS

## 2019-04-27 MED ORDER — ACETAMINOPHEN 325 MG PO TABS
ORAL_TABLET | ORAL | Status: AC
  Filled 2019-04-27: qty 2

## 2019-04-27 MED ORDER — SODIUM CHLORIDE 0.9 % IV SOLN
20.0000 mg | Freq: Once | INTRAVENOUS | Status: AC
  Administered 2019-04-27: 11:00:00 20 mg via INTRAVENOUS
  Filled 2019-04-27: qty 2

## 2019-04-27 MED ORDER — SODIUM CHLORIDE 0.9 % IV SOLN
Freq: Once | INTRAVENOUS | Status: AC
  Administered 2019-04-27: 10:00:00 via INTRAVENOUS
  Filled 2019-04-27: qty 250

## 2019-04-27 MED ORDER — ACETAMINOPHEN 325 MG PO TABS
650.0000 mg | ORAL_TABLET | Freq: Once | ORAL | Status: AC
  Administered 2019-04-27: 650 mg via ORAL

## 2019-04-27 MED ORDER — SODIUM CHLORIDE 0.9 % IV SOLN
1000.0000 mg | Freq: Once | INTRAVENOUS | Status: AC
  Administered 2019-04-27: 1000 mg via INTRAVENOUS
  Filled 2019-04-27: qty 40

## 2019-04-27 NOTE — Telephone Encounter (Signed)
Scheduled appt per 7/29 los.

## 2019-04-27 NOTE — Patient Instructions (Signed)
[facility] Discharge Instructions for Patients Receiving Chemotherapy  Today you received the following chemotherapy agents: Gazyva   To help prevent nausea and vomiting after your treatment, we encourage you to take your nausea medication as directed.    If you develop nausea and vomiting that is not controlled by your nausea medication, call the clinic.   BELOW ARE SYMPTOMS THAT SHOULD BE REPORTED IMMEDIATELY:  *FEVER GREATER THAN 100.5 F  *CHILLS WITH OR WITHOUT FEVER  NAUSEA AND VOMITING THAT IS NOT CONTROLLED WITH YOUR NAUSEA MEDICATION  *UNUSUAL SHORTNESS OF BREATH  *UNUSUAL BRUISING OR BLEEDING  TENDERNESS IN MOUTH AND THROAT WITH OR WITHOUT PRESENCE OF ULCERS  *URINARY PROBLEMS  *BOWEL PROBLEMS  UNUSUAL RASH Items with * indicate a potential emergency and should be followed up as soon as possible.  Feel free to call the clinic should you have any questions or concerns. The clinic phone number is [phone].  Please show the CHEMO ALERT CARD at check-in to the Emergency Department and triage nurse.   

## 2019-04-27 NOTE — Progress Notes (Deleted)
FOLLOW UP  Assessment and Plan:   Hypertension Well controlled with current medications  Monitor blood pressure at home; patient to call if consistently greater than 130/80 Continue DASH diet.   Reminder to go to the ER if any CP, SOB, nausea, dizziness, severe HA, changes vision/speech, left arm numbness and tingling and jaw pain.  Cholesterol Currently at goal; continue pravastatin 40 mg daily  Continue low cholesterol diet and exercise.  Check lipid panel.   Prediabetes Continue diet and exercise.  Perform daily foot/skin check, notify office of any concerning changes.  Check A1C  BMI 24 Continue to recommend diet heavy in fruits and veggies and low in animal meats, cheeses, and dairy products, appropriate calorie intake Discuss exercise recommendations routinely Continue to monitor weight at each visit  Vitamin D Def At goal at last visit; continue supplementation to maintain goal of 70-100 Defer Vit D level  Anxiety Well managed by current regimen; continue medications; rare use of benzo Stress management techniques discussed, increase water, good sleep hygiene discussed, increase exercise, and increase veggies.    GERD Well managed on current medications; PPI due to esophagitis Discussed diet, avoiding triggers and other lifestyle changes Monitor magnesium, calcium, vit D  CLL Check CBC; he is overdue for follow up with Dr. Irene Limbo; # given to call and schedule He is reporting some concerning night sweats over the last month without any other notable changes.   Continue diet and meds as discussed. Further disposition pending results of labs. Discussed med's effects and SE's.   Over 30 minutes of exam, counseling, chart review, and critical decision making was performed.   Future Appointments  Date Time Provider [place]  05/02/2019 11:15 AM Vicie Mutters, PA-C [facility] None  05/26/2019  8:45 AM [facility] LAB 5 [facility] None  05/26/2019  9:20 AM  Brunetta Genera, MD [facility] None  05/26/2019 10:00 AM [facility] INFUSION [facility] None  06/22/2019  8:30 AM [facility] LAB 6 [facility] None  06/22/2019  9:00 AM Brunetta Genera, MD [facility] None  06/22/2019 10:00 AM [facility] INFUSION [facility] None    ----------------------------------------------------------------------------------------------------------------------  HPI 78 y.o. male  presents for 3 month follow up on hypertension, cholesterol, prediabetes, weight and vitamin D deficiency.   he has a diagnosis of anxiety and is currently on xanax 0.25 mg QID PRN, reports symptoms are well controlled on current regimen. he reports symptoms are improved, hasn't used in over a month.  He has a history of CLL, being following by Dr. Irene Limbo, on [place] and 400mg  Venetoclax at this time. Lab Results  Component Value Date   WBC 4.0 04/27/2019   HGB 12.9 (L) 04/27/2019   HCT 40.1 04/27/2019   MCV 92.0 04/27/2019   PLT 154 04/27/2019    BMI is There is no height or weight on file to calculate BMI., he has been working on diet and exercise. Wt Readings from Last 3 Encounters:  04/27/19 181 lb 9.6 oz (82.4 kg)  03/31/19 180 lb 6.4 oz (81.8 kg)  03/03/19 178 lb 11.2 oz (81.1 kg)   His blood pressure has been controlled at home, today their BP is    He does workout. He denies chest pain, shortness of breath, dizziness.   He is on cholesterol medication Pravastatin and denies myalgias. His cholesterol is at goal. The cholesterol last visit was:   Lab Results  Component Value Date   CHOL 188 09/15/2018   HDL 41 09/15/2018   LDLCALC 114 (H) 09/15/2018   TRIG 214 (  H) 09/15/2018   CHOLHDL 4.6 09/15/2018    He has been working on diet and exercise for prediabetes, and denies increased appetite, nausea, paresthesia of the feet, polydipsia and polyuria. Last A1C in the office was:  Lab Results  Component Value Date   HGBA1C 5.4 09/15/2018   Patient is on Vitamin D  supplement.   Lab Results  Component Value Date   VD25OH 66 06/09/2018        Current Medications:  Current Outpatient Medications on File Prior to Visit  Medication Sig  . ALPRAZolam (XANAX) 0.5 MG tablet Take 1/2-1 tablet 2 - 3 x /day ONLY if needed for Anxiety Attack &  limit to 5 days /week to avoid addiction  . Alum & Mag Hydroxide-Simeth (MYLANTA PO) Take by mouth as needed.  Marland Kitchen amLODipine (NORVASC) 2.5 MG tablet One pill at night for blood pressure  . aspirin EC 81 MG tablet Take 81 mg by mouth daily.  . Cholecalciferol (VITAMIN D3) 5000 units TABS Take 5,000 Units by mouth daily.  . famotidine (PEPCID) 20 MG tablet Take 1 tablet (20 mg total) by mouth daily before breakfast.  . losartan-hydrochlorothiazide (HYZAAR) 100-25 MG tablet Take 1 tablet Daily for BP & Fluid  . omeprazole (PRILOSEC) 40 MG capsule Take 1 capsule (40 mg total) by mouth daily.  . pravastatin (PRAVACHOL) 40 MG tablet TAKE 1 TABLET AT BEDTIME  FOR  CHOLESTEROL (Patient taking differently: Take 40 mg by mouth at bedtime. )  . sucralfate (CARAFATE) 1 g tablet Take 1 tablet (1 g total) by mouth 4 (four) times daily -  with meals and at bedtime.  . triamcinolone (NASACORT) 55 MCG/ACT AERO nasal inhaler Place 2 sprays into the nose at bedtime.  . VENCLEXTA 100 MG TABS TAKE 4 TABLETS (400 MG) BY MOUTH DAILY. TAKE WITH FOOD AND WATER   Current Facility-Administered Medications on File Prior to Visit  Medication  . acetaminophen (TYLENOL) tablet 650 mg  . dexamethasone (DECADRON) 20 mg in sodium chloride 0.9 % 50 mL IVPB  . diphenhydrAMINE (BENADRYL) injection 50 mg  . famotidine (PEPCID) IVPB 20 mg premix  . obinutuzumab (GAZYVA) 1,000 mg in sodium chloride 0.9 % 250 mL (3.4483 mg/mL) chemo infusion     Allergies:  Allergies  Allergen Reactions  . Penicillins Other (See Comments)    Unknown allergic reaction as a teenager Did it involve swelling of the face/tongue/throat, SOB, or low BP? Unknown Did it  involve sudden or severe rash/hives, skin peeling, or any reaction on the inside of your mouth or nose? Unknown Did you need to seek medical attention at a hospital or doctor's office? Unknown When did it last happen?Teenager If all above answers are "NO", may proceed with cephalosporin use.   Marland Kitchen Ppd [Tuberculin Purified Protein Derivative]     Positive test in 2004     Medical History:  Past Medical History:  Diagnosis Date  . Diverticulosis   . Hyperlipidemia   . Hypertension   . Prediabetes   . Vitamin D deficiency    Family history- Reviewed and unchanged Social history- Reviewed and unchanged   Review of Systems:  Review of Systems  Constitutional: Negative for malaise/fatigue and weight loss.  HENT: Negative for hearing loss and tinnitus.   Eyes: Negative for blurred vision and double vision.  Respiratory: Negative for cough, shortness of breath and wheezing.   Cardiovascular: Negative for chest pain, palpitations, orthopnea, claudication and leg swelling.  Gastrointestinal: Negative for abdominal pain, blood in stool,  constipation, diarrhea, heartburn, melena, nausea and vomiting.  Genitourinary: Negative.   Musculoskeletal: Negative for joint pain and myalgias.  Skin: Negative for rash.  Neurological: Negative for dizziness, tingling, sensory change, weakness and headaches.  Endo/Heme/Allergies: Negative for polydipsia.  Psychiatric/Behavioral: Negative.   All other systems reviewed and are negative.     Physical Exam: There were no vitals taken for this visit. Wt Readings from Last 3 Encounters:  04/27/19 181 lb 9.6 oz (82.4 kg)  03/31/19 180 lb 6.4 oz (81.8 kg)  03/03/19 178 lb 11.2 oz (81.1 kg)   General Appearance: Well nourished, in no apparent distress. Eyes: PERRLA, EOMs, conjunctiva no swelling or erythema Sinuses: No Frontal/maxillary tenderness ENT/Mouth: Ext aud canals clear, TMs without erythema, bulging. No erythema, swelling, or exudate on  post pharynx.  Tonsils not swollen or erythematous. Hearing normal.  Neck: Supple, thyroid normal.  Respiratory: Respiratory effort normal, BS equal bilaterally without rales, rhonchi, wheezing or stridor.  Cardio: RRR with no MRGs. Brisk peripheral pulses without edema.  Abdomen: Soft, + BS.  Non tender, no guarding, rebound, hernias, masses. Lymphatics: Non tender without lymphadenopathy.  Musculoskeletal: Full ROM, 5/5 strength, Normal gait Skin: Warm, dry without rashes, lesions, ecchymosis.  Neuro: Cranial nerves intact. No cerebellar symptoms.  Psych: Awake and oriented X 3, normal affect, Insight and Judgment appropriate.    Vicie Mutters, PA-C 10:41 AM [facility]

## 2019-04-28 ENCOUNTER — Ambulatory Visit: Payer: Medicare HMO

## 2019-04-28 ENCOUNTER — Other Ambulatory Visit: Payer: Medicare HMO

## 2019-04-28 ENCOUNTER — Ambulatory Visit: Payer: Medicare HMO | Admitting: Hematology

## 2019-05-02 ENCOUNTER — Ambulatory Visit: Payer: Medicare HMO | Admitting: Physician Assistant

## 2019-05-05 MED FILL — VENCLEXTA 100 MG TABS: 100 | 30 days supply | Qty: 120 | Fill #0

## 2019-05-11 NOTE — Progress Notes (Signed)
FOLLOW UP  Assessment and Plan:   Essential hypertension Take the hyzaar int he AM If still having issues with nocturia contact the office -     CBC with Differential/Platelet -     COMPLETE METABOLIC PANEL WITH GFR -     TSH  Mixed hyperlipidemia check lipids decrease fatty foods increase activity.  -     Lipid panel  Medication management -     Magnesium  Vitamin D deficiency -     VITAMIN D 25 Hydroxy (Vit-D Deficiency, Fractures)  CLL (chronic lymphocytic leukemia) (HCC) Follow up [place] -     CBC with Differential/Platelet  History of Helicobacter pylori infection -     Helicobacter pylori special antigen; Future - stop prilosec x 2 weeks, continue pepcid and carafate AS needed - follow up GI but can try this first.  May want to repeat AB [place] pending labs Please go to the ER if you have any severe AB pain, unable to hold down food/water, blood in stool or vomit, chest pain, shortness of breath, or any worsening symptoms.      Continue diet and meds as discussed. Further disposition pending results of labs. Discussed med's effects and SE's.   Over 30 minutes of exam, counseling, chart review, and critical decision making was performed.   Future Appointments  Date Time Provider [place]  05/26/2019  8:45 AM [facility] LAB 5 [facility] None  05/26/2019  9:20 AM Brunetta Genera, MD [facility] None  05/26/2019 10:00 AM [facility] INFUSION [facility] None  06/22/2019  8:30 AM [facility] LAB 6 [facility] None  06/22/2019  9:00 AM Brunetta Genera, MD [facility] None  06/22/2019 10:00 AM [facility] INFUSION [facility] None    ----------------------------------------------------------------------------------------------------------------------  HPI 78 y.o. male  presents for 3 month follow up on hypertension, cholesterol, prediabetes, weight and vitamin D deficiency.   He states he is getting up to pee 2-3 x a night which is more than usual x 2  months. No weak stream, no dribbling. He is taking his Hyzaar at night with HCTZ 25 mg in it.   He states he feels bloated a lot at night, feels lower AB is larger, will pass more gas. Very rare to have sharp LLQ pain lasts seconds, other wise no AB pain. Occ diarrhea but no blood in stool, black stool, abnorrnal shape. He states he is burping a lot at night, he is on carafate 4 x a day, pepcid and prilosec. Was treated for + Hyplori.  Had EGD 04/2016 + Hpylori.  Colonoscopy 2015 [place] 2017 no stones, normal gallbladder  He has a history of CLL, being following by Dr. Irene Limbo, on [place] and 400mg  Venetoclax at this time. Lab Results  Component Value Date   WBC 4.0 04/27/2019   HGB 12.9 (L) 04/27/2019   HCT 40.1 04/27/2019   MCV 92.0 04/27/2019   PLT 154 04/27/2019    BMI is Body mass index is 25.24 kg/m., he has been working on diet and exercise. Wt Readings from Last 3 Encounters:  05/12/19 181 lb (82.1 kg)  04/27/19 181 lb 9.6 oz (82.4 kg)  03/31/19 180 lb 6.4 oz (81.8 kg)   His blood pressure has been controlled at home, today their BP is    He does workout. He denies chest pain, shortness of breath, dizziness.   He is on cholesterol medication Pravastatin and denies myalgias. His cholesterol is at goal. The cholesterol last visit was:   Lab Results  Component Value Date  CHOL 188 09/15/2018   HDL 41 09/15/2018   LDLCALC 114 (H) 09/15/2018   TRIG 214 (H) 09/15/2018   CHOLHDL 4.6 09/15/2018    He has been working on diet and exercise for prediabetes, and denies increased appetite, nausea, paresthesia of the feet, polydipsia and polyuria. Last A1C in the office was:  Lab Results  Component Value Date   HGBA1C 5.4 09/15/2018   Patient is on Vitamin D supplement.   Lab Results  Component Value Date   VD25OH 66 06/09/2018        Current Medications:  Current Outpatient Medications on File Prior to Visit  Medication Sig  . ALPRAZolam (XANAX) 0.5 MG tablet Take 1/2-1  tablet 2 - 3 x /day ONLY if needed for Anxiety Attack &  limit to 5 days /week to avoid addiction  . Alum & Mag Hydroxide-Simeth (MYLANTA PO) Take by mouth as needed.  Marland Kitchen amLODipine (NORVASC) 2.5 MG tablet One pill at night for blood pressure  . aspirin EC 81 MG tablet Take 81 mg by mouth daily.  . Cholecalciferol (VITAMIN D3) 5000 units TABS Take 5,000 Units by mouth daily.  . famotidine (PEPCID) 20 MG tablet Take 1 tablet (20 mg total) by mouth daily before breakfast.  . losartan-hydrochlorothiazide (HYZAAR) 100-25 MG tablet Take 1 tablet Daily for BP & Fluid  . omeprazole (PRILOSEC) 40 MG capsule Take 1 capsule (40 mg total) by mouth daily.  . pravastatin (PRAVACHOL) 40 MG tablet TAKE 1 TABLET AT BEDTIME  FOR  CHOLESTEROL (Patient taking differently: Take 40 mg by mouth at bedtime. )  . sucralfate (CARAFATE) 1 g tablet Take 1 tablet (1 g total) by mouth 4 (four) times daily -  with meals and at bedtime.  . triamcinolone (NASACORT) 55 MCG/ACT AERO nasal inhaler Place 2 sprays into the nose at bedtime.  . VENCLEXTA 100 MG TABS TAKE 4 TABLETS (400 MG) BY MOUTH DAILY. TAKE WITH FOOD AND WATER   No current facility-administered medications on file prior to visit.      Allergies:  Allergies  Allergen Reactions  . Penicillins Other (See Comments)    Unknown allergic reaction as a teenager Did it involve swelling of the face/tongue/throat, SOB, or low BP? Unknown Did it involve sudden or severe rash/hives, skin peeling, or any reaction on the inside of your mouth or nose? Unknown Did you need to seek medical attention at a hospital or doctor's office? Unknown When did it last happen?Teenager If all above answers are "NO", may proceed with cephalosporin use.   Marland Kitchen Ppd [Tuberculin Purified Protein Derivative]     Positive test in 2004     Medical History:  Past Medical History:  Diagnosis Date  . Diverticulosis   . Hyperlipidemia   . Hypertension   . Prediabetes   . Vitamin D  deficiency    Family history- Reviewed and unchanged Social history- Reviewed and unchanged   Review of Systems:  Review of Systems  Constitutional: Negative for chills, diaphoresis, fever, malaise/fatigue and weight loss.  HENT: Negative.  Negative for hearing loss and tinnitus.   Eyes: Negative.  Negative for blurred vision and double vision.  Respiratory: Negative for cough, sputum production, shortness of breath and wheezing.   Cardiovascular: Negative for chest pain, palpitations, orthopnea, claudication, leg swelling and PND.  Gastrointestinal: Negative for abdominal pain, blood in stool, constipation, diarrhea, heartburn, melena, nausea and vomiting.       Belching, burping; occasional bloating  Genitourinary: Negative.   Musculoskeletal: Negative for  back pain, joint pain and myalgias.  Skin: Negative for rash.  Neurological: Negative for dizziness, tingling, sensory change, focal weakness, weakness and headaches.  Endo/Heme/Allergies: Negative for polydipsia. Does not bruise/bleed easily.  Psychiatric/Behavioral: Negative.  Negative for depression, substance abuse and suicidal ideas. The patient is not nervous/anxious and does not have insomnia.   All other systems reviewed and are negative.     Physical Exam: Pulse 64   Temp 97.7 F (36.5 C)   Ht 5\' 11"  (1.803 m)   Wt 181 lb (82.1 kg)   SpO2 97%   BMI 25.24 kg/m  Wt Readings from Last 3 Encounters:  05/12/19 181 lb (82.1 kg)  04/27/19 181 lb 9.6 oz (82.4 kg)  03/31/19 180 lb 6.4 oz (81.8 kg)   General Appearance: Well nourished, in no apparent distress. Eyes: PERRLA, conjunctiva no swelling or erythema ENT/Mouth:  No erythema, swelling, or exudate on post pharynx.  Tonsils not swollen or erythematous. Hearing normal.  Neck: Supple, thyroid normal.  Respiratory: Respiratory effort normal, BS equal bilaterally without rales, rhonchi, wheezing or stridor.  Cardio: RRR with no MRGs. Brisk peripheral pulses without  edema.  Abdomen: Soft, + BS.  + epigastric and RUQ tenderness, negative murphys, no guarding, rebound, hernias, palpable masses. Lymphatics: Non tender without lymphadenopathy.  Musculoskeletal: No obvious deformity, Symmetrical strength, normal gait.  Skin: Warm, dry without rashes, lesions, ecchymosis.  Neuro: Normal muscle tone,  Psych: Awake and oriented X 3, normal affect, Insight and Judgment appropriate.      Vicie Mutters, PA-C 4:24 PM [facility]

## 2019-05-12 ENCOUNTER — Encounter: Payer: Self-pay | Admitting: Physician Assistant

## 2019-05-12 ENCOUNTER — Other Ambulatory Visit: Payer: Self-pay

## 2019-05-12 ENCOUNTER — Ambulatory Visit (INDEPENDENT_AMBULATORY_CARE_PROVIDER_SITE_OTHER): Payer: Medicare HMO | Admitting: Physician Assistant

## 2019-05-12 VITALS — HR 64 | Temp 97.7°F | Ht 71.0 in | Wt 181.0 lb

## 2019-05-12 DIAGNOSIS — Z79899 Other long term (current) drug therapy: Secondary | ICD-10-CM

## 2019-05-12 DIAGNOSIS — C9110 Chronic lymphocytic leukemia of B-cell type not having achieved remission: Secondary | ICD-10-CM

## 2019-05-12 DIAGNOSIS — E782 Mixed hyperlipidemia: Secondary | ICD-10-CM | POA: Diagnosis not present

## 2019-05-12 DIAGNOSIS — E559 Vitamin D deficiency, unspecified: Secondary | ICD-10-CM

## 2019-05-12 DIAGNOSIS — R7309 Other abnormal glucose: Secondary | ICD-10-CM | POA: Diagnosis not present

## 2019-05-12 DIAGNOSIS — Z8619 Personal history of other infectious and parasitic diseases: Secondary | ICD-10-CM

## 2019-05-12 DIAGNOSIS — I10 Essential (primary) hypertension: Secondary | ICD-10-CM | POA: Diagnosis not present

## 2019-05-12 NOTE — Patient Instructions (Addendum)
Take the Hyzaar or losartan HCTZ in the morning This has a fluid pill that makes you pee a lot in it but if this does not help let us know.    Please see if you are still on the amlodipine medication for blood pressure, I have taken it off your list BUT if you have still been taking it let us know.   Stop the prilosec or omeprazole x 2 weeks THEN bring [place] a stool samples so we can check to see if the H pylori or stomach infection was every dealt with in 2017.   Continue the famotidine and take the carafate AS needed up to 4 x a day for heart burn   If you are until to get off the prilosec x 2 weeks and give [place] a stool sample we will refer you to GI  Do not take pepto during this time Try to take the carafate sparingly.   Please go to the ER if you have any severe AB pain, unable to hold down food/water, blood in stool or vomit, chest pain, shortness of breath, or any worsening symptoms.    Gastritis, Adult Gastritis is inflammation of the stomach. There are two kinds of gastritis:  Acute gastritis. This kind develops suddenly.  Chronic gastritis. This kind is much more common and lasts for a long time. Gastritis happens when the lining of the stomach becomes weak or gets damaged. Without treatment, gastritis can lead to stomach bleeding and ulcers. What are the causes? This condition may be caused by:  An infection.  Drinking too much alcohol.  Certain medicines. These include steroids, antibiotics, and some over-the-counter medicines, such as aspirin or ibuprofen.  Having too much acid in the stomach.  A disease of the intestines or stomach.  Stress.  An allergic reaction.  Crohn's disease.  Some cancer treatments (radiation). Sometimes the cause of this condition is not known. What are the signs or symptoms? Symptoms of this condition include:  Pain or a burning sensation in the upper abdomen.  Nausea.  Vomiting.  An uncomfortable feeling of fullness after  eating.  Weight loss.  Bad breath.  Blood in your vomit or stools. In some cases, there are no symptoms. How is this diagnosed? This condition may be diagnosed with:  Your medical history and a description of your symptoms.  A physical exam.  Tests. These can include: ? Blood tests. ? Stool tests. ? A test in which a thin, flexible instrument with a light and a camera is passed down the esophagus and into the stomach (upper endoscopy). ? A test in which a sample of tissue is taken for testing (biopsy). How is this treated? This condition may be treated with medicines. The medicines that are used vary depending on the cause of the gastritis:  If the condition is caused by a bacterial infection, you may be given antibiotic medicines.  If the condition is caused by too much acid in the stomach, you may be given medicines called H2 blockers, proton pump inhibitors, or antacids. Treatment may also involve stopping the use of certain medicines, such as aspirin, ibuprofen, or other NSAIDs. Follow these instructions at home: Medicines  Take over-the-counter and prescription medicines only as told by your health care provider.  If you were prescribed an antibiotic medicine, take it as told by your health care provider. Do not stop taking the antibiotic even if you start to feel better. Eating and drinking   Eat small, frequent meals instead  of large meals.  Avoid foods and drinks that make your symptoms worse.  Drink enough fluid to keep your urine pale yellow. Alcohol use  Do not drink alcohol if: ? Your health care provider tells you not to drink. ? You are pregnant, may be pregnant, or are planning to become pregnant.  If you drink alcohol: ? Limit your use to:  0-1 drink a day for women.  0-2 drinks a day for men. ? Be aware of how much alcohol is in your drink. In the [place] one drink equals one 12 oz bottle of beer (355 mL), one 5 oz glass of wine (148 mL), or one 1  oz glass of hard liquor (44 mL). General instructions  Talk with your health care provider about ways to manage stress, such as getting regular exercise or practicing deep breathing, meditation, or yoga.  Do not use any products that contain nicotine or tobacco, such as cigarettes and e-cigarettes. If you need help quitting, ask your health care provider.  Keep all follow-up visits as told by your health care provider. This is important. Contact a health care provider if:  Your symptoms get worse.  Your symptoms return after treatment. Get help right away if:  You vomit blood or material that looks like coffee grounds.  You have black or dark red stools.  You are unable to keep fluids down.  Your abdominal pain gets worse.  You have a fever.  You do not feel better after one week. Summary  Gastritis is inflammation of the lining of the stomach that can occur suddenly (acute) or develop slowly over time (chronic).  This condition is diagnosed with a medical history, a physical exam, or tests.  This condition may be treated with medicines to treat infection or medicines to reduce the amount of acid in your stomach.  Follow your health care provider's instructions about taking medicines, making changes to your diet, and knowing when to call for help. This information is not intended to replace advice given to you by your health care provider. Make sure you discuss any questions you have with your health care provider. Document Released: 09/09/2001 Document Revised: 02/02/2018 Document Reviewed: 02/02/2018 Elsevier Patient Education  2020 [employer].

## 2019-05-13 LAB — CBC WITH DIFFERENTIAL/PLATELET
Absolute Monocytes: 499 cells/uL (ref 200–950)
Basophils Absolute: 9 cells/uL (ref 0–200)
Basophils Relative: 0.2 %
Eosinophils Absolute: 0 cells/uL — ABNORMAL LOW (ref 15–500)
Eosinophils Relative: 0 %
HCT: 39.9 % (ref 38.5–50.0)
Hemoglobin: 13.3 g/dL (ref 13.2–17.1)
Lymphs Abs: 1226 cells/uL (ref 850–3900)
MCH: 30.2 pg (ref 27.0–33.0)
MCHC: 33.3 g/dL (ref 32.0–36.0)
MCV: 90.5 fL (ref 80.0–100.0)
MPV: 11.6 fL (ref 7.5–12.5)
Monocytes Relative: 11.6 %
Neutro Abs: 2567 cells/uL (ref 1500–7800)
Neutrophils Relative %: 59.7 %
Platelets: 130 10*3/uL — ABNORMAL LOW (ref 140–400)
RBC: 4.41 10*6/uL (ref 4.20–5.80)
RDW: 13 % (ref 11.0–15.0)
Total Lymphocyte: 28.5 %
WBC: 4.3 10*3/uL (ref 3.8–10.8)

## 2019-05-13 LAB — LIPID PANEL
Cholesterol: 170 mg/dL (ref <200)
HDL: 47 mg/dL (ref >=40)
LDL Cholesterol (Calc): 105 mg/dL (calc) — ABNORMAL HIGH
Non-HDL Cholesterol (Calc): 123 mg/dL (calc) (ref <130)
Total CHOL/HDL Ratio: 3.6 (calc) (ref <5.0)
Triglycerides: 85 mg/dL (ref <150)

## 2019-05-13 LAB — COMPLETE METABOLIC PANEL WITH GFR
AG Ratio: 1.9 (calc) (ref 1.0–2.5)
ALT: 9 U/L (ref 9–46)
AST: 11 U/L (ref 10–35)
Albumin: 4 g/dL (ref 3.6–5.1)
Alkaline phosphatase (APISO): 60 U/L (ref 35–144)
BUN: 16 mg/dL (ref 7–25)
CO2: 24 mmol/L (ref 20–32)
Calcium: 9.5 mg/dL (ref 8.6–10.3)
Chloride: 107 mmol/L (ref 98–110)
Creat: 0.97 mg/dL (ref 0.70–1.18)
GFR, Est African American: 86 mL/min/{1.73_m2} (ref >=60)
GFR, Est Non African American: 74 mL/min/{1.73_m2} (ref >=60)
Globulin: 2.1 g/dL (calc) (ref 1.9–3.7)
Glucose, Bld: 90 mg/dL (ref 65–99)
Potassium: 3.8 mmol/L (ref 3.5–5.3)
Sodium: 140 mmol/L (ref 135–146)
Total Bilirubin: 0.4 mg/dL (ref 0.2–1.2)
Total Protein: 6.1 g/dL (ref 6.1–8.1)

## 2019-05-13 LAB — VITAMIN D 25 HYDROXY (VIT D DEFICIENCY, FRACTURES): Vit D, 25-Hydroxy: 58 ng/mL (ref 30–100)

## 2019-05-13 LAB — MAGNESIUM: Magnesium: 2 mg/dL (ref 1.5–2.5)

## 2019-05-13 LAB — TSH: TSH: 0.95 mIU/L (ref 0.40–4.50)

## 2019-05-24 NOTE — Progress Notes (Signed)
HEMATOLOGY/ONCOLOGY CLINIC NOTE  Date of Service: 05/24/19   Patient Care Team: Unk Pinto, MD as PCP - General (Internal Medicine) Inda Castle, MD (Inactive) as Consulting Physician (Gastroenterology) Melissa Noon, [place] as Referring Physician (Optometry) Danis, Kirke Corin, MD as Consulting Physician (Gastroenterology) Brunetta Genera, MD as Consulting Physician (Hematology)  CHIEF COMPLAINTS/PURPOSE OF CONSULTATION:  F/u for CD5 neg CLL   HISTORY OF PRESENTING ILLNESS:   Javier [name] is a wonderful 78 y.o. male who has been referred to [place] by Dr .Unk Pinto, MD for evaluation and management of lymphocytosis concerning for a lymphoproliferative process.  Patient has a h/o HTN, HLD, Prediabetes who notes that he had significant abdominal discomfort with significant dyspeptic symptoms around aug 2017 and had about 20-25lbs weight loss and had an extensive GI workup including CT abd/pelvis 05/25/2016 - WNL, EGD which showed chronic gastritis (helicobacter pylori +ve) and colonscopy with some polyps. Capsule endoscopy was not approved by insurance. Patients symptoms improved after treatment of H.pylori and he notes that he has gained back about 6-7 lbs and is eating better.  Patients labs were noted to show some leucocytosis/Lymphocytosis for which he was referred to [place] for further evaluation.  Patient notes some nightsweats and weight loss (as noted above, possible alternative explanation). No fevers/chills. Has not noted any overt enlarged LN and CT abd in 04/2016 showed no hepatomegaly or splenomegaly and no abd/RP LNadenopathy.  Patient notes no other acute focal symptoms.   INTERVAL HISTORY  Javier [name] is here for management and evaluation of his CLL. The patient's last visit with [place] was on 04/27/2019. The pt reports that he is doing well overall.  The pt reports that his night sweats have improved. He experiences them 1-2x per night, but they do not last  as long as they used to. He experiences weakness during his night sweats as well.  The pt has been eating well and sleeping well, but notes that he feels fatigued when he spends time outdoors. Denies problems with his medications.  Lab results today (05/26/2019) of CBC w/diff is as follows: all values are WNL. 05/26/2019 Reticulocytes all WNL 05/26/2019 Uric acid is 4.8 05/26/2019 CMP is stable  On review of systems, pt reports weakness during night sweats, fatigue, and denies problems with medications, and any other symptoms.   MEDICAL HISTORY:  Past Medical History:  Diagnosis Date  . Diverticulosis   . Hyperlipidemia   . Hypertension   . Prediabetes   . Vitamin D deficiency   Helicobacter pylori +ve   SURGICAL HISTORY: Past Surgical History:  Procedure Laterality Date  . NO PAST SURGERIES      SOCIAL HISTORY: Social History   Socioeconomic History  . Marital status: Married    Spouse name: Not on file  . Number of children: 5  . Years of education: Not on file  . Highest education level: Not on file  Occupational History  . Occupation: retired  Scientific laboratory technician  . Financial resource strain: Not on file  . Food insecurity    Worry: Not on file    Inability: Not on file  . Transportation needs    Medical: Not on file    Non-medical: Not on file  Tobacco Use  . Smoking status: Former Smoker    Types: Cigarettes    Quit date: 01/17/2003    Years since quitting: 16.3  . Smokeless tobacco: Never Used  Substance and Sexual Activity  . Alcohol use: Yes  Comment: very rare  . Drug use: No  . Sexual activity: Not on file  Lifestyle  . Physical activity    Days per week: Not on file    Minutes per session: Not on file  . Stress: Not on file  Relationships  . Social Herbalist on phone: Not on file    Gets together: Not on file    Attends religious service: Not on file    Active member of club or organization: Not on file    Attends meetings of clubs  or organizations: Not on file    Relationship status: Not on file  . Intimate partner violence    Fear of current or ex partner: Not on file    Emotionally abused: Not on file    Physically abused: Not on file    Forced sexual activity: Not on file  Other Topics Concern  . Not on file  Social History Narrative  . Not on file  Quit smoking >10 yrs ago, previously 1.5Packs per week. Started smoking in his teens. Retired. Worked as a Chief Financial Officer.   FAMILY HISTORY: Family History  Problem Relation Age of Onset  . Stroke Mother   . Early death Father        Farm/tractor accident  . Diabetes Brother   . Cirrhosis Brother   . Colon cancer Paternal Grandfather     ALLERGIES:  is allergic to penicillins and ppd [tuberculin purified protein derivative].  MEDICATIONS:  Current Outpatient Medications  Medication Sig Dispense Refill  . ALPRAZolam (XANAX) 0.5 MG tablet Take 1/2-1 tablet 2 - 3 x /day ONLY if needed for Anxiety Attack &  limit to 5 days /week to avoid addiction 90 tablet 0  . Alum & Mag Hydroxide-Simeth (MYLANTA PO) Take by mouth as needed.    Marland Kitchen amLODipine (NORVASC) 2.5 MG tablet One pill at night for blood pressure 90 tablet 2  . aspirin EC 81 MG tablet Take 81 mg by mouth daily.    . Cholecalciferol (VITAMIN D3) 5000 units TABS Take 5,000 Units by mouth daily.    . famotidine (PEPCID) 20 MG tablet Take 1 tablet (20 mg total) by mouth daily before breakfast. 30 tablet 1  . losartan-hydrochlorothiazide (HYZAAR) 100-25 MG tablet Take 1 tablet Daily for BP & Fluid 90 tablet 1  . omeprazole (PRILOSEC) 40 MG capsule Take 1 capsule (40 mg total) by mouth daily. 90 capsule 1  . pravastatin (PRAVACHOL) 40 MG tablet TAKE 1 TABLET AT BEDTIME  FOR  CHOLESTEROL (Patient taking differently: Take 40 mg by mouth at bedtime. ) 90 tablet 3  . sucralfate (CARAFATE) 1 g tablet Take 1 tablet (1 g total) by mouth 4 (four) times daily -  with meals and at bedtime. 120 tablet 1  .  triamcinolone (NASACORT) 55 MCG/ACT AERO nasal inhaler Place 2 sprays into the nose at bedtime. 1 Inhaler 3  . VENCLEXTA 100 MG TABS TAKE 4 TABLETS (400 MG) BY MOUTH DAILY. TAKE WITH FOOD AND WATER 120 tablet 2   No current facility-administered medications for this visit.     REVIEW OF SYSTEMS:    A 10+ POINT REVIEW OF SYSTEMS WAS OBTAINED including neurology, dermatology, psychiatry, cardiac, respiratory, lymph, extremities, GI, GU, Musculoskeletal, constitutional, breasts, reproductive, HEENT.  All pertinent positives are noted in the HPI.  All others are negative.   PHYSICAL EXAMINATION: ECOG PERFORMANCE STATUS: 1 - Symptomatic but completely ambulatory  There were no vitals filed for this visit.  There were no vitals filed for this visit. .There is no height or weight on file to calculate BMI.   GENERAL:alert, in no acute distress and comfortable SKIN: no acute rashes, no significant lesions EYES: conjunctiva are pink and non-injected, sclera anicteric OROPHARYNX: MMM, no exudates, no oropharyngeal erythema or ulceration NECK: supple, no JVD LYMPH:  no palpable lymphadenopathy in the cervical, axillary or inguinal regions LUNGS: clear to auscultation b/l with normal respiratory effort HEART: regular rate & rhythm ABDOMEN:  normoactive bowel sounds , non tender, not distended. No palpable hepatosplenomegaly.  Extremity: minimal right pedal edema PSYCH: alert & oriented x 3 with fluent speech NEURO: no focal motor/sensory deficits   LABORATORY DATA:  I have reviewed the data as listed  . CBC Latest Ref Rng & Units 05/12/2019 04/27/2019 03/31/2019  WBC 3.8 - 10.8 Thousand/uL 4.3 4.0 3.6(L)  Hemoglobin 13.2 - 17.1 g/dL 13.3 12.9(L) 13.2  Hematocrit 38.5 - 50.0 % 39.9 40.1 40.1  Platelets 140 - 400 Thousand/uL 130(L) 154 163   . CBC    Component Value Date/Time   WBC 4.3 05/12/2019 1630   RBC 4.41 05/12/2019 1630   HGB 13.3 05/12/2019 1630   HGB 12.2 (L) 01/03/2019 1509    HGB 14.5 10/21/2016 1324   HCT 39.9 05/12/2019 1630   HCT 43.8 10/21/2016 1324   PLT 130 (L) 05/12/2019 1630   PLT 152 01/03/2019 1509   PLT 157 10/21/2016 1324   MCV 90.5 05/12/2019 1630   MCV 90.9 10/21/2016 1324   MCH 30.2 05/12/2019 1630   MCHC 33.3 05/12/2019 1630   RDW 13.0 05/12/2019 1630   RDW 13.3 10/21/2016 1324   LYMPHSABS 1,226 05/12/2019 1630   LYMPHSABS 17.6 (H) 10/21/2016 1324   MONOABS 0.5 04/27/2019 0853   MONOABS 0.6 10/21/2016 1324   EOSABS 0 (L) 05/12/2019 1630   EOSABS 0.0 10/21/2016 1324   BASOSABS 9 05/12/2019 1630   BASOSABS 0.1 10/21/2016 1324     . CMP Latest Ref Rng & Units 05/12/2019 04/27/2019 03/31/2019  Glucose 65 - 99 mg/dL 90 103(H) 91  BUN 7 - 25 mg/dL _0 Creatinine 0.70 - 1.18 mg/dL 0.97 1.05 1.27(H)  Sodium 135 - 146 mmol/L 140 142 140  Potassium 3.5 - 5.3 mmol/L 3.8 3.6 3.6  Chloride 98 - 110 mmol/L 107 110 108  CO2 20 - 32 mmol/L _1 Calcium 8.6 - 10.3 mg/dL 9.5 9.3 9.2  Total Protein 6.1 - 8.1 g/dL 6.1 6.3(L) 6.7  Total Bilirubin 0.2 - 1.2 mg/dL 0.4 0.5 0.5  Alkaline Phos 38 - 126 U/L - 64 71  AST 10 - 35 U/L 11 12(L) 13(L)  ALT 9 - 46 U/L _2 . Lab Results  Component Value Date   LDH 172 02/03/2019         RADIOGRAPHIC STUDIES: I have personally reviewed the radiological images as listed and agreed with the findings in the report. No results found.  ASSESSMENT & PLAN:   78 y.o. 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 FISH panel showed p53 (17p13) deletion that would be consistent with this diagnosis . It often represents also be more aggressive form of CLL Differential diagnosis includes - CD5 neg CLL vs splenic lymphoma vs Marginal Zone lymphoma vs LPL (lymphoplasmacytic lymphoma) LDH WNL suggests against a high grade process. Currently no anemia or thrombocytopenia noted. Now with drenching night sweats and fatigue, no fevers/chills.  10/31/16  PET/CT scan shows some small hypermetabolic nodes in the thorax including bilateral hilar paratracheal) esophageal. These would be difficult to biopsy.  Lost to follow up between 11/06/16 and 11/19/18  12/01/18 PET/CT revealed Stable scattered small hypermetabolic thoracic lymph nodes, once again at [place] 5 activity level. 2. No findings of involvement in the neck, abdomen/pelvis, or skeleton. 3. Prostatomegaly. 4. Aortic Atherosclerosis and Emphysema 5. Sigmoid colon diverticulosis  PLAN -Discussed pt labwork today, 05/26/2019; blood counts are normal -The pt has no prohibitive toxicities from continuing C5D1 Gazyva and 447m Venetoclax at this time. -Plan for total of 6 months of Gazyva -If blood counts are stable at next visit, will plan on spacing out visits to every 2 months -Recommend elevating legs, using compression socks, and minimizing salt intake to help with ankle edema -Will see the pt back on 06/22/2019   2) . Patient Active Problem List   Diagnosis Date Noted  . Counseling regarding advance care planning and goals of care 01/24/2019  . Anxiety 02/08/2018  . CLL (chronic lymphocytic leukemia) ([place] 11/01/2016  . Encounter for Medicare annual wellness exam 09/11/2015  . GERD  02/09/2015  . Medication management 11/02/2013  . Hyperlipidemia   . Hypertension   . Abnormal glucose   . Vitamin D deficiency   . Diverticulosis    -f/u with PCP for management of other medical co-morbids  3) H.pylori gastritis -f/u with GI to ensure eradication of h.pylori post treatment.   RTC as per scheduled appointments for labs, MD visit and Gazyva on 06/22/2019   All of the patients questions were answered with apparent satisfaction. The patient knows to call the clinic with any problems, questions or concerns.  The total time spent in the appt was 15 minutes and more than 50% was on counseling and direct patient cares.   GSullivan LoneMD MS AAHIVMS SMesquite Specialty HospitalCLifec[facility] - Alle-KiskiHematology/Oncology  Physician C[facility] (Office):       [phone](Work cell):  [phone](Fax):           [phone] 05/24/2019 7:18 PM  I, EDe Burrs am acting as a scribe for Dr. KIrene Limbo .I have reviewed the above documentation for accuracy and completeness, and I agree with the above. .Brunetta GeneraMD

## 2019-05-25 ENCOUNTER — Ambulatory Visit (INDEPENDENT_AMBULATORY_CARE_PROVIDER_SITE_OTHER): Payer: Medicare HMO | Admitting: Adult Health

## 2019-05-25 ENCOUNTER — Encounter: Payer: Self-pay | Admitting: Adult Health

## 2019-05-25 ENCOUNTER — Other Ambulatory Visit: Payer: Self-pay

## 2019-05-25 VITALS — BP 130/80 | HR 64 | Temp 97.7°F | Ht 71.0 in | Wt 178.0 lb

## 2019-05-25 DIAGNOSIS — R413 Other amnesia: Secondary | ICD-10-CM
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
## 2019-05-25 NOTE — Patient Instructions (Addendum)
Please get on a sublingual B12      Dementia Dementia is a condition that affects the way the brain functions. It often affects memory and thinking. Usually, dementia gets worse with time and cannot be reversed (progressive dementia). There are many types of dementia, including:  Alzheimer's disease. This type is the most common.  Vascular dementia. This type may happen as the result of a stroke.  Lewy body dementia. This type may happen to people who have Parkinson's disease.  Frontotemporal dementia. This type is caused by damage to nerve cells (neurons) in certain parts of the brain. Some people may be affected by more than one type of dementia. This is called mixed dementia. What are the causes? Dementia is caused by damage to cells in the brain. The area of the brain and the types of cells damaged determine the type of dementia. Usually, this damage is irreversible or cannot be undone. Some examples of irreversible causes include:  Conditions that affect the blood vessels of the brain, such as diabetes, heart disease, or blood vessel disease.  Genetic mutations. In some cases, changes in the brain may be caused by another condition and can be reversed or slowed. Some examples of reversible causes include:  Injury to the brain.  Certain medicines.  Infection, such as meningitis.  Metabolic problems, such as vitamin B12 deficiency or thyroid disease.  Pressure on the brain, such as from a tumor or blood clot. What are the signs or symptoms? Symptoms of dementia depend on the type of dementia. Common signs of dementia include problems with remembering, thinking, problem solving, decision making, and communicating. These signs develop slowly or get worse with time. This may include:  Problems remembering things.  Having trouble taking a bath or putting clothes on.  Forgetting appointments.  Forgetting to pay bills.  Difficulty planning and preparing meals.  Having  trouble speaking.  Getting lost easily. How is this diagnosed? This condition is diagnosed by a specialist (neurologist). It is diagnosed based on the history of your symptoms, your medical history, a physical exam, and tests. Tests may include:  Tests to evaluate brain function, such as memory tests, cognitive tests, and other tests.  Lab tests, such as blood or urine tests.  Imaging tests, such as a CT scan, a PET scan, or an MRI.  Genetic testing. This may be done if other family members have a diagnosis of certain types of dementia. Your health care provider will talk with you and your family, friends, or caregivers about your history and symptoms. How is this treated?  Treatment for this condition depends on the cause of the dementia. Progressive dementias, such as Alzheimer's disease, cannot be cured, but there may be treatments that help to manage symptoms. Treatment might involve taking medicines that may help to:  Control the dementia.  Slow down the progression of the dementia.  Manage symptoms. In some cases, treating the cause of your dementia can improve symptoms, reverse symptoms, or slow down how quickly your dementia becomes worse. Your health care provider can direct you to support groups, organizations, and other health care providers who can help with decisions about your care. Follow these instructions at home: Medicines  Take over-the-counter and prescription medicines only as told by your health care provider.  Use a pill organizer or pill reminder to help you manage your medicines.  Avoid taking medicines that can affect thinking, such as pain medicines or sleeping medicines. Lifestyle  Make healthy lifestyle choices. ?  Be physically active as told by your health care provider. ? Do not use any products that contain nicotine or tobacco, such as cigarettes, e-cigarettes, and chewing tobacco. If you need help quitting, ask your health care provider. ? Do  not drink alcohol. ? Practice stress-management techniques when you get stressed. ? Spend time with other people.  Make sure to get quality sleep. These tips can help you get a good night's rest: ? Avoid napping during the day. ? Keep your sleeping area dark and cool. ? Avoid exercising during the few hours before you go to bed. ? Avoid caffeine products in the evening. Eating and drinking  Drink enough fluid to keep your urine pale yellow.  Eat a healthy diet. General instructions   Work with your health care provider to determine what you need help with and what your safety needs are.  Talk with your health care provider about whether it is safe for you to drive.  If you were given a bracelet that identifies you as a person with memory loss or tracks your location, make sure to wear it at all times.  Work with your family to make important decisions, such as advance directives, medical power of attorney, or a living will.  Keep all follow-up visits as told by your health care provider. This is important. Where to find more information  Alzheimer's Association: CapitalMile.co.nz  National Institute on Aging: DVDEnthusiasts.nl  World Health Organization: RoleLink.com.br Contact a health care provider if:  You have any new or worsening symptoms.  You have problems with choking or swallowing. Get help right away if:  You feel depressed or sad, or feel that you want to harm yourself.  Your family members become concerned for your safety. If you ever feel like you may hurt yourself or others, or have thoughts about taking your own life, get help right away. You can go to your nearest emergency department or call:  Your local emergency services (911 in the [place]  A suicide crisis helpline, such as the [facility] at (701)353-9614. This is open 24 hours a day. Summary  Dementia is a condition that affects the way the brain functions. Dementia  often affects memory and thinking.  Usually, dementia gets worse with time and cannot be reversed (progressive dementia).  Treatment for this condition depends on the cause of the dementia.  Work with your health care provider to determine what you need help with and what your safety needs are.  Your health care provider can direct you to support groups, organizations, and other health care providers who can help with decisions about your care. This information is not intended to replace advice given to you by your health care provider. Make sure you discuss any questions you have with your health care provider. Document Released: 03/11/2001 Document Revised: 11/30/2018 Document Reviewed: 11/30/2018 Elsevier Patient Education  [place].     Memory Compensation Strategies  1. Use "WARM" strategy.  W= write it down  A= associate it  R= repeat it  M= make a mental note  2.   You can keep a Social worker.  Use a 3-ring notebook with sections for the following: calendar, important names and phone numbers,  medications, doctors' names/phone numbers, lists/reminders, and a section to journal what you did  each day.   3.    Use a calendar to write appointments down.  4.    Write yourself a schedule for the day.  This can be  placed on the calendar or in a separate section of the Memory Notebook.  Keeping a  regular schedule can help memory.  5.    Use medication organizer with sections for each day or morning/evening pills.  You may need help loading it  6.    Keep a basket, or pegboard by the door.  Place items that you need to take out with you in the basket or on the pegboard.  You may also want to  include a message board for reminders.  7.    Use sticky notes.  Place sticky notes with reminders in a place where the task is performed.  For example: " turn off the  stove" placed by the stove, "lock the door" placed on the door at eye level, " take your medications" on   the bathroom mirror or by the place where you normally take your medications.  8.    Use alarms/timers.  Use while cooking to remind yourself to check on food or as a reminder to take your medicine, or as a  reminder to make a call, or as a reminder to perform another task, etc.

## 2019-05-26 ENCOUNTER — Inpatient Hospital Stay: Payer: Medicare HMO

## 2019-05-26 ENCOUNTER — Other Ambulatory Visit: Payer: Self-pay

## 2019-05-26 ENCOUNTER — Inpatient Hospital Stay (HOSPITAL_BASED_OUTPATIENT_CLINIC_OR_DEPARTMENT_OTHER): Payer: Medicare HMO | Admitting: Hematology

## 2019-05-26 ENCOUNTER — Inpatient Hospital Stay: Payer: Medicare HMO | Attending: Hematology

## 2019-05-26 ENCOUNTER — Telehealth: Payer: Self-pay | Admitting: Hematology

## 2019-05-26 VITALS — BP 143/72 | HR 60 | Temp 97.9°F | Resp 16

## 2019-05-26 VITALS — BP 151/71 | HR 73 | Temp 98.2°F | Resp 18 | Ht 71.0 in | Wt 178.8 lb

## 2019-05-26 DIAGNOSIS — Z5112 Encounter for antineoplastic immunotherapy: Secondary | ICD-10-CM

## 2019-05-26 DIAGNOSIS — Z79899 Other long term (current) drug therapy: Secondary | ICD-10-CM | POA: Diagnosis not present

## 2019-05-26 DIAGNOSIS — C9110 Chronic lymphocytic leukemia of B-cell type not having achieved remission: Secondary | ICD-10-CM | POA: Insufficient documentation

## 2019-05-26 DIAGNOSIS — Q9389 Other deletions from the autosomes: Secondary | ICD-10-CM | POA: Diagnosis not present

## 2019-05-26 DIAGNOSIS — Z7189 Other specified counseling: Secondary | ICD-10-CM

## 2019-05-26 LAB — RETICULOCYTES
Immature Retic Fract: 13.7 % (ref 2.3–15.9)
RBC.: 4.56 MIL/uL (ref 4.22–5.81)
Retic Count, Absolute: 69.8 10*3/uL (ref 19.0–186.0)
Retic Ct Pct: 1.5 % (ref 0.4–3.1)

## 2019-05-26 LAB — CMP (CANCER CENTER ONLY)
ALT: 10 U/L (ref 0–44)
AST: 13 U/L — ABNORMAL LOW (ref 15–41)
Albumin: 3.9 g/dL (ref 3.5–5.0)
Alkaline Phosphatase: 70 U/L (ref 38–126)
Anion gap: 8 (ref 5–15)
BUN: 14 mg/dL (ref 8–23)
CO2: 24 mmol/L (ref 22–32)
Calcium: 9.6 mg/dL (ref 8.9–10.3)
Chloride: 109 mmol/L (ref 98–111)
Creatinine: 1.15 mg/dL (ref 0.61–1.24)
GFR, Est AFR Am: >60 mL/min (ref >60)
GFR, Estimated: >60 mL/min (ref >60)
Glucose, Bld: 116 mg/dL — ABNORMAL HIGH (ref 70–99)
Potassium: 3.7 mmol/L (ref 3.5–5.1)
Sodium: 141 mmol/L (ref 135–145)
Total Bilirubin: 0.8 mg/dL (ref 0.3–1.2)
Total Protein: 6.8 g/dL (ref 6.5–8.1)

## 2019-05-26 LAB — CBC WITH DIFFERENTIAL/PLATELET
Abs Immature Granulocytes: 0.02 10*3/uL (ref 0.00–0.07)
Basophils Absolute: 0 10*3/uL (ref 0.0–0.1)
Basophils Relative: 0 %
Eosinophils Absolute: 0 10*3/uL (ref 0.0–0.5)
Eosinophils Relative: 0 %
HCT: 41.7 % (ref 39.0–52.0)
Hemoglobin: 13.9 g/dL (ref 13.0–17.0)
Immature Granulocytes: 0 %
Lymphocytes Relative: 32 %
Lymphs Abs: 1.6 10*3/uL (ref 0.7–4.0)
MCH: 30.3 pg (ref 26.0–34.0)
MCHC: 33.3 g/dL (ref 30.0–36.0)
MCV: 90.8 fL (ref 80.0–100.0)
Monocytes Absolute: 0.5 10*3/uL (ref 0.1–1.0)
Monocytes Relative: 10 %
Neutro Abs: 2.8 10*3/uL (ref 1.7–7.7)
Neutrophils Relative %: 58 %
Platelets: 172 10*3/uL (ref 150–400)
RBC: 4.59 MIL/uL (ref 4.22–5.81)
RDW: 13.4 % (ref 11.5–15.5)
WBC: 4.9 10*3/uL (ref 4.0–10.5)
nRBC: 0 % (ref 0.0–0.2)

## 2019-05-26 LAB — URIC ACID: Uric Acid, Serum: 4.8 mg/dL (ref 3.7–8.6)

## 2019-05-26 MED ORDER — SODIUM CHLORIDE 0.9 % IV SOLN
Freq: Once | INTRAVENOUS | Status: AC
  Administered 2019-05-26: 11:00:00 via INTRAVENOUS
  Filled 2019-05-26: qty 250

## 2019-05-26 MED ORDER — FAMOTIDINE IN NACL 20-0.9 MG/50ML-% IV SOLN
20.0000 mg | Freq: Once | INTRAVENOUS | Status: AC
  Administered 2019-05-26: 11:00:00 20 mg via INTRAVENOUS

## 2019-05-26 MED ORDER — ACETAMINOPHEN 325 MG PO TABS
ORAL_TABLET | ORAL | Status: AC
  Filled 2019-05-26: qty 2

## 2019-05-26 MED ORDER — DIPHENHYDRAMINE HCL 50 MG/ML IJ SOLN
50.0000 mg | Freq: Once | INTRAMUSCULAR | Status: AC
  Administered 2019-05-26: 11:00:00 50 mg via INTRAVENOUS

## 2019-05-26 MED ORDER — DIPHENHYDRAMINE HCL 50 MG/ML IJ SOLN
INTRAMUSCULAR | Status: AC
  Filled 2019-05-26: qty 1

## 2019-05-26 MED ORDER — SODIUM CHLORIDE 0.9 % IV SOLN
20.0000 mg | Freq: Once | INTRAVENOUS | Status: AC
  Administered 2019-05-26: 11:00:00 20 mg via INTRAVENOUS
  Filled 2019-05-26: qty 20

## 2019-05-26 MED ORDER — SODIUM CHLORIDE 0.9 % IV SOLN
1000.0000 mg | Freq: Once | INTRAVENOUS | Status: AC
  Administered 2019-05-26: 13:00:00 1000 mg via INTRAVENOUS
  Filled 2019-05-26: qty 40

## 2019-05-26 MED ORDER — FAMOTIDINE IN NACL 20-0.9 MG/50ML-% IV SOLN
INTRAVENOUS | Status: AC
  Filled 2019-05-26: qty 50

## 2019-05-26 MED ORDER — ACETAMINOPHEN 325 MG PO TABS
650.0000 mg | ORAL_TABLET | Freq: Once | ORAL | Status: AC
  Administered 2019-05-26: 650 mg via ORAL

## 2019-05-26 NOTE — Patient Instructions (Signed)
[facility] Discharge Instructions for Patients Receiving Chemotherapy  Today you received the following chemotherapy agents:  obinutuzumab  To help prevent nausea and vomiting after your treatment, we encourage you to take your nausea medication as prescribed.    If you develop nausea and vomiting that is not controlled by your nausea medication, call the clinic.   BELOW ARE SYMPTOMS THAT SHOULD BE REPORTED IMMEDIATELY:  *FEVER GREATER THAN 100.5 F  *CHILLS WITH OR WITHOUT FEVER  NAUSEA AND VOMITING THAT IS NOT CONTROLLED WITH YOUR NAUSEA MEDICATION  *UNUSUAL SHORTNESS OF BREATH  *UNUSUAL BRUISING OR BLEEDING  TENDERNESS IN MOUTH AND THROAT WITH OR WITHOUT PRESENCE OF ULCERS  *URINARY PROBLEMS  *BOWEL PROBLEMS  UNUSUAL RASH Items with * indicate a potential emergency and should be followed up as soon as possible.  Feel free to call the clinic should you have any questions or concerns. The clinic phone number is [phone].  Please show the [place] at check-in to the Emergency Department and triage nurse.

## 2019-05-26 NOTE — Telephone Encounter (Signed)
Per 8/27 los RTC as per scheduled appointments for labs, MD visit and Gazyva on 06/22/2019

## 2019-05-30 DIAGNOSIS — Z8619 Personal history of other infectious and parasitic diseases: Secondary | ICD-10-CM | POA: Diagnosis not present

## 2019-05-30 NOTE — Addendum Note (Signed)
Addended by: Eulis Canner on: 05/30/2019 04:46 PM   Modules accepted: Orders

## 2019-05-31 LAB — HELICOBACTER PYLORI  SPECIAL ANTIGEN
MICRO NUMBER:: 830437
SPECIMEN QUALITY: ADEQUATE

## 2019-06-07 ENCOUNTER — Other Ambulatory Visit: Payer: Self-pay | Admitting: Adult Health

## 2019-06-07 ENCOUNTER — Other Ambulatory Visit: Payer: Self-pay

## 2019-06-07 MED ORDER — PRAVASTATIN SODIUM 40 MG PO TABS: ORAL_TABLET | ORAL | 3 refills | Status: DC

## 2019-06-07 MED ORDER — ALPRAZOLAM 0.5 MG PO TABS: ORAL_TABLET | ORAL | 0 refills | Status: DC

## 2019-06-13 MED FILL — VENCLEXTA 100 MG TABS: 100 | 30 days supply | Qty: 120 | Fill #1

## 2019-06-18 NOTE — Progress Notes (Signed)
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 male who has been referred to [place] by Dr .Unk Pinto, MD for evaluation and management of lymphocytosis concerning for a lymphoproliferative process.  Patient has a h/o HTN, HLD, Prediabetes who notes that he had significant abdominal discomfort with significant dyspeptic symptoms around aug 2017 and had about 20-25lbs weight loss and had an extensive GI workup including CT abd/pelvis 05/25/2016 - WNL, EGD which showed chronic gastritis (helicobacter pylori +ve) and colonscopy with some polyps. Capsule endoscopy was not approved by insurance. Patients symptoms improved after treatment of H.pylori and he notes that he has gained back about 6-7 lbs and is eating better.  Patients labs were noted to show some leucocytosis/Lymphocytosis for which he was referred to [place] for further evaluation.  Patient notes some nightsweats and weight loss (as noted above, possible alternative explanation). No fevers/chills. Has not noted any overt enlarged LN and CT abd in 04/2016 showed no hepatomegaly or splenomegaly and no abd/RP LNadenopathy.  Patient notes no other acute focal symptoms.   INTERVAL HISTORY  [name] is here for management and evaluation of his CLL. He is here for C6D1 of Gazyva. The patient's last visit with [place] was on 05/26/2019. The pt reports that he is doing well overall.  The pt reports that he has still has night sweats that wake him up but they  are improving. He also notes occasional weakness and abdominal pain and some intermittent leg swelling. He has no new concerns at this time. Pt states that he has been eating well and has had no unexpected weight loss.   Lab results today (06/22/19) of CBC w/diff and CMP is as follows: all values are WNL except for WBC at 3.9K, PLTs at 146K, Glucose at 104, AST at 14. 06/22/2019 LDH is in . Lab Results  Component Value Date   LDH 222 (H) 06/22/2019     On review of systems, pt reports occasional weakness, night sweats, occasional abdominal pain, leg swelling and denies fatigue, diarrhea, unexpected weight loss and any other symptoms.    MEDICAL HISTORY:  Past Medical History:  Diagnosis Date  . Diverticulosis   . Hyperlipidemia   . Hypertension   . Prediabetes   . Vitamin D deficiency   Helicobacter pylori +ve   SURGICAL HISTORY: Past Surgical History:  Procedure Laterality Date  . NO PAST SURGERIES      SOCIAL HISTORY: Social History   Socioeconomic History  . Marital status: Married    Spouse name: Not on file  . Number of children: 5  . Years of education: Not on file  . Highest education level: Not on file  Occupational History  . Occupation: retired  Scientific laboratory technician  . Financial resource strain: Not on file  . Food insecurity    Worry: Not on file    Inability: Not on file  . Transportation needs    Medical: Not on file    Non-medical: Not on file  Tobacco Use  . Smoking status: Former Smoker    Types:  Cigarettes    Quit date: 01/17/2003    Years since quitting: 16.4  . Smokeless tobacco: Never Used  Substance and Sexual Activity  . Alcohol use: Yes    Comment: very rare  . Drug use: No  . Sexual activity: Not on file  Lifestyle  . Physical activity    Days per week: Not on file    Minutes per session: Not on file  . Stress: Not on file  Relationships  . Social Herbalist on phone: Not on file    Gets together: Not on file    Attends  religious service: Not on file    Active member of club or organization: Not on file    Attends meetings of clubs or organizations: Not on file    Relationship status: Not on file  . Intimate partner violence    Fear of current or ex partner: Not on file    Emotionally abused: Not on file    Physically abused: Not on file    Forced sexual activity: Not on file  Other Topics Concern  . Not on file  Social History Narrative  . Not on file  Quit smoking >10 yrs ago, previously 1.5Packs per week. Started smoking in his teens. Retired. Worked as a Chief Financial Officer.   FAMILY HISTORY: Family History  Problem Relation Age of Onset  . Stroke Mother   . Early death Father        Farm/tractor accident  . Diabetes Brother   . Cirrhosis Brother   . Colon cancer Paternal Grandfather     ALLERGIES:  is allergic to penicillins and ppd [tuberculin purified protein derivative].  MEDICATIONS:  Current Outpatient Medications  Medication Sig Dispense Refill  . ALPRAZolam (XANAX) 0.5 MG tablet Take 1/2-1 tablet 2 - 3 x /day ONLY if needed for Anxiety Attack &  limit to 5 days /week to avoid addiction 90 tablet 0  . Alum & Mag Hydroxide-Simeth (MYLANTA PO) Take by mouth as needed.    Marland Kitchen amLODipine (NORVASC) 2.5 MG tablet One pill at night for blood pressure 90 tablet 2  . aspirin EC 81 MG tablet Take 81 mg by mouth daily.    . Cholecalciferol (VITAMIN D3) 5000 units TABS Take 5,000 Units by mouth daily.    . famotidine (PEPCID) 20 MG tablet Take 1 tablet (20 mg total) by mouth daily before breakfast. 30 tablet 1  . losartan-hydrochlorothiazide (HYZAAR) 100-25 MG tablet Take 1 tablet Daily for BP & Fluid 90 tablet 1  . omeprazole (PRILOSEC) 40 MG capsule Take 1 capsule (40 mg total) by mouth daily. 90 capsule 1  . pravastatin (PRAVACHOL) 40 MG tablet TAKE 1 TABLET AT BEDTIME  FOR  CHOLESTEROL 90 tablet 3  . sucralfate (CARAFATE) 1 g tablet Take 1 tablet (1 g total) by mouth 4 (four) times daily -   with meals and at bedtime. 120 tablet 1  . triamcinolone (NASACORT) 55 MCG/ACT AERO nasal inhaler Place 2 sprays into the nose at bedtime. 1 Inhaler 3  . VENCLEXTA 100 MG TABS TAKE 4 TABLETS (400 MG) BY MOUTH DAILY. TAKE WITH FOOD AND WATER 120 tablet 2   No current facility-administered medications for this visit.     REVIEW OF SYSTEMS:    A 10+ POINT REVIEW OF SYSTEMS WAS OBTAINED including neurology, dermatology, psychiatry, cardiac, respiratory, lymph, extremities, GI, GU, Musculoskeletal, constitutional, breasts, reproductive, HEENT.  All pertinent positives are noted in the HPI.  All others are negative.  PHYSICAL EXAMINATION: ECOG PERFORMANCE STATUS: 1 - Symptomatic but completely ambulatory  Vitals:   06/22/19 0932  BP: (!) 170/82  Pulse: 70  Resp: 18  Temp: 98.2 F (36.8 C)  SpO2: 100%   Filed Weights   06/22/19 0932  Weight: 185 lb 9.6 oz (84.2 kg)   .Body mass index is 25.89 kg/m.   GENERAL:alert, in no acute distress and comfortable SKIN: no acute rashes, no significant lesions EYES: conjunctiva are pink and non-injected, sclera anicteric OROPHARYNX: MMM, no exudates, no oropharyngeal erythema or ulceration NECK: supple, no JVD LYMPH:  no palpable lymphadenopathy in the cervical, axillary or inguinal regions LUNGS: clear to auscultation b/l with normal respiratory effort HEART: regular rate & rhythm ABDOMEN:  normoactive bowel sounds , non tender, not distended. No palpable hepatosplenomegaly.  Extremity: trace pedal edema PSYCH: alert & oriented x 3 with fluent speech NEURO: no focal motor/sensory deficits  LABORATORY DATA:  I have reviewed the data as listed  . CBC Latest Ref Rng & Units 06/22/2019 05/26/2019 05/12/2019  WBC 4.0 - 10.5 K/uL 3.9(L) 4.9 4.3  Hemoglobin 13.0 - 17.0 g/dL 13.6 13.9 13.3  Hematocrit 39.0 - 52.0 % 42.9 41.7 39.9  Platelets 150 - 400 K/uL 146(L) 172 130(L)   . CBC    Component Value Date/Time   WBC 3.9 (L) 06/22/2019 0859    RBC 4.51 06/22/2019 0859   HGB 13.6 06/22/2019 0859   HGB 12.2 (L) 01/03/2019 1509   HGB 14.5 10/21/2016 1324   HCT 42.9 06/22/2019 0859   HCT 43.8 10/21/2016 1324   PLT 146 (L) 06/22/2019 0859   PLT 152 01/03/2019 1509   PLT 157 10/21/2016 1324   MCV 95.1 06/22/2019 0859   MCV 90.9 10/21/2016 1324   MCH 30.2 06/22/2019 0859   MCHC 31.7 06/22/2019 0859   RDW 13.8 06/22/2019 0859   RDW 13.3 10/21/2016 1324   LYMPHSABS 1.3 06/22/2019 0859   LYMPHSABS 17.6 (H) 10/21/2016 1324   MONOABS 0.6 06/22/2019 0859   MONOABS 0.6 10/21/2016 1324   EOSABS 0.0 06/22/2019 0859   EOSABS 0.0 10/21/2016 1324   BASOSABS 0.0 06/22/2019 0859   BASOSABS 0.1 10/21/2016 1324     . CMP Latest Ref Rng & Units 06/22/2019 05/26/2019 05/12/2019  Glucose 70 - 99 mg/dL 104(H) 116(H) 90  BUN 8 - 23 mg/dL _0 Creatinine 0.61 - 1.24 mg/dL 1.15 1.15 0.97  Sodium 135 - 145 mmol/L 143 141 140  Potassium 3.5 - 5.1 mmol/L 3.8 3.7 3.8  Chloride 98 - 111 mmol/L 111 109 107  CO2 22 - 32 mmol/L _1 Calcium 8.9 - 10.3 mg/dL 9.3 9.6 9.5  Total Protein 6.5 - 8.1 g/dL 6.6 6.8 6.1  Total Bilirubin 0.3 - 1.2 mg/dL 0.3 0.8 0.4  Alkaline Phos 38 - 126 U/L 76 70 -  AST 15 - 41 U/L 14(L) 13(L) 11  ALT 0 - 44 U/L _2 . Lab Results  Component Value Date   LDH 222 (H) 06/22/2019         RADIOGRAPHIC STUDIES: I have personally reviewed the radiological images as listed and agreed with the findings in the report. No results found.  ASSESSMENT & PLAN:   78 y.o. 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 FISH panel showed p53 (17p13) deletion that would be consistent with this diagnosis . It often represents also be more aggressive form of CLL Differential  diagnosis includes - CD5 neg CLL vs splenic lymphoma vs Marginal Zone lymphoma vs LPL (lymphoplasmacytic lymphoma) LDH WNL suggests against a high grade process. Currently no anemia or  thrombocytopenia noted. Now with drenching night sweats and fatigue, no fevers/chills.  10/31/16 PET/CT scan shows some small hypermetabolic nodes in the thorax including bilateral hilar paratracheal) esophageal. These would be difficult to biopsy.  Lost to follow up between 11/06/16 and 11/19/18  12/01/18 PET/CT revealed Stable scattered small hypermetabolic thoracic lymph nodes, once again at [place] 5 activity level. 2. No findings of involvement in the neck, abdomen/pelvis, or skeleton. 3. Prostatomegaly. 4. Aortic Atherosclerosis and Emphysema 5. Sigmoid colon diverticulosis  PLAN: -Discussed pt labwork today, 06/22/19; all values are WNL except for WBC at 3.9K, PLTs at 146K, Glucose at 104, AST at 14. -Discussed 06/22/2019 LDH is in progress -The pt has no prohibitive toxicities from continuing C6D1 Gazyva and 4104m Venetoclax at this time -If blood counts are stable at next visit, will plan on spacing out visits to every 2 months -Recommend elevating legs, using compression socks, and minimizing salt intake to help with ankle edema -Today is the last planned cycle of Gazyva  -Will continue Venetoclax for up to a year, as long as pt tolerates it and till there is no CLL progression. -Will see back in 6 weeks with labs   2) . Patient Active Problem List   Diagnosis Date Noted  . Memory changes 05/25/2019  . Counseling regarding advance care planning and goals of care 01/24/2019  . Anxiety 02/08/2018  . CLL (chronic lymphocytic leukemia) ([place] 11/01/2016  . Encounter for Medicare annual wellness exam 09/11/2015  . GERD  02/09/2015  . Medication management 11/02/2013  . Hyperlipidemia   . Hypertension   . Abnormal glucose   . Vitamin D deficiency   . Diverticulosis    -f/u with PCP for management of other medical co-morbids  3) H.pylori gastritis -f/u with GI to ensure eradication of h.pylori post treatment.   FOLLOW UP: RTC with Dr KIrene Limbowith labs in 6 weeks    The total  time spent in the appt was 270mutes and more than 50% was on counseling and direct patient cares.  All of the patient's questions were answered with apparent satisfaction. The patient knows to call the clinic with any problems, questions or concerns.   GaSullivan LoneD MSMuskegoAHIVMS SCEl [facility] Center For G[facility]oscopy LLCTHale Ho'Ola Hamakuaematology/Oncology Physician CoBaycare Alliant Hospital(Office):       [phone] cell):  [phone]):           [phone]/23/2020 10:27 AM  I, JaYevette Edwardsam acting as a scribe for Dr. GaSullivan Lone  .I have reviewed the above documentation for accuracy and completeness, and I agree with the above. .GBrunetta GeneraD

## 2019-06-22 ENCOUNTER — Inpatient Hospital Stay: Payer: Medicare HMO

## 2019-06-22 ENCOUNTER — Other Ambulatory Visit: Payer: Self-pay

## 2019-06-22 ENCOUNTER — Inpatient Hospital Stay (HOSPITAL_BASED_OUTPATIENT_CLINIC_OR_DEPARTMENT_OTHER): Payer: Medicare HMO | Admitting: Hematology

## 2019-06-22 ENCOUNTER — Inpatient Hospital Stay: Payer: Medicare HMO | Attending: Hematology

## 2019-06-22 VITALS — BP 139/70 | HR 56 | Resp 18

## 2019-06-22 VITALS — BP 170/82 | HR 70 | Temp 98.2°F | Resp 18 | Ht 71.0 in | Wt 185.6 lb

## 2019-06-22 DIAGNOSIS — Z79899 Other long term (current) drug therapy: Secondary | ICD-10-CM | POA: Diagnosis not present

## 2019-06-22 DIAGNOSIS — C9110 Chronic lymphocytic leukemia of B-cell type not having achieved remission: Secondary | ICD-10-CM | POA: Insufficient documentation

## 2019-06-22 DIAGNOSIS — Z5112 Encounter for antineoplastic immunotherapy: Secondary | ICD-10-CM | POA: Diagnosis not present

## 2019-06-22 DIAGNOSIS — Q9389 Other deletions from the autosomes: Secondary | ICD-10-CM

## 2019-06-22 DIAGNOSIS — Z7189 Other specified counseling: Secondary | ICD-10-CM

## 2019-06-22 LAB — CBC WITH DIFFERENTIAL/PLATELET
Abs Immature Granulocytes: 0.03 10*3/uL (ref 0.00–0.07)
Basophils Absolute: 0 10*3/uL (ref 0.0–0.1)
Basophils Relative: 0 %
Eosinophils Absolute: 0 10*3/uL (ref 0.0–0.5)
Eosinophils Relative: 0 %
HCT: 42.9 % (ref 39.0–52.0)
Hemoglobin: 13.6 g/dL (ref 13.0–17.0)
Immature Granulocytes: 1 %
Lymphocytes Relative: 33 %
Lymphs Abs: 1.3 10*3/uL (ref 0.7–4.0)
MCH: 30.2 pg (ref 26.0–34.0)
MCHC: 31.7 g/dL (ref 30.0–36.0)
MCV: 95.1 fL (ref 80.0–100.0)
Monocytes Absolute: 0.6 10*3/uL (ref 0.1–1.0)
Monocytes Relative: 16 %
Neutro Abs: 2 10*3/uL (ref 1.7–7.7)
Neutrophils Relative %: 50 %
Platelets: 146 10*3/uL — ABNORMAL LOW (ref 150–400)
RBC: 4.51 MIL/uL (ref 4.22–5.81)
RDW: 13.8 % (ref 11.5–15.5)
WBC: 3.9 10*3/uL — ABNORMAL LOW (ref 4.0–10.5)
nRBC: 0 % (ref 0.0–0.2)

## 2019-06-22 LAB — CMP (CANCER CENTER ONLY)
ALT: 8 U/L (ref 0–44)
AST: 14 U/L — ABNORMAL LOW (ref 15–41)
Albumin: 3.8 g/dL (ref 3.5–5.0)
Alkaline Phosphatase: 76 U/L (ref 38–126)
Anion gap: 8 (ref 5–15)
BUN: 16 mg/dL (ref 8–23)
CO2: 24 mmol/L (ref 22–32)
Calcium: 9.3 mg/dL (ref 8.9–10.3)
Chloride: 111 mmol/L (ref 98–111)
Creatinine: 1.15 mg/dL (ref 0.61–1.24)
GFR, Est AFR Am: >60 mL/min (ref >60)
GFR, Estimated: >60 mL/min (ref >60)
Glucose, Bld: 104 mg/dL — ABNORMAL HIGH (ref 70–99)
Potassium: 3.8 mmol/L (ref 3.5–5.1)
Sodium: 143 mmol/L (ref 135–145)
Total Bilirubin: 0.3 mg/dL (ref 0.3–1.2)
Total Protein: 6.6 g/dL (ref 6.5–8.1)

## 2019-06-22 LAB — LACTATE DEHYDROGENASE: LDH: 222 U/L — ABNORMAL HIGH (ref 98–192)

## 2019-06-22 MED ORDER — SODIUM CHLORIDE 0.9 % IV SOLN
1000.0000 mg | Freq: Once | INTRAVENOUS | Status: AC
  Administered 2019-06-22: 1000 mg via INTRAVENOUS
  Filled 2019-06-22: qty 40

## 2019-06-22 MED ORDER — FAMOTIDINE IN NACL 20-0.9 MG/50ML-% IV SOLN
INTRAVENOUS | Status: AC
  Filled 2019-06-22: qty 50

## 2019-06-22 MED ORDER — ACETAMINOPHEN 325 MG PO TABS
650.0000 mg | ORAL_TABLET | Freq: Once | ORAL | Status: AC
  Administered 2019-06-22: 650 mg via ORAL

## 2019-06-22 MED ORDER — SODIUM CHLORIDE 0.9 % IV SOLN
Freq: Once | INTRAVENOUS | Status: AC
  Administered 2019-06-22: 11:00:00 via INTRAVENOUS
  Filled 2019-06-22: qty 250

## 2019-06-22 MED ORDER — DIPHENHYDRAMINE HCL 50 MG/ML IJ SOLN
50.0000 mg | Freq: Once | INTRAMUSCULAR | Status: AC
  Administered 2019-06-22: 11:00:00 50 mg via INTRAVENOUS

## 2019-06-22 MED ORDER — SODIUM CHLORIDE 0.9 % IV SOLN
20.0000 mg | Freq: Once | INTRAVENOUS | Status: AC
  Administered 2019-06-22: 20 mg via INTRAVENOUS
  Filled 2019-06-22: qty 20

## 2019-06-22 MED ORDER — DIPHENHYDRAMINE HCL 50 MG/ML IJ SOLN
INTRAMUSCULAR | Status: AC
  Filled 2019-06-22: qty 1

## 2019-06-22 MED ORDER — FAMOTIDINE IN NACL 20-0.9 MG/50ML-% IV SOLN
20.0000 mg | Freq: Once | INTRAVENOUS | Status: AC
  Administered 2019-06-22: 11:00:00 20 mg via INTRAVENOUS

## 2019-06-22 MED ORDER — ACETAMINOPHEN 325 MG PO TABS
ORAL_TABLET | ORAL | Status: AC
  Filled 2019-06-22: qty 2

## 2019-06-22 NOTE — Patient Instructions (Signed)
[facility] Center Discharge Instructions for Patients Receiving Chemotherapy  Today you received the following chemotherapy agents: Gazyva   To help prevent nausea and vomiting after your treatment, we encourage you to take your nausea medication as directed.    If you develop nausea and vomiting that is not controlled by your nausea medication, call the clinic.   BELOW ARE SYMPTOMS THAT SHOULD BE REPORTED IMMEDIATELY:  *FEVER GREATER THAN 100.5 F  *CHILLS WITH OR WITHOUT FEVER  NAUSEA AND VOMITING THAT IS NOT CONTROLLED WITH YOUR NAUSEA MEDICATION  *UNUSUAL SHORTNESS OF BREATH  *UNUSUAL BRUISING OR BLEEDING  TENDERNESS IN MOUTH AND THROAT WITH OR WITHOUT PRESENCE OF ULCERS  *URINARY PROBLEMS  *BOWEL PROBLEMS  UNUSUAL RASH Items with * indicate a potential emergency and should be followed up as soon as possible.  Feel free to call the clinic should you have any questions or concerns. The clinic phone number is [phone].  Please show the CHEMO ALERT CARD at check-in to the Emergency Department and triage nurse.   

## 2019-06-23 ENCOUNTER — Telehealth: Payer: Self-pay | Admitting: Hematology

## 2019-06-23 NOTE — Telephone Encounter (Signed)
Scheduled appt per 9/23 los.  Spoke with patient and he is aware of his appt date and time.

## 2019-07-09 ENCOUNTER — Other Ambulatory Visit: Payer: Self-pay | Admitting: Internal Medicine

## 2019-07-09 DIAGNOSIS — K2100 Gastro-esophageal reflux disease with esophagitis, without bleeding: Secondary | ICD-10-CM

## 2019-07-09 MED ORDER — OMEPRAZOLE 40 MG PO CPDR: DELAYED_RELEASE_CAPSULE | ORAL | 3 refills | Status: DC

## 2019-07-11 ENCOUNTER — Other Ambulatory Visit: Payer: Self-pay | Admitting: Physician Assistant

## 2019-07-11 DIAGNOSIS — K2100 Gastro-esophageal reflux disease with esophagitis, without bleeding: Secondary | ICD-10-CM

## 2019-07-11 MED ORDER — OMEPRAZOLE 40 MG PO CPDR: DELAYED_RELEASE_CAPSULE | ORAL | 3 refills | Status: DC

## 2019-07-11 NOTE — Progress Notes (Signed)
Future Appointments  Date Time Provider [place]  07/13/2019 10:00 AM Penni Bombard, MD [facility] None  08/03/2019  1:30 PM [facility] None  08/03/2019  2:00 PM Brunetta Genera, MD [facility] None  08/24/2019  9:00 AM Unk Pinto, MD [facility] None

## 2019-07-13 ENCOUNTER — Ambulatory Visit: Payer: Self-pay | Admitting: Diagnostic Neuroimaging

## 2019-07-18 ENCOUNTER — Other Ambulatory Visit: Payer: Self-pay | Admitting: Physician Assistant

## 2019-07-18 DIAGNOSIS — K2100 Gastro-esophageal reflux disease with esophagitis, without bleeding: Secondary | ICD-10-CM

## 2019-07-18 MED ORDER — OMEPRAZOLE 40 MG PO CPDR: DELAYED_RELEASE_CAPSULE | ORAL | 3 refills | Status: DC

## 2019-07-18 MED FILL — VENCLEXTA 100 MG TABS: 100 | 30 days supply | Qty: 120 | Fill #2

## 2019-07-18 NOTE — Progress Notes (Signed)
Future Appointments  Date Time Provider [place]  08/03/2019  1:30 PM [facility] 1 [facility] None  08/03/2019  2:00 PM Brunetta Genera, MD [facility] None  08/24/2019  9:00 AM Unk Pinto, MD [facility] None

## 2019-07-31 NOTE — Progress Notes (Deleted)
Assessment and Plan:  There are no diagnoses linked to this encounter.    Further disposition pending results of labs. Discussed med's effects and SE's.   Over 30 minutes of exam, counseling, chart review, and critical decision making was performed.   Future Appointments  Date Time Provider [place]  08/02/2019 10:30 AM [name], NP [facility] None  08/03/2019  1:30 PM [facility] 2 [facility] None  08/03/2019  2:00 PM Brunetta Genera, MD [facility] None  08/24/2019  9:00 AM Unk Pinto, MD [facility] None    ------------------------------------------------------------------------------------------------------------------   HPI 78 y.o.male presents for  Past Medical History:  Diagnosis Date  . Diverticulosis   . Hyperlipidemia   . Hypertension   . Prediabetes   . Vitamin D deficiency      Allergies  Allergen Reactions  . Penicillins Other (See Comments)    Unknown allergic reaction as a teenager Did it involve swelling of the face/tongue/throat, SOB, or low BP? Unknown Did it involve sudden or severe rash/hives, skin peeling, or any reaction on the inside of your mouth or nose? Unknown Did you need to seek medical attention at a hospital or doctor's office? Unknown When did it last happen?Teenager If all above answers are "NO", may proceed with cephalosporin use.   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 ALPRAZolam (XANAX) 0.5 MG tablet Take 1/2-1 tablet 2 - 3 x /day ONLY if needed for Anxiety Attack &  limit to 5 days /week to avoid addiction  . Alum & Mag Hydroxide-Simeth (MYLANTA PO) Take by mouth as needed.  Marland Kitchen amLODipine (NORVASC) 2.5 MG tablet One pill at night for blood pressure  . aspirin EC 81 MG tablet Take 81 mg by mouth daily.  . Cholecalciferol (VITAMIN D3) 5000 units TABS Take 5,000 Units by mouth daily.  .  famotidine (PEPCID) 20 MG tablet Take 1 tablet (20 mg total) by mouth daily before breakfast.  . losartan-hydrochlorothiazide (HYZAAR) 100-25 MG tablet Take 1 tablet Daily for BP & Fluid  . omeprazole (PRILOSEC) 40 MG capsule Take 1 capsule Daily for Indigestion & Heartburn  . pravastatin (PRAVACHOL) 40 MG tablet TAKE 1 TABLET AT BEDTIME  FOR  CHOLESTEROL  . sucralfate (CARAFATE) 1 g tablet Take 1 tablet (1 g total) by mouth 4 (four) times daily -  with meals and at bedtime.  . triamcinolone (NASACORT) 55 MCG/ACT AERO nasal inhaler Place 2 sprays into the nose at bedtime.  . VENCLEXTA 100 MG TABS TAKE 4 TABLETS (400 MG) BY MOUTH DAILY. TAKE WITH FOOD AND WATER   No current facility-administered medications on file prior to visit.     ROS: all negative except above.   Physical Exam:  There were no vitals taken for this visit.  General Appearance: Well nourished, in no apparent distress. Eyes: PERRLA, EOMs, conjunctiva no swelling or erythema Sinuses: No Frontal/maxillary tenderness ENT/Mouth: Ext aud canals clear, TMs without erythema, bulging. No erythema, swelling, or exudate on post pharynx.  Tonsils not swollen or erythematous. Hearing normal.  Neck: Supple, thyroid normal.  Respiratory: Respiratory effort normal, BS equal bilaterally without rales, rhonchi, wheezing or stridor.  Cardio: RRR with no MRGs. Brisk peripheral pulses without edema.  Abdomen: Soft, + BS.  Non tender, no guarding, rebound, hernias, masses. Lymphatics: Non tender without lymphadenopathy.  Musculoskeletal: Full ROM, 5/5 strength, normal gait.  Skin: Warm, dry without  rashes, lesions, ecchymosis.  Neuro: Cranial nerves intact. Normal muscle tone, no cerebellar symptoms. Sensation intact.  Psych: Awake and oriented X 3, normal affect, Insight and Judgment appropriate.     [name], NP 11:13 PM [facility]

## 2019-08-02 ENCOUNTER — Ambulatory Visit: Payer: Medicare HMO | Admitting: Adult Health Nurse Practitioner

## 2019-08-03 ENCOUNTER — Inpatient Hospital Stay: Payer: Medicare HMO | Attending: Hematology

## 2019-08-03 ENCOUNTER — Inpatient Hospital Stay (HOSPITAL_BASED_OUTPATIENT_CLINIC_OR_DEPARTMENT_OTHER): Payer: Medicare HMO | Admitting: Hematology

## 2019-08-03 ENCOUNTER — Other Ambulatory Visit: Payer: Self-pay

## 2019-08-03 VITALS — BP 165/86 | HR 70 | Temp 98.9°F | Resp 18 | Ht 71.0 in | Wt 179.7 lb

## 2019-08-03 DIAGNOSIS — Z5112 Encounter for antineoplastic immunotherapy: Secondary | ICD-10-CM

## 2019-08-03 DIAGNOSIS — Z79899 Other long term (current) drug therapy: Secondary | ICD-10-CM | POA: Diagnosis present

## 2019-08-03 DIAGNOSIS — C9110 Chronic lymphocytic leukemia of B-cell type not having achieved remission: Secondary | ICD-10-CM | POA: Diagnosis not present

## 2019-08-03 LAB — CBC WITH DIFFERENTIAL/PLATELET
Abs Immature Granulocytes: 0.01 10*3/uL (ref 0.00–0.07)
Basophils Absolute: 0 10*3/uL (ref 0.0–0.1)
Basophils Relative: 1 %
Eosinophils Absolute: 0 10*3/uL (ref 0.0–0.5)
Eosinophils Relative: 0 %
HCT: 47.5 % (ref 39.0–52.0)
Hemoglobin: 15.5 g/dL (ref 13.0–17.0)
Immature Granulocytes: 0 %
Lymphocytes Relative: 36 %
Lymphs Abs: 1.4 10*3/uL (ref 0.7–4.0)
MCH: 30.2 pg (ref 26.0–34.0)
MCHC: 32.6 g/dL (ref 30.0–36.0)
MCV: 92.6 fL (ref 80.0–100.0)
Monocytes Absolute: 0.7 10*3/uL (ref 0.1–1.0)
Monocytes Relative: 18 %
Neutro Abs: 1.8 10*3/uL (ref 1.7–7.7)
Neutrophils Relative %: 45 %
Platelets: 163 10*3/uL (ref 150–400)
RBC: 5.13 MIL/uL (ref 4.22–5.81)
RDW: 13 % (ref 11.5–15.5)
WBC: 4 10*3/uL (ref 4.0–10.5)
nRBC: 0 % (ref 0.0–0.2)

## 2019-08-03 LAB — CMP (CANCER CENTER ONLY)
ALT: 13 U/L (ref 0–44)
AST: 16 U/L (ref 15–41)
Albumin: 4.3 g/dL (ref 3.5–5.0)
Alkaline Phosphatase: 83 U/L (ref 38–126)
Anion gap: 11 (ref 5–15)
BUN: 14 mg/dL (ref 8–23)
CO2: 25 mmol/L (ref 22–32)
Calcium: 10.2 mg/dL (ref 8.9–10.3)
Chloride: 105 mmol/L (ref 98–111)
Creatinine: 1.18 mg/dL (ref 0.61–1.24)
GFR, Est AFR Am: >60 mL/min (ref >60)
GFR, Estimated: 59 mL/min — ABNORMAL LOW (ref >60)
Glucose, Bld: 92 mg/dL (ref 70–99)
Potassium: 3.8 mmol/L (ref 3.5–5.1)
Sodium: 141 mmol/L (ref 135–145)
Total Bilirubin: 0.8 mg/dL (ref 0.3–1.2)
Total Protein: 7.6 g/dL (ref 6.5–8.1)

## 2019-08-03 LAB — LACTATE DEHYDROGENASE: LDH: 193 U/L — ABNORMAL HIGH (ref 98–192)

## 2019-08-03 LAB — RETICULOCYTES
Immature Retic Fract: 16.6 % — ABNORMAL HIGH (ref 2.3–15.9)
RBC.: 5.17 MIL/uL (ref 4.22–5.81)
Retic Count, Absolute: 59.5 10*3/uL (ref 19.0–186.0)
Retic Ct Pct: 1.2 % (ref 0.4–3.1)

## 2019-08-03 NOTE — Progress Notes (Signed)
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 male who has been referred to [place] by Dr .Unk Pinto, MD for evaluation and management of lymphocytosis concerning for a lymphoproliferative process.  Patient has a h/o HTN, HLD, Prediabetes who notes that he had significant abdominal discomfort with significant dyspeptic symptoms around aug 2017 and had about 20-25lbs weight loss and had an extensive GI workup including CT abd/pelvis 05/25/2016 - WNL, EGD which showed chronic gastritis (helicobacter pylori +ve) and colonscopy with some polyps. Capsule endoscopy was not approved by insurance. Patients symptoms improved after treatment of H.pylori and he notes that he has gained back about 6-7 lbs and is eating better.  Patients labs were noted to show some leucocytosis/Lymphocytosis for which he was referred to [place] for further evaluation.  Patient notes some nightsweats and weight loss (as noted above, possible alternative explanation). No fevers/chills. Has not noted any overt enlarged LN and CT abd in 04/2016 showed no hepatomegaly or splenomegaly and no abd/RP LNadenopathy.  Patient notes no other acute focal symptoms.   INTERVAL HISTORY  Mr Beedle is here for management and evaluation of his CLL. We are joined today by his wife. The patient's last visit with [place] was on 06/22/2019. The pt reports that he is doing well overall.  The pt reports that he is still experiencing night sweats but they are not  too bothersome. Pt states that they happen every night and he is always warm when they occur. He is waking up in the night to urinate and it is then that he notices that he is sweating. His wife reports that pt has an issue with balance and leg weakness in the mornings when he first wakes up. He is having to hold on items around the house to help him get around. Pt reports that he walks some daily but not as much as he should. There is nothing that is preventing him from walking at this time. He does feel that he drinks a sufficient amount of water. Pt is interested in spacing out his Venetoclax dosage throughout the day.   Lab results today (08/03/19) of CBC w/diff and CMP is as follows: all values are WNL. 08/03/2019 LDH at 193 08/03/2019 Reticulocytes is as follows: Retic Ct Pct at 1.2, RBC at 5.17, Retic Count Abs at 59.5, Immature Retic Fract at 16.6  On review of systems, pt reports fatigue, lower extremity weakness, balance issues, leg swelling, occasional abdominal pain and denies mouth sores, dental pain and any other symptoms.    MEDICAL HISTORY:  Past Medical History:  Diagnosis Date  . Diverticulosis   . Hyperlipidemia   . Hypertension   . Prediabetes   . Vitamin D deficiency   Helicobacter pylori +ve   SURGICAL HISTORY: Past Surgical History:  Procedure Laterality Date  . NO PAST SURGERIES      SOCIAL HISTORY: Social History   Socioeconomic History  . Marital status: Married    Spouse name: Not on file  . Number of children: 5  . Years of education: Not on  file  . Highest education level: Not on file  Occupational History  . Occupation: retired  Scientific laboratory technician  . Financial resource strain: Not on file  . Food insecurity    Worry: Not on file    Inability: Not on file  . Transportation needs    Medical: Not on file    Non-medical: Not on file  Tobacco Use  . Smoking status: Former Smoker    Types: Cigarettes    Quit date: 01/17/2003    Years since quitting:  16.5  . Smokeless tobacco: Never Used  Substance and Sexual Activity  . Alcohol use: Yes    Comment: very rare  . Drug use: No  . Sexual activity: Not on file  Lifestyle  . Physical activity    Days per week: Not on file    Minutes per session: Not on file  . Stress: Not on file  Relationships  . Social Herbalist on phone: Not on file    Gets together: Not on file    Attends religious service: Not on file    Active member of club or organization: Not on file    Attends meetings of clubs or organizations: Not on file    Relationship status: Not on file  . Intimate partner violence    Fear of current or ex partner: Not on file    Emotionally abused: Not on file    Physically abused: Not on file    Forced sexual activity: Not on file  Other Topics Concern  . Not on file  Social History Narrative  . Not on file  Quit smoking >10 yrs ago, previously 1.5Packs per week. Started smoking in his teens. Retired. Worked as a Chief Financial Officer.   FAMILY HISTORY: Family History  Problem Relation Age of Onset  . Stroke Mother   . Early death Father        Farm/tractor accident  . Diabetes Brother   . Cirrhosis Brother   . Colon cancer Paternal Grandfather     ALLERGIES:  is allergic to penicillins and ppd [tuberculin purified protein derivative].  MEDICATIONS:  Current Outpatient Medications  Medication Sig Dispense Refill  . ALPRAZolam (XANAX) 0.5 MG tablet Take 1/2-1 tablet 2 - 3 x /day ONLY if needed for Anxiety Attack &  limit to 5 days /week to avoid addiction 90 tablet 0  . Alum & Mag Hydroxide-Simeth (MYLANTA PO) Take by mouth as needed.    Marland Kitchen amLODipine (NORVASC) 2.5 MG tablet One pill at night for blood pressure 90 tablet 2  . aspirin EC 81 MG tablet Take 81 mg by mouth daily.    . Cholecalciferol (VITAMIN D3) 5000 units TABS Take 5,000 Units by mouth daily.    . famotidine (PEPCID) 20 MG tablet Take 1 tablet (20 mg total) by mouth daily before breakfast. 30  tablet 1  . losartan-hydrochlorothiazide (HYZAAR) 100-25 MG tablet Take 1 tablet Daily for BP & Fluid 90 tablet 1  . omeprazole (PRILOSEC) 40 MG capsule Take 1 capsule Daily for Indigestion & Heartburn 90 capsule 3  . pravastatin (PRAVACHOL) 40 MG tablet TAKE 1 TABLET AT BEDTIME  FOR  CHOLESTEROL 90 tablet 3  . sucralfate (CARAFATE) 1 g tablet Take 1 tablet (1 g total) by mouth 4 (four) times daily -  with meals and at bedtime. 120 tablet 1  . triamcinolone (NASACORT) 55 MCG/ACT AERO nasal inhaler Place 2 sprays into the nose at bedtime. 1 Inhaler 3  . VENCLEXTA  100 MG TABS TAKE 4 TABLETS (400 MG) BY MOUTH DAILY. TAKE WITH FOOD AND WATER 120 tablet 2   No current facility-administered medications for this visit.     REVIEW OF SYSTEMS:   A 10+ POINT REVIEW OF SYSTEMS WAS OBTAINED including neurology, dermatology, psychiatry, cardiac, respiratory, lymph, extremities, GI, GU, Musculoskeletal, constitutional, breasts, reproductive, HEENT.  All pertinent positives are noted in the HPI.  All others are negative.   PHYSICAL EXAMINATION: ECOG PERFORMANCE STATUS: 1 - Symptomatic but completely ambulatory  Vitals:   08/03/19 1413  BP: (!) 165/86  Pulse: 70  Resp: 18  Temp: 98.9 F (37.2 C)  SpO2: 100%   Filed Weights   08/03/19 1413  Weight: 179 lb 11.2 oz (81.5 kg)   .Body mass index is 25.06 kg/m.   GENERAL:alert, in no acute distress and comfortable SKIN: no acute rashes, no significant lesions EYES: conjunctiva are pink and non-injected, sclera anicteric OROPHARYNX: MMM, no exudates, no oropharyngeal erythema or ulceration NECK: supple, no JVD LYMPH:  no palpable lymphadenopathy in the cervical, axillary or inguinal regions LUNGS: clear to auscultation b/l with normal respiratory effort HEART: regular rate & rhythm ABDOMEN:  normoactive bowel sounds , non tender, not distended. No palpable hepatosplenomegaly.  Extremity: minimal pedal edema PSYCH: alert & oriented x 3 with fluent  speech NEURO: no focal motor/sensory deficits  LABORATORY DATA:  I have reviewed the data as listed  . CBC Latest Ref Rng & Units 08/03/2019 06/22/2019 05/26/2019  WBC 4.0 - 10.5 K/uL 4.0 3.9(L) 4.9  Hemoglobin 13.0 - 17.0 g/dL 15.5 13.6 13.9  Hematocrit 39.0 - 52.0 % 47.5 42.9 41.7  Platelets 150 - 400 K/uL 163 146(L) 172   . CBC    Component Value Date/Time   WBC 4.0 08/03/2019 1339   RBC 5.17 08/03/2019 1340   RBC 5.13 08/03/2019 1339   HGB 15.5 08/03/2019 1339   HGB 12.2 (L) 01/03/2019 1509   HGB 14.5 10/21/2016 1324   HCT 47.5 08/03/2019 1339   HCT 43.8 10/21/2016 1324   PLT 163 08/03/2019 1339   PLT 152 01/03/2019 1509   PLT 157 10/21/2016 1324   MCV 92.6 08/03/2019 1339   MCV 90.9 10/21/2016 1324   MCH 30.2 08/03/2019 1339   MCHC 32.6 08/03/2019 1339   RDW 13.0 08/03/2019 1339   RDW 13.3 10/21/2016 1324   LYMPHSABS 1.4 08/03/2019 1339   LYMPHSABS 17.6 (H) 10/21/2016 1324   MONOABS 0.7 08/03/2019 1339   MONOABS 0.6 10/21/2016 1324   EOSABS 0.0 08/03/2019 1339   EOSABS 0.0 10/21/2016 1324   BASOSABS 0.0 08/03/2019 1339   BASOSABS 0.1 10/21/2016 1324     . CMP Latest Ref Rng & Units 08/03/2019 06/22/2019 05/26/2019  Glucose 70 - 99 mg/dL 92 104(H) 116(H)  BUN 8 - 23 mg/dL '14 16 14  ' Creatinine 0.61 - 1.24 mg/dL 1.18 1.15 1.15  Sodium 135 - 145 mmol/L 141 143 141  Potassium 3.5 - 5.1 mmol/L 3.8 3.8 3.7  Chloride 98 - 111 mmol/L 105 111 109  CO2 22 - 32 mmol/L '25 24 24  ' Calcium 8.9 - 10.3 mg/dL 10.2 9.3 9.6  Total Protein 6.5 - 8.1 g/dL 7.6 6.6 6.8  Total Bilirubin 0.3 - 1.2 mg/dL 0.8 0.3 0.8  Alkaline Phos 38 - 126 U/L 83 76 70  AST 15 - 41 U/L 16 14(L) 13(L)  ALT 0 - 44 U/L '13 8 10   ' . Lab Results  Component Value Date   LDH 193 (H)  08/03/2019         RADIOGRAPHIC STUDIES: I have personally reviewed the radiological images as listed and agreed with the findings in the report. No results found.  ASSESSMENT & PLAN:   78 y.o. 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 FISH panel showed p53 (17p13) deletion that would be consistent with this diagnosis . It often represents also be more aggressive form of CLL Differential diagnosis includes - CD5 neg CLL vs splenic lymphoma vs Marginal Zone lymphoma vs LPL (lymphoplasmacytic lymphoma) LDH WNL suggests against a high grade process. Currently no anemia or thrombocytopenia noted. Now with drenching night sweats and fatigue, no fevers/chills.  10/31/16 PET/CT scan shows some small hypermetabolic nodes in the thorax including bilateral hilar paratracheal) esophageal. These would be difficult to biopsy.  Lost to follow up between 11/06/16 and 11/19/18  12/01/18 PET/CT revealed Stable scattered small hypermetabolic thoracic lymph nodes, once again at [place] 5 activity level. 2. No findings of involvement in the neck, abdomen/pelvis, or skeleton. 3. Prostatomegaly. 4. Aortic Atherosclerosis and Emphysema 5. Sigmoid colon diverticulosis  PLAN: -Discussed pt labwork today, 08/03/19; blood counts and chemistries are nml -Discussed 08/03/2019 LDH at 193 -Discussed 08/03/2019 Reticulocytes is as follows: Retic Ct Pct at 1.2, RBC at 5.17, Retic Count Abs at 59.5, Immature Retic Fract at 16.6 -Recommended that the pt continue to eat well, drink at least 48-64 oz of water each day, and walk 20-30 minutes each day.  -The pt has no prohibitive toxicities from continuing 400 mg Venetoclax at this time  -Will continue Venetoclax for up to a year, as long as pt tolerates it and till there is no CLL progression. -Recommended pt continue to take 400 mg Venetoclax once daily. -The pt shows no overt clinical or lab progression of his CLL at this time.  -Will consider stopping maintenance Venetoclax after 1 year of treatment If labs are stable -Will see back in 2 months with labs    2) . Patient Active Problem List   Diagnosis Date Noted  . Memory changes  05/25/2019  . Counseling regarding advance care planning and goals of care 01/24/2019  . Anxiety 02/08/2018  . CLL (chronic lymphocytic leukemia) ([place]) 11/01/2016  . Encounter for Medicare annual wellness exam 09/11/2015  . GERD  02/09/2015  . Medication management 11/02/2013  . Hyperlipidemia   . Hypertension   . Abnormal glucose   . Vitamin D deficiency   . Diverticulosis    -f/u with PCP for management of other medical co-morbids  3) H.pylori gastritis -f/u with GI to ensure eradication of h.pylori post treatment.   FOLLOW UP: RTC with Dr Irene Limbo with labs in 2 months   The total time spent in the appt was 20 minutes and more than 50% was on counseling and direct patient cares.  All of the patient's questions were answered with apparent satisfaction. The patient knows to call the clinic with any problems, questions or concerns.  Sullivan Lone MD [place] AAHIVMS [facility] Hematology/Oncology Physician [facility]  (Office):       [phone] (Work cell):  [phone] (Fax):           [phone]  08/03/2019 3:27 PM  I, Yevette Edwards, am acting as a scribe for Dr. Sullivan Lone.   .I have reviewed the above documentation for accuracy and completeness, and I agree with the above. Brunetta Genera MD

## 2019-08-04 ENCOUNTER — Telehealth: Payer: Self-pay | Admitting: Hematology

## 2019-08-04 NOTE — Telephone Encounter (Signed)
Scheduled appt per 11/4 los.  Spoke with pt wife and she is aware of the appt date and time

## 2019-08-12 ENCOUNTER — Other Ambulatory Visit: Payer: Self-pay | Admitting: Hematology

## 2019-08-12 DIAGNOSIS — C9110 Chronic lymphocytic leukemia of B-cell type not having achieved remission: Secondary | ICD-10-CM

## 2019-08-23 ENCOUNTER — Encounter: Payer: Self-pay | Admitting: Internal Medicine

## 2019-08-23 DIAGNOSIS — I700 Atherosclerosis of aorta: Secondary | ICD-10-CM | POA: Insufficient documentation

## 2019-08-23 NOTE — Progress Notes (Addendum)
      F  O  R  G  O  T             A  P  P  '  T

## 2019-08-24 ENCOUNTER — Ambulatory Visit: Payer: Medicare HMO | Admitting: Internal Medicine

## 2019-08-28 NOTE — Progress Notes (Signed)
History of Present Illness:      This very nice 78 y.o. MBM presents for belated  follow up with HTN, HLD, Pre-Diabetes and Vitamin D Deficiency. Patient has GERD c with occasional "b=reak-thru" in the early morning hours or in the morning.       Patient is followed by Dr Irene Limbo for CLL and has been started on Venclexta.        Patient is treated for HTN circa  2004 & BP has been controlled at home. Todays BP was elevated at 156/82 which he attributes to forgetting to take his BP meds this morning.  Patient has had no complaints of any cardiac type chest pain, palpitations, dyspnea / orthopnea / PND, dizziness, claudication, or dependent edema.      Hyperlipidemia is near controlled with diet & meds. Patient denies myalgias or other med SEs. Last Lipids were not at goal:  Lab Results  Component Value Date   CHOL 170 05/12/2019   HDL 47 05/12/2019   LDLCALC 105 (H) 05/12/2019   TRIG 85 05/12/2019   CHOLHDL 3.6 05/12/2019        Also, the patient has history of  PreDiabetes (A1c 5.8% / 2010) and has had no symptoms of reactive hypoglycemia, diabetic polys, paresthesias or visual blurring.  Last A1c was Normal & at goal:  Lab Results  Component Value Date   HGBA1C 5.4 09/15/2018        Further, the patient also has history of Vitamin D Deficiency and supplements vitamin D without any suspected side-effects. Last vitamin D was near goal (70-100):  Lab Results  Component Value Date   VD25OH 58 05/12/2019    Current Outpatient Medications on File Prior to Visit  Medication Sig   ALPRAZolam (XANAX) 0.5 MG tablet Take 1/2-1 tablet 2 - 3 x /day ONLY if needed for Anxiety Attack &  limit to 5 days /week to avoid addiction   Alum & Mag Hydroxide-Simeth (MYLANTA PO) Take by mouth as needed.   amLODipine (NORVASC) 2.5 MG tablet One pill at night for blood pressure   aspirin EC 81 MG tablet Take 81 mg by mouth daily.   Cholecalciferol (VITAMIN D3) 5000 units TABS Take 5,000 Units  by mouth daily.   famotidine (PEPCID) 20 MG tablet Take 1 tablet (20 mg total) by mouth daily before breakfast.   losartan-hydrochlorothiazide (HYZAAR) 100-25 MG tablet Take 1 tablet Daily for BP & Fluid   omeprazole (PRILOSEC) 40 MG capsule Take 1 capsule Daily for Indigestion & Heartburn   pravastatin (PRAVACHOL) 40 MG tablet TAKE 1 TABLET AT BEDTIME  FOR  CHOLESTEROL   sucralfate (CARAFATE) 1 g tablet Take 1 tablet (1 g total) by mouth 4 (four) times daily -  with meals and at bedtime.   triamcinolone (NASACORT) 55 MCG/ACT AERO nasal inhaler Place 2 sprays into the nose at bedtime.   VENCLEXTA 100 MG TABS TAKE 4 TABLETS (400 MG) BY MOUTH DAILY. TAKE WITH FOOD AND WATER   No current facility-administered medications on file prior to visit.     Allergies  Allergen Reactions   Penicillins Other (See Comments)    Unknown allergic reaction as a teenager   Ppd [Tuberculin Purified Protein Derivative]     Positive test in 2004    PMHx:   Past Medical History:  Diagnosis Date   Diverticulosis    Hyperlipidemia    Hypertension    Prediabetes    Vitamin D deficiency    Immunization History  Administered  Date(s) Administered   Influenza Split 07/28/2013   Influenza, High Dose Seasonal PF 09/11/2015, 06/09/2016, 09/15/2018   Pneumococcal Conjugate-13 11/05/2017   Pneumococcal-Unspecified 07/01/1999, 09/20/2009   Td 09/20/2009   Zoster 11/08/2009   Past Surgical History:  Procedure Laterality Date   NO PAST SURGERIES      FHx:    Reviewed / unchanged  SHx:    Reviewed / unchanged   Systems Review:  Constitutional: Denies fever, chills, wt changes, headaches, insomnia, fatigue, night sweats, change in appetite. Eyes: Denies redness, blurred vision, diplopia, discharge, itchy, watery eyes.  ENT: Denies discharge, congestion, post nasal drip, epistaxis, sore throat, earache, hearing loss, dental pain, tinnitus, vertigo, sinus pain, snoring.  CV: Denies  chest pain, palpitations, irregular heartbeat, syncope, dyspnea, diaphoresis, orthopnea, PND, claudication or edema. Respiratory: denies cough, dyspnea, DOE, pleurisy, hoarseness, laryngitis, wheezing.  Gastrointestinal: Denies dysphagia, odynophagia, heartburn, reflux, water brash, abdominal pain or cramps, nausea, vomiting, bloating, diarrhea, constipation, hematemesis, melena, hematochezia  or hemorrhoids. Genitourinary: Denies dysuria, frequency, urgency, nocturia, hesitancy, discharge, hematuria or flank pain. Musculoskeletal: Denies arthralgias, myalgias, stiffness, jt. swelling, pain, limping or strain/sprain.  Skin: Denies pruritus, rash, hives, warts, acne, eczema or change in skin lesion(s). Neuro: No weakness, tremor, incoordination, spasms, paresthesia or pain. Psychiatric: Denies confusion, memory loss or sensory loss. Endo: Denies change in weight, skin or hair change.  Heme/Lymph: No excessive bleeding, bruising or enlarged lymph nodes.  Physical Exam  BP (!) 156/82    Pulse 64    Temp 97.7 F (36.5 C)    Ht 5\' 11"  (1.803 m)    Wt 190 lb 3.2 oz (86.3 kg)    SpO2 99%    BMI 26.53 kg/m   Appears  well nourished, well groomed  and in no distress.  Eyes: PERRLA, EOMs, conjunctiva no swelling or erythema. Sinuses: No frontal/maxillary tenderness ENT/Mouth: EAC's clear, TM's nl w/o erythema, bulging. Nares clear w/o erythema, swelling, exudates. Oropharynx clear without erythema or exudates. Oral hygiene is good. Tongue normal, non obstructing. Hearing intact.  Neck: Supple. Thyroid not palpable. Car 2+/2+ without bruits, nodes or JVD. Chest: Respirations nl with BS clear & equal w/o rales, rhonchi, wheezing or stridor.  Cor: Heart sounds normal w/ regular rate and rhythm without sig. murmurs, gallops, clicks or rubs. Peripheral pulses normal and equal  without edema.  Abdomen: Soft & bowel sounds normal. Non-tender w/o guarding, rebound, hernias, masses or organomegaly.   Lymphatics: Unremarkable.  Musculoskeletal: Full ROM all peripheral extremities, joint stability, 5/5 strength and normal gait.  Skin: Warm, dry without exposed rashes, lesions or ecchymosis apparent.  Neuro: Cranial nerves intact, reflexes equal bilaterally. Sensory-motor testing grossly intact. Tendon reflexes grossly intact.  Pysch: Alert & oriented x 3.  Insight and judgement nl & appropriate. No ideations.  Assessment and Plan:  1. Essential hypertension  - Continue medication, monitor blood pressure at home.  - Continue DASH diet.  Reminder to go to the ER if any CP,  SOB, nausea, dizziness, severe HA, changes vision/speech.  - Magnesium - Lipid Profile - TSH  2. Hyperlipidemia, mixed  - Continue diet/meds, exercise,& lifestyle modifications.  - Continue monitor periodic cholesterol/liver & renal functions   - Lipid Profile - TSH  3. Abnormal glucose  - Continue diet, exercise  - Lifestyle modifications.  - Monitor appropriate labs.  - Insulin, random - Fructosamine  4. Vitamin D deficiency  - Continue supplementation.  - Vitamin D (25 hydroxy)  5. CLL (chronic lymphocytic leukemia) ([place])  6. Medication management  -  Magnesium - Lipid Profile - TSH - Insulin, random - Vitamin D (25 hydroxy) - Fructosamine       Discussed  regular exercise, BP monitoring, weight control to achieve/maintain BMI less than 25 and discussed med and SE's. Recommended labs to assess and monitor clinical status with further disposition pending results of labs.  I discussed the assessment and treatment plan with the patient. The patient was provided an opportunity to ask questions and all were answered. The patient agreed with the plan and demonstrated an understanding of the instructions.  I provided over 30 minutes of exam, counseling, chart review and  complex critical decision making.  Kirtland Bouchard, MD

## 2019-08-28 NOTE — Patient Instructions (Signed)

## 2019-08-29 ENCOUNTER — Other Ambulatory Visit: Payer: Self-pay

## 2019-08-29 ENCOUNTER — Ambulatory Visit (INDEPENDENT_AMBULATORY_CARE_PROVIDER_SITE_OTHER): Payer: Medicare HMO | Admitting: Internal Medicine

## 2019-08-29 ENCOUNTER — Encounter: Payer: Self-pay | Admitting: Internal Medicine

## 2019-08-29 VITALS — BP 156/82 | HR 64 | Temp 97.7°F | Ht 71.0 in | Wt 190.2 lb

## 2019-08-29 DIAGNOSIS — Z79899 Other long term (current) drug therapy: Secondary | ICD-10-CM | POA: Diagnosis not present

## 2019-08-29 DIAGNOSIS — I10 Essential (primary) hypertension: Secondary | ICD-10-CM

## 2019-08-29 DIAGNOSIS — C9110 Chronic lymphocytic leukemia of B-cell type not having achieved remission: Secondary | ICD-10-CM | POA: Diagnosis not present

## 2019-08-29 DIAGNOSIS — K2100 Gastro-esophageal reflux disease with esophagitis, without bleeding: Secondary | ICD-10-CM | POA: Diagnosis not present

## 2019-08-29 DIAGNOSIS — R7309 Other abnormal glucose: Secondary | ICD-10-CM

## 2019-08-29 DIAGNOSIS — E782 Mixed hyperlipidemia: Secondary | ICD-10-CM

## 2019-08-29 DIAGNOSIS — E559 Vitamin D deficiency, unspecified: Secondary | ICD-10-CM | POA: Diagnosis not present

## 2019-08-29 MED ORDER — OMEPRAZOLE 40 MG PO CPDR: DELAYED_RELEASE_CAPSULE | ORAL | 3 refills | Status: DC

## 2019-08-29 MED FILL — VENCLEXTA 100 MG TABS: 100 | 30 days supply | Qty: 120 | Fill #0

## 2019-09-03 LAB — MAGNESIUM: Magnesium: 2.2 mg/dL (ref 1.5–2.5)

## 2019-09-03 LAB — FRUCTOSAMINE: Fructosamine: 193 umol/L — ABNORMAL LOW (ref 205–285)

## 2019-09-03 LAB — LIPID PANEL
Cholesterol: 148 mg/dL (ref <200)
HDL: 40 mg/dL (ref >=40)
LDL Cholesterol (Calc): 89 mg/dL (calc)
Non-HDL Cholesterol (Calc): 108 mg/dL (calc) (ref <130)
Total CHOL/HDL Ratio: 3.7 (calc) (ref <5.0)
Triglycerides: 98 mg/dL (ref <150)

## 2019-09-03 LAB — VITAMIN D 25 HYDROXY (VIT D DEFICIENCY, FRACTURES): Vit D, 25-Hydroxy: 64 ng/mL (ref 30–100)

## 2019-09-03 LAB — TSH: TSH: 1.45 mIU/L (ref 0.40–4.50)

## 2019-09-03 LAB — INSULIN, RANDOM: Insulin: 13.4 u[IU]/mL

## 2019-10-03 NOTE — Progress Notes (Signed)
HEMATOLOGY/ONCOLOGY CLINIC NOTE  Date of Service: 10/04/19   Patient Care Team: Unk Pinto, MD as PCP - General (Internal Medicine) Inda Castle, MD (Inactive) as Consulting Physician (Gastroenterology) Melissa Noon, [place] as Referring Physician (Optometry) Danis, Kirke Corin, MD as Consulting Physician (Gastroenterology) Brunetta Genera, MD as Consulting Physician (Hematology)  CHIEF COMPLAINTS/PURPOSE OF CONSULTATION:  F/u for CD5 neg CLL   HISTORY OF PRESENTING ILLNESS:   Javier [name] is a wonderful 79 y.o. male who has been referred to [place] by Dr .Unk Pinto, MD for evaluation and management of lymphocytosis concerning for a lymphoproliferative process.  Patient has a h/o HTN, HLD, Prediabetes who notes that he had significant abdominal discomfort with significant dyspeptic symptoms around aug 2017 and had about 20-25lbs weight loss and had an extensive GI workup including CT abd/pelvis 05/25/2016 - WNL, EGD which showed chronic gastritis (helicobacter pylori +ve) and colonscopy with some polyps. Capsule endoscopy was not approved by insurance. Patients symptoms improved after treatment of H.pylori and he notes that he has gained back about 6-7 lbs and is eating better.  Patients labs were noted to show some leucocytosis/Lymphocytosis for which he was referred to [place] for further evaluation.  Patient notes some nightsweats and weight loss (as noted above, possible alternative explanation). No fevers/chills. Has not noted any overt enlarged LN and CT abd in 04/2016 showed no hepatomegaly or splenomegaly and no abd/RP LNadenopathy.  Patient notes no other acute focal symptoms.   INTERVAL HISTORY Mr Deasis is here for management and evaluation of his CLL. We are joined today by his wife. The patient's last visit with [place] was on 08/03/2019. The pt reports that he is doing well overall.  The pt reports that he has been having some b/l shoulder pain and previously  had knee cramps due to Venetoclax. He has been eating well and even gaining weight. He tries to drink enough water daily but notes that he slacks sometimes. Pt has been walking about 20-30 minutes a day in his yard. Pt has been quite fatigued and becomes out of breath when walking between rooms in his home sometimes. He is able to do the things that we wants to do, but is having to take his time to do so. He feels that his fatigue has been about the same over the last 2 months but Mrs. Girdner notes that the pt is often unable to take any trips shopping without having to sit down or wait in the car.   Lab results today (10/04/19) of CBC w/diff and CMP is as follows: all values are WNL except for Glucose at 112, Total Protein at 6.4. 10/04/2019 LDH at 146 10/04/2019 Reticulocytes is as follows: Retic Ct Pct at 1.1, RBC at 4.48, Retic Count Abs at 47.9, Immature Retic Fract at 11.0  On review of systems, pt reports b/l shoulder aches, fatigue, SOB and denies abdominal pain, leg swelling and any other symptoms.   MEDICAL HISTORY:  Past Medical History:  Diagnosis Date  . Diverticulosis   . Hyperlipidemia   . Hypertension   . Prediabetes   . Vitamin D deficiency   Helicobacter pylori +ve   SURGICAL HISTORY: Past Surgical History:  Procedure Laterality Date  . NO PAST SURGERIES      SOCIAL HISTORY: Social History   Socioeconomic History  . Marital status: Married    Spouse name: Not on file  . Number of children: 5  . Years of education: Not on file  .  Highest education level: Not on file  Occupational History  . Occupation: retired  Tobacco Use  . Smoking status: Former Smoker    Types: Cigarettes    Quit date: 01/17/2003    Years since quitting: 16.7  . Smokeless tobacco: Never Used  Substance and Sexual Activity  . Alcohol use: Yes    Comment: very rare  . Drug use: No  . Sexual activity: Not on file  Other Topics Concern  . Not on file  Social History Narrative  . Not  on file   Social Determinants of Health   Financial Resource Strain:   . Difficulty of Paying Living Expenses: Not on file  Food Insecurity:   . Worried About Charity fundraiser in the Last Year: Not on file  . Ran Out of Food in the Last Year: Not on file  Transportation Needs:   . Lack of Transportation (Medical): Not on file  . Lack of Transportation (Non-Medical): Not on file  Physical Activity:   . Days of Exercise per Week: Not on file  . Minutes of Exercise per Session: Not on file  Stress:   . Feeling of Stress : Not on file  Social Connections:   . Frequency of Communication with Friends and Family: Not on file  . Frequency of Social Gatherings with Friends and Family: Not on file  . Attends Religious Services: Not on file  . Active Member of Clubs or Organizations: Not on file  . Attends Archivist Meetings: Not on file  . Marital Status: Not on file  Intimate Partner Violence:   . Fear of Current or Ex-Partner: Not on file  . Emotionally Abused: Not on file  . Physically Abused: Not on file  . Sexually Abused: Not on file  Quit smoking >10 yrs ago, previously 1.5Packs per week. Started smoking in his teens. Retired. Worked as a Chief Financial Officer.   FAMILY HISTORY: Family History  Problem Relation Age of Onset  . Stroke Mother   . Early death Father        Farm/tractor accident  . Diabetes Brother   . Cirrhosis Brother   . Colon cancer Paternal Grandfather     ALLERGIES:  is allergic to penicillins and ppd [tuberculin purified protein derivative].  MEDICATIONS:  Current Outpatient Medications  Medication Sig Dispense Refill  . ALPRAZolam (XANAX) 0.5 MG tablet Take 1/2-1 tablet 2 - 3 x /day ONLY if needed for Anxiety Attack &  limit to 5 days /week to avoid addiction 90 tablet 0  . Alum & Mag Hydroxide-Simeth (MYLANTA PO) Take by mouth as needed.    Marland Kitchen aspirin EC 81 MG tablet Take 81 mg by mouth daily.    . Cholecalciferol (VITAMIN D3) 5000 units  TABS Take 5,000 Units by mouth daily.    Marland Kitchen losartan-hydrochlorothiazide (HYZAAR) 100-25 MG tablet Take 1 tablet Daily for BP & Fluid 90 tablet 1  . omeprazole (PRILOSEC) 40 MG capsule Take 1 capsule 2 x /day  for Indigestion & Heartburn 180 capsule 3  . pravastatin (PRAVACHOL) 40 MG tablet TAKE 1 TABLET AT BEDTIME  FOR  CHOLESTEROL 90 tablet 3  . sucralfate (CARAFATE) 1 g tablet Take 1 tablet (1 g total) by mouth 4 (four) times daily -  with meals and at bedtime. 120 tablet 1  . triamcinolone (NASACORT) 55 MCG/ACT AERO nasal inhaler Place 2 sprays into the nose at bedtime. 1 Inhaler 3  . venetoclax (VENCLEXTA) 100 MG TABS Take 300 mg by  mouth daily. Take with food and water. 90 tablet 3   No current facility-administered medications for this visit.    REVIEW OF SYSTEMS:   A 10+ POINT REVIEW OF SYSTEMS WAS OBTAINED including neurology, dermatology, psychiatry, cardiac, respiratory, lymph, extremities, GI, GU, Musculoskeletal, constitutional, breasts, reproductive, HEENT.  All pertinent positives are noted in the HPI.  All others are negative.    PHYSICAL EXAMINATION: ECOG PERFORMANCE STATUS: 1 - Symptomatic but completely ambulatory  Vitals:   10/04/19 1048  BP: (!) 141/69  Pulse: 74  Resp: 18  Temp: 97.8 F (36.6 C)  SpO2: 100%   Filed Weights   10/04/19 1048  Weight: 186 lb 14.4 oz (84.8 kg)   .Body mass index is 26.07 kg/m.   GENERAL:alert, in no acute distress and comfortable SKIN: no acute rashes, no significant lesions EYES: conjunctiva are pink and non-injected, sclera anicteric OROPHARYNX: MMM, no exudates, no oropharyngeal erythema or ulceration NECK: supple, no JVD LYMPH:  no palpable lymphadenopathy in the cervical, axillary or inguinal regions LUNGS: clear to auscultation b/l with normal respiratory effort HEART: regular rate & rhythm ABDOMEN:  normoactive bowel sounds , non tender, not distended. No palpable hepatosplenomegaly.  Extremity: no pedal edema PSYCH:  alert & oriented x 3 with fluent speech NEURO: no focal motor/sensory deficits  LABORATORY DATA:  I have reviewed the data as listed  . CBC Latest Ref Rng & Units 10/04/2019 08/03/2019 06/22/2019  WBC 4.0 - 10.5 K/uL 4.4 4.0 3.9(L)  Hemoglobin 13.0 - 17.0 g/dL 13.4 15.5 13.6  Hematocrit 39.0 - 52.0 % 41.4 47.5 42.9  Platelets 150 - 400 K/uL 169 163 146(L)   . CBC    Component Value Date/Time   WBC 4.4 10/04/2019 1026   RBC 4.54 10/04/2019 1026   RBC 4.48 10/04/2019 1026   HGB 13.4 10/04/2019 1026   HGB 12.2 (L) 01/03/2019 1509   HGB 14.5 10/21/2016 1324   HCT 41.4 10/04/2019 1026   HCT 43.8 10/21/2016 1324   PLT 169 10/04/2019 1026   PLT 152 01/03/2019 1509   PLT 157 10/21/2016 1324   MCV 91.2 10/04/2019 1026   MCV 90.9 10/21/2016 1324   MCH 29.5 10/04/2019 1026   MCHC 32.4 10/04/2019 1026   RDW 13.3 10/04/2019 1026   RDW 13.3 10/21/2016 1324   LYMPHSABS 1.3 10/04/2019 1026   LYMPHSABS 17.6 (H) 10/21/2016 1324   MONOABS 0.5 10/04/2019 1026   MONOABS 0.6 10/21/2016 1324   EOSABS 0.0 10/04/2019 1026   EOSABS 0.0 10/21/2016 1324   BASOSABS 0.0 10/04/2019 1026   BASOSABS 0.1 10/21/2016 1324     . CMP Latest Ref Rng & Units 10/04/2019 08/03/2019 06/22/2019  Glucose 70 - 99 mg/dL 112(H) 92 104(H)  BUN 8 - 23 mg/dL '15 14 16  ' Creatinine 0.61 - 1.24 mg/dL 1.10 1.18 1.15  Sodium 135 - 145 mmol/L 143 141 143  Potassium 3.5 - 5.1 mmol/L 3.7 3.8 3.8  Chloride 98 - 111 mmol/L 108 105 111  CO2 22 - 32 mmol/L '26 25 24  ' Calcium 8.9 - 10.3 mg/dL 9.4 10.2 9.3  Total Protein 6.5 - 8.1 g/dL 6.4(L) 7.6 6.6  Total Bilirubin 0.3 - 1.2 mg/dL 0.3 0.8 0.3  Alkaline Phos 38 - 126 U/L 88 83 76  AST 15 - 41 U/L 16 16 14(L)  ALT 0 - 44 U/L '13 13 8   ' . Lab Results  Component Value Date   LDH 146 10/04/2019         RADIOGRAPHIC  STUDIES: I have personally reviewed the radiological images as listed and agreed with the findings in the report. No results found.  ASSESSMENT & PLAN:    79 y.o. 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 FISH panel showed p53 (17p13) deletion that would be consistent with this diagnosis . It often represents also be more aggressive form of CLL Differential diagnosis includes - CD5 neg CLL vs splenic lymphoma vs Marginal Zone lymphoma vs LPL (lymphoplasmacytic lymphoma) LDH WNL suggests against a high grade process. Currently no anemia or thrombocytopenia noted. Now with drenching night sweats and fatigue, no fevers/chills.  10/31/16 PET/CT scan shows some small hypermetabolic nodes in the thorax including bilateral hilar paratracheal) esophageal. These would be difficult to biopsy.  Lost to follow up between 11/06/16 and 11/19/18  12/01/18 PET/CT revealed Stable scattered small hypermetabolic thoracic lymph nodes, once again at [place] 5 activity level. 2. No findings of involvement in the neck, abdomen/pelvis, or skeleton. 3. Prostatomegaly. 4. Aortic Atherosclerosis and Emphysema 5. Sigmoid colon diverticulosis  PLAN: -Discussed pt labwork today, 10/04/19; all values are WNL except for Glucose at 112, Total Protein at 6.4. -Discussed 10/04/2019 LDH is WNL -Discussed 10/04/2019 Reticulocytes is as follows: all values are WNL -Recommended that the pt continue to eat well, drink at least 48-64 oz of water each day, and walk 20-30 minutes each day.  -The pt shows no overt clinical or lab progression of his CLL at this time.  -Pt has Grade 2 fatigue -Will lower dose of Venetoclax to 300 mg and re-evaluate in 2 months  -The pt has no prohibitive toxicities from continuing 300 mg Venetoclax at this time  -Will continue Venetoclax for up to a year, as long as pt tolerates it and till there is no CLL progression.  -Will consider stopping maintenance Venetoclax after 1 year of treatment If labs are stable -Will see back in 2 months with labs    2) . Patient Active Problem List   Diagnosis Date  Noted  . Aortic atherosclerosis ([place]) 08/23/2019  . Memory changes 05/25/2019  . Counseling regarding advance care planning and goals of care 01/24/2019  . Anxiety 02/08/2018  . CLL (chronic lymphocytic leukemia) ([place]) 11/01/2016  . Encounter for Medicare annual wellness exam 09/11/2015  . GERD  02/09/2015  . Medication management 11/02/2013  . Hyperlipidemia   . Hypertension   . Abnormal glucose   . Vitamin D deficiency   . Diverticulosis    -f/u with PCP for management of other medical co-morbids  3) H.pylori gastritis -f/u with GI to ensure eradication of h.pylori post treatment.   FOLLOW UP: RTC with Dr Irene Limbo with labs in 2 months   The total time spent in the appt was 15 minutes and more than 50% was on counseling and direct patient cares.  All of the patient's questions were answered with apparent satisfaction. The patient knows to call the clinic with any problems, questions or concerns.   Sullivan Lone MD [place] AAHIVMS [facility] Hematology/Oncology Physician [facility]  (Office):       [phone] (Work cell):  [phone] (Fax):           [phone]  10/04/2019 12:25 PM  I, Yevette Edwards, am acting as a scribe for Dr. Sullivan Lone.   .I have reviewed the above documentation for accuracy and completeness, and I agree with the above. Brunetta Genera MD

## 2019-10-04 ENCOUNTER — Encounter: Payer: Self-pay | Admitting: Hematology

## 2019-10-04 ENCOUNTER — Other Ambulatory Visit: Payer: Self-pay

## 2019-10-04 ENCOUNTER — Inpatient Hospital Stay (HOSPITAL_BASED_OUTPATIENT_CLINIC_OR_DEPARTMENT_OTHER): Payer: Medicare HMO | Admitting: Hematology

## 2019-10-04 ENCOUNTER — Inpatient Hospital Stay: Payer: Medicare HMO | Attending: Hematology

## 2019-10-04 DIAGNOSIS — C9110 Chronic lymphocytic leukemia of B-cell type not having achieved remission: Secondary | ICD-10-CM

## 2019-10-04 DIAGNOSIS — Z79899 Other long term (current) drug therapy: Secondary | ICD-10-CM | POA: Insufficient documentation

## 2019-10-04 DIAGNOSIS — Z5112 Encounter for antineoplastic immunotherapy: Secondary | ICD-10-CM

## 2019-10-04 LAB — CMP (CANCER CENTER ONLY)
ALT: 13 U/L (ref 0–44)
AST: 16 U/L (ref 15–41)
Albumin: 3.7 g/dL (ref 3.5–5.0)
Alkaline Phosphatase: 88 U/L (ref 38–126)
Anion gap: 9 (ref 5–15)
BUN: 15 mg/dL (ref 8–23)
CO2: 26 mmol/L (ref 22–32)
Calcium: 9.4 mg/dL (ref 8.9–10.3)
Chloride: 108 mmol/L (ref 98–111)
Creatinine: 1.1 mg/dL (ref 0.61–1.24)
GFR, Est AFR Am: >60 mL/min (ref >60)
GFR, Estimated: >60 mL/min (ref >60)
Glucose, Bld: 112 mg/dL — ABNORMAL HIGH (ref 70–99)
Potassium: 3.7 mmol/L (ref 3.5–5.1)
Sodium: 143 mmol/L (ref 135–145)
Total Bilirubin: 0.3 mg/dL (ref 0.3–1.2)
Total Protein: 6.4 g/dL — ABNORMAL LOW (ref 6.5–8.1)

## 2019-10-04 LAB — CBC WITH DIFFERENTIAL/PLATELET
Abs Immature Granulocytes: 0.03 10*3/uL (ref 0.00–0.07)
Basophils Absolute: 0 10*3/uL (ref 0.0–0.1)
Basophils Relative: 0 %
Eosinophils Absolute: 0 10*3/uL (ref 0.0–0.5)
Eosinophils Relative: 0 %
HCT: 41.4 % (ref 39.0–52.0)
Hemoglobin: 13.4 g/dL (ref 13.0–17.0)
Immature Granulocytes: 1 %
Lymphocytes Relative: 30 %
Lymphs Abs: 1.3 10*3/uL (ref 0.7–4.0)
MCH: 29.5 pg (ref 26.0–34.0)
MCHC: 32.4 g/dL (ref 30.0–36.0)
MCV: 91.2 fL (ref 80.0–100.0)
Monocytes Absolute: 0.5 10*3/uL (ref 0.1–1.0)
Monocytes Relative: 11 %
Neutro Abs: 2.6 10*3/uL (ref 1.7–7.7)
Neutrophils Relative %: 58 %
Platelets: 169 10*3/uL (ref 150–400)
RBC: 4.54 MIL/uL (ref 4.22–5.81)
RDW: 13.3 % (ref 11.5–15.5)
WBC: 4.4 10*3/uL (ref 4.0–10.5)
nRBC: 0 % (ref 0.0–0.2)

## 2019-10-04 LAB — RETICULOCYTES
Immature Retic Fract: 11 % (ref 2.3–15.9)
RBC.: 4.48 MIL/uL (ref 4.22–5.81)
Retic Count, Absolute: 47.9 K/uL (ref 19.0–186.0)
Retic Ct Pct: 1.1 % (ref 0.4–3.1)

## 2019-10-04 LAB — LACTATE DEHYDROGENASE: LDH: 146 U/L (ref 98–192)

## 2019-10-04 MED ORDER — VENCLEXTA 100 MG PO TABS: 300.0000 mg | ORAL_TABLET | Freq: Every day | ORAL | 3 refills | Status: DC

## 2019-10-05 ENCOUNTER — Telehealth: Payer: Self-pay | Admitting: Hematology

## 2019-10-05 NOTE — Telephone Encounter (Signed)
Scheduled appt per 1/5 los, sent a message to HIM pool to get a calendar mailed out. 

## 2019-10-10 ENCOUNTER — Other Ambulatory Visit: Payer: Self-pay | Admitting: Internal Medicine

## 2019-11-09 ENCOUNTER — Telehealth: Payer: Self-pay

## 2019-11-09 MED FILL — VENCLEXTA 100 MG TABS: 100 | 30 days supply | Qty: 90 | Fill #0

## 2019-11-09 NOTE — Telephone Encounter (Signed)
Oral Oncology Patient Advocate Encounter   Was successful in securing patient an $79 grant from Patient [facility]) to provide copayment coverage for Venclexta.  This will keep the out of pocket expense at $0.     I have spoken with the patient.    The billing information is as follows and has been shared with [place].   Member ID: [id number] Group ID: [id number] RxBin: [id number] Dates of Eligibility: 08/11/19 through 11/07/20  Fund:  [place] Patient [place] Phone [phone] Fax [phone] 11/09/2019 10:28 AM

## 2019-11-09 NOTE — Telephone Encounter (Signed)
Oral Oncology Patient Advocate Encounter  Was successful in securing patient a $8000 grant from [employer] to provide copayment coverage for Venclexta.  This will keep the out of pocket expense at $0.     [employer] ID: [id number]  I have spoken with the patient.   The billing information is as follows and has been shared with [place].    RxBin: [id number] PCN: [id number] Member ID: [id number] Group ID: [id number] Dates of Eligibility: 11/14/19 through 11/12/20  [place] Patient [place] Phone [phone] Fax [phone] 11/09/2019 10:27 AM

## 2019-11-24 DIAGNOSIS — H40013 Open angle with borderline findings, low risk, bilateral: Secondary | ICD-10-CM | POA: Diagnosis not present

## 2019-11-24 DIAGNOSIS — H2513 Age-related nuclear cataract, bilateral: Secondary | ICD-10-CM | POA: Diagnosis not present

## 2019-11-24 DIAGNOSIS — H5203 Hypermetropia, bilateral: Secondary | ICD-10-CM | POA: Diagnosis not present

## 2019-12-02 ENCOUNTER — Other Ambulatory Visit: Payer: Self-pay

## 2019-12-02 ENCOUNTER — Inpatient Hospital Stay: Payer: Medicare HMO | Attending: Hematology | Admitting: Hematology

## 2019-12-02 ENCOUNTER — Inpatient Hospital Stay: Payer: Medicare HMO

## 2019-12-02 VITALS — BP 148/81 | HR 78 | Temp 98.3°F | Resp 18 | Ht 71.0 in | Wt 186.5 lb

## 2019-12-02 DIAGNOSIS — C9110 Chronic lymphocytic leukemia of B-cell type not having achieved remission: Secondary | ICD-10-CM | POA: Insufficient documentation

## 2019-12-02 DIAGNOSIS — Z5112 Encounter for antineoplastic immunotherapy: Secondary | ICD-10-CM

## 2019-12-02 LAB — RETICULOCYTES
Immature Retic Fract: 10.6 % (ref 2.3–15.9)
RBC.: 4.62 MIL/uL (ref 4.22–5.81)
Retic Count, Absolute: 59.6 10*3/uL (ref 19.0–186.0)
Retic Ct Pct: 1.3 % (ref 0.4–3.1)

## 2019-12-02 LAB — CBC WITH DIFFERENTIAL/PLATELET
Abs Immature Granulocytes: 0.02 10*3/uL (ref 0.00–0.07)
Basophils Absolute: 0 10*3/uL (ref 0.0–0.1)
Basophils Relative: 0 %
Eosinophils Absolute: 0 10*3/uL (ref 0.0–0.5)
Eosinophils Relative: 0 %
HCT: 43.6 % (ref 39.0–52.0)
Hemoglobin: 14.2 g/dL (ref 13.0–17.0)
Immature Granulocytes: 0 %
Lymphocytes Relative: 33 %
Lymphs Abs: 1.5 10*3/uL (ref 0.7–4.0)
MCH: 30 pg (ref 26.0–34.0)
MCHC: 32.6 g/dL (ref 30.0–36.0)
MCV: 92.2 fL (ref 80.0–100.0)
Monocytes Absolute: 0.7 10*3/uL (ref 0.1–1.0)
Monocytes Relative: 16 %
Neutro Abs: 2.4 10*3/uL (ref 1.7–7.7)
Neutrophils Relative %: 51 %
Platelets: 169 10*3/uL (ref 150–400)
RBC: 4.73 MIL/uL (ref 4.22–5.81)
RDW: 13 % (ref 11.5–15.5)
WBC: 4.6 10*3/uL (ref 4.0–10.5)
nRBC: 0 % (ref 0.0–0.2)

## 2019-12-02 LAB — CMP (CANCER CENTER ONLY)
ALT: 12 U/L (ref 0–44)
AST: 14 U/L — ABNORMAL LOW (ref 15–41)
Albumin: 3.8 g/dL (ref 3.5–5.0)
Alkaline Phosphatase: 85 U/L (ref 38–126)
Anion gap: 8 (ref 5–15)
BUN: 14 mg/dL (ref 8–23)
CO2: 27 mmol/L (ref 22–32)
Calcium: 9.9 mg/dL (ref 8.9–10.3)
Chloride: 109 mmol/L (ref 98–111)
Creatinine: 1.31 mg/dL — ABNORMAL HIGH (ref 0.61–1.24)
GFR, Est AFR Am: >60 mL/min (ref >60)
GFR, Estimated: 52 mL/min — ABNORMAL LOW (ref >60)
Glucose, Bld: 83 mg/dL (ref 70–99)
Potassium: 4.2 mmol/L (ref 3.5–5.1)
Sodium: 144 mmol/L (ref 135–145)
Total Bilirubin: 0.7 mg/dL (ref 0.3–1.2)
Total Protein: 6.7 g/dL (ref 6.5–8.1)

## 2019-12-02 LAB — LACTATE DEHYDROGENASE: LDH: 165 U/L (ref 98–192)

## 2019-12-02 NOTE — Progress Notes (Signed)
HEMATOLOGY/ONCOLOGY CLINIC NOTE  Date of Service: 12/02/19   Patient Care Team: Unk Pinto, MD as PCP - General (Internal Medicine) Inda Castle, MD (Inactive) as Consulting Physician (Gastroenterology) Melissa Noon, [place] as Referring Physician (Optometry) Danis, Kirke Corin, MD as Consulting Physician (Gastroenterology) Brunetta Genera, MD as Consulting Physician (Hematology)  CHIEF COMPLAINTS/PURPOSE OF CONSULTATION:  F/u for CD5 neg CLL   HISTORY OF PRESENTING ILLNESS:   Javier [name] is a wonderful 79 y.o. male who has been referred to [place] by Dr .Unk Pinto, MD for evaluation and management of lymphocytosis concerning for a lymphoproliferative process.  Patient has a h/o HTN, HLD, Prediabetes who notes that he had significant abdominal discomfort with significant dyspeptic symptoms around aug 2017 and had about 20-25lbs weight loss and had an extensive GI workup including CT abd/pelvis 05/25/2016 - WNL, EGD which showed chronic gastritis (helicobacter pylori +ve) and colonscopy with some polyps. Capsule endoscopy was not approved by insurance. Patients symptoms improved after treatment of H.pylori and he notes that he has gained back about 6-7 lbs and is eating better.  Patients labs were noted to show some leucocytosis/Lymphocytosis for which he was referred to [place] for further evaluation.  Patient notes some nightsweats and weight loss (as noted above, possible alternative explanation). No fevers/chills. Has not noted any overt enlarged LN and CT abd in 04/2016 showed no hepatomegaly or splenomegaly and no abd/RP LNadenopathy.  Patient notes no other acute focal symptoms.   INTERVAL   [name] is here for management and evaluation of his CLL. We are joined today by his wife. The patient's last visit with [place] was on 10/04/2019. The pt reports that he is doing well overall.  The pt reports he has been feeling weak especially in the morning. He has been  very fatigued, but he can push through the fatigue sometimes. He has been walking several times a day, and has no balance problems. Pt reports he has been eating and sleeping well. He has hardly been Xanax maybe twice a week. The fatigue has been changing his quality of life and affecting things he would like to do in the day. Pt has gotten both doses of the COVID19 vaccine.   Lab results today (12/02/19) of CBC w/diff and CMP is as follows: all values are WNL except for Creatinine of 1.31, AST at 14, GFR, Est non Af Am at 52. 12/02/2019 of LDH at 165 12/02/2019 of Reticulocytes: all values are WNL  On review of systems, pt reports heathy appetite, sleeping well and denies SOB, chest pain, knee pain, back pain, nausea, vomiting, diarrhea, fever, chills, infection issues, memory problems and any other symptoms.    MEDICAL HISTORY:  Past Medical History:  Diagnosis Date  . Diverticulosis   . Hyperlipidemia   . Hypertension   . Prediabetes   . Vitamin D deficiency   Helicobacter pylori +ve   SURGICAL HISTORY: Past Surgical History:  Procedure Laterality Date  . NO PAST SURGERIES      SOCIAL HISTORY: Social History   Socioeconomic History  . Marital status: Married    Spouse name: Not on file  . Number of children: 5  . Years of education: Not on file  . Highest education level: Not on file  Occupational History  . Occupation: retired  Tobacco Use  . Smoking status: Former Smoker    Types: Cigarettes    Quit date: 01/17/2003    Years since quitting: 16.8  . Smokeless  tobacco: Never Used  Substance and Sexual Activity  . Alcohol use: Yes    Comment: very rare  . Drug use: No  . Sexual activity: Not on file  Other Topics Concern  . Not on file  Social History Narrative  . Not on file   Social Determinants of Health   Financial Resource Strain:   . Difficulty of Paying Living Expenses: Not on file  Food Insecurity:   . Worried About Charity fundraiser in the Last  Year: Not on file  . Ran Out of Food in the Last Year: Not on file  Transportation Needs:   . Lack of Transportation (Medical): Not on file  . Lack of Transportation (Non-Medical): Not on file  Physical Activity:   . Days of Exercise per Week: Not on file  . Minutes of Exercise per Session: Not on file  Stress:   . Feeling of Stress : Not on file  Social Connections:   . Frequency of Communication with Friends and Family: Not on file  . Frequency of Social Gatherings with Friends and Family: Not on file  . Attends Religious Services: Not on file  . Active Member of Clubs or Organizations: Not on file  . Attends Archivist Meetings: Not on file  . Marital Status: Not on file  Intimate Partner Violence:   . Fear of Current or Ex-Partner: Not on file  . Emotionally Abused: Not on file  . Physically Abused: Not on file  . Sexually Abused: Not on file  Quit smoking >10 yrs ago, previously 1.5Packs per week. Started smoking in his teens. Retired. Worked as a Chief Financial Officer.   FAMILY HISTORY: Family History  Problem Relation Age of Onset  . Stroke Mother   . Early death Father        Farm/tractor accident  . Diabetes Brother   . Cirrhosis Brother   . Colon cancer Paternal Grandfather     ALLERGIES:  is allergic to penicillins and ppd [tuberculin purified protein derivative].  MEDICATIONS:  Current Outpatient Medications  Medication Sig Dispense Refill  . ALPRAZolam (XANAX) 0.5 MG tablet TAKE 1/2 -1 TABLET BY MOUTH 2-3 TIMES A DAY ONLY IF NEEDED FOR ANXIETY ATTACK AND LIMIT TO 5 DAYS A WEEK TO AVOID ADDICTION 90 tablet 0  . Alum & Mag Hydroxide-Simeth (MYLANTA PO) Take by mouth as needed.    Marland Kitchen aspirin EC 81 MG tablet Take 81 mg by mouth daily.    . Cholecalciferol (VITAMIN D3) 5000 units TABS Take 5,000 Units by mouth daily.    Marland Kitchen losartan-hydrochlorothiazide (HYZAAR) 100-25 MG tablet Take 1 tablet Daily for BP & Fluid 90 tablet 1  . omeprazole (PRILOSEC) 40 MG  capsule Take 1 capsule 2 x /day  for Indigestion & Heartburn 180 capsule 3  . pravastatin (PRAVACHOL) 40 MG tablet TAKE 1 TABLET AT BEDTIME  FOR  CHOLESTEROL 90 tablet 3  . sucralfate (CARAFATE) 1 g tablet Take 1 tablet (1 g total) by mouth 4 (four) times daily -  with meals and at bedtime. 120 tablet 1  . triamcinolone (NASACORT) 55 MCG/ACT AERO nasal inhaler Place 2 sprays into the nose at bedtime. 1 Inhaler 3  . venetoclax (VENCLEXTA) 100 MG TABS Take 300 mg by mouth daily. Take with food and water. 90 tablet 3   No current facility-administered medications for this visit.    REVIEW OF SYSTEMS:   A 10+ POINT REVIEW OF SYSTEMS WAS OBTAINED including neurology, dermatology, psychiatry, cardiac, respiratory,  lymph, extremities, GI, GU, Musculoskeletal, constitutional, breasts, reproductive, HEENT.  All pertinent positives are noted in the HPI.  All others are negative.   PHYSICAL EXAMINATION: ECOG PERFORMANCE STATUS: 1 - Symptomatic but completely ambulatory  Vitals:   12/02/19 1237  BP: (!) 148/81  Pulse: 78  Resp: 18  Temp: 98.3 F (36.8 C)  SpO2: 98%   Filed Weights   12/02/19 1237  Weight: 186 lb 8 oz (84.6 kg)   .Body mass index is 26.01 kg/m.   GENERAL:alert, in no acute distress and comfortable SKIN: no acute rashes, no significant lesions EYES: conjunctiva are pink and non-injected, sclera anicteric OROPHARYNX: MMM, no exudates, no oropharyngeal erythema or ulceration NECK: supple, no JVD LYMPH:  no palpable lymphadenopathy in the cervical, axillary or inguinal regions LUNGS: clear to auscultation b/l with normal respiratory effort HEART: regular rate & rhythm ABDOMEN:  normoactive bowel sounds , non tender, not distended. Extremity: no pedal edema PSYCH: alert & oriented x 3 with fluent speech NEURO: no focal motor/sensory deficits  LABORATORY DATA:  I have reviewed the data as listed  . CBC Latest Ref Rng & Units 12/02/2019 10/04/2019 08/03/2019  WBC 4.0 - 10.5  K/uL 4.6 4.4 4.0  Hemoglobin 13.0 - 17.0 g/dL 14.2 13.4 15.5  Hematocrit 39.0 - 52.0 % 43.6 41.4 47.5  Platelets 150 - 400 K/uL 169 169 163   . CBC    Component Value Date/Time   WBC 4.6 12/02/2019 1120   RBC 4.62 12/02/2019 1120   RBC 4.73 12/02/2019 1120   HGB 14.2 12/02/2019 1120   HGB 12.2 (L) 01/03/2019 1509   HGB 14.5 10/21/2016 1324   HCT 43.6 12/02/2019 1120   HCT 43.8 10/21/2016 1324   PLT 169 12/02/2019 1120   PLT 152 01/03/2019 1509   PLT 157 10/21/2016 1324   MCV 92.2 12/02/2019 1120   MCV 90.9 10/21/2016 1324   MCH 30.0 12/02/2019 1120   MCHC 32.6 12/02/2019 1120   RDW 13.0 12/02/2019 1120   RDW 13.3 10/21/2016 1324   LYMPHSABS 1.5 12/02/2019 1120   LYMPHSABS 17.6 (H) 10/21/2016 1324   MONOABS 0.7 12/02/2019 1120   MONOABS 0.6 10/21/2016 1324   EOSABS 0.0 12/02/2019 1120   EOSABS 0.0 10/21/2016 1324   BASOSABS 0.0 12/02/2019 1120   BASOSABS 0.1 10/21/2016 1324     . CMP Latest Ref Rng & Units 12/02/2019 10/04/2019 08/03/2019  Glucose 70 - 99 mg/dL 83 112(H) 92  BUN 8 - 23 mg/dL '14 15 14  ' Creatinine 0.61 - 1.24 mg/dL 1.31(H) 1.10 1.18  Sodium 135 - 145 mmol/L 144 143 141  Potassium 3.5 - 5.1 mmol/L 4.2 3.7 3.8  Chloride 98 - 111 mmol/L 109 108 105  CO2 22 - 32 mmol/L '27 26 25  ' Calcium 8.9 - 10.3 mg/dL 9.9 9.4 10.2  Total Protein 6.5 - 8.1 g/dL 6.7 6.4(L) 7.6  Total Bilirubin 0.3 - 1.2 mg/dL 0.7 0.3 0.8  Alkaline Phos 38 - 126 U/L 85 88 83  AST 15 - 41 U/L 14(L) 16 16  ALT 0 - 44 U/L '12 13 13   ' . Lab Results  Component Value Date   LDH 165 12/02/2019         RADIOGRAPHIC STUDIES: I have personally reviewed the radiological images as listed and agreed with the findings in the report. No results found.  ASSESSMENT & PLAN:   79 y.o. 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 FISH  panel showed p53 (17p13) deletion that would be consistent with this diagnosis . It often represents also be more  aggressive form of CLL Differential diagnosis includes - CD5 neg CLL vs splenic lymphoma vs Marginal Zone lymphoma vs LPL (lymphoplasmacytic lymphoma) LDH WNL suggests against a high grade process. Currently no anemia or thrombocytopenia noted. Now with drenching night sweats and fatigue, no fevers/chills.  10/31/16 PET/CT scan shows some small hypermetabolic nodes in the thorax including bilateral hilar paratracheal) esophageal. These would be difficult to biopsy.  Lost to follow up between 11/06/16 and 11/19/18  12/01/18 PET/CT revealed Stable scattered small hypermetabolic thoracic lymph nodes, once again at [place] 5 activity level. 2. No findings of involvement in the neck, abdomen/pelvis, or skeleton. 3. Prostatomegaly. 4. Aortic Atherosclerosis and Emphysema 5. Sigmoid colon diverticulosis  PLAN: -Discussed pt labwork today, 12/02/19; CBC w/diff and CMP is as follows: all values are WNL except for Creatinine of 1.31, AST at 14, GFR, Est non Af Am at 52. -Discussed 12/02/2019 of LDH at 165 -Discussed 12/02/2019 of Reticulocytes: all values are WNL -Advised on fatigue coming from medications -Recommended that the pt continue to eat well, drink at least 48-64 oz of water each day, and walk 20-30 minutes each day.  -Recommends cutting down cholesterol medication  -The pt shows no overt clinical or lab progression of his CLL at this time.  -The pt has no prohibitive toxicities from continuing Venetoclax at this time  -Will continue Venetoclax for up to a year, as long as pt tolerates it and till there is no CLL progression.  -Will consider stopping maintenance Venetoclax after 1 year of treatment If labs are stable -f/u with PCP -to address grade 2-3 fatigue will stop Venetoclax for 2 weeks and then restart at 265m po once a day. -Will see back in 3 months with labs    2) . Patient Active Problem List   Diagnosis Date Noted  . [place]ortic atherosclerosis ([place] 08/23/2019  . Memory changes  05/25/2019  . Counseling regarding advance care planning and goals of care 01/24/2019  . Anxiety 02/08/2018  . CLL (chronic lymphocytic leukemia) ([facility] 11/01/2016  . Encounter for Medicare annual wellness exam 09/11/2015  . GERD  02/09/2015  . Medication management 11/02/2013  . Hyperlipidemia   . Hypertension   . Abnormal glucose   . Vitamin D deficiency   . Diverticulosis    -f/u with PCP for management of other medical co-morbids  3) H.pylori gastritis -f/u with GI to ensure eradication of h.pylori post treatment.   FOLLOW UP: RTC with Dr KIrene Limbowith labs in 2 months    The total time spent in the appt was 20 minutes and more than 50% was on counseling and direct patient cares.  All of the patient's questions were answered with apparent satisfaction. The patient knows to call the clinic with any problems, questions or concerns.   GSullivan LoneMD MIsmayAAHIVMS SEastern Idaho Regional Medical CenterCThe Hospitals Of Providence East CampusHematology/Oncology Physician CDuncan Regional Hospital (Office):       [phone](Work cell):  [phone](Fax):           [phone] 12/02/2019 8:56 AM  I, EDawayne Cirriam acting as a scribe for Dr. GSullivan Lone   .I have reviewed the above documentation for accuracy and completeness, and I agree with the above. .Brunetta GeneraMD

## 2019-12-05 ENCOUNTER — Encounter: Payer: Self-pay | Admitting: Internal Medicine

## 2019-12-05 NOTE — Patient Instructions (Signed)

## 2019-12-05 NOTE — Progress Notes (Signed)
Annual  Screening/Preventative Visit  & Comprehensive Evaluation & Examination     This very nice 79 y.o. MBM presents for a Screening /Preventative Visit & comprehensive evaluation and management of multiple medical co-morbidities.  Patient has been followed for HTN, HLD, Prediabetes and Vitamin D Deficiency. Patient has GERD controlled on Omeprazole.      Patient is followed by Dr Irene Limbo for  CLL in remission on Venetoclax (Normal CBC/diff on March 5).      HTN predates circa 2004. Patient's BP has been controlled at home.  Today's BP is at goal - 138/82.  Patient has Aortic Atherosclerosis by CT scan. Patient denies any cardiac symptoms as chest pain, palpitations, shortness of breath, dizziness or ankle swelling.     Patient's hyperlipidemia is controlled with diet and Pravastatin. Patient denies myalgias or other medication SE's. Last lipids were at goal:  Lab Results  Component Value Date   CHOL 148 08/29/2019   HDL 40 08/29/2019   LDLCALC 89 08/29/2019   TRIG 98 08/29/2019   CHOLHDL 3.7 08/29/2019       Patient has hx/o prediabetes (A1c 5.8% / 2010) and patient denies reactive hypoglycemic symptoms, visual blurring, diabetic polys or paresthesias. Last A1c was Normal & at goal:  Lab Results  Component Value Date   HGBA1C 5.4 09/15/2018        Finally, patient has history of Vitamin D Deficiency and last vitamin D was at goal:  Lab Results  Component Value Date   VD25OH 64 08/29/2019    Current Outpatient Medications on File Prior to Visit  Medication Sig  . ALPRAZolam (XANAX) 0.5 MG tablet TAKE 1/2 -1 TABLET BY MOUTH 2-3 TIMES A DAY ONLY IF NEEDED FOR ANXIETY ATTACK AND LIMIT TO 5 DAYS A WEEK TO AVOID ADDICTION  . Alum & Mag Hydroxide-Simeth (MYLANTA PO) Take by mouth as needed.  Marland Kitchen aspirin EC 81 MG tablet Take 81 mg by mouth daily.  . Cholecalciferol (VITAMIN D3) 5000 units TABS Take 5,000 Units by mouth daily.  Marland Kitchen losartan-hydrochlorothiazide (HYZAAR) 100-25 MG  tablet Take 1 tablet Daily for BP & Fluid  . omeprazole (PRILOSEC) 40 MG capsule Take 1 capsule 2 x /day  for Indigestion & Heartburn  . pravastatin (PRAVACHOL) 40 MG tablet TAKE 1 TABLET AT BEDTIME  FOR  CHOLESTEROL  . sucralfate (CARAFATE) 1 g tablet Take 1 tablet (1 g total) by mouth 4 (four) times daily -  with meals and at bedtime.  . triamcinolone (NASACORT) 55 MCG/ACT AERO nasal inhaler Place 2 sprays into the nose at bedtime.  Marland Kitchen venetoclax (VENCLEXTA) 100 MG TABS Take 300 mg by mouth daily. Take with food and water.   No current facility-administered medications on file prior to visit.   Allergies  Allergen Reactions  . Penicillins Other (See Comments)    Unknown allergic reaction as a teenager  . Ppd [Tuberculin Purified Protein Derivative]     Positive test in 2004   Past Medical History:  Diagnosis Date  . Diverticulosis   . Hyperlipidemia   . Hypertension   . Prediabetes   . Vitamin D deficiency    Health Maintenance  Topic Date Due  . COLONOSCOPY  05/22/2017  . INFLUENZA VACCINE  04/30/2019  . TETANUS/TDAP  09/21/2019  . PNA vac Low Risk Adult  Completed   Immunization History  Administered Date(s) Administered  . Influenza Split 07/28/2013  . Influenza, High Dose Seasonal PF 09/11/2015, 06/09/2016, 09/15/2018  . Pneumococcal Conjugate-13 11/05/2017  . Pneumococcal-Unspecified  07/01/1999, 09/20/2009  . Td 09/20/2009  . Zoster 11/08/2009   Last Colon -  05/22/2014 - Dr Deatra Ina - Recc 3 year f/u - due Sept 2018 - Overdue  Past Surgical History:  Procedure Laterality Date  . NO PAST SURGERIES     Family History  Problem Relation Age of Onset  . Stroke Mother   . Early death Father        Farm/tractor accident  . Diabetes Brother   . Cirrhosis Brother   . Colon cancer Paternal Grandfather    Social History   Socioeconomic History  . Marital status: Married    Spouse name: [name]  . Number of children: 5  . Years of education: Not on file  .  Highest education level: Not on file  Occupational History  . Occupation: retired  Tobacco Use  . Smoking status: Former Smoker    Types: Cigarettes    Quit date: 01/17/2003    Years since quitting: 16.8  . Smokeless tobacco: Never Used  Substance and Sexual Activity  . Alcohol use: Yes    Comment: very rare  . Drug use: No  . Sexual activity: Not on file     ROS Constitutional: Denies fever, chills, weight loss/gain, headaches, insomnia,  night sweats or change in appetite. Does c/o fatigue. Eyes: Denies redness, blurred vision, diplopia, discharge, itchy or watery eyes.  ENT: Denies discharge, congestion, post nasal drip, epistaxis, sore throat, earache, hearing loss, dental pain, Tinnitus, Vertigo, Sinus pain or snoring.  Cardio: Denies chest pain, palpitations, irregular heartbeat, syncope, dyspnea, diaphoresis, orthopnea, PND, claudication or edema Respiratory: denies cough, dyspnea, DOE, pleurisy, hoarseness, laryngitis or wheezing.  Gastrointestinal: Denies dysphagia, heartburn, reflux, water brash, pain, cramps, nausea, vomiting, bloating, diarrhea, constipation, hematemesis, melena, hematochezia, jaundice or hemorrhoids Genitourinary: Denies dysuria, frequency, urgency, nocturia, hesitancy, discharge, hematuria or flank pain Musculoskeletal: Denies arthralgia, myalgia, stiffness, Jt. Swelling, pain, limp or strain/sprain. Denies Falls. Skin: Denies puritis, rash, hives, warts, acne, eczema or change in skin lesion Neuro: No weakness, tremor, incoordination, spasms, paresthesia or pain Psychiatric: Denies confusion, memory loss or sensory loss. Denies Depression. Endocrine: Denies change in weight, skin, hair change, nocturia, and paresthesia, diabetic polys, visual blurring or hyper / hypo glycemic episodes.  Heme/Lymph: No excessive bleeding, bruising or enlarged lymph nodes.  Physical Exam  BP 138/82   Pulse 64   Temp (!) 97.1 F (36.2 C)   Ht 5\' 11"  (1.803 m)   Wt 192  lb 12.8 oz (87.5 kg)   BMI 26.89 kg/m   General Appearance: Well nourished and well groomed and in no apparent distress.  Eyes: PERRLA, EOMs, conjunctiva no swelling or erythema, normal fundi and vessels. Sinuses: No frontal/maxillary tenderness ENT/Mouth: EACs patent / TMs  nl. Nares clear without erythema, swelling, mucoid exudates. Oral hygiene is good. No erythema, swelling, or exudate. Tongue normal, non-obstructing. Tonsils not swollen or erythematous. Hearing normal.  Neck: Supple, thyroid not palpable. No bruits, nodes or JVD. Respiratory: Respiratory effort normal.  BS equal and clear bilateral without rales, rhonci, wheezing or stridor. Cardio: Heart sounds are normal with regular rate and rhythm and no murmurs, rubs or gallops. Peripheral pulses are normal and equal bilaterally without edema. No aortic or femoral bruits. Chest: symmetric with normal excursions and percussion.  Abdomen: Soft, with Nl bowel sounds. Nontender, no guarding, rebound, hernias, masses, or organomegaly.  Lymphatics: Non tender without lymphadenopathy.  Musculoskeletal: Full ROM all peripheral extremities, joint stability, 5/5 strength, and normal gait. Skin: Warm and dry  without rashes, lesions, cyanosis, clubbing or  ecchymosis.  Neuro: Cranial nerves intact, reflexes equal bilaterally. Normal muscle tone, no cerebellar symptoms. Sensation intact.  Pysch: Alert and oriented X 3 with normal affect, insight and judgment appropriate.   Assessment and Plan  1. Annual Preventative/Screening Exam   2. Essential hypertension  - EKG 12-Lead - [place], RETROPERITNL ABD,  LTD - Urinalysis, Routine w reflex microscopic - Microalbumin / creatinine urine ratio - Magnesium - TSH  3. Hyperlipidemia, mixed  - EKG 12-Lead - [place], RETROPERITNL ABD,  LTD - Lipid panel - TSH  4. Abnormal glucose  - EKG 12-Lead - [place], RETROPERITNL ABD,  LTD - Hemoglobin A1c - Insulin, random  5. Vitamin D deficiency  -  VITAMIN D 25 Hydroxy  6. CLL (chronic lymphocytic leukemia) ([place])   7. Gastroesophageal reflux disease   8. Screening for colorectal cancer  - POC Hemoccult Bld/Stl (  9. BPH with obstruction/lower urinary tract symptoms  - PSA  10. Prostate cancer screening  - PSA  11. Screening for ischemic heart disease  - EKG 12-Lead  12. Screening for AAA (aortic abdominal aneurysm)  - [place], RETROPERITNL ABD,  LTD  13. FH: stroke  - EKG 12-Lead - [place], RETROPERITNL ABD,  LTD  14. Former smoker  - EKG 12-Lead - [place], RETROPERITNL ABD,  LTD  15. Medication management  - Urinalysis, Routine w reflex microscopic - Microalbumin / creatinine urine ratio - Magnesium - Lipid panel - TSH - Hemoglobin A1c - Insulin, random - VITAMIN D 25 Hydroxy          Patient was counseled in prudent diet, weight control to achieve/maintain BMI less than 25, BP monitoring, regular exercise and medications as discussed.  Discussed med effects and SE's. Routine screening labs and tests as requested with regular follow-up as recommended. Over 40 minutes of exam, counseling, chart review and high complex critical decision making was performed   Kirtland Bouchard, MD

## 2019-12-06 ENCOUNTER — Encounter: Payer: Self-pay | Admitting: Internal Medicine

## 2019-12-06 ENCOUNTER — Ambulatory Visit (INDEPENDENT_AMBULATORY_CARE_PROVIDER_SITE_OTHER): Payer: Medicare HMO | Admitting: Internal Medicine

## 2019-12-06 ENCOUNTER — Other Ambulatory Visit: Payer: Self-pay

## 2019-12-06 ENCOUNTER — Telehealth: Payer: Self-pay | Admitting: Hematology

## 2019-12-06 VITALS — BP 138/82 | HR 64 | Temp 97.1°F | Ht 71.0 in | Wt 192.8 lb

## 2019-12-06 DIAGNOSIS — Z79899 Other long term (current) drug therapy: Secondary | ICD-10-CM | POA: Diagnosis not present

## 2019-12-06 DIAGNOSIS — E559 Vitamin D deficiency, unspecified: Secondary | ICD-10-CM

## 2019-12-06 DIAGNOSIS — N138 Other obstructive and reflux uropathy: Secondary | ICD-10-CM | POA: Diagnosis not present

## 2019-12-06 DIAGNOSIS — Z1211 Encounter for screening for malignant neoplasm of colon: Secondary | ICD-10-CM

## 2019-12-06 DIAGNOSIS — C9110 Chronic lymphocytic leukemia of B-cell type not having achieved remission: Secondary | ICD-10-CM

## 2019-12-06 DIAGNOSIS — R7309 Other abnormal glucose: Secondary | ICD-10-CM

## 2019-12-06 DIAGNOSIS — R1013 Epigastric pain: Secondary | ICD-10-CM

## 2019-12-06 DIAGNOSIS — I10 Essential (primary) hypertension: Secondary | ICD-10-CM

## 2019-12-06 DIAGNOSIS — I700 Atherosclerosis of aorta: Secondary | ICD-10-CM

## 2019-12-06 DIAGNOSIS — Z823 Family history of stroke: Secondary | ICD-10-CM

## 2019-12-06 DIAGNOSIS — Z87891 Personal history of nicotine dependence: Secondary | ICD-10-CM

## 2019-12-06 DIAGNOSIS — Z0001 Encounter for general adult medical examination with abnormal findings: Secondary | ICD-10-CM

## 2019-12-06 DIAGNOSIS — E782 Mixed hyperlipidemia: Secondary | ICD-10-CM | POA: Diagnosis not present

## 2019-12-06 DIAGNOSIS — N401 Enlarged prostate with lower urinary tract symptoms: Secondary | ICD-10-CM | POA: Diagnosis not present

## 2019-12-06 DIAGNOSIS — Z125 Encounter for screening for malignant neoplasm of prostate: Secondary | ICD-10-CM | POA: Diagnosis not present

## 2019-12-06 DIAGNOSIS — C9111 Chronic lymphocytic leukemia of B-cell type in remission: Secondary | ICD-10-CM | POA: Diagnosis not present

## 2019-12-06 DIAGNOSIS — Z0000 Encounter for general adult medical examination without abnormal findings: Secondary | ICD-10-CM | POA: Diagnosis not present

## 2019-12-06 DIAGNOSIS — Z136 Encounter for screening for cardiovascular disorders: Secondary | ICD-10-CM

## 2019-12-06 DIAGNOSIS — K219 Gastro-esophageal reflux disease without esophagitis: Secondary | ICD-10-CM

## 2019-12-06 NOTE — Telephone Encounter (Signed)
Scheduled per 03/05 los, called patient and left a voicemail.

## 2019-12-07 LAB — URINALYSIS, ROUTINE W REFLEX MICROSCOPIC
Bilirubin Urine: NEGATIVE
Glucose, UA: NEGATIVE
Hgb urine dipstick: NEGATIVE
Ketones, ur: NEGATIVE
Leukocytes,Ua: NEGATIVE
Nitrite: NEGATIVE
Protein, ur: NEGATIVE
Specific Gravity, Urine: 1.022 (ref 1.001–1.03)
pH: 7 (ref 5.0–8.0)

## 2019-12-07 LAB — LIPID PANEL
Cholesterol: 168 mg/dL (ref <200)
HDL: 41 mg/dL (ref >=40)
LDL Cholesterol (Calc): 103 mg/dL (calc) — ABNORMAL HIGH
Non-HDL Cholesterol (Calc): 127 mg/dL (calc) (ref <130)
Total CHOL/HDL Ratio: 4.1 (calc) (ref <5.0)
Triglycerides: 139 mg/dL (ref <150)

## 2019-12-07 LAB — HEMOGLOBIN A1C
Hgb A1c MFr Bld: 5.4 % of total Hgb (ref <5.7)
Mean Plasma Glucose: 108 (calc)
eAG (mmol/L): 6 (calc)

## 2019-12-07 LAB — MICROALBUMIN / CREATININE URINE RATIO
Creatinine, Urine: 192 mg/dL (ref 20–320)
Microalb Creat Ratio: 2 mcg/mg creat (ref <30)
Microalb, Ur: 0.4 mg/dL

## 2019-12-07 LAB — PSA: PSA: 2.5 ng/mL (ref <=4.0)

## 2019-12-07 LAB — TSH: TSH: 1.11 mIU/L (ref 0.40–4.50)

## 2019-12-07 LAB — VITAMIN D 25 HYDROXY (VIT D DEFICIENCY, FRACTURES): Vit D, 25-Hydroxy: 50 ng/mL (ref 30–100)

## 2019-12-07 LAB — INSULIN, RANDOM: Insulin: 12.5 u[IU]/mL

## 2019-12-07 LAB — MAGNESIUM: Magnesium: 2.4 mg/dL (ref 1.5–2.5)

## 2019-12-10 ENCOUNTER — Encounter: Payer: Self-pay | Admitting: Internal Medicine

## 2019-12-15 ENCOUNTER — Ambulatory Visit
Admission: RE | Admit: 2019-12-15 | Discharge: 2019-12-15 | Disposition: A | Discharge status: Home / Self Care | Payer: Medicare HMO | Source: Ambulatory Visit | Attending: Internal Medicine | Admitting: Internal Medicine

## 2019-12-15 DIAGNOSIS — R1013 Epigastric pain: Secondary | ICD-10-CM | POA: Diagnosis not present

## 2020-01-04 ENCOUNTER — Encounter: Payer: Medicare HMO | Admitting: Internal Medicine

## 2020-01-04 MED FILL — VENCLEXTA 100 MG TABS: 100 | 30 days supply | Qty: 90 | Fill #1

## 2020-01-06 DIAGNOSIS — Z8619 Personal history of other infectious and parasitic diseases: Secondary | ICD-10-CM | POA: Diagnosis not present

## 2020-01-06 DIAGNOSIS — Z86010 Personal history of colonic polyps: Secondary | ICD-10-CM | POA: Diagnosis not present

## 2020-01-06 DIAGNOSIS — R112 Nausea with vomiting, unspecified: Secondary | ICD-10-CM | POA: Diagnosis not present

## 2020-01-06 DIAGNOSIS — R1013 Epigastric pain: Secondary | ICD-10-CM | POA: Diagnosis not present

## 2020-01-18 DIAGNOSIS — Z1152 Encounter for screening for COVID-19: Secondary | ICD-10-CM | POA: Diagnosis not present

## 2020-01-26 DIAGNOSIS — R634 Abnormal weight loss: Secondary | ICD-10-CM | POA: Diagnosis not present

## 2020-01-26 DIAGNOSIS — K31811 Angiodysplasia of stomach and duodenum with bleeding: Secondary | ICD-10-CM | POA: Diagnosis not present

## 2020-01-26 DIAGNOSIS — R1013 Epigastric pain: Secondary | ICD-10-CM | POA: Diagnosis not present

## 2020-01-26 DIAGNOSIS — R112 Nausea with vomiting, unspecified: Secondary | ICD-10-CM | POA: Diagnosis not present

## 2020-02-01 ENCOUNTER — Inpatient Hospital Stay (HOSPITAL_BASED_OUTPATIENT_CLINIC_OR_DEPARTMENT_OTHER): Payer: Medicare HMO | Admitting: Hematology

## 2020-02-01 ENCOUNTER — Other Ambulatory Visit: Payer: Self-pay | Admitting: Internal Medicine

## 2020-02-01 ENCOUNTER — Other Ambulatory Visit: Payer: Self-pay

## 2020-02-01 ENCOUNTER — Inpatient Hospital Stay: Payer: Medicare HMO | Attending: Hematology

## 2020-02-01 VITALS — BP 150/80 | HR 61 | Temp 98.3°F | Resp 20 | Ht 71.0 in | Wt 188.3 lb

## 2020-02-01 DIAGNOSIS — C9110 Chronic lymphocytic leukemia of B-cell type not having achieved remission: Secondary | ICD-10-CM

## 2020-02-01 DIAGNOSIS — R0602 Shortness of breath: Secondary | ICD-10-CM | POA: Diagnosis not present

## 2020-02-01 DIAGNOSIS — I10 Essential (primary) hypertension: Secondary | ICD-10-CM

## 2020-02-01 LAB — CMP (CANCER CENTER ONLY)
ALT: 10 U/L (ref 0–44)
AST: 11 U/L — ABNORMAL LOW (ref 15–41)
Albumin: 3.5 g/dL (ref 3.5–5.0)
Alkaline Phosphatase: 74 U/L (ref 38–126)
Anion gap: 6 (ref 5–15)
BUN: 13 mg/dL (ref 8–23)
CO2: 25 mmol/L (ref 22–32)
Calcium: 9.1 mg/dL (ref 8.9–10.3)
Chloride: 111 mmol/L (ref 98–111)
Creatinine: 1.13 mg/dL (ref 0.61–1.24)
GFR, Est AFR Am: >60 mL/min (ref >60)
GFR, Estimated: >60 mL/min (ref >60)
Glucose, Bld: 89 mg/dL (ref 70–99)
Potassium: 3.7 mmol/L (ref 3.5–5.1)
Sodium: 142 mmol/L (ref 135–145)
Total Bilirubin: 0.6 mg/dL (ref 0.3–1.2)
Total Protein: 6.3 g/dL — ABNORMAL LOW (ref 6.5–8.1)

## 2020-02-01 LAB — CBC WITH DIFFERENTIAL/PLATELET
Abs Immature Granulocytes: 0.01 10*3/uL (ref 0.00–0.07)
Basophils Absolute: 0 10*3/uL (ref 0.0–0.1)
Basophils Relative: 0 %
Eosinophils Absolute: 0 10*3/uL (ref 0.0–0.5)
Eosinophils Relative: 0 %
HCT: 39.5 % (ref 39.0–52.0)
Hemoglobin: 12.9 g/dL — ABNORMAL LOW (ref 13.0–17.0)
Immature Granulocytes: 0 %
Lymphocytes Relative: 36 %
Lymphs Abs: 1.3 10*3/uL (ref 0.7–4.0)
MCH: 30 pg (ref 26.0–34.0)
MCHC: 32.7 g/dL (ref 30.0–36.0)
MCV: 91.9 fL (ref 80.0–100.0)
Monocytes Absolute: 0.5 10*3/uL (ref 0.1–1.0)
Monocytes Relative: 14 %
Neutro Abs: 1.8 10*3/uL (ref 1.7–7.7)
Neutrophils Relative %: 50 %
Platelets: 158 10*3/uL (ref 150–400)
RBC: 4.3 MIL/uL (ref 4.22–5.81)
RDW: 13.2 % (ref 11.5–15.5)
WBC: 3.6 10*3/uL — ABNORMAL LOW (ref 4.0–10.5)
nRBC: 0 % (ref 0.0–0.2)

## 2020-02-01 LAB — LACTATE DEHYDROGENASE: LDH: 152 U/L (ref 98–192)

## 2020-02-01 NOTE — Progress Notes (Signed)
HEMATOLOGY/ONCOLOGY CLINIC NOTE  Date of Service: 02/01/20   Patient Care Team: Unk Pinto, MD as PCP - General (Internal Medicine) Inda Castle, MD (Inactive) as Consulting Physician (Gastroenterology) Melissa Noon, Curran as Referring Physician (Optometry) Danis, Kirke Corin, MD as Consulting Physician (Gastroenterology) Brunetta Genera, MD as Consulting Physician (Hematology)  CHIEF COMPLAINTS/PURPOSE OF CONSULTATION:  F/u for CD5 neg CLL   HISTORY OF PRESENTING ILLNESS:   Javier [name] is a wonderful 79 y.o. male who has been referred to [place] by Dr .Unk Pinto, MD for evaluation and management of lymphocytosis concerning for a lymphoproliferative process.  Patient has a h/o HTN, HLD, Prediabetes who notes that he had significant abdominal discomfort with significant dyspeptic symptoms around aug 2017 and had about 20-25lbs weight loss and had an extensive GI workup including CT abd/pelvis 05/25/2016 - WNL, EGD which showed chronic gastritis (helicobacter pylori +ve) and colonscopy with some polyps. Capsule endoscopy was not approved by insurance. Patients symptoms improved after treatment of H.pylori and he notes that he has gained back about 6-7 lbs and is eating better.  Patients labs were noted to show some leucocytosis/Lymphocytosis for which he was referred to [place] for further evaluation.  Patient notes some nightsweats and weight loss (as noted above, possible alternative explanation). No fevers/chills. Has not noted any overt enlarged LN and CT abd in 04/2016 showed no hepatomegaly or splenomegaly and no abd/RP LNadenopathy.  Patient notes no other acute focal symptoms.   INTERVAL HISTORY:  [name] is here for management and evaluation of his CLL. We are joined today by his wife. The patient's last visit with [place] was on 12/02/2019. The pt reports that he is doing well overall.  The pt reports that he feels very weak in the mornings. Pt did hold  Venetoclax for two weeks and restarted on 300 mg per day. His fatigue did not noticeably improve during the two weeks he held Venetoclax. He has SOB when he walks short distances. It is limiting is movement and activity at this time. His wife has noticed some issues with pt's memory. Pt is experiencing leg swelling that is not improved with elevation.   Pt had an EGD last Thursday due to excessive belching and epigastric pain. Pt had some clips taken and was told that he had minor GI bleeding, but is unsure of the source. He will return to see Dr. Earlean Shawl in two weeks.   Lab results today (02/01/20) of CBC w/diff and CMP is as follows: all values are WNL except for WBC at 3.6K, Hgb at 12.9, Total Protein at 6.3, AST at 11. 02/01/2020 LDH at 152  On review of systems, pt reports weakness, upper abdominal pain, excessive belching, low appetite, SOB, memory issues, leg swelling and denies black/bloody stools, constipation, diarrhea, chest pain and any other symptoms.   MEDICAL HISTORY:  Past Medical History:  Diagnosis Date  . Diverticulosis   . Hyperlipidemia   . Hypertension   . Prediabetes   . Vitamin D deficiency   Helicobacter pylori +ve   SURGICAL HISTORY: Past Surgical History:  Procedure Laterality Date  . NO PAST SURGERIES      SOCIAL HISTORY: Social History   Socioeconomic History  . Marital status: Married    Spouse name: Not on file  . Number of children: 5  . Years of education: Not on file  . Highest education level: Not on file  Occupational History  . Occupation: retired  Tobacco Use  .  Smoking status: Former Smoker    Types: Cigarettes    Quit date: 01/17/2003    Years since quitting: 17.0  . Smokeless tobacco: Never Used  Substance and Sexual Activity  . Alcohol use: Yes    Comment: very rare  . Drug use: No  . Sexual activity: Not on file  Other Topics Concern  . Not on file  Social History Narrative  . Not on file   Social Determinants of Health    Financial Resource Strain:   . Difficulty of Paying Living Expenses:   Food Insecurity:   . Worried About Charity fundraiser in the Last Year:   . Arboriculturist in the Last Year:   Transportation Needs:   . Film/video editor (Medical):   Marland Kitchen Lack of Transportation (Non-Medical):   Physical Activity:   . Days of Exercise per Week:   . Minutes of Exercise per Session:   Stress:   . Feeling of Stress :   Social Connections:   . Frequency of Communication with Friends and Family:   . Frequency of Social Gatherings with Friends and Family:   . Attends Religious Services:   . Active Member of Clubs or Organizations:   . Attends Archivist Meetings:   Marland Kitchen Marital Status:   Intimate Partner Violence:   . Fear of Current or Ex-Partner:   . Emotionally Abused:   Marland Kitchen Physically Abused:   . Sexually Abused:   Quit smoking >10 yrs ago, previously 1.5Packs per week. Started smoking in his teens. Retired. Worked as a Chief Financial Officer.   FAMILY HISTORY: Family History  Problem Relation Age of Onset  . Stroke Mother   . Early death Father        Farm/tractor accident  . Diabetes Brother   . Cirrhosis Brother   . Colon cancer Paternal Grandfather     ALLERGIES:  is allergic to penicillins; ppd [tuberculin purified protein derivative]; and tuberculin.  MEDICATIONS:  Current Outpatient Medications  Medication Sig Dispense Refill  . Cholecalciferol (VITAMIN D3) 5000 units TABS Take 5,000 Units by mouth daily.    Marland Kitchen losartan-hydrochlorothiazide (HYZAAR) 100-25 MG tablet Take 1 tablet Daily for BP & Fluid 90 tablet 1  . omeprazole (PRILOSEC) 40 MG capsule Take 1 capsule 2 x /day  for Indigestion & Heartburn 180 capsule 3  . pravastatin (PRAVACHOL) 40 MG tablet TAKE 1 TABLET AT BEDTIME  FOR  CHOLESTEROL 90 tablet 3  . sucralfate (CARAFATE) 1 g tablet Take 1 tablet (1 g total) by mouth 4 (four) times daily -  with meals and at bedtime. 120 tablet 1  . venetoclax (VENCLEXTA) 100  MG TABS Take 300 mg by mouth daily. Take with food and water. 90 tablet 3  . ALPRAZolam (XANAX) 0.5 MG tablet TAKE 1/2 -1 TABLET BY MOUTH 2-3 TIMES A DAY ONLY IF NEEDED FOR ANXIETY ATTACK AND LIMIT TO 5 DAYS A WEEK TO AVOID ADDICTION (Patient not taking: Reported on 02/01/2020) 90 tablet 0  . Alum & Mag Hydroxide-Simeth (MYLANTA PO) Take by mouth as needed.    Marland Kitchen aspirin EC 81 MG tablet Take 81 mg by mouth daily.    Marland Kitchen triamcinolone (NASACORT) 55 MCG/ACT AERO nasal inhaler Place 2 sprays into the nose at bedtime. 1 Inhaler 3   No current facility-administered medications for this visit.    REVIEW OF SYSTEMS:   A 10+ POINT REVIEW OF SYSTEMS WAS OBTAINED including neurology, dermatology, psychiatry, cardiac, respiratory, lymph, extremities, GI, GU,  Musculoskeletal, constitutional, breasts, reproductive, HEENT.  All pertinent positives are noted in the HPI.  All others are negative.   PHYSICAL EXAMINATION: ECOG PERFORMANCE STATUS: 1 - Symptomatic but completely ambulatory  Vitals:   02/01/20 1052  BP: (!) 150/80  Pulse: 61  Resp: 20  Temp: 98.3 F (36.8 C)  SpO2: 99%   Filed Weights   02/01/20 1052  Weight: 188 lb 4.8 oz (85.4 kg)   .Body mass index is 26.26 kg/m.   Exam was given in a chair   GENERAL:alert, in no acute distress and comfortable SKIN: no acute rashes, no significant lesions EYES: conjunctiva are pink and non-injected, sclera anicteric OROPHARYNX: MMM, no exudates, no oropharyngeal erythema or ulceration NECK: supple, no JVD LYMPH:  no palpable lymphadenopathy in the cervical, axillary or inguinal regions LUNGS: clear to auscultation b/l with normal respiratory effort HEART: regular rate, systolic mumur ABDOMEN:  normoactive bowel sounds , non tender, not distended. No palpable hepatosplenomegaly.  Extremity: 1-2+ pedal edema b/l PSYCH: alert & oriented x 3 with fluent speech NEURO: no focal motor/sensory deficits  LABORATORY DATA:  I have reviewed the data as  listed  . CBC Latest Ref Rng & Units 02/01/2020 12/02/2019 10/04/2019  WBC 4.0 - 10.5 K/uL 3.6(L) 4.6 4.4  Hemoglobin 13.0 - 17.0 g/dL 12.9(L) 14.2 13.4  Hematocrit 39.0 - 52.0 % 39.5 43.6 41.4  Platelets 150 - 400 K/uL 158 169 169   . CBC    Component Value Date/Time   WBC 3.6 (L) 02/01/2020 1035   RBC 4.30 02/01/2020 1035   HGB 12.9 (L) 02/01/2020 1035   HGB 12.2 (L) 01/03/2019 1509   HGB 14.5 10/21/2016 1324   HCT 39.5 02/01/2020 1035   HCT 43.8 10/21/2016 1324   PLT 158 02/01/2020 1035   PLT 152 01/03/2019 1509   PLT 157 10/21/2016 1324   MCV 91.9 02/01/2020 1035   MCV 90.9 10/21/2016 1324   MCH 30.0 02/01/2020 1035   MCHC 32.7 02/01/2020 1035   RDW 13.2 02/01/2020 1035   RDW 13.3 10/21/2016 1324   LYMPHSABS 1.3 02/01/2020 1035   LYMPHSABS 17.6 (H) 10/21/2016 1324   MONOABS 0.5 02/01/2020 1035   MONOABS 0.6 10/21/2016 1324   EOSABS 0.0 02/01/2020 1035   EOSABS 0.0 10/21/2016 1324   BASOSABS 0.0 02/01/2020 1035   BASOSABS 0.1 10/21/2016 1324     . CMP Latest Ref Rng & Units 02/01/2020 12/02/2019 10/04/2019  Glucose 70 - 99 mg/dL 89 83 112(H)  BUN 8 - 23 mg/dL '13 14 15  ' Creatinine 0.61 - 1.24 mg/dL 1.13 1.31(H) 1.10  Sodium 135 - 145 mmol/L 142 144 143  Potassium 3.5 - 5.1 mmol/L 3.7 4.2 3.7  Chloride 98 - 111 mmol/L 111 109 108  CO2 22 - 32 mmol/L '25 27 26  ' Calcium 8.9 - 10.3 mg/dL 9.1 9.9 9.4  Total Protein 6.5 - 8.1 g/dL 6.3(L) 6.7 6.4(L)  Total Bilirubin 0.3 - 1.2 mg/dL 0.6 0.7 0.3  Alkaline Phos 38 - 126 U/L 74 85 88  AST 15 - 41 U/L 11(L) 14(L) 16  ALT 0 - 44 U/L '10 12 13   ' . Lab Results  Component Value Date   LDH 152 02/01/2020         RADIOGRAPHIC STUDIES: I have personally reviewed the radiological images as listed and agreed with the findings in the report. No results found.  ASSESSMENT & PLAN:   79 y.o. male with   1) Monoclonal CD5 neg B-cell lymphoproliferative disorder -  likely CD5 neg CLL. FISH panel showed p53 (17p13) deletion that  would be consistent with this diagnosis . It often represents also be more aggressive form of CLL Differential diagnosis includes - CD5 neg CLL vs splenic lymphoma vs Marginal Zone lymphoma vs LPL (lymphoplasmacytic lymphoma) LDH WNL suggests against a high grade process. Currently no anemia or thrombocytopenia noted. Now with drenching night sweats and fatigue, no fevers/chills.  10/31/16 PET/CT scan shows some small hypermetabolic nodes in the thorax including bilateral hilar paratracheal) esophageal. These would be difficult to biopsy.  Lost to follow up between 11/06/16 and 11/19/18  12/01/18 PET/CT revealed Stable scattered small hypermetabolic thoracic lymph nodes, once again at [place] 5 activity level. 2. No findings of involvement in the neck, abdomen/pelvis, or skeleton. 3. Prostatomegaly. 4. Aortic Atherosclerosis and Emphysema 5. Sigmoid colon diverticulosis  PLAN: -Discussed pt labwork today, 02/01/20; WBC is stable, PLT are nml, Hgb is slightly lower, blood chemistries are stable, LDH is WNL -Discussed 12/06/2019 Vitamin D 25 (OH) is "Optimal" -Slight drop in Hgb could be due to GI bleeding discovered on EGD -Advised pt that due to lack of improvement in fatigue after discontinuing Venetoclax it is not likely the source of his exhaustion. -Pt complaints of SOB and leg swelling that is not improved with elevation is concerning for heart dysfunction  -The pt shows no overt clinical or lab progression of his CLL at this time.  -Recommend pt continue Venetoclax at 200 mg per day. The pt has no prohibitive toxicities from continuing at this time.  -Continue Cholcalciferol as prescribed -Recommend pt f/u with Dr. Earlean Shawl for GI bleeding  -Recommend pt f/u with Dr. Melford Aase for SOB evaluation  -Will get ECHO in 1 week -Will see back in 2 months with labs    2) . Patient Active Problem List   Diagnosis Date Noted  . Aortic atherosclerosis ([place]) 08/23/2019  . Memory changes 05/25/2019    . Counseling regarding advance care planning and goals of care 01/24/2019  . Anxiety 02/08/2018  . CLL (chronic lymphocytic leukemia) ([place]) 11/01/2016  . Encounter for Medicare annual wellness exam 09/11/2015  . GERD  02/09/2015  . Medication management 11/02/2013  . Hyperlipidemia   . Hypertension   . Abnormal glucose   . Vitamin D deficiency   . Diverticulosis    -f/u with PCP for management of other medical co-morbids  3) H.pylori gastritis -f/u with GI to ensure eradication of h.pylori post treatment.   FOLLOW UP: ECHO In 1 week F/u with PCP for evaluation of Shortness of breath RTC with Dr Irene Limbo with labs in 2 months   The total time spent in the appt was 30 minutes and more than 50% was on counseling and direct patient cares.  All of the patient's questions were answered with apparent satisfaction. The patient knows to call the clinic with any problems, questions or concerns.    Sullivan Lone MD [place] AAHIVMS [facility] Hematology/Oncology Physician [facility]  (Office):       [phone] (Work cell):  [phone] (Fax):           [phone]  02/01/2020 1:32 PM  I, Yevette Edwards, am acting as a scribe for Dr. Sullivan Lone.   .I have reviewed the above documentation for accuracy and completeness, and I agree with the above. Brunetta Genera MD

## 2020-02-07 ENCOUNTER — Other Ambulatory Visit: Payer: Self-pay | Admitting: Adult Health Nurse Practitioner

## 2020-02-07 ENCOUNTER — Other Ambulatory Visit: Payer: Self-pay

## 2020-02-07 ENCOUNTER — Ambulatory Visit (HOSPITAL_COMMUNITY)
Admission: RE | Admit: 2020-02-07 | Discharge: 2020-02-07 | Disposition: A | Discharge status: Home / Self Care | Payer: Medicare HMO | Source: Ambulatory Visit | Attending: Hematology | Admitting: Hematology

## 2020-02-07 DIAGNOSIS — R0602 Shortness of breath: Secondary | ICD-10-CM | POA: Diagnosis not present

## 2020-02-07 DIAGNOSIS — I10 Essential (primary) hypertension: Secondary | ICD-10-CM | POA: Diagnosis not present

## 2020-02-07 DIAGNOSIS — I059 Rheumatic mitral valve disease, unspecified: Secondary | ICD-10-CM | POA: Insufficient documentation

## 2020-02-07 DIAGNOSIS — F419 Anxiety disorder, unspecified: Secondary | ICD-10-CM

## 2020-02-07 DIAGNOSIS — R011 Cardiac murmur, unspecified: Secondary | ICD-10-CM | POA: Diagnosis not present

## 2020-02-07 DIAGNOSIS — C9110 Chronic lymphocytic leukemia of B-cell type not having achieved remission: Secondary | ICD-10-CM | POA: Insufficient documentation

## 2020-02-07 DIAGNOSIS — E785 Hyperlipidemia, unspecified: Secondary | ICD-10-CM | POA: Insufficient documentation

## 2020-02-07 MED ORDER — ALPRAZOLAM 0.5 MG PO TABS: ORAL_TABLET | ORAL | 0 refills | Status: DC

## 2020-02-07 NOTE — Progress Notes (Signed)
  Echocardiogram 2D Echocardiogram has been performed.  Jennette Dubin 02/07/2020, 11:21 AM

## 2020-02-13 ENCOUNTER — Encounter: Payer: Self-pay | Admitting: Gastroenterology

## 2020-02-28 ENCOUNTER — Other Ambulatory Visit: Payer: Self-pay | Admitting: Hematology

## 2020-02-28 DIAGNOSIS — C9110 Chronic lymphocytic leukemia of B-cell type not having achieved remission: Secondary | ICD-10-CM

## 2020-02-28 MED ORDER — VENCLEXTA 100 MG PO TABS: 200.0000 mg | ORAL_TABLET | Freq: Every day | ORAL | 3 refills | Status: DC

## 2020-03-06 DIAGNOSIS — K31819 Angiodysplasia of stomach and duodenum without bleeding: Secondary | ICD-10-CM | POA: Insufficient documentation

## 2020-03-06 DIAGNOSIS — Z86010 Personal history of colonic polyps: Secondary | ICD-10-CM | POA: Insufficient documentation

## 2020-03-06 DIAGNOSIS — Z6826 Body mass index (BMI) 26.0-26.9, adult: Secondary | ICD-10-CM | POA: Insufficient documentation

## 2020-03-06 NOTE — Progress Notes (Signed)
Assessment:   Encounter for Medicare annual wellness exam 1 YEAR  Essential hypertension Noncompliant with medications - wife will start monitoring Call in 2 weeks to check progress - declines to schedule recheck due to will be traveling  Monitor blood pressure at home; call if consistently over 130/80 Continue DASH diet.   Reminder to go to the ER if any CP, SOB, nausea, dizziness, severe HA, changes vision/speech, left arm numbness and tingling and jaw pain.  Gastroesophageal reflux disease with esophagitis Not taking meds at this time, we will send in prilosec 40mg  and he will take daily Discussed diet, avoiding triggers and other lifestyle changes  Diverticulosis Overdue for follow up with GI Bowel management discussed,   Vitamin D deficiency At goal at recent check; continue to recommend supplementation for goal of 70-100 Defer vitamin D level  Other abnormal glucose  Recent A1Cs at goal Discussed diet/exercise, weight management  Defer A1C; check CMP   Medication management CBC, CMP/GFR, magnesium when able  Mixed hyperlipidemia Off of pravastatin per Javier [name] due to CLL tx Continue low cholesterol diet and exercise.  Check lipid panel.   CLL (chronic lymphocytic leukemia) ([place]) managed by Javier [name] x 1 year; holding pravastatin in the interim  Gastric AVM  Follows up GI this week Emphasized adherence with recommended medications  History of colon polyps Overdue colonoscopy, has appointment with GI this week, will discuss  Anxiety Well managed by current regimen; continue medications  Stress management techniques discussed, increase water, good sleep hygiene discussed, increase exercise, and increase veggies.   Need for hep C screening Hep C screening ordered today   Memory changes Labs were normal; was referred to neuro but patient is declining further work up at this time Wife will start monitoring meds and BPs due to suspect med  non-compliant Monitor    Over 30 minutes of exam, counseling, chart review, and critical decision making was performed  Future Appointments  Date Time Provider [place]  04/06/2020 11:30 AM [facility] None  04/06/2020 12:00 PM Javier Genera, MD [facility] None  06/08/2020 11:00 AM Javier Pinto, MD [facility] None  12/13/2020 11:00 AM Javier Pinto, MD [facility] None     Plan:   During the course of the visit the patient was educated and counseled about appropriate screening and preventive services including:    Pneumococcal vaccine   Influenza vaccine  Prevnar 13  Td vaccine  Screening electrocardiogram  Colorectal cancer screening  Diabetes screening  Glaucoma screening  Nutrition counseling    Subjective:  Javier [name] is a 79 y.o. male who presents for Medicare Annual Wellness Visit and 3 month follow up for HTN, hyperlipidemia, glucose management, and vitamin D Def.   [name] is with him today.   He's also followed by Dr Irene [name] for smouldering Stage 0 CLL, taking Venetoclax Has some weakness/fatigue, edema, legs give out occasionally since starting med, have been advised to increase fluid and hold pravastatin while taking. Javier [name] ordered ECHO  He underwent EGD 01/26/2020 by Javier [name] found bleeding gastric AVM, has follow up appointment this Friday. On omeprazole 40 mg BID, carafate, mylanta but admits not taking regularly as prescribed, having lots of belching/burping. Seems food specific, intermittent but unsure what.   He reported progressive memory changes in 04/2019, "slow", had normal labs and was referred for further evaluation to Javier [name] but apparently patient didn't want to see another specialist at this time, wife feels memory changes are  mild, wants to defer for now.   he has a diagnosis of anxiety and is currently on xanax 0.25 mg QID PRN, reports symptoms are well controlled on current  regimen.  BMI is Body mass index is 26.36 kg/m., he has been working on diet, takes short walks ~5 min 4 times daily, also working in yard [employer] from Last 3 Encounters:  03/07/20 189 lb (85.7 kg)  02/01/20 188 lb 4.8 oz (85.4 kg)  12/06/19 192 lb 12.8 oz (87.5 kg)   His blood pressure has been controlled at home, today their BP is BP: (!) 168/98 Admits hasn't been taking BP medication, didn't take this AM. Wife didn't realize - states she will start monitoring BP and med compliance.  He does workout. He denies chest pain, shortness of breath, dizziness.   He is on cholesterol medication (off of pravastatin while taking venclexa per Javier [name]) and denies myalgias. His cholesterol is not at goal. The cholesterol last visit was:   Lab Results  Component Value Date   CHOL 168 12/06/2019   HDL 41 12/06/2019   LDLCALC 103 (H) 12/06/2019   TRIG 139 12/06/2019   CHOLHDL 4.1 12/06/2019   He has been working on diet and exercise for glucose management, and denies foot ulcerations, increased appetite, nausea, paresthesia of the feet, polydipsia, polyuria, visual disturbances, vomiting and weight loss. Last A1C in the office was:  Lab Results  Component Value Date   HGBA1C 5.4 12/06/2019   Last GFR Lab Results  Component Value Date   GFRAA >60 02/01/2020   Patient is on Vitamin D supplement.   Lab Results  Component Value Date   VD25OH 50 12/06/2019      Medication Review:   Current Outpatient Medications (Cardiovascular):  .  losartan-hydrochlorothiazide (HYZAAR) 100-25 MG tablet, Take 1 tablet Daily for BP & Fluid Retention / Ankle Swelling (Patient taking differently: as needed. Take 1 tablet Daily for BP & Fluid Retention / Ankle Swelling)  Current Outpatient Medications (Respiratory):  .  triamcinolone (NASACORT) 55 MCG/ACT AERO nasal inhaler, Place 2 sprays into the nose at bedtime.  Current Outpatient Medications (Analgesics):  .  aspirin EC 81 MG tablet, Take 81 mg by  mouth daily.   Current Outpatient Medications (Other):  Marland Kitchen  ALPRAZolam (XANAX) 0.5 MG tablet, TAKE 1/2 -1 TABLET BY MOUTH 2-3 TIMES A DAY ONLY IF NEEDED FOR ANXIETY ATTACK AND LIMIT TO 5 DAYS A WEEK TO AVOID ADDICTION .  Alum & Mag Hydroxide-Simeth (MYLANTA PO), Take by mouth as needed. .  Cholecalciferol (VITAMIN D3) 5000 units TABS, Take 5,000 Units by mouth daily. .  sucralfate (CARAFATE) 1 g tablet, Take 1 tablet (1 g total) by mouth 4 (four) times daily -  with meals and at bedtime. Marland Kitchen  venetoclax (VENCLEXTA) 100 MG TABS, Take 200 mg by mouth daily. Take with food and water. Marland Kitchen  omeprazole (PRILOSEC) 40 MG capsule, Take 1 capsule 2 x /day  for Indigestion & Heartburn (Patient not taking: Reported on 03/07/2020)  Allergies: Allergies  Allergen Reactions  . Penicillins Other (See Comments), Shortness Of Breath and Swelling    Unknown allergic reaction as a teenager Unknown allergic reaction as a teenager Other reaction(s): Other (See Comments) Unknown allergic reaction as a teenager Has patient had a PCN reaction causing immediate rash, facial/tongue/throat swelling, SOB or lightheadedness with hypotension: unknown Has patient had a PCN reaction causing severe rash involving mucus membranes or skin necrosis: unknown Has patient had a PCN reaction that  required hospitalization unknown Has patient had a PCN reaction occurring within the last 10 years: unknown If all of the above answers are "NO", th  . Ppd [Tuberculin Purified Protein Derivative]     Positive test in 2004  . Tuberculin Other (See Comments)    Positive test in 2004    Current Problems (verified) has Hyperlipidemia; Hypertension; Abnormal glucose; Vitamin D deficiency; Diverticulosis; Medication management; GERD ; Encounter for Medicare annual wellness exam; CLL (chronic lymphocytic leukemia) ([place]); Anxiety; Counseling regarding advance care planning and goals of care; Memory changes; Aortic atherosclerosis ([place]); BMI  26.0-26.9,adult; Gastric AVM; and History of colon polyps on their problem list.  Screening Tests Immunization History  Administered Date(s) Administered  . Influenza Split 07/28/2013  . Influenza, High Dose Seasonal PF 09/11/2015, 06/09/2016, 09/15/2018  . Pneumococcal Conjugate-13 11/05/2017  . Pneumococcal-Unspecified 07/01/1999, 09/20/2009  . Td 09/20/2009  . Zoster 11/08/2009   Preventative care: Last colonoscopy: 2015, Dr. Deatra Ina, polyps, 3 year follow up  was dismissed from [facility], now established with Javier [name], will discuss this Friday  EGD 2018, 12/2019, Javier [name], bleeding gastric AVM PET 11/2018  Prior vaccinations: TD or Tdap: 2010 will boost PRN Influenza: 2019 Pneumococcal: 2010 Prevnar13: 2019 Shingles/Zostavax: 2011 Covid 19: 2/2, 2021, pfizer   Names of Other Physician/Practitioners you currently use: 1. [facility] here for primary care 2. Dr. Delman Cheadle, eye doctor, last visit 2021, glasses 3. Dr. Wynetta Emery, dentist, last visit 2021, q6-63m  Patient Care Team: Javier Pinto, MD as PCP - General (Internal Medicine) Inda Castle, MD (Inactive) as Consulting Physician (Gastroenterology) Melissa Noon, [place] as Referring Physician (Optometry) Danis, Kirke Corin, MD as Consulting Physician (Gastroenterology) Javier Genera, MD as Consulting Physician (Hematology)  Surgical: He  has a past surgical history that includes No past surgeries. Family His family history includes Cirrhosis in his brother; Colon cancer in his paternal grandfather; Diabetes in his brother; Early death in his father; Stroke in his mother. Social history  He reports that he quit smoking about 17 years ago. His smoking use included cigarettes. He has never used smokeless tobacco. He reports current alcohol use. He reports that he does not use drugs.  MEDICARE WELLNESS OBJECTIVES: Physical activity: Current Exercise Habits: Home exercise routine,  Type of exercise: walking, Time (Minutes): 15, Frequency (Times/Week): 7, Weekly Exercise (Minutes/Week): 105, Intensity: Mild, Exercise limited by: Other - see comments  He will mow the lawn, weed eat.   Cardiac risk factors: Cardiac Risk Factors include: advanced age (>76men, >21 women);dyslipidemia;hypertension;male gender;smoking/ tobacco exposure Depression/mood screen:   Depression screen [facility] 2/9 12/05/2019  Decreased Interest 0  Down, Depressed, Hopeless 0  PHQ - 2 Score 0    ADLs:  In your present state of health, do you have any difficulty performing the following activities: 03/07/2020 12/05/2019  Hearing? Y N  Comment declines hearing aids due to cost -  Vision? N N  Difficulty concentrating or making decisions? Y N  Walking or climbing stairs? N N  Dressing or bathing? N N  Doing errands, shopping? N N  Comment Wife drives -  Some recent data might be hidden     Cognitive Testing  Alert? Yes  Normal Appearance?Yes  Oriented to person? Yes  Place? Yes   Time? Yes  Recall of three objects?  2/3  Can perform simple calculations? Yes  Displays appropriate judgment?Yes  Can read the correct time from a watch face?Yes  EOL planning: Does Patient Have a Medical  Advance Directive?: Yes Type of Advance Directive: Healthcare Power of Attorney, Living will Does patient want to make changes to medical advance directive?: No - Patient declined Copy of [place] in Chart?: No - copy requested   Objective:   Today's Vitals   03/07/20 1141  BP: (!) 168/98  Pulse: 64  Temp: 97.7 F (36.5 C)  SpO2: 97%  Weight: 189 lb (85.7 kg)  Height: 5\' 11"  (1.803 m)   Body mass index is 26.36 kg/m.  General Appearance: Well nourished, in no apparent distress. Eyes: PERRLA, conjunctiva no swelling or erythema ENT/Mouth:  No erythema, swelling, or exudate on post pharynx.  Tonsils not swollen or erythematous. Hearing normal.  Neck: Supple, thyroid normal.   Respiratory: Respiratory effort normal, BS equal bilaterally without rales, rhonchi, wheezing or stridor.  Cardio: RRR with no MRGs. Brisk peripheral pulses, bil pitting edema in ankles Abdomen: Soft, + BS.  No tenderness, no guarding, rebound, hernias, palpable masses. Lymphatics: Non tender without lymphadenopathy.  Musculoskeletal: No obvious deformity, Symmetrical strength, normal gait.  Skin: Warm, dry without rashes, lesions, ecchymosis.  Neuro: Normal muscle tone, slowed cognition Psych: Awake and oriented X 3, normal affect, Insight and Judgment appropriate.   Medicare Attestation I have personally reviewed: The patient's medical and social history Their use of alcohol, tobacco or illicit drugs Their current medications and supplements The patient's functional ability including ADLs,fall risks, home safety risks, cognitive, and hearing and visual impairment Diet and physical activities Evidence for depression or mood disorders  The patient's weight, height, BMI, and visual acuity have been recorded in the chart.  I have made referrals, counseling, and provided education to the patient based on review of the above and I have provided the patient with a written personalized care plan for preventive services.     Izora Ribas, NP   03/07/2020

## 2020-03-07 ENCOUNTER — Ambulatory Visit (INDEPENDENT_AMBULATORY_CARE_PROVIDER_SITE_OTHER): Payer: Medicare HMO | Admitting: Adult Health

## 2020-03-07 ENCOUNTER — Encounter: Payer: Self-pay | Admitting: Adult Health

## 2020-03-07 ENCOUNTER — Other Ambulatory Visit: Payer: Self-pay

## 2020-03-07 VITALS — BP 168/98 | HR 64 | Temp 97.7°F | Ht 71.0 in | Wt 189.0 lb

## 2020-03-07 DIAGNOSIS — E559 Vitamin D deficiency, unspecified: Secondary | ICD-10-CM

## 2020-03-07 DIAGNOSIS — Z79899 Other long term (current) drug therapy: Secondary | ICD-10-CM

## 2020-03-07 DIAGNOSIS — R419 Unspecified symptoms and signs involving cognitive functions and awareness: Secondary | ICD-10-CM

## 2020-03-07 DIAGNOSIS — I10 Essential (primary) hypertension: Secondary | ICD-10-CM

## 2020-03-07 DIAGNOSIS — Z6826 Body mass index (BMI) 26.0-26.9, adult: Secondary | ICD-10-CM

## 2020-03-07 DIAGNOSIS — I700 Atherosclerosis of aorta: Secondary | ICD-10-CM

## 2020-03-07 DIAGNOSIS — Z0001 Encounter for general adult medical examination with abnormal findings: Secondary | ICD-10-CM | POA: Diagnosis not present

## 2020-03-07 DIAGNOSIS — E782 Mixed hyperlipidemia: Secondary | ICD-10-CM | POA: Diagnosis not present

## 2020-03-07 DIAGNOSIS — Z860100 Personal history of colon polyps, unspecified: Secondary | ICD-10-CM

## 2020-03-07 DIAGNOSIS — R413 Other amnesia: Secondary | ICD-10-CM

## 2020-03-07 DIAGNOSIS — K5790 Diverticulosis of intestine, part unspecified, without perforation or abscess without bleeding: Secondary | ICD-10-CM

## 2020-03-07 DIAGNOSIS — K31819 Angiodysplasia of stomach and duodenum without bleeding: Secondary | ICD-10-CM

## 2020-03-07 DIAGNOSIS — R6889 Other general symptoms and signs: Secondary | ICD-10-CM

## 2020-03-07 DIAGNOSIS — C9110 Chronic lymphocytic leukemia of B-cell type not having achieved remission: Secondary | ICD-10-CM

## 2020-03-07 DIAGNOSIS — R7309 Other abnormal glucose: Secondary | ICD-10-CM | POA: Diagnosis not present

## 2020-03-07 DIAGNOSIS — Z0000 Encounter for general adult medical examination without abnormal findings: Secondary | ICD-10-CM

## 2020-03-07 DIAGNOSIS — Z1159 Encounter for screening for other viral diseases: Secondary | ICD-10-CM | POA: Diagnosis not present

## 2020-03-07 DIAGNOSIS — K2100 Gastro-esophageal reflux disease with esophagitis, without bleeding: Secondary | ICD-10-CM | POA: Diagnosis not present

## 2020-03-07 DIAGNOSIS — F419 Anxiety disorder, unspecified: Secondary | ICD-10-CM | POA: Diagnosis not present

## 2020-03-07 DIAGNOSIS — Z9114 Patient's other noncompliance with medication regimen: Secondary | ICD-10-CM

## 2020-03-07 NOTE — Patient Instructions (Addendum)
Javier [name] , Thank you for taking time to come for your Medicare Wellness Visit. I appreciate your ongoing commitment to your health goals. Please review the following plan we discussed and let me know if I can assist you in the future.   These are the goals we discussed: Goals    . Blood Pressure < 140/90       This is a list of the screening recommended for you and due dates:  Health Maintenance  Topic Date Due  .  Hepatitis C: One time screening is recommended by Center for Disease Control  (CDC) for  adults born from [birth year] through 1965.   Never done  . COVID-19 Vaccine (1) Never done  . Colon Cancer Screening  05/22/2017  . Tetanus Vaccine  03/07/2021*  . Flu Shot  04/29/2020  . Pneumonia vaccines  Completed  *Topic was postponed. The date shown is not the original due date.    Keep a food log to see what's causing stomach symptoms  Use pill box to track medications, consistently   Call in 10-14 days to let me know how blood pressures are running   Compression socks/hose daily in morning, elevate feet when sitting      HYPERTENSION INFORMATION  Monitor your blood pressure at home, please keep a record and bring that in with you to your next office visit.   Go to the ER if any CP, SOB, nausea, dizziness, severe HA, changes vision/speech  Testing/Procedures: HOW TO TAKE YOUR BLOOD PRESSURE:  Rest 5 minutes before taking your blood pressure.  Don't smoke or drink caffeinated beverages for at least 30 minutes before.  Take your blood pressure before (not after) you eat.  Sit comfortably with your back supported and both feet on the floor (don't cross your legs).  Elevate your arm to heart level on a table or a desk.  Use the proper sized cuff. It should fit smoothly and snugly around your bare upper arm. There should be enough room to slip a fingertip under the cuff. The bottom edge of the cuff should be 1 inch above the crease of the elbow.  Your most recent  BP: BP: (!) 168/98   Take your medications faithfully as instructed. Maintain a healthy weight. Get at least 150 minutes of aerobic exercise per week. Minimize salt intake. Minimize alcohol intake  DASH Eating Plan DASH stands for "Dietary Approaches to Stop Hypertension." The DASH eating plan is a healthy eating plan that has been shown to reduce high blood pressure (hypertension). Additional health benefits may include reducing the risk of type 2 diabetes mellitus, heart disease, and stroke. The DASH eating plan may also help with weight loss. WHAT DO I NEED TO KNOW ABOUT THE DASH EATING PLAN? For the DASH eating plan, you will follow these general guidelines:  Choose foods with a percent daily value for sodium of less than 5% (as listed on the food label).  Use salt-free seasonings or herbs instead of table salt or sea salt.  Check with your health care provider or pharmacist before using salt substitutes.  Eat lower-sodium products, often labeled as "lower sodium" or "no salt added."  Eat fresh foods.  Eat more vegetables, fruits, and low-fat dairy products.  Choose whole grains. Look for the word "whole" as the first word in the ingredient list.  Choose fish and skinless chicken or [place] more often than red meat. Limit fish, poultry, and meat to 6 oz (170 g) each day.  Limit sweets, desserts, sugars, and sugary drinks.  Choose heart-healthy fats.  Limit cheese to 1 oz (28 g) per day.  Eat more home-cooked food and less restaurant, buffet, and fast food.  Limit fried foods.  Cook foods using methods other than frying.  Limit canned vegetables. If you do use them, rinse them well to decrease the sodium.  When eating at a restaurant, ask that your food be prepared with less salt, or no salt if possible. WHAT FOODS CAN I EAT? Seek help from a dietitian for individual calorie needs. Grains Whole grain or whole wheat bread. Brown rice. Whole grain or whole wheat pasta.  Quinoa, bulgur, and whole grain cereals. Low-sodium cereals. Corn or whole wheat flour tortillas. Whole grain cornbread. Whole grain crackers. Low-sodium crackers. Vegetables Fresh or frozen vegetables (raw, steamed, roasted, or grilled). Low-sodium or reduced-sodium tomato and vegetable juices. Low-sodium or reduced-sodium tomato sauce and paste. Low-sodium or reduced-sodium canned vegetables.  Fruits All fresh, canned (in natural juice), or frozen fruits. Meat and Other Protein Products Ground beef (85% or leaner), grass-fed beef, or beef trimmed of fat. Skinless chicken or [place]. Ground chicken or [place]. Pork trimmed of fat. All fish and seafood. Eggs. Dried beans, peas, or lentils. Unsalted nuts and seeds. Unsalted canned beans. Dairy Low-fat dairy products, such as skim or 1% milk, 2% or reduced-fat cheeses, low-fat ricotta or cottage cheese, or plain low-fat yogurt. Low-sodium or reduced-sodium cheeses. Fats and Oils Tub margarines without trans fats. Light or reduced-fat mayonnaise and salad dressings (reduced sodium). Avocado. Safflower, olive, or canola oils. Natural peanut or almond butter. Other Unsalted popcorn and pretzels. The items listed above may not be a complete list of recommended foods or beverages. Contact your dietitian for more options. WHAT FOODS ARE NOT RECOMMENDED? Grains White bread. White pasta. White rice. Refined cornbread. Bagels and croissants. Crackers that contain trans fat. Vegetables Creamed or fried vegetables. Vegetables in a cheese sauce. Regular canned vegetables. Regular canned tomato sauce and paste. Regular tomato and vegetable juices. Fruits Dried fruits. Canned fruit in light or heavy syrup. Fruit juice. Meat and Other Protein Products Fatty cuts of meat. Ribs, chicken wings, bacon, sausage, bologna, salami, chitterlings, fatback, hot dogs, bratwurst, and packaged luncheon meats. Salted nuts and seeds. Canned beans with salt. Dairy Whole or 2%  milk, cream, half-and-half, and cream cheese. Whole-fat or sweetened yogurt. Full-fat cheeses or blue cheese. Nondairy creamers and whipped toppings. Processed cheese, cheese spreads, or cheese curds. Condiments Onion and garlic salt, seasoned salt, table salt, and sea salt. Canned and packaged gravies. Worcestershire sauce. Tartar sauce. Barbecue sauce. Teriyaki sauce. Soy sauce, including reduced sodium. Steak sauce. Fish sauce. Oyster sauce. Cocktail sauce. Horseradish. Ketchup and mustard. Meat flavorings and tenderizers. Bouillon cubes. Hot sauce. Tabasco sauce. Marinades. Taco seasonings. Relishes. Fats and Oils Butter, stick margarine, lard, shortening, ghee, and bacon fat. Coconut, palm kernel, or palm oils. Regular salad dressings. Other Pickles and olives. Salted popcorn and pretzels. The items listed above may not be a complete list of foods and beverages to avoid. Contact your dietitian for more information. WHERE CAN I FIND MORE INFORMATION? National Heart, Lung, and Blood Institute: travelstabloid.com Document Released: 09/04/2011 Document Revised: 01/30/2014 Document Reviewed: 07/20/2013 [facility] Patient Information 2015 [place], [place]. This information is not intended to replace advice given to you by your health care provider. Make sure you discuss any questions you have with your health care provider.

## 2020-03-08 LAB — COMPLETE METABOLIC PANEL WITH GFR
AG Ratio: 2 (calc) (ref 1.0–2.5)
ALT: 10 U/L (ref 9–46)
AST: 11 U/L (ref 10–35)
Albumin: 4.1 g/dL (ref 3.6–5.1)
Alkaline phosphatase (APISO): 74 U/L (ref 35–144)
BUN: 13 mg/dL (ref 7–25)
CO2: 26 mmol/L (ref 20–32)
Calcium: 9.5 mg/dL (ref 8.6–10.3)
Chloride: 108 mmol/L (ref 98–110)
Creat: 1.14 mg/dL (ref 0.70–1.18)
GFR, Est African American: 70 mL/min/{1.73_m2} (ref >=60)
GFR, Est Non African American: 61 mL/min/{1.73_m2} (ref >=60)
Globulin: 2.1 g/dL (calc) (ref 1.9–3.7)
Glucose, Bld: 82 mg/dL (ref 65–99)
Potassium: 4.2 mmol/L (ref 3.5–5.3)
Sodium: 139 mmol/L (ref 135–146)
Total Bilirubin: 0.5 mg/dL (ref 0.2–1.2)
Total Protein: 6.2 g/dL (ref 6.1–8.1)

## 2020-03-08 LAB — CBC WITH DIFFERENTIAL/PLATELET
Absolute Monocytes: 488 cells/uL (ref 200–950)
Basophils Absolute: 11 cells/uL (ref 0–200)
Basophils Relative: 0.3 %
Eosinophils Absolute: 0 cells/uL — ABNORMAL LOW (ref 15–500)
Eosinophils Relative: 0 %
HCT: 41.1 % (ref 38.5–50.0)
Hemoglobin: 13.8 g/dL (ref 13.2–17.1)
Lymphs Abs: 1243 cells/uL (ref 850–3900)
MCH: 31 pg (ref 27.0–33.0)
MCHC: 33.6 g/dL (ref 32.0–36.0)
MCV: 92.4 fL (ref 80.0–100.0)
MPV: 11.3 fL (ref 7.5–12.5)
Monocytes Relative: 13.2 %
Neutro Abs: 1957 cells/uL (ref 1500–7800)
Neutrophils Relative %: 52.9 %
Platelets: 161 10*3/uL (ref 140–400)
RBC: 4.45 10*6/uL (ref 4.20–5.80)
RDW: 12.6 % (ref 11.0–15.0)
Total Lymphocyte: 33.6 %
WBC: 3.7 10*3/uL — ABNORMAL LOW (ref 3.8–10.8)

## 2020-03-08 LAB — LIPID PANEL
Cholesterol: 197 mg/dL (ref <200)
HDL: 45 mg/dL (ref >=40)
LDL Cholesterol (Calc): 127 mg/dL (calc) — ABNORMAL HIGH
Non-HDL Cholesterol (Calc): 152 mg/dL (calc) — ABNORMAL HIGH (ref <130)
Total CHOL/HDL Ratio: 4.4 (calc) (ref <5.0)
Triglycerides: 131 mg/dL (ref <150)

## 2020-03-08 LAB — HEPATITIS C ANTIBODY
Hepatitis C Ab: NONREACTIVE
SIGNAL TO CUT-OFF: 0.01 (ref <1.00)

## 2020-03-08 LAB — TSH: TSH: 1.02 mIU/L (ref 0.40–4.50)

## 2020-03-08 LAB — MAGNESIUM: Magnesium: 2.2 mg/dL (ref 1.5–2.5)

## 2020-03-09 DIAGNOSIS — R142 Eructation: Secondary | ICD-10-CM | POA: Diagnosis not present

## 2020-03-09 DIAGNOSIS — Z86010 Personal history of colonic polyps: Secondary | ICD-10-CM | POA: Diagnosis not present

## 2020-03-09 DIAGNOSIS — K31819 Angiodysplasia of stomach and duodenum without bleeding: Secondary | ICD-10-CM | POA: Diagnosis not present

## 2020-03-29 ENCOUNTER — Other Ambulatory Visit: Payer: Self-pay

## 2020-03-29 DIAGNOSIS — F419 Anxiety disorder, unspecified: Secondary | ICD-10-CM

## 2020-03-29 MED ORDER — ALPRAZOLAM 0.5 MG PO TABS: ORAL_TABLET | ORAL | 0 refills | Status: DC

## 2020-03-29 NOTE — Telephone Encounter (Signed)
Refill request for Xanax. In que for your review. 

## 2020-04-05 NOTE — Progress Notes (Signed)
HEMATOLOGY/ONCOLOGY CLINIC NOTE  Date of Service: 04/06/20   Patient Care Team: Unk Pinto, MD as PCP - General (Internal Medicine) Inda Castle, MD (Inactive) as Consulting Physician (Gastroenterology) Melissa Noon, [place] as Referring Physician (Optometry) Danis, Kirke Corin, MD as Consulting Physician (Gastroenterology) Brunetta Genera, MD as Consulting Physician (Hematology)  CHIEF COMPLAINTS/PURPOSE OF CONSULTATION:  F/u for CD5 neg CLL   HISTORY OF PRESENTING ILLNESS:   Javier [name] is a wonderful 79 y.o. male who has been referred to [place] by Dr .Unk Pinto, MD for evaluation and management of lymphocytosis concerning for a lymphoproliferative process.  Patient has a h/o HTN, HLD, Prediabetes who notes that he had significant abdominal discomfort with significant dyspeptic symptoms around aug 2017 and had about 20-25lbs weight loss and had an extensive GI workup including CT abd/pelvis 05/25/2016 - WNL, EGD which showed chronic gastritis (helicobacter pylori +ve) and colonscopy with some polyps. Capsule endoscopy was not approved by insurance. Patients symptoms improved after treatment of H.pylori and he notes that he has gained back about 6-7 lbs and is eating better.  Patients labs were noted to show some leucocytosis/Lymphocytosis for which he was referred to [place] for further evaluation.  Patient notes some nightsweats and weight loss (as noted above, possible alternative explanation). No fevers/chills. Has not noted any overt enlarged LN and CT abd in 04/2016 showed no hepatomegaly or splenomegaly and no abd/RP LNadenopathy.  Patient notes no other acute focal symptoms.   INTERVAL HISTORY:  Javier [name] is here for management and evaluation of his CLL. We are joined today by his wife. The patient's last visit with [place] was on 02/01/20. The pt reports that he is doing well overall.  The pt reports he is good. Pt has been breathing better than last time and  he has had some ankle swelling. He has not seen his Dr. Melford Aase since last meeting but has a scheduled appointment for September. When pt first wakes up he is fatigued but it gets better after moving around. He is tolerating Venetoclax well. Everything has been steady. He is sleeping well and eating well. Pt has been having a burning feeling in his stomach. He has gotten both doses of the COVID19 vaccines.   Of note since the patient's last visit, pt has had Echocardiogram ([id number]) completed on 02/07/20 with results revealing "1. Left ventricular ejection fraction, by estimation, is 65 to 70%. The left ventricle has normal function. The left ventricle has no regional wall motion abnormalities. There is moderate left ventricular hypertrophy. Left ventricular diastolic parameters are consistent with Grade I diastolic dysfunction (impaired relaxation). The average left ventricular global longitudinal strain is -21.5 %. The global longitudinal strain is normal. 2. Right ventricular systolic function is normal. The right ventricular size is normal. There is normal pulmonary artery systolic pressure. 3. The mitral valve is abnormal. Trivial mitral valve regurgitation. 4. The aortic valve is tricuspid. Aortic valve regurgitation is not visualized. 5. The inferior vena cava is normal in size with greater than 50% respiratory variability, suggesting right atrial pressure of 3 mmHg."  Lab results today (04/06/20) of CBC w/diff and CMP is as follows: all values are WNL except for WBC at 3.6K, RBC at 4.19, Hemoglobin at 12.7, HCT at 38.8, Total Protein at 6.3 04/06/20 of Vitamin B12 at 403 04/06/20 of Ferritin at 45: WNL 04/06/20 of LDH at 148: WNL 04/06/20 of Iron and TIBC s as follows: all values are WNL   On review  of systems, pt reports sleeping well, healthy appetite, pedal edema and denies black/blood in stools, SOB, fatigue, new lumps/bumps, fevers, chills, night sweats, abdominal pain, trouble passing  urine and any other symptoms.   MEDICAL HISTORY:  Past Medical History:  Diagnosis Date  . Diverticulosis   . Hyperlipidemia   . Hypertension   . Prediabetes   . Vitamin D deficiency   Helicobacter pylori +ve   SURGICAL HISTORY: Past Surgical History:  Procedure Laterality Date  . NO PAST SURGERIES      SOCIAL HISTORY: Social History   Socioeconomic History  . Marital status: Married    Spouse name: Not on file  . Number of children: 5  . Years of education: Not on file  . Highest education level: Not on file  Occupational History  . Occupation: retired  Tobacco Use  . Smoking status: Former Smoker    Types: Cigarettes    Quit date: 01/17/2003    Years since quitting: 17.2  . Smokeless tobacco: Never Used  Substance and Sexual Activity  . Alcohol use: Yes    Comment: very rare  . Drug use: No  . Sexual activity: Not on file  Other Topics Concern  . Not on file  Social History Narrative  . Not on file   Social Determinants of Health   Financial Resource Strain:   . Difficulty of Paying Living Expenses:   Food Insecurity:   . Worried About Charity fundraiser in the Last Year:   . Arboriculturist in the Last Year:   Transportation Needs:   . Film/video editor (Medical):   Marland Kitchen Lack of Transportation (Non-Medical):   Physical Activity:   . Days of Exercise per Week:   . Minutes of Exercise per Session:   Stress:   . Feeling of Stress :   Social Connections:   . Frequency of Communication with Friends and Family:   . Frequency of Social Gatherings with Friends and Family:   . Attends Religious Services:   . Active Member of Clubs or Organizations:   . Attends Archivist Meetings:   Marland Kitchen Marital Status:   Intimate Partner Violence:   . Fear of Current or Ex-Partner:   . Emotionally Abused:   Marland Kitchen Physically Abused:   . Sexually Abused:   Quit smoking >10 yrs ago, previously 1.5Packs per week. Started smoking in his teens. Retired. Worked as a  Chief Financial Officer.   FAMILY HISTORY: Family History  Problem Relation Age of Onset  . Stroke Mother   . Early death Father        Farm/tractor accident  . Diabetes Brother   . Cirrhosis Brother   . Colon cancer Paternal Grandfather     ALLERGIES:  is allergic to penicillins, ppd [tuberculin purified protein derivative], and tuberculin.  MEDICATIONS:  Current Outpatient Medications  Medication Sig Dispense Refill  . ALPRAZolam (XANAX) 0.5 MG tablet TAKE 1/2 -1 TABLET BY MOUTH 2-3 TIMES A DAY ONLY IF NEEDED FOR ANXIETY ATTACK AND LIMIT TO 5 DAYS A WEEK TO AVOID ADDICTION 90 tablet 0  . Alum & Mag Hydroxide-Simeth (MYLANTA PO) Take by mouth as needed.    Marland Kitchen aspirin EC 81 MG tablet Take 81 mg by mouth daily.    . Cholecalciferol (VITAMIN D3) 5000 units TABS Take 5,000 Units by mouth daily.    Marland Kitchen losartan-hydrochlorothiazide (HYZAAR) 100-25 MG tablet Take 1 tablet Daily for BP & Fluid Retention / Ankle Swelling (Patient taking differently: as needed.  Take 1 tablet Daily for BP & Fluid Retention / Ankle Swelling) 90 tablet 0  . omeprazole (PRILOSEC) 40 MG capsule Take 1 capsule 2 x /day  for Indigestion & Heartburn 180 capsule 3  . sucralfate (CARAFATE) 1 g tablet Take 1 tablet (1 g total) by mouth 4 (four) times daily -  with meals and at bedtime. 120 tablet 1  . venetoclax (VENCLEXTA) 100 MG TABS Take 200 mg by mouth daily. Take with food and water. 60 tablet 3  . triamcinolone (NASACORT) 55 MCG/ACT AERO nasal inhaler Place 2 sprays into the nose at bedtime. 1 Inhaler 3   No current facility-administered medications for this visit.    REVIEW OF SYSTEMS:   A 10+ POINT REVIEW OF SYSTEMS WAS OBTAINED including neurology, dermatology, psychiatry, cardiac, respiratory, lymph, extremities, GI, GU, Musculoskeletal, constitutional, breasts, reproductive, HEENT.  All pertinent positives are noted in the HPI.  All others are negative.   PHYSICAL EXAMINATION: ECOG PERFORMANCE STATUS: 1 - Symptomatic  but completely ambulatory  Vitals:   04/06/20 1132 04/06/20 1133  BP: (!) 150/88 (!) 146/80  Pulse: 60   Resp: 18   Temp: 97.9 F (36.6 C)   SpO2: 99%    Filed Weights   04/06/20 1132  Weight: 189 lb 1.6 oz (85.8 kg)   .Body mass index is 26.37 kg/m.   Exam was given in a chair   GENERAL:alert, in no acute distress and comfortable SKIN: no acute rashes, no significant lesions EYES: conjunctiva are pink and non-injected, sclera anicteric OROPHARYNX: MMM, no exudates, no oropharyngeal erythema or ulceration NECK: supple, no JVD LYMPH:  no palpable lymphadenopathy in the cervical, axillary or inguinal regions LUNGS: clear to auscultation b/l with normal respiratory effort HEART: regular rate & rhythm ABDOMEN:  normoactive bowel sounds , non tender, not distended. Extremity:  pedal edema PSYCH: alert & oriented x 3 with fluent speech NEURO: no focal motor/sensory deficits  LABORATORY DATA:  I have reviewed the data as listed  . CBC Latest Ref Rng & Units 04/06/2020 03/07/2020 02/01/2020  WBC 4.0 - 10.5 K/uL 3.6(L) 3.7(L) 3.6(L)  Hemoglobin 13.0 - 17.0 g/dL 12.7(L) 13.8 12.9(L)  Hematocrit 39 - 52 % 38.8(L) 41.1 39.5  Platelets 150 - 400 K/uL 164 161 158   . CBC    Component Value Date/Time   WBC 3.6 (L) 04/06/2020 1119   RBC 4.19 (L) 04/06/2020 1119   HGB 12.7 (L) 04/06/2020 1119   HGB 12.2 (L) 01/03/2019 1509   HGB 14.5 10/21/2016 1324   HCT 38.8 (L) 04/06/2020 1119   HCT 43.8 10/21/2016 1324   PLT 164 04/06/2020 1119   PLT 152 01/03/2019 1509   PLT 157 10/21/2016 1324   MCV 92.6 04/06/2020 1119   MCV 90.9 10/21/2016 1324   MCH 30.3 04/06/2020 1119   MCHC 32.7 04/06/2020 1119   RDW 13.5 04/06/2020 1119   RDW 13.3 10/21/2016 1324   LYMPHSABS 1.2 04/06/2020 1119   LYMPHSABS 17.6 (H) 10/21/2016 1324   MONOABS 0.6 04/06/2020 1119   MONOABS 0.6 10/21/2016 1324   EOSABS 0.0 04/06/2020 1119   EOSABS 0.0 10/21/2016 1324   BASOSABS 0.0 04/06/2020 1119   BASOSABS  0.1 10/21/2016 1324     . CMP Latest Ref Rng & Units 04/06/2020 03/07/2020 02/01/2020  Glucose 70 - 99 mg/dL 89 82 89  BUN 8 - 23 mg/dL _0 Creatinine 0.61 - 1.24 mg/dL 1.07 1.14 1.13  Sodium 135 - 145 mmol/L 140 139 142  Potassium 3.5 - 5.1 mmol/L 4.1 4.2 3.7  Chloride 98 - 111 mmol/L 110 108 111  CO2 22 - 32 mmol/L _0 Calcium 8.9 - 10.3 mg/dL 9.3 9.5 9.1  Total Protein 6.5 - 8.1 g/dL 6.3(L) 6.2 6.3(L)  Total Bilirubin 0.3 - 1.2 mg/dL 0.6 0.5 0.6  Alkaline Phos 38 - 126 U/L 72 - 74  AST 15 - 41 U/L 15 11 11(L)  ALT 0 - 44 U/L _1 . Lab Results  Component Value Date   LDH 148 04/06/2020          RADIOGRAPHIC STUDIES: I have personally reviewed the radiological images as listed and agreed with the findings in the report. No results found.  ASSESSMENT & PLAN:   79 y.o. 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 FISH panel showed p53 (17p13) deletion that would be consistent with this diagnosis . It often represents also be more aggressive form of CLL Differential diagnosis includes - CD5 neg CLL vs splenic lymphoma vs Marginal Zone lymphoma vs LPL (lymphoplasmacytic lymphoma) LDH WNL suggests against a high grade process. Currently no anemia or thrombocytopenia noted. Now with drenching night sweats and fatigue, no fevers/chills.  10/31/16 PET/CT scan shows some small hypermetabolic nodes in the thorax including bilateral hilar paratracheal) esophageal. These would be difficult to biopsy.  Lost to follow up between 11/06/16 and 11/19/18  12/01/18 PET/CT revealed Stable scattered small hypermetabolic thoracic lymph nodes, once again at [place] 5 activity level. 2. No findings of involvement in the neck, abdomen/pelvis, or skeleton. 3. Prostatomegaly. 4. Aortic Atherosclerosis and Emphysema 5. Sigmoid colon diverticulosis  PLAN: -Discussed pt labwork today, 04/06/20; of CBC w/diff and CMP is as follows: all  values are WNL except for WBC at 3.6K, RBC at 4.19, Hemoglobin at 12.7, HCT at 38.8, Total Protein at 6.3 -Discussed 04/06/20 of Vitamin B12 at 403 -Discussed 04/06/20 of Ferritin at 45: WNL -Discussed 04/06/20 of LDH at 148: WNL -Discussed 04/06/20 of Iron and TIBC s as follows: all values are WNL  -Discussed 02/07/20 of  ([id number]) -The pt shows no overt clinical or lab progression of his CLL at this time.  -Recommend pt continue Venetoclax at 200 mg per day. The pt has no prohibitive toxicities from continuing at this time.  -Advised pt that due to lack of improvement in fatigue after discontinuing Venetoclax it is not likely the source of his exhaustion. -Advised on possibility of holding Venetoclax if blood counts are normal and scans have no active disease    -Continue Cholcalciferol as prescribed -Recommend pt f/u with Dr. Melford Aase for SOB and pedal edema -Recommends watching salt intake and keeping feet elevated -Will get repeat PET/CT scan in 8 weeks  -Will see back in 10 weeks with labs     2) . Patient Active Problem List   Diagnosis Date Noted  . BMI 26.0-26.9,adult 03/06/2020  . Gastric AVM 03/06/2020  . History of colon polyps 03/06/2020  . Aortic atherosclerosis ([place]) 08/23/2019  . Memory changes 05/25/2019  . Counseling regarding advance care planning and goals of care 01/24/2019  . Anxiety 02/08/2018  . CLL (chronic lymphocytic leukemia) ([place]) 11/01/2016  . Encounter for Medicare annual wellness exam 09/11/2015  . GERD  02/09/2015  . Medication management 11/02/2013  . Hyperlipidemia   . Hypertension   . Abnormal glucose   . Vitamin D deficiency   . Diverticulosis    -f/u with PCP  for management of other medical co-morbids  3) H.pylori gastritis -f/u with GI to ensure eradication of h.pylori post treatment.   FOLLOW UP: PET/CT in 8 weeks RTC with Dr Irene Limbo with labs in 10 weeks   The total time spent in the appt was 20 minutes and more than 50% was on  counseling and direct patient cares.  All of the patient's questions were answered with apparent satisfaction. The patient knows to call the clinic with any problems, questions or concerns.  Sullivan Lone MD [place] AAHIVMS [facility] Hematology/Oncology Physician [facility]  (Office):       [phone] (Work cell):  [phone] (Fax):           [phone]  04/06/2020 1:07 PM  I, Dawayne Cirri am acting as a Education administrator for Dr. Sullivan Lone.   .I have reviewed the above documentation for accuracy and completeness, and I agree with the above. Brunetta Genera MD

## 2020-04-06 ENCOUNTER — Inpatient Hospital Stay (HOSPITAL_BASED_OUTPATIENT_CLINIC_OR_DEPARTMENT_OTHER): Payer: Medicare HMO | Admitting: Hematology

## 2020-04-06 ENCOUNTER — Other Ambulatory Visit: Payer: Self-pay

## 2020-04-06 ENCOUNTER — Inpatient Hospital Stay: Payer: Medicare HMO | Attending: Hematology

## 2020-04-06 VITALS — BP 146/80 | HR 60 | Temp 97.9°F | Resp 18 | Ht 71.0 in | Wt 189.1 lb

## 2020-04-06 DIAGNOSIS — Z800 Family history of malignant neoplasm of digestive organs: Secondary | ICD-10-CM | POA: Insufficient documentation

## 2020-04-06 DIAGNOSIS — Z79899 Other long term (current) drug therapy: Secondary | ICD-10-CM | POA: Insufficient documentation

## 2020-04-06 DIAGNOSIS — E785 Hyperlipidemia, unspecified: Secondary | ICD-10-CM | POA: Insufficient documentation

## 2020-04-06 DIAGNOSIS — E559 Vitamin D deficiency, unspecified: Secondary | ICD-10-CM | POA: Diagnosis not present

## 2020-04-06 DIAGNOSIS — I10 Essential (primary) hypertension: Secondary | ICD-10-CM | POA: Insufficient documentation

## 2020-04-06 DIAGNOSIS — C9110 Chronic lymphocytic leukemia of B-cell type not having achieved remission: Secondary | ICD-10-CM | POA: Diagnosis not present

## 2020-04-06 DIAGNOSIS — Z7982 Long term (current) use of aspirin: Secondary | ICD-10-CM | POA: Insufficient documentation

## 2020-04-06 DIAGNOSIS — Q9389 Other deletions from the autosomes: Secondary | ICD-10-CM

## 2020-04-06 DIAGNOSIS — Z87891 Personal history of nicotine dependence: Secondary | ICD-10-CM | POA: Diagnosis not present

## 2020-04-06 DIAGNOSIS — R0602 Shortness of breath: Secondary | ICD-10-CM

## 2020-04-06 LAB — CMP (CANCER CENTER ONLY)
ALT: 13 U/L (ref 0–44)
AST: 15 U/L (ref 15–41)
Albumin: 3.5 g/dL (ref 3.5–5.0)
Alkaline Phosphatase: 72 U/L (ref 38–126)
Anion gap: 6 (ref 5–15)
BUN: 12 mg/dL (ref 8–23)
CO2: 24 mmol/L (ref 22–32)
Calcium: 9.3 mg/dL (ref 8.9–10.3)
Chloride: 110 mmol/L (ref 98–111)
Creatinine: 1.07 mg/dL (ref 0.61–1.24)
GFR, Est AFR Am: >60 mL/min (ref >60)
GFR, Estimated: >60 mL/min (ref >60)
Glucose, Bld: 89 mg/dL (ref 70–99)
Potassium: 4.1 mmol/L (ref 3.5–5.1)
Sodium: 140 mmol/L (ref 135–145)
Total Bilirubin: 0.6 mg/dL (ref 0.3–1.2)
Total Protein: 6.3 g/dL — ABNORMAL LOW (ref 6.5–8.1)

## 2020-04-06 LAB — CBC WITH DIFFERENTIAL/PLATELET
Abs Immature Granulocytes: 0.01 10*3/uL (ref 0.00–0.07)
Basophils Absolute: 0 10*3/uL (ref 0.0–0.1)
Basophils Relative: 1 %
Eosinophils Absolute: 0 10*3/uL (ref 0.0–0.5)
Eosinophils Relative: 0 %
HCT: 38.8 % — ABNORMAL LOW (ref 39.0–52.0)
Hemoglobin: 12.7 g/dL — ABNORMAL LOW (ref 13.0–17.0)
Immature Granulocytes: 0 %
Lymphocytes Relative: 34 %
Lymphs Abs: 1.2 10*3/uL (ref 0.7–4.0)
MCH: 30.3 pg (ref 26.0–34.0)
MCHC: 32.7 g/dL (ref 30.0–36.0)
MCV: 92.6 fL (ref 80.0–100.0)
Monocytes Absolute: 0.6 10*3/uL (ref 0.1–1.0)
Monocytes Relative: 15 %
Neutro Abs: 1.8 10*3/uL (ref 1.7–7.7)
Neutrophils Relative %: 50 %
Platelets: 164 10*3/uL (ref 150–400)
RBC: 4.19 MIL/uL — ABNORMAL LOW (ref 4.22–5.81)
RDW: 13.5 % (ref 11.5–15.5)
WBC: 3.6 10*3/uL — ABNORMAL LOW (ref 4.0–10.5)
nRBC: 0 % (ref 0.0–0.2)

## 2020-04-06 LAB — IRON AND TIBC
Iron: 102 ug/dL (ref 42–163)
Saturation Ratios: 33 % (ref 20–55)
TIBC: 308 ug/dL (ref 202–409)
UIBC: 205 ug/dL (ref 117–376)

## 2020-04-06 LAB — VITAMIN B12: Vitamin B-12: 403 pg/mL (ref 180–914)

## 2020-04-06 LAB — FERRITIN: Ferritin: 45 ng/mL (ref 24–336)

## 2020-04-06 LAB — LACTATE DEHYDROGENASE: LDH: 148 U/L (ref 98–192)

## 2020-04-09 ENCOUNTER — Telehealth: Payer: Self-pay | Admitting: Hematology

## 2020-04-09 NOTE — Telephone Encounter (Signed)
Scheduled per 07/09 los, patient has been called and notified.

## 2020-04-16 MED FILL — VENCLEXTA 100 MG TABS: 100 | 30 days supply | Qty: 60 | Fill #0

## 2020-05-10 NOTE — Progress Notes (Signed)
Assessment and Plan:  Diagnoses and all orders for this visit:  Acute right-sided low back pain without sciatica - negative straight leg Prednisone was prescribed,NSAIDs, RICE, and exercise given If not better follow up in office or will refer to PT/orthopedics. Neurosurgeon distributed. Proper lifting, bending technique discussed. Stretching exercises discussed. Ice to affected area as needed for local pain relief. Heat to affected area as needed for local pain relief. OTC analgesics as needed.   -     DG Lumbar Spine Complete; Future -     predniSONE (DELTASONE) 20 MG tablet; 2 tablets daily for 3 days, 1 tablet daily for 4 days.  Further disposition pending results of labs. Discussed med's effects and SE's.   Over 15 minutes of exam, counseling, chart review, and critical decision making was performed.   Future Appointments  Date Time Provider [place]  05/11/2020  8:45 AM Javier Comber, NP [facility] None  06/08/2020 11:00 AM Javier Pinto, MD [facility] None  06/14/2020  9:00 AM [facility] 6 [facility] None  06/14/2020  9:40 AM Javier Genera, MD [facility] None  12/13/2020 11:00 AM Javier Pinto, MD [facility] None    ------------------------------------------------------------------------------------------------------------------   HPI BP 118/70   Pulse 61   Temp (!) 97.5 F (36.4 C)   Wt 185 lb (83.9 kg)   SpO2 97%   BMI 25.80 kg/m   79 y.o.male with CLL followed by Dr. Irene [name], hx of gastric AVM presents for back pain   Denies hx of back problems, reports 3 months of lower back pain. No injury or fall prior to onset. Reports on R side, initially was intermittent, now constant, sharp, frequent, brief, non-radiating. Reports currently around 6/10 in the last month. Reports pain is worst in the morning, eases up as the day goes by but doesn't go away. Pain worse with standing extended periods.   He has tried back patches OTC - some  improvement. He did try aleve with some improvement, but not consistently.   Denies fatigue, night sweats, unintended weight loss.    Past Medical History:  Diagnosis Date  . Diverticulosis   . Hyperlipidemia   . Hypertension   . Prediabetes   . Vitamin D deficiency      Allergies  Allergen Reactions  . Penicillins Other (See Comments), Shortness Of Breath and Swelling    Unknown allergic reaction as a teenager Unknown allergic reaction as a teenager Other reaction(s): Other (See Comments) Unknown allergic reaction as a teenager Has patient had a PCN reaction causing immediate rash, facial/tongue/throat swelling, SOB or lightheadedness with hypotension: unknown Has patient had a PCN reaction causing severe rash involving mucus membranes or skin necrosis: unknown Has patient had a PCN reaction that required hospitalization unknown Has patient had a PCN reaction occurring within the last 10 years: unknown If all of the above answers are "NO", th  . Ppd [Tuberculin Purified Protein Derivative]     Positive test in 2004  . Tuberculin Other (See Comments)    Positive test in 2004    Current Outpatient Medications on File Prior to Visit  Medication Sig  . ALPRAZolam (XANAX) 0.5 MG tablet TAKE 1/2 -1 TABLET BY MOUTH 2-3 TIMES A DAY ONLY IF NEEDED FOR ANXIETY ATTACK AND LIMIT TO 5 DAYS A WEEK TO AVOID ADDICTION  . Alum & Mag Hydroxide-Simeth (MYLANTA PO) Take by mouth as needed.  Marland Kitchen aspirin EC 81 MG tablet Take 81 mg by mouth daily.  . Cholecalciferol (VITAMIN D3) 5000 units TABS  Take 5,000 Units by mouth daily.  Marland Kitchen losartan-hydrochlorothiazide (HYZAAR) 100-25 MG tablet Take 1 tablet Daily for BP & Fluid Retention / Ankle Swelling (Patient taking differently: as needed. Take 1 tablet Daily for BP & Fluid Retention / Ankle Swelling)  . omeprazole (PRILOSEC) 40 MG capsule Take 1 capsule 2 x /day  for Indigestion & Heartburn  . sucralfate (CARAFATE) 1 g tablet Take 1 tablet (1 g total) by  mouth 4 (four) times daily -  with meals and at bedtime.  . triamcinolone (NASACORT) 55 MCG/ACT AERO nasal inhaler Place 2 sprays into the nose at bedtime.  Marland Kitchen venetoclax (VENCLEXTA) 100 MG TABS Take 200 mg by mouth daily. Take with food and water.   No current facility-administered medications on file prior to visit.    ROS: all negative except above.   Physical Exam:  There were no vitals taken for this visit.  General Appearance: Well nourished, in no apparent distress. Eyes: PERRLA, conjunctiva no swelling or erythema ENT/Mouth: mask in place; Hearing normal.  Neck: Supple Respiratory: Respiratory effort normal, BS equal bilaterally without rales, rhonchi, wheezing or stridor.  Cardio: RRR with no MRGs. Brisk peripheral pulses without edema.  Abdomen: Soft, + BS.  Non tender, no guarding, rebound, hernias, masses. Lymphatics: Non tender without lymphadenopathy.  Musculoskeletal: Patient is able to ambulate well. Gait is not  Antalgic. Straight leg raising with dorsiflexion negative bilaterally for radicular symptoms. Sensory exam in the legs are normal. Knee reflexes are normal Ankle reflexes are normal Strength is normal and symmetric in arms and legs. There is SI tenderness to palpation.  There is not paraspinal muscle spasm.  There is not midline tenderness.  ROM of spine with  limited in extension due to pain.  Skin: Warm, dry without rashes, lesions, ecchymosis.  Neuro:  Normal muscle tone, Sensation intact.  Psych: Awake and oriented X 3, normal affect, Insight and Judgment appropriate.     Javier Ribas, NP 12:51 PM [facility]

## 2020-05-11 ENCOUNTER — Ambulatory Visit (INDEPENDENT_AMBULATORY_CARE_PROVIDER_SITE_OTHER): Payer: Medicare HMO | Admitting: Adult Health

## 2020-05-11 ENCOUNTER — Other Ambulatory Visit: Payer: Self-pay

## 2020-05-11 ENCOUNTER — Encounter: Payer: Self-pay | Admitting: Adult Health

## 2020-05-11 VITALS — BP 118/70 | HR 61 | Temp 97.5°F | Wt 185.0 lb

## 2020-05-11 DIAGNOSIS — M545 Low back pain, unspecified: Secondary | ICD-10-CM

## 2020-05-11 MED ORDER — PREDNISONE 20 MG PO TABS
ORAL_TABLET | ORAL | 0 refills | Status: DC
Start: 2020-05-11 — End: 2020-06-07

## 2020-05-11 NOTE — Patient Instructions (Addendum)
Try heat, stretches, prednisone, can do tylenol if needed   Can also try topical voltaren (diclofenac gel)   Simethicone - gas x - over the counter for belching and bloating     Low Back Sprain or Strain Rehab Ask your health care provider which exercises are safe for you. Do exercises exactly as told by your health care provider and adjust them as directed. It is normal to feel mild stretching, pulling, tightness, or discomfort as you do these exercises. Stop right away if you feel sudden pain or your pain gets worse. Do not begin these exercises until told by your health care provider. Stretching and range-of-motion exercises These exercises warm up your muscles and joints and improve the movement and flexibility of your back. These exercises also help to relieve pain, numbness, and tingling. Lumbar rotation  1. Lie on your back on a firm surface and bend your knees. 2. Straighten your arms out to your sides so each arm forms a 90-degree angle (right angle) with a side of your body. 3. Slowly move (rotate) both of your knees to one side of your body until you feel a stretch in your lower back (lumbar). Try not to let your shoulders lift off the floor. 4. Hold this position for __________ seconds. 5. Tense your abdominal muscles and slowly move your knees back to the starting position. 6. Repeat this exercise on the other side of your body. Repeat __________ times. Complete this exercise __________ times a day. Single knee to chest  1. Lie on your back on a firm surface with both legs straight. 2. Bend one of your knees. Use your hands to move your knee up toward your chest until you feel a gentle stretch in your lower back and buttock. ? Hold your leg in this position by holding on to the front of your knee. ? Keep your other leg as straight as possible. 3. Hold this position for __________ seconds. 4. Slowly return to the starting position. 5. Repeat with your other  leg. Repeat __________ times. Complete this exercise __________ times a day. Prone extension on elbows  1. Lie on your abdomen on a firm surface (prone position). 2. Prop yourself up on your elbows. 3. Use your arms to help lift your chest up until you feel a gentle stretch in your abdomen and your lower back. ? This will place some of your body weight on your elbows. If this is uncomfortable, try stacking pillows under your chest. ? Your hips should stay down, against the surface that you are lying on. Keep your hip and back muscles relaxed. 4. Hold this position for __________ seconds. 5. Slowly relax your upper body and return to the starting position. Repeat __________ times. Complete this exercise __________ times a day. Strengthening exercises These exercises build strength and endurance in your back. Endurance is the ability to use your muscles for a long time, even after they get tired. Pelvic tilt This exercise strengthens the muscles that lie deep in the abdomen. 1. Lie on your back on a firm surface. Bend your knees and keep your feet flat on the floor. 2. Tense your abdominal muscles. Tip your pelvis up toward the ceiling and flatten your lower back into the floor. ? To help with this exercise, you may place a small towel under your lower back and try to push your back into the towel. 3. Hold this position for __________ seconds. 4. Let your muscles relax  completely before you repeat this exercise. Repeat __________ times. Complete this exercise __________ times a day. Alternating arm and leg raises  1. Get on your hands and knees on a firm surface. If you are on a hard floor, you may want to use padding, such as an exercise mat, to cushion your knees. 2. Line up your arms and legs. Your hands should be directly below your shoulders, and your knees should be directly below your hips. 3. Lift your left leg behind you. At the same time, raise your right arm and straighten it in  front of you. ? Do not lift your leg higher than your hip. ? Do not lift your arm higher than your shoulder. ? Keep your abdominal and back muscles tight. ? Keep your hips facing the ground. ? Do not arch your back. ? Keep your balance carefully, and do not hold your breath. 4. Hold this position for __________ seconds. 5. Slowly return to the starting position. 6. Repeat with your right leg and your left arm. Repeat __________ times. Complete this exercise __________ times a day. Abdominal set with straight leg raise  1. Lie on your back on a firm surface. 2. Bend one of your knees and keep your other leg straight. 3. Tense your abdominal muscles and lift your straight leg up, 4-6 inches (10-15 cm) off the ground. 4. Keep your abdominal muscles tight and hold this position for __________ seconds. ? Do not hold your breath. ? Do not arch your back. Keep it flat against the ground. 5. Keep your abdominal muscles tense as you slowly lower your leg back to the starting position. 6. Repeat with your other leg. Repeat __________ times. Complete this exercise __________ times a day. Single leg lower with bent knees 1. Lie on your back on a firm surface. 2. Tense your abdominal muscles and lift your feet off the floor, one foot at a time, so your knees and hips are bent in 90-degree angles (right angles). ? Your knees should be over your hips and your lower legs should be parallel to the floor. 3. Keeping your abdominal muscles tense and your knee bent, slowly lower one of your legs so your toe touches the ground. 4. Lift your leg back up to return to the starting position. ? Do not hold your breath. ? Do not let your back arch. Keep your back flat against the ground. 5. Repeat with your other leg. Repeat __________ times. Complete this exercise __________ times a day. Posture and body mechanics Good posture and healthy body mechanics can help to relieve stress in your body's tissues and  joints. Body mechanics refers to the movements and positions of your body while you do your daily activities. Posture is part of body mechanics. Good posture means:  Your spine is in its natural S-curve position (neutral).  Your shoulders are pulled back slightly.  Your head is not tipped forward. Follow these guidelines to improve your posture and body mechanics in your everyday activities. Standing   When standing, keep your spine neutral and your feet about hip width apart. Keep a slight bend in your knees. Your ears, shoulders, and hips should line up.  When you do a task in which you stand in one place for a long time, place one foot up on a stable object that is 2-4 inches (5-10 cm) high, such as a footstool. This helps keep your spine neutral. Sitting   When sitting, keep your spine neutral and keep  your feet flat on the floor. Use a footrest, if necessary, and keep your thighs parallel to the floor. Avoid rounding your shoulders, and avoid tilting your head forward.  When working at a desk or a computer, keep your desk at a height where your hands are slightly lower than your elbows. Slide your chair under your desk so you are close enough to maintain good posture.  When working at a computer, place your monitor at a height where you are looking straight ahead and you do not have to tilt your head forward or downward to look at the screen. Resting  When lying down and resting, avoid positions that are most painful for you.  If you have pain with activities such as sitting, bending, stooping, or squatting, lie in a position in which your body does not bend very much. For example, avoid curling up on your side with your arms and knees near your chest (fetal position).  If you have pain with activities such as standing for a long time or reaching with your arms, lie with your spine in a neutral position and bend your knees slightly. Try the following positions: ? Lying on your side  with a pillow between your knees. ? Lying on your back with a pillow under your knees. Lifting   When lifting objects, keep your feet at least shoulder width apart and tighten your abdominal muscles.  Bend your knees and hips and keep your spine neutral. It is important to lift using the strength of your legs, not your back. Do not lock your knees straight out.  Always ask for help to lift heavy or awkward objects. This information is not intended to replace advice given to you by your health care provider. Make sure you discuss any questions you have with your health care provider. Document Revised: 01/07/2019 Document Reviewed: 10/07/2018 Elsevier Patient Education  [place].      Abdominal Bloating When you have abdominal bloating, your abdomen may feel full, tight, or painful. It may also look bigger than normal or swollen (distended). Common causes of abdominal bloating include:  Swallowing air.  Constipation.  Problems digesting food.  Eating too much.  Irritable bowel syndrome. This is a condition that affects the large intestine.  Lactose intolerance. This is an inability to digest lactose, a natural sugar in dairy products.  Celiac disease. This is a condition that affects the ability to digest gluten, a protein found in some grains.  Gastroparesis. This is a condition that slows down the movement of food in the stomach and small intestine. It is more common in people with diabetes mellitus.  Gastroesophageal reflux disease (GERD). This is a digestive condition that makes stomach acid flow back into the esophagus.  Urinary retention. This means that the body is holding onto urine, and the bladder cannot be emptied all the way. Follow these instructions at home: Eating and drinking  Avoid eating too much.  Try not to swallow air while talking or eating.  Avoid eating while lying down.  Avoid these foods and drinks: ? Foods that cause gas, such as  broccoli, cabbage, cauliflower, and baked beans. ? Carbonated drinks. ? Hard candy. ? Chewing gum. Medicines  Take over-the-counter and prescription medicines only as told by your health care provider.  Take probiotic medicines. These medicines contain live bacteria or yeasts that can help digestion.  Take coated peppermint oil capsules. Activity  Try to exercise regularly. Exercise may help to relieve bloating that is  caused by gas and relieve constipation. General instructions  Keep all follow-up visits as told by your health care provider. This is important. Contact a health care provider if:  You have nausea and vomiting.  You have diarrhea.  You have abdominal pain.  You have unusual weight loss or weight gain.  You have severe pain, and medicines do not help. Get help right away if:  You have severe chest pain.  You have trouble breathing.  You have shortness of breath.  You have trouble urinating.  You have darker urine than normal.  You have blood in your stools or have dark, tarry stools. Summary  Abdominal bloating means that the abdomen is swollen.  Common causes of abdominal bloating are swallowing air, constipation, and problems digesting food.  Avoid eating too much and avoid swallowing air.  Avoid foods that cause gas, carbonated drinks, hard candy, and chewing gum. This information is not intended to replace advice given to you by your health care provider. Make sure you discuss any questions you have with your health care provider. Document Revised: 01/03/2019 Document Reviewed: 10/17/2016 Elsevier Patient Education  [place].

## 2020-05-14 ENCOUNTER — Other Ambulatory Visit: Payer: Self-pay | Admitting: Internal Medicine

## 2020-05-14 DIAGNOSIS — I10 Essential (primary) hypertension: Secondary | ICD-10-CM

## 2020-06-01 ENCOUNTER — Other Ambulatory Visit: Payer: Self-pay

## 2020-06-01 ENCOUNTER — Ambulatory Visit (HOSPITAL_COMMUNITY)
Admission: RE | Admit: 2020-06-01 | Discharge: 2020-06-01 | Disposition: A | Discharge status: Home / Self Care | Payer: Medicare HMO | Source: Ambulatory Visit | Attending: Hematology | Admitting: Hematology

## 2020-06-01 DIAGNOSIS — K5730 Diverticulosis of large intestine without perforation or abscess without bleeding: Secondary | ICD-10-CM | POA: Insufficient documentation

## 2020-06-01 DIAGNOSIS — C9110 Chronic lymphocytic leukemia of B-cell type not having achieved remission: Secondary | ICD-10-CM | POA: Diagnosis not present

## 2020-06-01 DIAGNOSIS — N400 Enlarged prostate without lower urinary tract symptoms: Secondary | ICD-10-CM | POA: Diagnosis not present

## 2020-06-01 DIAGNOSIS — Q9389 Other deletions from the autosomes: Secondary | ICD-10-CM

## 2020-06-01 DIAGNOSIS — I2510 Atherosclerotic heart disease of native coronary artery without angina pectoris: Secondary | ICD-10-CM | POA: Insufficient documentation

## 2020-06-01 DIAGNOSIS — K449 Diaphragmatic hernia without obstruction or gangrene: Secondary | ICD-10-CM | POA: Insufficient documentation

## 2020-06-01 DIAGNOSIS — I700 Atherosclerosis of aorta: Secondary | ICD-10-CM | POA: Diagnosis not present

## 2020-06-01 LAB — GLUCOSE, CAPILLARY: Glucose-Capillary: 93 mg/dL (ref 70–99)

## 2020-06-01 MED ORDER — FLUDEOXYGLUCOSE F - 18 (FDG) INJECTION
9.2900 | Freq: Once | INTRAVENOUS | Status: AC
  Administered 2020-06-01: 9.29 via INTRAVENOUS

## 2020-06-07 ENCOUNTER — Encounter: Payer: Self-pay | Admitting: Internal Medicine

## 2020-06-07 MED FILL — VENCLEXTA 100 MG TABS: 100 | 30 days supply | Qty: 60 | Fill #1

## 2020-06-07 NOTE — Progress Notes (Signed)
History of Present Illness:       This very nice 79 y.o.  MBM presents for 6  month follow up with HTN, HLD, Pre-Diabetes and Vitamin D Deficiency. 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 Patient's GERD is controlled on his meds.       Patient is treated for HTN  (2004) & BP has been controlled at home. Today's BP is at goal - 140/74. Patient has had no complaints of any cardiac type chest pain, palpitations, dyspnea / orthopnea / PND, dizziness, claudication, or dependent edema.      Hyperlipidemia is controlled with diet & meds. Patient denies myalgias or other med SE's. Last Lipids were not at goal:  Lab Results  Component Value Date   CHOL 174 06/08/2020   HDL 41 06/08/2020   LDLCALC 113 (H) 06/08/2020   TRIG 92 06/08/2020   CHOLHDL 4.2 06/08/2020    Also, the patient has history of PreDiabetes (A1c 5.8% /2010) and has had no symptoms of reactive hypoglycemia, diabetic polys, paresthesias or visual blurring.  Last A1c was Normal & at goal:  Lab Results  Component Value Date   HGBA1C 5.4 06/08/2020           Further, the patient also has history of Vitamin D Deficiency and supplements vitamin D without any suspected side-effects. Last vitamin D was near goal (70-100):  Lab Results  Component Value Date   VD25OH 83 06/08/2020    Current Outpatient Medications on File Prior to Visit  Medication Sig  . ALPRAZolam (XANAX) 0.5 MG tablet TAKE 1/2 -1 TABLET BY MOUTH 2-3 TIMES A DAY ONLY IF NEEDED FOR ANXIETY ATTACK AND LIMIT TO 5 DAYS A WEEK TO AVOID ADDICTION  . Alum & Mag Hydroxide-Simeth (MYLANTA PO) Take by mouth as needed.  Marland Kitchen aspirin EC 81 MG tablet Take 81 mg by mouth daily.  . Cholecalciferol (VITAMIN D3) 5000 units TABS Take 5,000 Units by mouth daily.  Marland Kitchen losartan-hydrochlorothiazide (HYZAAR) 100-25 MG tablet Take 1 tablet Daily for BP & Fluid Retention / Ankle Swelling  . omeprazole (PRILOSEC) 40 MG capsule Take 1  capsule 2 x /day  for Indigestion & Heartburn  . sucralfate (CARAFATE) 1 g tablet Take 1 tablet (1 g total) by mouth 4 (four) times daily -  with meals and at bedtime.  Marland Kitchen venetoclax (VENCLEXTA) 100 MG TABS Take 200 mg by mouth daily. Take with food and water.  . triamcinolone (NASACORT) 55 MCG/ACT AERO nasal inhaler Place 2 sprays into the nose at bedtime. (Patient taking differently: Place 2 sprays into the nose as needed. )   No current facility-administered medications on file prior to visit.    Allergies  Allergen Reactions  . Penicillins Shortness Of Breath, Swelling and Other (See Comments)    Unknown allergic reaction as a teenager  . Ppd [Tuberculin Purified Protein Derivative]     Positive test in 2004    PMHx:   Past Medical History:  Diagnosis Date  . Diverticulosis   . Hyperlipidemia   . Hypertension   . Prediabetes   . Vitamin D deficiency     Immunization History  Administered Date(s) Administered  . Influenza Split 07/28/2013  . Influenza, High Dose Seasonal PF 09/11/2015, 06/09/2016, 09/15/2018  . Pneumococcal Conjugate-13 11/05/2017  . Pneumococcal-Unspecified 07/01/1999, 09/20/2009  . Td 09/20/2009  . Zoster 11/08/2009    Past Surgical History:  Procedure Laterality Date  . NO PAST  SURGERIES      FHx:    Reviewed / unchanged  SHx:    Reviewed / unchanged   Systems Review:  Constitutional: Denies fever, chills, wt changes, headaches, insomnia, fatigue, night sweats, change in appetite. Eyes: Denies redness, blurred vision, diplopia, discharge, itchy, watery eyes.  ENT: Denies discharge, congestion, post nasal drip, epistaxis, sore throat, earache, hearing loss, dental pain, tinnitus, vertigo, sinus pain, snoring.  CV: Denies chest pain, palpitations, irregular heartbeat, syncope, dyspnea, diaphoresis, orthopnea, PND, claudication or edema. Respiratory: denies cough, dyspnea, DOE, pleurisy, hoarseness, laryngitis, wheezing.  Gastrointestinal: Denies  dysphagia, odynophagia, heartburn, reflux, water brash, abdominal pain or cramps, nausea, vomiting, bloating, diarrhea, constipation, hematemesis, melena, hematochezia  or hemorrhoids. Genitourinary: Denies dysuria, frequency, urgency, nocturia, hesitancy, discharge, hematuria or flank pain. Musculoskeletal: Denies arthralgias, myalgias, stiffness, jt. swelling, pain, limping or strain/sprain.  Skin: Denies pruritus, rash, hives, warts, acne, eczema or change in skin lesion(s). Neuro: No weakness, tremor, incoordination, spasms, paresthesia or pain. Psychiatric: Denies confusion, memory loss or sensory loss. Endo: Denies change in weight, skin or hair change.  Heme/Lymph: No excessive bleeding, bruising or enlarged lymph nodes.  Physical Exam  BP 140/74   Pulse 64   Temp (!) 96.7 F (35.9 C)   Resp 16   Ht 5\' 11"  (1.803 m)   Wt 187 lb 3.2 oz (84.9 kg)   BMI 26.11 kg/m   Appears  well nourished, well groomed  and in no distress.  Eyes: PERRLA, EOMs, conjunctiva no swelling or erythema. Sinuses: No frontal/maxillary tenderness ENT/Mouth: EAC's clear, TM's nl w/o erythema, bulging. Nares clear w/o erythema, swelling, exudates. Oropharynx clear without erythema or exudates. Oral hygiene is good. Tongue normal, non obstructing. Hearing intact.  Neck: Supple. Thyroid not palpable. Car 2+/2+ without bruits, nodes or JVD. Chest: Respirations nl with BS clear & equal w/o rales, rhonchi, wheezing or stridor.  Cor: Heart sounds normal w/ regular rate and rhythm without sig. murmurs, gallops, clicks or rubs. Peripheral pulses normal and equal  without edema.  Abdomen: Soft & bowel sounds normal. Non-tender w/o guarding, rebound, hernias, masses or organomegaly.  Lymphatics: Unremarkable.  Musculoskeletal: Full ROM all peripheral extremities, joint stability, 5/5 strength and normal gait.  Skin: Warm, dry without exposed rashes, lesions or ecchymosis apparent.  Neuro: Cranial nerves intact,  reflexes equal bilaterally. Sensory-motor testing grossly intact. Tendon reflexes grossly intact.  Pysch: Alert & oriented x 3.  Insight and judgement nl & appropriate. No ideations.  Assessment and Plan:  1. Essential hypertension  - Continue medication, monitor blood pressure at home.  - Continue DASH diet.  Reminder to go to the ER if any CP,  SOB, nausea, dizziness, severe HA, changes vision/speech.  - CBC with Differential/Platelet - COMPLETE METABOLIC PANEL WITH GFR - Magnesium - TSH  2. Hyperlipidemia, mixed  - Continue diet/meds, exercise,& lifestyle modifications.  - Continue monitor periodic cholesterol/liver & renal functions   - Lipid panel - TSH  3. Abnormal glucose  - Continue diet, exercise  - Lifestyle modifications.  - Monitor appropriate labs.  - Hemoglobin A1c - Insulin, random  4. Vitamin D deficiency  - Continue supplementation.  - VITAMIN D 25 Hydroxy   5. Gastroesophageal reflux disease - CBC with Differential/Platelet  6. CLL (chronic lymphocytic leukemia) (HCC)  - CBC with Differential/Platelet  7. Medication management  - CBC with Differential/Platelet - COMPLETE METABOLIC PANEL WITH GFR - Magnesium - Lipid panel - TSH - Hemoglobin A1c - Insulin, random - VITAMIN D 25 Hydroxy  Discussed  regular exercise, BP monitoring, weight control to achieve/maintain BMI less than 25 and discussed med and SE's. Recommended labs to assess and monitor clinical status with further disposition pending results of labs.  I discussed the assessment and treatment plan with the patient. The patient was provided an opportunity to ask questions and all were answered. The patient agreed with the plan and demonstrated an understanding of the instructions.  I provided over 30 minutes of exam, counseling, chart review and  complex critical decision making.   Kirtland Bouchard, MD

## 2020-06-07 NOTE — Patient Instructions (Signed)

## 2020-06-08 ENCOUNTER — Ambulatory Visit (INDEPENDENT_AMBULATORY_CARE_PROVIDER_SITE_OTHER): Payer: Medicare HMO | Admitting: Internal Medicine

## 2020-06-08 ENCOUNTER — Other Ambulatory Visit: Payer: Self-pay

## 2020-06-08 VITALS — BP 140/74 | HR 64 | Temp 96.7°F | Resp 16 | Ht 71.0 in | Wt 187.2 lb

## 2020-06-08 DIAGNOSIS — E559 Vitamin D deficiency, unspecified: Secondary | ICD-10-CM

## 2020-06-08 DIAGNOSIS — Z79899 Other long term (current) drug therapy: Secondary | ICD-10-CM

## 2020-06-08 DIAGNOSIS — E782 Mixed hyperlipidemia: Secondary | ICD-10-CM | POA: Diagnosis not present

## 2020-06-08 DIAGNOSIS — R7309 Other abnormal glucose: Secondary | ICD-10-CM

## 2020-06-08 DIAGNOSIS — C9110 Chronic lymphocytic leukemia of B-cell type not having achieved remission: Secondary | ICD-10-CM | POA: Diagnosis not present

## 2020-06-08 DIAGNOSIS — I10 Essential (primary) hypertension: Secondary | ICD-10-CM | POA: Diagnosis not present

## 2020-06-08 DIAGNOSIS — K219 Gastro-esophageal reflux disease without esophagitis: Secondary | ICD-10-CM

## 2020-06-09 NOTE — Progress Notes (Signed)
==========================================================  -    CBC - OK - No Anemia & WBC looks Normal - Great ! ==========================================================  -  Total Chol = 174 -  Great   - Very low risk for Heart Attack  / Stroke  - But the LDL Chol = 113 is a little elevated, So..................  - Recommend a stricter low cholesterol diet   - Cholesterol only comes from animal sources  - ie. meat, dairy, egg yolks  - Eat all the vegetables you want.  - Avoid meat, especially red meat - Beef AND Pork .  - Avoid cheese & dairy - milk & ice cream.     - Cheese is the most concentrated form of trans-fats which  is the worst thing to clog up our arteries.   - Veggie cheese is OK which can be found in the fresh  produce section at [name] or [employer] or [employer] ==========================================================  -  A1c - Normal- Great !  No Diabetes ==========================================================  -  Vitamin D = 83 - Excellent  ==========================================================  -  All Else - Kidneys - Electrolytes - Liver - Magnesium & Thyroid    - all  Normal / OK ====================================================   - Keep up the Great Work ! =========================================================                       =============================================================  -

## 2020-06-11 LAB — CBC WITH DIFFERENTIAL/PLATELET
Absolute Monocytes: 499 cells/uL (ref 200–950)
Basophils Absolute: 12 cells/uL (ref 0–200)
Basophils Relative: 0.3 %
Eosinophils Absolute: 0 cells/uL — ABNORMAL LOW (ref 15–500)
Eosinophils Relative: 0 %
HCT: 39.2 % (ref 38.5–50.0)
Hemoglobin: 13 g/dL — ABNORMAL LOW (ref 13.2–17.1)
Lymphs Abs: 1205 cells/uL (ref 850–3900)
MCH: 31.1 pg (ref 27.0–33.0)
MCHC: 33.2 g/dL (ref 32.0–36.0)
MCV: 93.8 fL (ref 80.0–100.0)
MPV: 11.3 fL (ref 7.5–12.5)
Monocytes Relative: 12.8 %
Neutro Abs: 2184 cells/uL (ref 1500–7800)
Neutrophils Relative %: 56 %
Platelets: 171 10*3/uL (ref 140–400)
RBC: 4.18 10*6/uL — ABNORMAL LOW (ref 4.20–5.80)
RDW: 13.1 % (ref 11.0–15.0)
Total Lymphocyte: 30.9 %
WBC: 3.9 10*3/uL (ref 3.8–10.8)

## 2020-06-11 LAB — COMPLETE METABOLIC PANEL WITH GFR
AG Ratio: 2 (calc) (ref 1.0–2.5)
ALT: 9 U/L (ref 9–46)
AST: 11 U/L (ref 10–35)
Albumin: 4 g/dL (ref 3.6–5.1)
Alkaline phosphatase (APISO): 68 U/L (ref 35–144)
BUN: 15 mg/dL (ref 7–25)
CO2: 29 mmol/L (ref 20–32)
Calcium: 9.1 mg/dL (ref 8.6–10.3)
Chloride: 109 mmol/L (ref 98–110)
Creat: 1.05 mg/dL (ref 0.70–1.18)
GFR, Est African American: 78 mL/min/{1.73_m2} (ref >=60)
GFR, Est Non African American: 67 mL/min/{1.73_m2} (ref >=60)
Globulin: 2 g/dL (calc) (ref 1.9–3.7)
Glucose, Bld: 80 mg/dL (ref 65–99)
Potassium: 4.1 mmol/L (ref 3.5–5.3)
Sodium: 142 mmol/L (ref 135–146)
Total Bilirubin: 0.4 mg/dL (ref 0.2–1.2)
Total Protein: 6 g/dL — ABNORMAL LOW (ref 6.1–8.1)

## 2020-06-11 LAB — LIPID PANEL
Cholesterol: 174 mg/dL (ref <200)
HDL: 41 mg/dL (ref >=40)
LDL Cholesterol (Calc): 113 mg/dL (calc) — ABNORMAL HIGH
Non-HDL Cholesterol (Calc): 133 mg/dL (calc) — ABNORMAL HIGH (ref <130)
Total CHOL/HDL Ratio: 4.2 (calc) (ref <5.0)
Triglycerides: 92 mg/dL (ref <150)

## 2020-06-11 LAB — HEMOGLOBIN A1C
Hgb A1c MFr Bld: 5.4 % of total Hgb (ref <5.7)
Mean Plasma Glucose: 108 (calc)
eAG (mmol/L): 6 (calc)

## 2020-06-11 LAB — VITAMIN D 25 HYDROXY (VIT D DEFICIENCY, FRACTURES): Vit D, 25-Hydroxy: 83 ng/mL (ref 30–100)

## 2020-06-11 LAB — INSULIN, RANDOM: Insulin: 24.5 u[IU]/mL — ABNORMAL HIGH

## 2020-06-11 LAB — TSH: TSH: 0.86 mIU/L (ref 0.40–4.50)

## 2020-06-11 LAB — MAGNESIUM: Magnesium: 2.1 mg/dL (ref 1.5–2.5)

## 2020-06-14 ENCOUNTER — Other Ambulatory Visit: Payer: Medicare HMO

## 2020-06-14 ENCOUNTER — Ambulatory Visit: Payer: Medicare HMO | Admitting: Hematology

## 2020-06-21 ENCOUNTER — Ambulatory Visit: Payer: Medicare HMO | Admitting: Hematology

## 2020-06-21 ENCOUNTER — Other Ambulatory Visit: Payer: Medicare HMO

## 2020-06-22 ENCOUNTER — Other Ambulatory Visit: Payer: Self-pay

## 2020-06-22 ENCOUNTER — Inpatient Hospital Stay: Payer: Medicare HMO | Attending: Hematology

## 2020-06-22 ENCOUNTER — Inpatient Hospital Stay (HOSPITAL_BASED_OUTPATIENT_CLINIC_OR_DEPARTMENT_OTHER): Payer: Medicare HMO | Admitting: Hematology

## 2020-06-22 VITALS — BP 160/83 | HR 55 | Temp 97.5°F | Resp 20 | Ht 71.0 in | Wt 183.4 lb

## 2020-06-22 DIAGNOSIS — I10 Essential (primary) hypertension: Secondary | ICD-10-CM | POA: Insufficient documentation

## 2020-06-22 DIAGNOSIS — Z87891 Personal history of nicotine dependence: Secondary | ICD-10-CM | POA: Diagnosis not present

## 2020-06-22 DIAGNOSIS — E785 Hyperlipidemia, unspecified: Secondary | ICD-10-CM | POA: Insufficient documentation

## 2020-06-22 DIAGNOSIS — Z79899 Other long term (current) drug therapy: Secondary | ICD-10-CM | POA: Insufficient documentation

## 2020-06-22 DIAGNOSIS — E559 Vitamin D deficiency, unspecified: Secondary | ICD-10-CM | POA: Diagnosis not present

## 2020-06-22 DIAGNOSIS — Q9389 Other deletions from the autosomes: Secondary | ICD-10-CM

## 2020-06-22 DIAGNOSIS — C9110 Chronic lymphocytic leukemia of B-cell type not having achieved remission: Secondary | ICD-10-CM

## 2020-06-22 LAB — CBC WITH DIFFERENTIAL/PLATELET
Abs Immature Granulocytes: 0.01 10*3/uL (ref 0.00–0.07)
Basophils Absolute: 0 10*3/uL (ref 0.0–0.1)
Basophils Relative: 1 %
Eosinophils Absolute: 0 10*3/uL (ref 0.0–0.5)
Eosinophils Relative: 0 %
HCT: 39.6 % (ref 39.0–52.0)
Hemoglobin: 13.1 g/dL (ref 13.0–17.0)
Immature Granulocytes: 0 %
Lymphocytes Relative: 33 %
Lymphs Abs: 1.1 10*3/uL (ref 0.7–4.0)
MCH: 30.5 pg (ref 26.0–34.0)
MCHC: 33.1 g/dL (ref 30.0–36.0)
MCV: 92.3 fL (ref 80.0–100.0)
Monocytes Absolute: 0.6 10*3/uL (ref 0.1–1.0)
Monocytes Relative: 16 %
Neutro Abs: 1.7 10*3/uL (ref 1.7–7.7)
Neutrophils Relative %: 50 %
Platelets: 146 10*3/uL — ABNORMAL LOW (ref 150–400)
RBC: 4.29 MIL/uL (ref 4.22–5.81)
RDW: 13.7 % (ref 11.5–15.5)
WBC: 3.4 10*3/uL — ABNORMAL LOW (ref 4.0–10.5)
nRBC: 0 % (ref 0.0–0.2)

## 2020-06-22 LAB — CMP (CANCER CENTER ONLY)
ALT: 12 U/L (ref 0–44)
AST: 13 U/L — ABNORMAL LOW (ref 15–41)
Albumin: 3.7 g/dL (ref 3.5–5.0)
Alkaline Phosphatase: 74 U/L (ref 38–126)
Anion gap: 6 (ref 5–15)
BUN: 9 mg/dL (ref 8–23)
CO2: 27 mmol/L (ref 22–32)
Calcium: 9.4 mg/dL (ref 8.9–10.3)
Chloride: 110 mmol/L (ref 98–111)
Creatinine: 1.08 mg/dL (ref 0.61–1.24)
GFR, Est AFR Am: >60 mL/min (ref >60)
GFR, Estimated: >60 mL/min (ref >60)
Glucose, Bld: 90 mg/dL (ref 70–99)
Potassium: 3.5 mmol/L (ref 3.5–5.1)
Sodium: 143 mmol/L (ref 135–145)
Total Bilirubin: 0.7 mg/dL (ref 0.3–1.2)
Total Protein: 6.7 g/dL (ref 6.5–8.1)

## 2020-06-22 LAB — LACTATE DEHYDROGENASE: LDH: 173 U/L (ref 98–192)

## 2020-06-22 NOTE — Progress Notes (Signed)
HEMATOLOGY/ONCOLOGY CLINIC NOTE  Date of Service: 06/22/20   Patient Care Team: Javier Pinto, MD as PCP - General (Internal Medicine) Javier Castle, MD (Inactive) as Consulting Physician (Gastroenterology) Javier [name], Javier [name] as Referring Physician (Optometry) Javier [name], Javier Corin, MD as Consulting Physician (Gastroenterology) Brunetta Genera, MD as Consulting Physician (Hematology)  CHIEF COMPLAINTS/PURPOSE OF CONSULTATION:  F/u for CD5 neg CLL   HISTORY OF PRESENTING ILLNESS:   Javier [name] is a wonderful 79 y.o. male who has been referred to [place] by Dr .Javier Pinto, MD for evaluation and management of lymphocytosis concerning for a lymphoproliferative process.  Patient has a h/o HTN, HLD, Prediabetes who notes that he had significant abdominal discomfort with significant dyspeptic symptoms around aug 2017 and had about 20-25lbs weight loss and had an extensive GI workup including CT abd/pelvis 05/25/2016 - WNL, EGD which showed chronic gastritis (helicobacter pylori +ve) and colonscopy with some polyps. Capsule endoscopy was not approved by insurance. Patients symptoms improved after treatment of H.pylori and he notes that he has gained back about 6-7 lbs and is eating better.  Patients labs were noted to show some leucocytosis/Lymphocytosis for which he was referred to [place] for further evaluation.  Patient notes some nightsweats and weight loss (as noted above, possible alternative explanation). No fevers/chills. Has not noted any overt enlarged LN and CT abd in 04/2016 showed no hepatomegaly or splenomegaly and no abd/RP LNadenopathy.  Patient notes no other acute focal symptoms.   INTERVAL HISTORY: [name] is here for management and evaluation of his CLL. We are joined today by his wife. The patient's last visit with [place] was on 04/06/2020. The pt reports that he is doing well overall.  The pt reports that he is experiencing fatigue, indigestion, reflux and new  back pain. Pt is currently taking Sucralfate for his indigestion. His wife believes that there has been a significant change in the patient's memory recently. She has arranged for an evaluation with his PCP.   Of note since the patient's last visit, pt has had PET/CT ([id number]) completed on 06/01/2020 with results revealing "1. Stable small but hypermetabolic lymph nodes in the thorax, Deauville 5 level of activity. 2. Other imaging findings of potential clinical significance: Aortic Atherosclerosis (ICD10-I70.0). Coronary atherosclerosis. Sigmoid colon diverticulosis. Prostatomegaly. Small type 1 hiatal hernia."  Lab results today (06/22/20) of CBC w/diff and CMP is as follows: all values are WNL except for WBC at 3.4K, PLT at 146K, AST at 13. 06/22/2020 LDH at 173  On review of systems, pt reports fatigue, back pain, dyspepsia, reflux and denies N/V/D, fever, chills, night sweats, leg swelling, abdominal pain and any other symptoms.   MEDICAL HISTORY:  Past Medical History:  Diagnosis Date  . Diverticulosis   . Hyperlipidemia   . Hypertension   . Prediabetes   . Vitamin D deficiency   Helicobacter pylori +ve   SURGICAL HISTORY: Past Surgical History:  Procedure Laterality Date  . NO PAST SURGERIES      SOCIAL HISTORY: Social History   Socioeconomic History  . Marital status: Married    Spouse name: Not on file  . Number of children: 5  . Years of education: Not on file  . Highest education level: Not on file  Occupational History  . Occupation: retired  Tobacco Use  . Smoking status: Former Smoker    Types: Cigarettes    Quit date: 01/17/2003    Years since quitting: 17.4  . Smokeless tobacco: Never Used  Substance and Sexual Activity  . Alcohol use: Yes    Comment: very rare  . Drug use: No  . Sexual activity: Not on file  Other Topics Concern  . Not on file  Social History Narrative  . Not on file   Social Determinants of Health   Financial Resource Strain:    . Difficulty of Paying Living Expenses: Not on file  Food Insecurity:   . Worried About Charity fundraiser in the Last Year: Not on file  . Ran Out of Food in the Last Year: Not on file  Transportation Needs:   . Lack of Transportation (Medical): Not on file  . Lack of Transportation (Non-Medical): Not on file  Physical Activity:   . Days of Exercise per Week: Not on file  . Minutes of Exercise per Session: Not on file  Stress:   . Feeling of Stress : Not on file  Social Connections:   . Frequency of Communication with Friends and Family: Not on file  . Frequency of Social Gatherings with Friends and Family: Not on file  . Attends Religious Services: Not on file  . Active Member of Clubs or Organizations: Not on file  . Attends Archivist Meetings: Not on file  . Marital Status: Not on file  Intimate Partner Violence:   . Fear of Current or Ex-Partner: Not on file  . Emotionally Abused: Not on file  . Physically Abused: Not on file  . Sexually Abused: Not on file  Quit smoking >10 yrs ago, previously 1.5Packs per week. Started smoking in his teens. Retired. Worked as a Chief Financial Officer.   FAMILY HISTORY: Family History  Problem Relation Age of Onset  . Stroke Mother   . Early death Father        Farm/tractor accident  . Diabetes Brother   . Cirrhosis Brother   . Colon cancer Paternal Grandfather     ALLERGIES:  is allergic to penicillins and ppd [tuberculin purified protein derivative].  MEDICATIONS:  Current Outpatient Medications  Medication Sig Dispense Refill  . ALPRAZolam (XANAX) 0.5 MG tablet TAKE 1/2 -1 TABLET BY MOUTH 2-3 TIMES A DAY ONLY IF NEEDED FOR ANXIETY ATTACK AND LIMIT TO 5 DAYS A WEEK TO AVOID ADDICTION 90 tablet 0  . Alum & Mag Hydroxide-Simeth (MYLANTA PO) Take by mouth as needed.    Marland Kitchen aspirin EC 81 MG tablet Take 81 mg by mouth daily.    . Cholecalciferol (VITAMIN D3) 5000 units TABS Take 5,000 Units by mouth daily.    Marland Kitchen  losartan-hydrochlorothiazide (HYZAAR) 100-25 MG tablet Take 1 tablet Daily for BP & Fluid Retention / Ankle Swelling 90 tablet 0  . omeprazole (PRILOSEC) 40 MG capsule Take 1 capsule 2 x /day  for Indigestion & Heartburn 180 capsule 3  . sucralfate (CARAFATE) 1 g tablet Take 1 tablet (1 g total) by mouth 4 (four) times daily -  with meals and at bedtime. 120 tablet 1  . triamcinolone (NASACORT) 55 MCG/ACT AERO nasal inhaler Place 2 sprays into the nose at bedtime. (Patient taking differently: Place 2 sprays into the nose as needed. ) 1 Inhaler 3  . venetoclax (VENCLEXTA) 100 MG TABS Take 200 mg by mouth daily. Take with food and water. 60 tablet 3   No current facility-administered medications for this visit.    REVIEW OF SYSTEMS:   A 10+ POINT REVIEW OF SYSTEMS WAS OBTAINED including neurology, dermatology, psychiatry, cardiac, respiratory, lymph, extremities, GI, GU, Musculoskeletal, constitutional, breasts,  reproductive, HEENT.  All pertinent positives are noted in the HPI.  All others are negative.   PHYSICAL EXAMINATION: ECOG PERFORMANCE STATUS: 1 - Symptomatic but completely ambulatory  Vitals:   06/22/20 0846  BP: (!) 160/83  Pulse: (!) 55  Resp: 20  Temp: (!) 97.5 F (36.4 C)  SpO2: 98%   Filed Weights   06/22/20 0846  Weight: 183 lb 6.4 oz (83.2 kg)   .Body mass index is 25.58 kg/m.   GENERAL:alert, in no acute distress and comfortable SKIN: no acute rashes, no significant lesions EYES: conjunctiva are pink and non-injected, sclera anicteric OROPHARYNX: MMM, no exudates, no oropharyngeal erythema or ulceration NECK: supple, no JVD LYMPH:  no palpable lymphadenopathy in the cervical, axillary or inguinal regions LUNGS: clear to auscultation b/l with normal respiratory effort HEART: regular rate & rhythm ABDOMEN:  normoactive bowel sounds , non tender, not distended. No palpable hepatosplenomegaly.  Extremity: no pedal edema PSYCH: alert & oriented x 3 with fluent  speech NEURO: no focal motor/sensory deficits  LABORATORY DATA:  I have reviewed the data as listed  . CBC Latest Ref Rng & Units 06/22/2020 06/08/2020 04/06/2020  WBC 4.0 - 10.5 K/uL 3.4(L) 3.9 3.6(L)  Hemoglobin 13.0 - 17.0 g/dL 13.1 13.0(L) 12.7(L)  Hematocrit 39 - 52 % 39.6 39.2 38.8(L)  Platelets 150 - 400 K/uL 146(L) 171 164   . CBC    Component Value Date/Time   WBC 3.4 (L) 06/22/2020 0827   RBC 4.29 06/22/2020 0827   HGB 13.1 06/22/2020 0827   HGB 12.2 (L) 01/03/2019 1509   HGB 14.5 10/21/2016 1324   HCT 39.6 06/22/2020 0827   HCT 43.8 10/21/2016 1324   PLT 146 (L) 06/22/2020 0827   PLT 152 01/03/2019 1509   PLT 157 10/21/2016 1324   MCV 92.3 06/22/2020 0827   MCV 90.9 10/21/2016 1324   MCH 30.5 06/22/2020 0827   MCHC 33.1 06/22/2020 0827   RDW 13.7 06/22/2020 0827   RDW 13.3 10/21/2016 1324   LYMPHSABS 1.1 06/22/2020 0827   LYMPHSABS 17.6 (H) 10/21/2016 1324   MONOABS 0.6 06/22/2020 0827   MONOABS 0.6 10/21/2016 1324   EOSABS 0.0 06/22/2020 0827   EOSABS 0.0 10/21/2016 1324   BASOSABS 0.0 06/22/2020 0827   BASOSABS 0.1 10/21/2016 1324     . CMP Latest Ref Rng & Units 06/22/2020 06/08/2020 04/06/2020  Glucose 70 - 99 mg/dL 90 80 89  BUN 8 - 23 mg/dL _0 Creatinine 0.61 - 1.24 mg/dL 1.08 1.05 1.07  Sodium 135 - 145 mmol/L 143 142 140  Potassium 3.5 - 5.1 mmol/L 3.5 4.1 4.1  Chloride 98 - 111 mmol/L 110 109 110  CO2 22 - 32 mmol/L _1 Calcium 8.9 - 10.3 mg/dL 9.4 9.1 9.3  Total Protein 6.5 - 8.1 g/dL 6.7 6.0(L) 6.3(L)  Total Bilirubin 0.3 - 1.2 mg/dL 0.7 0.4 0.6  Alkaline Phos 38 - 126 U/L 74 - 72  AST 15 - 41 U/L 13(L) 11 15  ALT 0 - 44 U/L _2 . Lab Results  Component Value Date   LDH 173 06/22/2020          RADIOGRAPHIC STUDIES: I have personally reviewed the radiological images as listed and agreed with the findings in the report. NM PET Image Restag (PS) Skull Base To Thigh  Result Date: 06/01/2020 CLINICAL DATA:  Initial  treatment strategy for chronic lymphocytic leukemia, currently on oral chemotherapy. EXAM: NUCLEAR MEDICINE PET  SKULL BASE TO THIGH TECHNIQUE: 9.3 mCi F-18 FDG was injected intravenously. Full-ring PET imaging was performed from the skull base to thigh after the radiotracer. CT data was obtained and used for attenuation correction and anatomic localization. Fasting blood glucose: 93 mg/dl COMPARISON:  Multiple exams, including 12/01/2018 FINDINGS: Mediastinal blood pool activity: SUV max 2.4 Liver activity: SUV max 3.6 NECK: Dental activity, likely incidental. No significant abnormal hypermetabolic activity in this region. Incidental CT findings: none CHEST: Scattered paratracheal, hilar, AP window, subcarinal, and infrahilar lymph nodes are observed. The right lower paratracheal index lymph node measures 0.7 cm in short axis on image 64/4 (stable) with maximum SUV of 7.2, Deauville 5 (formerly 10.1). Index subcarinal lymph node measures 0.8 cm in short axis on image 72/4 (stable) with maximum SUV 8.3, Deauville 5 (formerly 7.2). The other scattered small lymph nodes are mostly in the Deauville 4 to Deauville 5 range, similar to previous. Overall the appearance is stable. Small type 1 hiatal hernia with mildly accentuated activity, maximum SUV 4.6 and previously 5.0, likely physiologic. Incidental CT findings: Coronary, aortic arch, and branch vessel atherosclerotic vascular disease. Paraseptal and centrilobular emphysema. ABDOMEN/PELVIS: Physiologic activity in bowel. Incidental CT findings: Aortoiliac atherosclerotic vascular disease. Sigmoid colon diverticulosis. Prostatomegaly. SKELETON: No significant abnormal hypermetabolic activity in this region. Incidental CT findings: none IMPRESSION: 1. Stable small but hypermetabolic lymph nodes in the thorax, Deauville 5 level of activity. 2. Other imaging findings of potential clinical significance: Aortic Atherosclerosis (ICD10-I70.0). Coronary atherosclerosis.  Sigmoid colon diverticulosis. Prostatomegaly. Small type 1 hiatal hernia. Electronically Signed   By: Van Clines M.D.   On: 06/01/2020 13:20    ASSESSMENT & PLAN:   79 y.o. 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 FISH panel showed p53 (17p13) deletion that would be consistent with this diagnosis . It often represents also be more aggressive form of CLL Differential diagnosis includes - CD5 neg CLL vs splenic lymphoma vs Marginal Zone lymphoma vs LPL (lymphoplasmacytic lymphoma) LDH WNL suggests against a high grade process. Currently no anemia or thrombocytopenia noted. Now with drenching night sweats and fatigue, no fevers/chills.  10/31/16 PET/CT scan shows some small hypermetabolic nodes in the thorax including bilateral hilar paratracheal) esophageal. These would be difficult to biopsy.  Lost to follow up between 11/06/16 and 11/19/18  12/01/18 PET/CT revealed Stable scattered small hypermetabolic thoracic lymph nodes, once again at Deauville 5 activity level. 2. No findings of involvement in the neck, abdomen/pelvis, or skeleton. 3. Prostatomegaly. 4. Aortic Atherosclerosis and Emphysema 5. Sigmoid colon diverticulosis  PLAN: -Discussed pt labwork today, 06/22/20; blood counts and chemistries look good, LDH is WNL  -Discussed 06/01/2020 PET/CT ([id number]) which revealed "1. Stable small but hypermetabolic lymph nodes in the thorax, Deauville 5 level of activity. 2. Other imaging findings of potential clinical significance: Aortic Atherosclerosis (ICD10-I70.0). Coronary atherosclerosis. Sigmoid colon diverticulosis. Prostatomegaly. Small type 1 hiatal hernia." -No lab, clinical, or radiographic evidence of significant disease burden at this time. Will hold Venetoclax at this time.  -Recommended that the pt continue to eat well, drink at least 48-64 oz of water each day, and walk 20-30 minutes each day.  -Recommend pt use OTC  probiotics or live cultured yogurt -Recommend pt take Prilosec as prescribed for dyspepsia and reflux -Recommend pt f/u with PCP for back pain and memory evaluation.  -Continue Cholcalciferol as prescribed -Will see back in 3 months with labs     2) . Patient Active Problem List  Diagnosis Date Noted  . BMI 26.0-26.9,adult 03/06/2020  . Gastric AVM 03/06/2020  . History of colon polyps 03/06/2020  . Aortic atherosclerosis ([place]) 08/23/2019  . Memory changes 05/25/2019  . Counseling regarding advance care planning and goals of care 01/24/2019  . Anxiety 02/08/2018  . CLL (chronic lymphocytic leukemia) ([place]) 11/01/2016  . Encounter for Medicare annual wellness exam 09/11/2015  . GERD  02/09/2015  . Medication management 11/02/2013  . Hyperlipidemia   . Hypertension   . Abnormal glucose   . Vitamin D deficiency   . Diverticulosis    -f/u with PCP for management of other medical co-morbids  3) H.pylori gastritis -f/u with GI to ensure eradication of h.pylori post treatment.   FOLLOW UP: RTC with Dr Irene Limbo with labs in 3 months   The total time spent in the appt was 20 minutes and more than 50% was on counseling and direct patient cares.  All of the patient's questions were answered with apparent satisfaction. The patient knows to call the clinic with any problems, questions or concerns.   Sullivan Lone MD [place] AAHIVMS [facility] Hematology/Oncology Physician [facility]  (Office):       [phone] (Work cell):  [phone] (Fax):           [phone]  06/22/2020 9:38 AM  I, Yevette Edwards, am acting as a scribe for Dr. Sullivan Lone.   .I have reviewed the above documentation for accuracy and completeness, and I agree with the above. Brunetta Genera MD

## 2020-08-19 DIAGNOSIS — R5381 Other malaise: Secondary | ICD-10-CM | POA: Diagnosis not present

## 2020-08-19 DIAGNOSIS — R69 Illness, unspecified: Secondary | ICD-10-CM | POA: Diagnosis not present

## 2020-09-03 ENCOUNTER — Other Ambulatory Visit: Payer: Self-pay | Admitting: Internal Medicine

## 2020-09-03 DIAGNOSIS — I10 Essential (primary) hypertension: Secondary | ICD-10-CM

## 2020-09-10 ENCOUNTER — Ambulatory Visit (INDEPENDENT_AMBULATORY_CARE_PROVIDER_SITE_OTHER): Payer: Medicare HMO | Admitting: Adult Health Nurse Practitioner

## 2020-09-10 ENCOUNTER — Encounter: Payer: Self-pay | Admitting: Adult Health Nurse Practitioner

## 2020-09-10 ENCOUNTER — Other Ambulatory Visit: Payer: Self-pay

## 2020-09-10 VITALS — BP 132/68 | HR 73 | Temp 97.3°F | Wt 183.0 lb

## 2020-09-10 DIAGNOSIS — R300 Dysuria: Secondary | ICD-10-CM

## 2020-09-10 DIAGNOSIS — R1013 Epigastric pain: Secondary | ICD-10-CM

## 2020-09-10 DIAGNOSIS — R7309 Other abnormal glucose: Secondary | ICD-10-CM

## 2020-09-10 DIAGNOSIS — R1084 Generalized abdominal pain: Secondary | ICD-10-CM

## 2020-09-10 DIAGNOSIS — E782 Mixed hyperlipidemia: Secondary | ICD-10-CM

## 2020-09-10 DIAGNOSIS — R5381 Other malaise: Secondary | ICD-10-CM | POA: Diagnosis not present

## 2020-09-10 DIAGNOSIS — E559 Vitamin D deficiency, unspecified: Secondary | ICD-10-CM | POA: Diagnosis not present

## 2020-09-10 DIAGNOSIS — K219 Gastro-esophageal reflux disease without esophagitis: Secondary | ICD-10-CM | POA: Diagnosis not present

## 2020-09-10 DIAGNOSIS — R5383 Other fatigue: Secondary | ICD-10-CM

## 2020-09-10 DIAGNOSIS — K5790 Diverticulosis of intestine, part unspecified, without perforation or abscess without bleeding: Secondary | ICD-10-CM

## 2020-09-10 DIAGNOSIS — I700 Atherosclerosis of aorta: Secondary | ICD-10-CM

## 2020-09-10 DIAGNOSIS — I10 Essential (primary) hypertension: Secondary | ICD-10-CM | POA: Diagnosis not present

## 2020-09-10 DIAGNOSIS — C9110 Chronic lymphocytic leukemia of B-cell type not having achieved remission: Secondary | ICD-10-CM

## 2020-09-10 DIAGNOSIS — R142 Eructation: Secondary | ICD-10-CM

## 2020-09-10 NOTE — Progress Notes (Signed)
Assessment:    Essential hypertension Noncompliant with medications - wife will start monitoring Call in 2 weeks to check progress - declines to schedule recheck due to will be traveling  Monitor blood pressure at home; call if consistently over 130/80 Continue DASH diet.   Reminder to go to the ER if any CP, SOB, nausea, dizziness, severe HA, changes vision/speech, left arm numbness and tingling and jaw pain.   Mixed hyperlipidemia Aortic atherosclerosis (HCC) - Per CT 04/2016 Off of pravastatin per Dr. Irene Limbo due to CLL tx Continue low cholesterol diet and exercise.  Check lipid panel.   Gastroesophageal reflux disease with esophagitis Not taking meds at this time, Has completed course of Omeprazole S/P ulcer, Carafate in past without resolution of symptoms, no longer taking Discussed diet, avoiding triggers and other lifestyle changes  CLL (chronic lymphocytic leukemia) ([place]) managed by Dr. Susanne Greenhouse x 1 year; holding pravastatin in the interim  Diverticulosis Overdue for follow up with GI for Colonoscopy Bowel management discussed,   Vitamin D deficiency At goal at recent check; continue to recommend supplementation for goal of 70-100 Defer vitamin D level  Other abnormal glucose  Recent A1Cs at goal Discussed diet/exercise, weight management  Defer A1C; check CMP   Medication management CBC, CMP/GFR, magnesium when able   Fatigue, unspecified type -CMP -     POC Hemoccult Bld/Stl (3-Cd Home Screen); Future  Epigastric pain Burping Generalized abdominal pain Discussed acid reducer, declined this as did not help previously Discussed possible return of ulcer? No noted irritants cafeine, carbonated beverages, drinking through straw Discussed CT is Hgb in range vs GI for another EGD, due for colonoscopy.  Dysuria -     Urinalysis w microscopic + reflex cultur        Further disposition pending results if labs check today. Discussed med's effects and  SE's.   Over 40 minutes of face to face interview, exam, counseling, chart review, and critical decision making was performed.     Future Appointments  Date Time Provider [place]  09/26/2020  1:45 PM [facility] None  09/26/2020  2:20 PM Brunetta Genera, MD [facility] None  12/13/2020 11:00 AM Unk Pinto, MD [facility] None       Subjective:  Javier [name] is a 79 y.o. male who presents for Medicare Annual Wellness Visit and 3 month follow up for HTN, hyperlipidemia, glucose management, and vitamin D Def.   [name] is with him today.   He's also followed by Dr Irene Limbo for smouldering Stage 0 CLL, taking Venetoclax Has some weakness/fatigue, edema, legs give out occasionally since starting med, have been advised to increase fluid and hold pravastatin while taking. Dr. Irene Limbo ordered ECHO  He underwent EGD 01/26/2020 by Dr. 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 On omeprazole 40 mg BID, carafate, mylanta but admits not taking regularly as prescribed, having lots of belching/burping. Seems food specific, intermittent but unsure what.   He reported progressive memory changes in 04/2019, "slow", had normal labs and was referred for further evaluation to Dr. Leta Baptist but apparently patient didn't want to see another specialist at this time, wife feels memory changes are mild, wants to defer for now.   Reports he is having a burning in his stomach area for the past couple months.  He is having excessive burping with foods and liquids.  His wife reports he is burping in the middle of the night.  He is also having flatuance  and stomach bloating.  He was drinking mylanta frequently and that helped some. He is not long taking the omeprazole anymore or any carafate.She reports that he says he feels weak.  He is also having frequency of urination ,gets up at least four times a night to use the bathroom.   he has a diagnosis  of anxiety and is currently on xanax 0.25 mg QID PRN, reports symptoms are well controlled on current regimen.  BMI is Body mass index is 25.52 kg/m., he has been working on diet, takes short walks ~5 min 4 times daily, also working in yard [employer] from Last 3 Encounters:  09/10/20 183 lb (83 kg)  06/22/20 183 lb 6.4 oz (83.2 kg)  06/08/20 187 lb 3.2 oz (84.9 kg)   His blood pressure has been controlled at home, today their BP is BP: 132/68 Admits hasn't been taking BP medication, didn't take this AM. Wife didn't realize - states she will start monitoring BP and med compliance.  He does workout. He denies chest pain, shortness of breath, dizziness.   He is on cholesterol medication (off of pravastatin while taking venclexa per Dr. Irene Limbo) and denies myalgias. His cholesterol is not at goal. The cholesterol last visit was:   Lab Results  Component Value Date   CHOL 174 06/08/2020   HDL 41 06/08/2020   LDLCALC 113 (H) 06/08/2020   TRIG 92 06/08/2020   CHOLHDL 4.2 06/08/2020   He has been working on diet and exercise for glucose management, and denies foot ulcerations, increased appetite, nausea, paresthesia of the feet, polydipsia, polyuria, visual disturbances, vomiting and weight loss. Last A1C in the office was:  Lab Results  Component Value Date   HGBA1C 5.4 06/08/2020   Last GFR Lab Results  Component Value Date   GFRAA >60 06/22/2020   Patient is on Vitamin D supplement.   Lab Results  Component Value Date   VD25OH 83 06/08/2020      Medication Review:   Current Outpatient Medications (Cardiovascular):  .  losartan-hydrochlorothiazide (HYZAAR) 100-25 MG tablet, Take      1 tablet     Daily      for BP & Fluid Retention /Ankle Swelling         (TAKE 1 TABLET BY MOUTH ONCE DAILY FOR BLOOD PRESSURE & FLUID RETENTION/ANKLE SWELLING)  Current Outpatient Medications (Respiratory):  .  triamcinolone (NASACORT) 55 MCG/ACT AERO nasal inhaler, Place 2 sprays into the nose at  bedtime. (Patient taking differently: Place 2 sprays into the nose as needed. )  Current Outpatient Medications (Analgesics):  .  aspirin EC 81 MG tablet, Take 81 mg by mouth daily.   Current Outpatient Medications (Other):  Marland Kitchen  ALPRAZolam (XANAX) 0.5 MG tablet, TAKE 1/2 -1 TABLET BY MOUTH 2-3 TIMES A DAY ONLY IF NEEDED FOR ANXIETY ATTACK AND LIMIT TO 5 DAYS A WEEK TO AVOID ADDICTION .  Alum & Mag Hydroxide-Simeth (MYLANTA PO), Take by mouth as needed. .  Cholecalciferol (VITAMIN D3) 5000 units TABS, Take 5,000 Units by mouth daily. Marland Kitchen  venetoclax (VENCLEXTA) 100 MG TABS, Take 200 mg by mouth daily. Take with food and water.  Allergies: Allergies  Allergen Reactions  . Penicillins Shortness Of Breath, Swelling and Other (See Comments)    Unknown allergic reaction as a teenager  . Ppd [Tuberculin Purified Protein Derivative]     Positive test in 2004    Current Problems (verified) has Hyperlipidemia; Hypertension; Abnormal glucose; Vitamin D deficiency; Diverticulosis; Medication management; GERD ;  Encounter for Medicare annual wellness exam; CLL (chronic lymphocytic leukemia) ([place]); Anxiety; Counseling regarding advance care planning and goals of care; Memory changes; Aortic atherosclerosis ([place]); BMI 26.0-26.9,adult; Gastric AVM; and History of colon polyps on their problem list.  Screening Tests Immunization History  Administered Date(s) Administered  . Influenza Split 07/28/2013  . Influenza, High Dose Seasonal PF 09/11/2015, 06/09/2016, 09/15/2018  . Pneumococcal Conjugate-13 11/05/2017  . Pneumococcal-Unspecified 07/01/1999, 09/20/2009  . Td 09/20/2009  . Zoster 11/08/2009   Preventative care: Last colonoscopy: 2015, Dr. Deatra Ina, polyps, 3 year follow up  was dismissed from [facility], now established with Dr. Earlean Shawl, EGD 2018, 12/2019, Dr. Earlean Shawl, bleeding gastric AVM PET 11/2018  Prior vaccinations: TD or Tdap: 2010 will boost PRN Influenza: 2019 Pneumococcal:  2010 Prevnar13: 2019 Shingles/Zostavax: 2011 Covid 19: 2/2, 2021, pfizer   Names of Other Physician/Practitioners you currently use: 1. [facility] here for primary care 2. Dr. Delman Cheadle, eye doctor, last visit 2021, glasses 3. Dr. Wynetta Emery, dentist, last visit 2021, q6-106m  Patient Care Team: Unk Pinto, MD as PCP - General (Internal Medicine) Inda Castle, MD (Inactive) as Consulting Physician (Gastroenterology) Melissa Noon, [place] as Referring Physician (Optometry) Danis, Kirke Corin, MD as Consulting Physician (Gastroenterology) Brunetta Genera, MD as Consulting Physician (Hematology)  Surgical: He  has a past surgical history that includes No past surgeries. Family His family history includes Cirrhosis in his brother; Colon cancer in his paternal grandfather; Diabetes in his brother; Early death in his father; Stroke in his mother. Social history  He reports that he quit smoking about 17 years ago. His smoking use included cigarettes. He has never used smokeless tobacco. He reports current alcohol use. He reports that he does not use drugs.   Objective:   Today's Vitals   09/10/20 1054  BP: 132/68  Pulse: 73  Temp: (!) 97.3 F (36.3 C)  SpO2: 98%  Weight: 183 lb (83 kg)   Body mass index is 25.52 kg/m.  General Appearance: Well nourished, in no apparent distress. Eyes: PERRLA, conjunctiva no swelling or erythema ENT/Mouth:  No erythema, swelling, or exudate on post pharynx.  Tonsils not swollen or erythematous. Hearing normal.  Neck: Supple, thyroid normal.  Respiratory: Respiratory effort normal, BS equal bilaterally without rales, rhonchi, wheezing or stridor.  Cardio: RRR with no MRGs. Brisk peripheral pulses, bil pitting edema in ankles Abdomen: Soft, + BS.  No tenderness, no guarding, rebound, hernias, palpable masses. Epigastric tenderness, mild. Lymphatics: Non tender without lymphadenopathy.  Musculoskeletal: No  obvious deformity, Symmetrical strength, normal gait.  Skin: Warm, dry without rashes, lesions, ecchymosis.  Neuro: Normal muscle tone, slowed cognition Psych: Awake and oriented X 3, normal affect, Insight and Judgment appropriate.    [name], Laqueta Jean, DNP [facility] 09/10/2020  1:11 PM

## 2020-09-10 NOTE — Patient Instructions (Addendum)
We will check labs today and respond via phone call in 1-2 days.  We have sent you home with collection materials to check for blood in stool sample related to your abdominal burring and weakness.  Should all lab results come back in normal range. We will order a CT of the abdomin at [facility].  If the hemoglobin number is low, we should reach out to Dr Earlean Shawl with his history of bleeding ulcer in the past.   Avoid carbonated beverages and drinking through straws.

## 2020-09-11 ENCOUNTER — Other Ambulatory Visit: Payer: Self-pay | Admitting: Adult Health Nurse Practitioner

## 2020-09-11 DIAGNOSIS — R1084 Generalized abdominal pain: Secondary | ICD-10-CM

## 2020-09-11 LAB — COMPLETE METABOLIC PANEL WITH GFR
AG Ratio: 1.8 (calc) (ref 1.0–2.5)
ALT: 8 U/L — ABNORMAL LOW (ref 9–46)
AST: 10 U/L (ref 10–35)
Albumin: 4.1 g/dL (ref 3.6–5.1)
Alkaline phosphatase (APISO): 62 U/L (ref 35–144)
BUN: 17 mg/dL (ref 7–25)
CO2: 30 mmol/L (ref 20–32)
Calcium: 10.1 mg/dL (ref 8.6–10.3)
Chloride: 106 mmol/L (ref 98–110)
Creat: 1.09 mg/dL (ref 0.70–1.18)
GFR, Est African American: 74 mL/min/{1.73_m2} (ref >=60)
GFR, Est Non African American: 64 mL/min/{1.73_m2} (ref >=60)
Globulin: 2.3 g/dL (calc) (ref 1.9–3.7)
Glucose, Bld: 81 mg/dL (ref 65–99)
Potassium: 4 mmol/L (ref 3.5–5.3)
Sodium: 143 mmol/L (ref 135–146)
Total Bilirubin: 0.7 mg/dL (ref 0.2–1.2)
Total Protein: 6.4 g/dL (ref 6.1–8.1)

## 2020-09-11 LAB — LIPID PANEL
Cholesterol: 208 mg/dL — ABNORMAL HIGH (ref <200)
HDL: 44 mg/dL (ref >=40)
LDL Cholesterol (Calc): 143 mg/dL (calc) — ABNORMAL HIGH
Non-HDL Cholesterol (Calc): 164 mg/dL (calc) — ABNORMAL HIGH (ref <130)
Total CHOL/HDL Ratio: 4.7 (calc) (ref <5.0)
Triglycerides: 98 mg/dL (ref <150)

## 2020-09-11 LAB — URINALYSIS W MICROSCOPIC + REFLEX CULTURE
Bacteria, UA: NONE SEEN /HPF
Bilirubin Urine: NEGATIVE
Glucose, UA: NEGATIVE
Hgb urine dipstick: NEGATIVE
Hyaline Cast: NONE SEEN /LPF
Ketones, ur: NEGATIVE
Leukocyte Esterase: NEGATIVE
Nitrites, Initial: NEGATIVE
Protein, ur: NEGATIVE
RBC / HPF: NONE SEEN /HPF (ref 0–2)
Specific Gravity, Urine: 1.015 (ref 1.001–1.03)
Squamous Epithelial / HPF: NONE SEEN /HPF (ref <=5)
WBC, UA: NONE SEEN /HPF (ref 0–5)
pH: 7.5 (ref 5.0–8.0)

## 2020-09-11 LAB — TSH: TSH: 0.83 mIU/L (ref 0.40–4.50)

## 2020-09-11 LAB — CBC WITH DIFFERENTIAL/PLATELET
Absolute Monocytes: 520 cells/uL (ref 200–950)
Basophils Absolute: 20 cells/uL (ref 0–200)
Basophils Relative: 0.4 %
Eosinophils Absolute: 40 cells/uL (ref 15–500)
Eosinophils Relative: 0.8 %
HCT: 42.9 % (ref 38.5–50.0)
Hemoglobin: 14 g/dL (ref 13.2–17.1)
Lymphs Abs: 1235 cells/uL (ref 850–3900)
MCH: 30.2 pg (ref 27.0–33.0)
MCHC: 32.6 g/dL (ref 32.0–36.0)
MCV: 92.7 fL (ref 80.0–100.0)
MPV: 11.6 fL (ref 7.5–12.5)
Monocytes Relative: 10.4 %
Neutro Abs: 3185 cells/uL (ref 1500–7800)
Neutrophils Relative %: 63.7 %
Platelets: 142 10*3/uL (ref 140–400)
RBC: 4.63 10*6/uL (ref 4.20–5.80)
RDW: 12.2 % (ref 11.0–15.0)
Total Lymphocyte: 24.7 %
WBC: 5 10*3/uL (ref 3.8–10.8)

## 2020-09-11 LAB — NO CULTURE INDICATED

## 2020-09-19 ENCOUNTER — Telehealth: Payer: Self-pay | Admitting: Hematology

## 2020-09-19 ENCOUNTER — Encounter: Payer: Medicare HMO | Admitting: Internal Medicine

## 2020-09-19 ENCOUNTER — Ambulatory Visit
Admission: RE | Admit: 2020-09-19 | Discharge: 2020-09-19 | Disposition: A | Discharge status: Home / Self Care | Payer: Medicare HMO | Source: Ambulatory Visit | Attending: Adult Health Nurse Practitioner | Admitting: Adult Health Nurse Practitioner

## 2020-09-19 DIAGNOSIS — R109 Unspecified abdominal pain: Secondary | ICD-10-CM | POA: Diagnosis not present

## 2020-09-19 DIAGNOSIS — R1084 Generalized abdominal pain: Secondary | ICD-10-CM

## 2020-09-19 DIAGNOSIS — I700 Atherosclerosis of aorta: Secondary | ICD-10-CM | POA: Diagnosis not present

## 2020-09-19 MED ORDER — IOPAMIDOL (ISOVUE-300) INJECTION 61%
100.0000 mL | Freq: Once | INTRAVENOUS | Status: AC | PRN
  Administered 2020-09-19: 100 mL via INTRAVENOUS

## 2020-09-19 NOTE — Telephone Encounter (Signed)
Rescheduled 12/29 appointment to January per provider pal, patient has been called and notified.

## 2020-09-24 NOTE — Progress Notes (Signed)
Patient's CT scan results and recommendations were given to his wife [name]). -e welch

## 2020-09-26 ENCOUNTER — Other Ambulatory Visit: Payer: Medicare HMO

## 2020-09-26 ENCOUNTER — Ambulatory Visit: Payer: Medicare HMO | Admitting: Hematology

## 2020-10-02 ENCOUNTER — Other Ambulatory Visit: Payer: Medicare HMO

## 2020-10-02 DIAGNOSIS — H905 Unspecified sensorineural hearing loss: Secondary | ICD-10-CM | POA: Diagnosis not present

## 2020-10-04 ENCOUNTER — Telehealth: Payer: Self-pay | Admitting: *Deleted

## 2020-10-04 NOTE — Telephone Encounter (Signed)
Unable to reach patient, so left a VM informing spouse to call Dr Kinnie Scales, his GI doctor, in regard to scheduling a colonoscopy. Per Dr Jennye Boroughs note, the patient was unable to schedule the colonoscopy in 02/2020, due to being out of town.

## 2020-10-08 ENCOUNTER — Telehealth: Payer: Self-pay

## 2020-10-08 ENCOUNTER — Other Ambulatory Visit: Payer: Self-pay

## 2020-10-08 NOTE — Patient Outreach (Addendum)
[facility]) Care Management  10/08/2020  RENNER SEBALD 07/18/1941 [id number]   Telephone Screen  Referral Date: 10/08/2020 Referral Source: [facility] Referral Reason: "Patient's PCP is out currently, in remission for leukemia, had EGD about 4 months-had clamps placed for bleeding, patient would like assistance navigating his healthcare" Insurance: [employer]   Outreach attempt # 1 to patient. Spoke with patient who gave verbal consent to speak with spouse. Spouse voices that patient has been dealing with ongoing GI issues off and on for almost two years. He is scheduled to have colonoscopy in May. Spouse voices that this it too long for them to wait and wants it done sooner. However, GI MD who procedure is scheduled with is currently out to the office due to sickness. She is wanting patient to see another GI MD to have procedure done sooner. Advised spouse that patient would need a new GI MD referral for new MD. Discussed with her if she had spoken with PCP regarding getting new referral. She states she was int he office to see PCP on last Friday as she had her appt with MD. She states that  She "casually mentioned it during her appt as she sees the same PCP that patient needed another GI MD referral". Spouse inquiring about why office has not called her back yet since she mentioned this Friday. Advised spouse that today was just Monday and office may not have had a chance to make referral.Attempted to explain referral process and that it can take several days but she was not happy with response. When she found out that there was nothing RN CM could do to get patient a colonoscopy ASAP she proceeded to end the call. She voiced that she would just call PCP office.      Plan: RN CM will close referral at this time.    Enzo Montgomery, RN,BSN,CCM [facility] Telephonic Care Management Coordinator Direct Phone: [phone] Toll Free:  [phone] Fax: [phone]

## 2020-10-08 NOTE — Telephone Encounter (Signed)
Hopefully this makes sense: MR. Javier [name] already has an appt with his GI doctor that he has been seeing, lets make that part CLEAR. & he has an appt coming up to have an COLONOSCOPY. But the patient's wife is not happy with the appt date. So she had her insurance company CALL & to reply that inform & Javier [name] from [facility] along with patient on Javier [name] would like to schedule an appt with her/HIS PCP to get a referral to A NEW GI dr in order to get an POSS sooner appt.   I explained to the [place] that if MRS.Javier [name] wanted to she could have called MR. Javier [name]'S PCP and scheduled an in office visit for a new GI DR referral.   The issues is that Mrs. Javier [name] is not happy with the date that GI scheduled with them & does not want to wait & would like a referral to be seen faster.   SENT MESSAGE TO Javier [name] to make aware of this, as well just in case you want to put in a referral for new GI.

## 2020-10-09 ENCOUNTER — Ambulatory Visit (INDEPENDENT_AMBULATORY_CARE_PROVIDER_SITE_OTHER): Payer: Medicare HMO | Admitting: Adult Health

## 2020-10-09 ENCOUNTER — Encounter: Payer: Self-pay | Admitting: Adult Health

## 2020-10-09 ENCOUNTER — Other Ambulatory Visit: Payer: Self-pay

## 2020-10-09 VITALS — BP 110/68 | HR 78 | Temp 97.7°F | Wt 177.0 lb

## 2020-10-09 DIAGNOSIS — F419 Anxiety disorder, unspecified: Secondary | ICD-10-CM

## 2020-10-09 DIAGNOSIS — R441 Visual hallucinations: Secondary | ICD-10-CM

## 2020-10-09 DIAGNOSIS — R413 Other amnesia: Secondary | ICD-10-CM

## 2020-10-09 MED ORDER — SERTRALINE HCL 50 MG PO TABS: 50.0000 mg | ORAL_TABLET | Freq: Every day | ORAL | 0 refills | Status: DC

## 2020-10-09 NOTE — Patient Instructions (Signed)
Start 1/2 tab daily for 2 weeks, then increase to whole tab daily    NEW GUIDELINES FOR BENOZOS  New guidelines suggest the benzodiazepines are best short term, with prolonged use they lead to physical and psychological dependence. In addition, evidence suggest that for insomnia the effectiveness wanes in 4 weeks and the risks out weight their benefits. Use of these agents have been associated with dementia, falls, motor vehicle accidents and physical addiction. Decreasing these medication have been proven to show improvements in cognition, alertness, decrease of falls and daytime sedation.   We will start a slow taper, symptoms of withdrawal include, insomnia, anxiety, irritability, sweating and stomach or intestinal symptoms like diarrhea or nausea.     Sertraline Tablets What is this medicine? SERTRALINE (SER tra leen) is used to treat depression. It may also be used to treat obsessive compulsive disorder, panic disorder, post-trauma stress disorder, premenstrual dysphoric disorder (PMDD) or social anxiety. This medicine may be used for other purposes; ask your health care provider or pharmacist if you have questions. COMMON BRAND NAME(S): Zoloft What should I tell my health care provider before I take this medicine? They need to know if you have any of these conditions:  bleeding disorders  bipolar disorder or a family history of bipolar disorder  glaucoma  heart disease  high blood pressure  history of irregular heartbeat  history of low levels of calcium, magnesium, or potassium in the blood  if you often drink alcohol  liver disease  receiving electroconvulsive therapy  seizures  suicidal thoughts, plans, or attempt; a previous suicide attempt by you or a family member  take medicines that treat or prevent blood clots  thyroid disease  an unusual or allergic reaction to sertraline, other medicines, foods, dyes, or preservatives  pregnant or trying to get  pregnant  breast-feeding How should I use this medicine? Take this medicine by mouth with a glass of water. Follow the directions on the prescription label. You can take it with or without food. Take your medicine at regular intervals. Do not take your medicine more often than directed. Do not stop taking this medicine suddenly except upon the advice of your doctor. Stopping this medicine too quickly may cause serious side effects or your condition may worsen. A special MedGuide will be given to you by the pharmacist with each prescription and refill. Be sure to read this information carefully each time. Talk to your pediatrician regarding the use of this medicine in children. While this drug may be prescribed for children as young as 7 years for selected conditions, precautions do apply. Overdosage: If you think you have taken too much of this medicine contact a poison control center or emergency room at once. NOTE: This medicine is only for you. Do not share this medicine with others. What if I miss a dose? If you miss a dose, take it as soon as you can. If it is almost time for your next dose, take only that dose. Do not take double or extra doses. What may interact with this medicine? Do not take this medicine with any of the following medications:  cisapride  dronedarone  linezolid  MAOIs like Carbex, Eldepryl, Marplan, Nardil, and Parnate  methylene blue (injected into a vein)  pimozide  thioridazine This medicine may also interact with the following medications:  alcohol  amphetamines  aspirin and aspirin-like medicines  certain medicines for depression, anxiety, or psychotic disturbances  certain medicines for fungal infections like ketoconazole, fluconazole, posaconazole, and  itraconazole  certain medicines for irregular heart beat like flecainide, quinidine, propafenone  certain medicines for migraine headaches like almotriptan, eletriptan, frovatriptan,  naratriptan, rizatriptan, sumatriptan, zolmitriptan  certain medicines for sleep  certain medicines for seizures like carbamazepine, valproic acid, phenytoin  certain medicines that treat or prevent blood clots like warfarin, enoxaparin, dalteparin  cimetidine  digoxin  diuretics  fentanyl  isoniazid  lithium  NSAIDs, medicines for pain and inflammation, like ibuprofen or naproxen  other medicines that prolong the QT interval (cause an abnormal heart rhythm) like dofetilide  rasagiline  safinamide  supplements like St. John's wort, kava kava, valerian  tolbutamide  tramadol  tryptophan This list may not describe all possible interactions. Give your health care provider a list of all the medicines, herbs, non-prescription drugs, or dietary supplements you use. Also tell them if you smoke, drink alcohol, or use illegal drugs. Some items may interact with your medicine. What should I watch for while using this medicine? Tell your doctor if your symptoms do not get better or if they get worse. Visit your doctor or health care professional for regular checks on your progress. Because it may take several weeks to see the full effects of this medicine, it is important to continue your treatment as prescribed by your doctor. Patients and their families should watch out for new or worsening thoughts of suicide or depression. Also watch out for sudden changes in feelings such as feeling anxious, agitated, panicky, irritable, hostile, aggressive, impulsive, severely restless, overly excited and hyperactive, or not being able to sleep. If this happens, especially at the beginning of treatment or after a change in dose, call your health care professional. Dennis Bast may get drowsy or dizzy. Do not drive, use machinery, or do anything that needs mental alertness until you know how this medicine affects you. Do not stand or sit up quickly, especially if you are an older patient. This reduces the  risk of dizzy or fainting spells. Alcohol may interfere with the effect of this medicine. Avoid alcoholic drinks. Your mouth may get dry. Chewing sugarless gum or sucking hard candy, and drinking plenty of water may help. Contact your doctor if the problem does not go away or is severe. What side effects may I notice from receiving this medicine? Side effects that you should report to your doctor or health care professional as soon as possible:  allergic reactions like skin rash, itching or hives, swelling of the face, lips, or tongue  anxious  black, tarry stools  changes in vision  confusion  elevated mood, decreased need for sleep, racing thoughts, impulsive behavior  eye pain  fast, irregular heartbeat  feeling faint or lightheaded, falls  feeling agitated, angry, or irritable  hallucination, loss of contact with reality  loss of balance or coordination  loss of memory  painful or prolonged erections  restlessness, pacing, inability to keep still  seizures  stiff muscles  suicidal thoughts or other mood changes  trouble sleeping  unusual bleeding or bruising  unusually weak or tired  vomiting Side effects that usually do not require medical attention (report to your doctor or health care professional if they continue or are bothersome):  change in appetite or weight  change in sex drive or performance  diarrhea  increased sweating  indigestion, nausea  tremors This list may not describe all possible side effects. Call your doctor for medical advice about side effects. You may report side effects to FDA at 1-800-FDA-1088. Where should I keep my  medicine? Keep out of the reach of children. Store at room temperature between 15 and 30 degrees C (59 and 86 degrees F). Throw away any unused medicine after the expiration date. NOTE: This sheet is a summary. It may not cover all possible information. If you have questions about this medicine, talk to your  doctor, pharmacist, or health care provider.  2021 Elsevier/Gold Standard (2020-07-26 09:18:23)

## 2020-10-09 NOTE — Progress Notes (Signed)
Assessment and Plan:  Javier [name] was seen today for referral.  Diagnoses and all orders for this visit:  Memory changes Visual hallucinations Basic lab workups recently have been normal, normal Hep C, iron Borderline B12 recommended supplement Discussed RPR, ANA, CRP, neuro imaging- but also with possible visual hallucinations Had been referred to neuro but never completed, after discussion with patient and wife will defer workup to them, will refer back -     Ambulatory referral to Neurology  Anxiety Start new medication as prescribed - start low dose - 1/2 tab only for 2-4 weeks then increase to whole tab if tolerating, goal to slowly reduce xanax use to avoid daily Stress management techniques discussed, increase water, good sleep hygiene discussed, increase exercise, and increase veggies.  Follow up 1 month, call the office if any new AE's from medications and we will switch them -     sertraline (ZOLOFT) 50 MG tablet; Take 1 tablet (50 mg total) by mouth daily.   Further disposition pending results of labs. Discussed med's effects and SE's.   Over 30 minutes of exam, counseling, chart review, and critical decision making was performed.   Future Appointments  Date Time Provider [place]  10/19/2020  8:45 AM [facility] None  10/19/2020  9:20 AM Brunetta Genera, MD [facility] None  12/13/2020 11:00 AM Unk Pinto, MD [facility] None    ------------------------------------------------------------------------------------------------------------------   HPI BP 110/68   Pulse 78   Temp 97.7 F (36.5 C)   Wt 177 lb (80.3 kg)   SpO2 97%   BMI 24.69 kg/m   79 y.o.male with hx of leukemia in remission presents for evaluation of memory changes and anxiety. Was just seen in ED last night for burning abdominal pain, has had multiple unremarkable workups by specilialist but per wife was recommended referral to psych for anxiety. He has upcoming  appointment with GI specialist. He currently takes xanax PRN (taking daily at night, PRN during the day), have never been on daily agent.   After extended discussion today, wife notes accelerated memory changes over last 2-3 months; he had reported progressive memory changes in 04/2019 over the previous year, "slow", has had normal normal organ functions, normal iron, low normal B12 (have recommended supplement), declined further lab at this office but referred for further evaluation to Dr. Leta Baptist but apparently patient didn't want to see another specialist at that time due to mild sx and pandemic. Today wife reports memory seems to be declining, unable to navigate as he was previously able to (can't find the route to highway, will forget if he took a shower yesterday). He has commented to wife about seeing things out of the corner of his eye.   Reports hx of dementia in mother, unsure if Alzheimer's  Lab Results  Component Value Date   WBC 5.0 09/10/2020   HGB 14.0 09/10/2020   HCT 42.9 09/10/2020   MCV 92.7 09/10/2020   PLT 142 09/10/2020   Lab Results  Component Value Date   TSH 0.83 09/10/2020   Lab Results  Component Value Date   VITAMINB12 403 04/06/2020   Lab Results  Component Value Date   IRON 102 04/06/2020   TIBC 308 04/06/2020   FERRITIN 45 04/06/2020   Lab Results  Component Value Date   HEPBCAB Negative 02/03/2019   HEPCAB NON-REACTIVE 03/07/2020    Past Medical History:  Diagnosis Date  . Diverticulosis   . Hyperlipidemia   . Hypertension   . Prediabetes   .  Vitamin D deficiency      Allergies  Allergen Reactions  . Penicillins Shortness Of Breath, Swelling and Other (See Comments)    Unknown allergic reaction as a teenager  . Ppd [Tuberculin Purified Protein Derivative]     Positive test in 2004    Current Outpatient Medications on File Prior to Visit  Medication Sig  . ALPRAZolam (XANAX) 0.5 MG tablet TAKE 1/2 -1 TABLET BY MOUTH 2-3 TIMES A DAY  ONLY IF NEEDED FOR ANXIETY ATTACK AND LIMIT TO 5 DAYS A WEEK TO AVOID ADDICTION  . Alum & Mag Hydroxide-Simeth (MYLANTA PO) Take by mouth as needed.  Marland Kitchen aspirin EC 81 MG tablet Take 81 mg by mouth daily.  . Cholecalciferol (VITAMIN D3) 5000 units TABS Take 5,000 Units by mouth daily.  Marland Kitchen losartan-hydrochlorothiazide (HYZAAR) 100-25 MG tablet Take      1 tablet     Daily      for BP & Fluid Retention /Ankle Swelling         (TAKE 1 TABLET BY MOUTH ONCE DAILY FOR BLOOD PRESSURE & FLUID RETENTION/ANKLE SWELLING)  . triamcinolone (NASACORT) 55 MCG/ACT AERO nasal inhaler Place 2 sprays into the nose at bedtime. (Patient taking differently: Place 2 sprays into the nose as needed.)  . venetoclax (VENCLEXTA) 100 MG TABS Take 200 mg by mouth daily. Take with food and water.   No current facility-administered medications on file prior to visit.    ROS: all negative except above.   Physical Exam:  BP 110/68   Pulse 78   Temp 97.7 F (36.5 C)   Wt 177 lb (80.3 kg)   SpO2 97%   BMI 24.69 kg/m   General Appearance: Well nourished, in no apparent distress. Eyes: PERRLA, EOMs, conjunctiva no swelling or erythema Sinuses: No Frontal/maxillary tenderness ENT/Mouth: Ext aud canals clear, TMs without erythema, bulging. No erythema, swelling, or exudate on post pharynx.  Tonsils not swollen or erythematous. Hearing normal.  Neck: Supple, thyroid normal.  Respiratory: Respiratory effort normal, BS equal bilaterally without rales, rhonchi, wheezing or stridor.  Cardio: RRR with no MRGs. Brisk peripheral pulses without edema.  Abdomen: Soft, + BS.  Non tender, no guarding, rebound, hernias, masses. Lymphatics: Non tender without lymphadenopathy.  Musculoskeletal: Full ROM, 5/5 strength, mildly unsteady gait Skin: Warm, dry without rashes, lesions, ecchymosis.  Neuro: Cranial nerves intact. Normal muscle tone, no cerebellar symptoms. Sensation intact. Slow recall. Poor short term memory.  Psych: Awake and  oriented X 3, normal affect, Insight and Judgment appropriate.      Izora Ribas, NP 10:25 AM [facility]

## 2020-10-16 ENCOUNTER — Emergency Department (HOSPITAL_COMMUNITY)
Admission: EM | Admit: 2020-10-16 | Discharge: 2020-10-17 | Disposition: A | Discharge status: Home / Self Care | Payer: Medicare HMO | Attending: Emergency Medicine | Admitting: Emergency Medicine

## 2020-10-16 ENCOUNTER — Encounter (HOSPITAL_COMMUNITY): Payer: Self-pay

## 2020-10-16 ENCOUNTER — Other Ambulatory Visit: Payer: Self-pay

## 2020-10-16 DIAGNOSIS — Z87891 Personal history of nicotine dependence: Secondary | ICD-10-CM | POA: Diagnosis not present

## 2020-10-16 DIAGNOSIS — R142 Eructation: Secondary | ICD-10-CM | POA: Diagnosis not present

## 2020-10-16 DIAGNOSIS — I10 Essential (primary) hypertension: Secondary | ICD-10-CM | POA: Insufficient documentation

## 2020-10-16 DIAGNOSIS — K219 Gastro-esophageal reflux disease without esophagitis: Secondary | ICD-10-CM | POA: Diagnosis not present

## 2020-10-16 DIAGNOSIS — Z79899 Other long term (current) drug therapy: Secondary | ICD-10-CM | POA: Diagnosis not present

## 2020-10-16 DIAGNOSIS — Z7982 Long term (current) use of aspirin: Secondary | ICD-10-CM | POA: Diagnosis not present

## 2020-10-16 LAB — LIPASE, BLOOD: Lipase: 28 U/L (ref 11–51)

## 2020-10-16 LAB — COMPREHENSIVE METABOLIC PANEL
ALT: 15 U/L (ref 0–44)
AST: 19 U/L (ref 15–41)
Albumin: 3.8 g/dL (ref 3.5–5.0)
Alkaline Phosphatase: 54 U/L (ref 38–126)
Anion gap: 12 (ref 5–15)
BUN: 11 mg/dL (ref 8–23)
CO2: 27 mmol/L (ref 22–32)
Calcium: 10 mg/dL (ref 8.9–10.3)
Chloride: 97 mmol/L — ABNORMAL LOW (ref 98–111)
Creatinine, Ser: 1.14 mg/dL (ref 0.61–1.24)
GFR, Estimated: >60 mL/min (ref >60)
Glucose, Bld: 102 mg/dL — ABNORMAL HIGH (ref 70–99)
Potassium: 3.4 mmol/L — ABNORMAL LOW (ref 3.5–5.1)
Sodium: 136 mmol/L (ref 135–145)
Total Bilirubin: 1.2 mg/dL (ref 0.3–1.2)
Total Protein: 6.4 g/dL — ABNORMAL LOW (ref 6.5–8.1)

## 2020-10-16 LAB — CBC
HCT: 41.4 % (ref 39.0–52.0)
Hemoglobin: 13.7 g/dL (ref 13.0–17.0)
MCH: 30.4 pg (ref 26.0–34.0)
MCHC: 33.1 g/dL (ref 30.0–36.0)
MCV: 92 fL (ref 80.0–100.0)
Platelets: 166 10*3/uL (ref 150–400)
RBC: 4.5 MIL/uL (ref 4.22–5.81)
RDW: 12.6 % (ref 11.5–15.5)
WBC: 6.5 10*3/uL (ref 4.0–10.5)
nRBC: 0 % (ref 0.0–0.2)

## 2020-10-16 NOTE — ED Triage Notes (Signed)
Patient complains of months of feeling full and burping a lot. No nausea, occasional emesis. Denies pain

## 2020-10-17 DIAGNOSIS — R142 Eructation: Secondary | ICD-10-CM | POA: Diagnosis present

## 2020-10-17 NOTE — ED Provider Notes (Signed)
[facility] EMERGENCY DEPARTMENT Provider Note   CSN: [id number] Arrival date & time: 10/16/20  [id number]     History No chief complaint on file.   Javier [name] is a 80 y.o. male with a past medical history of GERD, CLL, anxiety, memory changes, hypertension, hyperlipidemia, gastric AVM who presents today for multiple months and feeling full and burping a lot. History is provided by patient, his wife, and chart review.  Patient's wife reports that he was "having one of his episodes" where he feels like he is burning and will go outside in a T-shirt and underwear to sit in the cold air.  This has been ongoing for over months to years.  She states that he felt like he needed to come to the emergency room and requested this.  This episode is consistent with his previous ongoing long-term episodes. Additionally his wife reports that he has very foul-smelling burps and she doesn't sleep well at night as patient is restless and constantly wanting to go outside.    She reports he has had poor appetite over the past many months.  He has merrily been eating soft foods like yogurt recently.  Currently patient denies any symptoms.  He was seen for the same on 10/08/2020 at [facility]. At that point it was noted that his burning feeling and going outside has been going on for over 2 years. Per note by Dr. Melford Aase MD from that visit   "Patient comes in with several chronic complaints. According to patient and his wife, he has these episodes where he feels like he has burning up and then other times where he gets very cold. His wife states that he will go outside in the freezing cold weather during episodes where he feels hot. She states that this has been going on for over 2 years. He initially had stated that he has intermittent vomiting. However, it does not sound that he is actually having true emesis. He spits up some clear phlegm at times. He denies abdominal pain or diarrhea.  His last bowel movement was today which was normal. However, he does take laxatives often for chronic constipation. Patient does admit to chronic anxiety for which she take Xanax. His wife states that he often does not sleep because he sits up worrying about several things. Patient states that he is mostly worried about what is going on in the world. He does have a history of cancer but is in remission. It sounds like he may be somewhat worried about this as well. Patient denies chest pain or abdominal pain."   At that visit he had negative troponin, normal lipase, CMP normal except for minimally high calcium, and reassuring CBC. According to their documentation it appears that anxiety was suspected as the underlying cause.  Patient saw his PCP the next day who gave him a referral to neurology and started patient on Zoloft for anxiety.  Additionally on 09/19/2020 patient had a CT abdomen pelvis for these same symptoms that he is continuing to experience.  There was no cause of symptoms found, no evidence of obstruction or mass.     HPI     Past Medical History:  Diagnosis Date  . Diverticulosis   . Hyperlipidemia   . Hypertension   . Prediabetes   . Vitamin D deficiency     Patient Active Problem List   Diagnosis Date Noted  . Burping 10/17/2020  . BMI 26.0-26.9,adult 03/06/2020  . Gastric AVM 03/06/2020  .  History of colon polyps 03/06/2020  . Aortic atherosclerosis ([place]) 08/23/2019  . Memory changes 05/25/2019  . Counseling regarding advance care planning and goals of care 01/24/2019  . Anxiety 02/08/2018  . CLL (chronic lymphocytic leukemia) ([place]) 11/01/2016  . Encounter for Medicare annual wellness exam 09/11/2015  . GERD  02/09/2015  . Medication management 11/02/2013  . Hyperlipidemia   . Hypertension   . Abnormal glucose   . Vitamin D deficiency   . Diverticulosis     Past Surgical History:  Procedure Laterality Date  . NO PAST SURGERIES         Family History   Problem Relation Age of Onset  . Stroke Mother   . Early death Father        Farm/tractor accident  . Diabetes Brother   . Cirrhosis Brother   . Colon cancer Paternal Grandfather     Social History   Tobacco Use  . Smoking status: Former Smoker    Types: Cigarettes    Quit date: 01/17/2003    Years since quitting: 17.7  . Smokeless tobacco: Never Used  Substance Use Topics  . Alcohol use: Yes    Comment: very rare  . Drug use: No    Home Medications Prior to Admission medications   Medication Sig Start Date End Date Taking? Authorizing Provider  ALPRAZolam (XANAX) 0.5 MG tablet TAKE 1/2 -1 TABLET BY MOUTH 2-3 TIMES A DAY ONLY IF NEEDED FOR ANXIETY ATTACK AND LIMIT TO 5 DAYS A WEEK TO AVOID ADDICTION 03/29/20   Vicie Mutters R, PA-C  Alum & Mag Hydroxide-Simeth (MYLANTA PO) Take by mouth as needed.    [provider]  aspirin EC 81 MG tablet Take 81 mg by mouth daily.    [provider]  Cholecalciferol (VITAMIN D3) 5000 units TABS Take 5,000 Units by mouth daily.    [provider]  losartan-hydrochlorothiazide (HYZAAR) 100-25 MG tablet Take      1 tablet     Daily      for BP & Fluid Retention /Ankle Swelling         (TAKE 1 TABLET BY MOUTH ONCE DAILY FOR BLOOD PRESSURE & FLUID RETENTION/ANKLE SWELLING) 09/03/20   Unk Pinto, MD  sertraline (ZOLOFT) 50 MG tablet Take 1 tablet (50 mg total) by mouth daily. 10/09/20 10/09/21  Liane Comber, NP  triamcinolone (NASACORT) 55 MCG/ACT AERO nasal inhaler Place 2 sprays into the nose at bedtime. Patient taking differently: Place 2 sprays into the nose as needed. 01/21/19 05/11/20  Vladimir Crofts, PA-C  venetoclax (VENCLEXTA) 100 MG TABS Take 200 mg by mouth daily. Take with food and water. 02/28/20   Brunetta Genera, MD    Allergies    Penicillins and Ppd [tuberculin purified protein derivative]  Review of Systems   Review of Systems  Constitutional: Positive for appetite change. Negative for  chills and fever.       Feelings of burning at night  HENT: Negative for congestion.   Eyes: Negative for visual disturbance.  Respiratory: Negative for cough and shortness of breath.   Cardiovascular: Negative for chest pain.  Gastrointestinal: Positive for abdominal pain (Resolved, unchanged). Negative for nausea and vomiting.  Genitourinary: Negative for dysuria.  Musculoskeletal: Negative for back pain and neck pain.  Skin: Negative for color change.  Neurological: Negative for weakness and headaches.  Psychiatric/Behavioral: Positive for hallucinations (Occasional, per PCP note. ) and sleep disturbance. The patient is nervous/anxious.   All other systems reviewed and are  negative.   Physical Exam Updated Vital Signs BP (!) 162/66   Pulse 66   Temp (!) 97.5 F (36.4 C) (Oral)   Resp 18   Ht 5\' 11"  (1.803 m)   Wt 79.4 kg   SpO2 98%   BMI 24.41 kg/m   Physical Exam Vitals and nursing note reviewed.  Constitutional:      General: He is not in acute distress.    Appearance: He is not diaphoretic.  HENT:     Head: Normocephalic and atraumatic.  Eyes:     General: No scleral icterus.       Right eye: No discharge.        Left eye: No discharge.     Conjunctiva/sclera: Conjunctivae normal.  Cardiovascular:     Rate and Rhythm: Normal rate and regular rhythm.     Pulses: Normal pulses.     Heart sounds: Normal heart sounds.  Pulmonary:     Effort: Pulmonary effort is normal. No respiratory distress.     Breath sounds: No stridor.  Abdominal:     General: There is no distension.     Tenderness: There is no abdominal tenderness. There is no guarding.  Musculoskeletal:        General: No deformity.     Cervical back: Normal range of motion and neck supple.     Right lower leg: No edema.     Left lower leg: No edema.  Skin:    General: Skin is warm and dry.  Neurological:     General: No focal deficit present.     Mental Status: He is alert.     Motor: No abnormal  muscle tone.     Comments: Baseline hard of hearing. Speech is not slurred. Spontaneous movement of all four extremities. At baseline per wife at bedside.  Psychiatric:        Mood and Affect: Mood normal.        Behavior: Behavior normal.     ED Results / Procedures / Treatments   Labs (all labs ordered are listed, but only abnormal results are displayed) Labs Reviewed  COMPREHENSIVE METABOLIC PANEL - Abnormal; Notable for the following components:      Result Value   Potassium 3.4 (*)    Chloride 97 (*)    Glucose, Bld 102 (*)    Total Protein 6.4 (*)    All other components within normal limits  LIPASE, BLOOD  CBC  URINALYSIS, ROUTINE W REFLEX MICROSCOPIC    EKG None  Radiology No results found.  Procedures Procedures (including critical care time)  Medications Ordered in ED Medications - No data to display  ED Course  I have reviewed the triage vital signs and the nursing notes.  Pertinent labs & imaging results that were available during my care of the patient were reviewed by me and considered in my medical decision making (see chart for details).    MDM Rules/Calculators/A&P                          Patient is a 80 year old man who presents today for evaluation of continuing and ongoing belching and feeling like he has episodes where his stomach is burning. This has been reportedly going on for at least as long as 2 years. It appears that in the meantime patient has had some memory difficulties and has become more anxious. He has been seen for these same symptoms on the 10th of this  month at outside hospital, those records are reviewed and he had normal troponin and reassuring lab work. He followed up with PCP the next day and was started on additional anxiety medication with neurology referral for memory changes and possible anxiety.  Additionally he had a CT scan previously obtained within the past month without evidence of mass or cause for his  symptoms.  Here patient's CBC and lipase are unremarkable. His CMP shows minimal hypokalemia which I suspect is secondary to his overall poor p.o. intake per report. He is able to tolerate p.o. intake and states that he is eating yogurt and other things occasionally however it seems like overall he more of has poor appetite.  I discussed at length with the patient and his wife that patient has had extensive work-ups over the past month including multiple rounds of blood work, CT abdomen pelvis and been seen by PCP without cause for the symptoms found. It appears that patient's anxiety is worsened during this time which may be contributing to why the frequency of medical evaluations for this has increased as this has reportedly been going on for up to as long as 2 years.  Here his vitals are reassuring however he is slightly hypertensive. He does not have any symptoms at the time my exam and has tolerated p.o. I recommended continue outpatient follow-up in addition to PCP follow-up. I discussed allowing his new medication changes time to reach full effect. Patient and his wife state their understanding and are agreeable with this.  I doubt a emergent or acutely life-threatening cause of his symptoms and they have been ongoing for up to 2 years and it appears the primary change has been in his anxiety possibly related to his memory difficulties.  Return precautions were discussed with patient/wife who states their understanding.  At the time of discharge patient/wife denied any unaddressed complaints or concerns.  Patient/wife are agreeable for discharge home.  Note: Portions of this report may have been transcribed using voice recognition software. Every effort was made to ensure accuracy; however, inadvertent computerized transcription errors may be present   Final Clinical Impression(s) / ED Diagnoses Final diagnoses:  Burping  Gastroesophageal reflux disease, unspecified whether esophagitis  present    Rx / DC Orders ED Discharge Orders    None       Lorin Glass, PA-C 10/17/20 [id number]    Veryl Speak, MD 10/17/20 [id number]

## 2020-10-17 NOTE — Discharge Instructions (Addendum)
Please follow-up with your primary care doctor and your GI doctor. Please make sure you are staying well-hydrated. I would recommend that you sleep with the head of the bed elevated at least 15 degrees.  In addition please try to not eat anything for 2 hours prior to bed.  Please try to eat small frequent meals even if you do not feel hungry.    If you develop fevers, have recurrent vomiting, do not have a bowel movement for extended period of time or have other concerns please seek additional medical care and evaluation.

## 2020-10-18 NOTE — Progress Notes (Signed)
HEMATOLOGY/ONCOLOGY CLINIC NOTE  Date of Service: 10/18/20   Patient Care Team: Unk Pinto, MD as PCP - General (Internal Medicine) Inda Castle, MD (Inactive) as Consulting Physician (Gastroenterology) Melissa Noon, [place] as Referring Physician (Optometry) Danis, Kirke Corin, MD as Consulting Physician (Gastroenterology) Brunetta Genera, MD as Consulting Physician (Hematology)  CHIEF COMPLAINTS/PURPOSE OF CONSULTATION:  F/u for CD5 neg CLL   HISTORY OF PRESENTING ILLNESS:   KAZUMA ELENA is a wonderful 80 y.o. male who has been referred to [place] by Dr .Unk Pinto, MD for evaluation and management of lymphocytosis concerning for a lymphoproliferative process.  Patient has a h/o HTN, HLD, Prediabetes who notes that he had significant abdominal discomfort with significant dyspeptic symptoms around aug 2017 and had about 20-25lbs weight loss and had an extensive GI workup including CT abd/pelvis 05/25/2016 - WNL, EGD which showed chronic gastritis (helicobacter pylori +ve) and colonscopy with some polyps. Capsule endoscopy was not approved by insurance. Patients symptoms improved after treatment of H.pylori and he notes that he has gained back about 6-7 lbs and is eating better.  Patients labs were noted to show some leucocytosis/Lymphocytosis for which he was referred to [place] for further evaluation.  Patient notes some nightsweats and weight loss (as noted above, possible alternative explanation). No fevers/chills. Has not noted any overt enlarged LN and CT abd in 04/2016 showed no hepatomegaly or splenomegaly and no abd/RP LNadenopathy.  Patient notes no other acute focal symptoms.   INTERVAL HISTORY: Mr Hippert is here for management and evaluation of his CLL. We are joined today by his wife. The patient's last visit with [place] was on 06/22/2020. The pt reports that he is doing well overall.  The pt reports that he has been experiencing severe anxiety that has caused  decreased appetite and significant weight loss. He started Zoloft two weeks ago as prescribed by his PCP. His wife notes this is a familial commonality of anxiety, as the pt's sister is dealing with it too. The pt has an appointment with a Behavioral Specialist in March 2022.  The pt notes that he has received both his COVID vaccines and booster.  Lab results today, 10/19/2020 of CBC w/diff and CMP is as follows: all values are WNL except for potassium at 3.3, Glucose at 127.  On review of systems, pt reports severe anxiety, decreased appetite, weight loss and denies lumps and bumps, fevers, chills, night sweats, new bone pain, abdominal pain, back pain, leg swelling, and any other symptoms.  MEDICAL HISTORY:  Past Medical History:  Diagnosis Date  . Diverticulosis   . Hyperlipidemia   . Hypertension   . Prediabetes   . Vitamin D deficiency   Helicobacter pylori +ve   SURGICAL HISTORY: Past Surgical History:  Procedure Laterality Date  . NO PAST SURGERIES      SOCIAL HISTORY: Social History   Socioeconomic History  . Marital status: Married    Spouse name: Not on file  . Number of children: 5  . Years of education: Not on file  . Highest education level: Not on file  Occupational History  . Occupation: retired  Tobacco Use  . Smoking status: Former Smoker    Types: Cigarettes    Quit date: 01/17/2003    Years since quitting: 17.7  . Smokeless tobacco: Never Used  Substance and Sexual Activity  . Alcohol use: Yes    Comment: very rare  . Drug use: No  . Sexual activity: Not on file  Other Topics Concern  . Not on file  Social History Narrative  . Not on file   Social Determinants of Health   Financial Resource Strain: Not on file  Food Insecurity: Not on file  Transportation Needs: Not on file  Physical Activity: Not on file  Stress: Not on file  Social Connections: Not on file  Intimate Partner Violence: Not on file  Quit smoking >10 yrs ago, previously  1.5Packs per week. Started smoking in his teens. Retired. Worked as a Chief Financial Officer.   FAMILY HISTORY: Family History  Problem Relation Age of Onset  . Stroke Mother   . Early death Father        Farm/tractor accident  . Diabetes Brother   . Cirrhosis Brother   . Colon cancer Paternal Grandfather     ALLERGIES:  is allergic to penicillins and ppd [tuberculin purified protein derivative].  MEDICATIONS:  Current Outpatient Medications  Medication Sig Dispense Refill  . ALPRAZolam (XANAX) 0.5 MG tablet TAKE 1/2 -1 TABLET BY MOUTH 2-3 TIMES A DAY ONLY IF NEEDED FOR ANXIETY ATTACK AND LIMIT TO 5 DAYS A WEEK TO AVOID ADDICTION 90 tablet 0  . Alum & Mag Hydroxide-Simeth (MYLANTA PO) Take by mouth as needed.    Marland Kitchen aspirin EC 81 MG tablet Take 81 mg by mouth daily.    . Cholecalciferol (VITAMIN D3) 5000 units TABS Take 5,000 Units by mouth daily.    Marland Kitchen losartan-hydrochlorothiazide (HYZAAR) 100-25 MG tablet Take      1 tablet     Daily      for BP & Fluid Retention /Ankle Swelling         (TAKE 1 TABLET BY MOUTH ONCE DAILY FOR BLOOD PRESSURE & FLUID RETENTION/ANKLE SWELLING) 90 tablet 0  . sertraline (ZOLOFT) 50 MG tablet Take 1 tablet (50 mg total) by mouth daily. 90 tablet 0  . triamcinolone (NASACORT) 55 MCG/ACT AERO nasal inhaler Place 2 sprays into the nose at bedtime. (Patient taking differently: Place 2 sprays into the nose as needed.) 1 Inhaler 3  . venetoclax (VENCLEXTA) 100 MG TABS Take 200 mg by mouth daily. Take with food and water. 60 tablet 3   No current facility-administered medications for this visit.    REVIEW OF SYSTEMS:   10 Point review of Systems was done is negative except as noted above.  PHYSICAL EXAMINATION: ECOG PERFORMANCE STATUS: 1 - Symptomatic but completely ambulatory  Vitals:   10/19/20 0921  BP: 117/71  Pulse: 75  Resp: 18  Temp: (!) 97.5 F (36.4 C)  SpO2: 98%   Filed Weights   10/19/20 0921  Weight: 173 lb 1.6 oz (78.5 kg)   .Body mass  index is 24.14 kg/m.   Exam was given in a chair.  GENERAL:alert, in no acute distress and comfortable SKIN: no acute rashes, no significant lesions EYES: conjunctiva are pink and non-injected, sclera anicteric OROPHARYNX: MMM, no exudates, no oropharyngeal erythema or ulceration NECK: supple, no JVD LYMPH:  no palpable lymphadenopathy in the cervical, axillary or inguinal regions LUNGS: clear to auscultation b/l with normal respiratory effort HEART: regular rate & rhythm ABDOMEN:  normoactive bowel sounds , non tender, not distended. Extremity: no pedal edema PSYCH: alert & oriented x 3 with fluent speech NEURO: no focal motor/sensory deficits    LABORATORY DATA:  I have reviewed the data as listed  . CBC Latest Ref Rng & Units 10/19/2020 10/16/2020 09/10/2020  WBC 4.0 - 10.5 K/uL 6.0 6.5 5.0  Hemoglobin 13.0 -  17.0 g/dL 13.8 13.7 14.0  Hematocrit 39.0 - 52.0 % 40.5 41.4 42.9  Platelets 150 - 400 K/uL 172 166 142   . CBC    Component Value Date/Time   WBC 6.5 10/16/2020 1731   RBC 4.50 10/16/2020 1731   HGB 13.7 10/16/2020 1731   HGB 12.2 (L) 01/03/2019 1509   HGB 14.5 10/21/2016 1324   HCT 41.4 10/16/2020 1731   HCT 43.8 10/21/2016 1324   PLT 166 10/16/2020 1731   PLT 152 01/03/2019 1509   PLT 157 10/21/2016 1324   MCV 92.0 10/16/2020 1731   MCV 90.9 10/21/2016 1324   MCH 30.4 10/16/2020 1731   MCHC 33.1 10/16/2020 1731   RDW 12.6 10/16/2020 1731   RDW 13.3 10/21/2016 1324   LYMPHSABS 1,235 09/10/2020 1152   LYMPHSABS 17.6 (H) 10/21/2016 1324   MONOABS 0.6 06/22/2020 0827   MONOABS 0.6 10/21/2016 1324   EOSABS 40 09/10/2020 1152   EOSABS 0.0 10/21/2016 1324   BASOSABS 20 09/10/2020 1152   BASOSABS 0.1 10/21/2016 1324     . CMP Latest Ref Rng & Units 10/19/2020 10/16/2020 09/10/2020  Glucose 70 - 99 mg/dL 127(H) 102(H) 81  BUN 8 - 23 mg/dL '18 11 17  ' Creatinine 0.61 - 1.24 mg/dL 1.19 1.14 1.09  Sodium 135 - 145 mmol/L 141 136 143  Potassium 3.5 - 5.1  mmol/L 3.3(L) 3.4(L) 4.0  Chloride 98 - 111 mmol/L 100 97(L) 106  CO2 22 - 32 mmol/L '29 27 30  ' Calcium 8.9 - 10.3 mg/dL 9.9 10.0 10.1  Total Protein 6.5 - 8.1 g/dL 6.9 6.4(L) 6.4  Total Bilirubin 0.3 - 1.2 mg/dL 0.8 1.2 0.7  Alkaline Phos 38 - 126 U/L 60 54 -  AST 15 - 41 U/L '27 19 10  ' ALT 0 - 44 U/L 21 15 8(L)   . Lab Results  Component Value Date   LDH 156 10/19/2020          RADIOGRAPHIC STUDIES: I have personally reviewed the radiological images as listed and agreed with the findings in the report. CT Abdomen Pelvis W Contrast  Result Date: 09/19/2020 CLINICAL DATA:  Abdominal pain, CLL EXAM: CT ABDOMEN AND PELVIS WITH CONTRAST TECHNIQUE: Multidetector CT imaging of the abdomen and pelvis was performed using the standard protocol following bolus administration of intravenous contrast. CONTRAST:  147m ISOVUE-300 IOPAMIDOL (ISOVUE-300) INJECTION 61%, additional oral enteric contrast COMPARISON:  05/25/2016 FINDINGS: Lower chest: No acute abnormality. Hepatobiliary: No solid liver abnormality is seen. No gallstones, gallbladder wall thickening, or biliary dilatation. Pancreas: Unremarkable. No pancreatic ductal dilatation or surrounding inflammatory changes. Spleen: Normal in size without significant abnormality. Adrenals/Urinary Tract: Adrenal glands are unremarkable. Kidneys are normal, without renal calculi, solid lesion, or hydronephrosis. Bladder is unremarkable. Stomach/Bowel: Stomach is within normal limits. Appendix appears normal. No evidence of bowel wall thickening, distention, or inflammatory changes. Occasional sigmoid diverticula. Vascular/Lymphatic: Aortic atherosclerosis. No enlarged abdominal or pelvic lymph nodes. Reproductive: Prostatomegaly. Other: No abdominal wall hernia or abnormality. No abdominopelvic ascites. Musculoskeletal: No acute or significant osseous findings. IMPRESSION: 1. No CT findings of the abdomen or pelvis to explain abdominal pain. 2.  Prostatomegaly. 3. No lymphadenopathy in the abdomen or pelvis per history of CLL. Aortic Atherosclerosis (ICD10-I70.0). Electronically Signed   By: AEddie CandleM.D.   On: 09/19/2020 14:41    ASSESSMENT & PLAN:   80y.o. 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 FISH panel showed p53 (17p13) deletion  that would be consistent with this diagnosis . It often represents also be more aggressive form of CLL Differential diagnosis includes - CD5 neg CLL vs splenic lymphoma vs Marginal Zone lymphoma vs LPL (lymphoplasmacytic lymphoma) LDH WNL suggests against a high grade process. Currently no anemia or thrombocytopenia noted. Now with drenching night sweats and fatigue, no fevers/chills.  10/31/16 PET/CT scan shows some small hypermetabolic nodes in the thorax including bilateral hilar paratracheal) esophageal. These would be difficult to biopsy.  Lost to follow up between 11/06/16 and 11/19/18  12/01/18 PET/CT revealed Stable scattered small hypermetabolic thoracic lymph nodes, once again at [place] 5 activity level. 2. No findings of involvement in the neck, abdomen/pelvis, or skeleton. 3. Prostatomegaly. 4. Aortic Atherosclerosis and Emphysema 5. Sigmoid colon diverticulosis  PLAN:  -Discussed pt labwork today, 10/19/2020; blood counts normal, chemistries normal but borderline low potassium -Recommend pt try different activities to relieve anxiety and mind-- stationary bike, walking, yoga, meditation.  -Recommend pt increase potassium intake-- bananas, oranges, or potassium supplement. -Recommend pt use a short-acting medicine such as Lorazepam for anxiety prn. Advised pt to f/u with PCP for this. -No lab, clinical,evidence of significant disease burden at this time.   -Will see back in 4 months with labs.    2) . Patient Active Problem List   Diagnosis Date Noted  . Burping 10/17/2020  . BMI 26.0-26.9,adult 03/06/2020  . Gastric AVM  03/06/2020  . History of colon polyps 03/06/2020  . Aortic atherosclerosis ([place]) 08/23/2019  . Memory changes 05/25/2019  . Counseling regarding advance care planning and goals of care 01/24/2019  . Anxiety 02/08/2018  . CLL (chronic lymphocytic leukemia) ([place]) 11/01/2016  . Encounter for Medicare annual wellness exam 09/11/2015  . GERD  02/09/2015  . Medication management 11/02/2013  . Hyperlipidemia   . Hypertension   . Abnormal glucose   . Vitamin D deficiency   . Diverticulosis    -f/u with PCP for management of other medical co-morbids   FOLLOW UP: RTC w Dr Irene Limbo in 4 months with labs    The total time spent in the appointment was 20 minutes and more than 50% was on counseling and direct patient cares.  All of the patient's questions were answered with apparent satisfaction. The patient knows to call the clinic with any problems, questions or concerns.   Sullivan Lone MD [place] AAHIVMS [facility] Hematology/Oncology Physician [place]  (Office):       [phone] (Work cell):  [phone] (Fax):           [phone]  10/18/2020 12:31 PM  I, Reinaldo Raddle, am acting as scribe for Dr. Sullivan Lone, MD.   .I have reviewed the above documentation for accuracy and completeness, and I agree with the above. Brunetta Genera MD

## 2020-10-19 ENCOUNTER — Other Ambulatory Visit: Payer: Self-pay

## 2020-10-19 ENCOUNTER — Inpatient Hospital Stay: Payer: Medicare HMO | Admitting: Hematology

## 2020-10-19 ENCOUNTER — Inpatient Hospital Stay: Payer: Medicare HMO | Attending: Hematology

## 2020-10-19 VITALS — BP 117/71 | HR 75 | Temp 97.5°F | Resp 18 | Ht 71.0 in | Wt 173.1 lb

## 2020-10-19 DIAGNOSIS — Z79899 Other long term (current) drug therapy: Secondary | ICD-10-CM | POA: Insufficient documentation

## 2020-10-19 DIAGNOSIS — E559 Vitamin D deficiency, unspecified: Secondary | ICD-10-CM | POA: Diagnosis not present

## 2020-10-19 DIAGNOSIS — C9110 Chronic lymphocytic leukemia of B-cell type not having achieved remission: Secondary | ICD-10-CM

## 2020-10-19 DIAGNOSIS — Q9389 Other deletions from the autosomes: Secondary | ICD-10-CM | POA: Diagnosis not present

## 2020-10-19 DIAGNOSIS — Z87891 Personal history of nicotine dependence: Secondary | ICD-10-CM | POA: Insufficient documentation

## 2020-10-19 DIAGNOSIS — E785 Hyperlipidemia, unspecified: Secondary | ICD-10-CM | POA: Insufficient documentation

## 2020-10-19 DIAGNOSIS — I10 Essential (primary) hypertension: Secondary | ICD-10-CM | POA: Insufficient documentation

## 2020-10-19 LAB — CBC WITH DIFFERENTIAL/PLATELET
Abs Immature Granulocytes: 0.02 10*3/uL (ref 0.00–0.07)
Basophils Absolute: 0.1 10*3/uL (ref 0.0–0.1)
Basophils Relative: 1 %
Eosinophils Absolute: 0 10*3/uL (ref 0.0–0.5)
Eosinophils Relative: 0 %
HCT: 40.5 % (ref 39.0–52.0)
Hemoglobin: 13.8 g/dL (ref 13.0–17.0)
Immature Granulocytes: 0 %
Lymphocytes Relative: 28 %
Lymphs Abs: 1.6 10*3/uL (ref 0.7–4.0)
MCH: 30.9 pg (ref 26.0–34.0)
MCHC: 34.1 g/dL (ref 30.0–36.0)
MCV: 90.6 fL (ref 80.0–100.0)
Monocytes Absolute: 0.6 10*3/uL (ref 0.1–1.0)
Monocytes Relative: 10 %
Neutro Abs: 3.6 10*3/uL (ref 1.7–7.7)
Neutrophils Relative %: 61 %
Platelets: 172 10*3/uL (ref 150–400)
RBC: 4.47 MIL/uL (ref 4.22–5.81)
RDW: 12.9 % (ref 11.5–15.5)
WBC: 6 10*3/uL (ref 4.0–10.5)
nRBC: 0 % (ref 0.0–0.2)

## 2020-10-19 LAB — LACTATE DEHYDROGENASE: LDH: 156 U/L (ref 98–192)

## 2020-10-19 LAB — CMP (CANCER CENTER ONLY)
ALT: 21 U/L (ref 0–44)
AST: 27 U/L (ref 15–41)
Albumin: 4.1 g/dL (ref 3.5–5.0)
Alkaline Phosphatase: 60 U/L (ref 38–126)
Anion gap: 12 (ref 5–15)
BUN: 18 mg/dL (ref 8–23)
CO2: 29 mmol/L (ref 22–32)
Calcium: 9.9 mg/dL (ref 8.9–10.3)
Chloride: 100 mmol/L (ref 98–111)
Creatinine: 1.19 mg/dL (ref 0.61–1.24)
GFR, Estimated: >60 mL/min (ref >60)
Glucose, Bld: 127 mg/dL — ABNORMAL HIGH (ref 70–99)
Potassium: 3.3 mmol/L — ABNORMAL LOW (ref 3.5–5.1)
Sodium: 141 mmol/L (ref 135–145)
Total Bilirubin: 0.8 mg/dL (ref 0.3–1.2)
Total Protein: 6.9 g/dL (ref 6.5–8.1)

## 2020-10-22 ENCOUNTER — Telehealth: Payer: Self-pay

## 2020-10-22 ENCOUNTER — Other Ambulatory Visit: Payer: Self-pay | Admitting: Adult Health

## 2020-10-22 DIAGNOSIS — F419 Anxiety disorder, unspecified: Secondary | ICD-10-CM

## 2020-10-22 MED ORDER — ALPRAZOLAM 0.5 MG PO TABS: ORAL_TABLET | ORAL | 0 refills | Status: DC

## 2020-10-22 NOTE — Telephone Encounter (Signed)
Alprazolam refill request 

## 2020-10-22 NOTE — Progress Notes (Signed)
Future Appointments  Date Time Provider [place]  12/13/2020 11:00 AM Unk Pinto, MD [facility] None  12/20/2020  2:00 PM Marcial Pacas, MD [facility] None  02/15/2021 11:30 AM [facility] None  02/15/2021 12:00 PM Brunetta Genera, MD [facility] None    PDMP reviewed for xanax refill request.

## 2020-10-24 ENCOUNTER — Telehealth: Payer: Self-pay

## 2020-10-24 ENCOUNTER — Other Ambulatory Visit: Payer: Self-pay | Admitting: Adult Health

## 2020-10-24 MED ORDER — QUETIAPINE FUMARATE 25 MG PO TABS: ORAL_TABLET | ORAL | 0 refills | Status: DC

## 2020-10-24 NOTE — Telephone Encounter (Signed)
Wife states that the specialist cannot see Dr. Tyron Russell not until March. States that he is burning up on the inside and cold on the outside. Patient can't sleep and goes outside to get fresh air. ER visit last week but there wasn't anything wrong that they could find.

## 2020-10-25 NOTE — Telephone Encounter (Signed)
Javier [name] notified and 2 week follow up is being made.

## 2020-10-25 NOTE — Telephone Encounter (Signed)
Left message on voice mail  to call back

## 2020-11-05 ENCOUNTER — Other Ambulatory Visit: Payer: Self-pay

## 2020-11-05 DIAGNOSIS — R5383 Other fatigue: Secondary | ICD-10-CM

## 2020-11-05 LAB — POC HEMOCCULT BLD/STL (HOME/3-CARD/SCREEN)
Card #2 Fecal Occult Blod, POC: NEGATIVE
Card #3 Fecal Occult Blood, POC: NEGATIVE
Fecal Occult Blood, POC: NEGATIVE

## 2020-11-06 NOTE — Progress Notes (Signed)
   History of Present Illness:     Patient is a very nice 80 yo MBM with HTN, HLD, Pre-Diabetes, smoldering CLL and Vitamin D Deficiency.   Presents for 1 month f/u after adding Zoloft f or acute & chronic anxiety. Patient reports feeling better - less anxious. Patient's wife reports that patient is still having episodes of agitation thru-out the day and worsening as day progresses.  Medications  .  losartan-hctz 100-25 MG tablet, Take      1 tablet     Daily   .  triamcinolone (NASACORT) 55 MCG/ACT AERO nasal inhaler, Place 2 sprays into the nose at bedtime. (Patient taking differently: Place 2 sprays into the nose as needed.)  .  aspirin EC 81 MG tablet, Take 81 mg by mouth daily.  Marland Kitchen  ALPRAZolam 0.5 MG, TAKE 1/2 -1 TAB 2-3 TIMES A DAY ONLY IF NEEDED  .  MYLANTA , Take  as needed. Marland Kitchen  VITAMIN D 5000 units TABS, Take 5,000 Units by mouth daily.  .  QUEtiapine 25 MG tablet, Start taking 1 tab at bedtime daily for agitation and anxiety; add a second tab at lunch time if needed. .  sertraline (ZOLOFT) 50 MG tablet, Take 1 tablet (50 mg total) by mouth daily.  Marland Kitchen  venetoclax (VENCLEXTA) 100 MG TABS, Take 200 mg by mouth daily. Take with food and water.  Problem list He has Hyperlipidemia; Hypertension; Abnormal glucose; Vitamin D deficiency; Diverticulosis; Medication management; GERD ; Encounter for Medicare annual wellness exam; CLL (chronic lymphocytic leukemia) ([place]); Anxiety; Counseling regarding advance care planning and goals of care; Memory changes; Aortic atherosclerosis ([place]); BMI 26.0-26.9,adult; Gastric AVM; History of colon polyps; and Burping on their problem list.   Observations/Objective:  BP 118/60   Pulse 67   Temp (!) 97.2 F (36.2 C)   Resp 16   Ht 5\' 11"  (1.803 m)   Wt 174 lb (78.9 kg)   SpO2 99%   BMI 24.27 kg/m   HEENT - WNL. Neck - supple.  Chest - Clear equal BS. Cor - Nl HS. RRR w/o sig MGR. PP 1(+). No edema. MS- FROM w/o deformities.  Gait Nl. Neuro  -  Nl w/o focal abnormalities.  Assessment and Plan:  1. Dyspepsia  - sucralfate (CARAFATE) 1 g tablet; Take  1 tablet  4 x/day  before Meals & Bedtime  for Stomach Burning  Dispense: 360 tablet  - omeprazole (PRILOSEC) 40 MG capsule; Take 1 capsule 2 x /day  for Indigestion & Burning  Dispense: 180 capsule  2. Memory changes  3. Agitation  - QUEtiapine 50 MG tablet; Take 1 tablet 3 x /day at Lunch, Suppertime &  Bedtime  Disp: 90 tablet  Follow Up Instructions:     I discussed the assessment and treatment plan with the patient. The patient was provided an opportunity to ask questions and all were answered. The patient agreed with the plan and demonstrated an understanding of the instructions.      The patient was advised to call back or seek an in-person evaluation if the symptoms worsen or if the condition fails to improve as anticipated.   Kirtland Bouchard, MD

## 2020-11-07 ENCOUNTER — Other Ambulatory Visit: Payer: Self-pay

## 2020-11-07 ENCOUNTER — Encounter: Payer: Self-pay | Admitting: Internal Medicine

## 2020-11-07 ENCOUNTER — Ambulatory Visit (INDEPENDENT_AMBULATORY_CARE_PROVIDER_SITE_OTHER): Payer: Medicare HMO | Admitting: Internal Medicine

## 2020-11-07 VITALS — BP 118/60 | HR 67 | Temp 97.2°F | Resp 16 | Ht 71.0 in | Wt 174.0 lb

## 2020-11-07 DIAGNOSIS — R413 Other amnesia: Secondary | ICD-10-CM

## 2020-11-07 DIAGNOSIS — R1013 Epigastric pain: Secondary | ICD-10-CM

## 2020-11-07 DIAGNOSIS — R451 Restlessness and agitation: Secondary | ICD-10-CM

## 2020-11-07 MED ORDER — QUETIAPINE FUMARATE 50 MG PO TABS: ORAL_TABLET | ORAL | 0 refills | Status: DC

## 2020-11-07 MED ORDER — SUCRALFATE 1 G PO TABS: ORAL_TABLET | ORAL | 0 refills | Status: DC

## 2020-11-07 MED ORDER — OMEPRAZOLE 40 MG PO CPDR: DELAYED_RELEASE_CAPSULE | ORAL | 0 refills | Status: DC

## 2020-11-07 NOTE — Patient Instructions (Signed)
http://NIMH.NIH.Gov">  Generalized Anxiety Disorder, Adult Generalized anxiety disorder (GAD) is a mental health condition. Unlike normal worries, anxiety related to GAD is not triggered by a specific event. These worries do not fade or get better with time. GAD interferes with relationships, work, and school. GAD symptoms can vary from mild to severe. People with severe GAD can have intense waves of anxiety with physical symptoms that are similar to panic attacks. What are the causes? The exact cause of GAD is not known, but the following are believed to have an impact:  Differences in natural brain chemicals.  Genes passed down from parents to children.  Differences in the way threats are perceived.  Development during childhood.  Personality. What increases the risk? The following factors may make you more likely to develop this condition:  Being male.  Having a family history of anxiety disorders.  Being very shy.  Experiencing very stressful life events, such as the death of a loved one.  Having a very stressful family environment. What are the signs or symptoms? People with GAD often worry excessively about many things in their lives, such as their health and family. Symptoms may also include:  Mental and emotional symptoms: ? Worrying excessively about natural disasters. ? Fear of being late. ? Difficulty concentrating. ? Fears that others are judging your performance.  Physical symptoms: ? Fatigue. ? Headaches, muscle tension, muscle twitches, trembling, or feeling shaky. ? Feeling like your heart is pounding or beating very fast. ? Feeling out of breath or like you cannot take a deep breath. ? Having trouble falling asleep or staying asleep, or experiencing restlessness. ? Sweating. ? Nausea, diarrhea, or irritable bowel syndrome (IBS).  Behavioral symptoms: ? Experiencing erratic moods or irritability. ? Avoidance of new situations. ? Avoidance of  people. ? Extreme difficulty making decisions. How is this diagnosed? This condition is diagnosed based on your symptoms and medical history. You will also have a physical exam. Your health care provider may perform tests to rule out other possible causes of your symptoms. To be diagnosed with GAD, a person must have anxiety that:  Is out of his or her control.  Affects several different aspects of his or her life, such as work and relationships.  Causes distress that makes him or her unable to take part in normal activities.  Includes at least three symptoms of GAD, such as restlessness, fatigue, trouble concentrating, irritability, muscle tension, or sleep problems. Before your health care provider can confirm a diagnosis of GAD, these symptoms must be present more days than they are not, and they must last for 6 months or longer. How is this treated? This condition may be treated with:  Medicine. Antidepressant medicine is usually prescribed for long-term daily control. Anti-anxiety medicines may be added in severe cases, especially when panic attacks occur.  Talk therapy (psychotherapy). Certain types of talk therapy can be helpful in treating GAD by providing support, education, and guidance. Options include: ? Cognitive behavioral therapy (CBT). People learn coping skills and self-calming techniques to ease their physical symptoms. They learn to identify unrealistic thoughts and behaviors and to replace them with more appropriate thoughts and behaviors. ? Acceptance and commitment therapy (ACT). This treatment teaches people how to be mindful as a way to cope with unwanted thoughts and feelings. ? Biofeedback. This process trains you to manage your body's response (physiological response) through breathing techniques and relaxation methods. You will work with a therapist while machines are used to monitor your physical   symptoms.  Stress management techniques. These include yoga,  meditation, and exercise. A mental health specialist can help determine which treatment is best for you. Some people see improvement with one type of therapy. However, other people require a combination of therapies.   Follow these instructions at home: Lifestyle  Maintain a consistent routine and schedule.  Anticipate stressful situations. Create a plan, and allow extra time to work with your plan.  Practice stress management or self-calming techniques that you have learned from your therapist or your health care provider. General instructions  Take over-the-counter and prescription medicines only as told by your health care provider.  Understand that you are likely to have setbacks. Accept this and be kind to yourself as you persist to take better care of yourself.  Recognize and accept your accomplishments, even if you judge them as small.  Keep all follow-up visits as told by your health care provider. This is important. Contact a health care provider if:  Your symptoms do not get better.  Your symptoms get worse.  You have signs of depression, such as: ? A persistently sad or irritable mood. ? Loss of enjoyment in activities that used to bring you joy. ? Change in weight or eating. ? Changes in sleeping habits. ? Avoiding friends or family members. ? Loss of energy for normal tasks. ? Feelings of guilt or worthlessness. Get help right away if:  You have serious thoughts about hurting yourself or others. If you ever feel like you may hurt yourself or others, or have thoughts about taking your own life, get help right away. Go to your nearest emergency department or:  Call your local emergency services (911 in the [place]  Call a suicide crisis helpline, such as the National Suicide Prevention Lifeline at 1-800-273-8255. This is open 24 hours a day in the [place]  Text the Crisis Text Line at 741741 (in the [place] Summary  Generalized anxiety disorder (GAD) is a mental  health condition that involves worry that is not triggered by a specific event.  People with GAD often worry excessively about many things in their lives, such as their health and family.  GAD may cause symptoms such as restlessness, trouble concentrating, sleep problems, frequent sweating, nausea, diarrhea, headaches, and trembling or muscle twitching.  A mental health specialist can help determine which treatment is best for you. Some people see improvement with one type of therapy. However, other people require a combination of therapies. This information is not intended to replace advice given to you by your health care provider. Make sure you discuss any questions you have with your health care provider. Document Revised: 07/06/2019 Document Reviewed: 07/06/2019 Elsevier Patient Education  2021 [employer].  

## 2020-11-12 ENCOUNTER — Ambulatory Visit: Payer: Medicare HMO | Admitting: Internal Medicine

## 2020-11-21 ENCOUNTER — Other Ambulatory Visit: Payer: Self-pay

## 2020-11-21 ENCOUNTER — Encounter: Payer: Self-pay | Admitting: Internal Medicine

## 2020-11-21 ENCOUNTER — Ambulatory Visit (INDEPENDENT_AMBULATORY_CARE_PROVIDER_SITE_OTHER): Payer: Medicare HMO | Admitting: Internal Medicine

## 2020-11-21 VITALS — BP 122/64 | HR 64 | Temp 97.0°F | Resp 16 | Ht 71.0 in | Wt 178.2 lb

## 2020-11-21 DIAGNOSIS — R451 Restlessness and agitation: Secondary | ICD-10-CM

## 2020-11-21 DIAGNOSIS — R413 Other amnesia: Secondary | ICD-10-CM | POA: Diagnosis not present

## 2020-11-21 DIAGNOSIS — R1013 Epigastric pain: Secondary | ICD-10-CM | POA: Diagnosis not present

## 2020-11-21 NOTE — Progress Notes (Signed)
   History of Present Illness:   This very nice 80 yo MBM  With acute & chronic anxiety returns for f/u. Patient had been started on Zoloft   10/09/20 and low dose Alprazolam  & at OV on 11/07/20 patient was still having episodes of agitation and had Seroquel 50 mg tid added . Wife reports his is less agitated with the Seroquel , but sometimes seems over sedated.  Patient reports that his dyspepsia is better on the Carafate.   Medications  .  losartan-hctz 100-25 MG tablet, Take      1 tablet     Daily      for BP & Fluid Retention /Ankle Swelling        .  NASACORTnasal inhaler, Place 2 sprays into the nose at bedtime.  Marland Kitchen  aspirin EC 81 MG tablet, Take 81 mg by mouth daily. Marland Kitchen  ALPRAZolam (XANAX) 0.5 MG tablet, TAKE 1/2 -1 TABLET  2-3 TIMES A DAY ONLY IF NEEDED FOR ANXIETY ATTACK  .  Alum & Mag Hydroxide-Simeth , Take  as needed. Marland Kitchen  VITAMIN D   5000 units TABS, Take 5,000 Units b daily. Marland Kitchen  omeprazole  40 MG capsule, Take 1 capsule 2 x /day  for Indigestion & Burning .  QUEtiapine (SEROQUEL) 50 MG tablet, Take 1 tablet 3 x /day at Lunch, Suppertime &  Bedtime .  sertraline (ZOLOFT) 50 MG tablet, Take 1 tablet ( daily. .  sucralfate (CARAFATE) 1 g tablet, Take  1 tablet  4 x/day  before Meals & Bedtime  for Stomach Burning  Problem list  He has Hyperlipidemia; Abnormal glucose; Vitamin D deficiency; Diverticulosis; Medication management; GERD ; Encounter for Medicare annual wellness exam; CLL (chronic lymphocytic leukemia) ([place]); Anxiety; Counseling regarding advance care planning and goals of care; Memory changes; Aortic atherosclerosis ([place]); BMI 26.0-26.9,adult; Gastric AVM; History of colon polyps; and Burping on their problem list.   Observations/Objective:   BP 122/64   Pulse 64   Temp (!) 97 F (36.1 C)   Resp 16   Ht 5\' 11"  (1.803 m)   Wt 178 lb 3.2 oz (80.8 kg)   SpO2 98%   BMI 24.85 kg/m   HEENT - WNL. Neck - supple.  Chest - Clear equal BS. Cor - Nl HS. RRR w/o sig MGR.  PP 1(+). No edema. MS- FROM w/o deformities.  Gait Nl. Neuro -  Nl w/o focal abnormalities.   Assessment and Plan:  1. Memory changes  2. Agitation  - Advised try cut down the Seroquel to 1/2 tablet 3 x /day.  3. Dyspepsia  - improved on Carafate  Follow Up Instructions:       I discussed the assessment and treatment plan with the patient. The patient was provided an opportunity to ask questions and all were answered. The patient agreed with the plan and demonstrated an understanding of the instructions.       The patient was advised to call back or seek an in-person evaluation if the symptoms worsen or if the condition fails to improve as anticipated.   Kirtland Bouchard, MD

## 2020-11-27 ENCOUNTER — Other Ambulatory Visit: Payer: Self-pay | Admitting: Internal Medicine

## 2020-11-27 DIAGNOSIS — I10 Essential (primary) hypertension: Secondary | ICD-10-CM

## 2020-11-27 DIAGNOSIS — F419 Anxiety disorder, unspecified: Secondary | ICD-10-CM

## 2020-11-27 MED ORDER — ALPRAZOLAM 0.5 MG PO TABS
ORAL_TABLET | ORAL | 0 refills | Status: DC
Start: 2020-11-27 — End: 2021-01-09

## 2020-11-30 ENCOUNTER — Other Ambulatory Visit: Payer: Self-pay | Admitting: Internal Medicine

## 2020-11-30 DIAGNOSIS — F419 Anxiety disorder, unspecified: Secondary | ICD-10-CM

## 2020-12-03 ENCOUNTER — Other Ambulatory Visit: Payer: Self-pay | Admitting: *Deleted

## 2020-12-03 DIAGNOSIS — I10 Essential (primary) hypertension: Secondary | ICD-10-CM

## 2020-12-03 MED ORDER — LOSARTAN POTASSIUM-HCTZ 100-25 MG PO TABS: ORAL_TABLET | ORAL | 0 refills | Status: DC

## 2020-12-13 ENCOUNTER — Encounter: Payer: Medicare HMO | Admitting: Internal Medicine

## 2020-12-20 ENCOUNTER — Encounter: Payer: Self-pay | Admitting: Neurology

## 2020-12-20 ENCOUNTER — Ambulatory Visit: Payer: Medicare HMO | Admitting: Neurology

## 2020-12-20 VITALS — BP 142/79 | HR 60 | Ht 71.0 in | Wt 167.5 lb

## 2020-12-20 DIAGNOSIS — R413 Other amnesia: Secondary | ICD-10-CM | POA: Diagnosis not present

## 2020-12-20 DIAGNOSIS — R410 Disorientation, unspecified: Secondary | ICD-10-CM | POA: Insufficient documentation

## 2020-12-20 DIAGNOSIS — R269 Unspecified abnormalities of gait and mobility: Secondary | ICD-10-CM

## 2020-12-20 MED ORDER — SERTRALINE HCL 50 MG PO TABS: 100.0000 mg | ORAL_TABLET | Freq: Every day | ORAL | 11 refills | Status: DC

## 2020-12-20 NOTE — Progress Notes (Signed)
Chief Complaint  Patient presents with  . New Patient (Initial Visit)    MMSE 28/30 - 6 animals. He is here with his wife, Javier [name]. Reports worsening memory over time.       ASSESSMENT AND PLAN  Javier [name] is a 80 y.o. male  Mild cognitive impairment  Mini-Mental Status Examination 28/30  Early morning agitation, excessive sweat.  Following his chemotherapy for CLL  Unsure etiology,  This might be related to his anxiety, with his underlying cognitive impairment, will increase Zoloft from 50 to 100 mg daily, continue Seroquel 50 mg every night  Complete evaluation with MRI of the brain  Laboratory evaluations to rule out treatable etiology  Gait abnormality  Refer to physical therapy    DIAGNOSTIC DATA (LABS, IMAGING, TESTING) - I reviewed patient records, labs, notes, testing and imaging myself where available.   HISTORICAL  Javier [name], is a 80 year old male seen in request by her primary care physician Javier [name], Javier [name] for evaluation of memory loss, initial evaluation was on December 20, 2020.  He is accompanied by his wife at today's clinical visit.  I reviewed and summarized the referring note.PMHX. HTN Anxiety, zoloft since 2020, seroquel 50mg  qhs, xanax 0.5mg    He is a retired Furniture conservator/restorer, denied family history of memory loss, he was diagnosed with chronic lymphocytic leukemia, CAT scan 2018 showed small hypermetabolic node in left thorax, received chemotherapy, finished treatment since 2021, was under the care of oncologist Javier [name], most recent visit was on June 22, 2020 he was treated with venetoclax plus obinutuzumab from May to Sept 2020.  Since he finished chemotherapy, around the beginning of 2021, was noted to have early morning excessive sweat, anxiety, felt burning hot, he often has to get up from bed, go outside only rarely sleep closed, no shoes on, then he felt cold, has to come in, but could not stay in for too long because of burning  hot, sweaty, he has to go outside again, this usually happens 3-4 AM, he will often get in and out of house multiple times, and gradually settle down around 9 AM,  He was given Xanax, which is somewhat helpful, but on one occasion, he took excessive amount of Xanax, become very sleepy  He can fall to sleep okay after taking Seroquel 50 mg every night, wife also reported that he become very sedentary since treatment, he cannot get his mind off something if he begin to focus on that  REVIEW OF SYSTEMS: Full 14 system review of systems performed and notable only for as above All other review of systems were negative.  PHYSICAL EXAM   Vitals:   12/20/20 1401  BP: (!) 142/79  Pulse: 60  Weight: 167 lb 8 oz (76 kg)  Height: 5\' 11"  (1.803 m)   Not recorded     Body mass index is 23.36 kg/m.  PHYSICAL EXAMNIATION:  Gen: NAD, conversant, well nourised, well groomed                     Cardiovascular: Regular rate rhythm, no peripheral edema, warm, nontender. Eyes: Conjunctivae clear without exudates or hemorrhage Neck: Supple, no carotid bruits. Pulmonary: Clear to auscultation bilaterally   NEUROLOGICAL EXAM:  MENTAL STATUS: MMSE - Mini Mental State Exam 12/20/2020 05/25/2019  Orientation to time 5 5  Orientation to Place 5 5  Registration 2 3  Attention/ Calculation 5 5  Recall 3 2  Language- name 2 objects 2 2  Language-  repeat 1 1  Language- follow 3 step command 3 3  Language- read & follow direction 1 1  Write a sentence 1 1  Copy design 0 1  Total score 28 29  animal naming 6.    CRANIAL NERVES: CN II: Visual fields are full to confrontation. Pupils are round equal and briskly reactive to light. CN III, IV, VI: extraocular movement are normal. No ptosis. CN V: Facial sensation is intact to light touch CN VII: Face is symmetric with normal eye closure  CN VIII: Hearing is normal to causal conversation. CN IX, X: Phonation is normal. CN XI: Head turning and  shoulder shrug are intact  MOTOR: There is no pronator drift of out-stretched arms. Muscle bulk and tone are normal. Muscle strength is normal.  REFLEXES: Reflexes are 2+ and symmetric at the biceps, triceps, knees, and ankles. Plantar responses are flexor.  SENSORY: Intact to light touch, pinprick and vibratory sensation are intact in fingers and toes.  COORDINATION: There is no trunk or limb dysmetria noted.  GAIT/STANCE: He needs push-up to get up from seated position, leaning forward, cautious mildly unsteady gait  Romberg is absent.  ALLERGIES: Allergies  Allergen Reactions  . Penicillins Shortness Of Breath, Swelling and Other (See Comments)    Unknown allergic reaction as a teenager  . 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 ALPRAZolam (XANAX) 0.5 MG tablet Take  1/2 -1 tablet  2-3 x /day  ONLY  if needed for Anxiety Attack &  limit to 5 days /week to avoid Addiction & Dementia (Must last 6 weeks til April 10th) 90 tablet 0  . Alum & Mag Hydroxide-Simeth (MYLANTA PO) Take by mouth as needed.    Marland Kitchen aspirin EC 81 MG tablet Take 81 mg by mouth daily.    . Cholecalciferol (VITAMIN D3) 5000 units TABS Take 5,000 Units by mouth daily.    Marland Kitchen losartan-hydrochlorothiazide (HYZAAR) 100-25 MG tablet Take  1 tablet  Daily  for BP & Fluid Retention /Ankle Swelling 90 tablet 0  . omeprazole (PRILOSEC) 40 MG capsule Take 1 capsule 2 x /day  for Indigestion & Burning 180 capsule 0  . QUEtiapine (SEROQUEL) 50 MG tablet Take 1 tablet 3 x /day at Lunch, Suppertime &  Bedtime 90 tablet 0  . sertraline (ZOLOFT) 50 MG tablet Take 1 tablet (50 mg total) by mouth daily. 90 tablet 0  . sucralfate (CARAFATE) 1 g tablet Take  1 tablet  4 x/day  before Meals & Bedtime  for Stomach Burning 360 tablet 0  . triamcinolone (NASACORT) 55 MCG/ACT AERO nasal inhaler Place 2 sprays into the  nose at bedtime. (Patient taking differently: Place 2 sprays into the nose as needed.) 1 Inhaler 3   No current facility-administered medications for this visit.    PAST MEDICAL HISTORY: Past Medical History:  Diagnosis Date  . Diverticulosis   . Hyperlipidemia   . Hypertension   . Memory loss   . Prediabetes   . Vitamin D deficiency     PAST SURGICAL HISTORY: Past Surgical History:  Procedure Laterality Date  . NO PAST SURGERIES      FAMILY HISTORY: Family History  Problem Relation Age of Onset  . Stroke Mother   . Early death Father        Farm/tractor accident  . Diabetes Brother   . Cirrhosis Brother   .  Colon cancer Paternal Grandfather     SOCIAL HISTORY: Social History   Socioeconomic History  . Marital status: Married    Spouse name: Not on file  . Number of children: 5  . Years of education: 9th grade  . Highest education level: Not on file  Occupational History  . Occupation: retired  Tobacco Use  . Smoking status: Former Smoker    Types: Cigarettes    Quit date: 01/17/2003    Years since quitting: 17.9  . Smokeless tobacco: Never Used  Substance and Sexual Activity  . Alcohol use: Yes    Comment: very rare  . Drug use: No  . Sexual activity: Not on file  Other Topics Concern  . Not on file  Social History Narrative   Lives at home with his wife.   Right-handed.   At most, one cup caffeine per day.    Social Determinants of Health   Financial Resource Strain: Not on file  Food Insecurity: Not on file  Transportation Needs: Not on file  Physical Activity: Not on file  Stress: Not on file  Social Connections: Not on file  Intimate Partner Violence: Not on file      Marcial Pacas, M.D. Ph.D.  [facility] [address] [place], [place] [address] Ph: [phone] Fax: [phone]  CC:  Unk Pinto, MD [address] [place],  [place] [address]  Unk Pinto, MD

## 2020-12-21 ENCOUNTER — Other Ambulatory Visit (HOSPITAL_COMMUNITY): Payer: Self-pay

## 2020-12-21 LAB — ANA W/REFLEX IF POSITIVE
Anti JO-1: <0.2 AI (ref 0.0–0.9)
Anti Nuclear Antibody (ANA): POSITIVE — AB
Centromere Ab Screen: <0.2 AI (ref 0.0–0.9)
Chromatin Ab SerPl-aCnc: <0.2 AI (ref 0.0–0.9)
ENA RNP Ab: 1.1 AI — ABNORMAL HIGH (ref 0.0–0.9)
ENA SM Ab Ser-aCnc: <0.2 AI (ref 0.0–0.9)
ENA SSA (RO) Ab: <0.2 AI (ref 0.0–0.9)
ENA SSB (LA) Ab: <0.2 AI (ref 0.0–0.9)
Scleroderma (Scl-70) (ENA) Antibody, IgG: <0.2 AI (ref 0.0–0.9)
dsDNA Ab: 1 IU/mL (ref 0–9)

## 2020-12-21 LAB — THYROID PANEL WITH TSH
Free Thyroxine Index: 2.1 (ref 1.2–4.9)
T3 Uptake Ratio: 26 % (ref 24–39)
T4, Total: 8.2 ug/dL (ref 4.5–12.0)
TSH: 0.989 u[IU]/mL (ref 0.450–4.500)

## 2020-12-21 LAB — CK: Total CK: 70 U/L (ref 41–331)

## 2020-12-21 LAB — VITAMIN B12: Vitamin B-12: 834 pg/mL (ref 232–1245)

## 2020-12-21 LAB — C-REACTIVE PROTEIN: CRP: 2 mg/L (ref 0–10)

## 2020-12-21 LAB — SEDIMENTATION RATE: Sed Rate: 2 mm/hr (ref 0–30)

## 2020-12-21 LAB — HGB A1C W/O EAG: Hgb A1c MFr Bld: 5.6 % (ref 4.8–5.6)

## 2020-12-24 ENCOUNTER — Telehealth: Payer: Self-pay | Admitting: Neurology

## 2020-12-24 NOTE — Telephone Encounter (Signed)
Javier [name] Javier [name]: [id number] (exp. 12/24/20 to 01/23/21) order sent to GI. They will reach out to the patient to schedule.

## 2020-12-24 NOTE — Telephone Encounter (Signed)
I spoke to the patient's wife and provided her with the lab results. She expressed understanding.

## 2020-12-24 NOTE — Telephone Encounter (Signed)
Please call patient, the only abnormality on extensive laboratory evaluation was positive ANA, with mild elevation of ENA RNP antibody, and has unknown clinical significance  Rest of the laboratory evaluations were normal.

## 2020-12-26 ENCOUNTER — Ambulatory Visit: Payer: Medicare HMO

## 2020-12-26 ENCOUNTER — Ambulatory Visit: Payer: Medicare HMO | Attending: Neurology

## 2020-12-26 ENCOUNTER — Other Ambulatory Visit: Payer: Self-pay

## 2020-12-26 DIAGNOSIS — M6281 Muscle weakness (generalized): Secondary | ICD-10-CM | POA: Diagnosis not present

## 2020-12-26 DIAGNOSIS — R2681 Unsteadiness on feet: Secondary | ICD-10-CM | POA: Diagnosis not present

## 2020-12-26 NOTE — Patient Instructions (Signed)
Access Code: [email or web address] URL: [email or web address] Date: 12/26/2020 Prepared by: Sharlynn Oliphant  Exercises Tandem Stance in Corner - 1 x daily - 7 x weekly - 3 sets - 10 reps Standing Near Stance in [place] with Eyes Closed - 1 x daily - 7 x weekly - 3 sets - 10 reps

## 2020-12-27 ENCOUNTER — Ambulatory Visit
Admission: RE | Admit: 2020-12-27 | Discharge: 2020-12-27 | Disposition: A | Discharge status: Home / Self Care | Payer: Medicare HMO | Source: Ambulatory Visit | Attending: Neurology | Admitting: Neurology

## 2020-12-27 DIAGNOSIS — R413 Other amnesia: Secondary | ICD-10-CM | POA: Diagnosis not present

## 2020-12-27 DIAGNOSIS — R269 Unspecified abnormalities of gait and mobility: Secondary | ICD-10-CM | POA: Diagnosis not present

## 2020-12-27 DIAGNOSIS — R410 Disorientation, unspecified: Secondary | ICD-10-CM

## 2020-12-27 NOTE — Therapy (Signed)
[facility] [address] [place], [place], [address] Phone: [phone]   Fax:  [phone]  Physical Therapy Evaluation  Patient Details  Name: Javier [name] MRN: [id number] Date of Birth: [birth year] Referring Provider (PT): Dr. Marcial Pacas   Encounter Date: 12/26/2020   PT End of Session - 12/27/20 1013    Visit Number 1    Number of Visits 17    Date for PT Re-Evaluation 02/28/21    Authorization Type [facility] ACO    PT Start Time 1230    PT Stop Time 1315    PT Time Calculation (min) 45 min    Equipment Utilized During Treatment Gait belt    Activity Tolerance Patient tolerated treatment well    Behavior During Therapy [facility] for tasks assessed/performed;Flat affect           Past Medical History:  Diagnosis Date  . Diverticulosis   . Hyperlipidemia   . Hypertension   . Memory loss   . Prediabetes   . Vitamin D deficiency     Past Surgical History:  Procedure Laterality Date  . NO PAST SURGERIES      There were no vitals filed for this visit.    Subjective Assessment - 12/26/20 1236    Subjective Patient referred to OPPT due to a decline in balance and overall endurance over the past few months, he denies pain or trauma, he is indergoing med changes for memory loss and mood changes.  He is a fair historian and accompanied by spouse today.  He denies HAs, dizziness , LOB just notes fatigue worse in AM but progressively improves as day goes on    Patient is accompained by: Family member    How long can you sit comfortably? unlimited    How long can you stand comfortably? 15'    How long can you walk comfortably? 10'    Patient Stated Goals To retur to his prior ambulation status    Currently in Pain? No/denies              [facility] PT Assessment - 12/27/20 0001      Timed Up and Go Test   Normal TUG (seconds) 18.1    TUG Comments average of 2 trials w/o AD             12/26/20 0001  Assessment   Medical Diagnosis gait disorder  Referring Provider (PT) Dr. Marcial Pacas  Onset Date/Surgical Date 12/20/20  Next MD Visit following imaging studies  Prior Therapy none  Precautions  Precautions Fall  Balance Screen  Has the patient fallen in the past 6 months No  [place] residence  Living Arrangements Spouse/significant other  Available Help at Discharge Family  Type of [place] to enter  Entrance Stairs-Number of Steps 2  Entrance Stairs-Rails None  Home Layout One level  [place] - single point  Prior Function  Level of Independence Independent  ROM / Strength  AROM / PROM / Strength Strength  Strength  Overall Strength Within functional limits for tasks performed  Bed Mobility  Bed Mobility Not assessed  Transfers  Transfers Sit to Stand  Sit to Stand 5: Supervision  Five time sit to stand comments  19.66s  Comments need of BUE support  High Level Balance  High Level Balance Comments MCTSIB 30s at position 1,2, 3, 12s postion 4 (mild sway at position 3)  Objective measurements completed on examination: See above findings.               PT Education - 12/26/20 1303    Education Details [email or web address]    Person(s) Educated Patient;Spouse    Methods Explanation;Demonstration;Tactile cues;Verbal cues;Handout    Comprehension Verbalized understanding;Returned demonstration;Verbal cues required;Tactile cues required;Need further instruction            PT Short Term Goals - 12/27/20 1035      PT SHORT TERM GOAL #1   Title Demo Initial HEP back to PT w/o need of cuing    Baseline initial HEP just given    Time 4    Period Weeks    Status New    Target Date 01/24/21      PT SHORT TERM GOAL #2   Title Assess BERG and establish goals    Baseline NT    Time 4    Period Weeks    Status New    Target Date 01/24/21      PT SHORT TERM GOAL #3   Title Assess DGI/FGA  and establish goals    Baseline NT    Time 4    Period Weeks    Status New    Target Date 01/24/21      PT SHORT TERM GOAL #4   Title patient to ambulate 243ft with LRAD and S    Baseline in clinic ambulation <104ft w/o AD    Time 4    Period Weeks    Target Date 01/24/21      PT SHORT TERM GOAL #5   Title Patient to perform 5x STS in <15s    Baseline 5x STS score of 19.66s    Time 4    Period Weeks    Status New    Target Date 01/24/21             PT Long Term Goals - 12/27/20 1040      PT LONG TERM GOAL #1   Title Assess BERG score    Baseline NT    Time 8    Period Weeks    Status New    Target Date 02/28/21      PT LONG TERM GOAL #2   Title assess DGI/FGA score    Baseline NT    Time 8    Period Weeks    Status New    Target Date 02/28/21      PT LONG TERM GOAL #3   Title Patient to ambulate 1033ft wth S and LRAD across outdoor surfaces    Baseline <52ft in clinic w/o AD    Time 8    Period Weeks    Status New    Target Date 02/28/21      PT LONG TERM GOAL #4   Title Patient to negotiate full flight of stairs using reciprocal pattern and minimal need of rails    Baseline able to negotiate 2 stairs at home to access/exit house    Time 8    Period Weeks    Status New    Target Date 02/28/21                  Plan - 12/27/20 1014    Clinical Impression Statement Patient presents with good overall LE strength but demos balance changes and slowed movement patterns, no pain or sensory loss noted, suspected psychological issues may be impairing physical performance as evidenced by recent med changes for mood stabilization.  Eval findings, POC an prognosis explained to patient and spouse, both are in agreement    Personal Factors and Comorbidities Comorbidity 2    Comorbidities chronic lymphocytic leukemia, HTN, hyperlipidemia    Examination-Activity Limitations Locomotion Level;Stairs    Examination-Participation Restrictions Medication  Management    Stability/Clinical Decision Making Evolving/Moderate complexity    Clinical Decision Making Low    Rehab Potential Good    PT Frequency 2x / week    PT Duration 8 weeks    PT Treatment/Interventions ADLs/Self Care Home Management;Aquatic Therapy;Gait training;Stair training;Therapeutic activities;Therapeutic exercise;Neuromuscular re-education;Balance training;Patient/family education    PT Next Visit Plan Assess DGI and BERG, follow up with HEP    PT Home Exercise Plan [email or web address]    Consulted and Agree with Plan of Care Patient;Family member/caregiver    Family Member Consulted spouse           Patient will benefit from skilled therapeutic intervention in order to improve the following deficits and impairments:  Abnormal gait,Decreased coordination,Difficulty walking,Decreased endurance,Decreased activity tolerance,Decreased balance,Decreased mobility  Visit Diagnosis: Unsteadiness on feet  Muscle weakness (generalized)     Problem List Patient Active Problem List   Diagnosis Date Noted  . Memory loss 12/20/2020  . Gait abnormality 12/20/2020  . Confusion 12/20/2020  . Burping 10/17/2020  . BMI 26.0-26.9,adult 03/06/2020  . Gastric AVM 03/06/2020  . History of colon polyps 03/06/2020  . Aortic atherosclerosis ([place]) 08/23/2019  . Memory changes 05/25/2019  . Counseling regarding advance care planning and goals of care 01/24/2019  . Anxiety 02/08/2018  . CLL (chronic lymphocytic leukemia) ([place]) 11/01/2016  . Encounter for Medicare annual wellness exam 09/11/2015  . GERD  02/09/2015  . Medication management 11/02/2013  . Hyperlipidemia   . Abnormal glucose   . Vitamin D deficiency   . Diverticulosis     Lanice Shirts PT 12/27/2020, 10:47 AM  [facility] [address] [place], [place], [address] Phone: [phone]   Fax:  [phone]  Name: Javier [name] MRN: [id number] Date of Birth:  07/06/1941

## 2020-12-31 ENCOUNTER — Encounter: Payer: Self-pay | Admitting: Physical Therapy

## 2020-12-31 ENCOUNTER — Ambulatory Visit: Payer: Medicare HMO | Attending: Neurology | Admitting: Physical Therapy

## 2020-12-31 ENCOUNTER — Other Ambulatory Visit: Payer: Self-pay

## 2020-12-31 ENCOUNTER — Telehealth: Payer: Self-pay | Admitting: Neurology

## 2020-12-31 DIAGNOSIS — M6281 Muscle weakness (generalized): Secondary | ICD-10-CM | POA: Insufficient documentation

## 2020-12-31 DIAGNOSIS — R2681 Unsteadiness on feet: Secondary | ICD-10-CM | POA: Diagnosis not present

## 2020-12-31 DIAGNOSIS — R2689 Other abnormalities of gait and mobility: Secondary | ICD-10-CM | POA: Diagnosis not present

## 2020-12-31 NOTE — Telephone Encounter (Signed)
   IMPRESSION: This MRI of the brain without contrast shows the following: 1.   No acute findings. 2.   T2/flair hyperintense foci in the cerebral hemispheres consistent with chronic microvascular ischemic change and small foci in the cerebellar hemispheres consistent with small lacunar infarctions.  None of the ischemic changes appears to be acute.  Please call patient, MRI of the brain showed small vessel disease, also evidence of small lacunar infarction at cerebellar, there was no acute abnormalities, he should continue aspirin 81 mg daily

## 2020-12-31 NOTE — Therapy (Signed)
[facility] [address] [place], [place], [address] Phone: [phone]   Fax:  [phone]  Physical Therapy Treatment  Patient Details  Name: Javier [name] MRN: [id number] Date of Birth: 1940/10/25 Referring Provider (PT): Dr. Marcial Pacas   Encounter Date: 12/31/2020   PT End of Session - 12/31/20 1103    Visit Number 2    Number of Visits 17    Date for PT Re-Evaluation 02/28/21    Authorization Type [employer]    PT Start Time 1015    PT Stop Time 1100    PT Time Calculation (min) 45 min    Equipment Utilized During Treatment Gait belt    Activity Tolerance Patient tolerated treatment well    Behavior During Therapy [facility] for tasks assessed/performed;Flat affect           Past Medical History:  Diagnosis Date  . Diverticulosis   . Hyperlipidemia   . Hypertension   . Memory loss   . Prediabetes   . Vitamin D deficiency     Past Surgical History:  Procedure Laterality Date  . NO PAST SURGERIES      There were no vitals filed for this visit.   Subjective Assessment - 12/31/20 1017    Subjective Pt has been doing exercises at home. No falls or changes from last visit.    Patient is accompained by: Family member    How long can you sit comfortably? unlimited    How long can you stand comfortably? 15'    How long can you walk comfortably? 10'    Patient Stated Goals To retur to his prior ambulation status    Currently in Pain? No/denies              [facility] PT Assessment - 12/31/20 0001      Standardized Balance Assessment   Standardized Balance Assessment Berg Balance Test;Dynamic Gait Index      Berg Balance Test   Sit to Stand Able to stand without using hands and stabilize independently    Standing Unsupported Able to stand safely 2 minutes    Sitting with Back Unsupported but Feet Supported on Floor or Stool Able to sit safely and securely 2 minutes    Stand to Sit Controls descent by using  hands    Transfers Able to transfer safely, minor use of hands    Standing Unsupported with Eyes Closed Able to stand 10 seconds with supervision    Standing Unsupported with Feet Together Able to place feet together independently but unable to hold for 30 seconds    From Standing, Reach Forward with Outstretched Arm Can reach confidently >25 cm (10")    From Standing Position, Pick up Object from Floor Able to pick up shoe safely and easily    From Standing Position, Turn to Look Behind Over each Shoulder Looks behind one side only/other side shows less weight shift    Turn 360 Degrees Needs close supervision or verbal cueing    Standing Unsupported, Alternately Place Feet on Step/Stool Able to complete 4 steps without aid or supervision    Standing Unsupported, One Foot in Front Able to take small step independently and hold 30 seconds    Standing on One Leg Tries to lift leg/unable to hold 3 seconds but remains standing independently    Total Score 41    Berg comment: Pt uses cane indoor: 44 - 46.5 indicates significant to high fall risk  Dynamic Gait Index   Level Surface Mild Impairment    Change in Gait Speed Normal    Gait with Horizontal Head Turns Mild Impairment    Gait with Vertical Head Turns Moderate Impairment    Gait and Pivot Turn Normal    Step Over Obstacle Mild Impairment    Step Around Obstacles Mild Impairment    Steps Mild Impairment    Total Score 17    DGI comment: Scores of 19 or less are predictive of falls in older community living adults                         [facility] Adult PT Treatment/Exercise - 12/31/20 0001      Ambulation/Gait   Ambulation/Gait Yes    Ambulation/Gait Assistance 5: Supervision    Ambulation/Gait Assistance Details Educated on correct SPC height, need for endurance training; pt fatigued requiring seated rest after 1 lap in gym.    Ambulation Distance (Feet) 115 Feet    Assistive device Straight cane    Gait Pattern  Decreased arm swing - right;Decreased arm swing - left;Trendelenburg;Lateral trunk lean to left;Trunk flexed    Ambulation Surface Level                  PT Education - 12/31/20 1116    Education Details results of BERG and DGI, fall risk, and AD recommendation.    Person(s) Educated Patient;Spouse    Methods Explanation;Demonstration;Verbal cues    Comprehension Verbalized understanding;Returned demonstration            PT Short Term Goals - 12/27/20 1035      PT SHORT TERM GOAL #1   Title Demo Initial HEP back to PT w/o need of cuing    Baseline initial HEP just given    Time 4    Period Weeks    Status New    Target Date 01/24/21      PT SHORT TERM GOAL #2   Title Assess BERG and establish goals    Baseline NT    Time 4    Period Weeks    Status New    Target Date 01/24/21      PT SHORT TERM GOAL #3   Title Assess DGI/FGA and establish goals    Baseline NT    Time 4    Period Weeks    Status New    Target Date 01/24/21      PT SHORT TERM GOAL #4   Title patient to ambulate 233ft with LRAD and S    Baseline in clinic ambulation <20ft w/o AD    Time 4    Period Weeks    Target Date 01/24/21      PT SHORT TERM GOAL #5   Title Patient to perform 5x STS in <15s    Baseline 5x STS score of 19.66s    Time 4    Period Weeks    Status New    Target Date 01/24/21             PT Long Term Goals - 12/27/20 1040      PT LONG TERM GOAL #1   Title Assess BERG score    Baseline NT    Time 8    Period Weeks    Status New    Target Date 02/28/21      PT LONG TERM GOAL #2   Title assess DGI/FGA score    Baseline NT  Time 8    Period Weeks    Status New    Target Date 02/28/21      PT LONG TERM GOAL #3   Title Patient to ambulate 1054ft wth S and LRAD across outdoor surfaces    Baseline <81ft in clinic w/o AD    Time 8    Period Weeks    Status New    Target Date 02/28/21      PT LONG TERM GOAL #4   Title Patient to negotiate full  flight of stairs using reciprocal pattern and minimal need of rails    Baseline able to negotiate 2 stairs at home to access/exit house    Time 8    Period Weeks    Status New    Target Date 02/28/21                 Plan - 12/31/20 1103    Clinical Impression Statement Pt demonstrated high fall risk with Berg score of 41/56 and DGI score of 17/24. Educated Pt and wife on fall risk and recommend pt use SPC at all times during gait (indoor/outdoor) to reduce fall risk.  Pt reports having a SPC at home and is agreable to use it during gait.  Gait training with [place] instructing on correct cane height; pt demonstrated cane technique safely but fatigues quickly requiring seated rest after 162ft.    Personal Factors and Comorbidities Comorbidity 2    Comorbidities chronic lymphocytic leukemia, HTN, hyperlipidemia    Examination-Activity Limitations Locomotion Level;Stairs    Examination-Participation Restrictions Medication Management    Stability/Clinical Decision Making Evolving/Moderate complexity    Rehab Potential Good    PT Frequency 2x / week    PT Duration 8 weeks    PT Treatment/Interventions ADLs/Self Care Home Management;Aquatic Therapy;Gait training;Stair training;Therapeutic activities;Therapeutic exercise;Neuromuscular re-education;Balance training;Patient/family education    PT Next Visit Plan , follow up with HEP; hip strengthening, standing balance with head turns/nods    PT Home Exercise Plan [email or web address]    Consulted and Agree with Plan of Care Patient;Family member/caregiver    Family Member Consulted spouse           Patient will benefit from skilled therapeutic intervention in order to improve the following deficits and impairments:  Abnormal gait,Decreased coordination,Difficulty walking,Decreased endurance,Decreased activity tolerance,Decreased balance,Decreased mobility  Visit Diagnosis: Unsteadiness on feet  Muscle weakness (generalized)     Problem  List Patient Active Problem List   Diagnosis Date Noted  . Memory loss 12/20/2020  . Gait abnormality 12/20/2020  . Confusion 12/20/2020  . Burping 10/17/2020  . BMI 26.0-26.9,adult 03/06/2020  . Gastric AVM 03/06/2020  . History of colon polyps 03/06/2020  . Aortic atherosclerosis ([place]) 08/23/2019  . Memory changes 05/25/2019  . Counseling regarding advance care planning and goals of care 01/24/2019  . Anxiety 02/08/2018  . CLL (chronic lymphocytic leukemia) ([place]) 11/01/2016  . Encounter for Medicare annual wellness exam 09/11/2015  . GERD  02/09/2015  . Medication management 11/02/2013  . Hyperlipidemia   . Abnormal glucose   . Vitamin D deficiency   . Diverticulosis     Bjorn Loser, PTA  12/31/20, 11:22 AM [facility] [address] [place], [place], [address] Phone: [phone]   Fax:  [phone]  Name: Javier [name] MRN: [id number] Date of Birth: 06-22-1941

## 2021-01-01 NOTE — Telephone Encounter (Signed)
I spoke to the patient's wife on DPR. She verbalized understanding of the MRI brain findings. She will make sure that he is taking his aspirin 81mg  every day.

## 2021-01-04 ENCOUNTER — Other Ambulatory Visit: Payer: Self-pay

## 2021-01-04 ENCOUNTER — Ambulatory Visit: Payer: Medicare HMO

## 2021-01-04 DIAGNOSIS — R2689 Other abnormalities of gait and mobility: Secondary | ICD-10-CM

## 2021-01-04 DIAGNOSIS — R2681 Unsteadiness on feet: Secondary | ICD-10-CM | POA: Diagnosis not present

## 2021-01-04 DIAGNOSIS — M6281 Muscle weakness (generalized): Secondary | ICD-10-CM

## 2021-01-04 NOTE — Patient Instructions (Signed)
Added bridge, supine march and hip flexor stretch to HEP

## 2021-01-04 NOTE — Therapy (Signed)
[facility] [address] [place], [place], [address] Phone: [phone]   Fax:  [phone]  Physical Therapy Treatment  Patient Details  Name: Javier [name] MRN: [id number] Date of Birth: 1940-11-16 Referring Provider (PT): Dr. Marcial Pacas   Encounter Date: 01/04/2021   PT End of Session - 01/04/21 1108    Visit Number 3    Number of Visits 17    Date for PT Re-Evaluation 02/28/21    Authorization Type [employer]    Progress Note Due on Visit 10    PT Start Time 6962    PT Stop Time 1100    PT Time Calculation (min) 45 min    Equipment Utilized During Treatment Gait belt    Activity Tolerance Patient tolerated treatment well    Behavior During Therapy [facility] for tasks assessed/performed;Flat affect           Past Medical History:  Diagnosis Date  . Diverticulosis   . Hyperlipidemia   . Hypertension   . Memory loss   . Prediabetes   . Vitamin D deficiency     Past Surgical History:  Procedure Laterality Date  . NO PAST SURGERIES      There were no vitals filed for this visit.   Subjective Assessment - 01/04/21 1021    Subjective No pain or falls to report, reports fatigues easily and becomes SOB    Patient is accompained by: Family member    How long can you sit comfortably? unlimited    How long can you stand comfortably? 15'    How long can you walk comfortably? 10'    Patient Stated Goals To retur to his prior ambulation status    Currently in Pain? No/denies                             [facility] Adult PT Treatment/Exercise - 01/04/21 0001      Transfers   Transfers Sit to Stand    Sit to Stand 4: Min guard    Comments performed 10x w/o UE support with need ot actile cues to SW fwd      Ambulation/Gait   Ambulation/Gait Yes    Ambulation/Gait Assistance 5: Supervision;4: Min guard    Ambulation/Gait Assistance Details cue for  posture    Ambulation Distance (Feet) 115 Feet     Assistive device Straight cane    Gait Pattern Decreased arm swing - left;Trunk flexed    Ambulation Surface Level;Indoor    Gait Comments 99% O2 pre gait, 98% post      Lumbar Exercises: Supine   Bridge Non-compliant;10 reps;Limitations    Bridge Limitations 2x10    Other Supine Lumbar Exercises Supine march, alt. 2x10    Other Supine Lumbar Exercises hip flexor stretch in Thomas position      Knee/Hip Exercises: Seated   Long Arc Quad Strengthening;Both;2 sets;15 reps    [employer] Limitations performed with ball squeeze      Ankle Exercises: Standing   Heel Raises Both;15 reps;Limitations    Heel Raises Limitations 2x15 as ankle rocks with chair support                  PT Education - 01/04/21 1107    Education Details Added bridge, supine march and hip flexor stretch to HEP    Person(s) Educated Patient;Spouse    Methods Explanation;Demonstration;Tactile cues;Verbal cues;Handout    Comprehension Need further  instruction;Returned demonstration;Verbalized understanding            PT Short Term Goals - 12/27/20 1035      PT SHORT TERM GOAL #1   Title Demo Initial HEP back to PT w/o need of cuing    Baseline initial HEP just given    Time 4    Period Weeks    Status New    Target Date 01/24/21      PT SHORT TERM GOAL #2   Title Assess BERG and establish goals    Baseline NT    Time 4    Period Weeks    Status New    Target Date 01/24/21      PT SHORT TERM GOAL #3   Title Assess DGI/FGA and establish goals    Baseline NT    Time 4    Period Weeks    Status New    Target Date 01/24/21      PT SHORT TERM GOAL #4   Title patient to ambulate 2100ft with LRAD and S    Baseline in clinic ambulation <63ft w/o AD    Time 4    Period Weeks    Target Date 01/24/21      PT SHORT TERM GOAL #5   Title Patient to perform 5x STS in <15s    Baseline 5x STS score of 19.66s    Time 4    Period Weeks    Status New    Target Date 01/24/21              PT Long Term Goals - 12/27/20 1040      PT LONG TERM GOAL #1   Title Assess BERG score    Baseline NT    Time 8    Period Weeks    Status New    Target Date 02/28/21      PT LONG TERM GOAL #2   Title assess DGI/FGA score    Baseline NT    Time 8    Period Weeks    Status New    Target Date 02/28/21      PT LONG TERM GOAL #3   Title Patient to ambulate 1072ft wth S and LRAD across outdoor surfaces    Baseline <45ft in clinic w/o AD    Time 8    Period Weeks    Status New    Target Date 02/28/21      PT LONG TERM GOAL #4   Title Patient to negotiate full flight of stairs using reciprocal pattern and minimal need of rails    Baseline able to negotiate 2 stairs at home to access/exit house    Time 8    Period Weeks    Status New    Target Date 02/28/21                 Plan - 01/04/21 1109    Clinical Impression Statement Todays skilled sssion focused on LE strengthening in NWB positions due to leg tremors with prolonged standing or WB, added to supine exercise program, attempted standing on compliant surfaces bur LE tremors hindered performance, assessed O2 sats post ambulation with no decline noted.  Practiced WS fwd to place COG in correct position during STS trials.    Personal Factors and Comorbidities Comorbidity 2    Comorbidities chronic lymphocytic leukemia, HTN, hyperlipidemia    Examination-Activity Limitations Locomotion Level;Stairs    Examination-Participation Restrictions Medication Management    Stability/Clinical Decision Making Evolving/Moderate  complexity    Rehab Potential Good    PT Frequency 2x / week    PT Duration 8 weeks    PT Treatment/Interventions ADLs/Self Care Home Management;Aquatic Therapy;Gait training;Stair training;Therapeutic activities;Therapeutic exercise;Neuromuscular re-education;Balance training;Patient/family education    PT Next Visit Plan follow up with HEP; hip/knee strengthening, standing balance with head turns/nods as LE  tremors allow, encourage LE strengthening in NWB positions    PT Home Exercise Plan [email or web address]    Consulted and Agree with Plan of Care Patient;Family member/caregiver    Family Member Consulted spouse           Patient will benefit from skilled therapeutic intervention in order to improve the following deficits and impairments:  Abnormal gait,Decreased coordination,Difficulty walking,Decreased endurance,Decreased activity tolerance,Decreased balance,Decreased mobility  Visit Diagnosis: Unsteadiness on feet  Muscle weakness (generalized)  Other abnormalities of gait and mobility     Problem List Patient Active Problem List   Diagnosis Date Noted  . Memory loss 12/20/2020  . Gait abnormality 12/20/2020  . Confusion 12/20/2020  . Burping 10/17/2020  . BMI 26.0-26.9,adult 03/06/2020  . Gastric AVM 03/06/2020  . History of colon polyps 03/06/2020  . Aortic atherosclerosis ([place]) 08/23/2019  . Memory changes 05/25/2019  . Counseling regarding advance care planning and goals of care 01/24/2019  . Anxiety 02/08/2018  . CLL (chronic lymphocytic leukemia) ([place]) 11/01/2016  . Encounter for Medicare annual wellness exam 09/11/2015  . GERD  02/09/2015  . Medication management 11/02/2013  . Hyperlipidemia   . Abnormal glucose   . Vitamin D deficiency   . Diverticulosis     Lanice Shirts PT 01/04/2021, 11:23 AM  [facility] [address] [place], [place], [address] Phone: [phone]   Fax:  [phone]  Name: Javier [name] MRN: [id number] Date of Birth: 01-22-1941

## 2021-01-09 ENCOUNTER — Other Ambulatory Visit: Payer: Self-pay

## 2021-01-09 ENCOUNTER — Encounter: Payer: Self-pay | Admitting: Adult Health

## 2021-01-09 ENCOUNTER — Ambulatory Visit (INDEPENDENT_AMBULATORY_CARE_PROVIDER_SITE_OTHER): Payer: Medicare HMO | Admitting: Adult Health

## 2021-01-09 VITALS — BP 136/60 | HR 63 | Temp 97.5°F | Ht 71.0 in | Wt 174.0 lb

## 2021-01-09 DIAGNOSIS — C9110 Chronic lymphocytic leukemia of B-cell type not having achieved remission: Secondary | ICD-10-CM

## 2021-01-09 DIAGNOSIS — I440 Atrioventricular block, first degree: Secondary | ICD-10-CM

## 2021-01-09 DIAGNOSIS — K219 Gastro-esophageal reflux disease without esophagitis: Secondary | ICD-10-CM

## 2021-01-09 DIAGNOSIS — R61 Generalized hyperhidrosis: Secondary | ICD-10-CM

## 2021-01-09 DIAGNOSIS — Z0000 Encounter for general adult medical examination without abnormal findings: Secondary | ICD-10-CM | POA: Diagnosis not present

## 2021-01-09 DIAGNOSIS — E782 Mixed hyperlipidemia: Secondary | ICD-10-CM

## 2021-01-09 DIAGNOSIS — Z79899 Other long term (current) drug therapy: Secondary | ICD-10-CM

## 2021-01-09 DIAGNOSIS — Z136 Encounter for screening for cardiovascular disorders: Secondary | ICD-10-CM

## 2021-01-09 DIAGNOSIS — E559 Vitamin D deficiency, unspecified: Secondary | ICD-10-CM

## 2021-01-09 DIAGNOSIS — R7309 Other abnormal glucose: Secondary | ICD-10-CM | POA: Diagnosis not present

## 2021-01-09 DIAGNOSIS — I6789 Other cerebrovascular disease: Secondary | ICD-10-CM

## 2021-01-09 DIAGNOSIS — Z8673 Personal history of transient ischemic attack (TIA), and cerebral infarction without residual deficits: Secondary | ICD-10-CM

## 2021-01-09 DIAGNOSIS — Z0001 Encounter for general adult medical examination with abnormal findings: Secondary | ICD-10-CM

## 2021-01-09 DIAGNOSIS — Z1389 Encounter for screening for other disorder: Secondary | ICD-10-CM

## 2021-01-09 DIAGNOSIS — Z860100 Personal history of colon polyps, unspecified: Secondary | ICD-10-CM

## 2021-01-09 DIAGNOSIS — K31819 Angiodysplasia of stomach and duodenum without bleeding: Secondary | ICD-10-CM

## 2021-01-09 DIAGNOSIS — K5790 Diverticulosis of intestine, part unspecified, without perforation or abscess without bleeding: Secondary | ICD-10-CM

## 2021-01-09 DIAGNOSIS — R269 Unspecified abnormalities of gait and mobility: Secondary | ICD-10-CM

## 2021-01-09 DIAGNOSIS — R413 Other amnesia: Secondary | ICD-10-CM

## 2021-01-09 DIAGNOSIS — I700 Atherosclerosis of aorta: Secondary | ICD-10-CM

## 2021-01-09 DIAGNOSIS — N138 Other obstructive and reflux uropathy: Secondary | ICD-10-CM | POA: Insufficient documentation

## 2021-01-09 DIAGNOSIS — N401 Enlarged prostate with lower urinary tract symptoms: Secondary | ICD-10-CM

## 2021-01-09 DIAGNOSIS — Z6826 Body mass index (BMI) 26.0-26.9, adult: Secondary | ICD-10-CM

## 2021-01-09 DIAGNOSIS — F419 Anxiety disorder, unspecified: Secondary | ICD-10-CM

## 2021-01-09 DIAGNOSIS — I10 Essential (primary) hypertension: Secondary | ICD-10-CM | POA: Diagnosis not present

## 2021-01-09 DIAGNOSIS — Z87891 Personal history of nicotine dependence: Secondary | ICD-10-CM

## 2021-01-09 DIAGNOSIS — Z125 Encounter for screening for malignant neoplasm of prostate: Secondary | ICD-10-CM | POA: Diagnosis not present

## 2021-01-09 MED ORDER — ALPRAZOLAM 0.5 MG PO TABS: ORAL_TABLET | ORAL | 0 refills | Status: DC

## 2021-01-09 MED ORDER — PRAVASTATIN SODIUM 40 MG PO TABS: ORAL_TABLET | ORAL | 3 refills | Status: DC

## 2021-01-09 MED ORDER — TAMSULOSIN HCL 0.4 MG PO CAPS: 0.4000 mg | ORAL_CAPSULE | Freq: Every day | ORAL | 1 refills | Status: DC

## 2021-01-09 NOTE — Progress Notes (Signed)
CPE  Assessment:   Encounter for Annual wellness exam 1 YEAR  First degree AVB Avoid rate controlling drugs; monitor   Essential hypertension At goal; continue meds Monitor blood pressure at home; call if consistently over 130/80 Continue DASH diet.   Reminder to go to the ER if any CP, SOB, nausea, dizziness, severe HA, changes vision/speech, left arm numbness and tingling and jaw pain.  Gastroesophageal reflux disease with esophagitis Not taking meds at this time, restart PPI daily d Discussed diet, avoiding triggers and other lifestyle changes  Diverticulosis Overdue for follow up with GI Bowel management discussed,   Vitamin D deficiency At goal at recent check; continue to recommend supplementation for goal of 70-100 Check vitamin D level  Other abnormal glucose  Recent A1Cs at goal Discussed diet/exercise, weight management  Defer A1C as just had; check CMP   Medication management CBC, CMP/GFR, magnesium   Mixed hyperlipidemia Was off of pravastatin per Dr. Irene [name] due to CLL tx Restart as completed and also + CVA per recent MRI Continue low cholesterol diet and exercise.  Check lipid panel.   CLL (chronic lymphocytic leukemia) ([place]) managed by Dr. Susanne [name] x 1 year completed Monitor   Gastric AVM  Follows up GI  Emphasized adherence with recommended medications - restart PPI; monitor CBC and iron  History of colon polyps Overdue colonoscopy, referral placed back to GI  Anxiety Improved with recent zoloft dose increase; continue 100 mg/day, PRN xanax; seroquel d/c'd due to lack of benefit and excess sedation Stress management techniques discussed, increase water, good sleep hygiene discussed, increase exercise, and increase veggies.   Memory changes Labs were normal; neuro following Evidence of microvascular changes and lacunar CVA on MRI Follow up as recommended; now on bASA; will restart statin therapy   BPH/LUTS Check PSA but has been stable;  likely BPH Discussed and sent in flomax; evaluate benefit at follow up Consider finasteride  Orders Placed This Encounter  Procedures  . CBC with Differential/Platelet  . COMPLETE METABOLIC PANEL WITH GFR  . Magnesium  . Lipid panel  . TSH  . VITAMIN D 25 Hydroxy (Vit-D Deficiency, Fractures)  . PSA  . Microalbumin / creatinine urine ratio  . Urinalysis, Routine w reflex microscopic  . EKG 12-Lead   Over 30 minutes of exam, counseling, chart review, and critical decision making was performed  Future Appointments  Date Time Provider [place]  01/10/2021  9:30 AM Javier [name], RPSGT [facility] None  01/15/2021 10:15 AM Javier [name], PT [facility]  01/18/2021 11:00 AM Javier [name], PT [facility]  01/21/2021 10:15 AM Javier [name], PTA [facility]  01/22/2021 11:00 AM Javier [name], PT [facility]  01/25/2021 11:00 AM Javier [name], PT [facility]  01/28/2021 10:15 AM Javier [name], PTA [facility]  01/29/2021 10:15 AM Javier [name], PT [facility]  02/01/2021 10:15 AM Javier [name], PT [facility]  02/04/2021 10:15 AM Javier [name], PTA [facility]  02/05/2021 10:15 AM Javier [name], PT [facility]  02/08/2021 11:00 AM Javier [name], PT [facility]  02/11/2021 10:15 AM Javier [name], PTA [facility]  02/12/2021 10:15 AM Javier [name], PT [facility]  02/13/2021 10:15 AM Javier [name], PT [facility]  02/15/2021 11:30 AM [facility] None  02/15/2021 12:00 PM Javier Genera, MD [facility] None  01/09/2022  3:00 PM Javier Comber, NP [facility] None     Plan:   During the course of the  visit the patient was educated and counseled about appropriate screening and preventive services including:    Pneumococcal vaccine   Influenza vaccine  Prevnar 13  Td vaccine  Screening electrocardiogram  Colorectal cancer screening  Diabetes screening  Glaucoma  screening  Nutrition counseling    Subjective:  Javier [name] is a 80 y.o. male who presents for CPE. He has Hyperlipidemia; Hypertension; Abnormal glucose; Vitamin D deficiency; Diverticulosis; Medication management; GERD ; Encounter for Medicare annual wellness exam; CLL (chronic lymphocytic leukemia) ([place]); Anxiety; Counseling regarding advance care planning and goals of care; Memory changes; Aortic atherosclerosis ([place]); BMI 26.0-26.9,adult; Gastric AVM; History of colon polyps; Gait abnormality; Confusion; History of lacunar cerebrovascular accident; and Cerebral microvascular disease on their problem list.  [name] is with him today.   He is followed by Dr Javier [name] for smouldering CLL, monitoring only after completed 1 year of venetoclax.   He underwent EGD 01/26/2020 by Dr. Earlean [name] found bleeding gastric AVM, prescribed omeprazole 40 mg BID but reports taking PRN only, denies notable GERD.   He has reported progressive memory changes since 04/2019, "slow", had normal labs and was referred to neuro but never went, worsening memory with bizarre behavior and nocturnal confusion, some question of hallucinations and was referred to Dr. Krista [name]. He had unremarkable lab workup and MRI brain 11/2020 showed chronic microvascular ischemic change and small foci in the cerebellar hemispheres consistent with small chronic lacunar infarctions. He was recommended 81 mg EC ASA. EEG is also scheduled for later this month, has follow up tomorrow.   He also recently initiated PT for unsteady gait.   he has a diagnosis of anxiety and is currently on zoloft 100 mg (recently increased by neuro), does note improvement in night time anxiety and sweats with dose increase,  xanax 0.25 mg PRN (typically 1-2 times per day), also recently on seroquel 50 mg TID but caused excess sedation and stopped, never perceived benefit.   BMI is Body mass index is 24.27 kg/m., he has been working on diet, takes short walks ~5 min 4  times daily, also working in yard [employer] from Last 3 Encounters:  01/09/21 174 lb (78.9 kg)  12/20/20 167 lb 8 oz (76 kg)  11/21/20 178 lb 3.2 oz (80.8 kg)   His blood pressure has been controlled at home, today their BP is BP: 136/60  He does workout. He denies chest pain, shortness of breath, dizziness.   He is on cholesterol medication (has been off of pravastatin while taking venclexa per Dr. Irene [name]) and denies myalgias. His cholesterol is not at goal. The cholesterol last visit was:   Lab Results  Component Value Date   CHOL 208 (H) 09/10/2020   HDL 44 09/10/2020   LDLCALC 143 (H) 09/10/2020   TRIG 98 09/10/2020   CHOLHDL 4.7 09/10/2020   He has been working on diet and exercise for glucose management, and denies foot ulcerations, increased appetite, nausea, paresthesia of the feet, polydipsia, polyuria, visual disturbances, vomiting and weight loss. Last A1C in the office was:  Lab Results  Component Value Date   HGBA1C 5.6 12/20/2020   Last GFR Lab Results  Component Value Date   GFRAA 74 09/10/2020   Patient is on Vitamin D supplement.   Lab Results  Component Value Date   VD25OH 83 06/08/2020     He reports slow onset of urine stream, does report 3-4 times of nocturia per night; denies straining, dribbling, incontinence   Lab Results  Component Value  Date   PSA 2.5 12/06/2019   PSA 2.0 11/05/2017   PSA 2.01 09/11/2015       Medication Review:   Current Outpatient Medications (Cardiovascular):  .  losartan-hydrochlorothiazide (HYZAAR) 100-25 MG tablet, Take  1 tablet  Daily  for BP & Fluid Retention /Ankle Swelling  Current Outpatient Medications (Respiratory):  .  triamcinolone (NASACORT) 55 MCG/ACT AERO nasal inhaler, Place 2 sprays into the nose at bedtime. (Patient taking differently: Place 2 sprays into the nose as needed.)  Current Outpatient Medications (Analgesics):  .  aspirin EC 81 MG tablet, Take 81 mg by mouth daily.   Current Outpatient  Medications (Other):  Marland Kitchen  ALPRAZolam (XANAX) 0.5 MG tablet, Take  1/2 -1 tablet  2-3 x /day  ONLY  if needed for Anxiety Attack &  limit to 5 days /week to avoid Addiction & Dementia (Must last 6 weeks til April 10th) .  Alum & Mag Hydroxide-Simeth (MYLANTA PO), Take by mouth as needed. .  Cholecalciferol (VITAMIN D3) 5000 units TABS, Take 5,000 Units by mouth daily. Marland Kitchen  omeprazole (PRILOSEC) 40 MG capsule, Take 1 capsule 2 x /day  for Indigestion & Burning .  sertraline (ZOLOFT) 50 MG tablet, Take 2 tablets (100 mg total) by mouth daily. (Patient not taking: Reported on 01/09/2021) .  sucralfate (CARAFATE) 1 g tablet, Take  1 tablet  4 x/day  before Meals & Bedtime  for Stomach Burning (Patient not taking: Reported on 01/09/2021)  Allergies: Allergies  Allergen Reactions  . Penicillins Shortness Of Breath, Swelling and Other (See Comments)    Unknown allergic reaction as a teenager  . Ppd [Tuberculin Purified Protein Derivative]     Positive test in 2004    Current Problems (verified) has Hyperlipidemia; Hypertension; Abnormal glucose; Vitamin D deficiency; Diverticulosis; Medication management; GERD ; Encounter for Medicare annual wellness exam; CLL (chronic lymphocytic leukemia) ([place]); Anxiety; Counseling regarding advance care planning and goals of care; Memory changes; Aortic atherosclerosis ([place]); BMI 26.0-26.9,adult; Gastric AVM; History of colon polyps; Gait abnormality; Confusion; History of lacunar cerebrovascular accident; and Cerebral microvascular disease on their problem list.  Screening Tests Immunization History  Administered Date(s) Administered  . Influenza Split 07/28/2013  . Influenza, High Dose Seasonal PF 09/11/2015, 06/09/2016, 09/15/2018  . Pneumococcal Conjugate-13 11/05/2017  . Pneumococcal-Unspecified 07/01/1999, 09/20/2009  . Td 09/20/2009  . Zoster 11/08/2009   Preventative care: Last colonoscopy: 2015, Dr. Deatra Ina, polyps, 3 year follow up  was dismissed from  [place], now established with Dr. Earlean [name] EGD 2018, 12/2019, Dr. Earlean [name], bleeding gastric AVM  PET 11/2018 MRI brain 12/28/2020 - microvascular changes and lacunar infarcts  Prior vaccinations: TD or Tdap: 2010 will boost PRN Influenza: 2021 Pneumococcal: 2010 Prevnar13: 2019 Shingles/Zostavax: 2011 Covid 19: 3/3, 2021, pfizer   Names of Other Physician/Practitioners you currently use: 1. [facility] here for primary care 2. Dr. Delman Cheadle, eye doctor, last visit 2021, glasses, [facility] 3. Dr. Wynetta Emery, dentist, last visit 2021, q6-29m  Patient Care Team: Unk Pinto, MD as PCP - General (Internal Medicine) Inda Castle, MD (Inactive) as Consulting Physician (Gastroenterology) Melissa Noon, [place] as Referring Physician (Optometry) Danis, Kirke Corin, MD as Consulting Physician (Gastroenterology) Javier Genera, MD as Consulting Physician (Hematology)  Surgical: He  has a past surgical history that includes No past surgeries. Family His family history includes Cirrhosis in his brother; Colon cancer in his paternal grandfather; Diabetes in his brother; Early death in his father; Stroke  in his mother. Social history  He reports that he quit smoking about 17 years ago. His smoking use included cigarettes. He has never used smokeless tobacco. He reports previous alcohol use. He reports that he does not use drugs.  Depression/mood screen:   Depression screen [facility] 2/9 01/09/2021  Decreased Interest 0  Down, Depressed, Hopeless 0  PHQ - 2 Score 0    MMSE - Mini Mental State Exam 12/20/2020 05/25/2019  Orientation to time 5 5  Orientation to Place 5 5  Registration 2 3  Attention/ Calculation 5 5  Recall 3 2  Language- name 2 objects 2 2  Language- repeat 1 1  Language- follow 3 step command 3 3  Language- read & follow direction 1 1  Write a sentence 1 1  Copy design 0 1  Total score 28 29     Review of Systems   Constitutional: Positive for diaphoresis (night time/early morning, improved). Negative for malaise/fatigue and weight loss.  HENT: Negative for hearing loss and tinnitus.   Eyes: Negative for blurred vision and double vision.  Respiratory: Negative for cough, shortness of breath and wheezing.   Cardiovascular: Negative for chest pain, palpitations, orthopnea, claudication and leg swelling.  Gastrointestinal: Negative for abdominal pain, blood in stool, constipation, diarrhea, heartburn, melena, nausea and vomiting.  Genitourinary: Negative.  Negative for dysuria, flank pain, frequency, hematuria and urgency.       Nocturia 3-4 episodes, slow stream  Musculoskeletal: Negative for joint pain and myalgias.  Skin: Negative for rash.  Neurological: Negative for dizziness, tingling, sensory change, weakness and headaches.  Endo/Heme/Allergies: Negative for polydipsia.  Psychiatric/Behavioral: Positive for memory loss. Negative for depression, substance abuse and suicidal ideas. The patient is nervous/anxious. The patient does not have insomnia.   All other systems reviewed and are negative.   Objective:   Today's Vitals   01/09/21 1456  BP: 136/60  Pulse: 63  Temp: (!) 97.5 F (36.4 C)  SpO2: 97%  Weight: 174 lb (78.9 kg)  Height: 5\' 11"  (1.803 m)   Body mass index is 24.27 kg/m.   General Appearance: Well nourished and well groomed and in no apparent distress. Eyes: PERRLA, EOMs, conjunctiva no swelling or erythema Sinuses: No frontal/maxillary tenderness ENT/Mouth: EACs patent / TMs  nl. Nares clear without erythema, swelling, mucoid exudates. Oral hygiene is good. No erythema, swelling, or exudate. Tongue normal, non-obstructing. Tonsils not swollen or erythematous. Hearing normal.  Neck: Supple, thyroid normal. No bruits, nodes or JVD. Respiratory: Respiratory effort normal.  BS equal and clear bilateral without rales, rhonci, wheezing or stridor. Cardio: Heart sounds are normal  with regular rate and rhythm and no murmurs, rubs or gallops. Peripheral pulses are normal and equal bilaterally without edema. No aortic or femoral bruits. Chest: symmetric with normal excursions and percussion.  Abdomen: Soft, with Nl bowel sounds. Nontender, no guarding, rebound, hernias, masses, or organomegaly.  Lymphatics: Non tender without lymphadenopathy.  Genitourinary: Defer Musculoskeletal: Full ROM all peripheral extremities, joint stability, 5/5 strength, and slow steady gait. .  Skin: Warm and dry without rashes, lesions, cyanosis, clubbing or  ecchymosis.  Neuro: Cranial nerves intact, reflexes equal bilaterally. Normal muscle tone, no cerebellar symptoms. Sensation intact.  Pysch: Alert and oriented X 3 with normal affect, insight and judgment appropriate.   EKG: Sinus brady, 1st degree AVB  Medicare Attestation I have personally reviewed: The patient's medical and social history Their use of alcohol, tobacco or illicit drugs Their current medications and supplements The patient's functional  ability including ADLs,fall risks, home safety risks, cognitive, and hearing and visual impairment Diet and physical activities Evidence for depression or mood disorders  The patient's weight, height, BMI, and visual acuity have been recorded in the chart.  I have made referrals, counseling, and provided education to the patient based on review of the above and I have provided the patient with a written personalized care plan for preventive services.     Izora Ribas, NP   01/09/2021

## 2021-01-09 NOTE — Patient Instructions (Addendum)
  Javier [name] , Thank you for taking time to come for your Medicare Wellness Visit. I appreciate your ongoing commitment to your health goals. Please review the following plan we discussed and let me know if I can assist you in the future.   These are the goals we discussed: Goals    . Blood Pressure < 140/90       This is a list of the screening recommended for you and due dates:  Health Maintenance  Topic Date Due  . COVID-19 Vaccine (1) Never done  . Colon Cancer Screening  05/22/2017  . Flu Shot  04/29/2021  . Tetanus Vaccine  09/30/2023  .  Hepatitis C: One time screening is recommended by Center for Disease Control  (CDC) for  adults born from [birth year] through 1965.   Completed  . Pneumonia vaccines  Completed  . HPV Vaccine  Aged Out      Please send covid 19 card information   Please get chest ray at [facility] [address]    Know what a healthy weight is for you (roughly BMI <25) and aim to maintain this  Aim for 7+ servings of fruits and vegetables daily  65-80+ fluid ounces of water or unsweet tea for healthy kidneys  Limit to max 1 drink of alcohol per day; avoid smoking/tobacco  Limit animal fats in diet for cholesterol and heart health - choose grass fed whenever available  Avoid highly processed foods, and foods high in saturated/trans fats  Aim for low stress - take time to unwind and care for your mental health  Aim for 150 min of moderate intensity exercise weekly for heart health, and weights twice weekly for bone health  Aim for 7-9 hours of sleep daily

## 2021-01-10 ENCOUNTER — Ambulatory Visit: Payer: Medicare HMO | Admitting: Neurology

## 2021-01-10 DIAGNOSIS — R269 Unspecified abnormalities of gait and mobility: Secondary | ICD-10-CM

## 2021-01-10 DIAGNOSIS — R410 Disorientation, unspecified: Secondary | ICD-10-CM

## 2021-01-10 DIAGNOSIS — F419 Anxiety disorder, unspecified: Secondary | ICD-10-CM | POA: Diagnosis not present

## 2021-01-10 DIAGNOSIS — I700 Atherosclerosis of aorta: Secondary | ICD-10-CM | POA: Diagnosis not present

## 2021-01-10 DIAGNOSIS — R413 Other amnesia: Secondary | ICD-10-CM

## 2021-01-10 DIAGNOSIS — E782 Mixed hyperlipidemia: Secondary | ICD-10-CM | POA: Diagnosis not present

## 2021-01-10 DIAGNOSIS — E559 Vitamin D deficiency, unspecified: Secondary | ICD-10-CM | POA: Diagnosis not present

## 2021-01-10 DIAGNOSIS — Z0001 Encounter for general adult medical examination with abnormal findings: Secondary | ICD-10-CM | POA: Diagnosis not present

## 2021-01-10 DIAGNOSIS — I10 Essential (primary) hypertension: Secondary | ICD-10-CM | POA: Diagnosis not present

## 2021-01-10 DIAGNOSIS — Z79899 Other long term (current) drug therapy: Secondary | ICD-10-CM | POA: Diagnosis not present

## 2021-01-10 DIAGNOSIS — R7309 Other abnormal glucose: Secondary | ICD-10-CM | POA: Diagnosis not present

## 2021-01-10 DIAGNOSIS — C9110 Chronic lymphocytic leukemia of B-cell type not having achieved remission: Secondary | ICD-10-CM | POA: Diagnosis not present

## 2021-01-10 LAB — LIPID PANEL
Cholesterol: 202 mg/dL — ABNORMAL HIGH (ref <200)
HDL: 48 mg/dL (ref >=40)
LDL Cholesterol (Calc): 135 mg/dL (calc) — ABNORMAL HIGH
Non-HDL Cholesterol (Calc): 154 mg/dL (calc) — ABNORMAL HIGH (ref <130)
Total CHOL/HDL Ratio: 4.2 (calc) (ref <5.0)
Triglycerides: 91 mg/dL (ref <150)

## 2021-01-10 LAB — CBC WITH DIFFERENTIAL/PLATELET
Absolute Monocytes: 455 cells/uL (ref 200–950)
Basophils Absolute: 51 cells/uL (ref 0–200)
Basophils Relative: 1.1 %
Eosinophils Absolute: 28 cells/uL (ref 15–500)
Eosinophils Relative: 0.6 %
HCT: 38.9 % (ref 38.5–50.0)
Hemoglobin: 12.5 g/dL — ABNORMAL LOW (ref 13.2–17.1)
Lymphs Abs: 1389 cells/uL (ref 850–3900)
MCH: 30 pg (ref 27.0–33.0)
MCHC: 32.1 g/dL (ref 32.0–36.0)
MCV: 93.5 fL (ref 80.0–100.0)
MPV: 11.7 fL (ref 7.5–12.5)
Monocytes Relative: 9.9 %
Neutro Abs: 2677 cells/uL (ref 1500–7800)
Neutrophils Relative %: 58.2 %
Platelets: 158 10*3/uL (ref 140–400)
RBC: 4.16 10*6/uL — ABNORMAL LOW (ref 4.20–5.80)
RDW: 12.7 % (ref 11.0–15.0)
Total Lymphocyte: 30.2 %
WBC: 4.6 10*3/uL (ref 3.8–10.8)

## 2021-01-10 LAB — COMPLETE METABOLIC PANEL WITH GFR
AG Ratio: 1.9 (calc) (ref 1.0–2.5)
ALT: 5 U/L — ABNORMAL LOW (ref 9–46)
AST: 10 U/L (ref 10–35)
Albumin: 4 g/dL (ref 3.6–5.1)
Alkaline phosphatase (APISO): 60 U/L (ref 35–144)
BUN: 16 mg/dL (ref 7–25)
CO2: 26 mmol/L (ref 20–32)
Calcium: 9.9 mg/dL (ref 8.6–10.3)
Chloride: 109 mmol/L (ref 98–110)
Creat: 0.98 mg/dL (ref 0.70–1.18)
GFR, Est African American: 85 mL/min/{1.73_m2} (ref >=60)
GFR, Est Non African American: 73 mL/min/{1.73_m2} (ref >=60)
Globulin: 2.1 g/dL (calc) (ref 1.9–3.7)
Glucose, Bld: 89 mg/dL (ref 65–99)
Potassium: 4.2 mmol/L (ref 3.5–5.3)
Sodium: 143 mmol/L (ref 135–146)
Total Bilirubin: 0.4 mg/dL (ref 0.2–1.2)
Total Protein: 6.1 g/dL (ref 6.1–8.1)

## 2021-01-10 LAB — TSH: TSH: 0.89 mIU/L (ref 0.40–4.50)

## 2021-01-10 LAB — VITAMIN D 25 HYDROXY (VIT D DEFICIENCY, FRACTURES): Vit D, 25-Hydroxy: 58 ng/mL (ref 30–100)

## 2021-01-10 LAB — MAGNESIUM: Magnesium: 2.2 mg/dL (ref 1.5–2.5)

## 2021-01-10 LAB — MICROALBUMIN / CREATININE URINE RATIO
Creatinine, Urine: 207 mg/dL (ref 20–320)
Microalb Creat Ratio: 4 mcg/mg creat (ref <30)
Microalb, Ur: 0.8 mg/dL

## 2021-01-10 LAB — PSA: PSA: 2.87 ng/mL (ref <=4.0)

## 2021-01-11 LAB — URINALYSIS, ROUTINE W REFLEX MICROSCOPIC
Bilirubin Urine: NEGATIVE
Glucose, UA: NEGATIVE
Hgb urine dipstick: NEGATIVE
Ketones, ur: NEGATIVE
Leukocytes,Ua: NEGATIVE
Nitrite: NEGATIVE
Protein, ur: NEGATIVE
Specific Gravity, Urine: 1.013 (ref 1.001–1.03)
pH: 6 (ref 5.0–8.0)

## 2021-01-14 ENCOUNTER — Ambulatory Visit: Payer: Medicare HMO | Admitting: Physical Therapy

## 2021-01-15 ENCOUNTER — Ambulatory Visit: Payer: Medicare HMO

## 2021-01-15 ENCOUNTER — Other Ambulatory Visit: Payer: Self-pay

## 2021-01-15 DIAGNOSIS — M6281 Muscle weakness (generalized): Secondary | ICD-10-CM | POA: Diagnosis not present

## 2021-01-15 DIAGNOSIS — R2689 Other abnormalities of gait and mobility: Secondary | ICD-10-CM | POA: Diagnosis not present

## 2021-01-15 DIAGNOSIS — R2681 Unsteadiness on feet: Secondary | ICD-10-CM | POA: Diagnosis not present

## 2021-01-15 NOTE — Therapy (Signed)
[facility] [address] [place], [place], [address] Phone: [phone]   Fax:  [phone]  Physical Therapy Treatment  Patient Details  Name: Javier [name] MRN: [id number] Date of Birth: 1940-10-29 Referring Provider (PT): Dr. Marcial Pacas   Encounter Date: 01/15/2021   PT End of Session - 01/15/21 1031    Visit Number 4    Number of Visits 17    Date for PT Re-Evaluation 02/28/21    Authorization Type [employer]    Progress Note Due on Visit 10    PT Start Time 3845    PT Stop Time 1100    PT Time Calculation (min) 45 min    Equipment Utilized During Treatment Gait belt    Activity Tolerance Patient tolerated treatment well    Behavior During Therapy [facility] for tasks assessed/performed;Flat affect           Past Medical History:  Diagnosis Date  . Diverticulosis   . Hyperlipidemia   . Hypertension   . Memory loss   . Prediabetes   . Vitamin D deficiency     Past Surgical History:  Procedure Laterality Date  . NO PAST SURGERIES      There were no vitals filed for this visit.   Subjective Assessment - 01/15/21 1018    Subjective LE termors still present but better, had MD appointment and stomach meds changed, did not discuss LE tremors with NP    Patient is accompained by: Family member    How long can you sit comfortably? unlimited    How long can you stand comfortably? 15'    How long can you walk comfortably? 10'    Patient Stated Goals To retur to his prior ambulation status                             [facility] Adult PT Treatment/Exercise - 01/15/21 0001      Transfers   Transfers Sit to Stand    Sit to Stand 4: Min guard    Comments 10x from [employer]      Ambulation/Gait   Ambulation/Gait Yes    Ambulation/Gait Assistance 5: Supervision;4: Min guard    Ambulation/Gait Assistance Details cues for posture and safety awareness    Ambulation Distance (Feet) 115 Feet    Assistive  device Straight cane    Gait Pattern Decreased arm swing - left;Trunk flexed    Ambulation Surface Level;Indoor      Lumbar Exercises: Supine   Bridge Non-compliant;10 reps    Bridge Limitations 2x10    Other Supine Lumbar Exercises supinre marching, alt. 2x10 with 1# wt.    Other Supine Lumbar Exercises hooklie clamshells, 2x01 against red band      Knee/Hip Exercises: Seated   Long Arc Quad Strengthening;Both;2 sets;10 reps;Limitations;Weights    Long Arc Quad Weight 1 lbs.    Long Arc Quad Limitations alternating    Heel Slides Strengthening;Both;2 sets;10 reps;Limitations    Heel Slides Limitations 1# wt.    Marching Strengthening;Both;2 sets;10 reps;Weights;Limitations    Marching Weights 1 lbs.      Ankle Exercises: Standing   Heel Raises Both;15 reps    Heel Raises Limitations performed at counter with UE support    Toe Raise 15 reps;Limitations                    PT Short Term Goals - 01/15/21 1158  PT SHORT TERM GOAL #1   Title Demo Initial HEP back to PT w/o need of cuing    Baseline initial HEP just given; 01/15/21 HEP updated    Time 4    Period Weeks    Status New    Target Date 01/24/21      PT SHORT TERM GOAL #2   Title Assess BERG and establish goals    Baseline NT; 01/15/21 BERG score 41, goal is 49    Time 4    Period Weeks    Status Achieved    Target Date 01/24/21      PT SHORT TERM GOAL #3   Title Assess DGI/FGA and establish goals    Baseline NT; DGI score 17, goal is 21    Time 4    Period Weeks    Status Achieved    Target Date 01/24/21      PT SHORT TERM GOAL #4   Title patient to ambulate 219ft with LRAD and S    Baseline in clinic ambulation <50ft w/o AD    Time 4    Period Weeks    Target Date 01/24/21      PT SHORT TERM GOAL #5   Title Patient to perform 5x STS in <15s    Baseline 5x STS score of 19.66s    Time 4    Period Weeks    Status New    Target Date 01/24/21             PT Long Term Goals -  12/27/20 1040      PT LONG TERM GOAL #1   Title Assess BERG score    Baseline NT    Time 8    Period Weeks    Status New    Target Date 02/28/21      PT LONG TERM GOAL #2   Title assess DGI/FGA score    Baseline NT    Time 8    Period Weeks    Status New    Target Date 02/28/21      PT LONG TERM GOAL #3   Title Patient to ambulate 1079ft wth S and LRAD across outdoor surfaces    Baseline <6ft in clinic w/o AD    Time 8    Period Weeks    Status New    Target Date 02/28/21      PT LONG TERM GOAL #4   Title Patient to negotiate full flight of stairs using reciprocal pattern and minimal need of rails    Baseline able to negotiate 2 stairs at home to access/exit house    Time 8    Period Weeks    Status New    Target Date 02/28/21                 Plan - 01/15/21 1153    Clinical Impression Statement Todays skilled session included LE strengh in supine position, gait and transfer training emphasizing proper form and body mechanics, additions to HEP.  Continued reports of weakness following short bouts of acviity as well as onset of UE/LE tremors creating a safety issue when ambulating. Some impulsive behavior noted when performing STS transfers as evidenced by por safety awareness and bracing of LEs against mat table.    Personal Factors and Comorbidities Comorbidity 2    Comorbidities chronic lymphocytic leukemia, HTN, hyperlipidemia    Examination-Activity Limitations Locomotion Level;Stairs    Examination-Participation Restrictions Medication Management    Stability/Clinical Decision Making Evolving/Moderate  complexity    Rehab Potential Good    PT Frequency 2x / week    PT Duration 8 weeks    PT Treatment/Interventions ADLs/Self Care Home Management;Aquatic Therapy;Gait training;Stair training;Therapeutic activities;Therapeutic exercise;Neuromuscular re-education;Balance training;Patient/family education    PT Next Visit Plan hip/knee strengthening, standing  balance with head turns/nods as LE tremors allow, encourage LE strengthening in NWB positions, monitor impulsiveness and safety awareness    PT Home Exercise Plan [id number]    Consulted and Agree with Plan of Care Patient;Family member/caregiver    Family Member Consulted spouse           Patient will benefit from skilled therapeutic intervention in order to improve the following deficits and impairments:  Abnormal gait,Decreased coordination,Difficulty walking,Decreased endurance,Decreased activity tolerance,Decreased balance,Decreased mobility  Visit Diagnosis: Unsteadiness on feet  Muscle weakness (generalized)  Other abnormalities of gait and mobility     Problem List Patient Active Problem List   Diagnosis Date Noted  . History of lacunar cerebrovascular accident 01/09/2021  . 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  . BPH with obstruction/lower urinary tract symptoms 01/09/2021  . First degree AV block 01/09/2021  . Gait abnormality 12/20/2020  . Confusion 12/20/2020  . BMI 26.0-26.9,adult 03/06/2020  . Gastric AVM 03/06/2020  . History of colon polyps 03/06/2020  . Aortic atherosclerosis ([place]) 08/23/2019  . Memory changes 05/25/2019  . Counseling regarding advance care planning and goals of care 01/24/2019  . Anxiety 02/08/2018  . CLL (chronic lymphocytic leukemia) ([place]) 11/01/2016  . Encounter for Medicare annual wellness exam 09/11/2015  . GERD  02/09/2015  . Medication management 11/02/2013  . Hyperlipidemia   . Hypertension   . Abnormal glucose   . Vitamin D deficiency   . Diverticulosis     Lanice Shirts PT 01/15/2021, 12:04 PM  [facility] [address] [place], [place], [address] Phone: [phone]   Fax:  [phone]  Name: MORRELL FLUKE MRN: [id number] Date of Birth: 1941/06/23

## 2021-01-15 NOTE — Procedures (Signed)
   HISTORY: 80 year old male, with history of memory loss, episode of early morning agitation excessive sweat  TECHNIQUE:  This is a routine 16 channel EEG recording with one channel devoted to a limited EKG recording.  It was performed during wakefulness, drowsiness and asleep.  Hyperventilation and photic stimulation were performed as activating procedures.  There are minimum muscle and movement artifact noted.  Upon maximum arousal, posterior dominant waking rhythm consistent of rhythmic alpha range activity, with frequency of 10 hz. Activities are symmetric over the bilateral posterior derivations and attenuated with eye opening.  Hyperventilation produced mild/moderate buildup with higher amplitude and the slower activities noted.  Photic stimulation did not alter the tracing.  During EEG recording, patient developed drowsiness and no deeper stage of sleep was achieved  During EEG recording, there was no epileptiform discharge noted.  EKG demonstrate sinus rhythm, with heart rate of 72 bpm  CONCLUSION: This is a  normal awake EEG.  There is no electrodiagnostic evidence of epileptiform discharge.  Marcial Pacas, M.D. Ph.D.  [facility] [place], [place] [address] Phone: [phone] Fax:      [phone]

## 2021-01-18 ENCOUNTER — Ambulatory Visit: Payer: Medicare HMO

## 2021-01-18 ENCOUNTER — Other Ambulatory Visit: Payer: Self-pay

## 2021-01-18 DIAGNOSIS — R2689 Other abnormalities of gait and mobility: Secondary | ICD-10-CM | POA: Diagnosis not present

## 2021-01-18 DIAGNOSIS — R2681 Unsteadiness on feet: Secondary | ICD-10-CM

## 2021-01-18 DIAGNOSIS — M6281 Muscle weakness (generalized): Secondary | ICD-10-CM | POA: Diagnosis not present

## 2021-01-18 NOTE — Therapy (Signed)
[facility] [address] [place], [place], [address] Phone: [phone]   Fax:  [phone]  Physical Therapy Treatment  Patient Details  Name: Javier [name] MRN: [id number] Date of Birth: [birth year] Referring Provider (PT): Dr. Marcial Pacas   Encounter Date: 01/18/2021   PT End of Session - 01/18/21 1148    Visit Number 5    Number of Visits 17    Date for PT Re-Evaluation 02/28/21    Authorization Type [employer]    Progress Note Due on Visit 10    PT Start Time 1100    PT Stop Time 1145    PT Time Calculation (min) 45 min    Equipment Utilized During Treatment Gait belt    Activity Tolerance Patient tolerated treatment well    Behavior During Therapy [facility] for tasks assessed/performed;Flat affect           Past Medical History:  Diagnosis Date  . Diverticulosis   . Hyperlipidemia   . Hypertension   . Memory loss   . Prediabetes   . Vitamin D deficiency     Past Surgical History:  Procedure Laterality Date  . NO PAST SURGERIES      There were no vitals filed for this visit.   Subjective Assessment - 01/18/21 1108    Subjective no falls to note LE tremors improving, brain scan were negative    How long can you sit comfortably? unlimited    How long can you stand comfortably? 15'    How long can you walk comfortably? 10'    Patient Stated Goals To return to his prior ambulation status                             [facility] Adult PT Treatment/Exercise - 01/18/21 0001      Ambulation/Gait   Ambulation/Gait Yes    Ambulation/Gait Assistance 5: Supervision;4: Min guard    Ambulation/Gait Assistance Details postural cues    Assistive device Straight cane    Gait Pattern Decreased arm swing - left    Ambulation Surface Level;Indoor    Gait Comments O2 stas and HR stable, c/o fatigue onset at 27ft      Lumbar Exercises: Supine   Bridge with [employer] Squeeze 10 reps;Limitations    Bridge with  [employer] Limitations 2x10 with 1.# ball    Other Supine Lumbar Exercises alt supine march with 2#, 2x10    Other Supine Lumbar Exercises hooklie clamshell withred band      Knee/Hip Exercises: Seated   Long Arc Quad Strengthening;Both;2 sets;10 reps;Limitations    Long Arc Quad Weight 2 lbs.    Long [employer] Limitations with ball squeeze    Heel Slides Strengthening;Both;2 sets;10 reps    Heel Slides Limitations green band                    PT Short Term Goals - 01/15/21 1158      PT SHORT TERM GOAL #1   Title Demo Initial HEP back to PT w/o need of cuing    Baseline initial HEP just given; 01/15/21 HEP updated    Time 4    Period Weeks    Status New    Target Date 01/24/21      PT SHORT TERM GOAL #2   Title Assess BERG and establish goals    Baseline NT; 01/15/21 BERG score 41, goal is 49  Time 4    Period Weeks    Status Achieved    Target Date 01/24/21      PT SHORT TERM GOAL #3   Title Assess DGI/FGA and establish goals    Baseline NT; DGI score 17, goal is 21    Time 4    Period Weeks    Status Achieved    Target Date 01/24/21      PT SHORT TERM GOAL #4   Title patient to ambulate 247ft with LRAD and S    Baseline in clinic ambulation <1ft w/o AD    Time 4    Period Weeks    Target Date 01/24/21      PT SHORT TERM GOAL #5   Title Patient to perform 5x STS in <15s    Baseline 5x STS score of 19.66s    Time 4    Period Weeks    Status New    Target Date 01/24/21             PT Long Term Goals - 12/27/20 1040      PT LONG TERM GOAL #1   Title Assess BERG score    Baseline NT    Time 8    Period Weeks    Status New    Target Date 02/28/21      PT LONG TERM GOAL #2   Title assess DGI/FGA score    Baseline NT    Time 8    Period Weeks    Status New    Target Date 02/28/21      PT LONG TERM GOAL #3   Title Patient to ambulate 1028ft wth S and LRAD across outdoor surfaces    Baseline <59ft in clinic w/o AD    Time 8     Period Weeks    Status New    Target Date 02/28/21      PT LONG TERM GOAL #4   Title Patient to negotiate full flight of stairs using reciprocal pattern and minimal need of rails    Baseline able to negotiate 2 stairs at home to access/exit house    Time 8    Period Weeks    Status New    Target Date 02/28/21                 Plan - 01/18/21 1149    Clinical Impression Statement Todays skilled session included continued strengthening with increased resistance, review of STS tasks wih emphasis on proper form and safey awareness, gait training with cane with fatigue onset reported at 48ft.  Impulsive behavior less and patient more emgaged in session today and able to count reps    Personal Factors and Comorbidities Comorbidity 2    Comorbidities chronic lymphocytic leukemia, HTN, hyperlipidemia    Examination-Activity Limitations Locomotion Level;Stairs    Examination-Participation Restrictions Medication Management    Stability/Clinical Decision Making Evolving/Moderate complexity    Rehab Potential Good    PT Frequency 2x / week    PT Duration 8 weeks    PT Treatment/Interventions ADLs/Self Care Home Management;Aquatic Therapy;Gait training;Stair training;Therapeutic activities;Therapeutic exercise;Neuromuscular re-education;Balance training;Patient/family education    PT Next Visit Plan hip/knee strengthening, standing balance with head turns/nods, encourage LE strengthening in NWB positions, monitor impulsiveness and safety awareness, assess point at which fatigue reported    PT Home Exercise Plan [email or web address]    Consulted and Agree with Plan of Care Patient;Family member/caregiver    Family Member Consulted spouse  Patient will benefit from skilled therapeutic intervention in order to improve the following deficits and impairments:  Abnormal gait,Decreased coordination,Difficulty walking,Decreased endurance,Decreased activity tolerance,Decreased balance,Decreased  mobility  Visit Diagnosis: Unsteadiness on feet  Muscle weakness (generalized)  Other abnormalities of gait and mobility     Problem List Patient Active Problem List   Diagnosis Date Noted  . History of lacunar cerebrovascular accident 01/09/2021  . 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  . BPH with obstruction/lower urinary tract symptoms 01/09/2021  . First degree AV block 01/09/2021  . Gait abnormality 12/20/2020  . Confusion 12/20/2020  . BMI 26.0-26.9,adult 03/06/2020  . Gastric AVM 03/06/2020  . History of colon polyps 03/06/2020  . Aortic atherosclerosis ([place]) 08/23/2019  . Memory changes 05/25/2019  . Counseling regarding advance care planning and goals of care 01/24/2019  . Anxiety 02/08/2018  . CLL (chronic lymphocytic leukemia) ([place]) 11/01/2016  . Encounter for Medicare annual wellness exam 09/11/2015  . GERD  02/09/2015  . Medication management 11/02/2013  . Hyperlipidemia   . Hypertension   . Abnormal glucose   . Vitamin D deficiency   . Diverticulosis     Lanice Shirts PT 01/18/2021, 11:58 AM  [facility] [address] [place], [place], [address] Phone: [phone]   Fax:  [phone]  Name: Javier [name] MRN: [id number] Date of Birth: 06-09-1941

## 2021-01-21 ENCOUNTER — Ambulatory Visit: Payer: Medicare HMO

## 2021-01-21 ENCOUNTER — Ambulatory Visit: Payer: Medicare HMO | Admitting: Physical Therapy

## 2021-01-22 ENCOUNTER — Ambulatory Visit: Payer: Medicare HMO

## 2021-01-22 ENCOUNTER — Other Ambulatory Visit: Payer: Self-pay

## 2021-01-22 DIAGNOSIS — M6281 Muscle weakness (generalized): Secondary | ICD-10-CM

## 2021-01-22 DIAGNOSIS — R2689 Other abnormalities of gait and mobility: Secondary | ICD-10-CM

## 2021-01-22 DIAGNOSIS — R2681 Unsteadiness on feet: Secondary | ICD-10-CM | POA: Diagnosis not present

## 2021-01-22 NOTE — Therapy (Signed)
[facility] [address] [place], [place], [address] Phone: [phone]   Fax:  [phone]  Physical Therapy Treatment  Patient Details  Name: Javier [name] MRN: [id number] Date of Birth: 14-Aug-1941 Referring Provider (PT): Dr. Marcial Pacas   Encounter Date: 01/22/2021   PT End of Session - 01/22/21 1332    Visit Number 6    Number of Visits 17    Date for PT Re-Evaluation 02/28/21    Authorization Type [employer] A[employer]    Progress Note Due on Visit 10    PT Start Time 1100    PT Stop Time 1145    PT Time Calculation (min) 45 min    Equipment Utilized During Treatment Gait belt    Activity Tolerance Patient tolerated treatment well    Behavior During Therapy [facility] for tasks assessed/performed;Flat affect           Past Medical History:  Diagnosis Date  . Diverticulosis   . Hyperlipidemia   . Hypertension   . Memory loss   . Prediabetes   . Vitamin D deficiency     Past Surgical History:  Procedure Laterality Date  . NO PAST SURGERIES      There were no vitals filed for this visit.   Subjective Assessment - 01/22/21 1102    Subjective No falls or pain to note, reported some staggering but no LOB, no change in tremors    Patient is accompained by: Family member    How long can you sit comfortably? unlimited    How long can you stand comfortably? 15'    How long can you walk comfortably? 10'    Patient Stated Goals To return to his prior ambulation status                             [facility] Adult PT Treatment/Exercise - 01/22/21 0001      Transfers   Transfers Sit to Stand    Sit to Stand 4: Min guard    Comments performed 10x prefaced by rocking fwd to facilitate weight shifting      Ambulation/Gait   Ambulation/Gait Yes    Ambulation/Gait Assistance 5: Supervision    Ambulation/Gait Assistance Details posturla cues    Ambulation Distance (Feet) 115 Feet    Assistive device Straight  cane    Gait Pattern Decreased arm swing - left;Step-to pattern;Step-through pattern;Trunk flexed    Ambulation Surface Level;Indoor    Gait Comments O2 sats 97-99%, HR 78-88 post activity      Lumbar Exercises: Supine   Bridge with [employer] 15 reps    Bridge with [employer] Limitations 2x15 with 1.1# ball squeeze    Other Supine Lumbar Exercises alt. supine marching. 2# 2x15    Other Supine Lumbar Exercises hooklie fallouts 2x15 with red band      Knee/Hip Exercises: Seated   Long Prob[employer] officer    Long Arc Quad Weight 2 lbs.    Long Arc Quad Limitations ball squeeze    Heel Slides 2 sets;15 reps    Heel Slides Limitations 2# with towel               Balance Exercises - 01/22/21 0001      Balance Exercises: Standing   Standing Eyes Opened Wide (BOA);Solid surface;1 rep;30 secs;Limitations    Standing Eyes Opened Limitations retro pulses    Standing Eyes Closed Wide (BOA);Solid surface;10 secs;Limitations  Standing Eyes Closed Limitations unable to stand >10s    Sidestepping Upper extremity support;5 reps;Limitations    Sidestepping Limitations performed 5 trips at counter, cuing needed to remain on task and retain proper form with activity               PT Short Term Goals - 01/15/21 1158      PT SHORT TERM GOAL #1   Title Demo Initial HEP back to PT w/o need of cuing    Baseline initial HEP just given; 01/15/21 HEP updated    Time 4    Period Weeks    Status New    Target Date 01/24/21      PT SHORT TERM GOAL #2   Title Assess BERG and establish goals    Baseline NT; 01/15/21 BERG score 41, goal is 49    Time 4    Period Weeks    Status Achieved    Target Date 01/24/21      PT SHORT TERM GOAL #3   Title Assess DGI/FGA and establish goals    Baseline NT; DGI score 17, goal is 21    Time 4    Period Weeks    Status Achieved    Target Date 01/24/21      PT SHORT TERM GOAL #4   Title patient to ambulate 239ft with LRAD and S     Baseline in clinic ambulation <61ft w/o AD    Time 4    Period Weeks    Target Date 01/24/21      PT SHORT TERM GOAL #5   Title Patient to perform 5x STS in <15s    Baseline 5x STS score of 19.66s    Time 4    Period Weeks    Status New    Target Date 01/24/21             PT Long Term Goals - 12/27/20 1040      PT LONG TERM GOAL #1   Title Assess BERG score    Baseline NT    Time 8    Period Weeks    Status New    Target Date 02/28/21      PT LONG TERM GOAL #2   Title assess DGI/FGA score    Baseline NT    Time 8    Period Weeks    Status New    Target Date 02/28/21      PT LONG TERM GOAL #3   Title Patient to ambulate 1044ft wth S and LRAD across outdoor surfaces    Baseline <73ft in clinic w/o AD    Time 8    Period Weeks    Status New    Target Date 02/28/21      PT LONG TERM GOAL #4   Title Patient to negotiate full flight of stairs using reciprocal pattern and minimal need of rails    Baseline able to negotiate 2 stairs at home to access/exit house    Time 8    Period Weeks    Status New    Target Date 02/28/21                 Plan - 01/22/21 1333    Clinical Impression Statement Todays skilled session consisted of BLE strengthening, gait and balance training as well as review of transfer training.  Cuing still needded for proper STS performance due to inability to [place] his COG fwd and continued tendency to lock knees against  sitting surface creating a fall risk.  Balance defcits noted with static standing and worsening with vison removed.  Patients LOB confined to posterior direction with diminished righting reflexes noted    Personal Factors and Comorbidities Comorbidity 2    Comorbidities chronic lymphocytic leukemia, HTN, hyperlipidemia    Examination-Activity Limitations Locomotion Level;Stairs    Examination-Participation Restrictions Medication Management    Stability/Clinical Decision Making Evolving/Moderate complexity    Rehab Potential  Good    PT Frequency 2x / week    PT Duration 8 weeks    PT Treatment/Interventions ADLs/Self Care Home Management;Aquatic Therapy;Gait training;Stair training;Therapeutic activities;Therapeutic exercise;Neuromuscular re-education;Balance training;Patient/family education    PT Next Visit Plan introduce balance training with static and dynamic environments, add vision removed as tolerated, continue to monitor c/o fatigue not correlated with VSs    PT Home Exercise Plan [id number]    Consulted and Agree with Plan of Care Patient;Family member/caregiver    Family Member Consulted spouse           Patient will benefit from skilled therapeutic intervention in order to improve the following deficits and impairments:  Abnormal gait,Decreased coordination,Difficulty walking,Decreased endurance,Decreased activity tolerance,Decreased balance,Decreased mobility  Visit Diagnosis: Unsteadiness on feet  Muscle weakness (generalized)  Other abnormalities of gait and mobility     Problem List Patient Active Problem List   Diagnosis Date Noted  . History of lacunar cerebrovascular accident 01/09/2021  . 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  . BPH with obstruction/lower urinary tract symptoms 01/09/2021  . First degree AV block 01/09/2021  . Gait abnormality 12/20/2020  . Confusion 12/20/2020  . BMI 26.0-26.9,adult 03/06/2020  . Gastric AVM 03/06/2020  . History of colon polyps 03/06/2020  . Aortic atherosclerosis ([place]) 08/23/2019  . Memory changes 05/25/2019  . Counseling regarding advance care planning and goals of care 01/24/2019  . Anxiety 02/08/2018  . CLL (chronic lymphocytic leukemia) ([place]) 11/01/2016  . Encounter for Medicare annual wellness exam 09/11/2015  . GERD  02/09/2015  . Medication management 11/02/2013  . Hyperlipidemia   . Hypertension   . Abnormal glucose   . Vitamin D deficiency   . Diverticulosis     Lanice Shirts PT 01/22/2021, 1:53 PM  [facility] [address] [place], [place], [address] Phone: [phone]   Fax:  [phone]  Name: DARREN CALDRON MRN: [id number] Date of Birth: [birth year]

## 2021-01-25 ENCOUNTER — Ambulatory Visit: Payer: Medicare HMO

## 2021-01-25 ENCOUNTER — Other Ambulatory Visit: Payer: Self-pay

## 2021-01-25 DIAGNOSIS — R2681 Unsteadiness on feet: Secondary | ICD-10-CM

## 2021-01-25 DIAGNOSIS — R2689 Other abnormalities of gait and mobility: Secondary | ICD-10-CM | POA: Diagnosis not present

## 2021-01-25 DIAGNOSIS — M6281 Muscle weakness (generalized): Secondary | ICD-10-CM

## 2021-01-25 NOTE — Therapy (Addendum)
[facility] [address] [place], [place], [address] Phone: [phone]   Fax:  [phone]  Physical Therapy Treatment/DC Summary  Patient Details  Name: Javier [name] MRN: [id number] Date of Birth: 1941-04-30 Referring Provider (PT): Dr. Marcial Pacas   Encounter Date: 01/25/2021  PHYSICAL THERAPY DISCHARGE SUMMARY  Visits from Start of Care: 7  Current functional level related to goals / functional outcomes: UTA   Remaining deficits: UTA   Education / Equipment: HEP   Patient agrees to discharge. Patient goals were partially met. Patient is being discharged due to not returning since the last visit.    PT End of Session - 01/25/21 1341     Visit Number 7    Number of Visits 17    Date for PT Re-Evaluation 02/28/21    Authorization Type [employer]    Progress Note Due on Visit 10    PT Start Time 1100    PT Stop Time 1145    PT Time Calculation (min) 45 min    Equipment Utilized During Treatment Gait belt    Activity Tolerance Patient tolerated treatment well    Behavior During Therapy WFL for tasks assessed/performed;Flat affect             Past Medical History:  Diagnosis Date   Diverticulosis    Hyperlipidemia    Hypertension    Memory loss    Prediabetes    Vitamin D deficiency     Past Surgical History:  Procedure Laterality Date   NO PAST SURGERIES      There were no vitals filed for this visit.   Subjective Assessment - 01/25/21 1340     Subjective No falls to report, spouse reports patient does no follow through with tasks ant home and performs better during PT sessions    Patient is accompained by: Family member    How long can you sit comfortably? unlimited    How long can you stand comfortably? 15'    How long can you walk comfortably? 10'    Patient Stated Goals To return to his prior ambulation status                               [facility] Adult PT  Treatment/Exercise - 01/25/21 0001       Transfers   Transfers Sit to Stand    Sit to Stand 5: Supervision;4: Min guard      Ambulation/Gait   Ambulation/Gait Yes    Ambulation/Gait Assistance 4: Min guard    Ambulation/Gait Assistance Details posture cues    Ambulation Distance (Feet) 115 Feet    Assistive device None    Gait Pattern Decreased arm swing - right;Decreased arm swing - left;Decreased step length - right;Decreased step length - left    Ambulation Surface Level;Indoor      Knee/Hip Exercises: Aerobic   Nustep L2 arms 10 8'                 Balance Exercises - 01/25/21 0001       Balance Exercises: Standing   Standing Eyes Opened Wide (BOA);Solid surface;30 secs    Standing Eyes Opened Limitations retro pulses, unable to maintain standingon Airex pad w/o MinA, 30s hold on solid surface w/o UE support    Standing Eyes Closed Wide (BOA);Head turns;Solid surface;5 reps    Standing Eyes Closed Limitations 30s stance followed by head turns/nods 5x, no  UE support    Retro Gait Foam/compliant surface;Upper extremity support;Limitations    Retro Gait Limitations acrooss blue mat in // bars double and single UE support    Sidestepping Foam/compliant support;5 reps;Limitations    Sidestepping Limitations 5 trips in // bars                 PT Short Term Goals - 01/15/21 1158       PT SHORT TERM GOAL #1   Title Demo Initial HEP back to PT w/o need of cuing    Baseline initial HEP just given; 01/15/21 HEP updated    Time 4    Period Weeks    Status New    Target Date 01/24/21      PT SHORT TERM GOAL #2   Title Assess BERG and establish goals    Baseline NT; 01/15/21 BERG score 41, goal is 49    Time 4    Period Weeks    Status Achieved    Target Date 01/24/21      PT SHORT TERM GOAL #3   Title Assess DGI/FGA and establish goals    Baseline NT; DGI score 17, goal is 21    Time 4    Period Weeks    Status Achieved    Target Date 01/24/21      PT  SHORT TERM GOAL #4   Title patient to ambulate 282f with LRAD and S    Baseline in clinic ambulation <54fw/o AD    Time 4    Period Weeks    Target Date 01/24/21      PT SHORT TERM GOAL #5   Title Patient to perform 5x STS in <15s    Baseline 5x STS score of 19.66s    Time 4    Period Weeks    Status New    Target Date 01/24/21               PT Long Term Goals - 12/27/20 1040       PT LONG TERM GOAL #1   Title Assess BERG score    Baseline NT    Time 8    Period Weeks    Status New    Target Date 02/28/21      PT LONG TERM GOAL #2   Title assess DGI/FGA score    Baseline NT    Time 8    Period Weeks    Status New    Target Date 02/28/21      PT LONG TERM GOAL #3   Title Patient to ambulate 100073fth S and LRAD across outdoor surfaces    Baseline <87f59f clinic w/o AD    Time 8    Period Weeks    Status New    Target Date 02/28/21      PT LONG TERM GOAL #4   Title Patient to negotiate full flight of stairs using reciprocal pattern and minimal need of rails    Baseline able to negotiate 2 stairs at home to access/exit house    Time 8    Period Weeks    Status New    Target Date 02/28/21                   Plan - 01/25/21 1342     Clinical Impression Statement Todays skilled session consisted of introduction of seated stepping to build strength and endurance in non-weight bearing followed by standing balance tasks in vision removed  environment on compliant surfaces, patient unable to stabilize on foam pad w/o need of assist to prevent posterior LOB, activities performed from solid surface only due to poor static balance on compliant surfaces, tandem standing introduced with continued difficulty observed maintaining stability,  Patient requires VCs as well as TCs to follow through and maintain activity commands.  Discussed with spouse ST consult for memory issues as well as difficulty with task follow through but declined at this time    Personal  Factors and Comorbidities Comorbidity 2    Comorbidities chronic lymphocytic leukemia, HTN, hyperlipidemia    Examination-Activity Limitations Locomotion Level;Stairs    Examination-Participation Restrictions Medication Management    Stability/Clinical Decision Making Evolving/Moderate complexity    Rehab Potential Good    PT Frequency 2x / week    PT Duration 8 weeks    PT Treatment/Interventions ADLs/Self Care Home Management;Aquatic Therapy;Gait training;Stair training;Therapeutic activities;Therapeutic exercise;Neuromuscular re-education;Balance training;Patient/family education    PT Next Visit Plan continue balance training with static and dynamic environments, vision removed as tolerated, continue to monitor c/o fatigue not correlated with VSs    PT Home Exercise Plan [email or web address]    Consulted and Agree with Plan of Care Patient;Family member/caregiver    Family Member Consulted spouse             Patient will benefit from skilled therapeutic intervention in order to improve the following deficits and impairments:  Abnormal gait,Decreased coordination,Difficulty walking,Decreased endurance,Decreased activity tolerance,Decreased balance,Decreased mobility  Visit Diagnosis: Unsteadiness on feet  Muscle weakness (generalized)  Other abnormalities of gait and mobility     Problem List Patient Active Problem List   Diagnosis Date Noted   History of lacunar cerebrovascular accident 01/09/2021   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   BPH with obstruction/lower urinary tract symptoms 01/09/2021   First degree AV block 01/09/2021   Gait abnormality 12/20/2020   Confusion 12/20/2020   BMI 26.0-26.9,adult 03/06/2020   Gastric AVM 03/06/2020   History of colon polyps 03/06/2020   Aortic atherosclerosis ([place]) 08/23/2019   Memory changes 05/25/2019   Counseling regarding advance care planning and goals of care 01/24/2019   Anxiety 02/08/2018   CLL (chronic lymphocytic  leukemia) ([place]) 11/01/2016   Encounter for Medicare annual wellness exam 09/11/2015   GERD  02/09/2015   Medication management 11/02/2013   Hyperlipidemia    Hypertension    Abnormal glucose    Vitamin D deficiency    Diverticulosis     Lanice Shirts PT 01/25/2021, 1:52 PM  [facility] [address] [place], [place], [address] Phone: [phone]   Fax:  [phone]  Name: Javier [name] MRN: [id number] Date of Birth: [birth year]

## 2021-01-28 ENCOUNTER — Ambulatory Visit: Payer: Medicare HMO

## 2021-01-29 ENCOUNTER — Ambulatory Visit: Payer: Medicare HMO

## 2021-01-30 ENCOUNTER — Ambulatory Visit: Payer: Medicare HMO

## 2021-02-01 ENCOUNTER — Ambulatory Visit: Payer: Medicare HMO

## 2021-02-04 ENCOUNTER — Ambulatory Visit: Payer: Medicare HMO

## 2021-02-05 ENCOUNTER — Ambulatory Visit: Payer: Medicare HMO

## 2021-02-05 DIAGNOSIS — D125 Benign neoplasm of sigmoid colon: Secondary | ICD-10-CM | POA: Diagnosis not present

## 2021-02-05 DIAGNOSIS — K635 Polyp of colon: Secondary | ICD-10-CM | POA: Diagnosis not present

## 2021-02-05 DIAGNOSIS — D123 Benign neoplasm of transverse colon: Secondary | ICD-10-CM | POA: Diagnosis not present

## 2021-02-05 DIAGNOSIS — Z1211 Encounter for screening for malignant neoplasm of colon: Secondary | ICD-10-CM | POA: Diagnosis not present

## 2021-02-05 DIAGNOSIS — D124 Benign neoplasm of descending colon: Secondary | ICD-10-CM | POA: Diagnosis not present

## 2021-02-05 DIAGNOSIS — K648 Other hemorrhoids: Secondary | ICD-10-CM | POA: Diagnosis not present

## 2021-02-05 DIAGNOSIS — Z86010 Personal history of colonic polyps: Secondary | ICD-10-CM | POA: Diagnosis not present

## 2021-02-05 DIAGNOSIS — K5730 Diverticulosis of large intestine without perforation or abscess without bleeding: Secondary | ICD-10-CM | POA: Diagnosis not present

## 2021-02-05 LAB — HM COLONOSCOPY

## 2021-02-08 ENCOUNTER — Ambulatory Visit: Payer: Medicare HMO

## 2021-02-11 ENCOUNTER — Ambulatory Visit: Payer: Medicare HMO | Admitting: Physical Therapy

## 2021-02-12 ENCOUNTER — Ambulatory Visit: Payer: Medicare HMO | Attending: Neurology

## 2021-02-13 ENCOUNTER — Ambulatory Visit: Payer: Medicare HMO

## 2021-02-14 NOTE — Progress Notes (Signed)
HEMATOLOGY/ONCOLOGY CLINIC NOTE  Date of Service: 02/15/21   Patient Care Team: Unk Pinto, MD as PCP - General (Internal Medicine) Inda Castle, MD (Inactive) as Consulting Physician (Gastroenterology) Melissa Noon, [place] as Referring Physician (Optometry) Danis, Kirke Corin, MD as Consulting Physician (Gastroenterology) Brunetta Genera, MD as Consulting Physician (Hematology)  CHIEF COMPLAINTS/PURPOSE OF CONSULTATION:  F/u for CD5 neg CLL   HISTORY OF PRESENTING ILLNESS:   Javier [name] is a wonderful 80 y.o. male who has been referred to [place] by Dr .Unk Pinto, MD for evaluation and management of lymphocytosis concerning for a lymphoproliferative process.  Patient has a h/o HTN, HLD, Prediabetes who notes that he had significant abdominal discomfort with significant dyspeptic symptoms around aug 2017 and had about 20-25lbs weight loss and had an extensive GI workup including CT abd/pelvis 05/25/2016 - WNL, EGD which showed chronic gastritis (helicobacter pylori +ve) and colonscopy with some polyps. Capsule endoscopy was not approved by insurance. Patients symptoms improved after treatment of H.pylori and he notes that he has gained back about 6-7 lbs and is eating better.  Patients labs were noted to show some leucocytosis/Lymphocytosis for which he was referred to [place] for further evaluation.  Patient notes some nightsweats and weight loss (as noted above, possible alternative explanation). No fevers/chills. Has not noted any overt enlarged LN and CT abd in 04/2016 showed no hepatomegaly or splenomegaly and no abd/RP LNadenopathy.  Patient notes no other acute focal symptoms.   INTERVAL HISTORY: Mr Guevara is here for management and evaluation of his CLL. We are joined today by his wife. The patient's last visit with [place] was on 10/19/2020. The pt reports that he is doing well overall.  The pt reports that he has been feeling a hot feeling on his insides. The  weakness he is experiencing is coming from his Zoloft and Xanax. The pt has been staying active, but sits down most of the day. He does not engage in much physical activity. He makes up the bed and takes out the trash. The pt has been worsening in his fatigue and weakness. He can no longer bowl. He does not feel like doing much anymore. The pt's wife notes he can no longer walk around the grocery store. He has stopped his PT weekly. His memory is very intermittent and comes and goes all the time. The pt has been eating and drinking well still. He notes a much decreased appetite. The pt frequently urinates.  Lab results today 02/15/2021 of CBC w/diff and CMP is as follows: all values are WNL except for Plt of 124K, Sodium of 146, AST of 11. 02/15/2021 LDH of 156.  On review of systems, pt reports weight loss, decreased appetite, fatigue, weakness, frequent urination and denies urinary infection, burning/pain during urination, abdominal pain, leg swelling, and any other symptoms.  MEDICAL HISTORY:  Past Medical History:  Diagnosis Date  . Diverticulosis   . Hyperlipidemia   . Hypertension   . Memory loss   . Prediabetes   . Vitamin D deficiency   Helicobacter pylori +ve   SURGICAL HISTORY: Past Surgical History:  Procedure Laterality Date  . NO PAST SURGERIES      SOCIAL HISTORY: Social History   Socioeconomic History  . Marital status: Married    Spouse name: Not on file  . Number of children: 5  . Years of education: 9th grade  . Highest education level: Not on file  Occupational History  . Occupation: retired  Tobacco Use  . Smoking status: Former Smoker    Types: Cigarettes    Quit date: 01/17/2003    Years since quitting: 18.0  . Smokeless tobacco: Never Used  Vaping Use  . Vaping Use: Never used  Substance and Sexual Activity  . Alcohol use: Not Currently    Comment: very rare  . Drug use: No  . Sexual activity: Not on file  Other Topics Concern  . Not on file   Social History Narrative   Lives at home with his wife.   Right-handed.   At most, one cup caffeine per day.    Social Determinants of Health   Financial Resource Strain: Not on file  Food Insecurity: Not on file  Transportation Needs: Not on file  Physical Activity: Not on file  Stress: Not on file  Social Connections: Not on file  Intimate Partner Violence: Not on file  Quit smoking >10 yrs ago, previously 1.5Packs per week. Started smoking in his teens. Retired. Worked as a Chief Financial Officer.   FAMILY HISTORY: Family History  Problem Relation Age of Onset  . Stroke Mother   . Early death Father        Farm/tractor accident  . Diabetes Brother   . Cirrhosis Brother   . Colon cancer Paternal Grandfather     ALLERGIES:  is allergic to penicillins and ppd [tuberculin purified protein derivative].  MEDICATIONS:  Current Outpatient Medications  Medication Sig Dispense Refill  . ALPRAZolam (XANAX) 0.5 MG tablet Take  1/2 -1 tablet  2-3 x /day  ONLY  if needed for Anxiety Attack &  limit to 5 days /week to avoid Addiction & Dementia. 90 tablet 0  . Alum & Mag Hydroxide-Simeth (MYLANTA PO) Take by mouth as needed.    Marland Kitchen aspirin EC 81 MG tablet Take 81 mg by mouth daily.    . Cholecalciferol (VITAMIN D3) 5000 units TABS Take 5,000 Units by mouth daily.    Marland Kitchen losartan-hydrochlorothiazide (HYZAAR) 100-25 MG tablet Take  1 tablet  Daily  for BP & Fluid Retention /Ankle Swelling 90 tablet 0  . omeprazole (PRILOSEC) 40 MG capsule Take 1 capsule 2 x /day  for Indigestion & Burning 180 capsule 0  . pravastatin (PRAVACHOL) 40 MG tablet TAKE 1 TABLET AT BEDTIME FOR CHOLESTEROL. 90 tablet 3  . sertraline (ZOLOFT) 50 MG tablet Take 2 tablets (100 mg total) by mouth daily. (Patient not taking: Reported on 01/09/2021) 60 tablet 11  . sucralfate (CARAFATE) 1 g tablet Take  1 tablet  4 x/day  before Meals & Bedtime  for Stomach Burning (Patient not taking: Reported on 01/09/2021) 360 tablet 0  .  tamsulosin (FLOMAX) 0.4 MG CAPS capsule Take 1 capsule (0.4 mg total) by mouth daily after supper. For prostate. 90 capsule 1  . triamcinolone (NASACORT) 55 MCG/ACT AERO nasal inhaler Place 2 sprays into the nose at bedtime. (Patient taking differently: Place 2 sprays into the nose as needed.) 1 Inhaler 3   No current facility-administered medications for this visit.    REVIEW OF SYSTEMS:   10 Point review of Systems was done is negative except as noted above.  PHYSICAL EXAMINATION: ECOG PERFORMANCE STATUS: 1 - Symptomatic but completely ambulatory  Vitals:   02/15/21 1229  BP: (!) 145/73  Pulse: 64  Resp: 18  Temp: (!) 97 F (36.1 C)  SpO2: 98%   Filed Weights   02/15/21 1229  Weight: 166 lb 11.2 oz (75.6 kg)   .Body mass index is  23.25 kg/m.    GENERAL:alert, in no acute distress and comfortable SKIN: no acute rashes, no significant lesions EYES: conjunctiva are pink and non-injected, sclera anicteric OROPHARYNX: MMM, no exudates, no oropharyngeal erythema or ulceration NECK: supple, no JVD LYMPH:  no palpable lymphadenopathy in the cervical, axillary or inguinal regions LUNGS: clear to auscultation b/l with normal respiratory effort HEART: regular rate & rhythm ABDOMEN:  normoactive bowel sounds , non tender, not distended. Extremity: no pedal edema PSYCH: alert & oriented x 3 with fluent speech NEURO: no focal motor/sensory deficits    LABORATORY DATA:  I have reviewed the data as listed  . CBC Latest Ref Rng & Units 02/15/2021 01/09/2021 10/19/2020  WBC 4.0 - 10.5 K/uL 4.9 4.6 6.0  Hemoglobin 13.0 - 17.0 g/dL 13.1 12.5(L) 13.8  Hematocrit 39.0 - 52.0 % 40.4 38.9 40.5  Platelets 150 - 400 K/uL 124(L) 158 172   . CBC    Component Value Date/Time   WBC 4.9 02/15/2021 1129   WBC 4.6 01/09/2021 0000   RBC 4.32 02/15/2021 1129   HGB 13.1 02/15/2021 1129   HGB 14.5 10/21/2016 1324   HCT 40.4 02/15/2021 1129   HCT 43.8 10/21/2016 1324   PLT 124 (L)  02/15/2021 1129   PLT 157 10/21/2016 1324   MCV 93.5 02/15/2021 1129   MCV 90.9 10/21/2016 1324   MCH 30.3 02/15/2021 1129   MCHC 32.4 02/15/2021 1129   RDW 13.5 02/15/2021 1129   RDW 13.3 10/21/2016 1324   LYMPHSABS 1.8 02/15/2021 1129   LYMPHSABS 17.6 (H) 10/21/2016 1324   MONOABS 0.5 02/15/2021 1129   MONOABS 0.6 10/21/2016 1324   EOSABS 0.0 02/15/2021 1129   EOSABS 0.0 10/21/2016 1324   BASOSABS 0.0 02/15/2021 1129   BASOSABS 0.1 10/21/2016 1324     . CMP Latest Ref Rng & Units 02/15/2021 01/09/2021 10/19/2020  Glucose 70 - 99 mg/dL 85 89 127(H)  BUN 8 - 23 mg/dL _0 Creatinine 0.61 - 1.24 mg/dL 1.09 0.98 1.19  Sodium 135 - 145 mmol/L 146(H) 143 141  Potassium 3.5 - 5.1 mmol/L 4.4 4.2 3.3(L)  Chloride 98 - 111 mmol/L 110 109 100  CO2 22 - 32 mmol/L _1 Calcium 8.9 - 10.3 mg/dL 9.9 9.9 9.9  Total Protein 6.5 - 8.1 g/dL 6.5 6.1 6.9  Total Bilirubin 0.3 - 1.2 mg/dL 0.5 0.4 0.8  Alkaline Phos 38 - 126 U/L 61 - 60  AST 15 - 41 U/L 11(L) 10 27  ALT 0 - 44 U/L 7 5(L) 21   . Lab Results  Component Value Date   LDH 156 02/15/2021          RADIOGRAPHIC STUDIES: I have personally reviewed the radiological images as listed and agreed with the findings in the report. No results found.  ASSESSMENT & PLAN:   80 y.o. 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 FISH panel showed p53 (17p13) deletion that would be consistent with this diagnosis . It often represents also be more aggressive form of CLL Differential diagnosis includes - CD5 neg CLL vs splenic lymphoma vs Marginal Zone lymphoma vs LPL (lymphoplasmacytic lymphoma) LDH WNL suggests against a high grade process. Currently no anemia or thrombocytopenia noted. Now with drenching night sweats and fatigue, no fevers/chills.  10/31/16 PET/CT scan shows some small hypermetabolic nodes in the thorax including bilateral hilar paratracheal) esophageal. These  would be difficult to biopsy.  Lost to  follow up between 11/06/16 and 11/19/18  12/01/18 PET/CT revealed Stable scattered small hypermetabolic thoracic lymph nodes, once again at [place] 5 activity level. 2. No findings of involvement in the neck, abdomen/pelvis, or skeleton. 3. Prostatomegaly. 4. Aortic Atherosclerosis and Emphysema 5. Sigmoid colon diverticulosis  PLAN: -Discussed pt labwork today, 02/15/2021; LDH normal, mild dehydration, counts stable. WBC normal, no anemia. -Advised pt that Zoloft can be very sedating and cause his weakness. Discuss this with Neurology. -Recommended pt f/u w prescribing doctor for xanax management and getting off of this if desired. -Recommended pt receive the second COVID booster shot as recently approved. Advised pt to wait 4-6 months following first booster shot before getting this. -Discussed referral to Urologist. Advised pt to discuss w PCP if Flomax is not working well. -Recommend pt try different activities to relieve anxiety and mind-- stationary bike, walking, yoga, meditation. -No lab or clinical evidence of significant disease burden at this time.   -Will see back in 4 months with labs.    2) . Patient Active Problem List   Diagnosis Date Noted  . History of lacunar cerebrovascular accident 01/09/2021  . 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  . BPH with obstruction/lower urinary tract symptoms 01/09/2021  . First degree AV block 01/09/2021  . Gait abnormality 12/20/2020  . Confusion 12/20/2020  . BMI 26.0-26.9,adult 03/06/2020  . Gastric AVM 03/06/2020  . History of colon polyps 03/06/2020  . Aortic atherosclerosis ([place]) 08/23/2019  . Memory changes 05/25/2019  . Counseling regarding advance care planning and goals of care 01/24/2019  . Anxiety 02/08/2018  . CLL (chronic lymphocytic leukemia) ([place]) 11/01/2016  . Encounter for Medicare annual wellness exam 09/11/2015  . GERD  02/09/2015  . Medication management 11/02/2013  .  Hyperlipidemia   . Hypertension   . Abnormal glucose   . Vitamin D deficiency   . Diverticulosis    -f/u with PCP for management of other medical co-morbids   FOLLOW UP: RTC with Dr Irene Limbo with labs in 4 months   The total time spent in the appointment was 20 minutes and more than 50% was on counseling and direct patient cares.  All of the patient's questions were answered with apparent satisfaction. The patient knows to call the clinic with any problems, questions or concerns.   Sullivan Lone MD Honor AAHIVMS [facility] Hematology/Oncology Physician [place]  (Office):       [phone] (Work cell):  [phone] (Fax):           [phone]  02/15/2021 1:01 PM  I, Reinaldo Raddle, am acting as scribe for Dr. Sullivan Lone, MD.    .I have reviewed the above documentation for accuracy and completeness, and I agree with the above. Brunetta Genera MD

## 2021-02-15 ENCOUNTER — Inpatient Hospital Stay: Payer: Medicare HMO | Admitting: Hematology

## 2021-02-15 ENCOUNTER — Inpatient Hospital Stay: Payer: Medicare HMO | Attending: Hematology

## 2021-02-15 ENCOUNTER — Other Ambulatory Visit: Payer: Self-pay | Admitting: *Deleted

## 2021-02-15 ENCOUNTER — Other Ambulatory Visit: Payer: Self-pay

## 2021-02-15 VITALS — BP 145/73 | HR 64 | Temp 97.0°F | Resp 18 | Ht 71.0 in | Wt 166.7 lb

## 2021-02-15 DIAGNOSIS — Q9389 Other deletions from the autosomes: Secondary | ICD-10-CM

## 2021-02-15 DIAGNOSIS — C9110 Chronic lymphocytic leukemia of B-cell type not having achieved remission: Secondary | ICD-10-CM | POA: Diagnosis not present

## 2021-02-15 DIAGNOSIS — D472 Monoclonal gammopathy: Secondary | ICD-10-CM | POA: Diagnosis not present

## 2021-02-15 LAB — CBC WITH DIFFERENTIAL (CANCER CENTER ONLY)
Abs Immature Granulocytes: 0 10*3/uL (ref 0.00–0.07)
Basophils Absolute: 0 10*3/uL (ref 0.0–0.1)
Basophils Relative: 1 %
Eosinophils Absolute: 0 10*3/uL (ref 0.0–0.5)
Eosinophils Relative: 0 %
HCT: 40.4 % (ref 39.0–52.0)
Hemoglobin: 13.1 g/dL (ref 13.0–17.0)
Immature Granulocytes: 0 %
Lymphocytes Relative: 37 %
Lymphs Abs: 1.8 10*3/uL (ref 0.7–4.0)
MCH: 30.3 pg (ref 26.0–34.0)
MCHC: 32.4 g/dL (ref 30.0–36.0)
MCV: 93.5 fL (ref 80.0–100.0)
Monocytes Absolute: 0.5 10*3/uL (ref 0.1–1.0)
Monocytes Relative: 10 %
Neutro Abs: 2.5 10*3/uL (ref 1.7–7.7)
Neutrophils Relative %: 52 %
Platelet Count: 124 10*3/uL — ABNORMAL LOW (ref 150–400)
RBC: 4.32 MIL/uL (ref 4.22–5.81)
RDW: 13.5 % (ref 11.5–15.5)
WBC Count: 4.9 10*3/uL (ref 4.0–10.5)
nRBC: 0 % (ref 0.0–0.2)

## 2021-02-15 LAB — CMP (CANCER CENTER ONLY)
ALT: 7 U/L (ref 0–44)
AST: 11 U/L — ABNORMAL LOW (ref 15–41)
Albumin: 3.7 g/dL (ref 3.5–5.0)
Alkaline Phosphatase: 61 U/L (ref 38–126)
Anion gap: 9 (ref 5–15)
BUN: 15 mg/dL (ref 8–23)
CO2: 27 mmol/L (ref 22–32)
Calcium: 9.9 mg/dL (ref 8.9–10.3)
Chloride: 110 mmol/L (ref 98–111)
Creatinine: 1.09 mg/dL (ref 0.61–1.24)
GFR, Estimated: >60 mL/min (ref >60)
Glucose, Bld: 85 mg/dL (ref 70–99)
Potassium: 4.4 mmol/L (ref 3.5–5.1)
Sodium: 146 mmol/L — ABNORMAL HIGH (ref 135–145)
Total Bilirubin: 0.5 mg/dL (ref 0.3–1.2)
Total Protein: 6.5 g/dL (ref 6.5–8.1)

## 2021-02-15 LAB — LACTATE DEHYDROGENASE: LDH: 156 U/L (ref 98–192)

## 2021-02-18 ENCOUNTER — Telehealth: Payer: Self-pay | Admitting: Hematology

## 2021-02-18 NOTE — Telephone Encounter (Signed)
Scheduled follow-up appointment per 5/20 los. Patient's wife is aware. Mailed calendar.

## 2021-02-20 ENCOUNTER — Encounter: Payer: Self-pay | Admitting: *Deleted

## 2021-02-21 ENCOUNTER — Telehealth: Payer: Self-pay

## 2021-02-21 ENCOUNTER — Encounter: Payer: Self-pay | Admitting: Hematology

## 2021-02-21 NOTE — Telephone Encounter (Signed)
Alprazolam refill request 

## 2021-02-22 ENCOUNTER — Emergency Department (HOSPITAL_COMMUNITY): Payer: Medicare HMO

## 2021-02-22 ENCOUNTER — Emergency Department (HOSPITAL_COMMUNITY)
Admission: EM | Admit: 2021-02-22 | Discharge: 2021-02-22 | Disposition: A | Discharge status: Home / Self Care | Payer: Medicare HMO | Attending: Emergency Medicine | Admitting: Emergency Medicine

## 2021-02-22 ENCOUNTER — Encounter (HOSPITAL_COMMUNITY): Payer: Self-pay

## 2021-02-22 ENCOUNTER — Other Ambulatory Visit: Payer: Self-pay

## 2021-02-22 DIAGNOSIS — Z79899 Other long term (current) drug therapy: Secondary | ICD-10-CM | POA: Insufficient documentation

## 2021-02-22 DIAGNOSIS — K922 Gastrointestinal hemorrhage, unspecified: Secondary | ICD-10-CM | POA: Diagnosis not present

## 2021-02-22 DIAGNOSIS — Z87891 Personal history of nicotine dependence: Secondary | ICD-10-CM | POA: Diagnosis not present

## 2021-02-22 DIAGNOSIS — Z7982 Long term (current) use of aspirin: Secondary | ICD-10-CM | POA: Diagnosis not present

## 2021-02-22 DIAGNOSIS — K5790 Diverticulosis of intestine, part unspecified, without perforation or abscess without bleeding: Secondary | ICD-10-CM

## 2021-02-22 DIAGNOSIS — K625 Hemorrhage of anus and rectum: Secondary | ICD-10-CM | POA: Insufficient documentation

## 2021-02-22 DIAGNOSIS — R1084 Generalized abdominal pain: Secondary | ICD-10-CM | POA: Diagnosis not present

## 2021-02-22 DIAGNOSIS — I10 Essential (primary) hypertension: Secondary | ICD-10-CM | POA: Diagnosis not present

## 2021-02-22 DIAGNOSIS — K5730 Diverticulosis of large intestine without perforation or abscess without bleeding: Secondary | ICD-10-CM | POA: Diagnosis not present

## 2021-02-22 DIAGNOSIS — K219 Gastro-esophageal reflux disease without esophagitis: Secondary | ICD-10-CM | POA: Diagnosis not present

## 2021-02-22 LAB — URINALYSIS, ROUTINE W REFLEX MICROSCOPIC
Bilirubin Urine: NEGATIVE
Glucose, UA: NEGATIVE mg/dL
Hgb urine dipstick: NEGATIVE
Ketones, ur: NEGATIVE mg/dL
Leukocytes,Ua: NEGATIVE
Nitrite: NEGATIVE
Protein, ur: NEGATIVE mg/dL
Specific Gravity, Urine: 1.006 (ref 1.005–1.030)
pH: 7 (ref 5.0–8.0)

## 2021-02-22 LAB — COMPREHENSIVE METABOLIC PANEL
ALT: 11 U/L (ref 0–44)
AST: 15 U/L (ref 15–41)
Albumin: 4 g/dL (ref 3.5–5.0)
Alkaline Phosphatase: 58 U/L (ref 38–126)
Anion gap: 6 (ref 5–15)
BUN: 12 mg/dL (ref 8–23)
CO2: 24 mmol/L (ref 22–32)
Calcium: 9.8 mg/dL (ref 8.9–10.3)
Chloride: 111 mmol/L (ref 98–111)
Creatinine, Ser: 0.98 mg/dL (ref 0.61–1.24)
GFR, Estimated: >60 mL/min (ref >60)
Glucose, Bld: 94 mg/dL (ref 70–99)
Potassium: 3.6 mmol/L (ref 3.5–5.1)
Sodium: 141 mmol/L (ref 135–145)
Total Bilirubin: 0.9 mg/dL (ref 0.3–1.2)
Total Protein: 6.7 g/dL (ref 6.5–8.1)

## 2021-02-22 LAB — CBC
HCT: 43.1 % (ref 39.0–52.0)
Hemoglobin: 13.8 g/dL (ref 13.0–17.0)
MCH: 30.2 pg (ref 26.0–34.0)
MCHC: 32 g/dL (ref 30.0–36.0)
MCV: 94.3 fL (ref 80.0–100.0)
Platelets: 151 10*3/uL (ref 150–400)
RBC: 4.57 MIL/uL (ref 4.22–5.81)
RDW: 13.6 % (ref 11.5–15.5)
WBC: 5.1 10*3/uL (ref 4.0–10.5)
nRBC: 0 % (ref 0.0–0.2)

## 2021-02-22 LAB — PROTIME-INR
INR: 1.1 (ref 0.8–1.2)
Prothrombin Time: 13.7 seconds (ref 11.4–15.2)

## 2021-02-22 LAB — POC OCCULT BLOOD, ED: Fecal Occult Bld: POSITIVE — AB

## 2021-02-22 NOTE — Discharge Instructions (Signed)
Your history, exam, work-up today are consistent with a diverticular type bleed without diverticulitis.  You do have diverticulosis on the imaging and no other concerning findings seen.  Your hemoglobin was normal and your other labs are improved from prior.  We do feel you are safe for discharge home given your reassuring exam and vital signs on reassessment.  Please make sure you are staying hydrated and rest.  The bleeding will likely stop in the next day or 2.  If you get any symptoms such as shortness of breath, chest pain, shortness of breath, lightheadedness, or passing out, please return to the nearest emergency department.  Please follow-up with your primary doctor in the next few days for repeat hemoglobin.

## 2021-02-22 NOTE — ED Notes (Signed)
Patient transported to CT 

## 2021-02-22 NOTE — ED Provider Notes (Signed)
[facility] EMERGENCY DEPARTMENT Provider Note   CSN: [id number] Arrival date & time: 02/22/21  0734     History Chief Complaint  Patient presents with  . Rectal Bleeding    Javier [name] is a 80 y.o. male.  The history is provided by the patient and medical records. No language interpreter was used.  Abdominal Pain Pain location:  Generalized Pain quality: aching and sharp   Pain radiates to:  Does not radiate Pain severity:  Moderate Onset quality:  Gradual Duration:  1 day Timing:  Constant Progression:  Unchanged Chronicity:  New Relieved by:  Nothing Worsened by:  Nothing Ineffective treatments:  None tried Associated symptoms: fatigue and hematochezia   Associated symptoms: no chest pain, no chills, no constipation, no cough, no diarrhea, no dysuria, no fever, no nausea, no shortness of breath and no vomiting        Past Medical History:  Diagnosis Date  . Diverticulosis   . Hyperlipidemia   . Hypertension   . Memory loss   . Prediabetes   . Vitamin D deficiency     Patient Active Problem List   Diagnosis Date Noted  . History of lacunar cerebrovascular accident 01/09/2021  . 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  . BPH with obstruction/lower urinary tract symptoms 01/09/2021  . First degree AV block 01/09/2021  . Gait abnormality 12/20/2020  . Confusion 12/20/2020  . BMI 26.0-26.9,adult 03/06/2020  . Gastric AVM 03/06/2020  . History of colon polyps 03/06/2020  . Aortic atherosclerosis ([place]) 08/23/2019  . Memory changes 05/25/2019  . Counseling regarding advance care planning and goals of care 01/24/2019  . Anxiety 02/08/2018  . CLL (chronic lymphocytic leukemia) ([place]) 11/01/2016  . Encounter for Medicare annual wellness exam 09/11/2015  . GERD  02/09/2015  . Medication management 11/02/2013  . Hyperlipidemia   . Hypertension   . Abnormal glucose   . Vitamin D deficiency   . Diverticulosis     Past Surgical  History:  Procedure Laterality Date  . NO PAST SURGERIES         Family History  Problem Relation Age of Onset  . Stroke Mother   . Early death Father        Farm/tractor accident  . Diabetes Brother   . Cirrhosis Brother   . Colon cancer Paternal Grandfather     Social History   Tobacco Use  . Smoking status: Former Smoker    Types: Cigarettes    Quit date: 01/17/2003    Years since quitting: 18.1  . Smokeless tobacco: Never Used  Vaping Use  . Vaping Use: Never used  Substance Use Topics  . Alcohol use: Not Currently    Comment: very rare  . Drug use: No    Home Medications Prior to Admission medications   Medication Sig Start Date End Date Taking? Authorizing Provider  ALPRAZolam (XANAX) 0.5 MG tablet Take  1/2 -1 tablet  2-3 x /day  ONLY  if needed for Anxiety Attack &  limit to 5 days /week to avoid Addiction & Dementia. 01/09/21   Liane Comber, NP  Alum & Mag Hydroxide-Simeth (MYLANTA PO) Take by mouth as needed.    [provider]  aspirin EC 81 MG tablet Take 81 mg by mouth daily.    [provider]  Cholecalciferol (VITAMIN D3) 5000 units TABS Take 5,000 Units by mouth daily.    [provider]  losartan-hydrochlorothiazide (HYZAAR) 100-25 MG tablet Take  1 tablet  Daily  for BP & Fluid Retention /Ankle Swelling 12/03/20   Unk Pinto, MD  omeprazole (PRILOSEC) 40 MG capsule Take 1 capsule 2 x /day  for Indigestion & Burning 11/07/20   Unk Pinto, MD  pravastatin (PRAVACHOL) 40 MG tablet TAKE 1 TABLET AT BEDTIME FOR CHOLESTEROL. 01/09/21   Liane Comber, NP  sertraline (ZOLOFT) 50 MG tablet Take 2 tablets (100 mg total) by mouth daily. Patient not taking: Reported on 01/09/2021 12/20/20 12/20/21  Marcial Pacas, MD  sucralfate (CARAFATE) 1 g tablet Take  1 tablet  4 x/day  before Meals & Bedtime  for Stomach Burning Patient not taking: Reported on 01/09/2021 11/07/20   Unk Pinto, MD  tamsulosin (FLOMAX) 0.4 MG CAPS capsule Take  1 capsule (0.4 mg total) by mouth daily after supper. For prostate. 01/09/21   Liane Comber, NP  triamcinolone (NASACORT) 55 MCG/ACT AERO nasal inhaler Place 2 sprays into the nose at bedtime. Patient taking differently: Place 2 sprays into the nose as needed. 01/21/19 05/11/20  Vladimir Crofts, PA-C    Allergies    Penicillins and Ppd [tuberculin purified protein derivative]  Review of Systems   Review of Systems  Constitutional: Positive for fatigue. Negative for chills and fever.  HENT: Negative for congestion.   Respiratory: Negative for cough, chest tightness, shortness of breath and wheezing.   Cardiovascular: Negative for chest pain.  Gastrointestinal: Positive for abdominal pain, blood in stool and hematochezia. Negative for abdominal distention, constipation, diarrhea, nausea and vomiting.  Genitourinary: Negative for dysuria, flank pain and frequency.  Musculoskeletal: Negative for back pain, neck pain and neck stiffness.  Skin: Negative for rash and wound.  Neurological: Negative for weakness, light-headedness, numbness and headaches.  Psychiatric/Behavioral: Negative for agitation and confusion.  All other systems reviewed and are negative.   Physical Exam Updated Vital Signs BP (!) 184/96 (BP Location: Right Arm)   Pulse 68   Temp 98.3 F (36.8 C)   Resp 16   SpO2 99%   Physical Exam Vitals and nursing note reviewed. Exam conducted with a chaperone present.  Constitutional:      General: He is not in acute distress.    Appearance: He is well-developed. He is not ill-appearing, toxic-appearing or diaphoretic.  HENT:     Head: Normocephalic and atraumatic.     Nose: No congestion.     Mouth/Throat:     Mouth: Mucous membranes are moist.     Pharynx: No oropharyngeal exudate or posterior oropharyngeal erythema.  Eyes:     Conjunctiva/sclera: Conjunctivae normal.  Cardiovascular:     Rate and Rhythm: Normal rate and regular rhythm.     Pulses: Normal pulses.      Heart sounds: No murmur heard.   Pulmonary:     Effort: Pulmonary effort is normal. No respiratory distress.     Breath sounds: Normal breath sounds. No wheezing, rhonchi or rales.  Chest:     Chest wall: No tenderness.  Abdominal:     General: Abdomen is flat.     Palpations: Abdomen is soft.     Tenderness: There is abdominal tenderness. There is no right CVA tenderness, guarding or rebound.  Genitourinary:    Rectum: Guaiac result positive. No tenderness or external hemorrhoid.  Musculoskeletal:        General: No tenderness.     Cervical back: Neck supple.  Skin:    General: Skin is warm and dry.     Findings: No erythema.  Neurological:     General: No  focal deficit present.     Mental Status: He is alert.  Psychiatric:        Mood and Affect: Mood normal.     ED Results / Procedures / Treatments   Labs (all labs ordered are listed, but only abnormal results are displayed) Labs Reviewed  URINALYSIS, ROUTINE W REFLEX MICROSCOPIC - Abnormal; Notable for the following components:      Result Value   Color, Urine STRAW (*)    All other components within normal limits  POC OCCULT BLOOD, ED - Abnormal; Notable for the following components:   Fecal Occult Bld POSITIVE (*)    All other components within normal limits  URINE CULTURE  COMPREHENSIVE METABOLIC PANEL  CBC  PROTIME-INR  TYPE AND SCREEN    EKG None  Radiology CT ABDOMEN PELVIS WO CONTRAST  Result Date: 02/22/2021 CLINICAL DATA:  Abdominal pain and tenderness. Gastrointestinal bleeding. Question diverticulitis. EXAM: CT ABDOMEN AND PELVIS WITHOUT CONTRAST TECHNIQUE: Multidetector CT imaging of the abdomen and pelvis was performed following the standard protocol without IV contrast. COMPARISON:  09/19/2020 FINDINGS: Lower chest: Normal Hepatobiliary: Liver parenchyma appears normal without contrast. No calcified gallstones. Pancreas: Normal Spleen: Normal Adrenals/Urinary Tract: Adrenal glands are normal.  Kidneys are normal. No cyst, mass, stone or hydronephrosis. Stomach/Bowel: Stomach and small intestine are normal. Left colon shows mild diverticulosis without evidence of diverticulitis. No lesion seen to explain gastrointestinal bleeding. Vascular/Lymphatic: Aortic atherosclerosis. No aneurysm. IVC is normal. No retroperitoneal adenopathy. Reproductive: Normal except for enlarged prostate. Other: No free fluid or air. Musculoskeletal: Ordinary mild lumbar degenerative changes. IMPRESSION: No abnormality seen to explain the presenting symptoms. The patient does have mild diverticulosis of the left colon but there is no CT evidence of acute diverticulitis. Mildly enlarged prostate. Aortic Atherosclerosis (ICD10-I70.0). Electronically Signed   By: Nelson Chimes M.D.   On: 02/22/2021 10:02    Procedures Procedures   Medications Ordered in ED Medications - No data to display  ED Course  I have reviewed the triage vital signs and the nursing notes.  Pertinent labs & imaging results that were available during my care of the patient were reviewed by me and considered in my medical decision making (see chart for details).    MDM Rules/Calculators/A&P                          Javier [name] is a 80 y.o. male with a past medical history significant for hypertension, hyperlipidemia, diverticulosis, GERD, CLL, gastric AVM, and prior colon polyps who presents with abdominal pain and rectal bleeding.  Patient reports that he chronically has had fatigue and episodes of waxing and waning chills.  He has being seen by his oncology team most recently last week and had overall reassuring work-up at that time.  Patient says that today he woke up having sharp pain in his abdomen as well as some diffuse abdominal pain.  He describes as a 5 out of 10 in severity.  He reports it has improved now but when he had a bowel movement it had a significant mount of red/light maroon-colored blood.  He denies any nausea,  vomiting, or coffee-ground emesis.  Denies any chest pain or shortness of breath.  Denies palpitations.  Denies any urinary changes.  On exam, lungs clear and chest nontender.  Abdomen was tender diffusely.  Bowel sounds were appreciated.  Chaperone was present and a fecal occult card was collected but there was gross blood on DRE.  No external hemorrhoids appreciated.  No rectal tenderness.  Clinically suspect diverticulitis versus diverticulosis as the cause of his symptoms however with his ongoing fatigue and known CLL, we will get screening labs to make sure he is not any symptomatic anemia.  We will get a CT scan.  Given the ongoing shortage of contrast, will do a noncontrasted scan at this time.  Patient reports pain does not go to his back and chart review shows he does not have history of AAA, low suspicion for aortic etiology of his rectal bleeding and abdominal pain.  Anticipate reassessment after work-up to return disposition.  Work-up returned overall reassuring.  INR normal and platelets are improved.  CT scan shows diverticulosis with no evidence of acute diverticulitis or other acute abnormality.  Patient reassessed and informed that he likely is having a diverticular bleed causing some of the symptoms.  Given his overall well appearance and no further bleeding here, we do feel he is safe for discharge home.  Patient will follow-up with his PCP and get a repeat hemoglobin in several days to make sure he has not downtrending further.  He agreed with plan of care and close follow-up.  He no questions or concerns and was discharged in good condition..     Final Clinical Impression(s) / ED Diagnoses Final diagnoses:  Rectal bleeding  Generalized abdominal pain  Diverticulosis    Rx / DC Orders ED Discharge Orders    None       Clinical Impression: 1. Rectal bleeding   2. Generalized abdominal pain   3. Diverticulosis     Disposition: Discharge  Condition: Good  I have  discussed the results, Dx and Tx plan with the pt(& family if present). He/she/they expressed understanding and agree(s) with the plan. Discharge instructions discussed at great length. Strict return precautions discussed and pt &/or family have verbalized understanding of the instructions. No further questions at time of discharge.    New Prescriptions   No medications on file    Follow Up: Unk Pinto, [place] [id number] [phone]     [place] [address] [id number] mc [place]       Trinitee Horgan, Gwenyth Allegra, MD 02/22/21 1137

## 2021-02-23 ENCOUNTER — Other Ambulatory Visit: Payer: Self-pay | Admitting: Internal Medicine

## 2021-02-23 DIAGNOSIS — F419 Anxiety disorder, unspecified: Secondary | ICD-10-CM

## 2021-02-23 LAB — URINE CULTURE: Culture: NO GROWTH

## 2021-02-23 MED ORDER — SERTRALINE HCL 100 MG PO TABS: ORAL_TABLET | ORAL | 0 refills | Status: DC

## 2021-02-23 MED ORDER — ALPRAZOLAM 0.5 MG PO TABS: ORAL_TABLET | ORAL | 0 refills | Status: DC

## 2021-02-24 ENCOUNTER — Other Ambulatory Visit: Payer: Self-pay | Admitting: Internal Medicine

## 2021-02-25 LAB — TYPE AND SCREEN
ABO/RH(D): A POS
Antibody Screen: NEGATIVE
Unit division: 0
Unit division: 0

## 2021-02-25 LAB — BPAM RBC
Blood Product Expiration Date: 202206222359
Blood Product Expiration Date: 202206222359
Unit Type and Rh: 6200
Unit Type and Rh: 6200

## 2021-02-28 ENCOUNTER — Telehealth: Payer: Self-pay | Admitting: Internal Medicine

## 2021-02-28 NOTE — Telephone Encounter (Signed)
spouse called to request psychiatrist recommendation for patient's really bad anxiety.  Per Dr. Melford Aase, please give Dr Norma Fredrickson and [place] phone numbers to patient's spouse.  Called and advised recommendations to spouse.

## 2021-03-18 ENCOUNTER — Encounter: Payer: Medicare HMO | Admitting: Internal Medicine

## 2021-03-21 ENCOUNTER — Ambulatory Visit (INDEPENDENT_AMBULATORY_CARE_PROVIDER_SITE_OTHER): Payer: Medicare HMO | Admitting: Internal Medicine

## 2021-03-21 ENCOUNTER — Other Ambulatory Visit: Payer: Self-pay

## 2021-03-21 ENCOUNTER — Encounter: Payer: Self-pay | Admitting: Internal Medicine

## 2021-03-21 VITALS — BP 130/64 | Temp 97.3°F | Resp 16 | Ht 71.0 in | Wt 161.2 lb

## 2021-03-21 DIAGNOSIS — I10 Essential (primary) hypertension: Secondary | ICD-10-CM | POA: Diagnosis not present

## 2021-03-21 DIAGNOSIS — F0151 Vascular dementia with behavioral disturbance: Secondary | ICD-10-CM

## 2021-03-21 DIAGNOSIS — F01518 Vascular dementia, unspecified severity, with other behavioral disturbance: Secondary | ICD-10-CM

## 2021-03-21 DIAGNOSIS — Z79899 Other long term (current) drug therapy: Secondary | ICD-10-CM

## 2021-03-21 DIAGNOSIS — R531 Weakness: Secondary | ICD-10-CM

## 2021-03-21 MED ORDER — SERTRALINE HCL 50 MG PO TABS: ORAL_TABLET | ORAL | 1 refills | Status: DC

## 2021-03-21 NOTE — Progress Notes (Signed)
Future Appointments  Date Time Provider [place]  03/28/2021  1:30 PM Marcial Pacas, MD [facility] None  05/16/2021 10:30 AM Liane Comber, NP [facility] None  06/18/2021  1:30 PM [facility] None  06/18/2021  2:00 PM Brunetta Genera, MD [facility] None  01/22/2022  2:00 PM Unk Pinto, MD [facility] None   History of Present Illness:    Wife [name]) relates patient is anxious - c/o anxiety and she desires to have him sent to ER or a Nursing home. She denies any violent acts or behaviors and her primary complaints are that he seems to have poor comprehension when asked to do something.  She relates that his oral intake is poor.   Medications   Current Outpatient Medications (Cardiovascular):    losartan-hydrochlorothiazide (HYZAAR) 100-25 MG tablet, Take  1 tablet  Daily  for BP & Fluid Retention /Ankle Swelling   pravastatin (PRAVACHOL) 40 MG tablet, TAKE 1 TABLET AT BEDTIME FOR CHOLESTEROL.   triamcinolone (NASACORT) 55 MCG/ACT AERO nasal inhaler, Place 2 sprays into the nose at bedtime. (Patient taking differently: Place 2 sprays into the nose as needed.)   aspirin EC 81 MG tablet, Take 81 mg by mouth daily.   ALPRAZolam (XANAX) 0.5 MG tablet, Take  1/2 -1 tablet  2-3 x /day  ONLY  if needed for Anxiety Attack &  limit to 5 days /week to avoid Addiction & Dementia.   Alum & Mag Hydroxide-Simeth (MYLANTA PO), Take by mouth as needed.   Cholecalciferol (VITAMIN D3) 5000 units TABS, Take 5,000 Units by mouth daily.   omeprazole (PRILOSEC) 40 MG capsule, Take 1 capsule 2 x /day  for Indigestion & Burning   sertraline (ZOLOFT) 50 MG tablet, Take 1 tablet Daily for Mood   tamsulosin (FLOMAX) 0.4 MG CAPS capsule, Take 1 capsule (0.4 mg total) by mouth daily after supper. For prostate.  Problem list He has Hyperlipidemia; Hypertension; Abnormal glucose; Vitamin D deficiency; Diverticulosis; Medication management; GERD ; Encounter for Medicare annual wellness  exam; CLL (chronic lymphocytic leukemia) ([place]); Anxiety; Counseling regarding advance care planning and goals of care; Memory changes; Aortic atherosclerosis ([place]); BMI 26.0-26.9,adult; Gastric AVM; History of colon polyps; Gait abnormality; Confusion; History of lacunar cerebrovascular accident; Cerebral microvascular disease; BPH with obstruction/lower urinary tract symptoms; and First degree AV block on their problem list.   Observations/Objective:  BP 130/64   Temp (!) 97.3 F (36.3 C)   Resp 16   Ht 5\' 11"  (1.803 m)   Wt 161 lb 3.2 oz (73.1 kg)   SpO2 95%   BMI 22.48 kg/m   HEENT - WNL Neck - supple.  Chest - Clear equal BS. Cor - Nl HS. RRR w/o sig MGR. PP 1(+). No edema. MS- FROM w/o deformities.  Gait Nl. Neuro -  Nl w/o focal abnormalities.                  Very poor comprehension & insight.                   Limits responses to yes &no.                    Patient is pleasant & in No Distress   Assessment and Plan:   1. Essential hypertension  - CBC with Differential/Platelet - COMPLETE METABOLIC PANEL WITH GFR  2. Weakness  - CBC with Differential/Platelet - COMPLETE METABOLIC PANEL WITH GFR  3. Vascular dementia with behavior disturbance ([place])  4. Medication management  - sertraline50 MG ; Take 1 tablet Daily for Mood  Disp 90 tablet; Refill: 1    Follow Up Instructions:       I discussed the assessment and treatment plan with the patient's wife.  She was provided an opportunity to ask questions and all were answered.  Patient's wife was advised to restart patient's Seroquel  50 mg tid that she had discontinued. The patient's wife agreed with the plan and demonstrated an understanding of the instructions.       They were advised to call back or seek an in-person evaluation if the symptoms worsen or if the condition fails to improve as anticipated.   Kirtland Bouchard, MD

## 2021-03-22 ENCOUNTER — Encounter: Payer: Self-pay | Admitting: Hematology

## 2021-03-22 LAB — COMPLETE METABOLIC PANEL WITH GFR
AG Ratio: 1.9 (calc) (ref 1.0–2.5)
ALT: 9 U/L (ref 9–46)
AST: 10 U/L (ref 10–35)
Albumin: 4.4 g/dL (ref 3.6–5.1)
Alkaline phosphatase (APISO): 60 U/L (ref 35–144)
BUN/Creatinine Ratio: 18 (calc) (ref 6–22)
BUN: 21 mg/dL (ref 7–25)
CO2: 30 mmol/L (ref 20–32)
Calcium: 10.5 mg/dL — ABNORMAL HIGH (ref 8.6–10.3)
Chloride: 105 mmol/L (ref 98–110)
Creat: 1.14 mg/dL — ABNORMAL HIGH (ref 0.70–1.11)
GFR, Est African American: 70 mL/min/{1.73_m2} (ref >=60)
GFR, Est Non African American: 60 mL/min/{1.73_m2} (ref >=60)
Globulin: 2.3 g/dL (calc) (ref 1.9–3.7)
Glucose, Bld: 109 mg/dL — ABNORMAL HIGH (ref 65–99)
Potassium: 3.9 mmol/L (ref 3.5–5.3)
Sodium: 144 mmol/L (ref 135–146)
Total Bilirubin: 0.6 mg/dL (ref 0.2–1.2)
Total Protein: 6.7 g/dL (ref 6.1–8.1)

## 2021-03-22 LAB — CBC WITH DIFFERENTIAL/PLATELET
Absolute Monocytes: 502 cells/uL (ref 200–950)
Basophils Absolute: 40 cells/uL (ref 0–200)
Basophils Relative: 0.6 %
Eosinophils Absolute: 13 cells/uL — ABNORMAL LOW (ref 15–500)
Eosinophils Relative: 0.2 %
HCT: 43.2 % (ref 38.5–50.0)
Hemoglobin: 13.9 g/dL (ref 13.2–17.1)
Lymphs Abs: 1511 cells/uL (ref 850–3900)
MCH: 29.8 pg (ref 27.0–33.0)
MCHC: 32.2 g/dL (ref 32.0–36.0)
MCV: 92.7 fL (ref 80.0–100.0)
MPV: 12 fL (ref 7.5–12.5)
Monocytes Relative: 7.6 %
Neutro Abs: 4534 cells/uL (ref 1500–7800)
Neutrophils Relative %: 68.7 %
Platelets: 165 10*3/uL (ref 140–400)
RBC: 4.66 10*6/uL (ref 4.20–5.80)
RDW: 12.9 % (ref 11.0–15.0)
Total Lymphocyte: 22.9 %
WBC: 6.6 10*3/uL (ref 3.8–10.8)

## 2021-03-23 NOTE — Progress Notes (Signed)
============================================================ ============================================================  -    CBC - Kidneys - Electrolytes &  Liver   - all  Normal / OK  <><><><><><><><><><><><><><><><><><><><><><><><><><><><><><><><><> <><><><><><><><><><><><><><><><><><><><><><><><><><><><><><><><><> 

## 2021-03-25 NOTE — Progress Notes (Signed)
Pt was called to discuss his lab results. Pt wife stated she understood.

## 2021-03-26 ENCOUNTER — Telehealth: Payer: Self-pay | Admitting: Internal Medicine

## 2021-03-26 NOTE — Chronic Care Management (AMB) (Signed)
  Chronic Care Management   Note  03/26/2021 Name: Javier [name] MRN: [id number] DOB: Oct 23, 1940  Javier [name] is a 80 y.o. year old male who is a primary care patient of Javier Pinto, MD. I reached out to Javier [name] by phone today in response to a referral sent by Javier [name], Javier Pinto, MD.   Javier [name] was given information about Chronic Care Management services today including:  CCM service includes personalized support from designated clinical staff supervised by his physician, including individualized plan of care and coordination with other care providers 24/7 contact phone numbers for assistance for urgent and routine care needs. Service will only be billed when office clinical staff spend 20 minutes or more in a month to coordinate care. Only one practitioner may furnish and bill the service in a calendar month. The patient may stop CCM services at any time (effective at the end of the month) by phone call to the office staff.   Javier [name]  verbally agreed to assistance and services provided by embedded care coordination/care management team today.  Follow up plan:   [place] Secretary/administrator

## 2021-03-28 ENCOUNTER — Other Ambulatory Visit: Payer: Self-pay

## 2021-03-28 ENCOUNTER — Ambulatory Visit: Payer: Medicare HMO | Admitting: Neurology

## 2021-03-28 ENCOUNTER — Encounter: Payer: Self-pay | Admitting: Neurology

## 2021-03-28 VITALS — BP 117/66 | HR 70 | Ht 71.0 in | Wt 163.0 lb

## 2021-03-28 DIAGNOSIS — R413 Other amnesia: Secondary | ICD-10-CM

## 2021-03-28 DIAGNOSIS — F0150 Vascular dementia without behavioral disturbance: Secondary | ICD-10-CM | POA: Insufficient documentation

## 2021-03-28 DIAGNOSIS — R451 Restlessness and agitation: Secondary | ICD-10-CM | POA: Diagnosis not present

## 2021-03-28 DIAGNOSIS — G479 Sleep disorder, unspecified: Secondary | ICD-10-CM | POA: Diagnosis not present

## 2021-03-28 DIAGNOSIS — F419 Anxiety disorder, unspecified: Secondary | ICD-10-CM

## 2021-03-28 DIAGNOSIS — F01B18 Vascular dementia, moderate, with other behavioral disturbance: Secondary | ICD-10-CM | POA: Insufficient documentation

## 2021-03-28 MED ORDER — QUETIAPINE FUMARATE 25 MG PO TABS
75.0000 mg | ORAL_TABLET | Freq: Every day | ORAL | 11 refills | Status: DC
Start: 2021-03-28 — End: 2021-05-21

## 2021-03-28 NOTE — Progress Notes (Signed)
Chief Complaint  Patient presents with   Follow-up    Room 16 w/ wife, Javier [name]. Follow up for memory loss, gait abnormality. Recent MMSE 28/30. They would like to review his MRI brain findings. He is taking aspirin 81mg  daily.      ASSESSMENT AND PLAN  Javier [name] is a 80 y.o. male  Mild cognitive impairment Worsening anxiety Difficulty sleeping  Mini-Mental Status Examination was 29/30  Early morning /night time agitation, excessive sweat during night Following his chemotherapy for CLL in early 2021, also concurrent with his worsening anxiety  MRI of the brain in April 2022 showed mild atrophy, mild supratentorium small vessel disease, left cerebellar small lacunar infarction  Laboratory evaluations showed no significant abnormality,  He is no longer taking Zoloft, and Seroquel, but instead taking Xanax 0.5 mg up to 4 tablets a day  I have advised him to have better control of his anxiety, has psychiatric appointment pending, restart Seroquel 25 mg, titrating to 3 tablets every night, only use Xanax as needed   DIAGNOSTIC DATA (LABS, IMAGING, TESTING) - I reviewed patient records, labs, notes, testing and imaging myself where available. MRI of the brain on December 27 2020 1.   No acute findings. 2.   T2/flair hyperintense foci in the cerebral hemispheres consistent with chronic microvascular ischemic change and small foci in the cerebellar hemispheres consistent with small lacunar infarctions.  None of the ischemic changes appears to be acute. CT abdomen on Feb 22, 2021: No abnormality seen to explain the presenting symptoms. The patient does have mild diverticulosis of the left colon but there is no CT evidence of acute diverticulitis.  Laboratory evaluation in 2022,  CMP, creatinine 1.14, calcium mildly elevated 10.5, glucose 109, CBC, Hg 13.9, LDH 156.   HISTORICAL  Javier [name], is a 80 year old male seen in request by her primary care physician Dr. Melford Aase,  Gwyndolyn Saxon for evaluation of memory loss, initial evaluation was on December 20, 2020.  He is accompanied by his wife at today's clinical visit.  I reviewed and summarized the referring note.PMHX. HTN Anxiety, zoloft since 2020, seroquel 50mg  qhs, xanax 0.5mg    He is a retired Furniture conservator/restorer, denied family history of memory loss, he was diagnosed with chronic lymphocytic leukemia, CAT scan 2018 showed small hypermetabolic node in left thorax, received chemotherapy, finished treatment since 2021, was under the care of oncologist Dr. Irene Limbo, most recent visit was on June 22, 2020 he was treated with venetoclax plus obinutuzumab from May to Sept 2020.  Since he finished chemotherapy, around the beginning of 2021, was noted to have early morning excessive sweat, anxiety, felt burning hot, he often has to get up from bed, go outside,  with no shoes on, after few minutes, then he felt cold, has to come in, but could not stay in for too long because of burning hot, sweaty, he has to go outside again, this usually happens 3-4 AM, he will often get in and out of house multiple times, and gradually settle down around 9 AM,  He was given Xanax, which is somewhat helpful, but only takes occasion, if he took excessive amount of Xanax, become very sleepy  He can fall to sleep okay after taking Seroquel 50 mg every night, wife also reported that he become very sedentary since treatment, he cannot get his mind off something if he begin to focus on that  UPDATE March 28 2021: He is accompanied by his wife at today's visit, reported increased anxiety, weakness,  more difficulty sleeping, he gets up in the middle of the sleep every night after few hours of sleep, complains of feeling hot, has to go out to cool down, only when he felt so cold, he came back from outside, then had to go out again with the same complaint,  He has stopped Zoloft, and Seroquel, "it is too much anxiety medications".  Instead he relies on Xanax 0.5 mg  up to 4 tablets each day, prescription was from his primary care physician, wife stated that "he is hooked up to this medications, lives on it".   We personally reviewed MRI wo on December 28 2020,  No acute findings. microvascular ischemic change and small foci in the cerebellar hemispheres consistent with small lacunar infarctions.  None of the ischemic changes appears to be acute.  Laboratory evaluations, CMP showed elevated creatinine 1.14, glucose of 109, calcium mildly elevated 10.5, CBC, hemoglobin of 13.9   REVIEW OF SYSTEMS: Full 14 system review of systems performed and notable only for as above All other review of systems were negative.  PHYSICAL EXAM   Vitals:   03/28/21 1324  BP: 117/66  Pulse: 70  Weight: 163 lb (73.9 kg)  Height: 5\' 11"  (1.803 m)   Not recorded     Body mass index is 22.73 kg/m.  PHYSICAL EXAMNIATION:  Gen: NAD, conversant, well nourised, well groomed       NEUROLOGICAL EXAM:  MENTAL STATUS: MMSE - Mini Mental State Exam 12/20/2020 05/25/2019  Orientation to time 5 5  Orientation to Place 5 5  Registration 2 3  Attention/ Calculation 5 5  Recall 3 2  Language- name 2 objects 2 2  Language- repeat 1 1  Language- follow 3 step command 3 3  Language- read & follow direction 1 1  Write a sentence 1 1  Copy design 0 1  Total score 28 29  animal naming 6.    CRANIAL NERVES: CN II: Visual fields are full to confrontation. Pupils are round equal and briskly reactive to light. CN III, IV, VI: extraocular movement are normal. No ptosis. CN V: Facial sensation is intact to light touch CN VII: Face is symmetric with normal eye closure  CN VIII: Hearing is normal to causal conversation. CN IX, X: Phonation is normal. CN XI: Head turning and shoulder shrug are intact  MOTOR: There is no pronator drift of out-stretched arms. Muscle bulk and tone are normal. Muscle strength is normal.  REFLEXES: Reflexes are 1 and symmetric at the biceps,  triceps, knees, and ankles. Plantar responses are flexor.  SENSORY: Intact to light touch, pinprick and vibratory sensation are intact in fingers and toes.  COORDINATION: There is no trunk or limb dysmetria noted.  GAIT/STANCE: He needs push-up to get up from seated position, tendency to lean backwards, steady but slow  ALLERGIES: Allergies  Allergen Reactions   Penicillins Shortness Of Breath, Swelling and Other (See Comments)    Unknown allergic reaction as a teenager   Ppd [Tuberculin Purified Protein Derivative]     Positive test in 2004    HOME MEDICATIONS: Current Outpatient Medications  Medication Sig Dispense Refill   ALPRAZolam (XANAX) 0.5 MG tablet Take  1/2 -1 tablet  2-3 x /day  ONLY  if needed for Anxiety Attack &  limit to 5 days /week to avoid Addiction & Dementia. 90 tablet 0   Alum & Mag Hydroxide-Simeth (MYLANTA PO) Take by mouth as needed.     aspirin EC 81 MG tablet  Take 81 mg by mouth daily.     Cholecalciferol (VITAMIN D3) 5000 units TABS Take 5,000 Units by mouth daily.     losartan-hydrochlorothiazide (HYZAAR) 100-25 MG tablet Take  1 tablet  Daily  for BP & Fluid Retention /Ankle Swelling 90 tablet 0   pravastatin (PRAVACHOL) 40 MG tablet TAKE 1 TABLET AT BEDTIME FOR CHOLESTEROL. 90 tablet 3   tamsulosin (FLOMAX) 0.4 MG CAPS capsule Take 1 capsule (0.4 mg total) by mouth daily after supper. For prostate. 90 capsule 1   triamcinolone (NASACORT) 55 MCG/ACT AERO nasal inhaler Place 2 sprays into the nose at bedtime. (Patient taking differently: Place 2 sprays into the nose as needed.) 1 Inhaler 3   No current facility-administered medications for this visit.    PAST MEDICAL HISTORY: Past Medical History:  Diagnosis Date   Diverticulosis    Hyperlipidemia    Hypertension    Memory loss    Prediabetes    Vitamin D deficiency     PAST SURGICAL HISTORY: Past Surgical History:  Procedure Laterality Date   NO PAST SURGERIES      FAMILY  HISTORY: Family History  Problem Relation Age of Onset   Stroke Mother    Early death Father        Farm/tractor accident   Diabetes Brother    Cirrhosis Brother    Colon cancer Paternal Grandfather     SOCIAL HISTORY: Social History   Socioeconomic History   Marital status: Married    Spouse name: Not on file   Number of children: 5   Years of education: 9th grade   Highest education level: Not on file  Occupational History   Occupation: retired  Tobacco Use   Smoking status: Former    Pack years: 0.00    Types: Cigarettes    Quit date: 01/17/2003    Years since quitting: 18.2   Smokeless tobacco: Never  Vaping Use   Vaping Use: Never used  Substance and Sexual Activity   Alcohol use: Not Currently    Comment: very rare   Drug use: No   Sexual activity: Not on file  Other Topics Concern   Not on file  Social History Narrative   Lives at home with his wife.   Right-handed.   At most, one cup caffeine per day.    Social Determinants of Health   Financial Resource Strain: Not on file  Food Insecurity: Not on file  Transportation Needs: Not on file  Physical Activity: Not on file  Stress: Not on file  Social Connections: Not on file  Intimate Partner Violence: Not on file      Marcial Pacas, M.D. Ph.D.  [facility] [address] [place], [place] [address] Ph: [phone] Fax: [phone]  CC:  Unk Pinto, MD [address] [place],  [place] [address]  Unk Pinto, MD

## 2021-04-16 DIAGNOSIS — F4323 Adjustment disorder with mixed anxiety and depressed mood: Secondary | ICD-10-CM | POA: Diagnosis not present

## 2021-04-20 ENCOUNTER — Emergency Department (HOSPITAL_BASED_OUTPATIENT_CLINIC_OR_DEPARTMENT_OTHER): Payer: No Typology Code available for payment source

## 2021-04-20 ENCOUNTER — Other Ambulatory Visit: Payer: Self-pay

## 2021-04-20 ENCOUNTER — Emergency Department (HOSPITAL_BASED_OUTPATIENT_CLINIC_OR_DEPARTMENT_OTHER)
Admission: EM | Admit: 2021-04-20 | Discharge: 2021-04-20 | Disposition: A | Discharge status: Home / Self Care | Payer: No Typology Code available for payment source | Attending: Emergency Medicine | Admitting: Emergency Medicine

## 2021-04-20 ENCOUNTER — Encounter (HOSPITAL_BASED_OUTPATIENT_CLINIC_OR_DEPARTMENT_OTHER): Payer: Self-pay | Admitting: Urology

## 2021-04-20 DIAGNOSIS — Z20822 Contact with and (suspected) exposure to covid-19: Secondary | ICD-10-CM | POA: Insufficient documentation

## 2021-04-20 DIAGNOSIS — I429 Cardiomyopathy, unspecified: Secondary | ICD-10-CM | POA: Insufficient documentation

## 2021-04-20 DIAGNOSIS — N400 Enlarged prostate without lower urinary tract symptoms: Secondary | ICD-10-CM | POA: Diagnosis not present

## 2021-04-20 DIAGNOSIS — Z7982 Long term (current) use of aspirin: Secondary | ICD-10-CM | POA: Diagnosis not present

## 2021-04-20 DIAGNOSIS — I10 Essential (primary) hypertension: Secondary | ICD-10-CM | POA: Diagnosis not present

## 2021-04-20 DIAGNOSIS — K219 Gastro-esophageal reflux disease without esophagitis: Secondary | ICD-10-CM | POA: Insufficient documentation

## 2021-04-20 DIAGNOSIS — Z79899 Other long term (current) drug therapy: Secondary | ICD-10-CM | POA: Insufficient documentation

## 2021-04-20 DIAGNOSIS — R531 Weakness: Secondary | ICD-10-CM | POA: Insufficient documentation

## 2021-04-20 DIAGNOSIS — Z87891 Personal history of nicotine dependence: Secondary | ICD-10-CM | POA: Insufficient documentation

## 2021-04-20 DIAGNOSIS — R109 Unspecified abdominal pain: Secondary | ICD-10-CM | POA: Insufficient documentation

## 2021-04-20 DIAGNOSIS — R0981 Nasal congestion: Secondary | ICD-10-CM | POA: Insufficient documentation

## 2021-04-20 DIAGNOSIS — S0990XA Unspecified injury of head, initial encounter: Secondary | ICD-10-CM | POA: Diagnosis not present

## 2021-04-20 LAB — COMPREHENSIVE METABOLIC PANEL
ALT: 7 U/L (ref 0–44)
AST: 12 U/L — ABNORMAL LOW (ref 15–41)
Albumin: 3.9 g/dL (ref 3.5–5.0)
Alkaline Phosphatase: 53 U/L (ref 38–126)
Anion gap: 8 (ref 5–15)
BUN: 11 mg/dL (ref 8–23)
CO2: 25 mmol/L (ref 22–32)
Calcium: 9 mg/dL (ref 8.9–10.3)
Chloride: 107 mmol/L (ref 98–111)
Creatinine, Ser: 1.13 mg/dL (ref 0.61–1.24)
GFR, Estimated: >60 mL/min (ref >60)
Glucose, Bld: 88 mg/dL (ref 70–99)
Potassium: 4.4 mmol/L (ref 3.5–5.1)
Sodium: 140 mmol/L (ref 135–145)
Total Bilirubin: 1.1 mg/dL (ref 0.3–1.2)
Total Protein: 6 g/dL — ABNORMAL LOW (ref 6.5–8.1)

## 2021-04-20 LAB — CBC WITH DIFFERENTIAL/PLATELET
Abs Immature Granulocytes: 0.01 10*3/uL (ref 0.00–0.07)
Basophils Absolute: 0 10*3/uL (ref 0.0–0.1)
Basophils Relative: 0 %
Eosinophils Absolute: 0 10*3/uL (ref 0.0–0.5)
Eosinophils Relative: 0 %
HCT: 40.2 % (ref 39.0–52.0)
Hemoglobin: 13.2 g/dL (ref 13.0–17.0)
Immature Granulocytes: 0 %
Lymphocytes Relative: 21 %
Lymphs Abs: 0.8 10*3/uL (ref 0.7–4.0)
MCH: 30.4 pg (ref 26.0–34.0)
MCHC: 32.8 g/dL (ref 30.0–36.0)
MCV: 92.6 fL (ref 80.0–100.0)
Monocytes Absolute: 0.4 10*3/uL (ref 0.1–1.0)
Monocytes Relative: 10 %
Neutro Abs: 2.6 10*3/uL (ref 1.7–7.7)
Neutrophils Relative %: 69 %
Platelets: 119 10*3/uL — ABNORMAL LOW (ref 150–400)
RBC: 4.34 MIL/uL (ref 4.22–5.81)
RDW: 13.9 % (ref 11.5–15.5)
WBC: 3.8 10*3/uL — ABNORMAL LOW (ref 4.0–10.5)
nRBC: 0 % (ref 0.0–0.2)

## 2021-04-20 LAB — URINALYSIS, ROUTINE W REFLEX MICROSCOPIC
Bilirubin Urine: NEGATIVE
Glucose, UA: NEGATIVE mg/dL
Hgb urine dipstick: NEGATIVE
Ketones, ur: NEGATIVE mg/dL
Leukocytes,Ua: NEGATIVE
Nitrite: NEGATIVE
Protein, ur: NEGATIVE mg/dL
Specific Gravity, Urine: >1.046 — ABNORMAL HIGH (ref 1.005–1.030)
pH: 7 (ref 5.0–8.0)

## 2021-04-20 LAB — BRAIN NATRIURETIC PEPTIDE: B Natriuretic Peptide: 36.6 pg/mL (ref 0.0–100.0)

## 2021-04-20 LAB — RESP PANEL BY RT-PCR (FLU A&B, COVID) ARPGX2
Influenza A by PCR: NEGATIVE
Influenza B by PCR: NEGATIVE
SARS Coronavirus 2 by RT PCR: NEGATIVE

## 2021-04-20 LAB — TROPONIN I (HIGH SENSITIVITY): Troponin I (High Sensitivity): 6 ng/L (ref <18)

## 2021-04-20 MED ORDER — IOHEXOL 350 MG/ML SOLN
100.0000 mL | Freq: Once | INTRAVENOUS | Status: AC | PRN
  Administered 2021-04-20: 80 mL via INTRAVENOUS

## 2021-04-20 MED ORDER — LACTATED RINGERS IV BOLUS
500.0000 mL | Freq: Once | INTRAVENOUS | Status: AC
  Administered 2021-04-20: 500 mL via INTRAVENOUS

## 2021-04-20 NOTE — ED Triage Notes (Addendum)
Per wife pt weak since this morning, not able to stand/walk.  States got 2nd covid booster shot yesterday.  Baseline walking with cane and states generalized weakness x 8 months with worsening weakness just today

## 2021-04-20 NOTE — ED Provider Notes (Signed)
[facility] Provider Note   CSN: [id number] Arrival date & time: 04/20/21  1354     History Chief Complaint  Patient presents with   Weakness    Javier [name] is a 80 y.o. male.  HPI     80 year old male with history of hypertension, hyperlipidemia, chronic lymphocytic leukemia received chemotherapy and finished treatment in June 2021, memory loss, sleeping difficulty, agitation, history of lacunar CVA, gastric AVMs, received COVID booster yesterday, presents with concern for generalized weakness.  For a long time 6-8 months has had generalized weakness, not dizziness but feeling weak and holding onto wall Today got out of bed, but then sat down and every time tried to help him up he would fall back down into the chair, weakness worse Urinates a lot chronically, months Fell in the shower twice last week, wasn't bruised, not sure if he hit his head Has not been complaining of headache, some sinus chronic congestion Denies numbness, weakness, difficulty talking, visual changes or facial droop.   No chest pain, no shortness of breath, no fever, no black or bloody stools or diarrhea Nausea chronically, chronic stomach indigestion , no worsening  This Last TUes saw Dr. Leland Her, took one dose of new medication, couldn't sleep and stopped  Justa wanting to get air, sit on front porch  Past Medical History:  Diagnosis Date   Diverticulosis    Hyperlipidemia    Hypertension    Memory loss    Prediabetes    Vitamin D deficiency     Patient Active Problem List   Diagnosis Date Noted   Memory loss 03/28/2021   Sleeping difficulty 03/28/2021   Agitation 03/28/2021   History of lacunar cerebrovascular accident 01/09/2021   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   BPH with obstruction/lower urinary tract symptoms 01/09/2021   First degree AV block 01/09/2021   Gait abnormality 12/20/2020   Confusion 12/20/2020   BMI 26.0-26.9,adult  03/06/2020   Gastric AVM 03/06/2020   History of colon polyps 03/06/2020   Aortic atherosclerosis ([place]) 08/23/2019   Memory changes 05/25/2019   Counseling regarding advance care planning and goals of care 01/24/2019   Anxiety 02/08/2018   CLL (chronic lymphocytic leukemia) ([place]) 11/01/2016   Encounter for Medicare annual wellness exam 09/11/2015   GERD  02/09/2015   Medication management 11/02/2013   Hyperlipidemia    Hypertension    Abnormal glucose    Vitamin D deficiency    Diverticulosis     Past Surgical History:  Procedure Laterality Date   NO PAST SURGERIES         Family History  Problem Relation Age of Onset   Stroke Mother    Early death Father        Farm/tractor accident   Diabetes Brother    Cirrhosis Brother    Colon cancer Paternal Grandfather     Social History   Tobacco Use   Smoking status: Former    Types: Cigarettes    Quit date: 01/17/2003    Years since quitting: 18.2   Smokeless tobacco: Never  Vaping Use   Vaping Use: Never used  Substance Use Topics   Alcohol use: Not Currently    Comment: very rare   Drug use: No    Home Medications Prior to Admission medications   Medication Sig Start Date End Date Taking? Authorizing Provider  ALPRAZolam (XANAX) 0.5 MG tablet Take  1/2 -1 tablet  2-3 x /day  ONLY  if needed for Anxiety Attack &  limit to 5 days /week to avoid Addiction & Dementia. 02/23/21   Unk Pinto, MD  Alum & Mag Hydroxide-Simeth (MYLANTA PO) Take by mouth as needed.    [provider]  aspirin EC 81 MG tablet Take 81 mg by mouth daily.    [provider]  Cholecalciferol (VITAMIN D3) 5000 units TABS Take 5,000 Units by mouth daily.    [provider]  losartan-hydrochlorothiazide (HYZAAR) 100-25 MG tablet Take  1 tablet  Daily  for BP & Fluid Retention /Ankle Swelling 12/03/20   Unk Pinto, MD  pravastatin (PRAVACHOL) 40 MG tablet TAKE 1 TABLET AT BEDTIME FOR CHOLESTEROL. 01/09/21    Liane Comber, NP  QUEtiapine (SEROQUEL) 25 MG tablet Take 3 tablets (75 mg total) by mouth at bedtime. 03/28/21   Marcial Pacas, MD  tamsulosin (FLOMAX) 0.4 MG CAPS capsule Take 1 capsule (0.4 mg total) by mouth daily after supper. For prostate. 01/09/21   Liane Comber, NP  triamcinolone (NASACORT) 55 MCG/ACT AERO nasal inhaler Place 2 sprays into the nose at bedtime. Patient taking differently: Place 2 sprays into the nose as needed. 01/21/19 05/11/20  Vladimir Crofts, PA-C    Allergies    Penicillins and Ppd [tuberculin purified protein derivative]  Review of Systems   Review of Systems  Constitutional:  Positive for appetite change and fatigue. Negative for fever.  HENT:  Negative for sore throat.   Eyes:  Negative for visual disturbance.  Respiratory:  Negative for cough and shortness of breath.   Cardiovascular:  Negative for chest pain.  Gastrointestinal:  Positive for abdominal pain (on exam). Negative for diarrhea, nausea and vomiting.  Genitourinary:  Negative for difficulty urinating.  Musculoskeletal:  Negative for back pain and neck stiffness.  Skin:  Negative for rash.  Neurological:  Negative for dizziness, syncope, facial asymmetry, speech difficulty, weakness, numbness and headaches.   Physical Exam Updated Vital Signs BP (!) 151/79   Pulse 67   Temp 99.7 F (37.6 C)   Resp 18   Ht 6' (1.829 m)   Wt 80.7 kg   SpO2 98%   BMI 24.14 kg/m   Physical Exam Vitals and nursing note reviewed.  Constitutional:      General: He is not in acute distress.    Appearance: He is well-developed. He is not diaphoretic.  HENT:     Head: Normocephalic and atraumatic.  Eyes:     Conjunctiva/sclera: Conjunctivae normal.  Cardiovascular:     Rate and Rhythm: Normal rate and regular rhythm.     Heart sounds: Normal heart sounds. No murmur heard.   No friction rub. No gallop.  Pulmonary:     Effort: Pulmonary effort is normal. No respiratory distress.     Breath sounds:  Normal breath sounds. No wheezing or rales.  Abdominal:     General: There is no distension.     Palpations: Abdomen is soft.     Tenderness: There is no abdominal tenderness. There is no guarding.  Musculoskeletal:     Cervical back: Normal range of motion.  Skin:    General: Skin is warm and dry.  Neurological:     Mental Status: He is alert and oriented to person, place, and time.    ED Results / Procedures / Treatments   Labs (all labs ordered are listed, but only abnormal results are displayed) Labs Reviewed  COMPREHENSIVE METABOLIC PANEL - Abnormal; Notable for the following components:      Result Value   Total Protein  6.0 (*)    AST 12 (*)    All other components within normal limits  URINALYSIS, ROUTINE W REFLEX MICROSCOPIC - Abnormal; Notable for the following components:   Specific Gravity, Urine >1.046 (*)    All other components within normal limits  CBC WITH DIFFERENTIAL/PLATELET - Abnormal; Notable for the following components:   WBC 3.8 (*)    Platelets 119 (*)    All other components within normal limits  RESP PANEL BY RT-PCR (FLU A&B, COVID) [id number]  URINE CULTURE  BRAIN NATRIURETIC PEPTIDE  CBC WITH DIFFERENTIAL/PLATELET  TROPONIN I (HIGH SENSITIVITY)    EKG EKG Interpretation  Date/Time:  Saturday April 20 2021 15:27:49 EDT Ventricular Rate:  66 PR Interval:  219 QRS Duration: 96 QT Interval:  380 QTC Calculation: 399 R Axis:   25 Text Interpretation: Sinus rhythm Borderline prolonged PR interval No significant change since last tracing Confirmed by Gareth Morgan [phone]) on 04/20/2021 4:46:34 PM  Radiology CT Head Wo Contrast  Result Date: 04/20/2021 CLINICAL DATA:  80 year old male with head trauma. EXAM: CT HEAD WITHOUT CONTRAST TECHNIQUE: Contiguous axial images were obtained from the base of the skull through the vertex without intravenous contrast. COMPARISON:  None FINDINGS: Brain: Moderate age-related atrophy and chronic microvascular  ischemic changes. There is no acute intracranial hemorrhage. No mass effect or midline shift no extra-axial fluid collection. Vascular: No hyperdense vessel or unexpected calcification. Skull: Normal. Negative for fracture or focal lesion. Sinuses/Orbits: No acute finding. Other: None IMPRESSION: 1. No acute intracranial pathology. 2. Moderate age-related atrophy and chronic microvascular ischemic changes. Electronically Signed   By: Anner Crete M.D.   On: 04/20/2021 17:32   CT ABDOMEN PELVIS W CONTRAST  Result Date: 04/20/2021 CLINICAL DATA:  Weakness. EXAM: CT ABDOMEN AND PELVIS WITH CONTRAST TECHNIQUE: Multidetector CT imaging of the abdomen and pelvis was performed using the standard protocol following bolus administration of intravenous contrast. CONTRAST:  42m OMNIPAQUE IOHEXOL 350 MG/ML SOLN COMPARISON:  Feb 22, 2021. FINDINGS: Lower chest: No acute abnormality. Hepatobiliary: No focal liver abnormality is seen. No gallstones, gallbladder wall thickening, or biliary dilatation. Pancreas: Unremarkable. No pancreatic ductal dilatation or surrounding inflammatory changes. Spleen: Normal in size without focal abnormality. Adrenals/Urinary Tract: Adrenal glands appear normal. Left renal cyst is noted. No hydronephrosis or renal obstruction is noted. No renal or ureteral calculi are noted. Urinary bladder is unremarkable. Stomach/Bowel: Stomach is within normal limits. Appendix appears normal. No evidence of bowel wall thickening, distention, or inflammatory changes. Vascular/Lymphatic: Aortic atherosclerosis. No enlarged abdominal or pelvic lymph nodes. Reproductive: Stable mild prostatic enlargement. Other: No abdominal wall hernia or abnormality. No abdominopelvic ascites. Musculoskeletal: No acute or significant osseous findings. IMPRESSION: Stable mild prostatic enlargement. No acute abnormality seen in the abdomen or pelvis. Aortic Atherosclerosis (ICD10-I70.0). Electronically Signed   By: JMarijo ConceptionM.D.   On: 04/20/2021 17:36   DG Chest Portable 1 View  Result Date: 04/20/2021 CLINICAL DATA:  Generalized weakness. EXAM: PORTABLE CHEST 1 VIEW COMPARISON:  07/30/2007 FINDINGS: Cardiac silhouette is normal in size. No mediastinal or hilar masses. Clear lungs.  No pleural effusion or pneumothorax. Skeletal structures are grossly intact. IMPRESSION: No active disease. Electronically Signed   By: DLajean ManesM.D.   On: 04/20/2021 16:14    Procedures Procedures   Medications Ordered in ED Medications  lactated ringers bolus 500 mL (0 mLs Intravenous Stopped 04/20/21 1915)  iohexol (OMNIPAQUE) 350 MG/ML injection 100 mL (80 mLs Intravenous Contrast Given 04/20/21 1703)  ED Course  I have reviewed the triage vital signs and the nursing notes.  Pertinent labs & imaging results that were available during my care of the patient were reviewed by me and considered in my medical decision making (see chart for details).    MDM Rules/Calculators/A&P                            80 year old male with history of hypertension, hyperlipidemia, chronic lymphocytic leukemia received chemotherapy and finished treatment in June 2021, memory loss, sleeping difficulty, agitation, history of lacunar CVA, gastric AVMs, received COVID booster yesterday, presents with concern for generalized weakness progressive over weeks and worse today.   Differential diagnosis includes anemia, electrolyte abnormality, cardiac abnormality, infection, hypothyroidism, other toxic/metabolic abnormalities. Given history of fall with worsening, head CT completed with shows no acute abnormalities. No focal neurologic concerns on history or exam to suggest CVA.  Urinalysis shows no sign of infection. Chest x-ray shows no pneumonia or pulmonary edema. COVID testing negative. CBC showed no sign of anemia, does show mild leukopenia and thrombocytopenia. Electrolytes WNL.  EKG was not changed from prior, patient does not have  chest pain, and troponin negative and have low suspicion for cardiac etiology of symptoms.  BNP normal.  Had abdominal tenderness on exam, CT abdomen pelvis completed to evaluate for diverticulitis or other acute intraabdominal etiology.  Do not see emergent etiology of symptoms.  Recommend continued outpatient evaluation for generalized weakness over weeks, suspect worsening today secondary to inflammatory reaction following booster.  Ambulatory in ED,however given worsening of mobility, feel home health PT/SW to discuss possible rehab is appropriate and order placed. Recommend continued PCP follow up. Patient discharged in stable condition with understanding of reasons to return.     Final Clinical Impression(s) / ED Diagnoses Final diagnoses:  Generalized weakness    Rx / DC Orders ED Discharge Orders     None        Gareth Morgan, MD 04/21/21 [phone]

## 2021-04-20 NOTE — ED Notes (Signed)
Pt has been asked to void twice and has stated he didn't need to. Pt and wife aware we need urine sample.

## 2021-04-20 NOTE — ED Notes (Signed)
piv unsuccessful attempt x2 by Khushbu Pippen barlowRn and once by Dillon Bjork,    rn

## 2021-04-21 ENCOUNTER — Encounter: Payer: Self-pay | Admitting: Hematology

## 2021-04-22 LAB — URINE CULTURE

## 2021-04-23 ENCOUNTER — Telehealth: Payer: Self-pay | Admitting: Internal Medicine

## 2021-04-23 NOTE — Telephone Encounter (Signed)
spouse called after recent ER visit to request Home Health, wheelchair, and/or walker evaluation. Patient having generalized weakness on exertion, unsteady, requires assistance ambulating in home, falling multiple times weekly, frequent ER visits for falls, and weakness. Per Dr. Unk Pinto referral to home health ordered in ER, request evaluate by home health to access all patient needs in home. Order sent to home health, spouse aware and appreciates assistance.

## 2021-04-25 DIAGNOSIS — C9110 Chronic lymphocytic leukemia of B-cell type not having achieved remission: Secondary | ICD-10-CM | POA: Diagnosis not present

## 2021-04-25 DIAGNOSIS — F0390 Unspecified dementia without behavioral disturbance: Secondary | ICD-10-CM | POA: Diagnosis not present

## 2021-04-25 DIAGNOSIS — F419 Anxiety disorder, unspecified: Secondary | ICD-10-CM | POA: Diagnosis not present

## 2021-04-25 DIAGNOSIS — H9193 Unspecified hearing loss, bilateral: Secondary | ICD-10-CM | POA: Diagnosis not present

## 2021-04-25 DIAGNOSIS — I440 Atrioventricular block, first degree: Secondary | ICD-10-CM | POA: Diagnosis not present

## 2021-04-25 DIAGNOSIS — I10 Essential (primary) hypertension: Secondary | ICD-10-CM | POA: Diagnosis not present

## 2021-04-25 DIAGNOSIS — N138 Other obstructive and reflux uropathy: Secondary | ICD-10-CM | POA: Diagnosis not present

## 2021-04-25 DIAGNOSIS — N401 Enlarged prostate with lower urinary tract symptoms: Secondary | ICD-10-CM | POA: Diagnosis not present

## 2021-04-25 DIAGNOSIS — H52223 Regular astigmatism, bilateral: Secondary | ICD-10-CM | POA: Diagnosis not present

## 2021-04-25 DIAGNOSIS — I700 Atherosclerosis of aorta: Secondary | ICD-10-CM | POA: Diagnosis not present

## 2021-04-26 DIAGNOSIS — N138 Other obstructive and reflux uropathy: Secondary | ICD-10-CM | POA: Diagnosis not present

## 2021-04-26 DIAGNOSIS — N401 Enlarged prostate with lower urinary tract symptoms: Secondary | ICD-10-CM | POA: Diagnosis not present

## 2021-04-26 DIAGNOSIS — I440 Atrioventricular block, first degree: Secondary | ICD-10-CM | POA: Diagnosis not present

## 2021-04-26 DIAGNOSIS — H9193 Unspecified hearing loss, bilateral: Secondary | ICD-10-CM | POA: Diagnosis not present

## 2021-04-26 DIAGNOSIS — C9110 Chronic lymphocytic leukemia of B-cell type not having achieved remission: Secondary | ICD-10-CM | POA: Diagnosis not present

## 2021-04-26 DIAGNOSIS — F419 Anxiety disorder, unspecified: Secondary | ICD-10-CM | POA: Diagnosis not present

## 2021-04-26 DIAGNOSIS — I10 Essential (primary) hypertension: Secondary | ICD-10-CM | POA: Diagnosis not present

## 2021-04-26 DIAGNOSIS — F0390 Unspecified dementia without behavioral disturbance: Secondary | ICD-10-CM | POA: Diagnosis not present

## 2021-04-26 DIAGNOSIS — I700 Atherosclerosis of aorta: Secondary | ICD-10-CM | POA: Diagnosis not present

## 2021-04-28 DIAGNOSIS — K219 Gastro-esophageal reflux disease without esophagitis: Secondary | ICD-10-CM | POA: Diagnosis not present

## 2021-04-28 DIAGNOSIS — I10 Essential (primary) hypertension: Secondary | ICD-10-CM | POA: Diagnosis not present

## 2021-04-28 DIAGNOSIS — E785 Hyperlipidemia, unspecified: Secondary | ICD-10-CM | POA: Diagnosis not present

## 2021-04-29 ENCOUNTER — Encounter: Payer: Self-pay | Admitting: Internal Medicine

## 2021-04-29 DIAGNOSIS — N138 Other obstructive and reflux uropathy: Secondary | ICD-10-CM | POA: Diagnosis not present

## 2021-04-29 DIAGNOSIS — F419 Anxiety disorder, unspecified: Secondary | ICD-10-CM | POA: Diagnosis not present

## 2021-04-29 DIAGNOSIS — H9193 Unspecified hearing loss, bilateral: Secondary | ICD-10-CM | POA: Diagnosis not present

## 2021-04-29 DIAGNOSIS — I700 Atherosclerosis of aorta: Secondary | ICD-10-CM | POA: Diagnosis not present

## 2021-04-29 DIAGNOSIS — I440 Atrioventricular block, first degree: Secondary | ICD-10-CM | POA: Diagnosis not present

## 2021-04-29 DIAGNOSIS — Z9181 History of falling: Secondary | ICD-10-CM | POA: Insufficient documentation

## 2021-04-29 DIAGNOSIS — I10 Essential (primary) hypertension: Secondary | ICD-10-CM | POA: Diagnosis not present

## 2021-04-29 DIAGNOSIS — C9110 Chronic lymphocytic leukemia of B-cell type not having achieved remission: Secondary | ICD-10-CM | POA: Diagnosis not present

## 2021-04-29 DIAGNOSIS — F0390 Unspecified dementia without behavioral disturbance: Secondary | ICD-10-CM | POA: Diagnosis not present

## 2021-04-29 DIAGNOSIS — N401 Enlarged prostate with lower urinary tract symptoms: Secondary | ICD-10-CM | POA: Diagnosis not present

## 2021-04-30 ENCOUNTER — Encounter: Payer: Self-pay | Admitting: Internal Medicine

## 2021-04-30 ENCOUNTER — Telehealth: Payer: Self-pay | Admitting: Internal Medicine

## 2021-04-30 NOTE — Telephone Encounter (Signed)
Wife, [name] called to request DME Rolator walker w/ seat to [facility]). Patient continues to have unsteady gait, muscle weakness, and falls in home. States [facility] recommends Rolator walker with seat to assist patient w/ ADLs. Wife states she has concern for patient's worsening confusion, and feels he may need medication management for certain medications. Per Dr. Unk Pinto, DME for [place] sent to [facility]. Advised wife of order sent. [facility] will contact wife for delivery.

## 2021-05-01 DIAGNOSIS — C9110 Chronic lymphocytic leukemia of B-cell type not having achieved remission: Secondary | ICD-10-CM | POA: Diagnosis not present

## 2021-05-01 DIAGNOSIS — H9193 Unspecified hearing loss, bilateral: Secondary | ICD-10-CM | POA: Diagnosis not present

## 2021-05-01 DIAGNOSIS — N401 Enlarged prostate with lower urinary tract symptoms: Secondary | ICD-10-CM | POA: Diagnosis not present

## 2021-05-01 DIAGNOSIS — I440 Atrioventricular block, first degree: Secondary | ICD-10-CM | POA: Diagnosis not present

## 2021-05-01 DIAGNOSIS — I700 Atherosclerosis of aorta: Secondary | ICD-10-CM | POA: Diagnosis not present

## 2021-05-01 DIAGNOSIS — N138 Other obstructive and reflux uropathy: Secondary | ICD-10-CM | POA: Diagnosis not present

## 2021-05-01 DIAGNOSIS — F0390 Unspecified dementia without behavioral disturbance: Secondary | ICD-10-CM | POA: Diagnosis not present

## 2021-05-01 DIAGNOSIS — I10 Essential (primary) hypertension: Secondary | ICD-10-CM | POA: Diagnosis not present

## 2021-05-01 DIAGNOSIS — F419 Anxiety disorder, unspecified: Secondary | ICD-10-CM | POA: Diagnosis not present

## 2021-05-02 DIAGNOSIS — H9193 Unspecified hearing loss, bilateral: Secondary | ICD-10-CM | POA: Diagnosis not present

## 2021-05-02 DIAGNOSIS — I700 Atherosclerosis of aorta: Secondary | ICD-10-CM | POA: Diagnosis not present

## 2021-05-02 DIAGNOSIS — C9110 Chronic lymphocytic leukemia of B-cell type not having achieved remission: Secondary | ICD-10-CM | POA: Diagnosis not present

## 2021-05-02 DIAGNOSIS — I440 Atrioventricular block, first degree: Secondary | ICD-10-CM | POA: Diagnosis not present

## 2021-05-02 DIAGNOSIS — F0390 Unspecified dementia without behavioral disturbance: Secondary | ICD-10-CM | POA: Diagnosis not present

## 2021-05-02 DIAGNOSIS — I10 Essential (primary) hypertension: Secondary | ICD-10-CM | POA: Diagnosis not present

## 2021-05-02 DIAGNOSIS — N138 Other obstructive and reflux uropathy: Secondary | ICD-10-CM | POA: Diagnosis not present

## 2021-05-02 DIAGNOSIS — F419 Anxiety disorder, unspecified: Secondary | ICD-10-CM | POA: Diagnosis not present

## 2021-05-02 DIAGNOSIS — N401 Enlarged prostate with lower urinary tract symptoms: Secondary | ICD-10-CM | POA: Diagnosis not present

## 2021-05-03 ENCOUNTER — Telehealth: Payer: Self-pay

## 2021-05-03 DIAGNOSIS — N401 Enlarged prostate with lower urinary tract symptoms: Secondary | ICD-10-CM | POA: Diagnosis not present

## 2021-05-03 DIAGNOSIS — I10 Essential (primary) hypertension: Secondary | ICD-10-CM | POA: Diagnosis not present

## 2021-05-03 DIAGNOSIS — F0390 Unspecified dementia without behavioral disturbance: Secondary | ICD-10-CM | POA: Diagnosis not present

## 2021-05-03 DIAGNOSIS — H9193 Unspecified hearing loss, bilateral: Secondary | ICD-10-CM | POA: Diagnosis not present

## 2021-05-03 DIAGNOSIS — C9110 Chronic lymphocytic leukemia of B-cell type not having achieved remission: Secondary | ICD-10-CM | POA: Diagnosis not present

## 2021-05-03 DIAGNOSIS — N138 Other obstructive and reflux uropathy: Secondary | ICD-10-CM | POA: Diagnosis not present

## 2021-05-03 DIAGNOSIS — I700 Atherosclerosis of aorta: Secondary | ICD-10-CM | POA: Diagnosis not present

## 2021-05-03 DIAGNOSIS — F419 Anxiety disorder, unspecified: Secondary | ICD-10-CM | POA: Diagnosis not present

## 2021-05-03 DIAGNOSIS — I440 Atrioventricular block, first degree: Secondary | ICD-10-CM | POA: Diagnosis not present

## 2021-05-03 NOTE — Progress Notes (Signed)
     Name: Javier [name]  MRN: [id number] DOB: December 27, 1940    Reason for Encounter: CPP Visit 05/07/2021   Conditions to be addressed/monitored: HTN, HLD, Anxiety, and GERD,  Benign prostatic hyperplasia, Vitamin D deficiency,    Recent office visits:  03/21/2021- Dr. Melford Aase- Patient presented for anxiety. BP 130/64. Discontinued Sertraline '50mg'$  on 03/21/21 by Noberto Retort, RN.  Recent consult visits:  03/28/2021-Dr. Yijun(Neurology)- Patient presented for follow up visit for memory loss and gait abnormality. BP 117/66, Pulse 70. Started Quetiapine '75mg'$  daily at bedtime. Stopped Omeprazole '40mg'$  and Sertraline '50mg'$ .   02/15/2021- Dr. Suzan Slick [place](oncology)- Patient seen for Chronic lymphocytic leukemia. BP 145/73, Pulse 64. No medication changes noted.   Hospital visits:  Medication Reconciliation was completed by comparing discharge summary, patient's EMR and Pharmacy list, and upon discussion with patient.  Admitted to the hospital on 04/20/2021 due to weakness. Discharge date was 04/20/2021. Discharged from [facility].   No medication changes noted -All other medications will remain the same.    Admitted to the hospital on 02/22/2021 due to rectal bleeding. Discharge date was 02/22/2021. Discharged from [facility].   No medication changes noted -All other medications will remain the same.    Medications: Outpatient Encounter Medications as of 05/03/2021  Medication Sig   ALPRAZolam (XANAX) 0.5 MG tablet Take  1/2 -1 tablet  2-3 x /day  ONLY  if needed for Anxiety Attack &  limit to 5 days /week to avoid Addiction & Dementia.   Alum & Mag Hydroxide-Simeth (MYLANTA PO) Take by mouth as needed.   aspirin EC 81 MG tablet Take 81 mg by mouth daily.   Cholecalciferol (VITAMIN D3) 5000 units TABS Take 5,000 Units by mouth daily.   losartan-hydrochlorothiazide (HYZAAR) 100-25 MG tablet Take  1 tablet  Daily  for BP & Fluid Retention /Ankle Swelling    pravastatin (PRAVACHOL) 40 MG tablet TAKE 1 TABLET AT BEDTIME FOR CHOLESTEROL.   QUEtiapine (SEROQUEL) 25 MG tablet Take 3 tablets (75 mg total) by mouth at bedtime.   tamsulosin (FLOMAX) 0.4 MG CAPS capsule Take 1 capsule (0.4 mg total) by mouth daily after supper. For prostate.   triamcinolone (NASACORT) 55 MCG/ACT AERO nasal inhaler Place 2 sprays into the nose at bedtime. (Patient taking differently: Place 2 sprays into the nose as needed.)   No facility-administered encounter medications on file as of 05/03/2021.    Care Gaps: Influenza and Shingles vaccine  Star Rating Drugs: -Pravastatin '40mg'$ , start 12/03/20, 90 day supply, [place] 100-'25mg'$ , start 12/03/2020, 90 day supply,  Product/process development scientist.  Called to confirm CPP visit on 05/03/21. Left detailed voicemail about visit on 05/07/21.   SIG: Hildred Alamin, Health Concierge [phone]

## 2021-05-06 DIAGNOSIS — N138 Other obstructive and reflux uropathy: Secondary | ICD-10-CM | POA: Diagnosis not present

## 2021-05-06 DIAGNOSIS — F419 Anxiety disorder, unspecified: Secondary | ICD-10-CM | POA: Diagnosis not present

## 2021-05-06 DIAGNOSIS — H9193 Unspecified hearing loss, bilateral: Secondary | ICD-10-CM | POA: Diagnosis not present

## 2021-05-06 DIAGNOSIS — I10 Essential (primary) hypertension: Secondary | ICD-10-CM | POA: Diagnosis not present

## 2021-05-06 DIAGNOSIS — C9110 Chronic lymphocytic leukemia of B-cell type not having achieved remission: Secondary | ICD-10-CM | POA: Diagnosis not present

## 2021-05-06 DIAGNOSIS — N401 Enlarged prostate with lower urinary tract symptoms: Secondary | ICD-10-CM | POA: Diagnosis not present

## 2021-05-06 DIAGNOSIS — I700 Atherosclerosis of aorta: Secondary | ICD-10-CM | POA: Diagnosis not present

## 2021-05-06 DIAGNOSIS — I440 Atrioventricular block, first degree: Secondary | ICD-10-CM | POA: Diagnosis not present

## 2021-05-06 DIAGNOSIS — F0390 Unspecified dementia without behavioral disturbance: Secondary | ICD-10-CM | POA: Diagnosis not present

## 2021-05-07 ENCOUNTER — Other Ambulatory Visit: Payer: Self-pay | Admitting: Internal Medicine

## 2021-05-07 ENCOUNTER — Ambulatory Visit (INDEPENDENT_AMBULATORY_CARE_PROVIDER_SITE_OTHER): Payer: Medicare HMO | Admitting: Pharmacist

## 2021-05-07 ENCOUNTER — Other Ambulatory Visit: Payer: Self-pay

## 2021-05-07 DIAGNOSIS — I700 Atherosclerosis of aorta: Secondary | ICD-10-CM

## 2021-05-07 DIAGNOSIS — I10 Essential (primary) hypertension: Secondary | ICD-10-CM | POA: Diagnosis not present

## 2021-05-07 DIAGNOSIS — K219 Gastro-esophageal reflux disease without esophagitis: Secondary | ICD-10-CM

## 2021-05-07 DIAGNOSIS — N138 Other obstructive and reflux uropathy: Secondary | ICD-10-CM

## 2021-05-07 DIAGNOSIS — E782 Mixed hyperlipidemia: Secondary | ICD-10-CM | POA: Diagnosis not present

## 2021-05-07 DIAGNOSIS — R413 Other amnesia: Secondary | ICD-10-CM

## 2021-05-07 DIAGNOSIS — R410 Disorientation, unspecified: Secondary | ICD-10-CM

## 2021-05-07 DIAGNOSIS — R7309 Other abnormal glucose: Secondary | ICD-10-CM

## 2021-05-07 DIAGNOSIS — E559 Vitamin D deficiency, unspecified: Secondary | ICD-10-CM

## 2021-05-07 DIAGNOSIS — Z79899 Other long term (current) drug therapy: Secondary | ICD-10-CM

## 2021-05-07 DIAGNOSIS — F4323 Adjustment disorder with mixed anxiety and depressed mood: Secondary | ICD-10-CM | POA: Diagnosis not present

## 2021-05-07 DIAGNOSIS — N401 Enlarged prostate with lower urinary tract symptoms: Secondary | ICD-10-CM | POA: Diagnosis not present

## 2021-05-07 DIAGNOSIS — F419 Anxiety disorder, unspecified: Secondary | ICD-10-CM

## 2021-05-07 MED ORDER — ROSUVASTATIN CALCIUM 10 MG PO TABS: ORAL_TABLET | ORAL | 3 refills | Status: DC

## 2021-05-07 NOTE — Progress Notes (Signed)
Chronic Care Management Pharmacy Note  05/07/2021 Name:  Javier [name] MRN:  [id number] DOB:  1941/09/25  Summary: Patient's anxiety and memory have improved per patient's wife. He complains of feeling weak. Patient looking for a referral regarding hearing loss.  Recommendations/Changes made from today's visit: -Instructed patient to start vitamin B-12 supplement -Nurses doing at home BP and health check ins for patient     Subjective: Javier [name] is an 80 y.o. year old male who is a primary patient of Unk Pinto, MD.  The CCM team was consulted for assistance with disease management and care coordination needs.    Engaged with patient by telephone for initial visit in response to provider referral for pharmacy case management and/or care coordination services.   Consent to Services:  The patient was given information about Chronic Care Management services, agreed to services, and gave verbal consent prior to initiation of services.  Please see initial visit note for detailed documentation.   Patient Care Team: Unk Pinto, MD as PCP - General (Internal Medicine) Inda Castle, MD (Inactive) as Consulting Physician (Gastroenterology) Melissa Noon, [place] as Referring Physician (Optometry) Danis, Kirke Corin, MD as Consulting Physician (Gastroenterology) Brunetta Genera, MD as Consulting Physician (Hematology) Rush Landmark, [facility] as Pharmacist (Pharmacist)  Recent office visits: 03/21/2021- Dr. Melford Aase- Patient presented for anxiety. BP 130/64. Discontinued Sertraline 53m on 03/21/21 by MNoberto Retort RN.  Recent consult visits: 03/28/2021-Dr. Yijun(Neurology)- Patient presented for follow up visit for memory loss and gait abnormality. BP 117/66, Pulse 70. Started Quetiapine 73mdaily at bedtime. Stopped Omeprazole 4065mnd Sertraline 77m87m 02/15/2021- Dr. GautSuzan Slickhmore(oncology)- Patient seen for Chronic lymphocytic leukemia. BP 145/73, Pulse 64. No  medication changes noted.    Hospital visits: Admitted to the hospital on 04/20/2021 due to weakness. Discharge date was 04/20/2021. [facility] -All other medications will remain the same.     Admitted to the hospital on 02/22/2021 due to rectal bleeding. Discharge date was 02/22/2021. [facility] -All other medications will remain the same.     Objective:  Lab Results  Component Value Date   CREATININE 1.13 04/20/2021   BUN 11 04/20/2021   GFRNONAA >60 04/20/2021   GFRAA 70 03/21/2021   NA 140 04/20/2021   K 4.4 04/20/2021   CALCIUM 9.0 04/20/2021   CO2 25 04/20/2021   GLUCOSE 88 04/20/2021    Lab Results  Component Value Date/Time   HGBA1C 5.6 12/20/2020 03:01 PM   HGBA1C 5.4 06/08/2020 11:14 AM   FRUCTOSAMINE 193 (L) 08/29/2019 03:43 PM   MICROALBUR 0.8 01/09/2021 12:00 AM   MICROALBUR 0.4 12/06/2019 11:06 AM    Last diabetic Eye exam: No results found for: HMDIABEYEEXA  Last diabetic Foot exam: No results found for: HMDIABFOOTEX   Lab Results  Component Value Date   CHOL 202 (H) 01/09/2021   HDL 48 01/09/2021   LDLCALC 135 (H) 01/09/2021   TRIG 91 01/09/2021   CHOLHDL 4.2 01/09/2021    Hepatic Function Latest Ref Rng & Units 04/20/2021 03/21/2021 02/22/2021  Total Protein 6.5 - 8.1 g/dL 6.0(L) 6.7 6.7  Albumin 3.5 - 5.0 g/dL 3.9 - 4.0  AST 15 - 41 U/L 12(L) 10 15  ALT 0 - 44 U/L _0 Alk Phosphatase 38 - 126 U/L 53 - 58  Total Bilirubin 0.3 - 1.2 mg/dL 1.1 0.6 0.9  Bilirubin,  Direct 0.0 - 0.2 mg/dL - - -    Lab Results  Component Value Date/Time   TSH 0.89 01/09/2021 12:00 AM   TSH 0.989 12/20/2020 03:01 PM    CBC Latest Ref Rng & Units 04/20/2021 03/21/2021 02/22/2021  WBC 4.0 - 10.5 K/uL 3.8(L) 6.6 5.1  Hemoglobin 13.0 - 17.0 g/dL 13.2 13.9 13.8  Hematocrit 39.0 - 52.0 % 40.2 43.2 43.1  Platelets 150 - 400 K/uL 119(L) 165 151    Lab  Results  Component Value Date/Time   VD25OH 58 01/09/2021 12:00 AM   VD25OH 83 06/08/2020 11:14 AM    Clinical ASCVD: Yes  The ASCVD Risk score Mikey Bussing DC Jr., et al., 2013) failed to calculate for the following reasons:   The 2013 ASCVD risk score is only valid for ages 39 to 27    Depression screen PHQ 2/9 01/09/2021 06/07/2020 12/05/2019  Decreased Interest 0 0 0  Down, Depressed, Hopeless 0 0 0  PHQ - 2 Score 0 0 0  Some recent data might be hidden     See below as applicable  Other: (XJOIT2PQDI if Afib, MMRC or CAT for COPD, ACT, DEXA)  Social History   Tobacco Use  Smoking Status Former   Types: Cigarettes   Quit date: 01/17/2003   Years since quitting: 18.3  Smokeless Tobacco Never   BP Readings from Last 3 Encounters:  04/20/21 (!) 151/79  03/28/21 117/66  03/21/21 130/64   Pulse Readings from Last 3 Encounters:  04/20/21 67  03/28/21 70  02/22/21 61   Wt Readings from Last 3 Encounters:  04/20/21 178 lb (80.7 kg)  03/28/21 163 lb (73.9 kg)  03/21/21 161 lb 3.2 oz (73.1 kg)   BMI Readings from Last 3 Encounters:  04/20/21 24.14 kg/m  03/28/21 22.73 kg/m  03/21/21 22.48 kg/m    Assessment/Interventions: Review of patient past medical history, allergies, medications, health status, including review of consultants reports, laboratory and other test data, was performed as part of comprehensive evaluation and provision of chronic care management services.   SDOH:  (Social Determinants of Health) assessments and interventions performed: Yes  SDOH Screenings   Alcohol Screen: Not on file  Depression (PHQ2-9): Low Risk    PHQ-2 Score: 0  Financial Resource Strain: Not on file  Food Insecurity: Not on file  Housing: Not on file  Physical Activity: Not on file  Social Connections: Not on file  Stress: Not on file  Tobacco Use: Medium Risk   Smoking Tobacco Use: Former   Smokeless Tobacco Use: Never  Transportation Needs: Not on file    [place]  Allergies  Allergen Reactions   Penicillins Shortness Of Breath, Swelling and Other (See Comments)    Unknown allergic reaction as a teenager   Ppd [Tuberculin Purified Protein Derivative]     Positive test in 2004    Medications Reviewed Today     Reviewed by Rush Landmark, [facility] (Pharmacist) on 05/07/21 at [place] List Status: <None>   Medication Order Taking? Sig Documenting Provider Last Dose Status Informant  ALPRAZolam (XANAX) 0.5 MG tablet [id number] Yes Take  1/2 -1 tablet  2-3 x /day  ONLY  if needed for Anxiety Attack &  limit to 5 days /week to avoid Addiction & Dementia. Unk Pinto, MD Taking Active   Alum & Mag Hydroxide-Simeth (MYLANTA PO) [id number] Yes Take by mouth as needed. [provider] Taking Active   aspirin EC 81 MG tablet [id number] Yes Take 81  mg by mouth daily. [provider] Taking Active Spouse/Significant Other  Cholecalciferol (VITAMIN D3) 5000 units TABS [id number] Yes Take 5,000 Units by mouth daily. [provider] Taking Active Spouse/Significant Other  losartan-hydrochlorothiazide (HYZAAR) 100-25 MG tablet [id number] Yes Take  1 tablet  Daily  for BP & Fluid Retention Hyman Hopes Swelling Unk Pinto, MD Taking Active   pravastatin (PRAVACHOL) 40 MG tablet [id number] Yes TAKE 1 TABLET AT BEDTIME FOR CHOLESTEROL. Liane Comber, NP Taking Active   QUEtiapine (SEROQUEL) 25 MG tablet [id number] Yes Take 3 tablets (75 mg total) by mouth at bedtime. Marcial Pacas, MD Taking Active   tamsulosin [facility]) 0.4 MG CAPS capsule [id number] Yes Take 1 capsule (0.4 mg total) by mouth daily after supper. For prostate. Liane Comber, NP Taking Active   triamcinolone (NASACORT) 55 MCG/ACT AERO nasal inhaler [id number]  Place 2 sprays into the nose at bedtime.  Patient taking differently: Place 2 sprays into the nose as needed.   Vladimir Crofts, [place]  Expired 05/11/20 2359             Patient Active Problem List   Diagnosis Date  Noted   At high risk for falls 04/29/2021   Memory loss 03/28/2021   Sleeping difficulty 03/28/2021   Agitation 03/28/2021   History of lacunar cerebrovascular accident 01/09/2021   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   BPH with obstruction/lower urinary tract symptoms 01/09/2021   First degree AV block 01/09/2021   Gait abnormality 12/20/2020   Confusion 12/20/2020   BMI 26.0-26.9,adult 03/06/2020   Gastric AVM 03/06/2020   History of colon polyps 03/06/2020   Aortic atherosclerosis ([place]) 08/23/2019   Memory changes 05/25/2019   Counseling regarding advance care planning and goals of care 01/24/2019   Anxiety 02/08/2018   CLL (chronic lymphocytic leukemia) ([place]) 11/01/2016   Encounter for Medicare annual wellness exam 09/11/2015   GERD  02/09/2015   Medication management 11/02/2013   Hyperlipidemia    Hypertension    Abnormal glucose    Vitamin D deficiency    Diverticulosis     Immunization History  Administered Date(s) Administered   Influenza Split 07/28/2013   Influenza, High Dose Seasonal PF 09/11/2015, 06/09/2016, 09/15/2018   PFIZER Comirnaty(Gray [place])Covid-19 Tri-Sucrose Vaccine 11/05/2019, 11/26/2019, 08/18/2020, 04/19/2021   Pneumococcal Conjugate-13 11/05/2017   Pneumococcal-Unspecified 07/01/1999, 09/20/2009   Td 09/20/2009   Zoster, Live 11/08/2009    Conditions to be addressed/monitored:  Hypertension, Hyperlipidemia, GERD, Anxiety, and abnormal glucose, vitamin D deficiency, BPH  Care Plan : General Pharmacy (Adult)  Updates made by Rush Landmark, [place] since 05/07/2021 12:00 AM     Problem: Hypertension, hyperlipidemia, GERD, anxiety, abnormal glucose, BPH, vitamin D      Long-Range Goal: Disease Management   Start Date: 05/07/2021  Expected End Date: 11/07/2021  Priority: High  Note:    Current Barriers:  None  Pharmacist Clinical Goal(s):  Patient will achieve adherence to monitoring guidelines and medication adherence to achieve therapeutic  efficacy through collaboration with PharmD and provider.   Interventions: 1:1 collaboration with Unk Pinto, MD regarding development and update of comprehensive plan of care as evidenced by provider attestation and co-signature Inter-disciplinary care team collaboration (see longitudinal plan of care) Comprehensive medication review performed; medication list updated in electronic medical record  Hypertension  (Status:Goal on track: YES.)   Med Management Intervention: None  (BP goal <140/90) -Controlled -Current treatment: Losartan-HCTZ 100-25 mg daily (appropriate, effective, safe, accessible) -Medications previously tried: N/A  -Current home readings: unable to re-call Followed by in  home nursing service and independently checking BP 2-3x/ week and wife states BP is "really good" -Current dietary habits: Not following any restrictions -Current exercise habits: Physical Therapy -Denies hypotensive/hypertensive symptoms Had a recent slip and fall, ED visit, no injuries reported Complaining of weakness -Educated on BP goals and benefits of medications for prevention of heart attack, stroke and kidney damage; Daily salt intake goal < 2300 mg; Importance of home blood pressure monitoring; Proper BP monitoring technique; Symptoms of hypotension and importance of maintaining adequate hydration; -Counseled to monitor BP at home 2-3x/ week, document, and provide log at future appointments -Counseled on diet and exercise extensively Recommended to continue current medication BP assessment September through CCM team to follow up on BP control and weakness  Hyperlipidemia: (LDL goal < 70) -Uncontrolled -Current treatment: Pravastatin 40 mg daily (appropriate, query effective) -Medications previously tried: N/A  -Current dietary patterns: Not following any restrictions -Current exercise habits: Physical therapy -Educated on Cholesterol goals;  Benefits of statin for ASCVD risk  reduction; Importance of limiting foods high in cholesterol; -Counseled on diet and exercise extensively Collaborated with PCP to change statin therapy. Plan pending MD approval.  Anxiety -Controlled -Current treatment: Seroquel 25 mg 2 tablets daily (appropriate, effective, safe, accessible) Alprazolam 0.67m PRN (appropriate, effective, safe, accessible) -Medications previously tried/failed: N/A -PHQ9: N/A -GAD7: 4 (minimal anxiety) -Connected with psychiatrist for mental health support Memory loss, confusion and anxiety have all been improving -Educated on Benefits of medication for symptom control Benefits of cognitive-behavioral therapy with or without medication\ -Counseled on diet and exercise extensively Recommended to continue current medication Educated on risks associated with benzodiazepines  BPH  -Controlled With obstruction/ lower UT sx Followed by urologist -Current treatment  Tamsulosin 0.4 mg daily (appropriate, effective, safe, accessible) -Medications previously tried: N/A  -Recommended to continue current medication  GERD  -Controlled -Current treatment  Maalox prn (appropriate, effective, safe, accessible) -Medications previously tried: N/A  -Recommended to continue current medication Counseled on benefits of smaller meals, decreasing spicy meal, not laying down after a meal  Memory/ confusion  -Controlled Followed by neurology Wife states patient is improving -Current treatment  N/A  Vitamin D Deficiency  -Controlled WNL 58 4/13/202 -Current treatment  Vitamin D 5000 IU daily -Medications previously tried: N/A  -Recommended to continue current medication  Abnormal Blood glucose (Goal: < 5.7) -Controlled HGBA1C 5.6 12/20/2020  -Current treatment  None -Medications previously tried: N/A    Patient Goals/Self-Care Activities Patient will:  - take medications as prescribed  Follow Up Plan: Telephone follow up appointment with care  management team member scheduled for: 08/13/2021       Medication Assistance: None required.  Patient affirms current coverage meets needs.  Compliance/Adherence/Medication fill history: Care Gaps: : Influenza and Shingles vaccine  Star-Rating Drugs: -Pravastatin 470m start 12/03/20, 90 day supply, [id number] 12/03/2020, 90 day supply,   Patient's preferred pharmacy is:  [facility] - [address] [place] [place]: [phone]: [phone] [address] [place]one: [phone]: [phone] pill box? Yes Pt endorses 100% compliance  We discussed: Current pharmacy is preferred with insurance plan and patient is satisfied with pharmacy services Patient decided to: Utilize [facility] pharmacy for medication synchronization, packaging and delivery  Care Plan and Follow Up Patient Decision:  Patient agrees  to Care Plan and Follow-up.  Plan: Telephone follow up appointment with care management team member scheduled for:  08/13/21

## 2021-05-07 NOTE — Patient Instructions (Signed)
Visit Information   PATIENT GOALS:   Goals Addressed             This Visit's Progress    Manage My Medicine       Timeframe:  Long-Range Goal Priority:  High Start Date:                             Expected End Date:                       Follow Up Date 11/07/2020    - keep a list of all the medicines I take; vitamins and herbals too - use a pillbox to sort medicine - use an alarm clock or phone to remind me to take my medicine    Why is this important?   These steps will help you keep on track with your medicines.   Notes:      Track and Manage My Blood Pressure-Hypertension       Timeframe:  Long-Range Goal Priority:  High Start Date:                             Expected End Date:                       Follow Up Date 11/07/2020    - check blood pressure 3 times per week - choose a place to take my blood pressure (home, clinic or office, retail store) - write blood pressure results in a log or diary    Why is this important?   You won't feel high blood pressure, but it can still hurt your blood vessels.  High blood pressure can cause heart or kidney problems. It can also cause a stroke.  Making lifestyle changes like losing a little weight or eating less salt will help.  Checking your blood pressure at home and at different times of the day can help to control blood pressure.  If the doctor prescribes medicine remember to take it the way the doctor ordered.  Call the office if you cannot afford the medicine or if there are questions about it.     Notes:         Consent to CCM Services: Javier [name] was given information about Chronic Care Management services including:  CCM service includes personalized support from designated clinical staff supervised by his physician, including individualized plan of care and coordination with other care providers 24/7 contact phone numbers for assistance for urgent and routine care needs. Service will only be billed when  office clinical staff spend 20 minutes or more in a month to coordinate care. Only one practitioner may furnish and bill the service in a calendar month. The patient may stop CCM services at any time (effective at the end of the month) by phone call to the office staff. The patient will be responsible for cost sharing (co-pay) of up to 20% of the service fee (after annual deductible is met).  Patient agreed to services and verbal consent obtained.   The patient verbalized understanding of instructions, educational materials, and care plan provided today and agreed to receive a mailed copy of patient instructions, educational materials, and care plan.   Telephone follow up appointment with care management team member scheduled for: 08/13/2021  Signature: Fransico Michael, PharmD Clinical Pharmacist [email or web address] [phone]   CLINICAL  CARE PLAN: Patient Care Plan: General Pharmacy (Adult)     Problem Identified: Hypertension, hyperlipidemia, GERD, anxiety, abnormal glucose, BPH, vitamin D      Long-Range Goal: Disease Management   Start Date: 05/07/2021  Expected End Date: 11/07/2021  Priority: High  Note:    Current Barriers:  None  Pharmacist Clinical Goal(s):  Patient will achieve adherence to monitoring guidelines and medication adherence to achieve therapeutic efficacy through collaboration with PharmD and provider.   Interventions: 1:1 collaboration with Unk Pinto, MD regarding development and update of comprehensive plan of care as evidenced by provider attestation and co-signature Inter-disciplinary care team collaboration (see longitudinal plan of care) Comprehensive medication review performed; medication list updated in electronic medical record  Hypertension  (Status:Goal on track: YES.)   Med Management Intervention: None  (BP goal <140/90) -Controlled -Current treatment: Losartan-HCTZ 100-25 mg daily (appropriate, effective, safe,  accessible) -Medications previously tried: N/A  -Current home readings: unable to re-call Followed by in home nursing service and independently checking BP 2-3x/ week and wife states BP is "really good" -Current dietary habits: Not following any restrictions -Current exercise habits: Physical Therapy -Denies hypotensive/hypertensive symptoms Had a recent slip and fall, ED visit, no injuries reported Complaining of weakness -Educated on BP goals and benefits of medications for prevention of heart attack, stroke and kidney damage; Daily salt intake goal < 2300 mg; Importance of home blood pressure monitoring; Proper BP monitoring technique; Symptoms of hypotension and importance of maintaining adequate hydration; -Counseled to monitor BP at home 2-3x/ week, document, and provide log at future appointments -Counseled on diet and exercise extensively Recommended to continue current medication BP assessment September through CCM team to follow up on BP control and weakness  Hyperlipidemia: (LDL goal < 70) -Uncontrolled -Current treatment: Pravastatin 40 mg daily (appropriate, query effective) -Medications previously tried: N/A  -Current dietary patterns: Not following any restrictions -Current exercise habits: Physical therapy -Educated on Cholesterol goals;  Benefits of statin for ASCVD risk reduction; Importance of limiting foods high in cholesterol; -Counseled on diet and exercise extensively Collaborated with PCP to change statin therapy. Plan pending MD approval.  Anxiety -Controlled -Current treatment: Seroquel 25 mg 2 tablets daily (appropriate, effective, safe, accessible) Alprazolam 0.18m PRN (appropriate, effective, safe, accessible) -Medications previously tried/failed: N/A -PHQ9: N/A -GAD7: 4 (minimal anxiety) -Connected with psychiatrist for mental health support Memory loss, confusion and anxiety have all been improving -Educated on Benefits of medication for  symptom control Benefits of cognitive-behavioral therapy with or without medication\ -Counseled on diet and exercise extensively Recommended to continue current medication Educated on risks associated with benzodiazepines  BPH  -Controlled With obstruction/ lower UT sx Followed by urologist -Current treatment  Tamsulosin 0.4 mg daily (appropriate, effective, safe, accessible) -Medications previously tried: N/A  -Recommended to continue current medication  GERD  -Controlled -Current treatment  Maalox prn (appropriate, effective, safe, accessible) -Medications previously tried: N/A  -Recommended to continue current medication Counseled on benefits of smaller meals, decreasing spicy meal, not laying down after a meal  Memory/ confusion  -Controlled Followed by neurology Wife states patient is improving -Current treatment  N/A  Vitamin D Deficiency  -Controlled WNL 58 4/13/202 -Current treatment  Vitamin D 5000 IU daily -Medications previously tried: N/A  -Recommended to continue current medication  Abnormal Blood glucose (Goal: < 5.7) -Controlled HGBA1C 5.6 12/20/2020  -Current treatment  None -Medications previously tried: N/A    Patient Goals/Self-Care Activities Patient will:  - take medications as prescribed  Follow Up Plan: Telephone  follow up appointment with care management team member scheduled for: 08/13/2021

## 2021-05-08 DIAGNOSIS — I440 Atrioventricular block, first degree: Secondary | ICD-10-CM | POA: Diagnosis not present

## 2021-05-08 DIAGNOSIS — N138 Other obstructive and reflux uropathy: Secondary | ICD-10-CM | POA: Diagnosis not present

## 2021-05-08 DIAGNOSIS — I700 Atherosclerosis of aorta: Secondary | ICD-10-CM | POA: Diagnosis not present

## 2021-05-08 DIAGNOSIS — C9110 Chronic lymphocytic leukemia of B-cell type not having achieved remission: Secondary | ICD-10-CM | POA: Diagnosis not present

## 2021-05-08 DIAGNOSIS — F419 Anxiety disorder, unspecified: Secondary | ICD-10-CM | POA: Diagnosis not present

## 2021-05-08 DIAGNOSIS — F0390 Unspecified dementia without behavioral disturbance: Secondary | ICD-10-CM | POA: Diagnosis not present

## 2021-05-08 DIAGNOSIS — H9193 Unspecified hearing loss, bilateral: Secondary | ICD-10-CM | POA: Diagnosis not present

## 2021-05-08 DIAGNOSIS — I10 Essential (primary) hypertension: Secondary | ICD-10-CM | POA: Diagnosis not present

## 2021-05-08 DIAGNOSIS — N401 Enlarged prostate with lower urinary tract symptoms: Secondary | ICD-10-CM | POA: Diagnosis not present

## 2021-05-09 DIAGNOSIS — N138 Other obstructive and reflux uropathy: Secondary | ICD-10-CM | POA: Diagnosis not present

## 2021-05-09 DIAGNOSIS — I10 Essential (primary) hypertension: Secondary | ICD-10-CM | POA: Diagnosis not present

## 2021-05-09 DIAGNOSIS — I700 Atherosclerosis of aorta: Secondary | ICD-10-CM | POA: Diagnosis not present

## 2021-05-09 DIAGNOSIS — I440 Atrioventricular block, first degree: Secondary | ICD-10-CM | POA: Diagnosis not present

## 2021-05-09 DIAGNOSIS — N401 Enlarged prostate with lower urinary tract symptoms: Secondary | ICD-10-CM | POA: Diagnosis not present

## 2021-05-09 DIAGNOSIS — H9193 Unspecified hearing loss, bilateral: Secondary | ICD-10-CM | POA: Diagnosis not present

## 2021-05-09 DIAGNOSIS — C9110 Chronic lymphocytic leukemia of B-cell type not having achieved remission: Secondary | ICD-10-CM | POA: Diagnosis not present

## 2021-05-09 DIAGNOSIS — F419 Anxiety disorder, unspecified: Secondary | ICD-10-CM | POA: Diagnosis not present

## 2021-05-09 DIAGNOSIS — F0390 Unspecified dementia without behavioral disturbance: Secondary | ICD-10-CM | POA: Diagnosis not present

## 2021-05-10 DIAGNOSIS — F419 Anxiety disorder, unspecified: Secondary | ICD-10-CM | POA: Diagnosis not present

## 2021-05-10 DIAGNOSIS — N401 Enlarged prostate with lower urinary tract symptoms: Secondary | ICD-10-CM | POA: Diagnosis not present

## 2021-05-10 DIAGNOSIS — C9110 Chronic lymphocytic leukemia of B-cell type not having achieved remission: Secondary | ICD-10-CM | POA: Diagnosis not present

## 2021-05-10 DIAGNOSIS — N138 Other obstructive and reflux uropathy: Secondary | ICD-10-CM | POA: Diagnosis not present

## 2021-05-10 DIAGNOSIS — I440 Atrioventricular block, first degree: Secondary | ICD-10-CM | POA: Diagnosis not present

## 2021-05-10 DIAGNOSIS — H9193 Unspecified hearing loss, bilateral: Secondary | ICD-10-CM | POA: Diagnosis not present

## 2021-05-10 DIAGNOSIS — I700 Atherosclerosis of aorta: Secondary | ICD-10-CM | POA: Diagnosis not present

## 2021-05-10 DIAGNOSIS — F0390 Unspecified dementia without behavioral disturbance: Secondary | ICD-10-CM | POA: Diagnosis not present

## 2021-05-10 DIAGNOSIS — I10 Essential (primary) hypertension: Secondary | ICD-10-CM | POA: Diagnosis not present

## 2021-05-15 ENCOUNTER — Other Ambulatory Visit: Payer: Self-pay | Admitting: Internal Medicine

## 2021-05-15 DIAGNOSIS — N401 Enlarged prostate with lower urinary tract symptoms: Secondary | ICD-10-CM | POA: Diagnosis not present

## 2021-05-15 DIAGNOSIS — F419 Anxiety disorder, unspecified: Secondary | ICD-10-CM

## 2021-05-15 DIAGNOSIS — I10 Essential (primary) hypertension: Secondary | ICD-10-CM | POA: Diagnosis not present

## 2021-05-15 DIAGNOSIS — H9193 Unspecified hearing loss, bilateral: Secondary | ICD-10-CM | POA: Diagnosis not present

## 2021-05-15 DIAGNOSIS — C9110 Chronic lymphocytic leukemia of B-cell type not having achieved remission: Secondary | ICD-10-CM | POA: Diagnosis not present

## 2021-05-15 DIAGNOSIS — I700 Atherosclerosis of aorta: Secondary | ICD-10-CM | POA: Diagnosis not present

## 2021-05-15 DIAGNOSIS — I440 Atrioventricular block, first degree: Secondary | ICD-10-CM | POA: Diagnosis not present

## 2021-05-15 DIAGNOSIS — N138 Other obstructive and reflux uropathy: Secondary | ICD-10-CM | POA: Diagnosis not present

## 2021-05-15 DIAGNOSIS — F0390 Unspecified dementia without behavioral disturbance: Secondary | ICD-10-CM | POA: Diagnosis not present

## 2021-05-15 NOTE — Progress Notes (Deleted)
4 MONTH FOLLOW UP  Assessment:   Atherosclerosis of aorta (HCC) - per CT 04/2016 Control blood pressure, cholesterol, glucose, increase exercise.   Essential hypertension At goal; continue meds Monitor blood pressure at home; call if consistently over 130/80 Continue DASH diet.   Reminder to go to the ER if any CP, SOB, nausea, dizziness, severe HA, changes vision/speech, left arm numbness and tingling and jaw pain.  Gastroesophageal reflux disease with esophagitis Not taking meds at this time,*** Discussed diet, avoiding triggers and other lifestyle changes  Vitamin D deficiency At goal at recent check; continue to recommend supplementation for goal of 60-100 Check vitamin D level  Other abnormal glucose  Recent A1Cs at goal Discussed diet/exercise, weight management  Defer A1C; check CMP   Medication management CBC, CMP/GFR, magnesium   Mixed hyperlipidemia Was off of pravastatin per Dr. Irene Limbo due to CLL tx Have restarted as completed and also + CVA per MRI Continue low cholesterol diet and exercise.  Check lipid panel.   CLL (chronic lymphocytic leukemia) ([place]) managed by Dr. Susanne Greenhouse x 1 year completed Monitor   Gastric AVM  Follows up GI  Emphasized adherence with recommended medications - restart PPI; monitor CBC and iron ***  History of colon polyps Overdue colonoscopy, referral placed back to GI  Anxiety Improved with recent zoloft dose increase; continue 100 mg/day, PRN xanax; seroquel d/c'd due to lack of benefit and excess sedation Stress management techniques discussed, increase water, good sleep hygiene discussed, increase exercise, and increase veggies.   Memory changes Labs were normal; neuro following Evidence of microvascular changes and lacunar CVA on MRI Follow up as recommended; now on bASA; back on statin   No orders of the defined types were placed in this encounter.  Over 30 minutes of exam, counseling, chart review, and critical  decision making was performed  Future Appointments  Date Time Provider [place]  05/16/2021 10:30 AM Liane Comber, NP [facility] None  06/18/2021  1:30 PM [facility] None  06/18/2021  2:00 PM Brunetta Genera, MD [facility] None  08/13/2021 11:00 AM Rush Landmark, [place] [facility] None  10/29/2021  1:30 PM Marcial Pacas, MD [facility] None  01/22/2022  2:00 PM Unk Pinto, MD [facility] None    Subjective:  Javier [name] is a 80 y.o. male who presents for 4 month follow up.   [name] is with him today.   He is followed by Dr Irene Limbo for smouldering CLL, monitoring only after completed 1 year of venetoclax.   He underwent EGD 01/26/2020 by Dr. Earlean Shawl found bleeding gastric AVM, prescribed omeprazole 40 mg BID but reports taking PRN only, denies notable GERD. ***  He has reported progressive memory changes since 04/2019, had normal labs and was referred to neuro, has seen Dr. Krista Blue, MRI of the brain in April 2022 showed mild atrophy, mild supratentorium small vessel disease, left cerebellar small lacunar infarction. He was recommended 81 mg EC ASA and we restarted statin. Also felt anxiety significantly contributing, he was advised to restart seroquel, reduce xanax use. He is pending psych evaluation ***  he has a diagnosis of anxiety and is currently on zoloft 100 mg (recently increased by neuro), does note improvement in night time anxiety and sweats with dose increase,  xanax 0.25 mg PRN (typically 1-2 times per day), also recently on seroquel 50 mg TID but caused excess sedation and stopped, never perceived benefit. ***  He also recently initiated PT for unsteady gait.   BMI is  There is no height or weight on file to calculate BMI., he has been working on diet, takes short walks ~5 min 4 times daily, also working in yard [employer] from Last 3 Encounters:  04/20/21 178 lb (80.7 kg)  03/28/21 163 lb (73.9 kg)  03/21/21 161 lb 3.2 oz (73.1 kg)   His blood  pressure has been controlled at home, today their BP is    He does workout. He denies chest pain, shortness of breath, dizziness.   He has aortic atherosclerosis per CT 04/2016.   He is on cholesterol medication (had been off of pravastatin while taking venclexa per Dr. Irene Limbo, restarted *** last visit) and denies myalgias. His cholesterol is not at goal. The cholesterol last visit was:   Lab Results  Component Value Date   CHOL 202 (H) 01/09/2021   HDL 48 01/09/2021   LDLCALC 135 (H) 01/09/2021   TRIG 91 01/09/2021   CHOLHDL 4.2 01/09/2021   He has been working on diet and exercise for glucose management, and denies foot ulcerations, increased appetite, nausea, paresthesia of the feet, polydipsia, polyuria, visual disturbances, vomiting and weight loss. Last A1C in the office was:  Lab Results  Component Value Date   HGBA1C 5.6 12/20/2020   Last GFR Lab Results  Component Value Date   GFRAA 70 03/21/2021   Patient is on Vitamin D supplement.   Lab Results  Component Value Date   VD25OH 13 01/09/2021     He reports slow onset of urine stream, does report 3-4 times of nocturia per night; denies straining, dribbling, incontinence   Lab Results  Component Value Date   PSA 2.87 01/09/2021   PSA 2.5 12/06/2019   PSA 2.0 11/05/2017       Medication Review:   Current Outpatient Medications (Cardiovascular):    losartan-hydrochlorothiazide (HYZAAR) 100-25 MG tablet, Take  1 tablet  Daily  for BP & Fluid Retention /Ankle Swelling   pravastatin (PRAVACHOL) 40 MG tablet, TAKE 1 TABLET AT BEDTIME FOR CHOLESTEROL.   rosuvastatin (CRESTOR) 10 MG tablet, Take 1 tablet Daily for Cholesterol  Current Outpatient Medications (Respiratory):    triamcinolone (NASACORT) 55 MCG/ACT AERO nasal inhaler, Place 2 sprays into the nose at bedtime. (Patient taking differently: Place 2 sprays into the nose as needed.)  Current Outpatient Medications (Analgesics):    aspirin EC 81 MG tablet, Take 81  mg by mouth daily.   Current Outpatient Medications (Other):    ALPRAZolam (XANAX) 0.5 MG tablet, Take 1/2 - 1 tablet 2 - 3 x /day ONLY if needed for Anxiety Attack &  limit to 5 days /week to avoid Addiction & Dementia / Patient knows to take by mouth   Alum & Mag Hydroxide-Simeth (MYLANTA PO), Take by mouth as needed.   Cholecalciferol (VITAMIN D3) 5000 units TABS, Take 5,000 Units by mouth daily.   QUEtiapine (SEROQUEL) 25 MG tablet, Take 3 tablets (75 mg total) by mouth at bedtime.   tamsulosin (FLOMAX) 0.4 MG CAPS capsule, Take 1 capsule (0.4 mg total) by mouth daily after supper. For prostate.  Allergies: Allergies  Allergen Reactions   Penicillins Shortness Of Breath, Swelling and Other (See Comments)    Unknown allergic reaction as a teenager   Ppd [Tuberculin Purified Protein Derivative]     Positive test in 2004    Current Problems (verified) has Hyperlipidemia; Hypertension; Abnormal glucose; Vitamin D deficiency; Diverticulosis; Medication management; GERD ; Encounter for Medicare annual wellness exam; CLL (chronic lymphocytic leukemia) ([facility]); Anxiety; Counseling  regarding advance care planning and goals of care; Memory changes; Aortic atherosclerosis ([place]) - per CT 04/2016; BMI 26.0-26.9,adult; Gastric AVM; History of colon polyps; Gait abnormality; Confusion; History of lacunar cerebrovascular accident; Cerebral microvascular disease; BPH with obstruction/lower urinary tract symptoms; First degree AV block; Memory loss; Sleeping difficulty; Agitation; and At high risk for falls on their problem list.  Surgical: He  has a past surgical history that includes No past surgeries. Family His family history includes Cirrhosis in his brother; Colon cancer in his paternal grandfather; Diabetes in his brother; Early death in his father; Stroke in his mother. Social history  He reports that he quit smoking about 18 years ago. His smoking use included cigarettes. He has never used  smokeless tobacco. He reports that he does not currently use alcohol. He reports that he does not use drugs.  Depression/mood screen:   Depression screen [facility] 2/9 01/09/2021  Decreased Interest 0  Down, Depressed, Hopeless 0  PHQ - 2 Score 0  Some recent data might be hidden    MMSE - Mini Mental State Exam 12/20/2020 05/25/2019  Orientation to time 5 5  Orientation to Place 5 5  Registration 2 3  Attention/ Calculation 5 5  Recall 3 2  Language- name 2 objects 2 2  Language- repeat 1 1  Language- follow 3 step command 3 3  Language- read & follow direction 1 1  Write a sentence 1 1  Copy design 0 1  Total score 28 29     Review of Systems  Constitutional:  Positive for diaphoresis (night time/early morning, improved). Negative for malaise/fatigue and weight loss.  HENT:  Negative for hearing loss and tinnitus.   Eyes:  Negative for blurred vision and double vision.  Respiratory:  Negative for cough, shortness of breath and wheezing.   Cardiovascular:  Negative for chest pain, palpitations, orthopnea, claudication and leg swelling.  Gastrointestinal:  Negative for abdominal pain, blood in stool, constipation, diarrhea, heartburn, melena, nausea and vomiting.  Genitourinary: Negative.  Negative for dysuria, flank pain, frequency, hematuria and urgency.       Nocturia 3-4 episodes, slow stream  Musculoskeletal:  Negative for joint pain and myalgias.  Skin:  Negative for rash.  Neurological:  Negative for dizziness, tingling, sensory change, weakness and headaches.  Endo/Heme/Allergies:  Negative for polydipsia.  Psychiatric/Behavioral:  Positive for memory loss. Negative for depression, substance abuse and suicidal ideas. The patient is nervous/anxious. The patient does not have insomnia.   All other systems reviewed and are negative.  Objective:   There were no vitals filed for this visit.  There is no height or weight on file to calculate BMI.   General Appearance: Well  nourished and well groomed and in no apparent distress. Eyes: PERRLA, EOMs, conjunctiva no swelling or erythema Sinuses: No frontal/maxillary tenderness ENT/Mouth: EACs patent / TMs  nl. Nares clear without erythema, swelling, mucoid exudates. Oral hygiene is good. No erythema, swelling, or exudate. Tongue normal, non-obstructing. Tonsils not swollen or erythematous. Hearing normal.  Neck: Supple, thyroid normal. No bruits, nodes or JVD. Respiratory: Respiratory effort normal.  BS equal and clear bilateral without rales, rhonci, wheezing or stridor. Cardio: Heart sounds are normal with regular rate and rhythm and no murmurs, rubs or gallops. Peripheral pulses are normal and equal bilaterally without edema. No aortic or femoral bruits. Chest: symmetric with normal excursions and percussion.  Abdomen: Soft, with Nl bowel sounds. Nontender, no guarding, rebound, hernias, masses, or organomegaly.  Lymphatics: Non tender without  lymphadenopathy.  Genitourinary: Defer Musculoskeletal: Full ROM all peripheral extremities, joint stability, 5/5 strength, and slow steady gait. .  Skin: Warm and dry without rashes, lesions, cyanosis, clubbing or  ecchymosis.  Neuro: Cranial nerves intact, reflexes equal bilaterally. Normal muscle tone, no cerebellar symptoms. Sensation intact.  Pysch: Alert and oriented X 3 with normal affect, insight and judgment appropriate.     Izora Ribas, NP   05/16/2021

## 2021-05-16 ENCOUNTER — Ambulatory Visit: Payer: Medicare HMO | Admitting: Adult Health

## 2021-05-16 DIAGNOSIS — C9110 Chronic lymphocytic leukemia of B-cell type not having achieved remission: Secondary | ICD-10-CM

## 2021-05-16 DIAGNOSIS — F419 Anxiety disorder, unspecified: Secondary | ICD-10-CM

## 2021-05-16 DIAGNOSIS — Z8673 Personal history of transient ischemic attack (TIA), and cerebral infarction without residual deficits: Secondary | ICD-10-CM

## 2021-05-16 DIAGNOSIS — I440 Atrioventricular block, first degree: Secondary | ICD-10-CM | POA: Diagnosis not present

## 2021-05-16 DIAGNOSIS — N138 Other obstructive and reflux uropathy: Secondary | ICD-10-CM | POA: Diagnosis not present

## 2021-05-16 DIAGNOSIS — I10 Essential (primary) hypertension: Secondary | ICD-10-CM | POA: Diagnosis not present

## 2021-05-16 DIAGNOSIS — R7309 Other abnormal glucose: Secondary | ICD-10-CM

## 2021-05-16 DIAGNOSIS — E782 Mixed hyperlipidemia: Secondary | ICD-10-CM

## 2021-05-16 DIAGNOSIS — I700 Atherosclerosis of aorta: Secondary | ICD-10-CM

## 2021-05-16 DIAGNOSIS — N401 Enlarged prostate with lower urinary tract symptoms: Secondary | ICD-10-CM | POA: Diagnosis not present

## 2021-05-16 DIAGNOSIS — Z79899 Other long term (current) drug therapy: Secondary | ICD-10-CM

## 2021-05-16 DIAGNOSIS — F0390 Unspecified dementia without behavioral disturbance: Secondary | ICD-10-CM | POA: Diagnosis not present

## 2021-05-16 DIAGNOSIS — H9193 Unspecified hearing loss, bilateral: Secondary | ICD-10-CM | POA: Diagnosis not present

## 2021-05-16 DIAGNOSIS — R413 Other amnesia: Secondary | ICD-10-CM

## 2021-05-17 DIAGNOSIS — I440 Atrioventricular block, first degree: Secondary | ICD-10-CM | POA: Diagnosis not present

## 2021-05-17 DIAGNOSIS — F0390 Unspecified dementia without behavioral disturbance: Secondary | ICD-10-CM | POA: Diagnosis not present

## 2021-05-17 DIAGNOSIS — H9193 Unspecified hearing loss, bilateral: Secondary | ICD-10-CM | POA: Diagnosis not present

## 2021-05-17 DIAGNOSIS — I10 Essential (primary) hypertension: Secondary | ICD-10-CM | POA: Diagnosis not present

## 2021-05-17 DIAGNOSIS — I700 Atherosclerosis of aorta: Secondary | ICD-10-CM | POA: Diagnosis not present

## 2021-05-17 DIAGNOSIS — N401 Enlarged prostate with lower urinary tract symptoms: Secondary | ICD-10-CM | POA: Diagnosis not present

## 2021-05-17 DIAGNOSIS — N138 Other obstructive and reflux uropathy: Secondary | ICD-10-CM | POA: Diagnosis not present

## 2021-05-17 DIAGNOSIS — F419 Anxiety disorder, unspecified: Secondary | ICD-10-CM | POA: Diagnosis not present

## 2021-05-17 DIAGNOSIS — C9110 Chronic lymphocytic leukemia of B-cell type not having achieved remission: Secondary | ICD-10-CM | POA: Diagnosis not present

## 2021-05-20 NOTE — Progress Notes (Addendum)
FOLLOW UP  Assessment and Plan:  Javier [name] was seen today for follow-up.  Diagnoses and all orders for this visit:  Essential hypertension  - continue medications, DASH diet, exercise and monitor at home. Call if greater than 130/80.   Reminder to go to the ER if any CP, SOB, nausea, dizziness, severe HA, changes vision/speech, left arm numbness and tingling and jaw pain.  Gastric AVM/ BMI 22/ 15 weight loss unintentional in 1 month -     Ambulatory referral to Gastroenterology, urgent -   Pt and wife advised to go to ER immediately if he devleops any of th following You vomit blood. You faint. You have blood in your stools. You develop jaundice. You have severe cramps in your back or abdomen. Your symptoms of upper GI bleeding come back after treatment.  Mixed hyperlipidemia -     COMPLETE METABOLIC PANEL WITH GFR -     Lipid panel  Vascular dementia with behavior disturbance (HCC)  Continue current medications and home health , monitor symptoms.  Gastroesophageal reflux disease, unspecified whether esophagitis present/Diverticulosis  Referred to Dr. Earlean Shawl for further evaluation Not taking meds at this time Discussed diet, avoiding triggers and other lifestyle changes  CLL (chronic lymphocytic leukemia) ([place]) -     CBC with Differential/Platelet  managed by Dr. Susanne Greenhouse x 1 year completed Monitor   Abnormal glucose -     Hemoglobin A1c  Anxiety  Wife states he stays up and paces al night long and will go out on porch in the middle of the night.  Noticed no difference with Seroquel so he has not been taking that medication  Home nurse called and states the patient is using Xanax during the day as well so sleeps during the day and is requesting different medication for daytime. Will start on Sertraline 50 mg qd and Buspar 5 mg 1 tab q 8 hours as needed during the day for anxiety. Spoke with wife and clarified he should only use Xanax at night.   Continue diet and  meds as discussed. Further disposition pending results of labs. Discussed med's effects and SE's.   Over 30 minutes of exam, counseling, chart review, and critical decision making was performed.   Future Appointments  Date Time Provider [place]  06/18/2021  1:30 PM [facility] None  06/18/2021  2:00 PM Brunetta Genera, MD [facility] None  08/13/2021 11:00 AM Rush Landmark, [place] [facility] None  10/29/2021  1:30 PM Marcial Pacas, MD [facility] None  01/22/2022  2:00 PM Unk Pinto, MD [facility] None    ----------------------------------------------------------------------------------------------------------------------  HPI 80 y.o. male  presents for 3 month follow up on hypertension, cholesterol, diabetes, weight and vitamin D deficiency.   BMI is Body mass index is 22.11 kg/m., he has not been working on diet and exercise. His eating has dramatically decreased, eating very little.  Drinks a lot of water. Lots of burping and very little food intake.  Denies nausea/vomiting.  Wt Readings from Last 3 Encounters:  05/21/21 163 lb (73.9 kg)  04/20/21 178 lb (80.7 kg)  03/28/21 163 lb (73.9 kg)   Increased anxiety at night and will go out doors and sit on the porch.  Up and down all night long, feels extremely anxious. He called  911 last week and then did not know he called..A home nursing aide comes out once a week, nurse once a week and physical therapy.   His blood pressure has been controlled at home  running  in 120-130/70-80, today their BP is BP: (!) 148/78. He did not take his BP medicine this morning.  BP Readings from Last 3 Encounters:  05/21/21 (!) 148/78  04/20/21 (!) 151/79  03/28/21 117/66    He does not workout. He denies chest pain, shortness of breath, dizziness.   He is on cholesterol medication Rosuvastatin and denies myalgias. His cholesterol is not at goal. The cholesterol last visit was:   Lab Results  Component Value Date   CHOL 202  (H) 01/09/2021   HDL 48 01/09/2021   LDLCALC 135 (H) 01/09/2021   TRIG 91 01/09/2021   CHOLHDL 4.2 01/09/2021    He has not been working on diet and exercise for prediabetes. Last A1C in the office was:  Lab Results  Component Value Date   HGBA1C 5.6 12/20/2020       Current Medications:  Current Outpatient Medications on File Prior to Visit  Medication Sig   ALPRAZolam (XANAX) 0.5 MG tablet Take 1/2 - 1 tablet 2 - 3 x /day ONLY if needed for Anxiety Attack &  limit to 5 days /week to avoid Addiction & Dementia / Patient knows to take by mouth   Alum & Mag Hydroxide-Simeth (MYLANTA PO) Take by mouth as needed.   aspirin EC 81 MG tablet Take 81 mg by mouth daily.   Cholecalciferol (VITAMIN D3) 5000 units TABS Take 5,000 Units by mouth daily.   losartan-hydrochlorothiazide (HYZAAR) 100-25 MG tablet Take  1 tablet  Daily  for BP & Fluid Retention /Ankle Swelling   pravastatin (PRAVACHOL) 40 MG tablet TAKE 1 TABLET AT BEDTIME FOR CHOLESTEROL.   rosuvastatin (CRESTOR) 10 MG tablet Take 1 tablet Daily for Cholesterol   tamsulosin (FLOMAX) 0.4 MG CAPS capsule Take 1 capsule (0.4 mg total) by mouth daily after supper. For prostate.   QUEtiapine (SEROQUEL) 25 MG tablet Take 3 tablets (75 mg total) by mouth at bedtime. (Patient not taking: Reported on 05/21/2021)   triamcinolone (NASACORT) 55 MCG/ACT AERO nasal inhaler Place 2 sprays into the nose at bedtime. (Patient taking differently: Place 2 sprays into the nose as needed.)   No current facility-administered medications on file prior to visit.     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  HENT:  Negative for congestion, sore throat and tinnitus.   Eyes:  Negative for blurred vision.  Respiratory:  Positive for shortness of breath. Negative for cough and hemoptysis.   Cardiovascular:  Negative for chest pain, palpitations, orthopnea and leg swelling.  Gastrointestinal:  Positive for heartburn. Negative for abdominal pain, blood in stool, constipation, diarrhea, nausea and vomiting.       Burping and 15 pound weight loss in 1 month  Musculoskeletal:  Negative for back pain, joint pain and myalgias.  Skin:  Positive for rash.  Neurological:  Negative for dizziness.  Psychiatric/Behavioral:  Positive for memory loss. The patient is nervous/anxious  and has insomnia.      Physical Exam: BP (!) 148/78   Pulse 85   Temp 97.7 F (36.5 C)   Wt 163 lb (73.9 kg)   SpO2 96%   BMI 22.11 kg/m  Wt Readings from Last 3 Encounters:  05/21/21 163 lb (73.9 kg)  04/20/21 178 lb (80.7 kg)  03/28/21 163 lb (73.9 kg)   General Appearance: Very thin, in no apparent distress. Eyes: PERRLA, EOMs, conjunctiva no swelling or erythema Sinuses: No Frontal/maxillary tenderness ENT/Mouth: Ext aud canals clear, TMs without erythema, bulging. No erythema, swelling, or exudate on post pharynx.  Tonsils not swollen or erythematous. Hearing normal.  Neck: Supple, thyroid normal.  Respiratory: Respiratory effort normal, BS equal bilaterally without rales, rhonchi, wheezing or stridor.  Cardio: RRR with no MRGs. Brisk peripheral pulses without edema.  Abdomen: Soft, + BS.  Non tender, no guarding, rebound, hernias, masses. Lymphatics: Non tender without lymphadenopathy.  Musculoskeletal: Full ROM, Decreased strength Shuffling gait walks with cane Skin: Warm, dry without rashes, lesions, ecchymosis.  Neuro: Cranial  nerves intact. No cerebellar symptoms.  Psych: Awake and oriented to person,  normal affect,    Javier [name] Kathyrn Drown, NP 11:27 AM [facility]

## 2021-05-21 ENCOUNTER — Ambulatory Visit (INDEPENDENT_AMBULATORY_CARE_PROVIDER_SITE_OTHER): Payer: Medicare HMO | Admitting: Nurse Practitioner

## 2021-05-21 ENCOUNTER — Other Ambulatory Visit: Payer: Self-pay

## 2021-05-21 ENCOUNTER — Encounter: Payer: Self-pay | Admitting: Nurse Practitioner

## 2021-05-21 VITALS — BP 148/78 | HR 85 | Temp 97.7°F | Wt 163.0 lb

## 2021-05-21 DIAGNOSIS — I440 Atrioventricular block, first degree: Secondary | ICD-10-CM | POA: Diagnosis not present

## 2021-05-21 DIAGNOSIS — K5790 Diverticulosis of intestine, part unspecified, without perforation or abscess without bleeding: Secondary | ICD-10-CM

## 2021-05-21 DIAGNOSIS — Z6822 Body mass index (BMI) 22.0-22.9, adult: Secondary | ICD-10-CM | POA: Diagnosis not present

## 2021-05-21 DIAGNOSIS — R7309 Other abnormal glucose: Secondary | ICD-10-CM | POA: Diagnosis not present

## 2021-05-21 DIAGNOSIS — C9110 Chronic lymphocytic leukemia of B-cell type not having achieved remission: Secondary | ICD-10-CM | POA: Diagnosis not present

## 2021-05-21 DIAGNOSIS — N138 Other obstructive and reflux uropathy: Secondary | ICD-10-CM

## 2021-05-21 DIAGNOSIS — H9193 Unspecified hearing loss, bilateral: Secondary | ICD-10-CM | POA: Diagnosis not present

## 2021-05-21 DIAGNOSIS — I10 Essential (primary) hypertension: Secondary | ICD-10-CM | POA: Diagnosis not present

## 2021-05-21 DIAGNOSIS — E782 Mixed hyperlipidemia: Secondary | ICD-10-CM | POA: Diagnosis not present

## 2021-05-21 DIAGNOSIS — K31819 Angiodysplasia of stomach and duodenum without bleeding: Secondary | ICD-10-CM | POA: Diagnosis not present

## 2021-05-21 DIAGNOSIS — F0151 Vascular dementia with behavioral disturbance: Secondary | ICD-10-CM

## 2021-05-21 DIAGNOSIS — K219 Gastro-esophageal reflux disease without esophagitis: Secondary | ICD-10-CM | POA: Diagnosis not present

## 2021-05-21 DIAGNOSIS — N401 Enlarged prostate with lower urinary tract symptoms: Secondary | ICD-10-CM | POA: Diagnosis not present

## 2021-05-21 DIAGNOSIS — F0390 Unspecified dementia without behavioral disturbance: Secondary | ICD-10-CM | POA: Diagnosis not present

## 2021-05-21 DIAGNOSIS — F01518 Vascular dementia, unspecified severity, with other behavioral disturbance: Secondary | ICD-10-CM

## 2021-05-21 DIAGNOSIS — F419 Anxiety disorder, unspecified: Secondary | ICD-10-CM | POA: Diagnosis not present

## 2021-05-21 DIAGNOSIS — I700 Atherosclerosis of aorta: Secondary | ICD-10-CM | POA: Diagnosis not present

## 2021-05-21 MED ORDER — SERTRALINE HCL 50 MG PO TABS: 50.0000 mg | ORAL_TABLET | Freq: Every day | ORAL | 2 refills | Status: DC

## 2021-05-21 MED ORDER — BUSPIRONE HCL 5 MG PO TABS: 5.0000 mg | ORAL_TABLET | Freq: Three times a day (TID) | ORAL | 2 refills | Status: DC

## 2021-05-21 NOTE — Patient Instructions (Signed)
https://www.ncbi.nlm.nih.gov/books/NBK537291/#:~:text=Interventional%20Radiology-,Deterrence%20and%20Patient%20Education,also%20known%20as%20the%20colon)."> https://www.ncbi.nlm.nih.gov/books/NBK470300/">  Upper Gastrointestinal Bleeding  Upper gastrointestinal (GI) bleeding is bleeding from the esophagus, the stomach, or the first part of the small intestine (duodenum). The esophagus is the part of the body that moves food from the mouth to the stomach. Upper GI bleeding may cause a person to vomit blood or have bloody or black stools (feces). Bleeding can range from mild to serious or even life-threatening. Continued orheavy bleeding may need emergency treatment at a hospital. What are the causes? This condition may be caused by: A sore in the lining of your stomach or duodenum (peptic ulcer). This is the most common cause of GI bleeding. Esophagitis, or inflammation of your esophagus. A tear in the esophagus. Cancer of the esophagus, stomach, or duodenum. An abnormal or weakened blood vessel in one of your upper GI structures. A disorder that makes it hard to form blood clots and causes easy bleeding (coagulopathy). What increases the risk? The following factors may make you more likely to develop this condition: Being older than age 80. Being male. Having certain medical conditions. These may include: Liver or kidney disease. Stomach infection caused by Helicobacter pylori bacteria. Having frequent or severe vomiting. Heavy use of alcohol. Regular use of aspirin or NSAIDs, such as ibuprofen. Taking blood thinners (anticoagulants) or anti-platelet medicines. What are the signs or symptoms? Symptoms of this condition include: Vomiting blood that may be bright red or the color of coffee grounds. Black or maroon-colored stools, or bloody stools. Racing heartbeat, weakness, or dizziness. Heartburn or pain in your abdomen. Trouble swallowing. Weight loss. Skin or the white parts of your  eyes turning yellow (jaundice). How is this diagnosed? This condition may be diagnosed based on: Your symptoms and medical history. A physical exam. During the exam, your health care provider will check for signs of blood loss, such as low blood pressure and a fast pulse. Tests or procedures. These may include: Blood tests to check for low blood cell count and other signs of blood loss, and to check clotting ability. Blood tests to check your liver and kidney function. A chest X-ray to look for a tear in the esophagus. Endoscopy. In this procedure, a flexible scope is put down your esophagus and into your stomach or duodenum to look for the source of bleeding. Angiogram. This may be done if the source of bleeding is not found during endoscopy. For an angiogram, X-rays are taken after a dye is injected into your bloodstream. Nasogastric tube insertion. This is a tube passed through your nose and down into your stomach. It may be connected to a source of gentle suction to see if any blood comes out. How is this treated? Treatment for this condition depends on the cause of your bleeding. Active bleeding is treated at the hospital. Treatment may include: Getting fluids through an IV inserted into one of your veins. Getting blood through an IV (blood transfusion). Getting high doses of medicine through the IV to lower stomach acid. This may be done to treat ulcer disease. Having endoscopy to treat an area of bleeding with high heat (coagulation), injections, or surgical clips. Having a procedure that involves first doing an angiogram and then blocking blood flow to the bleeding site (embolization). Stopping or changing some of your regular medicines for a certain amount of time. Having other surgical procedures if initial treatments do not control bleeding. Follow these instructions at home: Eating and drinking  Follow instructions from your health care provider about eating or drinking  restrictions. Try to eat foods that have a lot of iron. These may include red meat, shellfish, chicken, eggs, and vegetables such as spinach or broccoli. Drink enough fluid to keep your urine pale yellow. Do not drink alcohol.  General instructions  Do not use any products that contain nicotine or tobacco, such as cigarettes, e-cigarettes, and chewing tobacco. If you need help quitting, ask your health care provider. Take over-the-counter and prescription medicines only as told by your health care provider. You may need to avoid aspirin, NSAIDs, or other medicines that increase bleeding. Return to your normal activities as told by your health care provider. Ask your health care provider what activities are safe for you. Keep all follow-up visits as told by your health care provider. This is important.  Contact a health care provider if: You have heartburn or pain in your abdomen. You lose weight without trying. You have trouble swallowing. You vomit often. You feel weak or dizzy. You need help to stop smoking or drinking alcohol. Get help right away if: You vomit blood. You faint. You have blood in your stools. You develop jaundice. You have severe cramps in your back or abdomen. Your symptoms of upper GI bleeding come back after treatment. These symptoms may represent a serious problem that is an emergency. Do not wait to see if the symptoms will go away. Get medical help right away. Call your local emergency services (911 in the [place] Do not drive yourself to the hospital. Summary If you have upper gastrointestinal (GI) bleeding, you may vomit blood or have bloody or black stools (feces). Follow instructions from your health care provider about eating or drinking restrictions. Do not drink alcohol. Do not use any products that contain nicotine or tobacco, such as cigarettes, e-cigarettes, and chewing tobacco. Take over-the-counter and prescription medicines only as told by your  health care provider. You may need to avoid NSAIDs or other medicines that increase bleeding. This information is not intended to replace advice given to you by your health care provider. Make sure you discuss any questions you have with your healthcare provider. Document Revised: 09/10/2019 Document Reviewed: 09/10/2019 Elsevier Patient Education  2022 [employer].

## 2021-05-21 NOTE — Addendum Note (Signed)
Addended by: Magda Bernheim on: 05/21/2021 04:37 PM   Modules accepted: Orders

## 2021-05-22 ENCOUNTER — Encounter: Payer: Self-pay | Admitting: Hematology

## 2021-05-22 DIAGNOSIS — I10 Essential (primary) hypertension: Secondary | ICD-10-CM | POA: Diagnosis not present

## 2021-05-22 DIAGNOSIS — I440 Atrioventricular block, first degree: Secondary | ICD-10-CM | POA: Diagnosis not present

## 2021-05-22 DIAGNOSIS — N401 Enlarged prostate with lower urinary tract symptoms: Secondary | ICD-10-CM | POA: Diagnosis not present

## 2021-05-22 DIAGNOSIS — N138 Other obstructive and reflux uropathy: Secondary | ICD-10-CM | POA: Diagnosis not present

## 2021-05-22 DIAGNOSIS — I700 Atherosclerosis of aorta: Secondary | ICD-10-CM | POA: Diagnosis not present

## 2021-05-22 DIAGNOSIS — H9193 Unspecified hearing loss, bilateral: Secondary | ICD-10-CM | POA: Diagnosis not present

## 2021-05-22 DIAGNOSIS — F419 Anxiety disorder, unspecified: Secondary | ICD-10-CM | POA: Diagnosis not present

## 2021-05-22 DIAGNOSIS — C9110 Chronic lymphocytic leukemia of B-cell type not having achieved remission: Secondary | ICD-10-CM | POA: Diagnosis not present

## 2021-05-22 DIAGNOSIS — H401131 Primary open-angle glaucoma, bilateral, mild stage: Secondary | ICD-10-CM | POA: Diagnosis not present

## 2021-05-22 DIAGNOSIS — H2513 Age-related nuclear cataract, bilateral: Secondary | ICD-10-CM | POA: Diagnosis not present

## 2021-05-22 DIAGNOSIS — F0390 Unspecified dementia without behavioral disturbance: Secondary | ICD-10-CM | POA: Diagnosis not present

## 2021-05-22 LAB — LIPID PANEL
Cholesterol: 160 mg/dL (ref <200)
HDL: 45 mg/dL (ref >=40)
LDL Cholesterol (Calc): 98 mg/dL (calc)
Non-HDL Cholesterol (Calc): 115 mg/dL (calc) (ref <130)
Total CHOL/HDL Ratio: 3.6 (calc) (ref <5.0)
Triglycerides: 83 mg/dL (ref <150)

## 2021-05-22 LAB — CBC WITH DIFFERENTIAL/PLATELET
Absolute Monocytes: 367 cells/uL (ref 200–950)
Basophils Absolute: 19 cells/uL (ref 0–200)
Basophils Relative: 0.4 %
Eosinophils Absolute: 9 cells/uL — ABNORMAL LOW (ref 15–500)
Eosinophils Relative: 0.2 %
HCT: 42.3 % (ref 38.5–50.0)
Hemoglobin: 13.8 g/dL (ref 13.2–17.1)
Lymphs Abs: 1575 cells/uL (ref 850–3900)
MCH: 30.1 pg (ref 27.0–33.0)
MCHC: 32.6 g/dL (ref 32.0–36.0)
MCV: 92.4 fL (ref 80.0–100.0)
MPV: 12.1 fL (ref 7.5–12.5)
Monocytes Relative: 7.8 %
Neutro Abs: 2731 cells/uL (ref 1500–7800)
Neutrophils Relative %: 58.1 %
Platelets: 159 10*3/uL (ref 140–400)
RBC: 4.58 10*6/uL (ref 4.20–5.80)
RDW: 12.8 % (ref 11.0–15.0)
Total Lymphocyte: 33.5 %
WBC: 4.7 10*3/uL (ref 3.8–10.8)

## 2021-05-22 LAB — COMPLETE METABOLIC PANEL WITH GFR
AG Ratio: 1.8 (calc) (ref 1.0–2.5)
ALT: 8 U/L — ABNORMAL LOW (ref 9–46)
AST: 12 U/L (ref 10–35)
Albumin: 4.4 g/dL (ref 3.6–5.1)
Alkaline phosphatase (APISO): 64 U/L (ref 35–144)
BUN: 17 mg/dL (ref 7–25)
CO2: 28 mmol/L (ref 20–32)
Calcium: 10.2 mg/dL (ref 8.6–10.3)
Chloride: 105 mmol/L (ref 98–110)
Creat: 1 mg/dL (ref 0.70–1.22)
Globulin: 2.4 g/dL (calc) (ref 1.9–3.7)
Glucose, Bld: 89 mg/dL (ref 65–99)
Potassium: 4.2 mmol/L (ref 3.5–5.3)
Sodium: 141 mmol/L (ref 135–146)
Total Bilirubin: 0.7 mg/dL (ref 0.2–1.2)
Total Protein: 6.8 g/dL (ref 6.1–8.1)
eGFR: 76 mL/min/{1.73_m2} (ref >=60)

## 2021-05-22 LAB — HEMOGLOBIN A1C
Hgb A1c MFr Bld: 5.3 % of total Hgb (ref <5.7)
Mean Plasma Glucose: 105 mg/dL
eAG (mmol/L): 5.8 mmol/L

## 2021-05-23 ENCOUNTER — Ambulatory Visit: Payer: Medicare HMO | Admitting: Adult Health

## 2021-05-25 DIAGNOSIS — I10 Essential (primary) hypertension: Secondary | ICD-10-CM | POA: Diagnosis not present

## 2021-05-25 DIAGNOSIS — N401 Enlarged prostate with lower urinary tract symptoms: Secondary | ICD-10-CM | POA: Diagnosis not present

## 2021-05-25 DIAGNOSIS — F0390 Unspecified dementia without behavioral disturbance: Secondary | ICD-10-CM | POA: Diagnosis not present

## 2021-05-25 DIAGNOSIS — F419 Anxiety disorder, unspecified: Secondary | ICD-10-CM | POA: Diagnosis not present

## 2021-05-25 DIAGNOSIS — I440 Atrioventricular block, first degree: Secondary | ICD-10-CM | POA: Diagnosis not present

## 2021-05-25 DIAGNOSIS — I700 Atherosclerosis of aorta: Secondary | ICD-10-CM | POA: Diagnosis not present

## 2021-05-25 DIAGNOSIS — N138 Other obstructive and reflux uropathy: Secondary | ICD-10-CM | POA: Diagnosis not present

## 2021-05-25 DIAGNOSIS — H9193 Unspecified hearing loss, bilateral: Secondary | ICD-10-CM | POA: Diagnosis not present

## 2021-05-25 DIAGNOSIS — C9110 Chronic lymphocytic leukemia of B-cell type not having achieved remission: Secondary | ICD-10-CM | POA: Diagnosis not present

## 2021-05-28 DIAGNOSIS — F419 Anxiety disorder, unspecified: Secondary | ICD-10-CM | POA: Diagnosis not present

## 2021-05-28 DIAGNOSIS — F0390 Unspecified dementia without behavioral disturbance: Secondary | ICD-10-CM | POA: Diagnosis not present

## 2021-05-28 DIAGNOSIS — I440 Atrioventricular block, first degree: Secondary | ICD-10-CM | POA: Diagnosis not present

## 2021-05-28 DIAGNOSIS — N138 Other obstructive and reflux uropathy: Secondary | ICD-10-CM | POA: Diagnosis not present

## 2021-05-28 DIAGNOSIS — N401 Enlarged prostate with lower urinary tract symptoms: Secondary | ICD-10-CM | POA: Diagnosis not present

## 2021-05-28 DIAGNOSIS — C9110 Chronic lymphocytic leukemia of B-cell type not having achieved remission: Secondary | ICD-10-CM | POA: Diagnosis not present

## 2021-05-28 DIAGNOSIS — I10 Essential (primary) hypertension: Secondary | ICD-10-CM | POA: Diagnosis not present

## 2021-05-28 DIAGNOSIS — I700 Atherosclerosis of aorta: Secondary | ICD-10-CM | POA: Diagnosis not present

## 2021-05-28 DIAGNOSIS — H9193 Unspecified hearing loss, bilateral: Secondary | ICD-10-CM | POA: Diagnosis not present

## 2021-05-29 DIAGNOSIS — K219 Gastro-esophageal reflux disease without esophagitis: Secondary | ICD-10-CM | POA: Diagnosis not present

## 2021-05-29 DIAGNOSIS — I10 Essential (primary) hypertension: Secondary | ICD-10-CM | POA: Diagnosis not present

## 2021-05-29 DIAGNOSIS — E785 Hyperlipidemia, unspecified: Secondary | ICD-10-CM | POA: Diagnosis not present

## 2021-05-30 DIAGNOSIS — N401 Enlarged prostate with lower urinary tract symptoms: Secondary | ICD-10-CM | POA: Diagnosis not present

## 2021-05-30 DIAGNOSIS — C9110 Chronic lymphocytic leukemia of B-cell type not having achieved remission: Secondary | ICD-10-CM | POA: Diagnosis not present

## 2021-05-30 DIAGNOSIS — F419 Anxiety disorder, unspecified: Secondary | ICD-10-CM | POA: Diagnosis not present

## 2021-05-30 DIAGNOSIS — I700 Atherosclerosis of aorta: Secondary | ICD-10-CM | POA: Diagnosis not present

## 2021-05-30 DIAGNOSIS — H9193 Unspecified hearing loss, bilateral: Secondary | ICD-10-CM | POA: Diagnosis not present

## 2021-05-30 DIAGNOSIS — N138 Other obstructive and reflux uropathy: Secondary | ICD-10-CM | POA: Diagnosis not present

## 2021-05-30 DIAGNOSIS — F0390 Unspecified dementia without behavioral disturbance: Secondary | ICD-10-CM | POA: Diagnosis not present

## 2021-05-30 DIAGNOSIS — I10 Essential (primary) hypertension: Secondary | ICD-10-CM | POA: Diagnosis not present

## 2021-05-30 DIAGNOSIS — I440 Atrioventricular block, first degree: Secondary | ICD-10-CM | POA: Diagnosis not present

## 2021-05-31 DIAGNOSIS — N138 Other obstructive and reflux uropathy: Secondary | ICD-10-CM | POA: Diagnosis not present

## 2021-05-31 DIAGNOSIS — I10 Essential (primary) hypertension: Secondary | ICD-10-CM | POA: Diagnosis not present

## 2021-05-31 DIAGNOSIS — N401 Enlarged prostate with lower urinary tract symptoms: Secondary | ICD-10-CM | POA: Diagnosis not present

## 2021-05-31 DIAGNOSIS — I440 Atrioventricular block, first degree: Secondary | ICD-10-CM | POA: Diagnosis not present

## 2021-05-31 DIAGNOSIS — H9193 Unspecified hearing loss, bilateral: Secondary | ICD-10-CM | POA: Diagnosis not present

## 2021-05-31 DIAGNOSIS — F0390 Unspecified dementia without behavioral disturbance: Secondary | ICD-10-CM | POA: Diagnosis not present

## 2021-05-31 DIAGNOSIS — C9110 Chronic lymphocytic leukemia of B-cell type not having achieved remission: Secondary | ICD-10-CM | POA: Diagnosis not present

## 2021-05-31 DIAGNOSIS — I700 Atherosclerosis of aorta: Secondary | ICD-10-CM | POA: Diagnosis not present

## 2021-05-31 DIAGNOSIS — F419 Anxiety disorder, unspecified: Secondary | ICD-10-CM | POA: Diagnosis not present

## 2021-06-05 DIAGNOSIS — H9193 Unspecified hearing loss, bilateral: Secondary | ICD-10-CM | POA: Diagnosis not present

## 2021-06-05 DIAGNOSIS — F0390 Unspecified dementia without behavioral disturbance: Secondary | ICD-10-CM | POA: Diagnosis not present

## 2021-06-05 DIAGNOSIS — I700 Atherosclerosis of aorta: Secondary | ICD-10-CM | POA: Diagnosis not present

## 2021-06-05 DIAGNOSIS — I440 Atrioventricular block, first degree: Secondary | ICD-10-CM | POA: Diagnosis not present

## 2021-06-05 DIAGNOSIS — N138 Other obstructive and reflux uropathy: Secondary | ICD-10-CM | POA: Diagnosis not present

## 2021-06-05 DIAGNOSIS — F419 Anxiety disorder, unspecified: Secondary | ICD-10-CM | POA: Diagnosis not present

## 2021-06-05 DIAGNOSIS — C9110 Chronic lymphocytic leukemia of B-cell type not having achieved remission: Secondary | ICD-10-CM | POA: Diagnosis not present

## 2021-06-05 DIAGNOSIS — I10 Essential (primary) hypertension: Secondary | ICD-10-CM | POA: Diagnosis not present

## 2021-06-05 DIAGNOSIS — N401 Enlarged prostate with lower urinary tract symptoms: Secondary | ICD-10-CM | POA: Diagnosis not present

## 2021-06-06 DIAGNOSIS — H9193 Unspecified hearing loss, bilateral: Secondary | ICD-10-CM | POA: Diagnosis not present

## 2021-06-06 DIAGNOSIS — N401 Enlarged prostate with lower urinary tract symptoms: Secondary | ICD-10-CM | POA: Diagnosis not present

## 2021-06-06 DIAGNOSIS — F0390 Unspecified dementia without behavioral disturbance: Secondary | ICD-10-CM | POA: Diagnosis not present

## 2021-06-06 DIAGNOSIS — I700 Atherosclerosis of aorta: Secondary | ICD-10-CM | POA: Diagnosis not present

## 2021-06-06 DIAGNOSIS — C9110 Chronic lymphocytic leukemia of B-cell type not having achieved remission: Secondary | ICD-10-CM | POA: Diagnosis not present

## 2021-06-06 DIAGNOSIS — I440 Atrioventricular block, first degree: Secondary | ICD-10-CM | POA: Diagnosis not present

## 2021-06-06 DIAGNOSIS — I10 Essential (primary) hypertension: Secondary | ICD-10-CM | POA: Diagnosis not present

## 2021-06-06 DIAGNOSIS — N138 Other obstructive and reflux uropathy: Secondary | ICD-10-CM | POA: Diagnosis not present

## 2021-06-06 DIAGNOSIS — F419 Anxiety disorder, unspecified: Secondary | ICD-10-CM | POA: Diagnosis not present

## 2021-06-07 DIAGNOSIS — N401 Enlarged prostate with lower urinary tract symptoms: Secondary | ICD-10-CM | POA: Diagnosis not present

## 2021-06-07 DIAGNOSIS — I700 Atherosclerosis of aorta: Secondary | ICD-10-CM | POA: Diagnosis not present

## 2021-06-07 DIAGNOSIS — I440 Atrioventricular block, first degree: Secondary | ICD-10-CM | POA: Diagnosis not present

## 2021-06-07 DIAGNOSIS — F419 Anxiety disorder, unspecified: Secondary | ICD-10-CM | POA: Diagnosis not present

## 2021-06-07 DIAGNOSIS — I10 Essential (primary) hypertension: Secondary | ICD-10-CM | POA: Diagnosis not present

## 2021-06-07 DIAGNOSIS — N138 Other obstructive and reflux uropathy: Secondary | ICD-10-CM | POA: Diagnosis not present

## 2021-06-07 DIAGNOSIS — C9110 Chronic lymphocytic leukemia of B-cell type not having achieved remission: Secondary | ICD-10-CM | POA: Diagnosis not present

## 2021-06-07 DIAGNOSIS — H9193 Unspecified hearing loss, bilateral: Secondary | ICD-10-CM | POA: Diagnosis not present

## 2021-06-07 DIAGNOSIS — F0390 Unspecified dementia without behavioral disturbance: Secondary | ICD-10-CM | POA: Diagnosis not present

## 2021-06-10 ENCOUNTER — Other Ambulatory Visit: Payer: Self-pay | Admitting: Internal Medicine

## 2021-06-10 DIAGNOSIS — F419 Anxiety disorder, unspecified: Secondary | ICD-10-CM

## 2021-06-10 MED ORDER — ALPRAZOLAM 0.5 MG PO TABS: ORAL_TABLET | ORAL | 0 refills | Status: DC

## 2021-06-11 ENCOUNTER — Other Ambulatory Visit: Payer: Self-pay | Admitting: Internal Medicine

## 2021-06-11 DIAGNOSIS — F419 Anxiety disorder, unspecified: Secondary | ICD-10-CM

## 2021-06-11 MED ORDER — ALPRAZOLAM 0.5 MG PO TABS: ORAL_TABLET | ORAL | 0 refills | Status: DC

## 2021-06-12 DIAGNOSIS — R634 Abnormal weight loss: Secondary | ICD-10-CM | POA: Diagnosis not present

## 2021-06-12 DIAGNOSIS — R1111 Vomiting without nausea: Secondary | ICD-10-CM | POA: Diagnosis not present

## 2021-06-12 DIAGNOSIS — R1013 Epigastric pain: Secondary | ICD-10-CM | POA: Diagnosis not present

## 2021-06-14 DIAGNOSIS — N401 Enlarged prostate with lower urinary tract symptoms: Secondary | ICD-10-CM | POA: Diagnosis not present

## 2021-06-14 DIAGNOSIS — I700 Atherosclerosis of aorta: Secondary | ICD-10-CM | POA: Diagnosis not present

## 2021-06-14 DIAGNOSIS — F419 Anxiety disorder, unspecified: Secondary | ICD-10-CM | POA: Diagnosis not present

## 2021-06-14 DIAGNOSIS — C9110 Chronic lymphocytic leukemia of B-cell type not having achieved remission: Secondary | ICD-10-CM | POA: Diagnosis not present

## 2021-06-14 DIAGNOSIS — F0390 Unspecified dementia without behavioral disturbance: Secondary | ICD-10-CM | POA: Diagnosis not present

## 2021-06-14 DIAGNOSIS — I10 Essential (primary) hypertension: Secondary | ICD-10-CM | POA: Diagnosis not present

## 2021-06-14 DIAGNOSIS — H9193 Unspecified hearing loss, bilateral: Secondary | ICD-10-CM | POA: Diagnosis not present

## 2021-06-14 DIAGNOSIS — I440 Atrioventricular block, first degree: Secondary | ICD-10-CM | POA: Diagnosis not present

## 2021-06-14 DIAGNOSIS — N138 Other obstructive and reflux uropathy: Secondary | ICD-10-CM | POA: Diagnosis not present

## 2021-06-17 ENCOUNTER — Other Ambulatory Visit: Payer: Self-pay

## 2021-06-17 DIAGNOSIS — C9110 Chronic lymphocytic leukemia of B-cell type not having achieved remission: Secondary | ICD-10-CM

## 2021-06-17 DIAGNOSIS — R634 Abnormal weight loss: Secondary | ICD-10-CM | POA: Diagnosis not present

## 2021-06-17 DIAGNOSIS — R1013 Epigastric pain: Secondary | ICD-10-CM | POA: Diagnosis not present

## 2021-06-18 ENCOUNTER — Inpatient Hospital Stay: Payer: Medicare HMO

## 2021-06-18 ENCOUNTER — Inpatient Hospital Stay: Payer: Medicare HMO | Attending: Hematology | Admitting: Hematology

## 2021-06-18 DIAGNOSIS — F4323 Adjustment disorder with mixed anxiety and depressed mood: Secondary | ICD-10-CM | POA: Diagnosis not present

## 2021-06-19 DIAGNOSIS — Z01818 Encounter for other preprocedural examination: Secondary | ICD-10-CM | POA: Diagnosis not present

## 2021-06-19 DIAGNOSIS — H2512 Age-related nuclear cataract, left eye: Secondary | ICD-10-CM | POA: Diagnosis not present

## 2021-06-19 DIAGNOSIS — H2511 Age-related nuclear cataract, right eye: Secondary | ICD-10-CM | POA: Diagnosis not present

## 2021-06-19 DIAGNOSIS — H402234 Chronic angle-closure glaucoma, bilateral, indeterminate stage: Secondary | ICD-10-CM | POA: Diagnosis not present

## 2021-06-21 DIAGNOSIS — I10 Essential (primary) hypertension: Secondary | ICD-10-CM | POA: Diagnosis not present

## 2021-06-21 DIAGNOSIS — F0390 Unspecified dementia without behavioral disturbance: Secondary | ICD-10-CM | POA: Diagnosis not present

## 2021-06-21 DIAGNOSIS — N138 Other obstructive and reflux uropathy: Secondary | ICD-10-CM | POA: Diagnosis not present

## 2021-06-21 DIAGNOSIS — H9193 Unspecified hearing loss, bilateral: Secondary | ICD-10-CM | POA: Diagnosis not present

## 2021-06-21 DIAGNOSIS — C9110 Chronic lymphocytic leukemia of B-cell type not having achieved remission: Secondary | ICD-10-CM | POA: Diagnosis not present

## 2021-06-21 DIAGNOSIS — I440 Atrioventricular block, first degree: Secondary | ICD-10-CM | POA: Diagnosis not present

## 2021-06-21 DIAGNOSIS — N401 Enlarged prostate with lower urinary tract symptoms: Secondary | ICD-10-CM | POA: Diagnosis not present

## 2021-06-21 DIAGNOSIS — I700 Atherosclerosis of aorta: Secondary | ICD-10-CM | POA: Diagnosis not present

## 2021-06-21 DIAGNOSIS — F419 Anxiety disorder, unspecified: Secondary | ICD-10-CM | POA: Diagnosis not present

## 2021-06-25 ENCOUNTER — Telehealth: Payer: Self-pay

## 2021-06-25 NOTE — Progress Notes (Signed)
Reviewed chart prior to disease state call. Spoke with patient regarding BP  Recent Office Vitals: BP Readings from Last 3 Encounters:  05/21/21 (!) 148/78  04/20/21 (!) 151/79  03/28/21 117/66   Pulse Readings from Last 3 Encounters:  05/21/21 85  04/20/21 67  03/28/21 70    Wt Readings from Last 3 Encounters:  05/21/21 163 lb (73.9 kg)  04/20/21 178 lb (80.7 kg)  03/28/21 163 lb (73.9 kg)     Kidney Function Lab Results  Component Value Date/Time   CREATININE 1.00 05/21/2021 11:20 AM   CREATININE 1.13 04/20/2021 03:53 PM   CREATININE 1.14 (H) 03/21/2021 03:12 PM   CREATININE 1.1 10/21/2016 01:24 PM   GFRNONAA >60 04/20/2021 03:53 PM   GFRNONAA 60 03/21/2021 03:12 PM   GFRAA 70 03/21/2021 03:12 PM    BMP Latest Ref Rng & Units 05/21/2021 04/20/2021 03/21/2021  Glucose 65 - 99 mg/dL 89 88 109(H)  BUN 7 - 25 mg/dL 17 11 21   Creatinine 0.70 - 1.22 mg/dL 1.00 1.13 1.14(H)  BUN/Creat Ratio 6 - 22 (calc) NOT APPLICABLE - 18  Sodium 135 - 146 mmol/L 141 140 144  Potassium 3.5 - 5.3 mmol/L 4.2 4.4 3.9  Chloride 98 - 110 mmol/L 105 107 105  CO2 20 - 32 mmol/L 28 25 30   Calcium 8.6 - 10.3 mg/dL 10.2 9.0 10.5(H)    Current antihypertensive regimen:  Losartan-Hydrochlorothiazide 100-25mg , 1 tab daily How often are you checking your Blood Pressure? daily Current home BP readings: 130/70 06/24/21 What recent interventions/DTPs have been made by any provider to improve Blood Pressure control since last CPP Visit:   -05/21/2021- Marta Lamas, NP(Dr. Melford Aase)- Patient presented for follow up. BP 148/78, Pulse 85. Start Buspirone 5mg , Sertraline 50mg  Stopped Quetiapine 75mg .   Any recent hospitalizations or ED visits since last visit with CPP? No What diet changes have been made to improve Blood Pressure Control?  Low sodium What exercise is being done to improve your Blood Pressure Control?  Walking   Adherence Review: Is the patient currently on ACE/ARB medication? Yes Does the  patient have >5 day gap between last estimated fill dates? No   Hildred Alamin, CMA

## 2021-06-28 DIAGNOSIS — H2511 Age-related nuclear cataract, right eye: Secondary | ICD-10-CM | POA: Diagnosis not present

## 2021-06-28 DIAGNOSIS — H402234 Chronic angle-closure glaucoma, bilateral, indeterminate stage: Secondary | ICD-10-CM | POA: Diagnosis not present

## 2021-07-02 DIAGNOSIS — F4323 Adjustment disorder with mixed anxiety and depressed mood: Secondary | ICD-10-CM | POA: Diagnosis not present

## 2021-07-15 ENCOUNTER — Other Ambulatory Visit: Payer: Self-pay | Admitting: Internal Medicine

## 2021-07-15 DIAGNOSIS — F419 Anxiety disorder, unspecified: Secondary | ICD-10-CM

## 2021-07-15 MED ORDER — ALPRAZOLAM 0.5 MG PO TABS: ORAL_TABLET | ORAL | 0 refills | Status: DC

## 2021-07-17 DIAGNOSIS — R634 Abnormal weight loss: Secondary | ICD-10-CM | POA: Diagnosis not present

## 2021-07-17 DIAGNOSIS — K8681 Exocrine pancreatic insufficiency: Secondary | ICD-10-CM | POA: Diagnosis not present

## 2021-07-17 DIAGNOSIS — Z227 Latent tuberculosis: Secondary | ICD-10-CM | POA: Diagnosis not present

## 2021-07-17 DIAGNOSIS — R61 Generalized hyperhidrosis: Secondary | ICD-10-CM | POA: Diagnosis not present

## 2021-07-17 DIAGNOSIS — R1013 Epigastric pain: Secondary | ICD-10-CM | POA: Diagnosis not present

## 2021-07-25 DIAGNOSIS — R1013 Epigastric pain: Secondary | ICD-10-CM | POA: Diagnosis not present

## 2021-07-25 DIAGNOSIS — R109 Unspecified abdominal pain: Secondary | ICD-10-CM | POA: Diagnosis not present

## 2021-07-25 DIAGNOSIS — R634 Abnormal weight loss: Secondary | ICD-10-CM | POA: Diagnosis not present

## 2021-07-25 DIAGNOSIS — R142 Eructation: Secondary | ICD-10-CM | POA: Diagnosis not present

## 2021-08-01 ENCOUNTER — Encounter: Payer: Self-pay | Admitting: Internal Medicine

## 2021-08-09 ENCOUNTER — Observation Stay (HOSPITAL_COMMUNITY): Payer: No Typology Code available for payment source

## 2021-08-09 ENCOUNTER — Inpatient Hospital Stay (HOSPITAL_COMMUNITY)
Admission: EM | Admit: 2021-08-09 | Discharge: 2021-08-20 | DRG: 069 | Disposition: A | Discharge status: Skilled Nursing Facility | Payer: No Typology Code available for payment source | Attending: Family Medicine | Admitting: Family Medicine

## 2021-08-09 ENCOUNTER — Emergency Department (HOSPITAL_COMMUNITY): Payer: No Typology Code available for payment source

## 2021-08-09 ENCOUNTER — Other Ambulatory Visit: Payer: Self-pay

## 2021-08-09 DIAGNOSIS — F0150 Vascular dementia without behavioral disturbance: Secondary | ICD-10-CM | POA: Diagnosis present

## 2021-08-09 DIAGNOSIS — R531 Weakness: Secondary | ICD-10-CM | POA: Diagnosis not present

## 2021-08-09 DIAGNOSIS — W010XXA Fall on same level from slipping, tripping and stumbling without subsequent striking against object, initial encounter: Secondary | ICD-10-CM | POA: Diagnosis present

## 2021-08-09 DIAGNOSIS — G459 Transient cerebral ischemic attack, unspecified: Secondary | ICD-10-CM | POA: Diagnosis not present

## 2021-08-09 DIAGNOSIS — W19XXXA Unspecified fall, initial encounter: Secondary | ICD-10-CM | POA: Diagnosis not present

## 2021-08-09 DIAGNOSIS — M25512 Pain in left shoulder: Secondary | ICD-10-CM | POA: Diagnosis not present

## 2021-08-09 DIAGNOSIS — F01A0 Vascular dementia, mild, without behavioral disturbance, psychotic disturbance, mood disturbance, and anxiety: Secondary | ICD-10-CM | POA: Diagnosis present

## 2021-08-09 DIAGNOSIS — I6782 Cerebral ischemia: Secondary | ICD-10-CM | POA: Diagnosis not present

## 2021-08-09 DIAGNOSIS — C9111 Chronic lymphocytic leukemia of B-cell type in remission: Secondary | ICD-10-CM | POA: Diagnosis present

## 2021-08-09 DIAGNOSIS — N400 Enlarged prostate without lower urinary tract symptoms: Secondary | ICD-10-CM | POA: Diagnosis present

## 2021-08-09 DIAGNOSIS — F419 Anxiety disorder, unspecified: Secondary | ICD-10-CM | POA: Diagnosis present

## 2021-08-09 DIAGNOSIS — Z856 Personal history of leukemia: Secondary | ICD-10-CM

## 2021-08-09 DIAGNOSIS — E785 Hyperlipidemia, unspecified: Secondary | ICD-10-CM | POA: Diagnosis present

## 2021-08-09 DIAGNOSIS — Z20822 Contact with and (suspected) exposure to covid-19: Secondary | ICD-10-CM | POA: Diagnosis present

## 2021-08-09 DIAGNOSIS — Z8673 Personal history of transient ischemic attack (TIA), and cerebral infarction without residual deficits: Secondary | ICD-10-CM

## 2021-08-09 DIAGNOSIS — F01B18 Vascular dementia, moderate, with other behavioral disturbance: Secondary | ICD-10-CM | POA: Diagnosis present

## 2021-08-09 DIAGNOSIS — E876 Hypokalemia: Secondary | ICD-10-CM | POA: Diagnosis not present

## 2021-08-09 DIAGNOSIS — Y92009 Unspecified place in unspecified non-institutional (private) residence as the place of occurrence of the external cause: Secondary | ICD-10-CM

## 2021-08-09 DIAGNOSIS — Z9221 Personal history of antineoplastic chemotherapy: Secondary | ICD-10-CM

## 2021-08-09 DIAGNOSIS — G319 Degenerative disease of nervous system, unspecified: Secondary | ICD-10-CM | POA: Diagnosis not present

## 2021-08-09 DIAGNOSIS — J439 Emphysema, unspecified: Secondary | ICD-10-CM | POA: Diagnosis not present

## 2021-08-09 DIAGNOSIS — K8689 Other specified diseases of pancreas: Secondary | ICD-10-CM | POA: Diagnosis present

## 2021-08-09 DIAGNOSIS — M5030 Other cervical disc degeneration, unspecified cervical region: Secondary | ICD-10-CM | POA: Diagnosis not present

## 2021-08-09 DIAGNOSIS — M4712 Other spondylosis with myelopathy, cervical region: Secondary | ICD-10-CM | POA: Diagnosis present

## 2021-08-09 DIAGNOSIS — R059 Cough, unspecified: Secondary | ICD-10-CM

## 2021-08-09 DIAGNOSIS — E559 Vitamin D deficiency, unspecified: Secondary | ICD-10-CM | POA: Diagnosis present

## 2021-08-09 DIAGNOSIS — R7303 Prediabetes: Secondary | ICD-10-CM | POA: Diagnosis present

## 2021-08-09 DIAGNOSIS — I10 Essential (primary) hypertension: Secondary | ICD-10-CM | POA: Diagnosis present

## 2021-08-09 DIAGNOSIS — Z87891 Personal history of nicotine dependence: Secondary | ICD-10-CM

## 2021-08-09 DIAGNOSIS — E782 Mixed hyperlipidemia: Secondary | ICD-10-CM | POA: Diagnosis not present

## 2021-08-09 DIAGNOSIS — Z79899 Other long term (current) drug therapy: Secondary | ICD-10-CM

## 2021-08-09 DIAGNOSIS — D61818 Other pancytopenia: Secondary | ICD-10-CM | POA: Diagnosis present

## 2021-08-09 DIAGNOSIS — Z880 Allergy status to penicillin: Secondary | ICD-10-CM

## 2021-08-09 DIAGNOSIS — J101 Influenza due to other identified influenza virus with other respiratory manifestations: Secondary | ICD-10-CM | POA: Diagnosis present

## 2021-08-09 DIAGNOSIS — G4700 Insomnia, unspecified: Secondary | ICD-10-CM | POA: Diagnosis present

## 2021-08-09 DIAGNOSIS — Z823 Family history of stroke: Secondary | ICD-10-CM

## 2021-08-09 DIAGNOSIS — R627 Adult failure to thrive: Secondary | ICD-10-CM | POA: Diagnosis present

## 2021-08-09 DIAGNOSIS — Z043 Encounter for examination and observation following other accident: Secondary | ICD-10-CM | POA: Diagnosis not present

## 2021-08-09 DIAGNOSIS — Z833 Family history of diabetes mellitus: Secondary | ICD-10-CM

## 2021-08-09 LAB — DIFFERENTIAL
Abs Immature Granulocytes: 0.01 10*3/uL (ref 0.00–0.07)
Basophils Absolute: 0 10*3/uL (ref 0.0–0.1)
Basophils Relative: 1 %
Eosinophils Absolute: 0 10*3/uL (ref 0.0–0.5)
Eosinophils Relative: 0 %
Immature Granulocytes: 0 %
Lymphocytes Relative: 7 %
Lymphs Abs: 0.4 10*3/uL — ABNORMAL LOW (ref 0.7–4.0)
Monocytes Absolute: 0.5 10*3/uL (ref 0.1–1.0)
Monocytes Relative: 9 %
Neutro Abs: 4.9 10*3/uL (ref 1.7–7.7)
Neutrophils Relative %: 83 %
Smear Review: DECREASED

## 2021-08-09 LAB — CBC
HCT: 38.5 % — ABNORMAL LOW (ref 39.0–52.0)
Hemoglobin: 12.5 g/dL — ABNORMAL LOW (ref 13.0–17.0)
MCH: 30.3 pg (ref 26.0–34.0)
MCHC: 32.5 g/dL (ref 30.0–36.0)
MCV: 93.2 fL (ref 80.0–100.0)
Platelets: 112 K/uL — ABNORMAL LOW (ref 150–400)
RBC: 4.13 MIL/uL — ABNORMAL LOW (ref 4.22–5.81)
RDW: 13.8 % (ref 11.5–15.5)
WBC: 5.8 K/uL (ref 4.0–10.5)
nRBC: 0 % (ref 0.0–0.2)

## 2021-08-09 LAB — I-STAT CHEM 8, ED
BUN: 14 mg/dL (ref 8–23)
Calcium, Ion: 1.23 mmol/L (ref 1.15–1.40)
Chloride: 105 mmol/L (ref 98–111)
Creatinine, Ser: 1.2 mg/dL (ref 0.61–1.24)
Glucose, Bld: 81 mg/dL (ref 70–99)
HCT: 39 % (ref 39.0–52.0)
Hemoglobin: 13.3 g/dL (ref 13.0–17.0)
Potassium: 3.9 mmol/L (ref 3.5–5.1)
Sodium: 139 mmol/L (ref 135–145)
TCO2: 25 mmol/L (ref 22–32)

## 2021-08-09 LAB — COMPREHENSIVE METABOLIC PANEL
ALT: 11 U/L (ref 0–44)
AST: 21 U/L (ref 15–41)
Albumin: 3.7 g/dL (ref 3.5–5.0)
Alkaline Phosphatase: 49 U/L (ref 38–126)
Anion gap: 7 (ref 5–15)
BUN: 14 mg/dL (ref 8–23)
CO2: 24 mmol/L (ref 22–32)
Calcium: 9.4 mg/dL (ref 8.9–10.3)
Chloride: 106 mmol/L (ref 98–111)
Creatinine, Ser: 1.2 mg/dL (ref 0.61–1.24)
GFR, Estimated: >60 mL/min (ref >60)
Glucose, Bld: 81 mg/dL (ref 70–99)
Potassium: 4.5 mmol/L (ref 3.5–5.1)
Sodium: 137 mmol/L (ref 135–145)
Total Bilirubin: 1.5 mg/dL — ABNORMAL HIGH (ref 0.3–1.2)
Total Protein: 6.1 g/dL — ABNORMAL LOW (ref 6.5–8.1)

## 2021-08-09 LAB — RESP PANEL BY RT-PCR (FLU A&B, COVID) ARPGX2
Influenza A by PCR: POSITIVE — AB
Influenza B by PCR: NEGATIVE
SARS Coronavirus 2 by RT PCR: NEGATIVE

## 2021-08-09 LAB — ETHANOL: Alcohol, Ethyl (B): <10 mg/dL (ref <10)

## 2021-08-09 LAB — PROTIME-INR
INR: 1.1 (ref 0.8–1.2)
Prothrombin Time: 14.5 s (ref 11.4–15.2)

## 2021-08-09 LAB — CK: Total CK: 123 U/L (ref 49–397)

## 2021-08-09 LAB — APTT: aPTT: 25 s (ref 24–36)

## 2021-08-09 MED ORDER — ACETAMINOPHEN 160 MG/5ML PO SOLN: 650.0000 mg | ORAL | Status: DC | PRN

## 2021-08-09 MED ORDER — OSELTAMIVIR PHOSPHATE 75 MG PO CAPS
75.0000 mg | ORAL_CAPSULE | Freq: Two times a day (BID) | ORAL | Status: AC
  Administered 2021-08-09 – 2021-08-13 (×10): 75 mg via ORAL
  Filled 2021-08-09 (×10): qty 1

## 2021-08-09 MED ORDER — PANTOPRAZOLE SODIUM 40 MG PO TBEC
40.0000 mg | DELAYED_RELEASE_TABLET | Freq: Every day | ORAL | Status: DC
  Administered 2021-08-10 – 2021-08-20 (×11): 40 mg via ORAL
  Filled 2021-08-09 (×11): qty 1

## 2021-08-09 MED ORDER — TAMSULOSIN HCL 0.4 MG PO CAPS
0.4000 mg | ORAL_CAPSULE | Freq: Every day | ORAL | Status: DC
  Administered 2021-08-10 – 2021-08-19 (×10): 0.4 mg via ORAL
  Filled 2021-08-09 (×11): qty 1

## 2021-08-09 MED ORDER — CLOPIDOGREL BISULFATE 75 MG PO TABS
75.0000 mg | ORAL_TABLET | Freq: Every day | ORAL | Status: DC
  Administered 2021-08-10 – 2021-08-20 (×11): 75 mg via ORAL
  Filled 2021-08-09 (×11): qty 1

## 2021-08-09 MED ORDER — GUAIFENESIN ER 600 MG PO TB12
600.0000 mg | ORAL_TABLET | Freq: Two times a day (BID) | ORAL | Status: DC
  Administered 2021-08-09 – 2021-08-20 (×23): 600 mg via ORAL
  Filled 2021-08-09 (×25): qty 1

## 2021-08-09 MED ORDER — SODIUM CHLORIDE 0.9 % IV SOLN: Freq: Once | INTRAVENOUS | Status: AC

## 2021-08-09 MED ORDER — BUSPIRONE HCL 10 MG PO TABS
5.0000 mg | ORAL_TABLET | Freq: Three times a day (TID) | ORAL | Status: DC
  Administered 2021-08-09 – 2021-08-20 (×32): 5 mg via ORAL
  Filled 2021-08-09 (×32): qty 1

## 2021-08-09 MED ORDER — ASPIRIN 325 MG PO TABS
325.0000 mg | ORAL_TABLET | Freq: Once | ORAL | Status: AC
  Administered 2021-08-09: 325 mg via ORAL
  Filled 2021-08-09: qty 1

## 2021-08-09 MED ORDER — SERTRALINE HCL 50 MG PO TABS
50.0000 mg | ORAL_TABLET | Freq: Every day | ORAL | Status: DC
  Administered 2021-08-10 – 2021-08-20 (×11): 50 mg via ORAL
  Filled 2021-08-09 (×11): qty 1

## 2021-08-09 MED ORDER — STROKE: EARLY STAGES OF RECOVERY BOOK
Freq: Once | Status: AC
  Filled 2021-08-09: qty 1

## 2021-08-09 MED ORDER — CLOPIDOGREL BISULFATE 75 MG PO TABS
300.0000 mg | ORAL_TABLET | Freq: Once | ORAL | Status: AC
  Administered 2021-08-09: 300 mg via ORAL
  Filled 2021-08-09: qty 4

## 2021-08-09 MED ORDER — ACETAMINOPHEN 650 MG RE SUPP: 650.0000 mg | RECTAL | Status: DC | PRN

## 2021-08-09 MED ORDER — HYDROMORPHONE HCL 1 MG/ML IJ SOLN
1.0000 mg | Freq: Once | INTRAMUSCULAR | Status: AC
  Administered 2021-08-09: 1 mg via INTRAVENOUS
  Filled 2021-08-09: qty 1

## 2021-08-09 MED ORDER — ROSUVASTATIN CALCIUM 5 MG PO TABS
10.0000 mg | ORAL_TABLET | Freq: Every day | ORAL | Status: DC
  Administered 2021-08-09 – 2021-08-10 (×2): 10 mg via ORAL
  Filled 2021-08-09 (×2): qty 2

## 2021-08-09 MED ORDER — PANCRELIPASE (LIP-PROT-AMYL) 12000-38000 UNITS PO CPEP
12000.0000 [IU] | ORAL_CAPSULE | Freq: Three times a day (TID) | ORAL | Status: DC
  Administered 2021-08-10 – 2021-08-20 (×30): 12000 [IU] via ORAL
  Filled 2021-08-09 (×33): qty 1

## 2021-08-09 MED ORDER — ACETAMINOPHEN 325 MG PO TABS
650.0000 mg | ORAL_TABLET | ORAL | Status: DC | PRN
  Administered 2021-08-10 – 2021-08-19 (×5): 650 mg via ORAL
  Filled 2021-08-09 (×6): qty 2

## 2021-08-09 MED ORDER — ENOXAPARIN SODIUM 40 MG/0.4ML IJ SOSY
40.0000 mg | PREFILLED_SYRINGE | Freq: Every day | INTRAMUSCULAR | Status: DC
  Administered 2021-08-09 – 2021-08-20 (×12): 40 mg via SUBCUTANEOUS
  Filled 2021-08-09 (×12): qty 0.4

## 2021-08-09 MED ORDER — ASPIRIN EC 81 MG PO TBEC
81.0000 mg | DELAYED_RELEASE_TABLET | Freq: Every day | ORAL | Status: DC
  Administered 2021-08-10 – 2021-08-20 (×11): 81 mg via ORAL
  Filled 2021-08-09 (×11): qty 1

## 2021-08-09 MED ORDER — ALPRAZOLAM 0.5 MG PO TABS
0.5000 mg | ORAL_TABLET | Freq: Every day | ORAL | Status: DC | PRN
  Administered 2021-08-11 – 2021-08-20 (×8): 0.5 mg via ORAL
  Filled 2021-08-09 (×8): qty 1

## 2021-08-09 NOTE — Evaluation (Addendum)
Physical Therapy Evaluation Patient Details Name: Javier [name] MRN: [id number] DOB: 05-15-1941 Today's Date: 08/09/2021  History of Present Illness  Pt is an 80 y.o. male who presented 08/09/21 s/p fall with reports of L sided weakness. CT and MRI of head negative for acute intracranial processes. All other imaging also negative 11/11. Pt positive for influenza. PMHx of "pre" DM II, HLD, HTN, CLL, vascular dementia, remote lacunar stroke, BPH, gastric AVM with weight loss, remote tobacco abuse, anxiety, and Vit D deficiency   Clinical Impression  Pt presents with condition above and deficits mentioned below, see PT Problem List. PTA, he was mobilizing with a RW, reporting he has been needing increased assistance physically from his wife lately. Pt reports he lives with his wife in a 1-level house with a level entry. However, pt is poor historian due to cognitive deficits at baseline, and no family present to confirm info. Currently, pt does not display significant if any unilateral weakness or changes in sensation, but does display significant deficits in gross strength, balance, awareness, problem-solving, sequencing, and activity tolerance. He is at high risk for falls, demonstrating a strong posterior lean despite cues to correct. Pt required maxA to transfer to stand and take a few lateral steps at EOB with a RW this date. He is very motivated to improve and return home. Considering his motivation, good tolerance to therapy, and significant change in functional status, he may benefit from intensive therapy in the inpatient rehab setting if he were to qualify. If not inpt rehab, then SNF. Will continue to follow acutely.     Recommendations for follow up therapy are one component of a multi-disciplinary discharge planning process, led by the attending physician.  Recommendations may be updated based on patient status, additional functional criteria and insurance authorization.  Follow Up  Recommendations Acute inpatient rehab (3hours/day) (SNF if does not qualify for inpt rehab)    Assistance Recommended at Discharge Frequent or constant Supervision/Assistance  Functional Status Assessment Patient has had a recent decline in their functional status and demonstrates the ability to make significant improvements in function in a reasonable and predictable amount of time.  Equipment Recommendations  BSC/3in1;Other (comment);Hospital bed (tub bench)    Recommendations for Other Services Rehab consult     Precautions / Restrictions Precautions Precautions: Fall Restrictions Weight Bearing Restrictions: No      Mobility  Bed Mobility Overal bed mobility: Needs Assistance Bed Mobility: Supine to Sit;Sit to Supine     Supine to sit: Mod assist;HOB elevated Sit to supine: Mod assist;HOB elevated   General bed mobility comments: Pt requiring increased period of time to manage legs off EOB, cuing pt to pull on bed rail to ascend trunk, modA to scoot hips to EOB as pt continued to fall posteriorly when attempting. ModA to manage legs back to supine.    Transfers Overall transfer level: Needs assistance Equipment used: Rolling walker (2 wheels) Transfers: Sit to/from Stand Sit to Stand: Max assist           General transfer comment: Pt with strong posterior lean and difficulty extending knees, maxA to pull pt anteriorly and up to stand.    Ambulation/Gait Ambulation/Gait assistance: Max assist Gait Distance (Feet): 1 Feet Assistive device: Rolling walker (2 wheels) Gait Pattern/deviations: Decreased step length - left;Decreased stride length;Knees buckling;Shuffle;Leaning posteriorly;Decreased weight shift to right Gait velocity: reduced Gait velocity interpretation: <1.31 ft/sec, indicative of household ambulator   General Gait Details: Pt taking a few small, shuffling lateral steps  at EOB. Despite max cues to lean anteriorly, pt pushing posteriorly. Pt with poor  sequencing to shift weight, especially when stepping to L with L foot, needing cues to assist. Knee instability noted.  Stairs            Wheelchair Mobility    Modified Rankin (Stroke Patients Only) Modified Rankin (Stroke Patients Only) Pre-Morbid Rankin Score: Moderate disability Modified Rankin: Moderately severe disability     Balance Overall balance assessment: Needs assistance;History of Falls Sitting-balance support: Bilateral upper extremity supported;Feet supported Sitting balance-Leahy Scale: Poor Sitting balance - Comments: Pt with posterior lean despite max repeated cues to correct, when pulled to sit anteriorly pt reporting L hip pain, min guard-modA. Postural control: Posterior lean Standing balance support: Reliant on assistive device for balance Standing balance-Leahy Scale: Zero Standing balance comment: MaxA and bil UE on RW with pt displaying posterior lean despite cues to correct.                             Pertinent Vitals/Pain Pain Assessment: Faces Faces Pain Scale: Hurts little more Pain Location: L hip, L knee, arms Pain Descriptors / Indicators: Discomfort;Grimacing;Guarding Pain Intervention(s): Limited activity within patient's tolerance;Monitored during session;Repositioned    Home Living Family/patient expects to be discharged to:: Private residence Living Arrangements: Spouse/significant other Available Help at Discharge: Family;Available 24 hours/day Type of Home: House Home Access: Level entry       Home Layout: One level Home Equipment: Conservation officer, nature (2 wheels);Rollator (4 wheels);Shower seat      Prior Function Prior Level of Function : Needs assist;Patient poor historian/Family not available  Cognitive Assist : ADLs (cognitive)     Physical Assist : Mobility (physical) Mobility (physical): Transfers   Mobility Comments: Pt reports his wife has been having to assist him with mobility more recently, but usually  uses a RW for mobility.       Hand Dominance        Extremity/Trunk Assessment   Upper Extremity Assessment Upper Extremity Assessment: Defer to OT evaluation    Lower Extremity Assessment Lower Extremity Assessment: Generalized weakness (noted more functionally, MMT scores of 4 to 4+ bil; denied numbness/tingling bil)    Cervical / Trunk Assessment Cervical / Trunk Assessment: Kyphotic  Communication   Communication: No difficulties  Cognition Arousal/Alertness: Awake/alert Behavior During Therapy: Anxious Overall Cognitive Status: History of cognitive impairments - at baseline                                 General Comments: Pt with hx of dementia. Pt with poor awareness into his deficits and problem-solving how to correct them even when cued. Poor sequencing, requiring step-by-step multi-modal cues. Pt likely with anxiety in regards to fear of falling due to presentation with strong posterior lean.        General Comments      Exercises     Assessment/Plan    PT Assessment Patient needs continued PT services  PT Problem List Decreased strength;Decreased range of motion;Decreased activity tolerance;Decreased balance;Decreased coordination;Decreased mobility;Decreased cognition;Decreased safety awareness       PT Treatment Interventions DME instruction;Gait training;Functional mobility training;Therapeutic activities;Therapeutic exercise;Balance training;Neuromuscular re-education;Cognitive remediation;Patient/family education    PT Goals (Current goals can be found in the Care Plan section)  Acute Rehab PT Goals Patient Stated Goal: to go home PT Goal Formulation: With patient Time For Goal Achievement: 08/23/21  Potential to Achieve Goals: Fair    Frequency Min 3X/week   Barriers to discharge Other (comment) unsure of level of assistance wife is able to provide    Co-evaluation               AM-PAC PT "6 Clicks" Mobility  Outcome  Measure Help needed turning from your back to your side while in a flat bed without using bedrails?: A Lot Help needed moving from lying on your back to sitting on the side of a flat bed without using bedrails?: A Lot Help needed moving to and from a bed to a chair (including a wheelchair)?: A Lot Help needed standing up from a chair using your arms (e.g., wheelchair or bedside chair)?: A Lot Help needed to walk in hospital room?: Total Help needed climbing 3-5 steps with a railing? : Total 6 Click Score: 10    End of Session Equipment Utilized During Treatment: Gait belt Activity Tolerance: Patient tolerated treatment well;Patient limited by pain Patient left: in bed;with call bell/phone within reach;with bed alarm set Nurse Communication: Mobility status;Need for lift equipment PT Visit Diagnosis: Unsteadiness on feet (R26.81);Other abnormalities of gait and mobility (R26.89);Muscle weakness (generalized) (M62.81);History of falling (Z91.81);Difficulty in walking, not elsewhere classified (R26.2)    Time: 0071-2197 PT Time Calculation (min) (ACUTE ONLY): 20 min   Charges:   PT Evaluation $PT Eval Moderate Complexity: 1 Mod          Moishe Spice, PT, DPT Acute Rehabilitation Services  Pager: [phone] Office: [phone]   Orvan Falconer 08/09/2021, 6:42 PM

## 2021-08-09 NOTE — ED Notes (Signed)
Patient transported to X-ray 

## 2021-08-09 NOTE — ED Notes (Signed)
Patient transported to MRI 

## 2021-08-09 NOTE — Progress Notes (Signed)
Pt arrived on unit. Pt oriented to unit with bed locked and at the lowest position

## 2021-08-09 NOTE — ED Provider Notes (Signed)
[facility] EMERGENCY DEPARTMENT Provider Note   CSN: [id number] Arrival date & time: 08/09/21  0759     History Chief Complaint  Patient presents with   Fall   Weakness    Javier [name] is a 80 y.o. male.  Javier [name] is a 80 y.o. male with a history of hypertension, hyperlipidemia, dementia, CLL, BPH, GERD, who presents to the emergency department with his wife for evaluation of left-sided weakness and fall.  Wife assists in providing history as patient does have some degree of underlying dementia.  She reports this morning he went to get up from bed and needed more assistance than usual walking to the bathroom and then had a fall.  He was reporting that he was feeling weak only on his left side this morning, but now reports that this is improved somewhat.  Wife did not notice any changes in his speech or dysarthria and patient denies any visual changes.  He denies having associated pain on the left side until after the fall in which she did have some mild left shoulder pain.  Wife reports that over the past several months he has had some chronic worsening generalized weakness but this morning was acutely different and that he complained of the weakness only on his left side.  Last known well time was 8:30 PM last night when patient went to bed.  Patient reports yesterday he was acting at baseline and able to get himself to bed without difficulty.  The history is provided by the patient, medical records and the spouse.      Past Medical History:  Diagnosis Date   Diverticulosis    Hyperlipidemia    Hypertension    Memory loss    Prediabetes    Vitamin D deficiency     Patient Active Problem List   Diagnosis Date Noted   Left-sided weakness 08/09/2021   At high risk for falls 04/29/2021   Memory loss 03/28/2021   Sleeping difficulty 03/28/2021   Agitation 03/28/2021   History of lacunar cerebrovascular accident 01/09/2021   Cerebral microvascular  disease 01/09/2021   BPH with obstruction/lower urinary tract symptoms 01/09/2021   First degree AV block 01/09/2021   Gait abnormality 12/20/2020   Confusion 12/20/2020   BMI 26.0-26.9,adult 03/06/2020   Gastric AVM 03/06/2020   History of colon polyps 03/06/2020   Aortic atherosclerosis ([place]) - per CT 04/2016 08/23/2019   Memory changes 05/25/2019   Counseling regarding advance care planning and goals of care 01/24/2019   Anxiety 02/08/2018   CLL (chronic lymphocytic leukemia) ([place]) 11/01/2016   Encounter for Medicare annual wellness exam 09/11/2015   GERD  02/09/2015   Medication management 11/02/2013   Hyperlipidemia    Hypertension    Abnormal glucose    Vitamin D deficiency    Diverticulosis     Past Surgical History:  Procedure Laterality Date   NO PAST SURGERIES         Family History  Problem Relation Age of Onset   Stroke Mother    Early death Father        Farm/tractor accident   Diabetes Brother    Cirrhosis Brother    Colon cancer Paternal Grandfather     Social History   Tobacco Use   Smoking status: Former    Types: Cigarettes    Quit date: 01/17/2003    Years since quitting: 18.5   Smokeless tobacco: Never  Vaping Use   Vaping Use: Never used  Substance Use Topics   Alcohol use: Not Currently    Comment: very rare   Drug use: No    Home Medications Prior to Admission medications   Medication Sig Start Date End Date Taking? Authorizing Provider  ALPRAZolam (XANAX) 0.5 MG tablet Take 1/2 - 1 tablet 2 x /day ONLY if needed for Anxiety Attack &  limit to 5 days /week to avoid Addiction & Dementia / - Recommend use sparingly - NOT ROUTINE !  - Rx to last 1 month 07/15/21   Unk Pinto, MD  Alum & Mag Hydroxide-Simeth (MYLANTA PO) Take by mouth as needed.    [provider]  aspirin EC 81 MG tablet Take 81 mg by mouth daily.    [provider]  busPIRone (BUSPAR) 5 MG tablet Take 1 tablet (5 mg total) by mouth 3 (three)  times daily. 05/21/21 08/19/21  Magda Bernheim, NP  Cholecalciferol (VITAMIN D3) 5000 units TABS Take 5,000 Units by mouth daily.    [provider]  losartan-hydrochlorothiazide (HYZAAR) 100-25 MG tablet Take  1 tablet  Daily  for BP & Fluid Retention /Ankle Swelling 12/03/20   Unk Pinto, MD  pravastatin (PRAVACHOL) 40 MG tablet TAKE 1 TABLET AT BEDTIME FOR CHOLESTEROL. 01/09/21   Liane Comber, NP  rosuvastatin (CRESTOR) 10 MG tablet Take 1 tablet Daily for Cholesterol 05/07/21   Unk Pinto, MD  sertraline (ZOLOFT) 50 MG tablet Take 1 tablet (50 mg total) by mouth daily. 05/21/21 08/19/21  Magda Bernheim, NP  tamsulosin (FLOMAX) 0.4 MG CAPS capsule Take 1 capsule (0.4 mg total) by mouth daily after supper. For prostate. 01/09/21   Liane Comber, NP  triamcinolone (NASACORT) 55 MCG/ACT AERO nasal inhaler Place 2 sprays into the nose at bedtime. Patient taking differently: Place 2 sprays into the nose as needed. 01/21/19 05/11/20  Vladimir Crofts, PA-C    Allergies    Penicillins and Ppd [tuberculin purified protein derivative]  Review of Systems   Review of Systems  Constitutional:  Negative for chills and fever.  HENT: Negative.    Respiratory:  Negative for cough and shortness of breath.   Cardiovascular:  Negative for chest pain.  Gastrointestinal:  Negative for abdominal pain, nausea and vomiting.  Genitourinary:  Negative for dysuria and frequency.  Musculoskeletal:  Positive for arthralgias. Negative for back pain and myalgias.  Skin:  Negative for color change and rash.  Neurological:  Positive for weakness. Negative for dizziness, seizures, syncope, facial asymmetry, speech difficulty, light-headedness, numbness and headaches.  All other systems reviewed and are negative.  Physical Exam Updated Vital Signs BP 136/64   Pulse 80   Temp 98.4 F (36.9 C) (Oral)   Resp 19   SpO2 95%   Physical Exam Vitals and nursing note reviewed.  Constitutional:       General: He is not in acute distress.    Appearance: Normal appearance. He is well-developed. He is not ill-appearing or diaphoretic.  HENT:     Head: Normocephalic and atraumatic.     Mouth/Throat:     Mouth: Mucous membranes are moist.     Pharynx: Oropharynx is clear.  Eyes:     General:        Right eye: No discharge.        Left eye: No discharge.     Extraocular Movements: Extraocular movements intact.     Pupils: Pupils are equal, round, and reactive to light.  Cardiovascular:     Rate and Rhythm: Normal  rate and regular rhythm.     Pulses: Normal pulses.     Heart sounds: Normal heart sounds. No murmur heard.   No friction rub. No gallop.  Pulmonary:     Effort: Pulmonary effort is normal. No respiratory distress.     Breath sounds: Normal breath sounds. No wheezing or rales.     Comments: Respirations equal and unlabored, patient able to speak in full sentences, lungs clear to auscultation bilaterally  Abdominal:     General: Bowel sounds are normal. There is no distension.     Palpations: Abdomen is soft. There is no mass.     Tenderness: There is no abdominal tenderness. There is no guarding.     Comments: Abdomen soft, nondistended, nontender to palpation in all quadrants without guarding or peritoneal signs  Musculoskeletal:        General: No deformity.     Cervical back: Neck supple.     Right lower leg: No edema.     Left lower leg: No edema.  Skin:    General: Skin is warm and dry.     Capillary Refill: Capillary refill takes less than 2 seconds.  Neurological:     Mental Status: He is alert and oriented to person, place, and time.     Coordination: Coordination normal.     Comments: Speech is clear, able to follow commands CN III-XII intact Normal strength in upper and lower extremities bilaterally including dorsiflexion and plantar flexion, strong and equal grip strength Sensation normal to light and sharp touch Moves extremities without ataxia,  coordination intact  Psychiatric:        Mood and Affect: Mood normal.        Behavior: Behavior normal.    ED Results / Procedures / Treatments   Labs (all labs ordered are listed, but only abnormal results are displayed) Labs Reviewed  RESP PANEL BY RT-PCR (FLU A&B, COVID) ARPGX2 - Abnormal; Notable for the following components:      Result Value   Influenza A by PCR POSITIVE (*)    All other components within normal limits  CBC - Abnormal; Notable for the following components:   RBC 4.13 (*)    Hemoglobin 12.5 (*)    HCT 38.5 (*)    Platelets 112 (*)    All other components within normal limits  DIFFERENTIAL - Abnormal; Notable for the following components:   Lymphs Abs 0.4 (*)    All other components within normal limits  COMPREHENSIVE METABOLIC PANEL - Abnormal; Notable for the following components:   Total Protein 6.1 (*)    Total Bilirubin 1.5 (*)    All other components within normal limits  ETHANOL  PROTIME-INR  APTT  RAPID URINE DRUG SCREEN, HOSP PERFORMED  URINALYSIS, ROUTINE W REFLEX MICROSCOPIC  I-STAT CHEM 8, ED    EKG None  Radiology DG Chest 2 View  Result Date: 08/09/2021 CLINICAL DATA:  Status post fall.  Left shoulder pain. EXAM: CHEST - 2 VIEW COMPARISON:  None. FINDINGS: The heart size and mediastinal contours are within normal limits. Both lungs are clear. The visualized skeletal structures are unremarkable. IMPRESSION: No active cardiopulmonary disease. Electronically Signed   By: Kathreen Devoid M.D.   On: 08/09/2021 08:58   CT HEAD WO CONTRAST  Result Date: 08/09/2021 CLINICAL DATA:  [place] out of bed this morning, LEFT side weakness EXAM: CT HEAD WITHOUT CONTRAST CT CERVICAL SPINE WITHOUT CONTRAST TECHNIQUE: Multidetector CT imaging of the head and cervical spine was performed  following the standard protocol without intravenous contrast. Multiplanar CT image reconstructions of the cervical spine were also generated. COMPARISON:  CT head 04/20/2021  FINDINGS: CT HEAD FINDINGS Brain: Generalized atrophy. Normal ventricular morphology. No midline shift or mass effect. Mild small vessel chronic ischemic changes of deep cerebral white matter. No intracranial hemorrhage, mass lesion, or evidence of acute infarction. No extra-axial fluid collections. Vascular: No hyperdense vessels. Skull: Intact Sinuses/Orbits: Clear Other: N/A CT CERVICAL SPINE FINDINGS Alignment: Minimal retrolisthesis at C3-C4 and C4-C5 likely degenerative. Remaining alignments normal. Skull base and vertebrae: Osseous mineralization normal. Multilevel disc space narrowing and endplate spur formation. Multilevel facet degenerative changes, mild. No fracture or bone destruction. No additional subluxation. Uncovertebral spurs encroach upon cervical neural foramina bilaterally at multiple levels. Soft tissues and spinal canal: Prevertebral soft tissues normal thickness. Remaining visualized cervical soft tissues unremarkable Disc levels:  No significant abnormalities. Upper chest: Lung apices emphysematous but clear Other: N/A IMPRESSION: Atrophy with small vessel chronic ischemic changes of deep cerebral white matter. No acute intracranial abnormalities. Multilevel degenerative disc and facet disease changes of the cervical spine as above. No acute cervical spine abnormalities. Electronically Signed   By: Lavonia Dana M.D.   On: 08/09/2021 10:30   CT Cervical Spine Wo Contrast  Result Date: 08/09/2021 CLINICAL DATA:  [place] out of bed this morning, LEFT side weakness EXAM: CT HEAD WITHOUT CONTRAST CT CERVICAL SPINE WITHOUT CONTRAST TECHNIQUE: Multidetector CT imaging of the head and cervical spine was performed following the standard protocol without intravenous contrast. Multiplanar CT image reconstructions of the cervical spine were also generated. COMPARISON:  CT head 04/20/2021 FINDINGS: CT HEAD FINDINGS Brain: Generalized atrophy. Normal ventricular morphology. No midline shift or mass  effect. Mild small vessel chronic ischemic changes of deep cerebral white matter. No intracranial hemorrhage, mass lesion, or evidence of acute infarction. No extra-axial fluid collections. Vascular: No hyperdense vessels. Skull: Intact Sinuses/Orbits: Clear Other: N/A CT CERVICAL SPINE FINDINGS Alignment: Minimal retrolisthesis at C3-C4 and C4-C5 likely degenerative. Remaining alignments normal. Skull base and vertebrae: Osseous mineralization normal. Multilevel disc space narrowing and endplate spur formation. Multilevel facet degenerative changes, mild. No fracture or bone destruction. No additional subluxation. Uncovertebral spurs encroach upon cervical neural foramina bilaterally at multiple levels. Soft tissues and spinal canal: Prevertebral soft tissues normal thickness. Remaining visualized cervical soft tissues unremarkable Disc levels:  No significant abnormalities. Upper chest: Lung apices emphysematous but clear Other: N/A IMPRESSION: Atrophy with small vessel chronic ischemic changes of deep cerebral white matter. No acute intracranial abnormalities. Multilevel degenerative disc and facet disease changes of the cervical spine as above. No acute cervical spine abnormalities. Electronically Signed   By: Lavonia Dana M.D.   On: 08/09/2021 10:30   DG Shoulder Left  Result Date: 08/09/2021 CLINICAL DATA:  Status post fall.  Left shoulder pain. EXAM: LEFT SHOULDER - 2+ VIEW COMPARISON:  None. FINDINGS: No fracture or dislocation. Tiny inferior humeral marginal osteophyte. Mild degenerative changes of the acromioclavicular joint. Soft tissues are normal. IMPRESSION: No acute osseous injury of the left shoulder. Electronically Signed   By: Kathreen Devoid M.D.   On: 08/09/2021 08:59    Procedures Procedures   Medications Ordered in ED Medications   stroke: mapping our early stages of recovery book (has no administration in time range)  0.9 %  sodium chloride infusion (has no administration in time  range)  acetaminophen (TYLENOL) tablet 650 mg (has no administration in time range)    Or  acetaminophen (TYLENOL) 160 MG/5ML  solution 650 mg (has no administration in time range)    Or  acetaminophen (TYLENOL) suppository 650 mg (has no administration in time range)  enoxaparin (LOVENOX) injection 40 mg (has no administration in time range)    ED Course  I have reviewed the triage vital signs and the nursing notes.  Pertinent labs & imaging results that were available during my care of the patient were reviewed by me and considered in my medical decision making (see chart for details).    MDM Rules/Calculators/A&P                           80 year old male presents to the ED reporting left-sided weakness when he woke up this morning causing a fall.  He has had some progressive generalized weakness over the past several months but this focal left-sided weakness was new, last known well time of 830 last night when he went to bed.  He reports now his weakness seems to be improving.  Wife estimates symptoms lasted maybe 30 minutes.  Complaining of some left shoulder pain from the fall but no other complaints or injuries after the fall.  Patient is not on anticoagulation.  Currently he does not exhibit any neurologic deficits.  Concern for potential small stroke versus TIA.  We will also look for sources of infection that could lead to generalized weakness or metabolic derangements.  I have independently ordered, reviewed and interpreted all labs and imaging: CBC: No leukocytosis, hemoglobin is stable CMP: No significant electrolyte derangements, normal renal function, mildly elevated T bili but LFTs otherwise unremarkable Coags: WNL Ethanol: WNL UA: Pending collection  Incidentally patient is found to be positive for influenza A but is not experiencing fevers, URI or flulike symptoms at this time.  Chest x-ray without active cardiopulmonary disease and x-ray of the left shoulder with no  acute bony abnormalities  CTs of the head and cervical spine with no acute traumatic injury, atrophy and small vessel chronic ischemic changes noted.  Will proceed with MRI, high clinical suspicion for TIA.  Case discussed with Dr. Curly Shores with neurology.  Given patient's ABCD 2 score of 4 and history of CLL putting him at increased risk for TIA and stroke recommends medicine admission for TIA work-up.  MRI is pending.  Neuro will see patient in follow-up.  Case discussed with Dr. Tamala Julian with [facility] hospitalist who will see and admit the patient.  I discussed plan with patient and his wife at bedside and they expressed understanding and agreement.  Final Clinical Impression(s) / ED Diagnoses Final diagnoses:  Left-sided weakness  TIA (transient ischemic attack)    Rx / DC Orders ED Discharge Orders     None        Janet Berlin 08/09/21 1354    Luna Fuse, MD 08/11/21 1313

## 2021-08-09 NOTE — ED Triage Notes (Signed)
Pt dropped off by wife who reports pt fell out of bed this morning. Pt sts he tried to get out of the bed and his L side was so weak that he fell and had to be helped up by wife. Denies LOC or hitting head. LSN 2030 last night.

## 2021-08-09 NOTE — Consult Note (Addendum)
Neurology Consultation  Reason for Consult: ? TIA.  Referring Physician: Harvest Forest, MD.   CC: left sided weakness with fall. ? TIA.   History is obtained from: chart.  HPI: Javier [name] is a 80 y.o. male with a PMHx of "pre" DM II, HLD, HTN, CLL, vascular dementia, remote left cerebellar lacunar infarct, BPH, gastric AVM with weight loss, remote tobacco abuse, anxiety, and Vit D deficiency. Patient was brought in by wife for a fall this am secondary to leg "not working". Wife stated over past few months, patient c/o generalized weakness, but today was acutely different and weak on the left. He was in his usual state of health yesterday before bed at 2030 hours 08/08/21.   No code stroke was called due to [place] hours 08/08/21 and there were no noted focal stroke symptoms on arrival to ED.   In review of chart, Patient was followed for vascular dementia by Dr. Krista Blue at [facility]. MRI b in 4/22 showed mild atrophy, mild supratentorial small vessel disease, and left cerebellar lacunar infarct. Chemotherapy for CLL finished in 2021. There is documented generalized weakness for 8 months or so.   Patient has been seen in past for falls and weakness in ED. In past, he hit his head, but CT negative for acute finding.   Due to left sided weakness, neurology was asked to consult.   ROS: A robust ROS was performed and is negative except as noted in the HPI. No HA, n/v, pain, or SOB. + generalized weakness, but denies weakness in a particular limb. Noted to have what seem like intentional tremors which he states are worse with reaching for objects.    Past Medical History:  Diagnosis Date   Diverticulosis    Hyperlipidemia    Hypertension    Memory loss    Prediabetes    Vitamin D deficiency   Hx left cerebellar lacunar infarct. Anxiety. Insomnia.  BPH.  Gastric AVM with weight loss.   Family History  Problem Relation Age of Onset   Stroke Mother    Early death Father        Farm/tractor  accident   Diabetes Brother    Cirrhosis Brother    Colon cancer Paternal Grandfather    Social History:   reports that he quit smoking about 18 years ago. His smoking use included cigarettes. He has never used smokeless tobacco. He reports that he does not currently use alcohol. He reports that he does not use drugs.  Medications  Current Facility-Administered Medications:     stroke: mapping our early stages of recovery book, , Does not apply, Once, Tamala Julian, Rondell A, MD   acetaminophen (TYLENOL) tablet 650 mg, 650 mg, Oral, Q4H PRN **OR** acetaminophen (TYLENOL) 160 MG/5ML solution 650 mg, 650 mg, Per Tube, Q4H PRN **OR** acetaminophen (TYLENOL) suppository 650 mg, 650 mg, Rectal, Q4H PRN, Smith, Rondell A, MD   enoxaparin (LOVENOX) injection 40 mg, 40 mg, Subcutaneous, Daily, Smith, Rondell A, MD, 40 mg at 08/09/21 1401   guaiFENesin (MUCINEX) 12 hr tablet 600 mg, 600 mg, Oral, BID, Smith, Rondell A, MD, 600 mg at 08/09/21 1503   oseltamivir (TAMIFLU) capsule 75 mg, 75 mg, Oral, BID, Smith, Rondell A, MD, 75 mg at 08/09/21 1405  Exam: Current vital signs: BP 128/62 (BP Location: Right Arm)   Pulse 70   Temp (!) 101 F (38.3 C) (Oral)   Resp 20   SpO2 99%  Vital signs in last 24 hours: Temp:  [98.4  F (36.9 C)-101 F (38.3 C)] 101 F (38.3 C) (11/11 1600) Pulse Rate:  [36-80] 70 (11/11 1600) Resp:  [16-35] 20 (11/11 1600) BP: (124-143)/(61-80) 128/62 (11/11 1600) SpO2:  [95 %-100 %] 99 % (11/11 1600)  PE: GENERAL: Slender male, appears chronically ill and somewhat frail. Awake, alert in NAD.  HEENT: normocephalic and atraumatic. LUNGS - Normal respiratory effort.  CV - RRR on tele. ABDOMEN - Soft, nontender. Ext: warm, well perfused. Psych: affect light. Calm, cooperative.   NEURO:  Mental Status: Alert and oriented to self, place, city, state, month, next holiday. Not oriented to day, date, or year. Does not know his age-responds 60 something.  Speech/Language: speech  is without dysarthria or aphasia.  Naming and purpose of objects intact. Repetition, fluency, and comprehension intact.  Cranial Nerves:  II: PERRL 2 mm/brisk. visual fields full.  III, IV, VI: EOMI. Lid elevation symmetric and full.  V: sensation is intact and symmetrical to face. VII: Smile is symmetrical. Able to raise eyebrows symmetrically.  VIII:hearing intact to voice. IX, X: palate elevation is symmetric. Phonation normal.  XI: normal sternocleidomastoid and trapezius muscle strength. LGX:QJJHER is symmetrical without fasciculations.   Motor:  5/5 throughout.  Tone is normal. Bulk is normal.  Sensation- Intact to light touch bilaterally in all four extremities. Extinction absent to DSS.  Coordination: FTN intact bilaterally. Fine tremors noted when reaching and with holding arms up. HKS ataxic LLE. No pronator drift.  DTRs:  BUE/BLE: 2+ brachioradialis. 2+ patella.  Gait- deferred.  NIHSS:  1a Level of Consciousness: 0 1b LOC Questions: 0 1c LOC Commands: 0 2 Best Gaze: 0 3 Visual: 0 4 Facial Palsy: 0 5a Motor Arm - left: 0 5b Motor Arm - Right: 0 6a Motor Leg - Left: 0 6b Motor Leg - Right: 0 7 Limb Ataxia: 1 8 Sensory: 0 9 Best Language: 0 10 Dysarthria: 0 11 Extinction and Inattention: 0 TOTAL: 1  Labs I have reviewed labs in epic and the results pertinent to this consultation are: INR 1.1.     aPTT 25.    creatinine 1.20. Glucose 81.  Ethanol < 10.   CBC    Component Value Date/Time   WBC 5.8 08/09/2021 0828   RBC 4.13 (L) 08/09/2021 0828   HGB 13.3 08/09/2021 0925   HGB 13.1 02/15/2021 1129   HGB 14.5 10/21/2016 1324   HCT 39.0 08/09/2021 0925   HCT 43.8 10/21/2016 1324   PLT 112 (L) 08/09/2021 0828   PLT 124 (L) 02/15/2021 1129   PLT 157 10/21/2016 1324   MCV 93.2 08/09/2021 0828   MCV 90.9 10/21/2016 1324   MCH 30.3 08/09/2021 0828   MCHC 32.5 08/09/2021 0828   RDW 13.8 08/09/2021 0828   RDW 13.3 10/21/2016 1324   LYMPHSABS 0.4 (L)  08/09/2021 0828   LYMPHSABS 17.6 (H) 10/21/2016 1324   MONOABS 0.5 08/09/2021 0828   MONOABS 0.6 10/21/2016 1324   EOSABS 0.0 08/09/2021 0828   EOSABS 0.0 10/21/2016 1324   BASOSABS 0.0 08/09/2021 0828   BASOSABS 0.1 10/21/2016 1324   CMP     Component Value Date/Time   NA 139 08/09/2021 0925   NA 142 10/21/2016 1324   K 3.9 08/09/2021 0925   K 4.5 10/21/2016 1324   CL 105 08/09/2021 0925   CO2 24 08/09/2021 0828   CO2 28 10/21/2016 1324   GLUCOSE 81 08/09/2021 0925   GLUCOSE 90 10/21/2016 1324   BUN 14 08/09/2021 0925   BUN 16.2  10/21/2016 1324   CREATININE 1.20 08/09/2021 0925   CREATININE 1.00 05/21/2021 1120   CREATININE 1.1 10/21/2016 1324   CALCIUM 9.4 08/09/2021 0828   CALCIUM 10.1 10/21/2016 1324   PROT 6.1 (L) 08/09/2021 0828   PROT 7.1 10/21/2016 1324   ALBUMIN 3.7 08/09/2021 0828   ALBUMIN 3.9 10/21/2016 1324   AST 21 08/09/2021 0828   AST 11 (L) 02/15/2021 1129   AST 17 10/21/2016 1324   ALT 11 08/09/2021 0828   ALT 7 02/15/2021 1129   ALT 16 10/21/2016 1324   ALKPHOS 49 08/09/2021 0828   ALKPHOS 69 10/21/2016 1324   BILITOT 1.5 (H) 08/09/2021 0828   BILITOT 0.5 02/15/2021 1129   BILITOT 0.46 10/21/2016 1324   GFRNONAA >60 08/09/2021 0828   GFRNONAA 60 03/21/2021 1512   GFRAA 70 03/21/2021 1512   Lipid Panel 8/22    Component Value Date/Time   CHOL 160 05/21/2021 1120   TRIG 83 05/21/2021 1120   HDL 45 05/21/2021 1120   CHOLHDL 3.6 05/21/2021 1120   VLDL 23 12/30/2016 1059   LDLCALC 98 05/21/2021 1120   Imaging MD reviewed the images obtained.  CT head Atrophy with small vessel chronic ischemic changes of deep cerebral white matter. No acute intracranial abnormalities.  MRI brain No evidence of acute intracranial abnormality, including acute infarct.  Assessment: 80 yo male with risk factors for stroke of HTN, HLD, previous stroke, history of tobacco abuse, and vascular dementia. His left sided weakness was resolved on admission,  therefore, this is likely TIA vs debility and history of generalized weakness. No seizure activity at seen and patient was not post-ictal on arrival, so doubt seizure. MRIb negative for stroke. OSW for any intervention. He is on a statin with normal LDL and A1c in August, ASA 81mg , and BP today is controlled. ABCD2 score 5, so with moderate risk for recurrent stroke. Given TIA and ABCD2 score, will start Plavix as well.   Impression: -likely TIA vs. Generalized worsening debility.  -vascular dementia.   Recommendations/Plan:  -Medicine admit for TIA workup.  -No need to r/p Lipid panel or A1c as LDL and A1c are at goal.  -Continue statin. -DAPT-Plavix load, then 75mg  po qd x 21 days.  -ASA load, then ASA 81mg  po qd and ongoing.   -MRA head and neck.  -stroke education.  - MRI Cervical spine w/o contrast.  Pt seen by Clance Boll, NP/Neuro and later by MD. Note/plan to be edited by MD as needed.  Pager: [phone]  NEUROHOSPITALIST ADDENDUM Performed a face to face diagnostic evaluation.   I have reviewed the contents of history and physical exam as documented by PA/ARNP/Resident and agree with above documentation.  I have discussed and formulated the above plan as documented. Edits to the note have been made as needed.  Impression:/Key exam findings/Plan: consulted for episode of left sided weakness on top of his chronic progressive weakness and falls. The left sided weakness resolved and he is back to his baseline. In addition to the TIA workup, he is hyperreflexic on exam. He was last seen by Dr. Gordy Clement in Neurology clinic and reflexes were normal per documented exam. Will get MRI C spine w/o contrast to rule out cord compression. I suspect that he also has underlying deconditioning. Given his dementia, he is a poor historian and unable to reliably exclude potential cord issues with history alone. Dementia labs in the past have been unremarkable.  I was unable to get in touch with his  wife probably because it is late at night.  Donnetta Simpers, MD [facility] [phone]   If 7pm to 7am, please call on call as listed on AMION.

## 2021-08-09 NOTE — ED Notes (Signed)
ED TO INPATIENT HANDOFF REPORT  ED Nurse Name and Phone #: [place] [phone]  S Name/Age/Gender Javier [name] 80 y.o. male Room/Bed: 040C/040C  Code Status   Code Status: Full Code  Home/SNF/Other Home Patient oriented to: self, place, time, and situation Is this baseline? No   Triage Complete: Triage complete  Chief Complaint Left-sided weakness [R53.1]  Triage Note Pt dropped off by wife who reports pt fell out of bed this morning. Pt sts he tried to get out of the bed and his L side was so weak that he fell and had to be helped up by wife. Denies LOC or hitting head. LSN 2030 last night.    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    Level of Care/Admitting Diagnosis ED Disposition     ED Disposition  Admit   Condition  --   [place]: [place] [[id number]]  Level of Care: Telemetry Medical [104]  May place patient in observation at [facility] or [place] if equivalent level of care is available:: No  Covid Evaluation: Asymptomatic Screening Protocol (No Symptoms)  Diagnosis: Left-sided weakness [527782]  Admitting Physician: Norval Morton [[id number]]  Attending Physician: Norval Morton [[id number]]          B Medical/Surgery History Past Medical History:  Diagnosis Date   Diverticulosis    Hyperlipidemia    Hypertension    Memory loss    Prediabetes    Vitamin D deficiency    Past Surgical History:  Procedure Laterality Date   NO PAST SURGERIES       A IV Location/Drains/Wounds Patient Lines/Drains/Airways Status     Active Line/Drains/Airways     Name Placement date Placement time Site Days   Peripheral IV 20 G Right Antecubital --  --  Antecubital  --            Intake/Output Last 24 hours No intake or output  data in the 24 hours ending 08/09/21 1422  Labs/Imaging Results for orders placed or performed during the hospital encounter of 08/09/21 (from the past 48 hour(s))  Resp Panel by RT-PCR (Flu A&B, Covid) Nasopharyngeal Swab     Status: Abnormal   Collection Time: 08/09/21  8:28 AM   Specimen: Nasopharyngeal Swab; Nasopharyngeal(NP) swabs in vial transport medium  Result Value Ref Range   SARS Coronavirus 2 by RT PCR NEGATIVE NEGATIVE    Comment: (NOTE) SARS-CoV-2 target nucleic acids are NOT DETECTED.  The SARS-CoV-2 RNA is generally detectable in upper respiratory specimens during the acute phase of infection. The lowest concentration of SARS-CoV-2 viral copies this assay can detect is 138 copies/mL. A negative result does not preclude SARS-Cov-2 infection and should not be used as the sole basis for treatment or other patient management decisions. A negative result may occur with  improper specimen collection/handling, submission of specimen other than nasopharyngeal swab, presence of viral mutation(s) within the areas targeted by this assay, and inadequate number of viral copies(<138 copies/mL). A negative result must be combined with clinical observations, patient history, and epidemiological information. The expected result is Negative.  Fact Sheet for Patients:  EntrepreneurPulse.com.au  Fact Sheet for Healthcare Providers:  IncredibleEmployment.be  This test is no t yet approved or cleared by the [place] FDA and  has been  authorized for detection and/or diagnosis of SARS-CoV-2 by FDA under an Emergency Use Authorization (EUA). This EUA will remain  in effect (meaning this test can be used) for the duration of the COVID-19 declaration under Section 564(b)(1) of the Act, 21 U.S.C.section 360bbb-3(b)(1), unless the authorization is terminated  or revoked sooner.       Influenza A by PCR POSITIVE (A) NEGATIVE   Influenza B by PCR  NEGATIVE NEGATIVE    Comment: (NOTE) The Xpert Xpress SARS-CoV-2/FLU/RSV plus assay is intended as an aid in the diagnosis of influenza from Nasopharyngeal swab specimens and should not be used as a sole basis for treatment. Nasal washings and aspirates are unacceptable for Xpert Xpress SARS-CoV-2/FLU/RSV testing.  Fact Sheet for Patients: EntrepreneurPulse.com.au  Fact Sheet for Healthcare Providers: IncredibleEmployment.be  This test is not yet approved or cleared by the [place] FDA and has been authorized for detection and/or diagnosis of SARS-CoV-2 by FDA under an Emergency Use Authorization (EUA). This EUA will remain in effect (meaning this test can be used) for the duration of the COVID-19 declaration under Section 564(b)(1) of the Act, 21 U.S.C. section 360bbb-3(b)(1), unless the authorization is terminated or revoked.  Performed at [facility], [place] [address], [place], [place] [address]   Protime-INR     Status: None   Collection Time: 08/09/21  8:28 AM  Result Value Ref Range   Prothrombin Time 14.5 11.4 - 15.2 seconds   INR 1.1 0.8 - 1.2    Comment: (NOTE) INR goal varies based on device and disease states. Performed at [facility], [place] [address], [place], [place] [address]   APTT     Status: None   Collection Time: 08/09/21  8:28 AM  Result Value Ref Range   aPTT 25 24 - 36 seconds    Comment: Performed at [facility] [address], [place], [place] [address]  CBC     Status: Abnormal   Collection Time: 08/09/21  8:28 AM  Result Value Ref Range   WBC 5.8 4.0 - 10.5 K/uL   RBC 4.13 (L) 4.22 - 5.81 MIL/uL   Hemoglobin 12.5 (L) 13.0 - 17.0 g/dL   HCT 38.5 (L) 39.0 - 52.0 %   MCV 93.2 80.0 - 100.0 fL   MCH 30.3 26.0 - 34.0 pg   MCHC 32.5 30.0 - 36.0 g/dL   RDW 13.8 11.5 - 15.5 %   Platelets 112 (L) 150 - 400 K/uL    Comment: REPEATED TO VERIFY PLATELET COUNT CONFIRMED BY SMEAR    nRBC 0.0  0.0 - 0.2 %    Comment: Performed at [facility], [place] [address], [place], [place] [address]  Differential     Status: Abnormal   Collection Time: 08/09/21  8:28 AM  Result Value Ref Range   Neutrophils Relative % 83 %   Neutro Abs 4.9 1.7 - 7.7 K/uL   Lymphocytes Relative 7 %   Lymphs Abs 0.4 (L) 0.7 - 4.0 K/uL   Monocytes Relative 9 %   Monocytes Absolute 0.5 0.1 - 1.0 K/uL   Eosinophils Relative 0 %   Eosinophils Absolute 0.0 0.0 - 0.5 K/uL   Basophils Relative 1 %   Basophils Absolute 0.0 0.0 - 0.1 K/uL   WBC Morphology MORPHOLOGY UNREMARKABLE    RBC Morphology See Note    Smear Review PLATELETS APPEAR DECREASED    Immature Granulocytes 0 %   Abs Immature Granulocytes 0.01 0.00 - 0.07  K/uL   Ovalocytes PRESENT     Comment: Performed at [facility], [place] [address], [place], [place] [address]  Comprehensive metabolic panel     Status: Abnormal   Collection Time: 08/09/21  8:28 AM  Result Value Ref Range   Sodium 137 135 - 145 mmol/L   Potassium 4.5 3.5 - 5.1 mmol/L   Chloride 106 98 - 111 mmol/L   CO2 24 22 - 32 mmol/L   Glucose, Bld 81 70 - 99 mg/dL    Comment: Glucose reference range applies only to samples taken after fasting for at least 8 hours.   BUN 14 8 - 23 mg/dL   Creatinine, Ser 1.20 0.61 - 1.24 mg/dL   Calcium 9.4 8.9 - 10.3 mg/dL   Total Protein 6.1 (L) 6.5 - 8.1 g/dL   Albumin 3.7 3.5 - 5.0 g/dL   AST 21 15 - 41 U/L   ALT 11 0 - 44 U/L   Alkaline Phosphatase 49 38 - 126 U/L   Total Bilirubin 1.5 (H) 0.3 - 1.2 mg/dL   GFR, Estimated >60 >60 mL/min    Comment: (NOTE) Calculated using the CKD-EPI Creatinine Equation (2021)    Anion gap 7 5 - 15    Comment: Performed at [facility], [place] [address] [place], [place] [address]  I-stat chem 8, ED     Status: None   Collection Time: 08/09/21  9:25 AM  Result Value Ref Range   Sodium 139 135 - 145 mmol/L   Potassium 3.9 3.5 - 5.1 mmol/L   Chloride 105 98 - 111 mmol/L   BUN 14 8 - 23  mg/dL   Creatinine, Ser 1.20 0.61 - 1.24 mg/dL   Glucose, Bld 81 70 - 99 mg/dL    Comment: Glucose reference range applies only to samples taken after fasting for at least 8 hours.   Calcium, Ion 1.23 1.15 - 1.40 mmol/L   TCO2 25 22 - 32 mmol/L   Hemoglobin 13.3 13.0 - 17.0 g/dL   HCT 39.0 39.0 - 52.0 %  Ethanol     Status: None   Collection Time: 08/09/21  9:39 AM  Result Value Ref Range   Alcohol, Ethyl (B) <10 <10 mg/dL    Comment: (NOTE) Lowest detectable limit for serum alcohol is 10 mg/dL.  For medical purposes only. Performed at [facility], [place] [address], [place], [place] [address]    DG Chest 2 View  Result Date: 08/09/2021 CLINICAL DATA:  Status post fall.  Left shoulder pain. EXAM: CHEST - 2 VIEW COMPARISON:  None. FINDINGS: The heart size and mediastinal contours are within normal limits. Both lungs are clear. The visualized skeletal structures are unremarkable. IMPRESSION: No active cardiopulmonary disease. Electronically Signed   By: Kathreen Devoid M.D.   On: 08/09/2021 08:58   CT HEAD WO CONTRAST  Result Date: 08/09/2021 CLINICAL DATA:  [place] out of bed this morning, LEFT side weakness EXAM: CT HEAD WITHOUT CONTRAST CT CERVICAL SPINE WITHOUT CONTRAST TECHNIQUE: Multidetector CT imaging of the head and cervical spine was performed following the standard protocol without intravenous contrast. Multiplanar CT image reconstructions of the cervical spine were also generated. COMPARISON:  CT head 04/20/2021 FINDINGS: CT HEAD FINDINGS Brain: Generalized atrophy. Normal ventricular morphology. No midline shift or mass effect. Mild small vessel chronic ischemic changes of deep cerebral white matter. No intracranial hemorrhage, mass lesion, or evidence of acute infarction. No extra-axial fluid collections. Vascular: No hyperdense vessels. Skull: Intact  Sinuses/Orbits: Clear Other: N/A CT CERVICAL SPINE FINDINGS Alignment: Minimal retrolisthesis at C3-C4 and C4-C5 likely  degenerative. Remaining alignments normal. Skull base and vertebrae: Osseous mineralization normal. Multilevel disc space narrowing and endplate spur formation. Multilevel facet degenerative changes, mild. No fracture or bone destruction. No additional subluxation. Uncovertebral spurs encroach upon cervical neural foramina bilaterally at multiple levels. Soft tissues and spinal canal: Prevertebral soft tissues normal thickness. Remaining visualized cervical soft tissues unremarkable Disc levels:  No significant abnormalities. Upper chest: Lung apices emphysematous but clear Other: N/A IMPRESSION: Atrophy with small vessel chronic ischemic changes of deep cerebral white matter. No acute intracranial abnormalities. Multilevel degenerative disc and facet disease changes of the cervical spine as above. No acute cervical spine abnormalities. Electronically Signed   By: Lavonia Dana M.D.   On: 08/09/2021 10:30   CT Cervical Spine Wo Contrast  Result Date: 08/09/2021 CLINICAL DATA:  [place] out of bed this morning, LEFT side weakness EXAM: CT HEAD WITHOUT CONTRAST CT CERVICAL SPINE WITHOUT CONTRAST TECHNIQUE: Multidetector CT imaging of the head and cervical spine was performed following the standard protocol without intravenous contrast. Multiplanar CT image reconstructions of the cervical spine were also generated. COMPARISON:  CT head 04/20/2021 FINDINGS: CT HEAD FINDINGS Brain: Generalized atrophy. Normal ventricular morphology. No midline shift or mass effect. Mild small vessel chronic ischemic changes of deep cerebral white matter. No intracranial hemorrhage, mass lesion, or evidence of acute infarction. No extra-axial fluid collections. Vascular: No hyperdense vessels. Skull: Intact Sinuses/Orbits: Clear Other: N/A CT CERVICAL SPINE FINDINGS Alignment: Minimal retrolisthesis at C3-C4 and C4-C5 likely degenerative. Remaining alignments normal. Skull base and vertebrae: Osseous mineralization normal. Multilevel disc  space narrowing and endplate spur formation. Multilevel facet degenerative changes, mild. No fracture or bone destruction. No additional subluxation. Uncovertebral spurs encroach upon cervical neural foramina bilaterally at multiple levels. Soft tissues and spinal canal: Prevertebral soft tissues normal thickness. Remaining visualized cervical soft tissues unremarkable Disc levels:  No significant abnormalities. Upper chest: Lung apices emphysematous but clear Other: N/A IMPRESSION: Atrophy with small vessel chronic ischemic changes of deep cerebral white matter. No acute intracranial abnormalities. Multilevel degenerative disc and facet disease changes of the cervical spine as above. No acute cervical spine abnormalities. Electronically Signed   By: Lavonia Dana M.D.   On: 08/09/2021 10:30   MR BRAIN WO CONTRAST  Result Date: 08/09/2021 CLINICAL DATA:  Transient ischemic attack (TIA) EXAM: MRI HEAD WITHOUT CONTRAST TECHNIQUE: Multiplanar, multiecho pulse sequences of the brain and surrounding structures were obtained without intravenous contrast. COMPARISON:  CT in the same day. FINDINGS: Brain: No acute infarction, hemorrhage, hydrocephalus, extra-axial collection or mass lesion. Mild T2 hyperintensity in the white matter, nonspecific but compatible with chronic microvascular ischemic disease that is very mild for patient age. Mild for age atrophy with mild prominence of the extra-axial spaces bilaterally. Vascular: Major arterial flow voids are maintained skull base Skull and upper cervical spine: Normal marrow signal. Sinuses/Orbits: Clear sinuses.  Unremarkable orbits. Other: No mastoid effusions. IMPRESSION: No evidence of acute intracranial abnormality, including acute infarct. Electronically Signed   By: Margaretha Sheffield M.D.   On: 08/09/2021 12:48   DG Shoulder Left  Result Date: 08/09/2021 CLINICAL DATA:  Status post fall.  Left shoulder pain. EXAM: LEFT SHOULDER - 2+ VIEW COMPARISON:  None.  FINDINGS: No fracture or dislocation. Tiny inferior humeral marginal osteophyte. Mild degenerative changes of the acromioclavicular joint. Soft tissues are normal. IMPRESSION: No acute osseous injury of the left shoulder. Electronically Signed  By: Kathreen Devoid M.D.   On: 08/09/2021 08:59    Pending Labs Unresulted Labs (From admission, onward)     Start     Ordered   08/10/21 0500  CBC with Differential/Platelet  Tomorrow morning,   R        08/09/21 1416   08/09/21 1338  TSH  Add-on,   AD        08/09/21 1338   08/09/21 0828  Urine rapid drug screen (hosp performed)  ONCE - STAT,   STAT        08/09/21 0829   08/09/21 0828  Urinalysis, Routine w reflex microscopic  ONCE - STAT,   STAT        08/09/21 0829            Vitals/Pain Today's Vitals   08/09/21 0930 08/09/21 1100 08/09/21 1115 08/09/21 1130  BP: 137/62 124/80 (!) 143/61 136/64  Pulse:  80    Resp: (!) 35 (!) 22 19 19   Temp:      TempSrc:      SpO2:  95%    PainSc:        Isolation Precautions No active isolations  Medications Medications   stroke: mapping our early stages of recovery book (has no administration in time range)  acetaminophen (TYLENOL) tablet 650 mg (has no administration in time range)    Or  acetaminophen (TYLENOL) 160 MG/5ML solution 650 mg (has no administration in time range)    Or  acetaminophen (TYLENOL) suppository 650 mg (has no administration in time range)  enoxaparin (LOVENOX) injection 40 mg (40 mg Subcutaneous Given 08/09/21 1401)  oseltamivir (TAMIFLU) capsule 75 mg (75 mg Oral Given 08/09/21 1405)  guaiFENesin (MUCINEX) 12 hr tablet 600 mg (has no administration in time range)  0.9 %  sodium chloride infusion ( Intravenous New Bag/Given 08/09/21 1401)    Mobility walks with person assist Moderate fall risk   Focused Assessments Neuro Assessment Handoff:  Swallow screen pass? Yes  Cardiac Rhythm: Normal sinus rhythm NIH Stroke Scale ( + Modified Stroke Scale  Criteria)  Interval: Shift assessment Level of Consciousness (1a.)   : Alert, keenly responsive LOC Questions (1b. )   +: Answers both questions correctly LOC Commands (1c. )   + : Performs both tasks correctly Best Gaze (2. )  +: Normal Visual (3. )  +: No visual loss Facial Palsy (4. )    : Normal symmetrical movements Motor Arm, Left (5a. )   +: No drift Motor Arm, Right (5b. )   +: No drift Motor Leg, Left (6a. )   +: No drift Motor Leg, Right (6b. )   +: No drift Limb Ataxia (7. ): Absent Sensory (8. )   +: Normal, no sensory loss Best Language (9. )   +: No aphasia Dysarthria (10. ): Normal Extinction/Inattention (11.)   +: No Abnormality Modified SS Total  +: 0 Complete NIHSS TOTAL: 0     Neuro Assessment: Within Defined Limits Neuro Checks:   Shift assessment (08/09/21 1402)  Last Documented NIHSS Modified Score: 0 (08/09/21 1402) Has TPA been given? No If patient is a Neuro Trauma and patient is going to OR before floor call report to [place] nurse: [phone] or [phone]  , Pulmonary Assessment Handoff:  Lung sounds:   O2 Device: Room Air      R Recommendations: See Admitting Provider Note  Report given to:   Additional Notes: none

## 2021-08-09 NOTE — H&P (Signed)
History and Physical    Javier [name] YKD:[id number] DOB: [birth year] DOA: 08/09/2021  Referring MD/NP/PA: Benedetto Goad, PA-C PCP: Unk Pinto, MD  Consultants:Gould, Corene Cornea, [place] as Referring Physician (Optometry) Danis, Kirke Corin, MD as Consulting Physician (Gastroenterology) Brunetta Genera, MD as Consulting Physician (Hematology) Patient coming from: Home  Chief Complaint: Fall  I have personally briefly reviewed patient's old medical records in [place]   HPI: Javier [name] is a 80 y.o. right-handed male with medical history significant of hypertension, hyperlipidemia, CLL, memory loss, BPH, and prediabetes who presents after having a fall at home with complaints of left-sided weakness.  History is obtained from the patient with the assistance of his wife due to issues with memory.  He went to sleep last night in his normal state of health around 8:30 PM.  His wife makes note that he has been weak for at least 8 months now normally holding onto things to help him get around the house.  This morning he had asked for help to the bathroom due to him stating that his left side was not working.  His wife helped assist him to the bathroom and in helping him try to get back to the bed his legs slipped and he fell onto his back.  Patient complained of left shoulder pain after the fall.  She notes that here recently he had been having more cough and sputum production.  She thinks that she had a cold and had possibly just passed it to him last 2 days.  In talking to his wife she makes it known that he has been seen by several doctors for the weakness including a psychiatrist and had multiple tests performed, but have not been able to find a clear cause.  He intermittently complains of hot/ cold flashes and breaks out in sweats, but none of this appears to be new and has been going on for several months in addition to weight loss.  He had a prior possibly 30 smoking pack year history,  but quit possibly 20 years ago.  Denied any significant history of drug or alcohol use.  She admits that his symptoms seem to be acutely worsened, and she would need help at home if he is not able to walk on his own.  The only other symptom that she reports is urinary frequency for which he is on medication, but it does not seem to help.  ED Course: Upon admission into the emergency department patient was seen to be afebrile with respirations 16-35, all other vital signs maintained.  Labs significant for hemoglobin 12.5, platelets 112, and total bilirubin 1.5.  CT scan of the head and neck noted atrophy with small vessel chronic ischemic changes of the central white matter with no acute intracranial abnormality.  He was not a tPA candidate due to time frame of presentation.  Chest x-ray and x-rays of the left shoulder showed no acute abnormalities. Neurology have been formally consulted.  TRH called to admit for possible TIA work-up.  Review of Systems  Constitutional:  Positive for malaise/fatigue and weight loss.  Eyes:  Negative for photophobia and pain.  Respiratory:  Positive for cough and shortness of breath.   Cardiovascular:  Negative for chest pain and leg swelling.  Genitourinary:  Positive for frequency.  Musculoskeletal:  Positive for joint pain.  Skin:  Negative for rash.  Neurological:  Negative for loss of consciousness.  Psychiatric/Behavioral:  Positive for memory loss.   Otherwise a complete  12 point review of systems was performed negative except for as noted above in the HPI.  Past Medical History:  Diagnosis Date   Diverticulosis    Hyperlipidemia    Hypertension    Memory loss    Prediabetes    Vitamin D deficiency     Past Surgical History:  Procedure Laterality Date   NO PAST SURGERIES       reports that he quit smoking about 18 years ago. His smoking use included cigarettes. He has never used smokeless tobacco. He reports that he does not currently use alcohol.  He reports that he does not use drugs.  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 Authorizing Provider  ALPRAZolam (XANAX) 0.5 MG tablet Take 1/2 - 1 tablet 2 x /day ONLY if needed for Anxiety Attack &  limit to 5 days /week to avoid Addiction & Dementia / - Recommend use sparingly - NOT ROUTINE !  - Rx to last 1 month 07/15/21   Unk Pinto, MD  Alum & Mag Hydroxide-Simeth (MYLANTA PO) Take by mouth as needed.    [provider]  aspirin EC 81 MG tablet Take 81 mg by mouth daily.    [provider]  busPIRone (BUSPAR) 5 MG tablet Take 1 tablet (5 mg total) by mouth 3 (three) times daily. 05/21/21 08/19/21  Magda Bernheim, NP  Cholecalciferol (VITAMIN D3) 5000 units TABS Take 5,000 Units by mouth daily.    [provider]  losartan-hydrochlorothiazide (HYZAAR) 100-25 MG tablet Take  1 tablet  Daily  for BP & Fluid Retention /Ankle Swelling 12/03/20   Unk Pinto, MD  pravastatin (PRAVACHOL) 40 MG tablet TAKE 1 TABLET AT BEDTIME FOR CHOLESTEROL. 01/09/21   Liane Comber, NP  rosuvastatin (CRESTOR) 10 MG tablet Take 1 tablet Daily for Cholesterol 05/07/21   Unk Pinto, MD  sertraline (ZOLOFT) 50 MG tablet Take 1 tablet (50 mg total) by mouth daily. 05/21/21 08/19/21  Magda Bernheim, NP  tamsulosin (FLOMAX) 0.4 MG CAPS capsule Take 1 capsule (0.4 mg total) by mouth daily after supper. For prostate. 01/09/21   Liane Comber, NP  triamcinolone (NASACORT) 55 MCG/ACT AERO nasal inhaler Place 2 sprays into the nose at  bedtime. Patient taking differently: Place 2 sprays into the nose as needed. 01/21/19 05/11/20  Vladimir Crofts, PA-C    Physical Exam:  Constitutional: Elderly male currently in NAD, calm, comfortable Vitals:   08/09/21 0930 08/09/21 1100 08/09/21 1115 08/09/21 1130  BP: 137/62 124/80 (!) 143/61 136/64  Pulse:  80    Resp: (!) 35 (!) 22 19 19   Temp:      TempSrc:      SpO2:  95%     Eyes: PERRL, lids and conjunctivae normal ENMT: Mucous membranes are dry. Posterior pharynx clear of any exudate or lesions.  Neck: normal, supple, no masses, no thyromegaly Respiratory: clear to auscultation bilaterally, no wheezing, no crackles. Normal respiratory effort. No  accessory muscle use.  Cardiovascular: Regular rate and rhythm, no murmurs / rubs / gallops. No extremity edema. 2+ pedal pulses. No carotid bruits.  Abdomen: no tenderness, no masses palpated. No hepatosplenomegaly. Bowel sounds positive.  Musculoskeletal: no clubbing / cyanosis. No joint deformity upper and lower extremities. Good ROM, no contractures. Normal muscle tone.  Skin: no rashes, lesions, ulcers. No induration Neurologic: CN 2-12 grossly intact.  Able to move all extremities. Psychiatric: Poor recent memory. Alert and oriented x 3.  Flat affect    Labs on Admission: I have personally reviewed following labs and imaging studies  CBC: Recent Labs  Lab 08/09/21 0828 08/09/21 0925  WBC 5.8  --   NEUTROABS 4.9  --   HGB 12.5* 13.3  HCT 38.5* 39.0  MCV 93.2  --   PLT 112*  --    Basic Metabolic Panel: Recent Labs  Lab 08/09/21 0828 08/09/21 0925  NA 137 139  K 4.5 3.9  CL 106 105  CO2 24  --   GLUCOSE 81 81  BUN 14 14  CREATININE 1.20 1.20  CALCIUM 9.4  --    GFR: CrCl cannot be calculated (Unknown ideal weight.). Liver Function Tests: Recent Labs  Lab 08/09/21 0828  AST 21  ALT 11  ALKPHOS 49  BILITOT 1.5*  PROT 6.1*  ALBUMIN 3.7   No results for input(s): LIPASE, AMYLASE in the last 168  hours. No results for input(s): AMMONIA in the last 168 hours. Coagulation Profile: Recent Labs  Lab 08/09/21 0828  INR 1.1   Cardiac Enzymes: No results for input(s): CKTOTAL, CKMB, CKMBINDEX, TROPONINI in the last 168 hours. BNP (last 3 results) No results for input(s): PROBNP in the last 8760 hours. HbA1C: No results for input(s): HGBA1C in the last 72 hours. CBG: No results for input(s): GLUCAP in the last 168 hours. Lipid Profile: No results for input(s): CHOL, HDL, LDLCALC, TRIG, CHOLHDL, LDLDIRECT in the last 72 hours. Thyroid Function Tests: No results for input(s): TSH, T4TOTAL, FREET4, T3FREE, THYROIDAB in the last 72 hours. Anemia Panel: No results for input(s): VITAMINB12, FOLATE, FERRITIN, TIBC, IRON, RETICCTPCT in the last 72 hours. Urine analysis:    Component Value Date/Time   COLORURINE YELLOW 04/20/2021 1830   APPEARANCEUR CLEAR 04/20/2021 1830   LABSPEC >1.046 (H) 04/20/2021 1830   PHURINE 7.0 04/20/2021 1830   GLUCOSEU NEGATIVE 04/20/2021 1830   HGBUR NEGATIVE 04/20/2021 1830   BILIRUBINUR NEGATIVE 04/20/2021 1830   KETONESUR NEGATIVE 04/20/2021 1830   PROTEINUR NEGATIVE 04/20/2021 1830   NITRITE NEGATIVE 04/20/2021 1830   LEUKOCYTESUR NEGATIVE 04/20/2021 1830   Sepsis Labs: Recent Results (from the past 240 hour(s))  Resp Panel by RT-PCR (Flu A&B, Covid) Nasopharyngeal Swab     Status: Abnormal   Collection Time: 08/09/21  8:28 AM   Specimen: Nasopharyngeal Swab; Nasopharyngeal(NP) swabs in vial transport medium  Result Value Ref Range Status   SARS Coronavirus 2 by RT PCR NEGATIVE NEGATIVE Final    Comment: (NOTE) SARS-CoV-2 target nucleic acids are NOT DETECTED.  The SARS-CoV-2 RNA is generally detectable in upper respiratory specimens during the acute phase of infection. The lowest concentration of SARS-CoV-2 viral copies this assay can detect is 138 copies/mL. A negative result does not preclude SARS-Cov-2 infection and should not be used as  the sole basis for treatment or other patient management decisions. A negative result may occur with  improper specimen collection/handling, submission of specimen other than nasopharyngeal swab, presence of viral mutation(s) within the areas targeted  by this assay, and inadequate number of viral copies(<138 copies/mL). A negative result must be combined with clinical observations, patient history, and epidemiological information. The expected result is Negative.  Fact Sheet for Patients:  EntrepreneurPulse.com.au  Fact Sheet for Healthcare Providers:  IncredibleEmployment.be  This test is no t yet approved or cleared by the [place] FDA and  has been authorized for detection and/or diagnosis of SARS-CoV-2 by FDA under an Emergency Use Authorization (EUA). This EUA will remain  in effect (meaning this test can be used) for the duration of the COVID-19 declaration under Section 564(b)(1) of the Act, 21 U.S.C.section 360bbb-3(b)(1), unless the authorization is terminated  or revoked sooner.       Influenza A by PCR POSITIVE (A) NEGATIVE Final   Influenza B by PCR NEGATIVE NEGATIVE Final    Comment: (NOTE) The Xpert Xpress SARS-CoV-2/FLU/RSV plus assay is intended as an aid in the diagnosis of influenza from Nasopharyngeal swab specimens and should not be used as a sole basis for treatment. Nasal washings and aspirates are unacceptable for Xpert Xpress SARS-CoV-2/FLU/RSV testing.  Fact Sheet for Patients: EntrepreneurPulse.com.au  Fact Sheet for Healthcare Providers: IncredibleEmployment.be  This test is not yet approved or cleared by the [place] FDA and has been authorized for detection and/or diagnosis of SARS-CoV-2 by FDA under an Emergency Use Authorization (EUA). This EUA will remain in effect (meaning this test can be used) for the duration of the COVID-19 declaration under Section  564(b)(1) of the Act, 21 U.S.C. section 360bbb-3(b)(1), unless the authorization is terminated or revoked.  Performed at [facility], [place] [address] [place], [place] [address]      Radiological Exams on Admission: DG Chest 2 View  Result Date: 08/09/2021 CLINICAL DATA:  Status post fall.  Left shoulder pain. EXAM: CHEST - 2 VIEW COMPARISON:  None. FINDINGS: The heart size and mediastinal contours are within normal limits. Both lungs are clear. The visualized skeletal structures are unremarkable. IMPRESSION: No active cardiopulmonary disease. Electronically Signed   By: Kathreen Devoid M.D.   On: 08/09/2021 08:58   CT HEAD WO CONTRAST  Result Date: 08/09/2021 CLINICAL DATA:  [place] out of bed this morning, LEFT side weakness EXAM: CT HEAD WITHOUT CONTRAST CT CERVICAL SPINE WITHOUT CONTRAST TECHNIQUE: Multidetector CT imaging of the head and cervical spine was performed following the standard protocol without intravenous contrast. Multiplanar CT image reconstructions of the cervical spine were also generated. COMPARISON:  CT head 04/20/2021 FINDINGS: CT HEAD FINDINGS Brain: Generalized atrophy. Normal ventricular morphology. No midline shift or mass effect. Mild small vessel chronic ischemic changes of deep cerebral white matter. No intracranial hemorrhage, mass lesion, or evidence of acute infarction. No extra-axial fluid collections. Vascular: No hyperdense vessels. Skull: Intact Sinuses/Orbits: Clear Other: N/A CT CERVICAL SPINE FINDINGS Alignment: Minimal retrolisthesis at C3-C4 and C4-C5 likely degenerative. Remaining alignments normal. Skull base and vertebrae: Osseous mineralization normal. Multilevel disc space narrowing and endplate spur formation. Multilevel facet degenerative changes, mild. No fracture or bone destruction. No additional subluxation. Uncovertebral spurs encroach upon cervical neural foramina bilaterally at multiple levels. Soft tissues and spinal canal: Prevertebral soft  tissues normal thickness. Remaining visualized cervical soft tissues unremarkable Disc levels:  No significant abnormalities. Upper chest: Lung apices emphysematous but clear Other: N/A IMPRESSION: Atrophy with small vessel chronic ischemic changes of deep cerebral white matter. No acute intracranial abnormalities. Multilevel degenerative disc and facet disease changes of the cervical spine as above. No acute cervical spine abnormalities. Electronically Signed  By: Lavonia Dana M.D.   On: 08/09/2021 10:30   CT Cervical Spine Wo Contrast  Result Date: 08/09/2021 CLINICAL DATA:  [place] out of bed this morning, LEFT side weakness EXAM: CT HEAD WITHOUT CONTRAST CT CERVICAL SPINE WITHOUT CONTRAST TECHNIQUE: Multidetector CT imaging of the head and cervical spine was performed following the standard protocol without intravenous contrast. Multiplanar CT image reconstructions of the cervical spine were also generated. COMPARISON:  CT head 04/20/2021 FINDINGS: CT HEAD FINDINGS Brain: Generalized atrophy. Normal ventricular morphology. No midline shift or mass effect. Mild small vessel chronic ischemic changes of deep cerebral white matter. No intracranial hemorrhage, mass lesion, or evidence of acute infarction. No extra-axial fluid collections. Vascular: No hyperdense vessels. Skull: Intact Sinuses/Orbits: Clear Other: N/A CT CERVICAL SPINE FINDINGS Alignment: Minimal retrolisthesis at C3-C4 and C4-C5 likely degenerative. Remaining alignments normal. Skull base and vertebrae: Osseous mineralization normal. Multilevel disc space narrowing and endplate spur formation. Multilevel facet degenerative changes, mild. No fracture or bone destruction. No additional subluxation. Uncovertebral spurs encroach upon cervical neural foramina bilaterally at multiple levels. Soft tissues and spinal canal: Prevertebral soft tissues normal thickness. Remaining visualized cervical soft tissues unremarkable Disc levels:  No significant  abnormalities. Upper chest: Lung apices emphysematous but clear Other: N/A IMPRESSION: Atrophy with small vessel chronic ischemic changes of deep cerebral white matter. No acute intracranial abnormalities. Multilevel degenerative disc and facet disease changes of the cervical spine as above. No acute cervical spine abnormalities. Electronically Signed   By: Lavonia Dana M.D.   On: 08/09/2021 10:30   DG Shoulder Left  Result Date: 08/09/2021 CLINICAL DATA:  Status post fall.  Left shoulder pain. EXAM: LEFT SHOULDER - 2+ VIEW COMPARISON:  None. FINDINGS: No fracture or dislocation. Tiny inferior humeral marginal osteophyte. Mild degenerative changes of the acromioclavicular joint. Soft tissues are normal. IMPRESSION: No acute osseous injury of the left shoulder. Electronically Signed   By: Kathreen Devoid M.D.   On: 08/09/2021 08:59    EKG: Independently reviewed.  Sinus rhythm at 76 bpm  Assessment/Plan Fall secondary to weakness: Patient reportedly fell this morning and complained of worsening left-sided weakness, but on physical exam appears to be globally weak.  CT scan of the brain showed no acute abnormality.  Symptoms were thought to be secondary to a TIA for which neurology had initially been consulted.   MRI of the brain negative for any acute abnormalities.  Question if symptoms secondary to progressively worsening weakness related to influenza versus TIA.  Hemoglobin A1c was most recently noted to be within normal limits. -Admit to a medical telemetry bed -Check MRA of the head and neck -Aspirin and Plavix per neurology -PT/OT to evaluate and treat -Transitions of care consulted for possible need of home health -[place] neurology consultative services, will follow-up for further recommendations  Influenza A: Acute.  Patient was was reported to have increased productive cough.  Found to be positive for influenza A.  He had not received his flu vaccine.  Suspect that his wife could possibly  be recent sick contact. -Droplet precautions -Tamiflu -Mucinex  Pancytopenia: Acute.  On admission hemoglobin 12.5 and platelet count 112.  Suspect possibly related to acute illness versus associated with patient's CLL history. -Recheck CBC tomorrow morning  History of CLL: Patient followed in the outpatient setting by Dr. Irene Limbo.  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 -Check LDH -Dr. Irene Limbo notified of the patient admission into the hospital   Essential hypertension:  Blood pressures currently stable.  Home medication regimen includes losartan hydrochlorothiazide 100-25 mg daily. -Held blood pressure medication as diuretic could be a possible cause of worsening weakness in the setting of acute illness  Vascular dementia: Patient notes that he has poor recent memory.  Imaging noted atrophy of the brain with small vessel changes.  Anxiety: Home blood pressure medications include Xanax 0.5 mg daily as needed for anxiety, BuSpar 5 mg 3 times daily, and Zoloft 50 mg daily.  Review of records notes that patient had previously been advised to be weaned off Zoloft and Xanax as it could be a possible cause for weakness. -Continue current medication regimen for now  BPH: Patient reported having urinary frequency.  He has been on Flomax for quite some time, but its not reported to help. -Check urinalysis -Continue Flomax -May benefit from being placed on Proscar in addition to Flomax  Elevated total bilirubin: Acute.  Total bilirubin just mildly elevated at 1.5. -Recheck CMP tomorrow morning  Hyperlipidemia -Check CK muscle breakdown -Continue Crestor   DVT prophylaxis: Lovenox (continue to monitor platelet counts) Code Status: Full Family Communication: Wife updated at bedside Disposition Plan: Likely discharge home once medically stable Consults called: Neurology Admission status: Observation, may require less than 24-hour hospital  stay  Norval Morton MD [facility]   If 7PM-7AM, please contact night-coverage   08/09/2021, 12:17 PM

## 2021-08-09 NOTE — ED Notes (Signed)
Pt given urinal to void pt was very confused and thought he had used the urinal already but he had not

## 2021-08-10 ENCOUNTER — Observation Stay (HOSPITAL_BASED_OUTPATIENT_CLINIC_OR_DEPARTMENT_OTHER): Payer: No Typology Code available for payment source

## 2021-08-10 ENCOUNTER — Observation Stay (HOSPITAL_COMMUNITY): Payer: No Typology Code available for payment source

## 2021-08-10 DIAGNOSIS — J101 Influenza due to other identified influenza virus with other respiratory manifestations: Secondary | ICD-10-CM | POA: Diagnosis present

## 2021-08-10 DIAGNOSIS — F419 Anxiety disorder, unspecified: Secondary | ICD-10-CM | POA: Diagnosis present

## 2021-08-10 DIAGNOSIS — R059 Cough, unspecified: Secondary | ICD-10-CM | POA: Diagnosis present

## 2021-08-10 DIAGNOSIS — K219 Gastro-esophageal reflux disease without esophagitis: Secondary | ICD-10-CM | POA: Diagnosis not present

## 2021-08-10 DIAGNOSIS — R41841 Cognitive communication deficit: Secondary | ICD-10-CM | POA: Diagnosis not present

## 2021-08-10 DIAGNOSIS — Z823 Family history of stroke: Secondary | ICD-10-CM | POA: Diagnosis not present

## 2021-08-10 DIAGNOSIS — R7303 Prediabetes: Secondary | ICD-10-CM | POA: Diagnosis present

## 2021-08-10 DIAGNOSIS — C9111 Chronic lymphocytic leukemia of B-cell type in remission: Secondary | ICD-10-CM | POA: Diagnosis present

## 2021-08-10 DIAGNOSIS — M50221 Other cervical disc displacement at C4-C5 level: Secondary | ICD-10-CM | POA: Diagnosis not present

## 2021-08-10 DIAGNOSIS — I10 Essential (primary) hypertension: Secondary | ICD-10-CM | POA: Diagnosis present

## 2021-08-10 DIAGNOSIS — R531 Weakness: Secondary | ICD-10-CM

## 2021-08-10 DIAGNOSIS — Y92009 Unspecified place in unspecified non-institutional (private) residence as the place of occurrence of the external cause: Secondary | ICD-10-CM | POA: Diagnosis not present

## 2021-08-10 DIAGNOSIS — E876 Hypokalemia: Secondary | ICD-10-CM | POA: Diagnosis not present

## 2021-08-10 DIAGNOSIS — G4700 Insomnia, unspecified: Secondary | ICD-10-CM | POA: Diagnosis present

## 2021-08-10 DIAGNOSIS — M50222 Other cervical disc displacement at C5-C6 level: Secondary | ICD-10-CM | POA: Diagnosis not present

## 2021-08-10 DIAGNOSIS — Z9221 Personal history of antineoplastic chemotherapy: Secondary | ICD-10-CM | POA: Diagnosis not present

## 2021-08-10 DIAGNOSIS — Z833 Family history of diabetes mellitus: Secondary | ICD-10-CM | POA: Diagnosis not present

## 2021-08-10 DIAGNOSIS — R1312 Dysphagia, oropharyngeal phase: Secondary | ICD-10-CM | POA: Diagnosis not present

## 2021-08-10 DIAGNOSIS — R627 Adult failure to thrive: Secondary | ICD-10-CM | POA: Diagnosis present

## 2021-08-10 DIAGNOSIS — Z79899 Other long term (current) drug therapy: Secondary | ICD-10-CM | POA: Diagnosis not present

## 2021-08-10 DIAGNOSIS — W19XXXA Unspecified fall, initial encounter: Secondary | ICD-10-CM | POA: Diagnosis not present

## 2021-08-10 DIAGNOSIS — R262 Difficulty in walking, not elsewhere classified: Secondary | ICD-10-CM | POA: Diagnosis not present

## 2021-08-10 DIAGNOSIS — K8689 Other specified diseases of pancreas: Secondary | ICD-10-CM | POA: Diagnosis present

## 2021-08-10 DIAGNOSIS — I639 Cerebral infarction, unspecified: Secondary | ICD-10-CM | POA: Diagnosis not present

## 2021-08-10 DIAGNOSIS — Z87891 Personal history of nicotine dependence: Secondary | ICD-10-CM | POA: Diagnosis not present

## 2021-08-10 DIAGNOSIS — F0150 Vascular dementia without behavioral disturbance: Secondary | ICD-10-CM | POA: Diagnosis not present

## 2021-08-10 DIAGNOSIS — W010XXA Fall on same level from slipping, tripping and stumbling without subsequent striking against object, initial encounter: Secondary | ICD-10-CM | POA: Diagnosis present

## 2021-08-10 DIAGNOSIS — M4802 Spinal stenosis, cervical region: Secondary | ICD-10-CM | POA: Diagnosis not present

## 2021-08-10 DIAGNOSIS — E785 Hyperlipidemia, unspecified: Secondary | ICD-10-CM | POA: Diagnosis present

## 2021-08-10 DIAGNOSIS — G459 Transient cerebral ischemic attack, unspecified: Secondary | ICD-10-CM | POA: Diagnosis not present

## 2021-08-10 DIAGNOSIS — D61818 Other pancytopenia: Secondary | ICD-10-CM | POA: Diagnosis present

## 2021-08-10 DIAGNOSIS — E782 Mixed hyperlipidemia: Secondary | ICD-10-CM | POA: Diagnosis not present

## 2021-08-10 DIAGNOSIS — R2689 Other abnormalities of gait and mobility: Secondary | ICD-10-CM | POA: Diagnosis not present

## 2021-08-10 DIAGNOSIS — E559 Vitamin D deficiency, unspecified: Secondary | ICD-10-CM | POA: Diagnosis present

## 2021-08-10 DIAGNOSIS — Z20822 Contact with and (suspected) exposure to covid-19: Secondary | ICD-10-CM | POA: Diagnosis present

## 2021-08-10 DIAGNOSIS — Z8673 Personal history of transient ischemic attack (TIA), and cerebral infarction without residual deficits: Secondary | ICD-10-CM | POA: Diagnosis not present

## 2021-08-10 DIAGNOSIS — M4712 Other spondylosis with myelopathy, cervical region: Secondary | ICD-10-CM | POA: Diagnosis present

## 2021-08-10 DIAGNOSIS — N400 Enlarged prostate without lower urinary tract symptoms: Secondary | ICD-10-CM | POA: Diagnosis present

## 2021-08-10 DIAGNOSIS — M50223 Other cervical disc displacement at C6-C7 level: Secondary | ICD-10-CM | POA: Diagnosis not present

## 2021-08-10 DIAGNOSIS — M6281 Muscle weakness (generalized): Secondary | ICD-10-CM | POA: Diagnosis not present

## 2021-08-10 DIAGNOSIS — Z880 Allergy status to penicillin: Secondary | ICD-10-CM | POA: Diagnosis not present

## 2021-08-10 LAB — CBC WITH DIFFERENTIAL/PLATELET
Abs Immature Granulocytes: 0.04 10*3/uL (ref 0.00–0.07)
Basophils Absolute: 0 10*3/uL (ref 0.0–0.1)
Basophils Relative: 1 %
Eosinophils Absolute: 0 10*3/uL (ref 0.0–0.5)
Eosinophils Relative: 0 %
HCT: 38.9 % — ABNORMAL LOW (ref 39.0–52.0)
Hemoglobin: 12.6 g/dL — ABNORMAL LOW (ref 13.0–17.0)
Immature Granulocytes: 1 %
Lymphocytes Relative: 15 %
Lymphs Abs: 0.9 10*3/uL (ref 0.7–4.0)
MCH: 30.2 pg (ref 26.0–34.0)
MCHC: 32.4 g/dL (ref 30.0–36.0)
MCV: 93.3 fL (ref 80.0–100.0)
Monocytes Absolute: 0.3 10*3/uL (ref 0.1–1.0)
Monocytes Relative: 5 %
Neutro Abs: 4.8 10*3/uL (ref 1.7–7.7)
Neutrophils Relative %: 78 %
Platelets: DECREASED 10*3/uL (ref 150–400)
RBC: 4.17 MIL/uL — ABNORMAL LOW (ref 4.22–5.81)
RDW: 14.5 % (ref 11.5–15.5)
WBC: 6.1 10*3/uL (ref 4.0–10.5)
nRBC: 0 % (ref 0.0–0.2)

## 2021-08-10 LAB — ECHOCARDIOGRAM COMPLETE
AR max vel: 2.95 cm2
AV Area VTI: 3.06 cm2
AV Area mean vel: 2.88 cm2
AV Mean grad: 3 mmHg
AV Peak grad: 6.1 mmHg
Ao pk vel: 1.23 m/s
Area-P 1/2: 3.33 cm2

## 2021-08-10 LAB — COMPREHENSIVE METABOLIC PANEL
ALT: 14 U/L (ref 0–44)
AST: 24 U/L (ref 15–41)
Albumin: 3.6 g/dL (ref 3.5–5.0)
Alkaline Phosphatase: 50 U/L (ref 38–126)
Anion gap: 10 (ref 5–15)
BUN: 15 mg/dL (ref 8–23)
CO2: 24 mmol/L (ref 22–32)
Calcium: 9.2 mg/dL (ref 8.9–10.3)
Chloride: 105 mmol/L (ref 98–111)
Creatinine, Ser: 1.04 mg/dL (ref 0.61–1.24)
GFR, Estimated: >60 mL/min (ref >60)
Glucose, Bld: 75 mg/dL (ref 70–99)
Potassium: 3.5 mmol/L (ref 3.5–5.1)
Sodium: 139 mmol/L (ref 135–145)
Total Bilirubin: 0.9 mg/dL (ref 0.3–1.2)
Total Protein: 6.4 g/dL — ABNORMAL LOW (ref 6.5–8.1)

## 2021-08-10 LAB — LIPID PANEL
Cholesterol: 174 mg/dL (ref 0–200)
HDL: 55 mg/dL (ref >40)
LDL Cholesterol: 103 mg/dL — ABNORMAL HIGH (ref 0–99)
Total CHOL/HDL Ratio: 3.2 RATIO
Triglycerides: 79 mg/dL (ref <150)
VLDL: 16 mg/dL (ref 0–40)

## 2021-08-10 LAB — HEMOGLOBIN A1C
Hgb A1c MFr Bld: 5.6 % (ref 4.8–5.6)
Mean Plasma Glucose: 114.02 mg/dL

## 2021-08-10 LAB — TSH: TSH: 1 u[IU]/mL (ref 0.350–4.500)

## 2021-08-10 LAB — LACTATE DEHYDROGENASE: LDH: 173 U/L (ref 98–192)

## 2021-08-10 MED ORDER — ROSUVASTATIN CALCIUM 20 MG PO TABS
20.0000 mg | ORAL_TABLET | Freq: Every day | ORAL | Status: DC
  Administered 2021-08-11 – 2021-08-20 (×10): 20 mg via ORAL
  Filled 2021-08-10 (×10): qty 1

## 2021-08-10 NOTE — Progress Notes (Signed)
PROGRESS NOTE    Javier [name]  XBD:[id number] DOB: 22-Oct-1940 DOA: 08/09/2021 PCP: Unk Pinto, MD   Chief Complaint  Patient presents with   Fall   Weakness   Brief Narrative/Hospital Course:  Javier [name], 80 y.o. male with PMH of  hypertension, hyperlipidemia, CLL, memory loss, BPH, and prediabetes, generalized weakness past 8 months normally holding onto things to help to get around the house presents after having a fall at home with complaints of left-sided weakness, also having more cough and sputum production recently last 2 days however had some cold and has seen several doctors for the weakness including psychiatrist and had multiple test not able to find a clear cause. In the ED  hemoglobin 12.5, platelets 112, and total bilirubin 1.5, he tested positive for influenza A, underwent extensive work-up with DG left shoulder, chest x-ray CT brain, CT's C-spine MRI brain no acute finding no acute infarct no osseous abnormalities but with multilevel DJD and cervical spine.  Chest x-ray no evidence of pneumonia.  Due to concern for stroke neurology was consulted   Subjective: Seen and examined this morning patient is alert awake oriented to self current place. Working with physical therapy reports he is sometimes weak sometimes stronger.  Has mild dementia Assessment & Plan:  Fall with left-sided weakness TIA Work-up so far with influenza A positive likely attributing to his weakness and fall.  Also suspecting TIA no evidence of acute stroke and MRI brain. MRA head and MRA neck no evidence of LVO or other acute finding. Appreciate neurology input-MRI cervical spine pending.  He has Generalized weakness ongoing x8 months Physical deconditioning/debility as well :On aspirin Plavix and statin.  PT OT neurochecks  Influenza A: Patient symptomatic continue on Tamiflu droplet precaution Mucinex and supportive care  Hyperlipidemia Hypertension: BP is controlled.  On Crestor.   Allow permissive hypertension.  Vascular dementia  Anxiety:  On BuSpar and Zoloft.  Previously was advised to wean off Zoloft and Xanax as it could be possible cause for weakness.  Continue supportive care delirium precaution fall precaution  history of CLL  Thrombocytopenia: CLL followed by Dr. Irene Limbo.  Mild thrombocytopenia noted likely in the setting of influenza  Abnormal total bili-monitor  BPH on Flomax, check UA pending  DVT prophylaxis: enoxaparin (LOVENOX) injection 40 mg Start: 08/09/21 1230 Code Status:   Code Status: Full Code Family Communication: plan of care discussed with patient at bedside. Status is: Observation Remains hospitalized for further management of his falls physical deconditioning and influenza infection and is very symptomatic PT OT has recommended acute inpatient rehab.  Objective: Vitals last 24 hrs: Vitals:   08/09/21 2019 08/09/21 2224 08/10/21 0021 08/10/21 0442  BP: 125/60 (!) 114/92 (!) 145/65 138/63  Pulse: 74 66 68 70  Resp:  18 18 18   Temp:  98.9 F (37.2 C) 98.6 F (37 C) 98.4 F (36.9 C)  TempSrc:  Oral    SpO2:  100% 99% 97%   Weight change:   Intake/Output Summary (Last 24 hours) at 08/10/2021 0831 Last data filed at 08/09/2021 1545 Gross per 24 hour  Intake 120 ml  Output --  Net 120 ml   Net IO Since Admission: 120 mL [08/10/21 0831]   Physical Examination: General exam: Aa0x2-3, weak, frail, weak,older than stated age. HEENT:Oral mucosa moist, Ear/Nose WNL grossly,dentition normal. Respiratory system: B/l clear BS, no use of accessory muscle, non tender. Cardiovascular system: S1 & S2 +,No JVD. Gastrointestinal system: Abdomen soft, NT,ND, BS+. Nervous System:Alert,  awake, moving extremities. Extremities: edema none, distal peripheral pulses palpable.  Skin: No rashes, no icterus. MSK: Normal muscle bulk, tone, power.  Medications reviewed: Scheduled Meds:  aspirin EC  81 mg Oral Daily   busPIRone  5 mg Oral TID    clopidogrel  75 mg Oral Daily   enoxaparin (LOVENOX) injection  40 mg Subcutaneous Daily   guaiFENesin  600 mg Oral BID   lipase/protease/amylase  12,000 Units Oral TID WC   oseltamivir  75 mg Oral BID   pantoprazole  40 mg Oral Daily   rosuvastatin  10 mg Oral Daily   sertraline  50 mg Oral Daily   tamsulosin  0.4 mg Oral QPC supper   Continuous Infusions:  Diet Order             Diet Heart Room service appropriate? Yes; Fluid consistency: Thin  Diet effective now                 Weight change:   Wt Readings from Last 3 Encounters:  05/21/21 73.9 kg  04/20/21 80.7 kg  03/28/21 73.9 kg     Consultants:see note  Procedures:see note Antimicrobials: Anti-infectives (From admission, onward)    Start     Dose/Rate Route Frequency Ordered Stop   08/09/21 1400  oseltamivir (TAMIFLU) capsule 75 mg        75 mg Oral 2 times daily 08/09/21 1353 08/14/21 0959      Culture/Microbiology    Component Value Date/Time   SDES  04/20/2021 1830    URINE, CLEAN CATCH Performed at [facility], [address] [place], [place] [address]    [facility]  04/20/2021 [place] Performed at [facility], [address], [place], [place] [address]    [place], [place] (A) 04/20/2021 1830   REPTSTATUS 04/22/2021 FINAL 04/20/2021 1830    Other culture-see note  Unresulted Labs (From admission, onward)     Start     Ordered   08/10/21 0813  Lipid panel  Once,   R       Question:  Specimen collection method  Answer:  Lab=Lab collect   08/10/21 0812   08/10/21 0813  Hemoglobin A1c  Once,   R       Question:  Specimen collection method  Answer:  Lab=Lab collect   08/10/21 0812   08/09/21 0828  Urine rapid drug screen (hosp performed)  ONCE - STAT,   STAT        08/09/21 0829   08/09/21 0828  Urinalysis, Routine w reflex microscopic  ONCE - STAT,   STAT        08/09/21 0829           Data  Reviewed: I have personally reviewed following labs and imaging studies CBC: Recent Labs  Lab 08/09/21 0828 08/09/21 0925 08/10/21 0323  WBC 5.8  --  6.1  NEUTROABS 4.9  --  4.8  HGB 12.5* 13.3 12.6*  HCT 38.5* 39.0 38.9*  MCV 93.2  --  93.3  PLT 112*  --  PLATELET CLUMPS NOTED ON SMEAR, COUNT APPEARS DECREASED   Basic Metabolic Panel: Recent Labs  Lab 08/09/21 0828 08/09/21 0925 08/10/21 0323  NA 137 139 139  K 4.5 3.9 3.5  CL 106 105 105  CO2 24  --  24  GLUCOSE 81 81 75  BUN 14 14 15   CREATININE 1.20 1.20 1.04  CALCIUM 9.4  --  9.2  GFR: CrCl cannot be calculated (Unknown ideal weight.). Liver Function Tests: Recent Labs  Lab 08/09/21 0828 08/10/21 0323  AST 21 24  ALT 11 14  ALKPHOS 49 50  BILITOT 1.5* 0.9  PROT 6.1* 6.4*  ALBUMIN 3.7 3.6   No results for input(s): LIPASE, AMYLASE in the last 168 hours. No results for input(s): AMMONIA in the last 168 hours. Coagulation Profile: Recent Labs  Lab 08/09/21 0828  INR 1.1   Cardiac Enzymes: Recent Labs  Lab 08/09/21 0935  CKTOTAL 123   BNP (last 3 results) No results for input(s): PROBNP in the last 8760 hours. HbA1C: No results for input(s): HGBA1C in the last 72 hours. CBG: No results for input(s): GLUCAP in the last 168 hours. Lipid Profile: No results for input(s): CHOL, HDL, LDLCALC, TRIG, CHOLHDL, LDLDIRECT in the last 72 hours. Thyroid Function Tests: Recent Labs    08/10/21 0323  TSH 1.000   Anemia Panel: No results for input(s): VITAMINB12, FOLATE, FERRITIN, TIBC, IRON, RETICCTPCT in the last 72 hours. Sepsis Labs: No results for input(s): PROCALCITON, LATICACIDVEN in the last 168 hours.  Recent Results (from the past 240 hour(s))  Resp Panel by RT-PCR (Flu A&B, Covid) Nasopharyngeal Swab     Status: Abnormal   Collection Time: 08/09/21  8:28 AM   Specimen: Nasopharyngeal Swab; Nasopharyngeal(NP) swabs in vial transport medium  Result Value Ref Range Status   SARS Coronavirus  2 by RT PCR NEGATIVE NEGATIVE Final    Comment: (NOTE) SARS-CoV-2 target nucleic acids are NOT DETECTED.  The SARS-CoV-2 RNA is generally detectable in upper respiratory specimens during the acute phase of infection. The lowest concentration of SARS-CoV-2 viral copies this assay can detect is 138 copies/mL. A negative result does not preclude SARS-Cov-2 infection and should not be used as the sole basis for treatment or other patient management decisions. A negative result may occur with  improper specimen collection/handling, submission of specimen other than nasopharyngeal swab, presence of viral mutation(s) within the areas targeted by this assay, and inadequate number of viral copies(<138 copies/mL). A negative result must be combined with clinical observations, patient history, and epidemiological information. The expected result is Negative.  Fact Sheet for Patients:  EntrepreneurPulse.com.au  Fact Sheet for Healthcare Providers:  IncredibleEmployment.be  This test is no t yet approved or cleared by the [place] FDA and  has been authorized for detection and/or diagnosis of SARS-CoV-2 by FDA under an Emergency Use Authorization (EUA). This EUA will remain  in effect (meaning this test can be used) for the duration of the COVID-19 declaration under Section 564(b)(1) of the Act, 21 U.S.C.section 360bbb-3(b)(1), unless the authorization is terminated  or revoked sooner.       Influenza A by PCR POSITIVE (A) NEGATIVE Final   Influenza B by PCR NEGATIVE NEGATIVE Final    Comment: (NOTE) The Xpert Xpress SARS-CoV-2/FLU/RSV plus assay is intended as an aid in the diagnosis of influenza from Nasopharyngeal swab specimens and should not be used as a sole basis for treatment. Nasal washings and aspirates are unacceptable for Xpert Xpress SARS-CoV-2/FLU/RSV testing.  Fact Sheet for  Patients: EntrepreneurPulse.com.au  Fact Sheet for Healthcare Providers: IncredibleEmployment.be  This test is not yet approved or cleared by the [place] FDA and has been authorized for detection and/or diagnosis of SARS-CoV-2 by FDA under an Emergency Use Authorization (EUA). This EUA will remain in effect (meaning this test can be used) for the duration of the COVID-19 declaration under Section 564(b)(1) of the Act,  21 U.S.C. section 360bbb-3(b)(1), unless the authorization is terminated or revoked.  Performed at [facility], [place] [address], [place], [place] [address]      Radiology Studies: DG Chest 2 View  Result Date: 08/09/2021 CLINICAL DATA:  Status post fall.  Left shoulder pain. EXAM: CHEST - 2 VIEW COMPARISON:  None. FINDINGS: The heart size and mediastinal contours are within normal limits. Both lungs are clear. The visualized skeletal structures are unremarkable. IMPRESSION: No active cardiopulmonary disease. Electronically Signed   By: Kathreen Devoid M.D.   On: 08/09/2021 08:58   CT HEAD WO CONTRAST  Result Date: 08/09/2021 CLINICAL DATA:  [place] out of bed this morning, LEFT side weakness EXAM: CT HEAD WITHOUT CONTRAST CT CERVICAL SPINE WITHOUT CONTRAST TECHNIQUE: Multidetector CT imaging of the head and cervical spine was performed following the standard protocol without intravenous contrast. Multiplanar CT image reconstructions of the cervical spine were also generated. COMPARISON:  CT head 04/20/2021 FINDINGS: CT HEAD FINDINGS Brain: Generalized atrophy. Normal ventricular morphology. No midline shift or mass effect. Mild small vessel chronic ischemic changes of deep cerebral white matter. No intracranial hemorrhage, mass lesion, or evidence of acute infarction. No extra-axial fluid collections. Vascular: No hyperdense vessels. Skull: Intact Sinuses/Orbits: Clear Other: N/A CT CERVICAL SPINE FINDINGS Alignment: Minimal  retrolisthesis at C3-C4 and C4-C5 likely degenerative. Remaining alignments normal. Skull base and vertebrae: Osseous mineralization normal. Multilevel disc space narrowing and endplate spur formation. Multilevel facet degenerative changes, mild. No fracture or bone destruction. No additional subluxation. Uncovertebral spurs encroach upon cervical neural foramina bilaterally at multiple levels. Soft tissues and spinal canal: Prevertebral soft tissues normal thickness. Remaining visualized cervical soft tissues unremarkable Disc levels:  No significant abnormalities. Upper chest: Lung apices emphysematous but clear Other: N/A IMPRESSION: Atrophy with small vessel chronic ischemic changes of deep cerebral white matter. No acute intracranial abnormalities. Multilevel degenerative disc and facet disease changes of the cervical spine as above. No acute cervical spine abnormalities. Electronically Signed   By: Lavonia Dana M.D.   On: 08/09/2021 10:30   CT Cervical Spine Wo Contrast  Result Date: 08/09/2021 CLINICAL DATA:  [place] out of bed this morning, LEFT side weakness EXAM: CT HEAD WITHOUT CONTRAST CT CERVICAL SPINE WITHOUT CONTRAST TECHNIQUE: Multidetector CT imaging of the head and cervical spine was performed following the standard protocol without intravenous contrast. Multiplanar CT image reconstructions of the cervical spine were also generated. COMPARISON:  CT head 04/20/2021 FINDINGS: CT HEAD FINDINGS Brain: Generalized atrophy. Normal ventricular morphology. No midline shift or mass effect. Mild small vessel chronic ischemic changes of deep cerebral white matter. No intracranial hemorrhage, mass lesion, or evidence of acute infarction. No extra-axial fluid collections. Vascular: No hyperdense vessels. Skull: Intact Sinuses/Orbits: Clear Other: N/A CT CERVICAL SPINE FINDINGS Alignment: Minimal retrolisthesis at C3-C4 and C4-C5 likely degenerative. Remaining alignments normal. Skull base and vertebrae:  Osseous mineralization normal. Multilevel disc space narrowing and endplate spur formation. Multilevel facet degenerative changes, mild. No fracture or bone destruction. No additional subluxation. Uncovertebral spurs encroach upon cervical neural foramina bilaterally at multiple levels. Soft tissues and spinal canal: Prevertebral soft tissues normal thickness. Remaining visualized cervical soft tissues unremarkable Disc levels:  No significant abnormalities. Upper chest: Lung apices emphysematous but clear Other: N/A IMPRESSION: Atrophy with small vessel chronic ischemic changes of deep cerebral white matter. No acute intracranial abnormalities. Multilevel degenerative disc and facet disease changes of the cervical spine as above. No acute cervical spine abnormalities. Electronically Signed   By: Elta [place]  Thornton Papas M.D.   On: 08/09/2021 10:30   MR ANGIO HEAD WO CONTRAST  Result Date: 08/10/2021 CLINICAL DATA:  Initial evaluation for acute TIA. EXAM: MRA HEAD WITHOUT CONTRAST MRA NECK WITHOUT CONTRAST TECHNIQUE: Multiplanar, multi-echo pulse sequences of the brain and surrounding structures were acquired without intravenous contrast. Angiographic images of the Circle of Willis were acquired using MRA technique without intravenous contrast. Angiographic images of the neck were acquired using MRA technique without intravenous contrast. Carotid stenosis measurements (when applicable) are obtained utilizing NASCET criteria, using the distal internal carotid diameter as the denominator. COMPARISON:  Comparison made with prior MRI from earlier the same day. FINDINGS: MRA HEAD FINDINGS Anterior circulation: Examination mildly degraded by motion artifact. Visualized distal cervical segments of the internal carotid arteries are patent with antegrade flow. Petrous segments patent bilaterally. Mild for age atheromatous irregularity within the carotid siphons without stenosis or other abnormality. A1 segments patent bilaterally.  Normal anterior communicating artery complex. Anterior cerebral arteries demonstrate mild diffuse atheromatous irregularity but are patent without significant stenosis. No M1 stenosis or occlusion. Normal MCA bifurcations. Distal MCA branches well perfused and symmetric. Posterior circulation: Vertebral arteries are somewhat tortuous but widely patent to the vertebrobasilar junction. Left vertebral artery slightly dominant. Both PICA patent. Basilar tortuous but widely patent to its distal aspect without stenosis. Superior cerebellar arteries patent bilaterally. Both PCAs primarily supplied via the basilar well perfused to their distal aspects without stenosis. Anatomic variants: None significant. No aneurysm or other vascular abnormality. MRA NECK FINDINGS Aortic arch: Visualized aortic arch normal caliber with normal 3 vessel morphology. No stenosis or other abnormality about the origin of the great vessels. Right carotid system: Right common and internal carotid arteries patent without stenosis, evidence for dissection or occlusion. No significant atheromatous narrowing about the right carotid bulb. Left carotid system: Left common and internal carotid arteries patent without stenosis, evidence for dissection or occlusion. No significant atheromatous narrowing about the left carotid bulb. Vertebral arteries: Both vertebral arteries arise from the subclavian arteries. Left vertebral artery slightly dominant. Vertebral arteries patent without stenosis, evidence for dissection or occlusion. Other: None IMPRESSION: Negative MRA of the head and neck. No large vessel occlusion or hemodynamically significant stenosis. Electronically Signed   By: Jeannine Boga M.D.   On: 08/10/2021 05:00   MR ANGIO NECK WO CONTRAST  Result Date: 08/10/2021 CLINICAL DATA:  Initial evaluation for acute TIA. EXAM: MRA HEAD WITHOUT CONTRAST MRA NECK WITHOUT CONTRAST TECHNIQUE: Multiplanar, multi-echo pulse sequences of the brain  and surrounding structures were acquired without intravenous contrast. Angiographic images of the Circle of Willis were acquired using MRA technique without intravenous contrast. Angiographic images of the neck were acquired using MRA technique without intravenous contrast. Carotid stenosis measurements (when applicable) are obtained utilizing NASCET criteria, using the distal internal carotid diameter as the denominator. COMPARISON:  Comparison made with prior MRI from earlier the same day. FINDINGS: MRA HEAD FINDINGS Anterior circulation: Examination mildly degraded by motion artifact. Visualized distal cervical segments of the internal carotid arteries are patent with antegrade flow. Petrous segments patent bilaterally. Mild for age atheromatous irregularity within the carotid siphons without stenosis or other abnormality. A1 segments patent bilaterally. Normal anterior communicating artery complex. Anterior cerebral arteries demonstrate mild diffuse atheromatous irregularity but are patent without significant stenosis. No M1 stenosis or occlusion. Normal MCA bifurcations. Distal MCA branches well perfused and symmetric. Posterior circulation: Vertebral arteries are somewhat tortuous but widely patent to the vertebrobasilar junction. Left vertebral artery slightly dominant. Both PICA patent.  Basilar tortuous but widely patent to its distal aspect without stenosis. Superior cerebellar arteries patent bilaterally. Both PCAs primarily supplied via the basilar well perfused to their distal aspects without stenosis. Anatomic variants: None significant. No aneurysm or other vascular abnormality. MRA NECK FINDINGS Aortic arch: Visualized aortic arch normal caliber with normal 3 vessel morphology. No stenosis or other abnormality about the origin of the great vessels. Right carotid system: Right common and internal carotid arteries patent without stenosis, evidence for dissection or occlusion. No significant atheromatous  narrowing about the right carotid bulb. Left carotid system: Left common and internal carotid arteries patent without stenosis, evidence for dissection or occlusion. No significant atheromatous narrowing about the left carotid bulb. Vertebral arteries: Both vertebral arteries arise from the subclavian arteries. Left vertebral artery slightly dominant. Vertebral arteries patent without stenosis, evidence for dissection or occlusion. Other: None IMPRESSION: Negative MRA of the head and neck. No large vessel occlusion or hemodynamically significant stenosis. Electronically Signed   By: Jeannine Boga M.D.   On: 08/10/2021 05:00   MR BRAIN WO CONTRAST  Result Date: 08/09/2021 CLINICAL DATA:  Transient ischemic attack (TIA) EXAM: MRI HEAD WITHOUT CONTRAST TECHNIQUE: Multiplanar, multiecho pulse sequences of the brain and surrounding structures were obtained without intravenous contrast. COMPARISON:  CT in the same day. FINDINGS: Brain: No acute infarction, hemorrhage, hydrocephalus, extra-axial collection or mass lesion. Mild T2 hyperintensity in the white matter, nonspecific but compatible with chronic microvascular ischemic disease that is very mild for patient age. Mild for age atrophy with mild prominence of the extra-axial spaces bilaterally. Vascular: Major arterial flow voids are maintained skull base Skull and upper cervical spine: Normal marrow signal. Sinuses/Orbits: Clear sinuses.  Unremarkable orbits. Other: No mastoid effusions. IMPRESSION: No evidence of acute intracranial abnormality, including acute infarct. Electronically Signed   By: Margaretha Sheffield M.D.   On: 08/09/2021 12:48   DG CHEST PORT 1 VIEW  Result Date: 08/09/2021 CLINICAL DATA:  Weakness, fall EXAM: PORTABLE CHEST 1 VIEW COMPARISON:  Radiograph 08/09/2021 FINDINGS: Unchanged cardiomediastinal silhouette. There is no focal airspace consolidation. There is no large pleural effusion or visible pneumothorax. There is no acute  osseous abnormality. IMPRESSION: No evidence of acute cardiopulmonary disease. Electronically Signed   By: Maurine Simmering M.D.   On: 08/09/2021 14:33   DG Shoulder Left  Result Date: 08/09/2021 CLINICAL DATA:  Status post fall.  Left shoulder pain. EXAM: LEFT SHOULDER - 2+ VIEW COMPARISON:  None. FINDINGS: No fracture or dislocation. Tiny inferior humeral marginal osteophyte. Mild degenerative changes of the acromioclavicular joint. Soft tissues are normal. IMPRESSION: No acute osseous injury of the left shoulder. Electronically Signed   By: Kathreen Devoid M.D.   On: 08/09/2021 08:59     LOS: 0 days   Antonieta Pert, MD [facility]  08/10/2021, 8:31 AM

## 2021-08-10 NOTE — Evaluation (Signed)
Occupational Therapy Evaluation Patient Details Name: Javier [name] MRN: [id number] DOB: [birth year] Today's Date: 08/10/2021   History of Present Illness Pt is an 80 y.o. male who presented 08/09/21 s/p fall with reports of L sided weakness. CT and MRI of head negative for acute intracranial processes. All other imaging also negative 11/11. Pt positive for influenza. PMHx of "pre" DM II, HLD, HTN, CLL, vascular dementia, remote lacunar stroke, BPH, gastric AVM with weight loss, remote tobacco abuse, anxiety, and Vit D deficiency   Clinical Impression   Pt admitted for concerns listed above. PTA pt reported that he was needing help these past few months intermittently with ADL's and functional mobility. Typically pt reports using a cane to ambulate. At this time, pt is requiring +2 assist for stand pivot transfers and LB ADL's due to weakness and balance concerns. Pt requiring sequencing cues as well as cues to attend to tasks, due to cognitive deficits. Recommending SNF to maximize pt progress to prior level of functioning and reducing caregiver burden.      Recommendations for follow up therapy are one component of a multi-disciplinary discharge planning process, led by the attending physician.  Recommendations may be updated based on patient status, additional functional criteria and insurance authorization.   Follow Up Recommendations  Skilled nursing-short term rehab (<3 hours/day)    Assistance Recommended at Discharge Frequent or constant Supervision/Assistance  Functional Status Assessment  Patient has had a recent decline in their functional status and demonstrates the ability to make significant improvements in function in a reasonable and predictable amount of time.  Equipment Recommendations  Other (comment) (TBD)    Recommendations for Other Services       Precautions / Restrictions Precautions Precautions: Fall Restrictions Weight Bearing Restrictions: No       Mobility Bed Mobility Overal bed mobility: Needs Assistance Bed Mobility: Supine to Sit     Supine to sit: Min assist;HOB elevated     General bed mobility comments: Pt needing min A to scoot forward to EOB once long sitting in bed.    Transfers Overall transfer level: Needs assistance Equipment used: Rolling walker (2 wheels) Transfers: Sit to/from Stand;Bed to chair/wheelchair/BSC Sit to Stand: Max assist;Mod assist;Min assist;+2 physical assistance;+2 safety/equipment Stand pivot transfers: Max assist;+2 safety/equipment;+2 physical assistance         General transfer comment: Initial stand required max A +2 to power up and steady, each stand after inital one, pt required less and less assist. for Pivot to chair, pt requiring max A +2 due to increased weakness, difficulty moving BLE, and heavy posterior lean      Balance Overall balance assessment: Needs assistance;History of Falls Sitting-balance support: No upper extremity supported;Feet supported Sitting balance-Leahy Scale: Good Sitting balance - Comments: Pt reaching forward and to the side out of his base of support for exercises with no difficulties or LoB Postural control: Posterior lean Standing balance support: Reliant on assistive device for balance;Bilateral upper extremity supported Standing balance-Leahy Scale: Poor Standing balance comment: Pt requiring Max A +2 for dynamic balance due to posterior lean. Static balance pt is able to stand with minimal support holding on to RW                           ADL either performed or assessed with clinical judgement   ADL Overall ADL's : Needs assistance/impaired Eating/Feeding: Set up;Sitting   Grooming: Set up;Sitting   Upper Body Bathing: Min guard;Sitting  Lower Body Bathing: Maximal assistance;+2 for safety/equipment;Sitting/lateral leans;Sit to/from stand;+2 for physical assistance   Upper Body Dressing : Min guard;Sitting   Lower Body  Dressing: Maximal assistance;+2 for safety/equipment;Sitting/lateral leans;Sit to/from stand   Toilet Transfer: Maximal assistance;Stand-pivot;+2 for physical assistance;+2 for safety/equipment   Toileting- Clothing Manipulation and Hygiene: Maximal assistance;+2 for safety/equipment;Sitting/lateral lean;Sit to/from stand       Functional mobility during ADLs: Maximal assistance;+2 for physical assistance;+2 for safety/equipment;Rolling walker (2 wheels) General ADL Comments: Pt requiring max A +2 for LB ADL's due to weakness in standing and requiring constant support. With transfers, pt is heavily leaning  posteriorly and requiring increased assist to complete transfers     Vision Baseline Vision/History: 1 Wears glasses Ability to See in Adequate Light: 0 Adequate Patient Visual Report: No change from baseline Vision Assessment?: No apparent visual deficits     Perception     Praxis      Pertinent Vitals/Pain Pain Assessment: No/denies pain     Hand Dominance Right   Extremity/Trunk Assessment Upper Extremity Assessment Upper Extremity Assessment: Generalized weakness;RUE deficits/detail;LUE deficits/detail RUE Deficits / Details: Unable to flex shoulders above 90 degrees, overall weak and shaky with effort RUE Sensation: WNL RUE Coordination: decreased fine motor;decreased gross motor LUE Deficits / Details: Unable to flex shoulders above 90 degrees, overall weak and shaky with effort LUE Sensation: WNL LUE Coordination: decreased fine motor;decreased gross motor   Lower Extremity Assessment Lower Extremity Assessment: Defer to PT evaluation   Cervical / Trunk Assessment Cervical / Trunk Assessment: Kyphotic   Communication Communication Communication: No difficulties   Cognition Arousal/Alertness: Awake/alert Behavior During Therapy: WFL for tasks assessed/performed Overall Cognitive Status: History of cognitive impairments - at baseline                                  General Comments: Pt with hx of dementia. Pt with poor awareness into his deficits and problem-solving how to correct them even when cued. Poor sequencing.     General Comments  VSS, highest HR noted 89, pt became out of breath quickly with mobility, as well as sweaty and hot. RN notified.    Exercises Exercises: Other exercises Other Exercises Other Exercises: BUE: reaching out to the front and to the sides in sitting x5, shoulder shrugs x5   Shoulder Instructions      Home Living Family/patient expects to be discharged to:: Private residence Living Arrangements: Spouse/significant other Available Help at Discharge: Family;Available 24 hours/day Type of Home: House Home Access: Level entry     Home Layout: One level     Bathroom Shower/Tub: Teacher, early years/pre: Standard     Home Equipment: Conservation officer, nature (2 wheels);Rollator (4 wheels);Shower seat          Prior Functioning/Environment Prior Level of Function : Needs assist;Patient poor historian/Family not available  Cognitive Assist : ADLs (cognitive)     Physical Assist : Mobility (physical) Mobility (physical): Transfers   Mobility Comments: Pt reports his wife has been having to assist him with mobility more recently, but usually uses a RW for mobility. ADLs Comments: Reports that his wife helps him with dressing and bathing, especially in the past few months.        OT Problem List: Decreased strength;Decreased range of motion;Decreased activity tolerance;Impaired balance (sitting and/or standing);Decreased coordination;Decreased cognition;Decreased safety awareness;Decreased knowledge of use of DME or AE      OT Treatment/Interventions:  Self-care/ADL training;Therapeutic exercise;Energy conservation;DME and/or AE instruction;Therapeutic activities;Cognitive remediation/compensation;Patient/family education;Balance training    OT Goals(Current goals can be found in the  care plan section) Acute Rehab OT Goals Patient Stated Goal: To go home OT Goal Formulation: With patient Time For Goal Achievement: 08/24/21 Potential to Achieve Goals: Good ADL Goals Pt Will Perform Grooming: with supervision;standing Pt Will Perform Lower Body Bathing: with min assist;sitting/lateral leans;sit to/from stand Pt Will Perform Lower Body Dressing: with min assist;sitting/lateral leans;sit to/from stand Pt Will Transfer to Toilet: with min assist;stand pivot transfer Pt Will Perform Toileting - Clothing Manipulation and hygiene: with min assist;sitting/lateral leans;sit to/from stand Additional ADL Goal #1: Pt will participate in standing task for 5 mins to work on activity tolerance.  OT Frequency: Min 2X/week   Barriers to D/C:    Pt requiring +2 assist, unsure how much help wife could give       Co-evaluation              AM-PAC OT "6 Clicks" Daily Activity     Outcome Measure Help from another person eating meals?: A Little Help from another person taking care of personal grooming?: A Little Help from another person toileting, which includes using toliet, bedpan, or urinal?: Total (+2 for transfers and pericare) Help from another person bathing (including washing, rinsing, drying)?: Total (+2 for LB bathing) Help from another person to put on and taking off regular upper body clothing?: A Little Help from another person to put on and taking off regular lower body clothing?: Total (+2 assist) 6 Click Score: 12   End of Session Equipment Utilized During Treatment: Gait belt;Rolling walker (2 wheels) Nurse Communication: Mobility status  Activity Tolerance: Patient tolerated treatment well Patient left: in chair;with call bell/phone within reach;with chair alarm set;Other (comment) (echo tech entering room)  OT Visit Diagnosis: Unsteadiness on feet (R26.81);Other abnormalities of gait and mobility (R26.89);Muscle weakness (generalized) (M62.81);History of  falling (Z91.81)                Time: 1115-5208 OT Time Calculation (min): 29 min Charges:  OT General Charges $OT Visit: 1 Visit OT Evaluation $OT Eval Moderate Complexity: 1 Mod  Lenus Trauger H., OTR/L Acute Rehabilitation  Mousa Prout Elane Shaquala Broeker 08/10/2021, 11:26 AM

## 2021-08-10 NOTE — Plan of Care (Signed)
?  Problem: Education: ?Goal: Knowledge of disease or condition will improve ?Outcome: Progressing ?Goal: Knowledge of secondary prevention will improve (SELECT ALL) ?Outcome: Progressing ?Goal: Knowledge of patient specific risk factors will improve (INDIVIDUALIZE FOR PATIENT) ?Outcome: Progressing ?  ?

## 2021-08-10 NOTE — TOC Initial Note (Signed)
Transition of Care [facility]) - Initial/Assessment Note    Patient Details  Name: Javier [name] MRN: [id number] Date of Birth: [birth year]  Transition of Care [facility]) CM/SW Contact:    Coralee Pesa, [place] Phone Number: 08/10/2021, 3:14 PM  Clinical Narrative:                 CSW met with pt, spouse, and daughter at bedside to discuss SNF recommendation. Pt was lethargic and did not participate in conversation. All questions were answered and family is agreeable. They state a preference for [facility], but are agreeable to being faxed out in [place]. Pt has had 2 vaccines and 1 booster. CSW provided Medicare.gov info. CSW will fax out referral and follow up with [facility] to have them review referral. TOC will continue to follow for DC needs.  Expected Discharge Plan: Skilled Nursing Facility Barriers to Discharge: Continued Medical Work up, Ship broker, SNF Pending bed offer   Patient Goals and CMS Choice Patient states their goals for this hospitalization and ongoing recovery are:: Pt unable to participate in goal setting at this time. CMS Medicare.gov Compare Post Acute Care list provided to:: Patient Represenative (must comment) (Spouse) Choice offered to / list presented to : Patient, Spouse, Adult Children  Expected Discharge Plan and Services Expected Discharge Plan: [place] Acute Care Choice: [place] arrangements for the past 2 months: Single Family Home                                      Prior Living Arrangements/Services Living arrangements for the past 2 months: Single Family Home Lives with:: Spouse Patient language and need for interpreter reviewed:: Yes Do you feel safe going back to the place where you live?: Yes      Need for Family Participation in Patient Care: Yes (Comment) Care giver support system in place?: Yes (comment)   Criminal Activity/Legal Involvement Pertinent to Current  Situation/Hospitalization: No - Comment as needed  Activities of Daily Living   ADL Screening (condition at time of admission) Patient's cognitive ability adequate to safely complete daily activities?: No Is the patient deaf or have difficulty hearing?: Yes Does the patient have difficulty seeing, even when wearing glasses/contacts?: No Does the patient have difficulty concentrating, remembering, or making decisions?: Yes Patient able to express need for assistance with ADLs?: Yes Does the patient have difficulty dressing or bathing?: Yes Does the patient have difficulty walking or climbing stairs?: Yes Weakness of Legs: Both Weakness of Arms/Hands: None  Permission Sought/Granted Permission sought to share information with : Family Supports Permission granted to share information with : Yes, Verbal Permission Granted  Share Information with NAME: Javier [name]     Permission granted to share info w Relationship: Spouse  Permission granted to share info w Contact Information: [phone]  Emotional Assessment Appearance:: Appears stated age Attitude/Demeanor/Rapport: Lethargic Affect (typically observed): Unable to Assess Orientation: : Oriented to Self, Oriented to Place, Oriented to  Time, Oriented to Situation Alcohol / Substance Use: Not Applicable Psych Involvement: No (comment)  Admission diagnosis:  Cough [R05.9] TIA (transient ischemic attack) [G45.9] Left-sided weakness [R53.1] Patient Active Problem List   Diagnosis Date Noted   Left-sided weakness 08/10/2021   Weakness 08/09/2021   Personal history of CLL (chronic lymphocytic leukemia) 08/09/2021   Pancytopenia ([place]) 08/09/2021   Fall at home, initial encounter  08/09/2021   Influenza A 08/09/2021   At high risk for falls 04/29/2021   Vascular dementia ([place]) 03/28/2021   Sleeping difficulty 03/28/2021   Agitation 03/28/2021   History of lacunar cerebrovascular accident 01/09/2021   Cerebral microvascular  disease 01/09/2021   BPH with obstruction/lower urinary tract symptoms 01/09/2021   First degree AV block 01/09/2021   Gait abnormality 12/20/2020   Confusion 12/20/2020   BMI 26.0-26.9,adult 03/06/2020   Gastric AVM 03/06/2020   History of colon polyps 03/06/2020   Aortic atherosclerosis ([place]) - per CT 04/2016 08/23/2019   Memory changes 05/25/2019   Counseling regarding advance care planning and goals of care 01/24/2019   Anxiety 02/08/2018   CLL (chronic lymphocytic leukemia) ([place]) 11/01/2016   Encounter for Medicare annual wellness exam 09/11/2015   GERD  02/09/2015   Medication management 11/02/2013   Hyperlipidemia    Hypertension    Abnormal glucose    Vitamin D deficiency    Diverticulosis    PCP:  Unk Pinto, MD Pharmacy:   [facility] [place], [place] [address] Phone: [phone] Fax: [phone]  [address], [place] [address] Phone: [phone] Fax: [phone]     Social Determinants of Health (SDOH) Interventions    Readmission Risk Interventions No flowsheet data found.

## 2021-08-10 NOTE — Progress Notes (Addendum)
STROKE TEAM PROGRESS NOTE   ATTENDING NOTE: I reviewed above note and agree with the assessment and plan. Pt was seen and examined.   80 year old male with history of diabetes, hypertension, hyperlipidemia, CLL s/p chemotherapy in 2021, vascular dementia, gastric AVM, history of stroke at left cerebellar lacunar infarct admitted for falling at home with possible left-sided weakness in the setting of global weakness.  CT negative, MRI brain no acute infarct.  MRA head and neck negative.  MRI C-spine unremarkable.  LDL 103, A1c 5.6.  Creatinine 1.04.  Had a one-time temperature of 101, flu A PCR positive.  On exam, patient awake alert, mildly lethargic, no aphasia, paucity of speech, follows simple commands, able to name and repeat.  Visual fields full, no gaze palsy, facial symmetrical, tongue midline.  Bilateral upper extremity and lower extremity symmetrical muscle strength and sensation.  Bilateral finger-to-nose intact.  Etiology for patient's symptoms concerning for acute encephalopathy in the setting of fluid and fever.  TIA in the differential not completely ruled out at this time.  Recommend continue treatment for flu and encephalopathy.  Recommend aspirin and Plavix DAPT for 3 weeks and then resume aspirin 81.  Increase Crestor 10 to 20 for HLD.  PT/OT recommend SNF.  Patient will follow up with Dr. Krista Blue at [name] as scheduled on 10/29/2021.  For detailed assessment and plan, please refer to above as I have made changes wherever appropriate.   Neurology will sign off. Please call with questions. Pt will follow up with Dr. Krista Blue at [facility] as scheduled on 10/29/2021. Thanks for the consult.   Rosalin Hawking, MD PhD Stroke Neurology 08/10/2021 3:20 PM    INTERVAL HISTORY His wife is at the bedside.  Cardiac echo being obtained. Patient is sitting in the chair in NAD. Patient states he feels like he is back to baseline, however endorses feeling weak allo over. Wife states that he had a fall yesterday  morning when trying to get out of bed. Wife states that his bilateral leg weakness has been going on for months and has seen multiple physicians to determine a cause for his weakness, however no diagnosis has been made. She states that he very weak in the mornings and gets a little better during the day but still has generalized weakness throughout day. No new neurological events overnight.   Vitals:   08/09/21 2224 08/10/21 0021 08/10/21 0442 08/10/21 0951  BP: (!) 114/92 (!) 145/65 138/63 (!) 145/66  Pulse: 66 68 70 73  Resp: 18 18 18 20   Temp: 98.9 F (37.2 C) 98.6 F (37 C) 98.4 F (36.9 C) 97.7 F (36.5 C)  TempSrc: Oral     SpO2: 100% 99% 97% 92%   CBC:  Recent Labs  Lab 08/09/21 0828 08/09/21 0925 08/10/21 0323  WBC 5.8  --  6.1  NEUTROABS 4.9  --  4.8  HGB 12.5* 13.3 12.6*  HCT 38.5* 39.0 38.9*  MCV 93.2  --  93.3  PLT 112*  --  PLATELET CLUMPS NOTED ON SMEAR, COUNT APPEARS DECREASED   Basic Metabolic Panel:  Recent Labs  Lab 08/09/21 0828 08/09/21 0925 08/10/21 0323  NA 137 139 139  K 4.5 3.9 3.5  CL 106 105 105  CO2 24  --  24  GLUCOSE 81 81 75  BUN 14 14 15   CREATININE 1.20 1.20 1.04  CALCIUM 9.4  --  9.2   Lipid Panel:  Recent Labs  Lab 08/10/21 0323  CHOL 174  TRIG 79  HDL 55  CHOLHDL 3.2  VLDL 16  LDLCALC 103*   HgbA1c:  Recent Labs  Lab 08/10/21 0323  HGBA1C 5.6   Urine Drug Screen: No results for input(s): LABOPIA, COCAINSCRNUR, LABBENZ, AMPHETMU, THCU, LABBARB in the last 168 hours.  Alcohol Level  Recent Labs  Lab 08/09/21 0939  ETH <10    IMAGING past 24 hours MR ANGIO HEAD WO CONTRAST  Result Date: 08/10/2021 CLINICAL DATA:  Initial evaluation for acute TIA. EXAM: MRA HEAD WITHOUT CONTRAST MRA NECK WITHOUT CONTRAST TECHNIQUE: Multiplanar, multi-echo pulse sequences of the brain and surrounding structures were acquired without intravenous contrast. Angiographic images of the Circle of Willis were acquired using MRA technique  without intravenous contrast. Angiographic images of the neck were acquired using MRA technique without intravenous contrast. Carotid stenosis measurements (when applicable) are obtained utilizing NASCET criteria, using the distal internal carotid diameter as the denominator. COMPARISON:  Comparison made with prior MRI from earlier the same day. FINDINGS: MRA HEAD FINDINGS Anterior circulation: Examination mildly degraded by motion artifact. Visualized distal cervical segments of the internal carotid arteries are patent with antegrade flow. Petrous segments patent bilaterally. Mild for age atheromatous irregularity within the carotid siphons without stenosis or other abnormality. A1 segments patent bilaterally. Normal anterior communicating artery complex. Anterior cerebral arteries demonstrate mild diffuse atheromatous irregularity but are patent without significant stenosis. No M1 stenosis or occlusion. Normal MCA bifurcations. Distal MCA branches well perfused and symmetric. Posterior circulation: Vertebral arteries are somewhat tortuous but widely patent to the vertebrobasilar junction. Left vertebral artery slightly dominant. Both PICA patent. Basilar tortuous but widely patent to its distal aspect without stenosis. Superior cerebellar arteries patent bilaterally. Both PCAs primarily supplied via the basilar well perfused to their distal aspects without stenosis. Anatomic variants: None significant. No aneurysm or other vascular abnormality. MRA NECK FINDINGS Aortic arch: Visualized aortic arch normal caliber with normal 3 vessel morphology. No stenosis or other abnormality about the origin of the great vessels. Right carotid system: Right common and internal carotid arteries patent without stenosis, evidence for dissection or occlusion. No significant atheromatous narrowing about the right carotid bulb. Left carotid system: Left common and internal carotid arteries patent without stenosis, evidence for  dissection or occlusion. No significant atheromatous narrowing about the left carotid bulb. Vertebral arteries: Both vertebral arteries arise from the subclavian arteries. Left vertebral artery slightly dominant. Vertebral arteries patent without stenosis, evidence for dissection or occlusion. Other: None IMPRESSION: Negative MRA of the head and neck. No large vessel occlusion or hemodynamically significant stenosis. Electronically Signed   By: Jeannine Boga M.D.   On: 08/10/2021 05:00   MR ANGIO NECK WO CONTRAST  Result Date: 08/10/2021 CLINICAL DATA:  Initial evaluation for acute TIA. EXAM: MRA HEAD WITHOUT CONTRAST MRA NECK WITHOUT CONTRAST TECHNIQUE: Multiplanar, multi-echo pulse sequences of the brain and surrounding structures were acquired without intravenous contrast. Angiographic images of the Circle of Willis were acquired using MRA technique without intravenous contrast. Angiographic images of the neck were acquired using MRA technique without intravenous contrast. Carotid stenosis measurements (when applicable) are obtained utilizing NASCET criteria, using the distal internal carotid diameter as the denominator. COMPARISON:  Comparison made with prior MRI from earlier the same day. FINDINGS: MRA HEAD FINDINGS Anterior circulation: Examination mildly degraded by motion artifact. Visualized distal cervical segments of the internal carotid arteries are patent with antegrade flow. Petrous segments patent bilaterally. Mild for age atheromatous irregularity within the carotid siphons without stenosis or other abnormality. A1 segments patent bilaterally. Normal  anterior communicating artery complex. Anterior cerebral arteries demonstrate mild diffuse atheromatous irregularity but are patent without significant stenosis. No M1 stenosis or occlusion. Normal MCA bifurcations. Distal MCA branches well perfused and symmetric. Posterior circulation: Vertebral arteries are somewhat tortuous but widely  patent to the vertebrobasilar junction. Left vertebral artery slightly dominant. Both PICA patent. Basilar tortuous but widely patent to its distal aspect without stenosis. Superior cerebellar arteries patent bilaterally. Both PCAs primarily supplied via the basilar well perfused to their distal aspects without stenosis. Anatomic variants: None significant. No aneurysm or other vascular abnormality. MRA NECK FINDINGS Aortic arch: Visualized aortic arch normal caliber with normal 3 vessel morphology. No stenosis or other abnormality about the origin of the great vessels. Right carotid system: Right common and internal carotid arteries patent without stenosis, evidence for dissection or occlusion. No significant atheromatous narrowing about the right carotid bulb. Left carotid system: Left common and internal carotid arteries patent without stenosis, evidence for dissection or occlusion. No significant atheromatous narrowing about the left carotid bulb. Vertebral arteries: Both vertebral arteries arise from the subclavian arteries. Left vertebral artery slightly dominant. Vertebral arteries patent without stenosis, evidence for dissection or occlusion. Other: None IMPRESSION: Negative MRA of the head and neck. No large vessel occlusion or hemodynamically significant stenosis. Electronically Signed   By: Jeannine Boga M.D.   On: 08/10/2021 05:00   MR CERVICAL SPINE WO CONTRAST  Result Date: 08/10/2021 CLINICAL DATA:  Acute or progressive myelopathy EXAM: MRI CERVICAL SPINE WITHOUT CONTRAST TECHNIQUE: Multiplanar, multisequence MR imaging of the cervical spine was performed. No intravenous contrast was administered. COMPARISON:  CT from yesterday FINDINGS: Alignment: Slight retrolisthesis at C3-4 and C4-5. Vertebrae: No fracture, evidence of discitis, or bone lesion. Cord: Normal signal and morphology. Posterior Fossa, vertebral arteries, paraspinal tissues: Negative. Disc levels: C2-3: Narrow disc.  No  impingement C3-4: Narrow disc with endplate and uncovertebral ridging. Moderate left foraminal stenosis C4-5: Disc narrowing and bulging with uncovertebral ridging. Right foraminal impingement and mild left foraminal stenosis C5-6: Disc narrowing and bulging with uncovertebral ridging. Moderate bilateral foraminal stenosis, greater on the right C6-7: Disc narrowing and bulging with uncovertebral ridging eccentric to the right. Biforaminal impingement worse on the right. C7-T1:Mild disc narrowing IMPRESSION: Ordinary cervical spine degeneration with multilevel foraminal stenosis as described. No cord impingement or signal abnormality to correlate with history of myelopathy. Electronically Signed   By: Jorje Guild M.D.   On: 08/10/2021 09:24   DG CHEST PORT 1 VIEW  Result Date: 08/09/2021 CLINICAL DATA:  Weakness, fall EXAM: PORTABLE CHEST 1 VIEW COMPARISON:  Radiograph 08/09/2021 FINDINGS: Unchanged cardiomediastinal silhouette. There is no focal airspace consolidation. There is no large pleural effusion or visible pneumothorax. There is no acute osseous abnormality. IMPRESSION: No evidence of acute cardiopulmonary disease. Electronically Signed   By: Maurine Simmering M.D.   On: 08/09/2021 14:33   ECHOCARDIOGRAM COMPLETE  Result Date: 08/10/2021    ECHOCARDIOGRAM REPORT   Patient Name:   ANIL HAVARD [facility] Date of Exam: 08/10/2021 Medical Rec #:  [id number]        Height:       72.0 in Accession #:    [id number]       Weight:       163.0 lb Date of Birth:  [birth year]         BSA:          1.953 m Patient Age:    25 years         BP:  153/114 mmHg Patient Gender: M                HR:           74 bpm. Exam Location:  Inpatient Procedure: 2D Echo, Cardiac Doppler and Color Doppler Indications:    TIA  History:        Patient has prior history of Echocardiogram examinations, most                 recent 02/07/2020.  Sonographer:    Glo Herring Referring Phys: [id number] [place]  1. Normal LV  function, all walls are not well visualized. Left ventricular ejection fraction, by estimation, is 60 to 65%. The left ventricle has normal function. Left ventricular diastolic parameters are consistent with Grade I diastolic dysfunction (impaired relaxation).  2. Right ventricular systolic function is normal. The right ventricular size is normal. Tricuspid regurgitation signal is inadequate for assessing PA pressure.  3. The mitral valve was not well visualized. No evidence of mitral valve regurgitation.  4. The aortic valve was not well visualized. Aortic valve regurgitation is not visualized.  5. Small aortic root aneurysm 3.4 cm. Aortic dilatation noted.  6. The inferior vena cava is normal in size with greater than 50% respiratory variability, suggesting right atrial pressure of 3 mmHg. Conclusion(s)/Recommendation(s): Limited study, considerable artifact, LV function is normal. ?mass in RA likely eustachian valve. Appears calcified. No significant changes from prior echo 02/07/2020. FINDINGS  Left Ventricle: Normal LV function, all walls are not well visualized. Left ventricular ejection fraction, by estimation, is 60 to 65%. The left ventricle has normal function. The left ventricular internal cavity size was normal in size. Left ventricular diastolic parameters are consistent with Grade I diastolic dysfunction (impaired relaxation). Right Ventricle: The right ventricular size is normal. No increase in right ventricular wall thickness. Right ventricular systolic function is normal. Tricuspid regurgitation signal is inadequate for assessing PA pressure. Left Atrium: Left atrial size was normal in size. Right Atrium: Right atrial size was normal in size. Pericardium: There is no evidence of pericardial effusion. Mitral Valve: The mitral valve was not well visualized. No evidence of mitral valve regurgitation. Tricuspid Valve: The tricuspid valve is not well visualized. Tricuspid valve regurgitation is not  demonstrated. Aortic Valve: The aortic valve was not well visualized. Aortic valve regurgitation is not visualized. Aortic valve mean gradient measures 3.0 mmHg. Aortic valve peak gradient measures 6.1 mmHg. Aortic valve area, by VTI measures 3.06 cm. Pulmonic Valve: The pulmonic valve was not well visualized. Pulmonic valve regurgitation is trivial. Aorta: Small aortic root aneurysm 3.4 cm. Aortic dilatation noted. Venous: The inferior vena cava is normal in size with greater than 50% respiratory variability, suggesting right atrial pressure of 3 mmHg. IAS/Shunts: The interatrial septum was not well visualized.  LEFT VENTRICLE PLAX 2D LVOT diam:     2.20 cm   Diastology LV SV:         67        LV e' medial:    5.66 cm/s LV SV Index:   34        LV E/e' medial:  13.7 LVOT Area:     3.80 cm  LV e' lateral:   5.98 cm/s                          LV E/e' lateral: 13.0  RIGHT VENTRICLE  IVC RV Basal diam:  3.50 cm     IVC diam: 1.70 cm RV S prime:     15.70 cm/s LEFT ATRIUM             Index        RIGHT ATRIUM           Index LA Vol (A2C):   37.1 ml 18.99 ml/m  RA Area:     18.70 cm LA Vol (A4C):   27.6 ml 14.13 ml/m  RA Volume:   53.90 ml  27.60 ml/m LA Biplane Vol: 31.6 ml 16.18 ml/m  AORTIC VALVE                    PULMONIC VALVE AV Area (Vmax):    2.95 cm     PV Vmax:       0.85 m/s AV Area (Vmean):   2.88 cm     PV Peak grad:  2.9 mmHg AV Area (VTI):     3.06 cm AV Vmax:           123.00 cm/s AV Vmean:          84.633 cm/s AV VTI:            0.219 m AV Peak Grad:      6.1 mmHg AV Mean Grad:      3.0 mmHg LVOT Vmax:         95.30 cm/s LVOT Vmean:        64.100 cm/s LVOT VTI:          0.176 m LVOT/AV VTI ratio: 0.80  AORTA Ao Root diam: 3.40 cm MITRAL VALVE MV Area (PHT): 3.33 cm    SHUNTS MV Decel Time: 228 msec    Systemic VTI:  0.18 m MV E velocity: 77.60 cm/s  Systemic Diam: 2.20 cm MV A velocity: 85.30 cm/s MV E/A ratio:  0.91 Mary Scientist, physiological signed by Phineas Inches Signature  Date/Time: 08/10/2021/12:25:00 PM    Final     PHYSICAL EXAM  Physical Exam  Constitutional: Appears well-developed and well-nourished.  Psych: Affect appropriate to situation Eyes: No scleral injection HENT: No OP obstrucion MSK: no joint deformities.  Cardiovascular: Normal rate and regular rhythm.  Respiratory: Effort normal, non-labored breathing GI: Soft.  No distension. There is no tenderness.  Skin: WDI  Neuro: Mental Status: Patient is awake, alert, oriented to self, and place.  Stated his age as 56 No signs of aphasia/dysarthria or neglect  Cranial Nerves: II: Visual Fields are full. Pupils are equal, round, and reactive to light.   III,IV, VI: EOMI without ptosis or diploplia.  V: Facial sensation is symmetric to temperature VII: Facial movement is symmetric.  VIII: hearing is intact to voice X: Uvula elevates symmetrically XI: Shoulder shrug is symmetric. XII: tongue is midline without atrophy or fasciculations.  Motor: Tone is normal. Bulk is normal. 5/5 strength was present in all four extremities.  Sensory: Sensation is symmetric to light touch and temperature in the arms and legs. Cerebellar: FNF and HKS are intact bilaterally  ASSESSMENT/PLAN Mr. JERYL UMHOLTZ is a 80 y.o. male with history of "pre" DM II, HLD, HTN, CLL, vascular dementia, remote left cerebellar lacunar infarct, BPH, gastric AVM with weight loss, remote tobacco abuse, anxiety, and Vit D deficiency. Patient was brought in by wife for a fall this am secondary to leg "not working". Wife stated over past few months, patient c/o generalized weakness, but today was acutely different and  weak on the left. He was in his usual state of health yesterday before bed at 2030 hours 08/08/21.   In review of chart, Patient was followed for vascular dementia by Dr. Krista Blue at [facility] MRI b in 4/22 showed mild atrophy, mild supratentorial small vessel disease, and left cerebellar lacunar infarct. Chemotherapy for CLL  finished in 2021. There is documented generalized weakness for 8 months or so.   Acute encephalopathy due to flu A and fever vs. TIA CT head  Atrophy with small vessel chronic ischemic changes of deep cerebral white matter. No acute intracranial abnormalities. CT C-spine  Multilevel degenerative disc and facet disease changes of the cervical spine as above. No acute cervical spine abnormalities MRI  No evidence of acute intracranial abnormality, including acute infarct. MRA  Negative MRA of the head and neck. No large vessel occlusion or hemodynamically significant stenosis MRI C-spine unremarkable Patient can follow up with Dr. Krista Blue post discharge 2D Echo EF 60-65%.  LDL 103 HgbA1c 5.6 VTE prophylaxis - SCD's/ Lovenox aspirin 81 mg daily prior to admission, now on aspirin 81 mg daily and clopidogrel 75 mg daily for 3 weeks then ASA 81mg  alone  Therapy recommendations:  SNF Disposition:  pending  Hypertension Home meds:  Hyzaar 100-25mg  Stable Long-term BP goal normotensive  Hyperlipidemia Home meds:  crestor 10mg , resumed in hospital LDL 103, goal < 70 Increase crestor to 20mg  Continue statin at discharge  Flu Flu A PCR + Spiking fever at 101 On tamiflu  Other Stroke Risk Factors Advanced Age >/= 61   Other Active Problems Vascular Dementia  Anxiety  BPH  Neurology will sign off, please call with questions or concerns  Hospital day # 0  Beulah Gandy, NP   To contact Stroke Continuity provider, please refer to [email or web address] After hours, contact General Neurology

## 2021-08-10 NOTE — Progress Notes (Signed)
Physical Therapy Treatment Patient Details Name: Javier [name] MRN: [id number] DOB: [birth year] Today's Date: 08/10/2021   History of Present Illness Pt is an 80 y.o. male who presented 08/09/21 s/p fall with reports of L sided weakness. CT and MRI of head negative for acute intracranial processes. All other imaging also negative 11/11. Pt positive for influenza A. PMHx of "pre" DM II, HLD, HTN, CLL, vascular dementia, remote lacunar stroke, BPH, gastric AVM with weight loss, remote tobacco abuse, anxiety, and Vit D deficiency.    PT Comments    Pt received in supine, c/o feeling sweaty (no fever per RN) but eager to mobilize and agreeable to OOB activity. Pt continues to demonstrate strong posterior lean with all standing tasks, needing RW and up to +2 maxA for transfers and pivotal steps to chair. Pt unable to safely progress gait in room this session. Recommend RN use Stedy with +2 to safely assist pt back to bed once done sitting up in chair. Discussed disposition with pt (spouse not in room at time of session) and pt agreeable to short term post-acute rehab due to continued significant weakness and need for +2 physical assist as he reports his spouse could not safely help him around the home as he is. Also discussed with supervising PT Anessa P and disposition updated below.    Recommendations for follow up therapy are one component of a multi-disciplinary discharge planning process, led by the attending physician.  Recommendations may be updated based on patient status, additional functional criteria and insurance authorization.  Follow Up Recommendations  Skilled nursing-short term rehab (<3 hours/day)     Assistance Recommended at Discharge Frequent or constant Supervision/Assistance  Equipment Recommendations  BSC/3in1;Other (comment);Hospital bed (tub bench)    Recommendations for Other Services       Precautions / Restrictions Precautions Precautions: Fall Precaution Comments:  droplet (flu) Restrictions Weight Bearing Restrictions: No     Mobility  Bed Mobility Overal bed mobility: Needs Assistance Bed Mobility: Supine to Sit     Supine to sit: Min assist;HOB elevated     General bed mobility comments: Pt needing min A to scoot forward to EOB once long sitting in bed, use of rails    Transfers Overall transfer level: Needs assistance Equipment used: Rolling walker (2 wheels) Transfers: Sit to/from Stand;Bed to chair/wheelchair/BSC Sit to Stand: Max assist;Mod assist;Min assist;+2 physical assistance;+2 safety/equipment;From elevated surface Stand pivot transfers: Max assist;+2 safety/equipment;+2 physical assistance   Step pivot transfers: Max assist;+2 physical assistance;From elevated surface     General transfer comment: Initial stand required max A +2 to power up and steady, each stand after inital one, pt required less and less assist. For step pivot to chair, pt requiring max A +2 due to increased weakness, difficulty moving BLE, and heavy posterior lean    Ambulation/Gait             Pre-gait activities: standing hip flexion and pivotal steps to chair, +2 maxA with RW and posterior lean throughout; no functional gait      Modified Rankin (Stroke Patients Only) Modified Rankin (Stroke Patients Only) Pre-Morbid Rankin Score: Moderate disability Modified Rankin: Moderately severe disability     Balance Overall balance assessment: Needs assistance;History of Falls Sitting-balance support: No upper extremity supported;Feet supported Sitting balance-Leahy Scale: Good Sitting balance - Comments: Pt reaching forward and to the side out of his base of support for exercises with no difficulties or LOB Postural control: Posterior lean Standing balance support: Reliant on assistive device  for balance;Bilateral upper extremity supported;During functional activity Standing balance-Leahy Scale: Zero Standing balance comment: Pt requiring Max  A +2 for dynamic balance due to posterior lean. Static balance pt is able to stand with minimal support holding on to RW for short time periods but fatigues quickly.       Cognition Arousal/Alertness: Awake/alert Behavior During Therapy: WFL for tasks assessed/performed Overall Cognitive Status: History of cognitive impairments - at baseline      General Comments: Pt with hx of dementia. Pt with poor awareness into his deficits and problem-solving how to correct them even when cued. Poor sequencing, but pt today able to recognize need for more post-acute rehab prior to going home since he still needs +2 physical assist to get OOB, he knows his wife cannot safely help him by herself.        Exercises Other Exercises Other Exercises: BUE: reaching out to the front and to the sides in sitting x5, shoulder shrugs x5 Other Exercises: STS x 3 reps from chair Other Exercises: standing hip flexion x10 reps ea Other Exercises: seated BLE AROM: hip flexion x10 reps ea Other Exercises: supine BLE AROM: ankle pumps, AAROM heel slides and hip abduction x10 reps ea    General Comments General comments (skin integrity, edema, etc.): HR 114 bpm with exertion, pt c/o "feeling warm" and noted to be diaphoretic after activity, pt reports cool washcloth feels better on forehead. RN took temperature just prior to session and reports no fever. ECG tech entering room after session so pt reclined back in chair.      Pertinent Vitals/Pain Pain Assessment: No/denies pain Pain Intervention(s): Monitored during session;Repositioned    Home Living Family/patient expects to be discharged to:: Private residence Living Arrangements: Spouse/significant other Available Help at Discharge: Family;Available 24 hours/day Type of Home: House Home Access: Level entry       Home Layout: One level Home Equipment: Conservation officer, nature (2 wheels);Rollator (4 wheels);Shower seat          PT Goals (current goals can now be  found in the care plan section) Acute Rehab PT Goals Patient Stated Goal: to get stronger before going home PT Goal Formulation: With patient Time For Goal Achievement: 08/23/21 Progress towards PT goals: Progressing toward goals    Frequency    Min 3X/week      PT Plan Discharge plan needs to be updated    Co-evaluation PT/OT/SLP Co-Evaluation/Treatment: Yes Reason for Co-Treatment: Complexity of the patient's impairments (multi-system involvement);Necessary to address cognition/behavior during functional activity;For patient/therapist safety;To address functional/ADL transfers PT goals addressed during session: Mobility/safety with mobility;Balance;Proper use of DME;Strengthening/ROM        AM-PAC PT "6 Clicks" Mobility   Outcome Measure  Help needed turning from your back to your side while in a flat bed without using bedrails?: A Lot (mod cues for all tasks below) Help needed moving from lying on your back to sitting on the side of a flat bed without using bedrails?: A Lot Help needed moving to and from a bed to a chair (including a wheelchair)?: A Lot Help needed standing up from a chair using your arms (e.g., wheelchair or bedside chair)?: A Lot Help needed to walk in hospital room?: Total Help needed climbing 3-5 steps with a railing? : Total 6 Click Score: 10    End of Session Equipment Utilized During Treatment: Gait belt Activity Tolerance: Patient tolerated treatment well;Patient limited by fatigue Patient left: in chair;with call bell/phone within reach;with chair alarm set;Other (comment) (  [place] notified that chair alarm needs back panel to cover batteries, none in storage room) Nurse Communication: Mobility status;Need for lift equipment (use Stedy with +2 for back to bed) PT Visit Diagnosis: Unsteadiness on feet (R26.81);Other abnormalities of gait and mobility (R26.89);Muscle weakness (generalized) (M62.81);History of falling (Z91.81);Difficulty in walking, not  elsewhere classified (R26.2)     Time: 6122-4497 PT Time Calculation (min) (ACUTE ONLY): 29 min  Charges:  $Therapeutic Exercise: 8-22 mins                     Salvatore Shear P., PTA Acute Rehabilitation Services Pager: [phone] Office: [place] 08/10/2021, 1:26 PM

## 2021-08-10 NOTE — NC FL2 (Signed)
[place] LEVEL OF CARE SCREENING TOOL     IDENTIFICATION  Patient Name: Javier [name] Birthdate: 07-Aug-1941 Sex: male Admission Date (Current Location): 08/09/2021  [facility] and [place] Number:  Herbalist and Address:  The . [facility], [place] [address], [place], [place] [address]      Provider Number: [id number]  Attending Physician Name and Address:  Antonieta Pert, MD  Relative Name and Phone Number:  Ziyan Schoon [phone]    Current Level of Care: Hospital Recommended Level of Care: [place] Prior Approval Number:    Date Approved/Denied:   PASRR Number: [id number]  Discharge Plan: SNF    Current Diagnoses: Patient Active Problem List   Diagnosis Date Noted   Left-sided weakness 08/10/2021   Weakness 08/09/2021   Personal history of CLL (chronic lymphocytic leukemia) 08/09/2021   Pancytopenia ([place]) 08/09/2021   Fall at home, initial encounter 08/09/2021   Influenza A 08/09/2021   At high risk for falls 04/29/2021   Vascular dementia ([place]) 03/28/2021   Sleeping difficulty 03/28/2021   Agitation 03/28/2021   History of lacunar cerebrovascular accident 01/09/2021   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   BPH with obstruction/lower urinary tract symptoms 01/09/2021   First degree AV block 01/09/2021   Gait abnormality 12/20/2020   Confusion 12/20/2020   BMI 26.0-26.9,adult 03/06/2020   Gastric AVM 03/06/2020   History of colon polyps 03/06/2020   Aortic atherosclerosis ([place]) - per CT 04/2016 08/23/2019   Memory changes 05/25/2019   Counseling regarding advance care planning and goals of care 01/24/2019   Anxiety 02/08/2018   CLL (chronic lymphocytic leukemia) ([place]) 11/01/2016   Encounter for Medicare annual wellness exam 09/11/2015   GERD  02/09/2015   Medication management 11/02/2013   Hyperlipidemia    Hypertension    Abnormal glucose    Vitamin D deficiency    Diverticulosis      Orientation RESPIRATION BLADDER Height & Weight     Self, Time, Situation, Place  Normal Continent Weight:   Height:     BEHAVIORAL SYMPTOMS/MOOD NEUROLOGICAL BOWEL NUTRITION STATUS      Continent Diet (See DC summary)  AMBULATORY STATUS COMMUNICATION OF NEEDS Skin   Extensive Assist Verbally Normal                       Personal Care Assistance Level of Assistance  Bathing, Feeding, Dressing Bathing Assistance: Maximum assistance Feeding assistance: Limited assistance Dressing Assistance: Maximum assistance     Functional Limitations Info  Sight, Hearing, Speech Sight Info: Impaired Hearing Info: Impaired Speech Info: Adequate    SPECIAL CARE FACTORS FREQUENCY  PT (By licensed PT), OT (By licensed OT)     PT Frequency: 5x week OT Frequency: 5x week            Contractures Contractures Info: Not present    Additional Factors Info  Code Status, Allergies, Psychotropic Code Status Info: Full Allergies Info: Penicillins   Ppd (Tuberculin Purified Protein Derivative) Psychotropic Info: Zoloft         Current Medications (08/10/2021):  This is the current hospital active medication list Current Facility-Administered Medications  Medication Dose Route Frequency Provider Last Rate Last Admin   acetaminophen (TYLENOL) tablet 650 mg  650 mg Oral Q4H PRN Fuller Plan A, MD   650 mg at 08/10/21 1026   Or   acetaminophen (TYLENOL) 160 MG/5ML solution 650 mg  650 mg Per Tube Q4H PRN Fuller Plan  A, MD       Or   acetaminophen (TYLENOL) suppository 650 mg  650 mg Rectal Q4H PRN Norval Morton, MD       ALPRAZolam Duanne Moron) tablet 0.5 mg  0.5 mg Oral Daily PRN Fuller Plan A, MD       aspirin EC tablet 81 mg  81 mg Oral Daily Kirby-Graham, Karsten Fells, NP   81 mg at 08/10/21 1027   busPIRone (BUSPAR) tablet 5 mg  5 mg Oral TID Fuller Plan A, MD   5 mg at 08/10/21 1027   clopidogrel (PLAVIX) tablet 75 mg  75 mg Oral Daily Kirby-Graham, Karsten Fells, NP   75 mg at  08/10/21 1027   enoxaparin (LOVENOX) injection 40 mg  40 mg Subcutaneous Daily Erna Brossard, Rondell A, MD   40 mg at 08/10/21 1029   guaiFENesin (MUCINEX) 12 hr tablet 600 mg  600 mg Oral BID Fuller Plan A, MD   600 mg at 08/10/21 1027   lipase/protease/amylase (CREON) capsule 12,000 Units  12,000 Units Oral TID WC Fuller Plan A, MD   12,000 Units at 08/10/21 1225   oseltamivir (TAMIFLU) capsule 75 mg  75 mg Oral BID Fuller Plan A, MD   75 mg at 08/10/21 1027   pantoprazole (PROTONIX) EC tablet 40 mg  40 mg Oral Daily Samyria Rudie, Rondell A, MD   40 mg at 08/10/21 1027   [START ON 08/11/2021] rosuvastatin (CRESTOR) tablet 20 mg  20 mg Oral Daily Rosalin Hawking, MD       sertraline (ZOLOFT) tablet 50 mg  50 mg Oral Daily Willodene Stallings, Rondell A, MD   50 mg at 08/10/21 1027   tamsulosin (FLOMAX) capsule 0.4 mg  0.4 mg Oral QPC supper Norval Morton, MD         Discharge Medications: Please see discharge summary for a list of discharge medications.  Relevant Imaging Results:  Relevant Lab Results:   Additional Information SS# [id number] [place]

## 2021-08-10 NOTE — Progress Notes (Signed)
Inpatient Rehab Admissions Coordinator:   Per therapy notes, patient was screened for CIR candidacy by Clemens Catholic, MS, CCC-SLP. At this time, MRI and CT of head are negative, deficits thought to be 2/2 to TIA or possibly acute influenza. Pt. does not appear to demonstrate medical necessity to justify in hospital rehabilitation/CIR. will not pursue a rehab consult for this Pt.   Recommend other rehab venues to be pursued.  Please contact me with any questions.  Clemens Catholic, [place], [place] Admissions Coordinator  [phone] ([place]) [phone] (office)

## 2021-08-11 DIAGNOSIS — Y92009 Unspecified place in unspecified non-institutional (private) residence as the place of occurrence of the external cause: Secondary | ICD-10-CM

## 2021-08-11 DIAGNOSIS — J101 Influenza due to other identified influenza virus with other respiratory manifestations: Secondary | ICD-10-CM

## 2021-08-11 DIAGNOSIS — W19XXXA Unspecified fall, initial encounter: Secondary | ICD-10-CM

## 2021-08-11 DIAGNOSIS — D61818 Other pancytopenia: Secondary | ICD-10-CM

## 2021-08-11 DIAGNOSIS — F01B0 Vascular dementia, moderate, without behavioral disturbance, psychotic disturbance, mood disturbance, and anxiety: Secondary | ICD-10-CM

## 2021-08-11 DIAGNOSIS — F419 Anxiety disorder, unspecified: Secondary | ICD-10-CM

## 2021-08-11 DIAGNOSIS — E782 Mixed hyperlipidemia: Secondary | ICD-10-CM

## 2021-08-11 DIAGNOSIS — I10 Essential (primary) hypertension: Secondary | ICD-10-CM

## 2021-08-11 DIAGNOSIS — Z856 Personal history of leukemia: Secondary | ICD-10-CM

## 2021-08-11 NOTE — Plan of Care (Signed)
  Problem: Health Behavior/Discharge Planning: Goal: Ability to manage health-related needs will improve Outcome: Progressing   Problem: Clinical Measurements: Goal: Ability to maintain clinical measurements within normal limits will improve Outcome: Progressing Goal: Will remain free from infection Outcome: Progressing Goal: Diagnostic test results will improve Outcome: Progressing Goal: Respiratory complications will improve Outcome: Progressing Goal: Cardiovascular complication will be avoided Outcome: Progressing   Problem: Activity: Goal: Risk for activity intolerance will decrease Outcome: Progressing   Problem: Nutrition: Goal: Adequate nutrition will be maintained Outcome: Progressing   Problem: Coping: Goal: Level of anxiety will decrease Outcome: Progressing   Problem: Elimination: Goal: Will not experience complications related to bowel motility Outcome: Progressing Goal: Will not experience complications related to urinary retention Outcome: Progressing   Problem: Pain Managment: Goal: General experience of comfort will improve Outcome: Progressing   Problem: Safety: Goal: Ability to remain free from injury will improve Outcome: Progressing   Problem: Skin Integrity: Goal: Risk for impaired skin integrity will decrease Outcome: Progressing   Problem: Education: Goal: Knowledge of disease or condition will improve Outcome: Progressing Goal: Knowledge of secondary prevention will improve (SELECT ALL) Outcome: Progressing Goal: Knowledge of patient specific risk factors will improve (INDIVIDUALIZE FOR PATIENT) Outcome: Progressing

## 2021-08-11 NOTE — Plan of Care (Signed)
?  Problem: Education: ?Goal: Knowledge of disease or condition will improve ?Outcome: Progressing ?Goal: Knowledge of secondary prevention will improve (SELECT ALL) ?Outcome: Progressing ?Goal: Knowledge of patient specific risk factors will improve (INDIVIDUALIZE FOR PATIENT) ?Outcome: Progressing ?  ?

## 2021-08-11 NOTE — Progress Notes (Signed)
PROGRESS NOTE    [name]  YIR:[id number] DOB: 24-Feb-1941 DOA: 08/09/2021 PCP: Unk Pinto, MD   Brief Narrative/Hospital Course:  Javier [name], 80 y.o. male with past medical history of hypertension, hyperlipidemia, CLL, BPH and prediabetes presented to hospital with generalized weakness for the past 8 months with falls.  There was also mention of left-sided weakness cough and sputum production for last 2 days.  In the ED patient had hemoglobin of 12.5.  Platelets of 112.  Total bilirubin of 1.5.  Patient tested positive for influenza A.  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  Chest x-ray was unremarkable.  Due to concern for stroke, patient was initially admitted to hospital and neurology was consulted.   Assessment & Plan:  Fall with left-sided Carmin Muskrat thought to be secondary to influenza and possible TIA.  MRI/MRA negative for acute findings.  MRI of the C-spine shows degeneration.  Neurology had seen the patient recently.  Continue PT OT.  Awaiting for skilled nursing facility placement for rehab.  Generalized weakness, Physical deconditioning/debility.   For 8 months.  Continue aspirin, Plavix and statin.  Physical therapy, Occupational Therapy has seen the patient and recommended skilled nursing facility.  Influenza A:  On Tamiflu.  Continue droplet precautions.   Hyperlipidemia,Hypertension: continue Crestor.  Patient is on losartan HCTZ as outpatient.  Currently on hold..  Vascular dementia, Anxiety:  On BuSpar and Zoloft.  Continue delirium precautions   history of CLL,Thrombocytopenia: Patient follows up with Dr. Irene Limbo oncology as outpatient.    Abnormal total bilirubin.  Continue monitor closely  BPH on Flomax   DVT prophylaxis: enoxaparin (LOVENOX) injection 40 mg Start: 08/09/21 1230  Code Status: Full code  Family  Communication:  Spoke with the patient at bedside  Status is: Inpatient  Patient remains inpatient due to physical deconditioning, debility, influenza, need for skilled nursing facility placement.  Subjective: Today, patient was seen and examined at bedside.  Denies any chest pain, shortness of breath, stuffy nose, sore throat.  Objective:  Vitals:   08/11/21 0030 08/11/21 0358 08/11/21 0827 08/11/21 1226  BP: 134/67 139/71 (!) 142/68 (!) 146/73  Pulse: 70 70 65 64  Resp: 19 18 18 18   Temp: 98.6 F (37 C) 98.7 F (37.1 C) 98.4 F (36.9 C) 98.4 F (36.9 C)  TempSrc:   Oral Oral  SpO2: 97% 98% 100% 99%   Weight change:   Intake/Output Summary (Last 24 hours) at 08/11/2021 1259 Last data filed at 08/11/2021 0900 Gross per 24 hour  Intake 360 ml  Output --  Net 360 ml    Net IO Since Admission: 480 mL [08/11/21 1259]   Physical Examination: General:  Average built, not in obvious distress, frail weak and older than stated age HENT:   No scleral pallor or icterus noted. Oral mucosa is moist.  Chest:    Diminished breath sounds bilaterally. No crackles or wheezes.  CVS: S1 &S2 heard. No murmur.  Regular rate and rhythm. Abdomen: Soft, nontender, nondistended.  Bowel sounds are heard.   Extremities: No cyanosis, clubbing or edema.  Peripheral pulses are palpable. Psych: Alert, awake and oriented, normal mood CNS:  No cranial nerve deficits.  Power equal in all extremities.   Skin: Warm and dry.  No rashes noted.  Medications reviewed: Scheduled Meds:   aspirin EC  81 mg Oral Daily  busPIRone  5 mg Oral TID   clopidogrel  75 mg Oral Daily   enoxaparin (LOVENOX) injection  40 mg Subcutaneous Daily   guaiFENesin  600 mg Oral BID   lipase/protease/amylase  12,000 Units Oral TID WC   oseltamivir  75 mg Oral BID   pantoprazole  40 mg Oral Daily   rosuvastatin  20 mg Oral Daily   sertraline  50 mg Oral Daily   tamsulosin  0.4 mg Oral QPC supper    Consultants:   Neurology  Procedures: None  Antimicrobials: Anti-infectives (From admission, onward)    Start     Dose/Rate Route Frequency Ordered Stop   08/09/21 1400  oseltamivir (TAMIFLU) capsule 75 mg        75 mg Oral 2 times daily 08/09/21 1353 08/14/21 0959      Culture/Microbiology    Component Value Date/Time   SDES  04/20/2021 1830    URINE, CLEAN CATCH Performed at [facility], [address] [place], [place] [address]    [facility]  04/20/2021 1830    NONE Performed at [facility], [address], [place], [place] [address]    CULT MULTIPLE SPECIES PRESENT, SUGGEST RECOLLECTION (A) 04/20/2021 1830   REPTSTATUS 04/22/2021 FINAL 04/20/2021 1830     Data Reviewed: I have personally reviewed the following labs and imaging studies   CBC: Recent Labs  Lab 08/09/21 0828 08/09/21 0925 08/10/21 0323  WBC 5.8  --  6.1  NEUTROABS 4.9  --  4.8  HGB 12.5* 13.3 12.6*  HCT 38.5* 39.0 38.9*  MCV 93.2  --  93.3  PLT 112*  --  PLATELET CLUMPS NOTED ON SMEAR, COUNT APPEARS DECREASED    Basic Metabolic Panel: Recent Labs  Lab 08/09/21 0828 08/09/21 0925 08/10/21 0323  NA 137 139 139  K 4.5 3.9 3.5  CL 106 105 105  CO2 24  --  24  GLUCOSE 81 81 75  BUN 14 14 15   CREATININE 1.20 1.20 1.04  CALCIUM 9.4  --  9.2    GFR: CrCl cannot be calculated (Unknown ideal weight.). Liver Function Tests: Recent Labs  Lab 08/09/21 0828 08/10/21 0323  AST 21 24  ALT 11 14  ALKPHOS 49 50  BILITOT 1.5* 0.9  PROT 6.1* 6.4*  ALBUMIN 3.7 3.6    No results for input(s): LIPASE, AMYLASE in the last 168 hours. No results for input(s): AMMONIA in the last 168 hours. Coagulation Profile: Recent Labs  Lab 08/09/21 0828  INR 1.1    Cardiac Enzymes: Recent Labs  Lab 08/09/21 0935  CKTOTAL 123    BNP (last 3 results) No results for input(s): PROBNP in the last 8760 hours. HbA1C: Recent Labs    08/10/21 0323  HGBA1C 5.6    CBG: No results for input(s): GLUCAP in the last 168 hours. Lipid Profile: Recent Labs    08/10/21 0323  CHOL 174  HDL 55  LDLCALC 103*  TRIG 79  CHOLHDL 3.2   Thyroid Function Tests: Recent Labs    08/10/21 0323  TSH 1.000    Anemia Panel: No results for input(s): VITAMINB12, FOLATE, FERRITIN, TIBC, IRON, RETICCTPCT in the last 72 hours. Sepsis Labs: No results for input(s): PROCALCITON, LATICACIDVEN in the last 168 hours.  Recent Results (from the past 240 hour(s))  Resp Panel by RT-PCR (Flu A&B, Covid) Nasopharyngeal Swab     Status: Abnormal   Collection Time: 08/09/21  8:28 AM   Specimen: Nasopharyngeal Swab; Nasopharyngeal(NP) swabs in vial  transport medium  Result Value Ref Range Status   SARS Coronavirus 2 by RT PCR NEGATIVE NEGATIVE Final    Comment: (NOTE) SARS-CoV-2 target nucleic acids are NOT DETECTED.  The SARS-CoV-2 RNA is generally detectable in upper respiratory specimens during the acute phase of infection. The lowest concentration of SARS-CoV-2 viral copies this assay can detect is 138 copies/mL. A negative result does not preclude SARS-Cov-2 infection and should not be used as the sole basis for treatment or other patient management decisions. A negative result may occur with  improper specimen collection/handling, submission of specimen other than nasopharyngeal swab, presence of viral mutation(s) within the areas targeted by this assay, and inadequate number of viral copies(<138 copies/mL). A negative result must be combined with clinical observations, patient history, and epidemiological information. The expected result is Negative.  Fact Sheet for Patients:  EntrepreneurPulse.com.au  Fact Sheet for Healthcare Providers:  IncredibleEmployment.be  This test is no t yet approved or cleared by the [place] FDA and  has been authorized for detection and/or diagnosis of SARS-CoV-2 by FDA under an  Emergency Use Authorization (EUA). This EUA will remain  in effect (meaning this test can be used) for the duration of the COVID-19 declaration under Section 564(b)(1) of the Act, 21 U.S.C.section 360bbb-3(b)(1), unless the authorization is terminated  or revoked sooner.       Influenza A by PCR POSITIVE (A) NEGATIVE Final   Influenza B by PCR NEGATIVE NEGATIVE Final    Comment: (NOTE) The Xpert Xpress SARS-CoV-2/FLU/RSV plus assay is intended as an aid in the diagnosis of influenza from Nasopharyngeal swab specimens and should not be used as a sole basis for treatment. Nasal washings and aspirates are unacceptable for Xpert Xpress SARS-CoV-2/FLU/RSV testing.  Fact Sheet for Patients: EntrepreneurPulse.com.au  Fact Sheet for Healthcare Providers: IncredibleEmployment.be  This test is not yet approved or cleared by the [place] FDA and has been authorized for detection and/or diagnosis of SARS-CoV-2 by FDA under an Emergency Use Authorization (EUA). This EUA will remain in effect (meaning this test can be used) for the duration of the COVID-19 declaration under Section 564(b)(1) of the Act, 21 U.S.C. section 360bbb-3(b)(1), unless the authorization is terminated or revoked.  Performed at [facility], [place] [address] [place], [place] [address]       Radiology Studies: MR ANGIO HEAD WO CONTRAST  Result Date: 08/10/2021 CLINICAL DATA:  Initial evaluation for acute TIA. EXAM: MRA HEAD WITHOUT CONTRAST MRA NECK WITHOUT CONTRAST TECHNIQUE: Multiplanar, multi-echo pulse sequences of the brain and surrounding structures were acquired without intravenous contrast. Angiographic images of the Circle of Willis were acquired using MRA technique without intravenous contrast. Angiographic images of the neck were acquired using MRA technique without intravenous contrast. Carotid stenosis measurements (when applicable) are obtained utilizing  NASCET criteria, using the distal internal carotid diameter as the denominator. COMPARISON:  Comparison made with prior MRI from earlier the same day. FINDINGS: MRA HEAD FINDINGS Anterior circulation: Examination mildly degraded by motion artifact. Visualized distal cervical segments of the internal carotid arteries are patent with antegrade flow. Petrous segments patent bilaterally. Mild for age atheromatous irregularity within the carotid siphons without stenosis or other abnormality. A1 segments patent bilaterally. Normal anterior communicating artery complex. Anterior cerebral arteries demonstrate mild diffuse atheromatous irregularity but are patent without significant stenosis. No M1 stenosis or occlusion. Normal MCA bifurcations. Distal MCA branches well perfused and symmetric. Posterior circulation: Vertebral arteries are somewhat tortuous but widely patent to the vertebrobasilar junction. Left  vertebral artery slightly dominant. Both PICA patent. Basilar tortuous but widely patent to its distal aspect without stenosis. Superior cerebellar arteries patent bilaterally. Both PCAs primarily supplied via the basilar well perfused to their distal aspects without stenosis. Anatomic variants: None significant. No aneurysm or other vascular abnormality. MRA NECK FINDINGS Aortic arch: Visualized aortic arch normal caliber with normal 3 vessel morphology. No stenosis or other abnormality about the origin of the great vessels. Right carotid system: Right common and internal carotid arteries patent without stenosis, evidence for dissection or occlusion. No significant atheromatous narrowing about the right carotid bulb. Left carotid system: Left common and internal carotid arteries patent without stenosis, evidence for dissection or occlusion. No significant atheromatous narrowing about the left carotid bulb. Vertebral arteries: Both vertebral arteries arise from the subclavian arteries. Left vertebral artery slightly  dominant. Vertebral arteries patent without stenosis, evidence for dissection or occlusion. Other: None IMPRESSION: Negative MRA of the head and neck. No large vessel occlusion or hemodynamically significant stenosis. Electronically Signed   By: Jeannine Boga M.D.   On: 08/10/2021 05:00   MR ANGIO NECK WO CONTRAST  Result Date: 08/10/2021 CLINICAL DATA:  Initial evaluation for acute TIA. EXAM: MRA HEAD WITHOUT CONTRAST MRA NECK WITHOUT CONTRAST TECHNIQUE: Multiplanar, multi-echo pulse sequences of the brain and surrounding structures were acquired without intravenous contrast. Angiographic images of the Circle of Willis were acquired using MRA technique without intravenous contrast. Angiographic images of the neck were acquired using MRA technique without intravenous contrast. Carotid stenosis measurements (when applicable) are obtained utilizing NASCET criteria, using the distal internal carotid diameter as the denominator. COMPARISON:  Comparison made with prior MRI from earlier the same day. FINDINGS: MRA HEAD FINDINGS Anterior circulation: Examination mildly degraded by motion artifact. Visualized distal cervical segments of the internal carotid arteries are patent with antegrade flow. Petrous segments patent bilaterally. Mild for age atheromatous irregularity within the carotid siphons without stenosis or other abnormality. A1 segments patent bilaterally. Normal anterior communicating artery complex. Anterior cerebral arteries demonstrate mild diffuse atheromatous irregularity but are patent without significant stenosis. No M1 stenosis or occlusion. Normal MCA bifurcations. Distal MCA branches well perfused and symmetric. Posterior circulation: Vertebral arteries are somewhat tortuous but widely patent to the vertebrobasilar junction. Left vertebral artery slightly dominant. Both PICA patent. Basilar tortuous but widely patent to its distal aspect without stenosis. Superior cerebellar arteries  patent bilaterally. Both PCAs primarily supplied via the basilar well perfused to their distal aspects without stenosis. Anatomic variants: None significant. No aneurysm or other vascular abnormality. MRA NECK FINDINGS Aortic arch: Visualized aortic arch normal caliber with normal 3 vessel morphology. No stenosis or other abnormality about the origin of the great vessels. Right carotid system: Right common and internal carotid arteries patent without stenosis, evidence for dissection or occlusion. No significant atheromatous narrowing about the right carotid bulb. Left carotid system: Left common and internal carotid arteries patent without stenosis, evidence for dissection or occlusion. No significant atheromatous narrowing about the left carotid bulb. Vertebral arteries: Both vertebral arteries arise from the subclavian arteries. Left vertebral artery slightly dominant. Vertebral arteries patent without stenosis, evidence for dissection or occlusion. Other: None IMPRESSION: Negative MRA of the head and neck. No large vessel occlusion or hemodynamically significant stenosis. Electronically Signed   By: Jeannine Boga M.D.   On: 08/10/2021 05:00   MR CERVICAL SPINE WO CONTRAST  Result Date: 08/10/2021 CLINICAL DATA:  Acute or progressive myelopathy EXAM: MRI CERVICAL SPINE WITHOUT CONTRAST TECHNIQUE: Multiplanar, multisequence MR imaging of  the cervical spine was performed. No intravenous contrast was administered. COMPARISON:  CT from yesterday FINDINGS: Alignment: Slight retrolisthesis at C3-4 and C4-5. Vertebrae: No fracture, evidence of discitis, or bone lesion. Cord: Normal signal and morphology. Posterior Fossa, vertebral arteries, paraspinal tissues: Negative. Disc levels: C2-3: Narrow disc.  No impingement C3-4: Narrow disc with endplate and uncovertebral ridging. Moderate left foraminal stenosis C4-5: Disc narrowing and bulging with uncovertebral ridging. Right foraminal impingement and mild left  foraminal stenosis C5-6: Disc narrowing and bulging with uncovertebral ridging. Moderate bilateral foraminal stenosis, greater on the right C6-7: Disc narrowing and bulging with uncovertebral ridging eccentric to the right. Biforaminal impingement worse on the right. C7-T1:Mild disc narrowing IMPRESSION: Ordinary cervical spine degeneration with multilevel foraminal stenosis as described. No cord impingement or signal abnormality to correlate with history of myelopathy. Electronically Signed   By: Jorje Guild M.D.   On: 08/10/2021 09:24   DG CHEST PORT 1 VIEW  Result Date: 08/09/2021 CLINICAL DATA:  Weakness, fall EXAM: PORTABLE CHEST 1 VIEW COMPARISON:  Radiograph 08/09/2021 FINDINGS: Unchanged cardiomediastinal silhouette. There is no focal airspace consolidation. There is no large pleural effusion or visible pneumothorax. There is no acute osseous abnormality. IMPRESSION: No evidence of acute cardiopulmonary disease. Electronically Signed   By: Maurine Simmering M.D.   On: 08/09/2021 14:33   ECHOCARDIOGRAM COMPLETE  Result Date: 08/10/2021    ECHOCARDIOGRAM REPORT   Patient Name:   Javier [name] Scnetx Date of Exam: 08/10/2021 Medical Rec #:  [id number]        Height:       72.0 in Accession #:    [id number]       Weight:       163.0 lb Date of Birth:  Aug 17, 1941         BSA:          1.953 m Patient Age:    84 years         BP:           153/114 mmHg Patient Gender: M                HR:           74 bpm. Exam Location:  Inpatient Procedure: 2D Echo, Cardiac Doppler and Color Doppler Indications:    TIA  History:        Patient has prior history of Echocardiogram examinations, most                 recent 02/07/2020.  Sonographer:    Glo Herring Referring Phys: [id number] [place]  1. Normal LV function, all walls are not well visualized. Left ventricular ejection fraction, by estimation, is 60 to 65%. The left ventricle has normal function. Left ventricular diastolic parameters are consistent with  Grade I diastolic dysfunction (impaired relaxation).  2. Right ventricular systolic function is normal. The right ventricular size is normal. Tricuspid regurgitation signal is inadequate for assessing PA pressure.  3. The mitral valve was not well visualized. No evidence of mitral valve regurgitation.  4. The aortic valve was not well visualized. Aortic valve regurgitation is not visualized.  5. Small aortic root aneurysm 3.4 cm. Aortic dilatation noted.  6. The inferior vena cava is normal in size with greater than 50% respiratory variability, suggesting right atrial pressure of 3 mmHg. Conclusion(s)/Recommendation(s): Limited study, considerable artifact, LV function is normal. ?mass in RA likely eustachian valve. Appears calcified. No significant changes from prior echo 02/07/2020. FINDINGS  Left Ventricle: Normal  LV function, all walls are not well visualized. Left ventricular ejection fraction, by estimation, is 60 to 65%. The left ventricle has normal function. The left ventricular internal cavity size was normal in size. Left ventricular diastolic parameters are consistent with Grade I diastolic dysfunction (impaired relaxation). Right Ventricle: The right ventricular size is normal. No increase in right ventricular wall thickness. Right ventricular systolic function is normal. Tricuspid regurgitation signal is inadequate for assessing PA pressure. Left Atrium: Left atrial size was normal in size. Right Atrium: Right atrial size was normal in size. Pericardium: There is no evidence of pericardial effusion. Mitral Valve: The mitral valve was not well visualized. No evidence of mitral valve regurgitation. Tricuspid Valve: The tricuspid valve is not well visualized. Tricuspid valve regurgitation is not demonstrated. Aortic Valve: The aortic valve was not well visualized. Aortic valve regurgitation is not visualized. Aortic valve mean gradient measures 3.0 mmHg. Aortic valve peak gradient measures 6.1 mmHg. Aortic  valve area, by VTI measures 3.06 cm. Pulmonic Valve: The pulmonic valve was not well visualized. Pulmonic valve regurgitation is trivial. Aorta: Small aortic root aneurysm 3.4 cm. Aortic dilatation noted. Venous: The inferior vena cava is normal in size with greater than 50% respiratory variability, suggesting right atrial pressure of 3 mmHg. IAS/Shunts: The interatrial septum was not well visualized.  LEFT VENTRICLE PLAX 2D LVOT diam:     2.20 cm   Diastology LV SV:         67        LV e' medial:    5.66 cm/s LV SV Index:   34        LV E/e' medial:  13.7 LVOT Area:     3.80 cm  LV e' lateral:   5.98 cm/s                          LV E/e' lateral: 13.0  RIGHT VENTRICLE             IVC RV Basal diam:  3.50 cm     IVC diam: 1.70 cm RV S prime:     15.70 cm/s LEFT ATRIUM             Index        RIGHT ATRIUM           Index LA Vol (A2C):   37.1 ml 18.99 ml/m  RA Area:     18.70 cm LA Vol (A4C):   27.6 ml 14.13 ml/m  RA Volume:   53.90 ml  27.60 ml/m LA Biplane Vol: 31.6 ml 16.18 ml/m  AORTIC VALVE                    PULMONIC VALVE AV Area (Vmax):    2.95 cm     PV Vmax:       0.85 m/s AV Area (Vmean):   2.88 cm     PV Peak grad:  2.9 mmHg AV Area (VTI):     3.06 cm AV Vmax:           123.00 cm/s AV Vmean:          84.633 cm/s AV VTI:            0.219 m AV Peak Grad:      6.1 mmHg AV Mean Grad:      3.0 mmHg LVOT Vmax:         95.30 cm/s LVOT Vmean:  64.100 cm/s LVOT VTI:          0.176 m LVOT/AV VTI ratio: 0.80  AORTA Ao Root diam: 3.40 cm MITRAL VALVE MV Area (PHT): 3.33 cm    SHUNTS MV Decel Time: 228 msec    Systemic VTI:  0.18 m MV E velocity: 77.60 cm/s  Systemic Diam: 2.20 cm MV A velocity: 85.30 cm/s MV E/A ratio:  0.91 Phineas Inches Electronically signed by Phineas Inches Signature Date/Time: 08/10/2021/12:25:00 PM    Final      LOS: 1 day   Flora Lipps, MD [facility] 08/11/2021, 12:59 PM

## 2021-08-12 LAB — CBC
HCT: 33.3 % — ABNORMAL LOW (ref 39.0–52.0)
Hemoglobin: 11.4 g/dL — ABNORMAL LOW (ref 13.0–17.0)
MCH: 30.6 pg (ref 26.0–34.0)
MCHC: 34.2 g/dL (ref 30.0–36.0)
MCV: 89.3 fL (ref 80.0–100.0)
Platelets: 80 10*3/uL — ABNORMAL LOW (ref 150–400)
RBC: 3.73 MIL/uL — ABNORMAL LOW (ref 4.22–5.81)
RDW: 13.9 % (ref 11.5–15.5)
WBC: 3.9 10*3/uL — ABNORMAL LOW (ref 4.0–10.5)
nRBC: 0 % (ref 0.0–0.2)

## 2021-08-12 LAB — BASIC METABOLIC PANEL
Anion gap: 7 (ref 5–15)
BUN: 10 mg/dL (ref 8–23)
CO2: 24 mmol/L (ref 22–32)
Calcium: 8.8 mg/dL — ABNORMAL LOW (ref 8.9–10.3)
Chloride: 106 mmol/L (ref 98–111)
Creatinine, Ser: 0.83 mg/dL (ref 0.61–1.24)
GFR, Estimated: >60 mL/min (ref >60)
Glucose, Bld: 92 mg/dL (ref 70–99)
Potassium: 3.2 mmol/L — ABNORMAL LOW (ref 3.5–5.1)
Sodium: 137 mmol/L (ref 135–145)

## 2021-08-12 MED ORDER — VITAMIN D 25 MCG (1000 UNIT) PO TABS
5000.0000 [IU] | ORAL_TABLET | Freq: Every day | ORAL | Status: DC
  Administered 2021-08-12 – 2021-08-20 (×9): 5000 [IU] via ORAL
  Filled 2021-08-12 (×11): qty 5

## 2021-08-12 MED ORDER — LOSARTAN POTASSIUM 50 MG PO TABS
100.0000 mg | ORAL_TABLET | Freq: Every day | ORAL | Status: DC
Start: 2021-08-12 — End: 2021-08-20
  Administered 2021-08-12 – 2021-08-20 (×9): 100 mg via ORAL
  Filled 2021-08-12 (×9): qty 2

## 2021-08-12 MED ORDER — POTASSIUM CHLORIDE CRYS ER 20 MEQ PO TBCR
40.0000 meq | EXTENDED_RELEASE_TABLET | Freq: Once | ORAL | Status: AC
  Administered 2021-08-12: 40 meq via ORAL
  Filled 2021-08-12: qty 2

## 2021-08-12 MED ORDER — PRAVASTATIN SODIUM 40 MG PO TABS: 40.0000 mg | ORAL_TABLET | Freq: Every day | ORAL | Status: DC

## 2021-08-12 NOTE — Plan of Care (Signed)
  Problem: Education: Goal: Knowledge of General Education information will improve Description: Including pain rating scale, medication(s)/side effects and non-pharmacologic comfort measures Outcome: Progressing   Problem: Health Behavior/Discharge Planning: Goal: Ability to manage health-related needs will improve Outcome: Progressing   Problem: Education: Goal: Knowledge of disease or condition will improve Outcome: Progressing Goal: Knowledge of secondary prevention will improve (SELECT ALL) Outcome: Progressing Goal: Knowledge of patient specific risk factors will improve (INDIVIDUALIZE FOR PATIENT) Outcome: Progressing

## 2021-08-12 NOTE — Plan of Care (Signed)
?  Problem: Education: ?Goal: Knowledge of General Education information will improve ?Description: Including pain rating scale, medication(s)/side effects and non-pharmacologic comfort measures ?Outcome: Progressing ?  ?Problem: Health Behavior/Discharge Planning: ?Goal: Ability to manage health-related needs will improve ?Outcome: Progressing ?  ?Problem: Clinical Measurements: ?Goal: Ability to maintain clinical measurements within normal limits will improve ?Outcome: Progressing ?Goal: Will remain free from infection ?Outcome: Progressing ?Goal: Diagnostic test results will improve ?Outcome: Progressing ?Goal: Respiratory complications will improve ?Outcome: Progressing ?Goal: Cardiovascular complication will be avoided ?Outcome: Progressing ?  ?Problem: Activity: ?Goal: Risk for activity intolerance will decrease ?Outcome: Progressing ?  ?Problem: Nutrition: ?Goal: Adequate nutrition will be maintained ?Outcome: Progressing ?  ?Problem: Coping: ?Goal: Level of anxiety will decrease ?Outcome: Progressing ?  ?Problem: Elimination: ?Goal: Will not experience complications related to bowel motility ?Outcome: Progressing ?Goal: Will not experience complications related to urinary retention ?Outcome: Progressing ?  ?Problem: Pain Managment: ?Goal: General experience of comfort will improve ?Outcome: Progressing ?  ?Problem: Safety: ?Goal: Ability to remain free from injury will improve ?Outcome: Progressing ?  ?Problem: Skin Integrity: ?Goal: Risk for impaired skin integrity will decrease ?Outcome: Progressing ?  ?Problem: Education: ?Goal: Knowledge of disease or condition will improve ?Outcome: Progressing ?Goal: Knowledge of secondary prevention will improve (SELECT ALL) ?Outcome: Progressing ?Goal: Knowledge of patient specific risk factors will improve (INDIVIDUALIZE FOR PATIENT) ?Outcome: Progressing ?  ?

## 2021-08-12 NOTE — Progress Notes (Addendum)
PROGRESS NOTE    Javier [name]  FTD:[id number] DOB: 1941-08-20 DOA: 08/09/2021 PCP: Unk Pinto, MD   Brief Narrative/Hospital Course:  Javier [name], 80 y.o. male with past medical history of hypertension, hyperlipidemia, CLL, BPH and prediabetes presented to hospital with generalized weakness for the past 8 months with falls.  There was also mention of left-sided weakness cough and sputum production for last 2 days.  In the ED patient had hemoglobin of 12.5.  Platelets of 112.  Total bilirubin of 1.5.  Patient tested positive for influenza A.  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  Chest x-ray was unremarkable.  Due to concern for stroke, patient was initially admitted to hospital and neurology was consulted.   Assessment & Plan:  Fall with left-sided weakness,TIA Thought to be secondary to influenza and possible TIA.  MRI/MRA negative for acute findings.  MRI of the C-spine shows degenerative changes.  Neurology had seen the patient recently.  Continue PT, OT.  Awaiting for skilled nursing facility placement for rehab.  Hemoglobin A1c of 5.6.  Lipid profile with LDL of 103.  Generalized weakness, Physical deconditioning/debility.   For 8 months.  Continue aspirin, Plavix and statin.  Physical therapy, Occupational Therapy has seen the patient and recommended skilled nursing facility.  Influenza A:  On Tamiflu.  Continue droplet precautions.  No leukocytosis or fever.  Patient continues to feel well.  Hyperlipidemia,Hypertension: continue Crestor.  Patient is on losartan HCTZ as outpatient.  Will resume losartan at this time.   Vascular dementia, Anxiety:  On BuSpar and Zoloft.  Continue delirium precautions   History of CLL,Thrombocytopenia: Patient follows up with Dr. Irene Limbo, oncology as outpatient.  No evidence of bleeding.  BPH on Flomax    Hypokalemia.  Will replace orally.  Repeat replacement in the evening as well.  Check levels in a.m.  Disposition.  At this time patient is medically stable for disposition to skilled nursing facility.  DVT prophylaxis: enoxaparin (LOVENOX) injection 40 mg Start: 08/09/21 1230  Code Status: Full code  Family Communication:  Spoke with the patient's spouse Ms. [name] on the phone and updated her about the clinical condition of the patient.  Status is: Inpatient  Patient remains inpatient due to physical deconditioning, debility, influenza, need for skilled nursing facility placement.  Subjective: Today, patient was seen and examined at bedside.  Denies any dizziness, lightheadedness, shortness of breath but has mild cough.  Had trouble sleeping.  Objective:  Vitals:   08/11/21 2336 08/12/21 0416 08/12/21 0742 08/12/21 1247  BP: 139/85 132/66 129/74 (!) 147/74  Pulse: 63 60 67 65  Resp: 16 16 18 17   Temp: 97.9 F (36.6 C) 98.6 F (37 C) 98.6 F (37 C) 98.3 F (36.8 C)  TempSrc:    Oral  SpO2: 96% 99% 99% 99%   Weight change:   Intake/Output Summary (Last 24 hours) at 08/12/2021 1300 Last data filed at 08/12/2021 0741 Gross per 24 hour  Intake --  Output 700 ml  Net -700 ml    Net IO Since Admission: -220 mL [08/12/21 1300]    Physical Examination: General:  Average built, not in obvious distress, feels weak. HENT:   No scleral pallor or icterus noted. Oral mucosa is moist.  Chest:  Clear breath sounds.  Diminished breath sounds bilaterally. No crackles or wheezes.  CVS: S1 &S2 heard. No murmur.  Regular rate and rhythm. Abdomen: Soft, nontender, nondistended.  Bowel sounds are heard.   Extremities: No cyanosis, clubbing or edema.  Peripheral pulses are palpable. Psych: Alert, awake and communicative, normal mood CNS:  No cranial nerve deficits.  Power equal in all extremities.   Skin: Warm and dry.  No rashes noted.   Medications reviewed: Scheduled  Meds:   aspirin EC  81 mg Oral Daily   busPIRone  5 mg Oral TID   clopidogrel  75 mg Oral Daily   enoxaparin (LOVENOX) injection  40 mg Subcutaneous Daily   guaiFENesin  600 mg Oral BID   lipase/protease/amylase  12,000 Units Oral TID WC   oseltamivir  75 mg Oral BID   pantoprazole  40 mg Oral Daily   rosuvastatin  20 mg Oral Daily   sertraline  50 mg Oral Daily   tamsulosin  0.4 mg Oral QPC supper    Consultants:  Neurology  Procedures: None  Antimicrobials: Tamiflu  Culture/Microbiology    Component Value Date/Time   SDES  04/20/2021 1830    URINE, CLEAN CATCH Performed at [facility], [address], [place], [place] [address]    [facility]  04/20/2021 1830    NONE Performed at [facility], [place], [place], [place] [address]    CULT MULTIPLE SPECIES PRESENT, SUGGEST RECOLLECTION (A) 04/20/2021 1830   REPTSTATUS 04/22/2021 FINAL 04/20/2021 1830     Data Reviewed: I have personally reviewed the following labs and imaging studies   CBC: Recent Labs  Lab 08/09/21 0828 08/09/21 0925 08/10/21 0323 08/12/21 0342  WBC 5.8  --  6.1 3.9*  NEUTROABS 4.9  --  4.8  --   HGB 12.5* 13.3 12.6* 11.4*  HCT 38.5* 39.0 38.9* 33.3*  MCV 93.2  --  93.3 89.3  PLT 112*  --  PLATELET CLUMPS NOTED ON SMEAR, COUNT APPEARS DECREASED 80*    Basic Metabolic Panel: Recent Labs  Lab 08/09/21 0828 08/09/21 0925 08/10/21 0323 08/12/21 0342  NA 137 139 139 137  K 4.5 3.9 3.5 3.2*  CL 106 105 105 106  CO2 24  --  24 24  GLUCOSE 81 81 75 92  BUN 14 14 15 10   CREATININE 1.20 1.20 1.04 0.83  CALCIUM 9.4  --  9.2 8.8*    GFR: CrCl cannot be calculated (Unknown ideal weight.). Liver Function Tests: Recent Labs  Lab 08/09/21 0828 08/10/21 0323  AST 21 24  ALT 11 14  ALKPHOS 49 50  BILITOT 1.5* 0.9  PROT 6.1* 6.4*  ALBUMIN 3.7 3.6    No results for input(s): LIPASE, AMYLASE in the last 168 hours. No results for  input(s): AMMONIA in the last 168 hours. Coagulation Profile: Recent Labs  Lab 08/09/21 0828  INR 1.1    Cardiac Enzymes: Recent Labs  Lab 08/09/21 0935  CKTOTAL 123    BNP (last 3 results) No results for input(s): PROBNP in the last 8760 hours. HbA1C: Recent Labs    08/10/21 0323  HGBA1C 5.6    CBG: No results for input(s): GLUCAP in the last 168 hours. Lipid Profile: Recent Labs    08/10/21 0323  CHOL 174  HDL 55  LDLCALC 103*  TRIG 79  CHOLHDL 3.2    Thyroid Function Tests: Recent Labs    08/10/21 0323  TSH 1.000    Anemia Panel: No results for input(s): VITAMINB12, FOLATE, FERRITIN, TIBC, IRON, RETICCTPCT in the last 72 hours. Sepsis Labs: No results for input(s): PROCALCITON,  LATICACIDVEN in the last 168 hours.  Recent Results (from the past 240 hour(s))  Resp Panel by RT-PCR (Flu A&B, Covid) Nasopharyngeal Swab     Status: Abnormal   Collection Time: 08/09/21  8:28 AM   Specimen: Nasopharyngeal Swab; Nasopharyngeal(NP) swabs in vial transport medium  Result Value Ref Range Status   SARS Coronavirus 2 by RT PCR NEGATIVE NEGATIVE Final    Comment: (NOTE) SARS-CoV-2 target nucleic acids are NOT DETECTED.  The SARS-CoV-2 RNA is generally detectable in upper respiratory specimens during the acute phase of infection. The lowest concentration of SARS-CoV-2 viral copies this assay can detect is 138 copies/mL. A negative result does not preclude SARS-Cov-2 infection and should not be used as the sole basis for treatment or other patient management decisions. A negative result may occur with  improper specimen collection/handling, submission of specimen other than nasopharyngeal swab, presence of viral mutation(s) within the areas targeted by this assay, and inadequate number of viral copies(<138 copies/mL). A negative result must be combined with clinical observations, patient history, and epidemiological information. The expected result is  Negative.  Fact Sheet for Patients:  EntrepreneurPulse.com.au  Fact Sheet for Healthcare Providers:  IncredibleEmployment.be  This test is no t yet approved or cleared by the [place] FDA and  has been authorized for detection and/or diagnosis of SARS-CoV-2 by FDA under an Emergency Use Authorization (EUA). This EUA will remain  in effect (meaning this test can be used) for the duration of the COVID-19 declaration under Section 564(b)(1) of the Act, 21 U.S.C.section 360bbb-3(b)(1), unless the authorization is terminated  or revoked sooner.       Influenza A by PCR POSITIVE (A) NEGATIVE Final   Influenza B by PCR NEGATIVE NEGATIVE Final    Comment: (NOTE) The Xpert Xpress SARS-CoV-2/FLU/RSV plus assay is intended as an aid in the diagnosis of influenza from Nasopharyngeal swab specimens and should not be used as a sole basis for treatment. Nasal washings and aspirates are unacceptable for Xpert Xpress SARS-CoV-2/FLU/RSV testing.  Fact Sheet for Patients: EntrepreneurPulse.com.au  Fact Sheet for Healthcare Providers: IncredibleEmployment.be  This test is not yet approved or cleared by the [place] FDA and has been authorized for detection and/or diagnosis of SARS-CoV-2 by FDA under an Emergency Use Authorization (EUA). This EUA will remain in effect (meaning this test can be used) for the duration of the COVID-19 declaration under Section 564(b)(1) of the Act, 21 U.S.C. section 360bbb-3(b)(1), unless the authorization is terminated or revoked.  Performed at [facility], [place] [address], [place], [place] [address]       Radiology Studies: No results found.   LOS: 2 days   Flora Lipps, MD [facility] 08/12/2021, 1:00 PM

## 2021-08-12 NOTE — Progress Notes (Addendum)
Physical Therapy Treatment Patient Details Name: Javier [name] MRN: [id number] DOB: [birth year] Today's Date: 08/12/2021   History of Present Illness Pt is an 80 y.o. male who presented 08/09/21 s/p fall with reports of L sided weakness. CT and MRI of head negative for acute intracranial processes. All other imaging also negative 11/11. Pt positive for influenza A. PMHx of "pre" DM II, HLD, HTN, CLL, vascular dementia, remote lacunar stroke, BPH, gastric AVM with weight loss, remote tobacco abuse, anxiety, and Vit D deficiency.    PT Comments    Pt received in supine and pleasantly agreeable to therapy session. Pt is making improvements towards goals as demonstrated by increased distance of household gait tasks, improved balance with less posterior lean when given HHA instead of RW (+2 minA to perform), and improved tolerance for activity this session. Emphasis on upright posture when standing and increasing step length with gait tasks. Continue to recommend SNF upon DC. Pt continues to benefit from PT services to progress toward functional mobility goals.       Recommendations for follow up therapy are one component of a multi-disciplinary discharge planning process, led by the attending physician.  Recommendations may be updated based on patient status, additional functional criteria and insurance authorization.  Follow Up Recommendations  Skilled nursing-short term rehab (<3 hours/day)     Assistance Recommended at Discharge Frequent or constant Supervision/Assistance  Equipment Recommendations  BSC/3in1;Other (comment);Hospital bed (tub bench)    Recommendations for Other Services       Precautions / Restrictions Precautions Precautions: Fall Precaution Comments: droplet (flu) Restrictions Weight Bearing Restrictions: No     Mobility  Bed Mobility Overal bed mobility: Needs Assistance Bed Mobility: Supine to Sit    Supine to sit: Min assist;HOB elevated Sit to supine: Min  assist;HOB elevated   General bed mobility comments: Pt needing min A to scoot forward to EOB, use of rails    Transfers Overall transfer level: Needs assistance Equipment used: 2 person hand held assist;1 person hand held assist Transfers: Sit to/from Stand;Bed to chair/wheelchair/BSC Sit to Stand: Mod assist;Min assist;+2 physical assistance     General transfer comment: Pt requiring up to modA +2 this session for steadying upon standing, cues to lean forward when standing and for upright posture, able to perform reciprocal STS without HHA    Ambulation/Gait Ambulation/Gait assistance: Min assist;+2 physical assistance Gait Distance (Feet): 20 Feet (20 ft, rest break, 20 ft) Assistive device: 2 person hand held assist Gait Pattern/deviations: Decreased stride length;Trunk flexed;Leaning posteriorly Gait velocity: decreased     General Gait Details: Pt with improved sequencing for shifting weight and less posterior lean this session, requires cues for upright posture and increasing step length     Modified Rankin (Stroke Patients Only) Modified Rankin (Stroke Patients Only) Pre-Morbid Rankin Score: Moderate disability Modified Rankin: Moderately severe disability     Balance Overall balance assessment: Needs assistance;History of Falls Sitting-balance support: No upper extremity supported;Feet supported Sitting balance-Leahy Scale: Good   Postural control: Posterior lean Standing balance support: Reliant on assistive device for balance;Bilateral upper extremity supported;During functional activity (AD = HHA in this case) Standing balance-Leahy Scale: Poor Standing balance comment: up to minA +1 HHA for static standing balance, minA +2 HHA for dynamic standing balance      Cognition Arousal/Alertness: Awake/alert Behavior During Therapy: WFL for tasks assessed/performed Overall Cognitive Status: History of cognitive impairments - at baseline   General Comments: Pt  with hx of dementia. Pt with poor awareness into  his deficits and problem-solving how to correct them but with some improvements today when cued.      Exercises Other Exercises Other Exercises: STS x5 from EOB    General Comments General comments (skin integrity, edema, etc.): Pt able to stand and wash hands without leaning trunk on edge of sink; modified RPE 5/10 at end of session      Pertinent Vitals/Pain Pain Assessment: No/denies pain Pain Intervention(s): Monitored during session     PT Goals (current goals can now be found in the care plan section) Acute Rehab PT Goals Patient Stated Goal: to get stronger before going home PT Goal Formulation: With patient Time For Goal Achievement: 08/23/21 Progress towards PT goals: Progressing toward goals    Frequency    Min 3X/week      PT Plan Discharge plan needs to be updated       AM-PAC PT "6 Clicks" Mobility   Outcome Measure  Help needed turning from your back to your side while in a flat bed without using bedrails?: A Lot (mod cues for all tasks below) Help needed moving from lying on your back to sitting on the side of a flat bed without using bedrails?: A Lot Help needed moving to and from a bed to a chair (including a wheelchair)?: A Lot Help needed standing up from a chair using your arms (e.g., wheelchair or bedside chair)?: A Lot Help needed to walk in hospital room?: A Lot Help needed climbing 3-5 steps with a railing? : Total 6 Click Score: 11    End of Session Equipment Utilized During Treatment: Gait belt Activity Tolerance: Patient tolerated treatment well;Patient limited by fatigue Patient left: with call bell/phone within reach;Other (comment);with bed alarm set;in bed Nurse Communication: Mobility status PT Visit Diagnosis: Unsteadiness on feet (R26.81);Other abnormalities of gait and mobility (R26.89);Muscle weakness (generalized) (M62.81);History of falling (Z91.81);Difficulty in walking, not  elsewhere classified (R26.2)     Time: 3734-2876 PT Time Calculation (min) (ACUTE ONLY): 15 min  Charges:  $Gait Training: 8-22 mins                     Javier [name], Student PTA CI: Javier P., PTA  Javier [name]

## 2021-08-13 ENCOUNTER — Telehealth: Payer: Self-pay | Admitting: Pharmacist

## 2021-08-13 LAB — MAGNESIUM: Magnesium: 2.2 mg/dL (ref 1.7–2.4)

## 2021-08-13 LAB — CBC
HCT: 32.9 % — ABNORMAL LOW (ref 39.0–52.0)
Hemoglobin: 10.8 g/dL — ABNORMAL LOW (ref 13.0–17.0)
MCH: 29.7 pg (ref 26.0–34.0)
MCHC: 32.8 g/dL (ref 30.0–36.0)
MCV: 90.4 fL (ref 80.0–100.0)
Platelets: 93 10*3/uL — ABNORMAL LOW (ref 150–400)
RBC: 3.64 MIL/uL — ABNORMAL LOW (ref 4.22–5.81)
RDW: 13.6 % (ref 11.5–15.5)
WBC: 3.3 10*3/uL — ABNORMAL LOW (ref 4.0–10.5)
nRBC: 0 % (ref 0.0–0.2)

## 2021-08-13 LAB — BASIC METABOLIC PANEL
Anion gap: 6 (ref 5–15)
BUN: 11 mg/dL (ref 8–23)
CO2: 25 mmol/L (ref 22–32)
Calcium: 9 mg/dL (ref 8.9–10.3)
Chloride: 108 mmol/L (ref 98–111)
Creatinine, Ser: 0.87 mg/dL (ref 0.61–1.24)
GFR, Estimated: >60 mL/min (ref >60)
Glucose, Bld: 110 mg/dL — ABNORMAL HIGH (ref 70–99)
Potassium: 4 mmol/L (ref 3.5–5.1)
Sodium: 139 mmol/L (ref 135–145)

## 2021-08-13 NOTE — Progress Notes (Signed)
Occupational Therapy Treatment Patient Details Name: Javier [name] MRN: [id number] DOB: 1941-06-02 Today's Date: 08/13/2021   History of present illness Pt is an 80 y.o. male who presented 08/09/21 s/p fall with reports of L sided weakness. CT and MRI of head negative for acute intracranial processes. All other imaging also negative 11/11. Pt positive for influenza A. PMHx of "pre" DM II, HLD, HTN, CLL, vascular dementia, remote lacunar stroke, BPH, gastric AVM with weight loss, remote tobacco abuse, anxiety, and Vit D deficiency.   OT comments  Patient received in recliner and eager to participate with therapy.  Patient moved to sink in recliner and stood with min assist. Patient was able to stand at sink, using sink for balance and performed grooming, UB bathing, and bathing perineal area.  Patient performed tasks with 3 stands, tolerating 3 minutes on first stand and 2.5 minutes on other 2 stands with increased time for rest. Patient address doffing and donning socks seated.  Patient used rail on bed to assist with stand pivot transfer back to bed.  Patient making good progress with OT treatment. Acute OT to continue to follow.    Recommendations for follow up therapy are one component of a multi-disciplinary discharge planning process, led by the attending physician.  Recommendations may be updated based on patient status, additional functional criteria and insurance authorization.    Follow Up Recommendations  Skilled nursing-short term rehab (<3 hours/day)    Assistance Recommended at Discharge Frequent or constant Supervision/Assistance  Equipment Recommendations  Other (comment)    Recommendations for Other Services      Precautions / Restrictions Precautions Precautions: Fall Precaution Comments: droplet (flu) Restrictions Weight Bearing Restrictions: No       Mobility Bed Mobility Overal bed mobility: Needs Assistance Bed Mobility: Sit to Supine       Sit to supine:  Min assist;HOB elevated   General bed mobility comments: verbal cues for rail use    Transfers Overall transfer level: Needs assistance Equipment used: None Transfers: Sit to/from Stand;Bed to chair/wheelchair/BSC Sit to Stand: Min assist Stand pivot transfers: Min assist         General transfer comment: stood from recliner and transferred to EOB using bed rail for support     Balance Overall balance assessment: Needs assistance;History of Falls Sitting-balance support: No upper extremity supported;Feet supported Sitting balance-Leahy Scale: Good     Standing balance support: Single extremity supported;During functional activity Standing balance-Leahy Scale: Poor Standing balance comment: able to stand at sink with sink for support to perform self care tasks                           ADL either performed or assessed with clinical judgement   ADL Overall ADL's : Needs assistance/impaired     Grooming: Wash/dry hands;Wash/dry face;Oral care;Minimal assistance;Standing Grooming Details (indicate cue type and reason): stood at sink using sink for support Upper Body Bathing: Minimal assistance;Standing Upper Body Bathing Details (indicate cue type and reason): stood at sink Lower Body Bathing: Moderate assistance;Sit to/from stand Lower Body Bathing Details (indicate cue type and reason): stood at sink     Lower Body Dressing: Minimal assistance;Sitting/lateral leans Lower Body Dressing Details (indicate cue type and reason): donning socks               General ADL Comments: Patient performed standing at sink with sink for support with increased independence    Extremity/Trunk Assessment Upper Extremity Assessment Upper  Extremity Assessment: RUE deficits/detail;LUE deficits/detail RUE Deficits / Details: Unable to flex shoulders above 90 degrees, overall weak and shaky with effort RUE Sensation: WNL LUE Deficits / Details: Unable to flex shoulders above  90 degrees, overall weak and shaky with effort LUE Sensation: WNL LUE Coordination: decreased fine motor;decreased gross motor            Vision   Vision Assessment?: No apparent visual deficits   Perception     Praxis      Cognition Arousal/Alertness: Awake/alert Behavior During Therapy: WFL for tasks assessed/performed Overall Cognitive Status: History of cognitive impairments - at baseline                                 General Comments: patient was uncertain why he was in hospital          Exercises     Shoulder Instructions       General Comments      Pertinent Vitals/ Pain       Pain Assessment: Faces Faces Pain Scale: Hurts a little bit Pain Location: L hip, L knee Pain Descriptors / Indicators: Discomfort;Grimacing Pain Intervention(s): Monitored during session  Home Living                                          Prior Functioning/Environment              Frequency  Min 2X/week        Progress Toward Goals  OT Goals(current goals can now be found in the care plan section)  Progress towards OT goals: Progressing toward goals  Acute Rehab OT Goals Patient Stated Goal: go to rehab OT Goal Formulation: With patient Time For Goal Achievement: 08/24/21 Potential to Achieve Goals: Good ADL Goals Pt Will Perform Grooming: with supervision;standing Pt Will Perform Lower Body Bathing: with min assist;sitting/lateral leans;sit to/from stand Pt Will Perform Lower Body Dressing: with min assist;sitting/lateral leans;sit to/from stand Pt Will Transfer to Toilet: with min assist;stand pivot transfer Pt Will Perform Toileting - Clothing Manipulation and hygiene: with min assist;sitting/lateral leans;sit to/from stand Additional ADL Goal #1: Pt will participate in standing task for 5 mins to work on activity tolerance.  Plan Discharge plan remains appropriate    Co-evaluation                 AM-PAC OT "6  Clicks" Daily Activity     Outcome Measure   Help from another person eating meals?: A Little Help from another person taking care of personal grooming?: A Little Help from another person toileting, which includes using toliet, bedpan, or urinal?: Total Help from another person bathing (including washing, rinsing, drying)?: A Lot Help from another person to put on and taking off regular upper body clothing?: A Little Help from another person to put on and taking off regular lower body clothing?: A Lot 6 Click Score: 14    End of Session Equipment Utilized During Treatment: Gait belt  OT Visit Diagnosis: Unsteadiness on feet (R26.81);Other abnormalities of gait and mobility (R26.89);Muscle weakness (generalized) (M62.81);History of falling (Z91.81)   Activity Tolerance Patient tolerated treatment well   Patient Left in bed;with call bell/phone within reach;with bed alarm set   Nurse Communication Mobility status        Time: 1358-1430 OT Time Calculation (min): 32  min  Charges: OT General Charges $OT Visit: 1 Visit OT Treatments $Self Care/Home Management : 23-37 mins  Lodema Hong, [place]  Pager [phone] Office [phone]   Trixie Dredge 08/13/2021, 3:48 PM

## 2021-08-13 NOTE — Progress Notes (Signed)
PROGRESS NOTE    Javier [name]  IRS:[id number] DOB: [birth year] DOA: 08/09/2021 PCP: Unk Pinto, MD   Brief Narrative/Hospital Course:  Javier [name], 80 y.o. male with past medical history of hypertension, hyperlipidemia, CLL, BPH and prediabetes presented to hospital with generalized weakness for the past 8 months with falls.  There was also mention of left-sided weakness cough and sputum production for last 2 days.  In the ED patient had hemoglobin of 12.5.  Platelets of 112.  Total bilirubin of 1.5.  Patient tested positive for influenza A.  X-ray of the left shoulder without acute findings. 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  Chest x-ray was unremarkable.  Due to concern for stroke, patient was initially admitted to hospital and neurology was consulted.   Assessment & Plan:  Fall with left-sided weakness,TIA Thought to be secondary to influenza and possible TIA.  MRI/MRA negative for acute findings.  Hemoglobin A1c of 5.6.  Lipid profile with LDL of 103.MRI of the C-spine shows degenerative changes.    Continue PT, OT.  Awaiting for skilled nursing facility placement for rehab.  Patient was seen by neurology during hospitalization.  Generalized weakness, physical deconditioning/debility.     Continue aspirin, Plavix and statin.  Physical therapy, Occupational Therapy has seen the patient and recommended skilled nursing facility.  Influenza A:  On Tamiflu.  Continue to complete course.  Continue droplet precautions.  No leukocytosis or fever.    Hyperlipidemia,Hypertension: continue Crestor.  Patient is on losartan HCTZ as outpatient.  Has been resumed on losartan at this time.   Vascular dementia, Anxiety:  On BuSpar and Zoloft.  Continue delirium precautions   History of CLL,Thrombocytopenia: Patient follows up with Dr. Irene Limbo, oncology as outpatient.  No evidence of bleeding.   Latest platelet count of 80 K  BPH on Flomax   Hypokalemia.  Improved after replacement.  Potassium of 4.0.  Disposition.  At this time, patient is medically stable for disposition to skilled nursing facility.  DVT prophylaxis: enoxaparin (LOVENOX) injection 40 mg Start: 08/09/21 1230  Code Status: Full code  Family Communication:  Spoke with the patient's spouse Ms. [name] on the phone on 08/12/2021 Status is: Inpatient  Patient remains inpatient due to physical deconditioning, debility, influenza, need for skilled nursing facility placement.  Subjective: Today, patient was seen and examined at bedside.  Complains of mild cough otherwise okay.  No pain, fever chills or rigor.    Objective:  Vitals:   08/12/21 2118 08/13/21 0110 08/13/21 0400 08/13/21 0850  BP: 118/64 126/62 128/74 126/64  Pulse: 64 65 68   Resp: 18 16 16    Temp: 98.7 F (37.1 C) 98.1 F (36.7 C) 98.3 F (36.8 C) 98 F (36.7 C)  TempSrc:   Oral   SpO2: 98% 97% 98% 100%   Weight change:   Intake/Output Summary (Last 24 hours) at 08/13/2021 1102 Last data filed at 08/13/2021 0851 Gross per 24 hour  Intake --  Output 1450 ml  Net -1450 ml    Net IO Since Admission: -1,670 mL [08/13/21 1102]   There is no height or weight on file to calculate BMI.   Physical Examination: General:  Average built, not in obvious distress HENT:   No scleral pallor or icterus noted. Oral mucosa is moist.  Chest:  Clear breath sounds.   No crackles or wheezes.  CVS: S1 &S2 heard. No murmur.  Regular rate and rhythm. Abdomen: Soft, nontender, nondistended.  Bowel sounds are heard.   Extremities: No cyanosis, clubbing or edema.  Peripheral pulses are palpable. Psych: Alert, awake and oriented, normal mood CNS:  No cranial nerve deficits.  Power equal in all extremities.   Skin: Warm and dry.  No rashes noted.   Medications reviewed: Scheduled Meds:   aspirin EC  81 mg Oral Daily   busPIRone  5 mg Oral TID    cholecalciferol  5,000 Units Oral Daily   clopidogrel  75 mg Oral Daily   enoxaparin (LOVENOX) injection  40 mg Subcutaneous Daily   guaiFENesin  600 mg Oral BID   lipase/protease/amylase  12,000 Units Oral TID WC   losartan  100 mg Oral Daily   oseltamivir  75 mg Oral BID   pantoprazole  40 mg Oral Daily   rosuvastatin  20 mg Oral Daily   sertraline  50 mg Oral Daily   tamsulosin  0.4 mg Oral QPC supper    Consultants:  Neurology  Procedures: None  Antimicrobials: Tamiflu  Culture/Microbiology    Component Value Date/Time   SDES  04/20/2021 1830    URINE, CLEAN CATCH Performed at [facility], [address] [place], [place] [address]    [facility]  04/20/2021 1830    NONE Performed at [facility], [address] [place], [place] [address]    CULT MULTIPLE SPECIES PRESENT, SUGGEST RECOLLECTION (A) 04/20/2021 1830   REPTSTATUS 04/22/2021 FINAL 04/20/2021 1830     Data Reviewed: I have personally reviewed the following labs and imaging studies   CBC: Recent Labs  Lab 08/09/21 0828 08/09/21 0925 08/10/21 0323 08/12/21 0342 08/13/21 0220  WBC 5.8  --  6.1 3.9* 3.3*  NEUTROABS 4.9  --  4.8  --   --   HGB 12.5* 13.3 12.6* 11.4* 10.8*  HCT 38.5* 39.0 38.9* 33.3* 32.9*  MCV 93.2  --  93.3 89.3 90.4  PLT 112*  --  PLATELET CLUMPS NOTED ON SMEAR, COUNT APPEARS DECREASED 80* 93*    Basic Metabolic Panel: Recent Labs  Lab 08/09/21 0828 08/09/21 0925 08/10/21 0323 08/12/21 0342 08/13/21 0220  NA 137 139 139 137 139  K 4.5 3.9 3.5 3.2* 4.0  CL 106 105 105 106 108  CO2 24  --  24 24 25   GLUCOSE 81 81 75 92 110*  BUN 14 14 15 10 11   CREATININE 1.20 1.20 1.04 0.83 0.87  CALCIUM 9.4  --  9.2 8.8* 9.0  MG  --   --   --   --  2.2    GFR: CrCl cannot be calculated (Unknown ideal weight.). Liver Function Tests: Recent Labs  Lab 08/09/21 0828 08/10/21 0323  AST 21 24  ALT 11 14  ALKPHOS 49 50  BILITOT 1.5* 0.9   PROT 6.1* 6.4*  ALBUMIN 3.7 3.6    No results for input(s): LIPASE, AMYLASE in the last 168 hours. No results for input(s): AMMONIA in the last 168 hours. Coagulation Profile: Recent Labs  Lab 08/09/21 0828  INR 1.1    Cardiac Enzymes: Recent Labs  Lab 08/09/21 0935  CKTOTAL 123    BNP (last 3 results) No results for input(s): PROBNP in the last 8760 hours. HbA1C: No results for input(s): HGBA1C in the last 72 hours.  CBG: No results for input(s): GLUCAP in the last 168 hours. Lipid Profile: No results for input(s): CHOL, HDL, LDLCALC, TRIG, CHOLHDL, LDLDIRECT in the last 72 hours.  Thyroid Function Tests: No results for input(s): TSH, T4TOTAL, FREET4, T3FREE, THYROIDAB in the last 72 hours.  Anemia Panel: No results for input(s): VITAMINB12, FOLATE, FERRITIN, TIBC, IRON, RETICCTPCT in the last 72 hours. Sepsis Labs: No results for input(s): PROCALCITON, LATICACIDVEN in the last 168 hours.  Recent Results (from the past 240 hour(s))  Resp Panel by RT-PCR (Flu A&B, Covid) Nasopharyngeal Swab     Status: Abnormal   Collection Time: 08/09/21  8:28 AM   Specimen: Nasopharyngeal Swab; Nasopharyngeal(NP) swabs in vial transport medium  Result Value Ref Range Status   SARS Coronavirus 2 by RT PCR NEGATIVE NEGATIVE Final    Comment: (NOTE) SARS-CoV-2 target nucleic acids are NOT DETECTED.  The SARS-CoV-2 RNA is generally detectable in upper respiratory specimens during the acute phase of infection. The lowest concentration of SARS-CoV-2 viral copies this assay can detect is 138 copies/mL. A negative result does not preclude SARS-Cov-2 infection and should not be used as the sole basis for treatment or other patient management decisions. A negative result may occur with  improper specimen collection/handling, submission of specimen other than nasopharyngeal swab, presence of viral mutation(s) within the areas targeted by this assay, and inadequate number of  viral copies(<138 copies/mL). A negative result must be combined with clinical observations, patient history, and epidemiological information. The expected result is Negative.  Fact Sheet for Patients:  EntrepreneurPulse.com.au  Fact Sheet for Healthcare Providers:  IncredibleEmployment.be  This test is no t yet approved or cleared by the [place] FDA and  has been authorized for detection and/or diagnosis of SARS-CoV-2 by FDA under an Emergency Use Authorization (EUA). This EUA will remain  in effect (meaning this test can be used) for the duration of the COVID-19 declaration under Section 564(b)(1) of the Act, 21 U.S.C.section 360bbb-3(b)(1), unless the authorization is terminated  or revoked sooner.       Influenza A by PCR POSITIVE (A) NEGATIVE Final   Influenza B by PCR NEGATIVE NEGATIVE Final    Comment: (NOTE) The Xpert Xpress SARS-CoV-2/FLU/RSV plus assay is intended as an aid in the diagnosis of influenza from Nasopharyngeal swab specimens and should not be used as a sole basis for treatment. Nasal washings and aspirates are unacceptable for Xpert Xpress SARS-CoV-2/FLU/RSV testing.  Fact Sheet for Patients: EntrepreneurPulse.com.au  Fact Sheet for Healthcare Providers: IncredibleEmployment.be  This test is not yet approved or cleared by the [place] FDA and has been authorized for detection and/or diagnosis of SARS-CoV-2 by FDA under an Emergency Use Authorization (EUA). This EUA will remain in effect (meaning this test can be used) for the duration of the COVID-19 declaration under Section 564(b)(1) of the Act, 21 U.S.C. section 360bbb-3(b)(1), unless the authorization is terminated or revoked.  Performed at [facility], [place] [address], [place], [place] [address]       Radiology Studies: No results found.   LOS: 3 days   Flora Lipps, MD [facility] 08/13/2021, 11:02 AM

## 2021-08-14 NOTE — Progress Notes (Signed)
Occupational Therapy Treatment Patient Details Name: Javier [name] MRN: [id number] DOB: Mar 31, 1941 Today's Date: 08/14/2021   History of present illness Pt is an 80 y.o. male who presented 08/09/21 s/p fall with reports of L sided weakness. CT and MRI of head negative for acute intracranial processes. All other imaging also negative 11/11. Pt positive for influenza A. PMHx of "pre" DM II, HLD, HTN, CLL, vascular dementia, remote lacunar stroke, BPH, gastric AVM with weight loss, remote tobacco abuse, anxiety, and Vit D deficiency.   OT comments  Patient continues to make good gains with OT treatment. Patient able to get to EOB with verbal cues and min assist. Patient bathed feed and donned socks while seated on EOB. Patient asked to go to bathroom and began to stand unassisted and was educated to have assistance. Patient ambulated to bathroom with RW and performed toilet hygiene seated and standing. Patient completed self care tasks standing at sink requiring seated rest break following 2 minutes of standing. Patient left in recliner with family present. Acute OT to continue to follow.    Recommendations for follow up therapy are one component of a multi-disciplinary discharge planning process, led by the attending physician.  Recommendations may be updated based on patient status, additional functional criteria and insurance authorization.    Follow Up Recommendations  Skilled nursing-short term rehab (<3 hours/day)    Assistance Recommended at Discharge Frequent or constant Supervision/Assistance  Equipment Recommendations  Other (comment)    Recommendations for Other Services      Precautions / Restrictions Precautions Precautions: Fall Precaution Comments: droplet (flu) Restrictions Weight Bearing Restrictions: No       Mobility Bed Mobility Overal bed mobility: Needs Assistance Bed Mobility: Sit to Supine     Supine to sit: Min assist;HOB elevated     General bed  mobility comments: verbal cues for rail use    Transfers Overall transfer level: Needs assistance Equipment used: Rolling walker (2 wheels) Transfers: Bed to chair/wheelchair/BSC Sit to Stand: Min assist           General transfer comment: rw used today with transfers and improved balance noted     Balance Overall balance assessment: Needs assistance;History of Falls Sitting-balance support: No upper extremity supported;Feet supported Sitting balance-Leahy Scale: Good Sitting balance - Comments: able to donn socks seated on EOB   Standing balance support: Single extremity supported;During functional activity Standing balance-Leahy Scale: Poor Standing balance comment: able to stand at sink with sink for support to perform self care tasks                           ADL either performed or assessed with clinical judgement   ADL Overall ADL's : Needs assistance/impaired     Grooming: Wash/dry hands;Wash/dry face;Oral care;Minimal assistance;Standing Grooming Details (indicate cue type and reason): assistance needed opening toothpaste     Lower Body Bathing: Moderate assistance;Sit to/from stand Lower Body Bathing Details (indicate cue type and reason): bathed feet seated and periarea while standing with assistance for balance Upper Body Dressing : Min guard;Sitting Upper Body Dressing Details (indicate cue type and reason): changed gown Lower Body Dressing: Minimal assistance;Sitting/lateral leans Lower Body Dressing Details (indicate cue type and reason): donning socks Toilet Transfer: Minimal assistance;Rolling walker (2 wheels);Regular Glass blower/designer Details (indicate cue type and reason): cues for safety and assistance with balance Toileting- Clothing Manipulation and Hygiene: Minimal assistance;Sit to/from stand;Sitting/lateral lean Toileting - Clothing Manipulation Details (indicate cue type  and reason): performed toilet hygien seated and completed  with washcloth while standing     Functional mobility during ADLs: Minimal assistance;Rolling walker (2 wheels) General ADL Comments: increased mobility with RW but impulsive and requires frequent cues for safety    Extremity/Trunk Assessment Upper Extremity Assessment RUE Deficits / Details: Unable to flex shoulders above 90 degrees, overall weak and shaky with effort RUE Sensation: WNL RUE Coordination: decreased fine motor;decreased gross motor LUE Deficits / Details: Unable to flex shoulders above 90 degrees, overall weak and shaky with effort LUE Sensation: WNL LUE Coordination: decreased fine motor;decreased gross motor            Vision       Perception     Praxis      Cognition Arousal/Alertness: Awake/alert Behavior During Therapy: WFL for tasks assessed/performed Overall Cognitive Status: History of cognitive impairments - at baseline                                 General Comments: patient was impulsive today, attempting to stand without assistance          Exercises     Shoulder Instructions       General Comments      Pertinent Vitals/ Pain       Pain Assessment: Faces Faces Pain Scale: Hurts a little bit Pain Location: L hip, L knee Pain Descriptors / Indicators: Discomfort;Grimacing Pain Intervention(s): Monitored during session;Repositioned  Home Living                                          Prior Functioning/Environment              Frequency  Min 2X/week        Progress Toward Goals  OT Goals(current goals can now be found in the care plan section)  Progress towards OT goals: Progressing toward goals  Acute Rehab OT Goals Patient Stated Goal: go to rehab OT Goal Formulation: With patient Time For Goal Achievement: 08/24/21 Potential to Achieve Goals: Good ADL Goals Pt Will Perform Grooming: with supervision;standing Pt Will Perform Lower Body Bathing: with min assist;sitting/lateral  leans;sit to/from stand Pt Will Perform Lower Body Dressing: with min assist;sitting/lateral leans;sit to/from stand Pt Will Transfer to Toilet: with min assist;stand pivot transfer Pt Will Perform Toileting - Clothing Manipulation and hygiene: with min assist;sitting/lateral leans;sit to/from stand Additional ADL Goal #1: Pt will participate in standing task for 5 mins to work on activity tolerance.  Plan Discharge plan remains appropriate    Co-evaluation                 AM-PAC OT "6 Clicks" Daily Activity     Outcome Measure   Help from another person eating meals?: A Little Help from another person taking care of personal grooming?: A Little Help from another person toileting, which includes using toliet, bedpan, or urinal?: A Little Help from another person bathing (including washing, rinsing, drying)?: A Lot Help from another person to put on and taking off regular upper body clothing?: A Little Help from another person to put on and taking off regular lower body clothing?: A Little 6 Click Score: 17    End of Session Equipment Utilized During Treatment: Gait belt;Rolling walker (2 wheels)  OT Visit Diagnosis: Unsteadiness on feet (R26.81);Other abnormalities of  gait and mobility (R26.89);Muscle weakness (generalized) (M62.81);History of falling (Z91.81)   Activity Tolerance Patient tolerated treatment well   Patient Left in chair;with call bell/phone within reach;with chair alarm set;with family/visitor present   Nurse Communication Mobility status        Time: 2751-7001 OT Time Calculation (min): 27 min  Charges: OT General Charges $OT Visit: 1 Visit OT Treatments $Self Care/Home Management : 23-37 mins  Lodema Hong, [place]  Pager [phone] Office [place] 08/14/2021, 11:59 AM

## 2021-08-14 NOTE — Progress Notes (Addendum)
PROGRESS NOTE    Javier [name]  MVE:[id number] DOB: 1941/05/03 DOA: 08/09/2021 PCP: Unk Pinto, MD   Brief Narrative/Hospital Course:  Javier [name], 80 y.o. male with past medical history of hypertension, hyperlipidemia, CLL, BPH and prediabetes presented to hospital with generalized weakness for the past 8 months with falls.  There was also mention of left-sided weakness cough and sputum production for last 2 days.  In the ED patient had hemoglobin of 12.5.  Platelets of 112.  Total bilirubin of 1.5.  Patient tested positive for influenza A.  X-ray of the left shoulder without acute findings. 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  Chest x-ray was unremarkable.  Due to concern for stroke, patient was initially admitted to hospital and neurology was consulted.   Assessment & Plan:  Fall with left-sided weakness,TIA Thought to be secondary to influenza and possible TIA.  MRI/MRA negative for acute findings.  Seen by neurology during hospitalization.  Hemoglobin A1c of 5.6.  Lipid profile with LDL of 103. MRI of the C-spine shows degenerative changes.    Continue PT, OT.  Awaiting for skilled nursing facility placement for rehab.    Generalized weakness, physical deconditioning/debility.    Continue aspirin, Plavix and statin.  Physical therapy, occupational Therapy has seen the patient and recommended skilled nursing facility.  Influenza A:  On Tamiflu.  Continue to complete course.  Continue droplet precautions.  No leukocytosis or fever.    Hyperlipidemia, Essential hypertension: continue Crestor.  Patient is on losartan HCTZ as outpatient.  Has been resumed on losartan at this time.   Vascular dementia, Anxiety:  On BuSpar and Zoloft.   History of CLL,Thrombocytopenia: Patient follows up with Dr. Irene Limbo, oncology as outpatient.  No evidence of bleeding.  Latest platelet count of 93  K  BPH on Flomax   Hypokalemia.  Improved after replacement.  Potassium of 4.0.  Disposition.  At this time, patient is medically stable for disposition to skilled nursing facility. TOC aware.  As Per the previous discussion, facility is unable to take the patient to SNF until off isolation.  DVT prophylaxis: enoxaparin (LOVENOX) injection 40 mg Start: 08/09/21 1230  Code Status: Full code  Family Communication:  Spoke with the patient's spouse Ms. [name] on the phone on 08/12/2021  Status is: Inpatient  Patient remains inpatient due to physical deconditioning, debility, influenza, need for skilled nursing facility placement.  Subjective: Today, patient was seen and examined at bedside. Patient overall feels okay.  Has mild cough.  Objective:  Vitals:   08/13/21 2055 08/14/21 0017 08/14/21 0339 08/14/21 0742  BP: 130/66 132/67 (!) 146/70 136/75  Pulse: 60 (!) 57 64 (!) 59  Resp: 20 17 18 18   Temp: 98.6 F (37 C) 98.8 F (37.1 C) 98.6 F (37 C) 98.1 F (36.7 C)  TempSrc: Oral Oral Oral   SpO2: 97% 99% 99% 99%   Weight change:   Intake/Output Summary (Last 24 hours) at 08/14/2021 1152 Last data filed at 08/14/2021 1002 Gross per 24 hour  Intake --  Output 1625 ml  Net -1625 ml    Net IO Since Admission: -3,295 mL [08/14/21 1152]   There is no height or weight on file to calculate BMI.   Physical Examination: General:  Average built, not in obvious distress HENT:   No scleral pallor or icterus noted. Oral mucosa is moist.  Chest:  Clear breath sounds.  Diminished breath sounds bilaterally. No crackles or wheezes.  CVS: S1 &S2 heard. No murmur.  Regular rate and rhythm. Abdomen: Soft, nontender, nondistended.  Bowel sounds are heard.   Extremities: No cyanosis, clubbing or edema.  Peripheral pulses are palpable. Psych: Alert, awake and oriented, normal mood CNS:  No cranial nerve deficits.  Power equal in all extremities.   Skin: Warm and dry.  No rashes  noted.   Medications reviewed: Scheduled Meds:   aspirin EC  81 mg Oral Daily   busPIRone  5 mg Oral TID   cholecalciferol  5,000 Units Oral Daily   clopidogrel  75 mg Oral Daily   enoxaparin (LOVENOX) injection  40 mg Subcutaneous Daily   guaiFENesin  600 mg Oral BID   lipase/protease/amylase  12,000 Units Oral TID WC   losartan  100 mg Oral Daily   pantoprazole  40 mg Oral Daily   rosuvastatin  20 mg Oral Daily   sertraline  50 mg Oral Daily   tamsulosin  0.4 mg Oral QPC supper    Consultants:  Neurology  Procedures: None  Antimicrobials: Tamiflu  Culture/Microbiology    Component Value Date/Time   SDES  04/20/2021 1830    URINE, CLEAN CATCH Performed at [facility], [address], [place], [place] [address]    [facility]  04/20/2021 1830    NONE Performed at [facility], [address] [place], [place] [address]    CULT MULTIPLE SPECIES PRESENT, SUGGEST RECOLLECTION (A) 04/20/2021 1830   REPTSTATUS 04/22/2021 FINAL 04/20/2021 1830     Data Reviewed: I have personally reviewed the following labs and imaging studies   CBC: Recent Labs  Lab 08/09/21 0828 08/09/21 0925 08/10/21 0323 08/12/21 0342 08/13/21 0220  WBC 5.8  --  6.1 3.9* 3.3*  NEUTROABS 4.9  --  4.8  --   --   HGB 12.5* 13.3 12.6* 11.4* 10.8*  HCT 38.5* 39.0 38.9* 33.3* 32.9*  MCV 93.2  --  93.3 89.3 90.4  PLT 112*  --  PLATELET CLUMPS NOTED ON SMEAR, COUNT APPEARS DECREASED 80* 93*    Basic Metabolic Panel: Recent Labs  Lab 08/09/21 0828 08/09/21 0925 08/10/21 0323 08/12/21 0342 08/13/21 0220  NA 137 139 139 137 139  K 4.5 3.9 3.5 3.2* 4.0  CL 106 105 105 106 108  CO2 24  --  24 24 25   GLUCOSE 81 81 75 92 110*  BUN 14 14 15 10 11   CREATININE 1.20 1.20 1.04 0.83 0.87  CALCIUM 9.4  --  9.2 8.8* 9.0  MG  --   --   --   --  2.2    GFR: CrCl cannot be calculated (Unknown ideal weight.). Liver Function Tests: Recent Labs  Lab  08/09/21 0828 08/10/21 0323  AST 21 24  ALT 11 14  ALKPHOS 49 50  BILITOT 1.5* 0.9  PROT 6.1* 6.4*  ALBUMIN 3.7 3.6    No results for input(s): LIPASE, AMYLASE in the last 168 hours. No results for input(s): AMMONIA in the last 168 hours. Coagulation Profile: Recent Labs  Lab 08/09/21 0828  INR 1.1    Cardiac Enzymes: Recent Labs  Lab 08/09/21 0935  CKTOTAL 123    BNP (last 3 results) No results for input(s): PROBNP in the last 8760 hours. HbA1C: No results for input(s): HGBA1C in the last 72 hours.  CBG: No results for input(s): GLUCAP in the last 168 hours. Lipid Profile: No results for input(s): CHOL, HDL, LDLCALC, TRIG,  CHOLHDL, LDLDIRECT in the last 72 hours.  Thyroid Function Tests: No results for input(s): TSH, T4TOTAL, FREET4, T3FREE, THYROIDAB in the last 72 hours.  Anemia Panel: No results for input(s): VITAMINB12, FOLATE, FERRITIN, TIBC, IRON, RETICCTPCT in the last 72 hours. Sepsis Labs: No results for input(s): PROCALCITON, LATICACIDVEN in the last 168 hours.  Recent Results (from the past 240 hour(s))  Resp Panel by RT-PCR (Flu A&B, Covid) Nasopharyngeal Swab     Status: Abnormal   Collection Time: 08/09/21  8:28 AM   Specimen: Nasopharyngeal Swab; Nasopharyngeal(NP) swabs in vial transport medium  Result Value Ref Range Status   SARS Coronavirus 2 by RT PCR NEGATIVE NEGATIVE Final    Comment: (NOTE) SARS-CoV-2 target nucleic acids are NOT DETECTED.  The SARS-CoV-2 RNA is generally detectable in upper respiratory specimens during the acute phase of infection. The lowest concentration of SARS-CoV-2 viral copies this assay can detect is 138 copies/mL. A negative result does not preclude SARS-Cov-2 infection and should not be used as the sole basis for treatment or other patient management decisions. A negative result may occur with  improper specimen collection/handling, submission of specimen other than nasopharyngeal swab, presence of viral  mutation(s) within the areas targeted by this assay, and inadequate number of viral copies(<138 copies/mL). A negative result must be combined with clinical observations, patient history, and epidemiological information. The expected result is Negative.  Fact Sheet for Patients:  EntrepreneurPulse.com.au  Fact Sheet for Healthcare Providers:  IncredibleEmployment.be  This test is no t yet approved or cleared by the [place] FDA and  has been authorized for detection and/or diagnosis of SARS-CoV-2 by FDA under an Emergency Use Authorization (EUA). This EUA will remain  in effect (meaning this test can be used) for the duration of the COVID-19 declaration under Section 564(b)(1) of the Act, 21 U.S.C.section 360bbb-3(b)(1), unless the authorization is terminated  or revoked sooner.       Influenza A by PCR POSITIVE (A) NEGATIVE Final   Influenza B by PCR NEGATIVE NEGATIVE Final    Comment: (NOTE) The Xpert Xpress SARS-CoV-2/FLU/RSV plus assay is intended as an aid in the diagnosis of influenza from Nasopharyngeal swab specimens and should not be used as a sole basis for treatment. Nasal washings and aspirates are unacceptable for Xpert Xpress SARS-CoV-2/FLU/RSV testing.  Fact Sheet for Patients: EntrepreneurPulse.com.au  Fact Sheet for Healthcare Providers: IncredibleEmployment.be  This test is not yet approved or cleared by the [place] FDA and has been authorized for detection and/or diagnosis of SARS-CoV-2 by FDA under an Emergency Use Authorization (EUA). This EUA will remain in effect (meaning this test can be used) for the duration of the COVID-19 declaration under Section 564(b)(1) of the Act, 21 U.S.C. section 360bbb-3(b)(1), unless the authorization is terminated or revoked.  Performed at [facility], [place] [address] [place], [place] [address]       Radiology Studies: No  results found.   LOS: 4 days   Flora Lipps, MD [facility] 08/14/2021, 11:52 AM

## 2021-08-14 NOTE — Progress Notes (Addendum)
Physical Therapy Treatment Patient Details Name: Javier [name] MRN: [id number] DOB: May 23, 1941 Today's Date: 08/14/2021   History of Present Illness Pt is an 80 y.o. male who presented 08/09/21 s/p fall with reports of L sided weakness. CT and MRI of head negative for acute intracranial processes. All other imaging also negative 11/11. Pt positive for influenza A. PMHx of "pre" DM II, HLD, HTN, CLL, vascular dementia, remote lacunar stroke, BPH, gastric AVM with weight loss, remote tobacco abuse, anxiety, and Vit D deficiency.    PT Comments    Pt received in supine and agreeable to therapy session. Pts wife, [name], present and supportive throughout. Emphasis on safety with household distance gait tasks, moderate cues needed for step sequencing and upright posture. Pt demonstrates impulsivity and posterior lean while standing and ambulating, increasing risk for falls. Continue to recommend SNF upon DC to improve strength and functional mobility. Pt continues to benefit from PT services to progress toward functional mobility goals.      Recommendations for follow up therapy are one component of a multi-disciplinary discharge planning process, led by the attending physician.  Recommendations may be updated based on patient status, additional functional criteria and insurance authorization.  Follow Up Recommendations  Skilled nursing-short term rehab (<3 hours/day)     Assistance Recommended at Discharge Frequent or constant Supervision/Assistance  Equipment Recommendations  BSC/3in1;Other (comment);Hospital bed (tub bench)    Recommendations for Other Services       Precautions / Restrictions Precautions Precautions: Fall Precaution Comments: droplet (flu) Restrictions Weight Bearing Restrictions: No     Mobility  Bed Mobility Overal bed mobility: Needs Assistance Bed Mobility: Supine to Sit   Supine to sit: Min assist;HOB elevated    General bed mobility comments: minA for  BLE management and use of rail    Transfers Overall transfer level: Needs assistance Equipment used: 2 person hand held assist;1 person hand held assist;Rolling walker (2 wheels) Transfers: Sit to/from Stand;Bed to chair/wheelchair/BSC Sit to Stand: Mod assist;Min assist;+2 physical assistance   General transfer comment: Pt requiring up to modA +2 this session for steadying upon standing, verbal cues to lean forward and for upright posture, able to perform reciprocal STS without HHA    Ambulation/Gait Ambulation/Gait assistance: Min assist;+2 physical assistance Gait Distance (Feet): 15 Feet (15 ft, standing break, 15 ft) Assistive device: 2 person hand held assist;Rolling walker (2 wheels) Gait Pattern/deviations: Decreased stride length;Trunk flexed;Leaning posteriorly;Step-to pattern Gait velocity: decreased     General Gait Details: Initially attempted gait with RW, pt with difficulty sequencing steps while using RW, changed to HHA with improved step sequencing, cues for upright posture and leaning forward      Modified Rankin (Stroke Patients Only) Modified Rankin (Stroke Patients Only) Pre-Morbid Rankin Score: Moderate disability Modified Rankin: Moderately severe disability     Balance Overall balance assessment: Needs assistance;History of Falls Sitting-balance support: No upper extremity supported;Feet supported Sitting balance-Leahy Scale: Good   Postural control: Posterior lean Standing balance support: Reliant on assistive device for balance;Bilateral upper extremity supported;During functional activity (Reliant on HHA for standing balance) Standing balance-Leahy Scale: Poor Standing balance comment: up to minA +1 HHA for static standing balance, minA +2 HHA for dynamic standing balance      Cognition Arousal/Alertness: Awake/alert Behavior During Therapy: WFL for tasks assessed/performed Overall Cognitive Status: History of cognitive impairments - at  baseline   General Comments: Pt slightly impulsive, standing before cued, does well with multimodal cuing       Exercises Other  Exercises Other Exercises: STS x5 from EOB Other Exercises: Seated: LAQ x10, ankle pumps x10, marching x10, heel raises x5        Pertinent Vitals/Pain Pain Assessment: No/denies pain     PT Goals (current goals can now be found in the care plan section) Acute Rehab PT Goals Patient Stated Goal: to get stronger before going home PT Goal Formulation: With patient Time For Goal Achievement: 08/23/21    Frequency    Min 3X/week      PT Plan Discharge plan needs to be updated       AM-PAC PT "6 Clicks" Mobility   Outcome Measure  Help needed turning from your back to your side while in a flat bed without using bedrails?: A Lot (mod+ cues for all tasks below) Help needed moving from lying on your back to sitting on the side of a flat bed without using bedrails?: A Lot Help needed moving to and from a bed to a chair (including a wheelchair)?: A Lot Help needed standing up from a chair using your arms (e.g., wheelchair or bedside chair)?: A Lot Help needed to walk in hospital room?: A Lot Help needed climbing 3-5 steps with a railing? : Total 6 Click Score: 11    End of Session Equipment Utilized During Treatment: Gait belt Activity Tolerance: Patient tolerated treatment well;Patient limited by fatigue Patient left: with family/visitor present;in chair;with call bell/phone within reach;with chair alarm set (wife, [name], present) Nurse Communication: Mobility status PT Visit Diagnosis: Unsteadiness on feet (R26.81);Other abnormalities of gait and mobility (R26.89);Muscle weakness (generalized) (M62.81);History of falling (Z91.81);Difficulty in walking, not elsewhere classified (R26.2)     Time: 5374-8270 PT Time Calculation (min) (ACUTE ONLY): 17 min  Charges:  $Gait Training: 8-22 mins                     Evelene Croon, Student  PTA CI: Carly P., PTA  Carly M Poff 08/14/2021, 5:02 PM

## 2021-08-15 NOTE — Progress Notes (Signed)
Patient reported pain on lower left side. MD was notified.

## 2021-08-15 NOTE — Progress Notes (Signed)
Transferred patient to chair. Alarm is on, call bell in reach and patient's wife is in the room.

## 2021-08-15 NOTE — Plan of Care (Signed)
  Problem: Education: Goal: Ability to demonstrate appropriate child care will improve Outcome: Progressing Goal: Ability to verbalize an understanding of newborn treatment and procedures will improve Outcome: Progressing Goal: Ability to demonstrate an understanding of appropriate nutrition and feeding will improve Outcome: Progressing Goal: Individualized Educational Video(s) Outcome: Progressing   Problem: Nutritional: Goal: Nutritional status of the infant will improve as evidenced by minimal weight loss and appropriate weight gain for gestational age Outcome: Progressing Goal: Ability to maintain a balanced intake and output will improve Outcome: Progressing   Problem: Clinical Measurements: Goal: Ability to maintain clinical measurements within normal limits will improve Outcome: Progressing   Problem: Skin Integrity: Goal: Risk for impaired skin integrity will decrease Outcome: Progressing Goal: Demonstrates signs of wound healing without infection Outcome: Progressing

## 2021-08-15 NOTE — Progress Notes (Signed)
PROGRESS NOTE    Javier [name]  OZD:[id number] DOB: 06/19/1941 DOA: 08/09/2021 PCP: Unk Pinto, MD   Brief Narrative/Hospital Course:  Javier [name], 80 y.o. male with past medical history of hypertension, hyperlipidemia, CLL, BPH and prediabetes presented to hospital with generalized weakness for the past 8 months with falls.  There was also mention of left-sided weakness cough and sputum production for last 2 days.  In the ED patient had hemoglobin of 12.5.  Platelets of 112.  Total bilirubin of 1.5.  Patient tested positive for influenza A.  X-ray of the left shoulder without acute findings. 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  Chest x-ray was unremarkable.  Due to concern for stroke, patient was initially admitted to hospital and neurology was consulted.   Assessment & Plan:  Fall with left-sided weakness,TIA Thought to be secondary to influenza and possible TIA.  MRI/MRA negative for acute findings.  Seen by neurology during hospitalization.  Hemoglobin A1c of 5.6.  Lipid profile with LDL of 103. MRI of the C-spine shows degenerative changes.    Continue PT, OT.  Awaiting for skilled nursing facility placement for rehab.    Generalized weakness, physical deconditioning/debility.    Continue aspirin, Plavix and statin.  Physical therapy, occupational Therapy has seen the patient and recommended skilled nursing facility.  Influenza A:  On Tamiflu.  Continue to complete course.  Continue droplet precautions, total of 7 days..  No leukocytosis or fever.  Has mild cough.  Hyperlipidemia, Essential hypertension: continue Crestor.  Patient is on losartan HCTZ as outpatient.  Has been resumed on losartan at this time.   Vascular dementia, Anxiety:  On BuSpar and Zoloft.   History of CLL,Thrombocytopenia: Patient follows up with Dr. Irene Limbo, oncology as outpatient.  No evidence of bleeding.   Latest platelet count of 93 K  BPH on Flomax   Hypokalemia.  Improved after replacement.  Potassium of 4.0.  Disposition.  At this time, patient is medically stable for disposition to skilled nursing facility. TOC aware.  As Per the previous discussion, facility is unable to take the patient to SNF until off isolation.  DVT prophylaxis: enoxaparin (LOVENOX) injection 40 mg Start: 08/09/21 1230  Code Status: Full code  Family Communication:  Spoke with the patient's spouse Ms. Javier [name] on the phone on 08/12/2021, unable to reach her today.  Status is: Inpatient  Patient remains inpatient due to physical deconditioning, debility, influenza, need for skilled nursing facility placement.  Subjective: Today, patient was seen and examined at bedside.  Complains of mild pain over his left side of the body from his recent fall.  Objective:  Vitals:   08/14/21 2015 08/14/21 2344 08/15/21 0507 08/15/21 0756  BP: 131/71 122/71 137/68 137/75  Pulse: 62 63 64 64  Resp: 19  18 14   Temp: 97.8 F (36.6 C) 97.9 F (36.6 C) 97.8 F (36.6 C) 97.7 F (36.5 C)  TempSrc: Oral Oral Oral Oral  SpO2: 98% 97% 98% 100%   Weight change:   Intake/Output Summary (Last 24 hours) at 08/15/2021 1139 Last data filed at 08/15/2021 1012 Gross per 24 hour  Intake 160 ml  Output 1125 ml  Net -965 ml    Net IO Since Admission: -4,260 mL [08/15/21 1139]   There is no height or weight on file to calculate BMI.   Physical Examination: General:  Average built, not in obvious distress HENT:  No scleral pallor or icterus noted. Oral mucosa is moist.  Chest:  Clear breath sounds.  Diminished breath sounds bilaterally. No crackles or wheezes.  CVS: S1 &S2 heard. No murmur.  Regular rate and rhythm. Abdomen: Soft, nontender, nondistended.  Bowel sounds are heard.   Extremities: No cyanosis, clubbing or edema.  Peripheral pulses are palpable.  Moving all extremities. Psych: Alert, awake and oriented, normal  mood CNS:  No cranial nerve deficits.  Power equal in all extremities.  Nonspecific tenderness over the left side of his body including the chest lateral abdomen and thigh and hip area Skin: Warm and dry.  No rashes noted.   Medications reviewed: Scheduled Meds:   aspirin EC  81 mg Oral Daily   busPIRone  5 mg Oral TID   cholecalciferol  5,000 Units Oral Daily   clopidogrel  75 mg Oral Daily   enoxaparin (LOVENOX) injection  40 mg Subcutaneous Daily   guaiFENesin  600 mg Oral BID   lipase/protease/amylase  12,000 Units Oral TID WC   losartan  100 mg Oral Daily   pantoprazole  40 mg Oral Daily   rosuvastatin  20 mg Oral Daily   sertraline  50 mg Oral Daily   tamsulosin  0.4 mg Oral QPC supper    Consultants:  Neurology  Procedures: None  Antimicrobials: Tamiflu-completed  Culture/Microbiology    Component Value Date/Time   SDES  04/20/2021 1830    URINE, CLEAN CATCH Performed at [facility], [address] [place], [place] [address]    [facility]  04/20/2021 1830    NONE Performed at [facility], [address], [place], [place] [address]    CULT MULTIPLE SPECIES PRESENT, SUGGEST RECOLLECTION (A) 04/20/2021 1830   REPTSTATUS 04/22/2021 FINAL 04/20/2021 1830     Data Reviewed: I have personally reviewed the following labs and imaging studies   CBC: Recent Labs  Lab 08/09/21 0828 08/09/21 0925 08/10/21 0323 08/12/21 0342 08/13/21 0220  WBC 5.8  --  6.1 3.9* 3.3*  NEUTROABS 4.9  --  4.8  --   --   HGB 12.5* 13.3 12.6* 11.4* 10.8*  HCT 38.5* 39.0 38.9* 33.3* 32.9*  MCV 93.2  --  93.3 89.3 90.4  PLT 112*  --  PLATELET CLUMPS NOTED ON SMEAR, COUNT APPEARS DECREASED 80* 93*    Basic Metabolic Panel: Recent Labs  Lab 08/09/21 0828 08/09/21 0925 08/10/21 0323 08/12/21 0342 08/13/21 0220  NA 137 139 139 137 139  K 4.5 3.9 3.5 3.2* 4.0  CL 106 105 105 106 108  CO2 24  --  24 24 25   GLUCOSE 81 81 75 92 110*   BUN 14 14 15 10 11   CREATININE 1.20 1.20 1.04 0.83 0.87  CALCIUM 9.4  --  9.2 8.8* 9.0  MG  --   --   --   --  2.2    GFR: CrCl cannot be calculated (Unknown ideal weight.). Liver Function Tests: Recent Labs  Lab 08/09/21 0828 08/10/21 0323  AST 21 24  ALT 11 14  ALKPHOS 49 50  BILITOT 1.5* 0.9  PROT 6.1* 6.4*  ALBUMIN 3.7 3.6    No results for input(s): LIPASE, AMYLASE in the last 168 hours. No results for input(s): AMMONIA in the last 168 hours. Coagulation Profile: Recent Labs  Lab 08/09/21 0828  INR 1.1    Cardiac Enzymes: Recent Labs  Lab 08/09/21 0935  CKTOTAL 123    BNP (last 3 results) No results for input(s):  PROBNP in the last 8760 hours. HbA1C: No results for input(s): HGBA1C in the last 72 hours.  CBG: No results for input(s): GLUCAP in the last 168 hours. Lipid Profile: No results for input(s): CHOL, HDL, LDLCALC, TRIG, CHOLHDL, LDLDIRECT in the last 72 hours.  Thyroid Function Tests: No results for input(s): TSH, T4TOTAL, FREET4, T3FREE, THYROIDAB in the last 72 hours.  Anemia Panel: No results for input(s): VITAMINB12, FOLATE, FERRITIN, TIBC, IRON, RETICCTPCT in the last 72 hours. Sepsis Labs: No results for input(s): PROCALCITON, LATICACIDVEN in the last 168 hours.  Recent Results (from the past 240 hour(s))  Resp Panel by RT-PCR (Flu A&B, Covid) Nasopharyngeal Swab     Status: Abnormal   Collection Time: 08/09/21  8:28 AM   Specimen: Nasopharyngeal Swab; Nasopharyngeal(NP) swabs in vial transport medium  Result Value Ref Range Status   SARS Coronavirus 2 by RT PCR NEGATIVE NEGATIVE Final    Comment: (NOTE) SARS-CoV-2 target nucleic acids are NOT DETECTED.  The SARS-CoV-2 RNA is generally detectable in upper respiratory specimens during the acute phase of infection. The lowest concentration of SARS-CoV-2 viral copies this assay can detect is 138 copies/mL. A negative result does not preclude SARS-Cov-2 infection and should not be  used as the sole basis for treatment or other patient management decisions. A negative result may occur with  improper specimen collection/handling, submission of specimen other than nasopharyngeal swab, presence of viral mutation(s) within the areas targeted by this assay, and inadequate number of viral copies(<138 copies/mL). A negative result must be combined with clinical observations, patient history, and epidemiological information. The expected result is Negative.  Fact Sheet for Patients:  EntrepreneurPulse.com.au  Fact Sheet for Healthcare Providers:  IncredibleEmployment.be  This test is no t yet approved or cleared by the [place] FDA and  has been authorized for detection and/or diagnosis of SARS-CoV-2 by FDA under an Emergency Use Authorization (EUA). This EUA will remain  in effect (meaning this test can be used) for the duration of the COVID-19 declaration under Section 564(b)(1) of the Act, 21 U.S.C.section 360bbb-3(b)(1), unless the authorization is terminated  or revoked sooner.       Influenza A by PCR POSITIVE (A) NEGATIVE Final   Influenza B by PCR NEGATIVE NEGATIVE Final    Comment: (NOTE) The Xpert Xpress SARS-CoV-2/FLU/RSV plus assay is intended as an aid in the diagnosis of influenza from Nasopharyngeal swab specimens and should not be used as a sole basis for treatment. Nasal washings and aspirates are unacceptable for Xpert Xpress SARS-CoV-2/FLU/RSV testing.  Fact Sheet for Patients: EntrepreneurPulse.com.au  Fact Sheet for Healthcare Providers: IncredibleEmployment.be  This test is not yet approved or cleared by the [place] FDA and has been authorized for detection and/or diagnosis of SARS-CoV-2 by FDA under an Emergency Use Authorization (EUA). This EUA will remain in effect (meaning this test can be used) for the duration of the COVID-19 declaration under Section  564(b)(1) of the Act, 21 U.S.C. section 360bbb-3(b)(1), unless the authorization is terminated or revoked.  Performed at [facility], [place] [address], [place], [place] [address]       Radiology Studies: No results found.   LOS: 5 days   Flora Lipps, MD [facility] 08/15/2021, 11:39 AM

## 2021-08-16 LAB — CBC
HCT: 35 % — ABNORMAL LOW (ref 39.0–52.0)
Hemoglobin: 11.4 g/dL — ABNORMAL LOW (ref 13.0–17.0)
MCH: 29.3 pg (ref 26.0–34.0)
MCHC: 32.6 g/dL (ref 30.0–36.0)
MCV: 90 fL (ref 80.0–100.0)
Platelets: 167 10*3/uL (ref 150–400)
RBC: 3.89 MIL/uL — ABNORMAL LOW (ref 4.22–5.81)
RDW: 13.2 % (ref 11.5–15.5)
WBC: 4.8 10*3/uL (ref 4.0–10.5)
nRBC: 0 % (ref 0.0–0.2)

## 2021-08-16 LAB — BASIC METABOLIC PANEL
Anion gap: 7 (ref 5–15)
BUN: 12 mg/dL (ref 8–23)
CO2: 23 mmol/L (ref 22–32)
Calcium: 9.4 mg/dL (ref 8.9–10.3)
Chloride: 106 mmol/L (ref 98–111)
Creatinine, Ser: 0.88 mg/dL (ref 0.61–1.24)
GFR, Estimated: >60 mL/min (ref >60)
Glucose, Bld: 92 mg/dL (ref 70–99)
Potassium: 3.7 mmol/L (ref 3.5–5.1)
Sodium: 136 mmol/L (ref 135–145)

## 2021-08-16 NOTE — Progress Notes (Addendum)
PROGRESS NOTE    Javier [name]  DUK:[id number] DOB: Jan 27, 1941 DOA: 08/09/2021 PCP: Javier Pinto, MD   Brief Narrative/Hospital Course:  Javier [name], 80 y.o. male with past medical history of hypertension, hyperlipidemia, CLL, BPH and prediabetes presented to Javier hospital with generalized weakness for Javier past 8 months with falls.  There was also mention of left-sided weakness, cough and sputum production for last 2 days.  In Javier ED patient had hemoglobin of 12.5.  Platelets of 112.  Total bilirubin of 1.5.  Patient tested positive for influenza A.  X-ray of Javier left shoulder without acute findings. CT brain, MRI and MRI of Javier brain did not show any acute infarct but multilevel degenerative joint disease of Javier cervical spine.  Chest x-ray was unremarkable.  Due to concern for stroke, patient was initially admitted to hospital and neurology was consulted.   At this time, patient has remained stable.  Awaiting for skilled nursing facility placement but due to being influenza A positive he was on isolation.  Assessment & Plan:  Fall with left-sided weakness,TIA Thought to be secondary to influenza and possible TIA.  MRI/MRA negative for acute findings.  Seen by neurology during hospitalization.  Hemoglobin A1c of 5.6.  Lipid profile with LDL of 103. MRI of Javier C-spine shows degenerative changes.    Patient was seen by physical therapy and recommendation is skilled nursing facility placement for rehab.    Generalized weakness, physical deconditioning/debility.    Continue aspirin, Plavix and statin.  Physical therapy, occupational Therapy has seen Javier patient and recommended skilled nursing facility.  Influenza A:  Completed course of Tamiflu.  Isolation has been discontinued.  Hyperlipidemia, Essential hypertension: continue Crestor.  Patient is on losartan HCTZ as outpatient.  Has been resumed on losartan at this time.  HCTZ has been discontinued  Vascular dementia, Anxiety:   On BuSpar and Zoloft.   History of CLL,Thrombocytopenia: Patient follows up with Dr. Irene [name], oncology as outpatient.  No evidence of bleeding.  Latest platelet count of 167K  BPH on Flomax   Hypokalemia.  Improved after replacement.  Potassium of 3.7 at this time.  Disposition.  At this time, patient is medically stable for disposition to skilled nursing facility.  Isolation has been discontinued.  TOC has been made aware.  DVT prophylaxis: enoxaparin (LOVENOX) injection 40 mg Start: 08/09/21 1230  Code Status: Full code  Family Communication:  Spoke with Javier patient's spouse Ms. Javier [name] on Javier phone on 08/16/2021 and updated her about Javier clinical condition of Javier patient and plan for disposition.  Status is: Inpatient  Patient remains inpatient due to physical deconditioning, debility, influenza, need for skilled nursing facility placement.  Subjective:  Today, patient was seen and examined at bedside.  Patient denies any nausea vomiting fever chills or rigor.  Denies any pain.  Denies overt cough.   Objective:  Vitals:   08/15/21 1936 08/15/21 2331 08/16/21 0300 08/16/21 0830  BP: 129/75 125/80  132/73  Pulse: (!) 59 (!) 59  67  Resp: (!) 21 18 17 18   Temp: 98.1 F (36.7 C) (!) 97.5 F (36.4 C) 97.9 F (36.6 C) 98.3 F (36.8 C)  TempSrc:  Oral Oral   SpO2: 99% 98% 100% 100%   Weight change:   Intake/Output Summary (Last 24 hours) at 08/16/2021 1332 Last data filed at 08/16/2021 0700 Gross per 24 hour  Intake 240 ml  Output 925 ml  Net -685 ml    Net IO Since Admission: -4,945 mL [  08/16/21 1332]   There is no height or weight on file to calculate BMI.   Physical Examination: General:  Average built, not in obvious distress, alert awake and communicative. HENT:   No scleral pallor or icterus noted. Oral mucosa is moist.  Chest:   Diminished breath sounds bilaterally.  CVS: S1 &S2 heard. No murmur.  Regular rate and rhythm. Abdomen: Soft, nontender,  nondistended.  Bowel sounds are heard.   Extremities: No cyanosis, clubbing or edema.  Peripheral pulses are palpable.  Moving all extremities. Psych: Alert, awake and oriented, normal mood CNS:  No cranial nerve deficits.  Power equal in all extremities.   Skin: Warm and dry.  No rashes noted.   Medications reviewed: Scheduled Meds:   aspirin EC  81 mg Oral Daily   busPIRone  5 mg Oral TID   cholecalciferol  5,000 Units Oral Daily   clopidogrel  75 mg Oral Daily   enoxaparin (LOVENOX) injection  40 mg Subcutaneous Daily   guaiFENesin  600 mg Oral BID   lipase/protease/amylase  12,000 Units Oral TID WC   losartan  100 mg Oral Daily   pantoprazole  40 mg Oral Daily   rosuvastatin  20 mg Oral Daily   sertraline  50 mg Oral Daily   tamsulosin  0.4 mg Oral QPC supper    Consultants:  Neurology  Procedures: None  Antimicrobials: Tamiflu-completed  Culture/Microbiology    Component Value Date/Time   SDES  04/20/2021 1830    URINE, CLEAN CATCH Performed at [name], [address], [place], [name] [address]    [facility]  04/20/2021 1830    NONE Performed at [name], [name], [name], [name] [address]    CULT MULTIPLE SPECIES PRESENT, SUGGEST RECOLLECTION (A) 04/20/2021 1830   REPTSTATUS 04/22/2021 FINAL 04/20/2021 1830     Data Reviewed: I have personally reviewed Javier following labs and imaging studies   CBC: Recent Labs  Lab 08/10/21 0323 08/12/21 0342 08/13/21 0220 08/16/21 0209  WBC 6.1 3.9* 3.3* 4.8  NEUTROABS 4.8  --   --   --   HGB 12.6* 11.4* 10.8* 11.4*  HCT 38.9* 33.3* 32.9* 35.0*  MCV 93.3 89.3 90.4 90.0  PLT PLATELET CLUMPS NOTED ON SMEAR, COUNT APPEARS DECREASED 80* 93* 951    Basic Metabolic Panel: Recent Labs  Lab 08/10/21 0323 08/12/21 0342 08/13/21 0220 08/16/21 0209  NA 139 137 139 136  K 3.5 3.2* 4.0 3.7  CL 105 106 108 106  CO2 24 24 25 23   GLUCOSE 75 92 110* 92  BUN 15  10 11 12   CREATININE 1.04 0.83 0.87 0.88  CALCIUM 9.2 8.8* 9.0 9.4  MG  --   --  2.2  --     GFR: CrCl cannot be calculated (Unknown ideal weight.). Liver Function Tests: Recent Labs  Lab 08/10/21 0323  AST 24  ALT 14  ALKPHOS 50  BILITOT 0.9  PROT 6.4*  ALBUMIN 3.6    No results for input(s): LIPASE, AMYLASE in Javier last 168 hours. No results for input(s): AMMONIA in Javier last 168 hours. Coagulation Profile: No results for input(s): INR, PROTIME in Javier last 168 hours.  Cardiac Enzymes: No results for input(s): CKTOTAL, CKMB, CKMBINDEX, TROPONINI in Javier last 168 hours.  BNP (last 3 results) No results for input(s): PROBNP in Javier last 8760 hours. HbA1C: No results for input(s): HGBA1C in Javier last 72 hours.  CBG: No results for input(s): GLUCAP in Javier last 168  hours. Lipid Profile: No results for input(s): CHOL, HDL, LDLCALC, TRIG, CHOLHDL, LDLDIRECT in Javier last 72 hours.  Thyroid Function Tests: No results for input(s): TSH, T4TOTAL, FREET4, T3FREE, THYROIDAB in Javier last 72 hours.  Anemia Panel: No results for input(s): VITAMINB12, FOLATE, FERRITIN, TIBC, IRON, RETICCTPCT in Javier last 72 hours. Sepsis Labs: No results for input(s): PROCALCITON, LATICACIDVEN in Javier last 168 hours.  Recent Results (from Javier past 240 hour(s))  Resp Panel by RT-PCR (Flu A&B, Covid) Nasopharyngeal Swab     Status: Abnormal   Collection Time: 08/09/21  8:28 AM   Specimen: Nasopharyngeal Swab; Nasopharyngeal(NP) swabs in vial transport medium  Result Value Ref Range Status   SARS Coronavirus 2 by RT PCR NEGATIVE NEGATIVE Final    Comment: (NOTE) SARS-CoV-2 target nucleic acids are NOT DETECTED.  Javier SARS-CoV-2 RNA is generally detectable in upper respiratory specimens during Javier acute phase of infection. Javier lowest concentration of SARS-CoV-2 viral copies this assay can detect is 138 copies/mL. A negative result does not preclude SARS-Cov-2 infection and should not be used as Javier sole  basis for treatment or other patient management decisions. A negative result may occur with  improper specimen collection/handling, submission of specimen other than nasopharyngeal swab, presence of viral mutation(s) within Javier areas targeted by this assay, and inadequate number of viral copies(<138 copies/mL). A negative result must be combined with clinical observations, patient history, and epidemiological information. Javier expected result is Negative.  Fact Sheet for Patients:  EntrepreneurPulse.com.au  Fact Sheet for Healthcare Providers:  IncredibleEmployment.be  This test is no t yet approved or cleared by Javier [place] FDA and  has been authorized for detection and/or diagnosis of SARS-CoV-2 by FDA under an Emergency Use Authorization (EUA). This EUA will remain  in effect (meaning this test can be used) for Javier duration of Javier COVID-19 declaration under Section 564(b)(1) of Javier Act, 21 U.S.C.section 360bbb-3(b)(1), unless Javier authorization is terminated  or revoked sooner.       Influenza A by PCR POSITIVE (A) NEGATIVE Final   Influenza B by PCR NEGATIVE NEGATIVE Final    Comment: (NOTE) Javier Xpert Xpress SARS-CoV-2/FLU/RSV plus assay is intended as an aid in Javier diagnosis of influenza from Nasopharyngeal swab specimens and should not be used as a sole basis for treatment. Nasal washings and aspirates are unacceptable for Xpert Xpress SARS-CoV-2/FLU/RSV testing.  Fact Sheet for Patients: EntrepreneurPulse.com.au  Fact Sheet for Healthcare Providers: IncredibleEmployment.be  This test is not yet approved or cleared by Javier [place] FDA and has been authorized for detection and/or diagnosis of SARS-CoV-2 by FDA under an Emergency Use Authorization (EUA). This EUA will remain in effect (meaning this test can be used) for Javier duration of Javier COVID-19 declaration under Section 564(b)(1) of Javier  Act, 21 U.S.C. section 360bbb-3(b)(1), unless Javier authorization is terminated or revoked.  Performed at [facility], [place] [address], [place], [place] [address]      Radiology Studies: No results found.   LOS: 6 days   Flora Lipps, MD [facility] 08/16/2021, 1:32 PM

## 2021-08-16 NOTE — Progress Notes (Addendum)
Physical Therapy Treatment Patient Details Name: Javier [name] MRN: [id number] DOB: [birth year] Today's Date: 08/16/2021   History of Present Illness Pt is an 80 y.o. male who presented 08/09/21 s/p fall with reports of L sided weakness. CT and MRI of head negative for acute intracranial processes. All other imaging also negative 11/11. Pt positive for influenza A. PMHx of "pre" DM II, HLD, HTN, CLL, vascular dementia, remote lacunar stroke, BPH, gastric AVM with weight loss, remote tobacco abuse, anxiety, and Vit D deficiency.    PT Comments    Pt received in supine and pleasantly agreeable to therapy session. Pts wife, [name], present and encouraging. Pt is making progress towards PT goals as demonstrated by increased distance with household gait tasks and less physical assist needed with better carryover of cues this session (up to minA with RW support). Emphasis on upright posture, lifting head for environmental scanning and RW management. Continue to recommend SNF level therapy upon DC to address remaining deficits in strength, mobility, and function. Pt continues to benefit from PT services to progress toward functional mobility goals.      Recommendations for follow up therapy are one component of a multi-disciplinary discharge planning process, led by the attending physician.  Recommendations may be updated based on patient status, additional functional criteria and insurance authorization.  Follow Up Recommendations  Skilled nursing-short term rehab (<3 hours/day)     Assistance Recommended at Discharge Frequent or constant Supervision/Assistance  Equipment Recommendations  BSC/3in1;Other (comment);Hospital bed (tub bench)    Recommendations for Other Services       Precautions / Restrictions Precautions Precautions: Fall Restrictions Weight Bearing Restrictions: No     Mobility  Bed Mobility Overal bed mobility: Needs Assistance Bed Mobility: Supine to Sit    Supine  to sit: HOB elevated;Min guard    General bed mobility comments: no physical assist given, use of rail   Transfers Overall transfer level: Needs assistance Equipment used: Rolling walker (2 wheels) Transfers: Sit to/from Stand;Bed to chair/wheelchair/BSC Sit to Stand: Min assist   General transfer comment: up to minA for power up, hand palcement, upright posture and steadying once standing. Pt able to self-correct for proper hand placement after initial cues.    Ambulation/Gait Ambulation/Gait assistance: Min assist;+2 safety/equipment (chair follow) Gait Distance (Feet): 75 Feet Assistive device: Rolling walker (2 wheels) Gait Pattern/deviations: Decreased stride length;Trunk flexed;Leaning posteriorly;Step-to pattern Gait velocity: decreased    General Gait Details: Able to advance gait training to hallway today with droplet precautions lifted, pt able to ambulate with decreased posterior lean and less cues required for upright posture and walker management   Modified Rankin (Stroke Patients Only) Modified Rankin (Stroke Patients Only) Pre-Morbid Rankin Score: Moderate disability Modified Rankin: Moderately severe disability     Balance Overall balance assessment: Needs assistance;History of Falls Sitting-balance support: No upper extremity supported;Feet supported Sitting balance-Leahy Scale: Good   Postural control: Posterior lean Standing balance support: Reliant on assistive device for balance;Bilateral upper extremity supported;During functional activity Standing balance-Leahy Scale: Poor     Cognition Arousal/Alertness: Awake/alert Behavior During Therapy: WFL for tasks assessed/performed Overall Cognitive Status: History of cognitive impairments - at baseline    General Comments: Pt slightly impulsive, requires cues to not stand until therapist provides cue      Exercises Other Exercises Other Exercises: Seated: LAQ x10, ankle pumps x10, marching x10, heel  raises x10    General Comments General comments (skin integrity, edema, etc.): VSS on RA      Pertinent  Vitals/Pain Pain Assessment: No/denies pain Pain Intervention(s): Monitored during session     PT Goals (current goals can now be found in the care plan section) Acute Rehab PT Goals Patient Stated Goal: to get stronger before going home PT Goal Formulation: With patient Time For Goal Achievement: 08/23/21 Progress towards PT goals: Progressing toward goals    Frequency    Min 3X/week      PT Plan Discharge plan needs to be updated       AM-PAC PT "6 Clicks" Mobility   Outcome Measure  Help needed turning from your back to your side while in a flat bed without using bedrails?: A Little Help needed moving from lying on your back to sitting on the side of a flat bed without using bedrails?: A Little Help needed moving to and from a bed to a chair (including a wheelchair)?: A Little Help needed standing up from a chair using your arms (e.g., wheelchair or bedside chair)?: A Lot (mod+ cues for all below) Help needed to walk in hospital room?: A Lot Help needed climbing 3-5 steps with a railing? : Total 6 Click Score: 14    End of Session Equipment Utilized During Treatment: Gait belt Activity Tolerance: Patient tolerated treatment well Patient left: with family/visitor present;in chair;with call bell/phone within reach;with chair alarm set (wife, [name], present) Nurse Communication: Mobility status PT Visit Diagnosis: Unsteadiness on feet (R26.81);Other abnormalities of gait and mobility (R26.89);Muscle weakness (generalized) (M62.81);History of falling (Z91.81);Difficulty in walking, not elsewhere classified (R26.2)     Time: 1205-1220 PT Time Calculation (min) (ACUTE ONLY): 15 min  Charges:  $Gait Training: 8-22 mins                     Evelene Croon, Student PTA CI: Carly P., PTA  Carly M Poff 08/16/2021, 1:12 PM

## 2021-08-16 NOTE — TOC Progression Note (Signed)
Transition of Care [facility]) - Progression Note    Patient Details  Name: Javier [name] MRN: [id number] Date of Birth: 01/17/1941  [facility] of Care [facility] CM/SW [place], [place] Phone Number: 08/16/2021, 3:21 PM  Clinical Narrative:   CSW contacted [facility], as patient now off droplet precautions. [facility] doesn't have any beds available now, unsure when they'll have one open. Patient has no other bed offers. CSW met with wife at bedside to discuss, she is open to looking at other facilities. CSW contacted pending referrals, and [facility] made a bed offer. CSW provided information to wife and she is agreeable to [facility]. [facility] will have a bed available for patient on Monday. CSW to initiate insurance authorization, plan to admit to SNF on Monday.    Expected Discharge Plan: [place] Barriers to Discharge: Continued Medical Work up, Ship broker, SNF Pending bed offer  Expected Discharge Plan and Services Expected Discharge Plan: [place] Choice: [place] arrangements for the past 2 months: Single Family Home                                       Social Determinants of Health (SDOH) Interventions    Readmission Risk Interventions No flowsheet data found.

## 2021-08-16 NOTE — Plan of Care (Signed)
  Problem: Education: Goal: Ability to demonstrate appropriate child care will improve Outcome: Progressing Goal: Ability to verbalize an understanding of newborn treatment and procedures will improve Outcome: Progressing Goal: Ability to demonstrate an understanding of appropriate nutrition and feeding will improve Outcome: Progressing Goal: Individualized Educational Video(s) Outcome: Progressing   Problem: Nutritional: Goal: Nutritional status of the infant will improve as evidenced by minimal weight loss and appropriate weight gain for gestational age Outcome: Progressing Goal: Ability to maintain a balanced intake and output will improve Outcome: Progressing   Problem: Clinical Measurements: Goal: Ability to maintain clinical measurements within normal limits will improve Outcome: Progressing   Problem: Skin Integrity: Goal: Risk for impaired skin integrity will decrease Outcome: Progressing Goal: Demonstrates signs of wound healing without infection Outcome: Progressing

## 2021-08-17 DIAGNOSIS — J101 Influenza due to other identified influenza virus with other respiratory manifestations: Secondary | ICD-10-CM | POA: Diagnosis not present

## 2021-08-17 DIAGNOSIS — G459 Transient cerebral ischemic attack, unspecified: Secondary | ICD-10-CM

## 2021-08-17 NOTE — Assessment & Plan Note (Signed)
Continue losartan. 

## 2021-08-17 NOTE — Assessment & Plan Note (Signed)
Comleted Tamiflu

## 2021-08-17 NOTE — Assessment & Plan Note (Signed)
-  Continue Buspar and Zoloft ?

## 2021-08-17 NOTE — Assessment & Plan Note (Signed)
Possible, not likely but not ruled out.  TIA work up unremarkable, Neurology recommended aspirin and Plavix DAPT for 3 weeks and then resume aspirin 81.  Increase Crestor 10 to 20 for HLD.

## 2021-08-17 NOTE — Assessment & Plan Note (Signed)
THis is generalized, diffuse, and present for months.  Does not have a diurnal variation to suggest MG.  Has had an extensive work-up with his outpatient doctor which is notable for normal TSH, B12, CK, PSA, CA 19-9, normal blood counts, normal liver function panel, and normal renal function and electrolytes.  He has had an unremarkable EGD/colonoscopy, normal chest x-ray, and CT abdomen pelvis which ruled out mass of the pancreas, liver, kidneys.  He has a very weakly positive ANA which is nonspecific and very likely to be related.  He has a moderately reduced pancreatic elastase, unclear significance, other testing ruled out celiac disease. - Given the extent of work up above, I suspect he has some degree of failure to thrive, cause not completely clear

## 2021-08-17 NOTE — Assessment & Plan Note (Signed)
In remission.

## 2021-08-17 NOTE — Progress Notes (Signed)
Progress Note    Javier [name]   [id number]:[id number]  DOB: 05-Feb-1941  DOA: 08/09/2021     7 Date of Service: 08/17/2021     Brief summary: Javier [name], 80 y.o. male with past medical history of hypertension, hyperlipidemia, CLL, BPH and prediabetes presented to the hospital with generalized weakness for the past 8 months with falls.  There was also mention of left-sided weakness, cough and sputum production for last 2 days.  In the ED patient had hemoglobin of 12.5.  Platelets of 112.  Total bilirubin of 1.5.  Patient tested positive for influenza A.  X-ray of the left shoulder without acute findings. 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  Chest x-ray was unremarkable.  Due to concern for stroke, patient was initially admitted to hospital and neurology was consulted.    At this time, patient has remained stable.  Awaiting for skilled nursing facility placement but due to being influenza A positive he was on isolation.        Assessment and Plan * Influenza A Comleted Tamiflu  TIA (transient ischemic attack) Possible, not likely but not ruled out.  TIA work up unremarkable, Neurology recommended aspirin and Plavix DAPT for 3 weeks and then resume aspirin 81.  Increase Crestor 10 to 20 for HLD.  Vascular dementia ([place]) -Continue Buspar and Zoloft  Personal history of CLL (chronic lymphocytic leukemia) In remission  Weakness THis is generalized, diffuse, and present for months.  Does not have a diurnal variation to suggest MG.  Has had an extensive work-up with his outpatient doctor which is notable for normal TSH, B12, CK, PSA, CA 19-9, normal blood counts, normal liver function panel, and normal renal function and electrolytes.  He has had an unremarkable EGD/colonoscopy, normal chest x-ray, and CT abdomen pelvis which ruled out mass of the pancreas, liver, kidneys.  He has a very  weakly positive ANA which is nonspecific and very likely to be related.  He has a moderately reduced pancreatic elastase, unclear significance, other testing ruled out celiac disease. - Given the extent of work up above, I suspect he has some degree of failure to thrive, cause not completely clear  Hypertension -Continue losartan     Subjective:  Mild URI symptoms, no new fever, no cough, no dyspnea, no sputum, no confusion.  He still feels generally weak, but is gradually improved.  No new focal weakness or numbness.  Objective Vitals:   08/17/21 0338 08/17/21 0825 08/17/21 1236 08/17/21 1300  BP: 120/67 121/64 123/77 110/61  Pulse: 66 65 (!) 102 74  Resp: 19 16 16 15   Temp: (!) 97.3 F (36.3 C) 98.2 F (36.8 C) 98.4 F (36.9 C) (!) 97.5 F (36.4 C)  TempSrc: Oral Oral Oral Oral  SpO2: 98% 98% 100% 97%      Vital signs were reviewed and unremarkable.   Exam General appearance: Adult elderly man, appears debilitated, lying in bed, no acute distress     HEENT: Anicteric, conjunctival pink, lids and lashes normal, no oral lesions, no nasal family, discharge, or epistaxis.  Oropharynx moist, lips normal, dentition in good repair Skin: No suspicious rashes or lesions. Cardiac: RRR, no murmurs, no lower extremity edema Respiratory: Normal respiratory rate and rhythm, lungs clear without rales or wheezes Abdomen: Abdomen soft and nontender salvation or guarding, no ascites or distention MSK: Moderately reduced subcutaneous muscle mass and fat diffusely Neuro:  Generalized weakness in all 4 extremities, speech fluent, face symmetric Psych: Attention slightly depressed, affect blunted, judgment insight appear mildly impaired, oriented to person, place, and time      Labs / Other Information My review of labs, imaging, notes and other tests is significant for Normal electrolytes, renal function, and blood counts here.  Normal TSH, B12, CK, PSA, CA 19-9 as an outpatient in the  last 6 months.  Weakly positive ANA, reduced pancreatic elastase, normal IgA, normal EGD/colon, normal chest x-ray, normal CT abdomen and pelvis     Disposition Plan: Status is: Inpatient  Remains inpatient appropriate because: He requires rehab to return to his prior level of function, placement is pending on Monday.  Spoke to the wife and daughter at the bedside        Time spent: 25 minutes [facility] 08/17/2021, 4:09 PM

## 2021-08-17 NOTE — Hospital Course (Addendum)
Mr. Weigelt is an 80 y.o. M with HTN, CLL in remission, and recent failure to thrive, progressive generalized weakness, and weight loss who presented with worsening weakness.  Over last year, since completing CLL treatment, patient has lost 10 kg (83 kg last year, down to 74 kg now, 12% bodyweight) and developed progressive generalized weakness and anergy.  During that time, he has seen Neurology (who diagnosed mild cognitive impairment), Gastroenterology (who performed upper and lower endoscopy and tested for pancreatic insufficiency and Celiac) and had testing for renal failure, progressive anemia, liver disease, but also myositis, hypothyroidism, anti-nuclear antibodies, and vitamin B12, all of which were normal.  In this context, in the days before admission, he got markedly more weak, with a new cough and sputum, so family brought him to the ER where he was found to be influenza A positive

## 2021-08-18 DIAGNOSIS — J101 Influenza due to other identified influenza virus with other respiratory manifestations: Secondary | ICD-10-CM | POA: Diagnosis not present

## 2021-08-18 NOTE — Assessment & Plan Note (Signed)
Possible, not likely but not ruled out.  TIA work up unremarkable, Neurology recommend:  - aspirin and Plavix DAPT for 3 weeks and then resume aspirin 81.   - Increase Crestor 10 to 20 for HLD.

## 2021-08-18 NOTE — Assessment & Plan Note (Signed)
-   Check anti-AChr antibodies - Follow up fecal elastase with GI - Follow up with PCP

## 2021-08-18 NOTE — Assessment & Plan Note (Signed)
Comleted Tamiflu.  Symptoms resolved.

## 2021-08-18 NOTE — Assessment & Plan Note (Signed)
-  Continue Buspar and Zoloft ?

## 2021-08-18 NOTE — Progress Notes (Signed)
  Progress Note    Javier [name]   [id number]:[id number]  DOB: April 04, 1941  DOA: 08/09/2021     8 Date of Service: 08/18/2021     Brief summary: Mr. Errico is an 80 y.o. M with HTN, CLL in remission, and recent failure to thrive, progressive generalized weakness, and weight loss who presented with worsening weakness.  Over last year, since completing CLL treatment, patient has lost 10 kg (83 kg last year, down to 74 kg now, 12% bodyweight) and developed progressive generalized weakness and anergy.  During that time, he has seen Neurology (who diagnosed mild cognitive impairment), Gastroenterology (who performed upper and lower endoscopy and tested for pancreatic insufficiency and Celiac) and had testing for renal failure, progressive anemia, liver disease, but also myositis, hypothyroidism, anti-nuclear antibodies, and vitamin B12, all of which were normal.  In this context, in the days before admission, he got markedly more weak, with a new cough and sputum, so family brought him to the ER where he was found to be influenza A positive           Assessment and Plan * Influenza A Comleted Tamiflu.  Symptoms resolved.  TIA (transient ischemic attack) Possible, not likely but not ruled out.  TIA work up unremarkable, Neurology recommend:  - aspirin and Plavix DAPT for 3 weeks and then resume aspirin 81.   - Increase Crestor 10 to 20 for HLD.  Vascular dementia ([place]) - Continue Buspar and Zoloft  Weakness - Check anti-AChr antibodies - Follow up fecal elastase with GI - Follow up with PCP  Hypertension - Continue losartan     Subjective:  No new fever, cough, confusion, weakness.  He is gradually improving. Objective Vitals:   08/18/21 0000 08/18/21 0421 08/18/21 0800 08/18/21 1200  BP: 105/75 139/73 122/74 (!) 135/59  Pulse: 69 69 67 70  Resp: 14 17 16 16   Temp: 97.6 F (36.4 C) 98.2 F (36.8 C) 98.2 F (36.8 C) 98.1 F (36.7 C)  TempSrc:  Oral Oral Oral  SpO2:  98% 97% 99% 100%      Vital signs were reviewed and unremarkable.   Exam General appearance: Elderly adult male, lying in bed, no acute distress     HEENT:    Skin:  Cardiac: RRR, no murmurs, no lower extremity edema Respiratory: Respiratory rate and rhythm, lungs clear without rales or wheezes Abdomen: Abdomen soft no tenderness palpation or guarding, no ascites or distention MSK:  Neuro: Awake and alert, has generalized weakness, face symmetric, speech fluent Psych: Attention diminished, affect blunted, psychomotor slowing     Labs / Other Information My review of the notes, labs, and prior imaging is reviewed above   Disposition Plan: Status is: Inpatient  Remains inpatient appropriate because: Still tomorrow when he will be able to transition to acute rehab.  Spoke with wife at the bedside        Time spent: 25 minutes [facility] 08/18/2021, 3:49 PM

## 2021-08-18 NOTE — Assessment & Plan Note (Signed)
Continue losartan. 

## 2021-08-19 ENCOUNTER — Telehealth: Payer: Medicare HMO | Admitting: Pharmacist

## 2021-08-19 DIAGNOSIS — J101 Influenza due to other identified influenza virus with other respiratory manifestations: Secondary | ICD-10-CM | POA: Diagnosis not present

## 2021-08-19 NOTE — TOC Progression Note (Signed)
Transition of Care [facility]) - Progression Note    Patient Details  Name: Javier [name] MRN: [id number] Date of Birth: [birth year]  Transition [facility] Care [facility] CM/SW [place], [place] Phone Number: 08/19/2021, 10:57 AM  Clinical Narrative:   CSW notified that patient's [employer] is not managed by [facility], so [facility] will need to initiate insurance authorization request. CSW updated [place], and will need updated PT note. CSW spoke with PT who saw patient this morning and working on note. CSW to send updated note to [place] for insurance authorization. CSW to follow.    Expected Discharge Plan: [place] Barriers to Discharge: Continued Medical Work up, Ship broker, SNF Pending bed offer  Expected Discharge Plan and Services Expected Discharge Plan: [place] Choice: [place] arrangements for the past 2 months: Single Family Home                                       Social Determinants of Health (SDOH) Interventions    Readmission Risk Interventions No flowsheet data found.

## 2021-08-19 NOTE — Progress Notes (Signed)
  Progress Note    Javier [name]   CVE:[id number]  DOB: Sep 11, 1941  DOA: 08/09/2021     9 Date of Service: 08/19/2021   Clinical Course Brief summary: Mr. Neidig is an 80 y.o. M with HTN, CLL in remission, and recent failure to thrive, progressive generalized weakness, and weight loss who presented with worsening weakness.  Over last year, since completing CLL treatment, patient has lost 10 kg (83 kg last year, down to 74 kg now, 12% bodyweight) and developed progressive generalized weakness and anergy.  During that time, he has seen Neurology (who diagnosed mild cognitive impairment), Gastroenterology (who performed upper and lower endoscopy and tested for pancreatic insufficiency and Celiac) and had testing for renal failure, progressive anemia, liver disease, but also myositis, hypothyroidism, anti-nuclear antibodies, and vitamin B12, all of which were normal.  In this context, in the days before admission, he got markedly more weak, with a new cough and sputum, so family brought him to the ER where he was found to be influenza A positive           Assessment and Plan TIA (transient ischemic attack) See note from 11/20 -Continue aspirin and Plavix -Continue Crestor  Vascular dementia ([place]) - Continue Buspar and Zoloft  Hypertension - Continue losartan     Subjective:  No worsening fever, no cough, no confusion.  More weakness today generalized.     Objective Vitals:   08/19/21 0420 08/19/21 0743 08/19/21 1125 08/19/21 1508  BP: 134/72 134/80 (!) 144/69 133/66  Pulse: 64 64 62 66  Resp: 16 14 14 14   Temp: 97.6 F (36.4 C) 97.7 F (36.5 C) (!) 97.5 F (36.4 C) (!) 97.5 F (36.4 C)  TempSrc: Oral Oral Oral Oral  SpO2: 99% 99% 99% 100%      Vital signs were reviewed and unremarkable.   Exam General appearance: Elderly adult male, lying in bed, no acute distress     HEENT:    Skin:  Cardiac: RRR, no murmurs, no lower extremity edema Respiratory:  Respiratory rate and rhythm are normal, lungs clear without rales or wheezes Abdomen: Abdomen soft without tenderness to palpation or guarding, no ascites or distention MSK: Moderately reduced subcutaneous muscle mass and fat Neuro: Awake and alert, extraocular movements intact, generalized weakness, 4+/5 in all extremities, face symmetric, speech fluent Psych: Attention normal, affect blunted, judgment and insight appear mildly impaired, psychomotor slowing is noted    Labs / Other Information There are no new results to review at this time.   Disposition Plan: Status is: Inpatient  Remains inpatient appropriate because: The patient is medically ready for discharge, he had to stay until today due to influenza isolation, today there is a clerical error on his insurance coverage and we still await insurance authorization  Spoke with wife at the bedside        Time spent: 25 minutes [facility] 08/19/2021, 5:04 PM

## 2021-08-19 NOTE — Consult Note (Signed)
   [facility] Inpatient Consult   08/19/2021  AYOMIDE PURDY 1940-11-09 [id number]  [place] Organization [ACO] Patient: [employer] Medicare  Primary Care Provider:  Unk Pinto, MD, [facility]   Patient screened for length of stay of 10 days hospitalization with noted   Review of patient's medical record reveals patient is to transition to a skilled nursing facility level of care   Plan: Needs are to be met at a skilled nursing level for transition. No THN CM needs at this time.  For questions contact:   Natividad Brood, RN BSN [facility] Liaison  [phone] business mobile phone Toll free office [phone]  Fax number: [phone] [email or web address]

## 2021-08-19 NOTE — Progress Notes (Addendum)
Physical Therapy Treatment Patient Details Name: Javier [name] MRN: [id number] DOB: 07-03-1941 Today's Date: 08/19/2021   History of Present Illness Pt is an 80 y.o. male who presented 08/09/21 s/p fall with reports of L sided weakness. CT and MRI of head negative for acute intracranial processes. All other imaging also negative 11/11. Pt positive for influenza A. PMHx of "pre" DM II, HLD, HTN, CLL, vascular dementia, remote lacunar stroke, BPH, gastric AVM with weight loss, remote tobacco abuse, anxiety, and Vit D deficiency.    PT Comments    Pt received in supine and pleasantly agreeable to therapy session. Pts wife, Javier [name], was present and supportive throughout session. Pt was limited by fatigue and demonstrated decreased distance with household ambulation tasks and requested to sit after a short distance today due to fatigue. Discussed disposition with wife who agreed that he will benefit from additional therapy in a skilled setting as he continues to need up to +2 minA for transfers and short gait trials with RW. Pt unsafe with rollator (needed modA with rollator for stepping at bedside) and recommend he continue to use two-wheeled rolling walker post-acute, DME updated below per discussion with pt/spouse and supervising PT. Continue to recommend SNF level therapies to address remaining deficits in strength, mobility, and function, upon DC.   Recommendations for follow up therapy are one component of a multi-disciplinary discharge planning process, led by the attending physician.  Recommendations may be updated based on patient status, additional functional criteria and insurance authorization.  Follow Up Recommendations  Skilled nursing-short term rehab (<3 hours/day)     Assistance Recommended at Discharge Frequent or constant Supervision/Assistance  Equipment Recommendations  BSC/3in1;Other (comment);Hospital bed;Rolling walker (2 wheels) (tub bench)    Recommendations for Other  Services       Precautions / Restrictions Precautions Precautions: Fall Restrictions Weight Bearing Restrictions: No     Mobility  Bed Mobility Overal bed mobility: Needs Assistance Bed Mobility: Supine to Sit    Supine to sit: HOB elevated;Min guard    General bed mobility comments: no physical assist given, use of rail    Transfers Overall transfer level: Needs assistance Equipment used: Rolling walker (2 wheels);Rollator (4 wheels) Transfers: Sit to/from Stand;Bed to chair/wheelchair/BSC Sit to Stand: Min guard;Min assist    General transfer comment: when standing to rollator, minA +2, standing to sit from rollator modA with heavy cues to reach back to bed with no follow through; when standing to RW minG with reminders to push from bed/chair, up to minA for standing to sit from RW due to needing heavy cues to reach back before sitting    Ambulation/Gait Ambulation/Gait assistance: Min assist;+2 safety/equipment (chair follow) Gait Distance (Feet): 10 Feet (10 ft, sitting break, 15 ft) Assistive device: Rolling walker (2 wheels) Gait Pattern/deviations: Decreased stride length;Trunk flexed;Leaning posteriorly;Step-to pattern Gait velocity: decreased     General Gait Details: Pt requiring increased multimodal cues today to maintain upright posture, pt gait distance limited by fatigue and needing to sit   Modified Rankin (Stroke Patients Only) Modified Rankin (Stroke Patients Only) Pre-Morbid Rankin Score: Moderate disability Modified Rankin: Moderately severe disability     Balance Overall balance assessment: Needs assistance;History of Falls Sitting-balance support: No upper extremity supported;Feet supported Sitting balance-Leahy Scale: Good Sitting balance - Comments: seated EOB Postural control: Posterior lean Standing balance support: Reliant on assistive device for balance;Bilateral upper extremity supported;During functional activity Standing balance-Leahy  Scale: Poor Standing balance comment: up to minA for static standing balance and modA  for dynamic standing balance      Cognition Arousal/Alertness: Awake/alert Behavior During Therapy: WFL for tasks assessed/performed Overall Cognitive Status: History of cognitive impairments - at baseline   General Comments: Pt slightly impulsive, requires cues to not stand/sit until therapist provides cue       Exercises Other Exercises Other Exercises: Seated: LAQ x10        Pertinent Vitals/Pain Pain Assessment: No/denies pain     PT Goals (current goals can now be found in the care plan section) Acute Rehab PT Goals Patient Stated Goal: to get stronger before going home PT Goal Formulation: With patient Time For Goal Achievement: 08/23/21 Progress towards PT goals: Progressing toward goals    Frequency    Min 3X/week      PT Plan Current plan remains appropriate       AM-PAC PT "6 Clicks" Mobility   Outcome Measure  Help needed turning from your back to your side while in a flat bed without using bedrails?: A Little Help needed moving from lying on your back to sitting on the side of a flat bed without using bedrails?: A Little Help needed moving to and from a bed to a chair (including a wheelchair)?: A Little Help needed standing up from a chair using your arms (e.g., wheelchair or bedside chair)?: A Lot (mod+ cues for all below) Help needed to walk in hospital room?: A Lot Help needed climbing 3-5 steps with a railing? : Total 6 Click Score: 14    End of Session Equipment Utilized During Treatment: Gait belt Activity Tolerance: Patient tolerated treatment well;Patient limited by fatigue Patient left: with family/visitor present;in chair;with call bell/phone within reach;with chair alarm set (wife, Javier [name], present) Nurse Communication: Mobility status PT Visit Diagnosis: Unsteadiness on feet (R26.81);Other abnormalities of gait and mobility (R26.89);Muscle weakness  (generalized) (M62.81);History of falling (Z91.81);Difficulty in walking, not elsewhere classified (R26.2)     Time: 1023-1040 PT Time Calculation (min) (ACUTE ONLY): 17 min  Charges:  $Gait Training: 8-22 mins                     Evelene Croon, Student PTA CI: Carly P., PTA  Carly M Poff 08/19/2021, 11:31 AM

## 2021-08-19 NOTE — Plan of Care (Signed)

## 2021-08-19 NOTE — Assessment & Plan Note (Signed)
Continue losartan. 

## 2021-08-19 NOTE — Assessment & Plan Note (Signed)
See note from 11/20 -Continue aspirin and Plavix -Continue Crestor

## 2021-08-19 NOTE — Assessment & Plan Note (Signed)
-  Continue Buspar and Zoloft ?

## 2021-08-20 DIAGNOSIS — I10 Essential (primary) hypertension: Secondary | ICD-10-CM | POA: Diagnosis not present

## 2021-08-20 DIAGNOSIS — I639 Cerebral infarction, unspecified: Secondary | ICD-10-CM | POA: Diagnosis not present

## 2021-08-20 DIAGNOSIS — G459 Transient cerebral ischemic attack, unspecified: Secondary | ICD-10-CM | POA: Diagnosis not present

## 2021-08-20 DIAGNOSIS — F0150 Vascular dementia without behavioral disturbance: Secondary | ICD-10-CM | POA: Diagnosis not present

## 2021-08-20 DIAGNOSIS — F39 Unspecified mood [affective] disorder: Secondary | ICD-10-CM | POA: Diagnosis not present

## 2021-08-20 DIAGNOSIS — K8689 Other specified diseases of pancreas: Secondary | ICD-10-CM

## 2021-08-20 DIAGNOSIS — C9111 Chronic lymphocytic leukemia of B-cell type in remission: Secondary | ICD-10-CM | POA: Diagnosis not present

## 2021-08-20 DIAGNOSIS — D696 Thrombocytopenia, unspecified: Secondary | ICD-10-CM | POA: Diagnosis not present

## 2021-08-20 DIAGNOSIS — J101 Influenza due to other identified influenza virus with other respiratory manifestations: Secondary | ICD-10-CM | POA: Diagnosis not present

## 2021-08-20 DIAGNOSIS — R1312 Dysphagia, oropharyngeal phase: Secondary | ICD-10-CM | POA: Diagnosis not present

## 2021-08-20 DIAGNOSIS — Z8673 Personal history of transient ischemic attack (TIA), and cerebral infarction without residual deficits: Secondary | ICD-10-CM | POA: Diagnosis not present

## 2021-08-20 DIAGNOSIS — R2689 Other abnormalities of gait and mobility: Secondary | ICD-10-CM | POA: Diagnosis not present

## 2021-08-20 DIAGNOSIS — R5381 Other malaise: Secondary | ICD-10-CM | POA: Diagnosis not present

## 2021-08-20 DIAGNOSIS — K219 Gastro-esophageal reflux disease without esophagitis: Secondary | ICD-10-CM | POA: Diagnosis not present

## 2021-08-20 DIAGNOSIS — R41841 Cognitive communication deficit: Secondary | ICD-10-CM | POA: Diagnosis not present

## 2021-08-20 DIAGNOSIS — G4700 Insomnia, unspecified: Secondary | ICD-10-CM | POA: Diagnosis not present

## 2021-08-20 DIAGNOSIS — M6281 Muscle weakness (generalized): Secondary | ICD-10-CM | POA: Diagnosis not present

## 2021-08-20 DIAGNOSIS — R262 Difficulty in walking, not elsewhere classified: Secondary | ICD-10-CM | POA: Diagnosis not present

## 2021-08-20 LAB — RESP PANEL BY RT-PCR (FLU A&B, COVID) ARPGX2
Influenza A by PCR: NEGATIVE
Influenza B by PCR: NEGATIVE
SARS Coronavirus 2 by RT PCR: NEGATIVE

## 2021-08-20 MED ORDER — BUSPIRONE HCL 5 MG PO TABS: 5.0000 mg | ORAL_TABLET | Freq: Three times a day (TID) | ORAL | 2 refills | Status: DC

## 2021-08-20 MED ORDER — CLOPIDOGREL BISULFATE 75 MG PO TABS: 75.0000 mg | ORAL_TABLET | Freq: Every day | ORAL | 0 refills | Status: DC

## 2021-08-20 MED ORDER — ALPRAZOLAM 0.5 MG PO TABS: ORAL_TABLET | ORAL | 0 refills | Status: DC

## 2021-08-20 MED ORDER — LOSARTAN POTASSIUM 100 MG PO TABS: 100.0000 mg | ORAL_TABLET | Freq: Every day | ORAL | Status: DC

## 2021-08-20 NOTE — Assessment & Plan Note (Signed)
-  Continue Buspar and Zoloft ?

## 2021-08-20 NOTE — Discharge Summary (Signed)
Physician Discharge Summary   Patient name: Javier [name]  Admit date:     08/09/2021  Discharge date: 08/20/2021  Discharge Physician: Edwin Dada   PCP: Unk Pinto, MD   Recommendations at discharge:  Follow up with Neurology in 6 weeks Follow up with PCP Dr> Melford Aase within 1 week of discharge from SNF Dr. Melford Aase: Please see below regarding weakness Dr. Melford Aase: Please restart HCTZ if needed        Hospital Course   Javier [name] is an 80 y.o. M with HTN, CLL in remission, and recent failure to thrive, progressive generalized weakness, and weight loss who presented with worsening weakness.  Over last year, since completing CLL treatment, patient has lost 10 kg (83 kg last year, down to 74 kg now, 12% bodyweight) and developed progressive generalized weakness and anergy.  During that time, he has seen Neurology (who diagnosed mild cognitive impairment), Gastroenterology (who performed upper and lower endoscopy and tested for pancreatic insufficiency and Celiac) and had testing for renal failure, progressive anemia, liver disease, but also myositis, hypothyroidism, anti-nuclear antibodies, and vitamin B12, all of which were normal.  In this context, in the days before admission, he got markedly more weak, with a new cough and sputum, so family brought him to the ER where he was found to be influenza A positive     Principal discharge diagnosis: * Influenza A Comleted Tamiflu.  Symptoms resolved.    Other Discharge diagnoses: TIA (transient ischemic attack) Admitted and evaluated by Neurology. TIA was possible, not likely but not ruled out.  Work up for secondary causes of TIA was unremarkable.  Neurology recommend:  - aspirin and Plavix DAPT for 3 weeks and then resume aspirin 81 indefinitely - Increase Crestor 10 to 20   Vascular dementia ([place])  Personal history of CLL (chronic lymphocytic leukemia) In remission  Weakness This is a generalized,  diffuse weakness, and present for months. He has had an extensive work-up with his primary care doctor which is notable for normal TSH, B12, CK, PSA, CA 19-9, normal blood counts, normal liver function panel, and normal renal function and electrolytes.  He has had an unremarkable EGD/colonoscopy, normal chest x-ray, and CT abdomen pelvis which ruled out mass of the pancreas, liver, kidneys.  He has a very weakly positive ANA which is nonspecific and very unlikely to be related/causative.  He has a moderately reduced pancreatic elastase, and has been started on enzymes for pancreatic insufficiency, to the extent this is related.  He takes Xanax, but in very limited amounts, and I doubt this is related.  Other testing ruled out celiac disease.   Does not have a diurnal variation to suggest MG, but I have send anti-AChR Abs.    - If Anti AChR Abs are negative, I see no clear cause for his symptoms despite a very thorough work up.  I would agree with Dr. Melford Aase to push nutrition and focus on physical therapy and mood stabilization.       Pancreatic insufficiency    Hypertension HCTZ stopped      Procedures performed:   Echo MRI brain MRA head and neck  Condition at discharge: stable  Exam General appearance: Elderly adult male, sitting up in recliner, no acute distress, appears tired     Cardiac: RRR, no murmurs, no lower extremity edema Respiratory: Normal respiratory rate and rhythm, lungs clear without rales or wheezes Abdomen: Abdomen soft without tenderness palpation or guarding, no ascites or distention Neuro: Awake  and alert, moves upper extremities with generalized weakness but symmetric strength, speech fluent Psych: Attention normal, affect blunted, judgment insight appear mildly impaired, psychomotor slowing is obvious    Disposition: Skilled nursing facility  Discharge time: greater than 30 minutes.  Contact information for follow-up providers     Marcial Pacas, MD. Go on  10/29/2021.   Specialty: Neurology Why: As scheduled Contact information: [place] [address] [phone]              Contact information for after-discharge care     Destination     [facility] .   Service: Skilled Nursing Contact information: [place] [phone]                    Discharge Instructions     Discharge instructions   Complete by: As directed    From Dr. Loleta Books: You were admitted for the flu. Because we cannot completely rule out that you also had a "mini-stroke" (or TIA), we recommend you take Plavix for the next 2 weeks, then stop on 09/04/21  Go see Dr. Krista Blue in the next 6 weeks (this is your neurologist at [facility]) Go see your primary care doctor within 1 week of discharge from rehab I have sent Dr. Melford Aase the discharge summary, so he can review the overall weakness and weightloss you have had  We have reduced your blood pressure medicines to just losartan (stopped the HCTZ part) Dr. Melford Aase can review your blood pressure and restart if needed  Also, do not take omeprazole with Plavix.  After you stop Plavix on 12/7, you may restart omeprazole if needed In the meantime, you can take famotidine/Pepcid for heart burn  I recommend you stop Xanax soon.  This medicine can cause forgetfulness, dizziness, and fatigue in the elderly, which can be misinterpreted as weakness sometimes.  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 Purified Protein Derivative]    Positive test in 2004        Medication List     STOP taking these medications    losartan-hydrochlorothiazide 100-25 MG tablet Commonly  known as: HYZAAR   omeprazole 40 MG capsule Commonly known as: PRILOSEC   pravastatin 40 MG tablet Commonly known as: PRAVACHOL   triamcinolone 55 MCG/ACT Aero nasal inhaler Commonly known as: NASACORT       TAKE these medications    ALPRAZolam 0.5 MG tablet Commonly known as: XANAX Take 1/2 - 1 tablet 2 x /day ONLY if needed for Anxiety Attack &  limit to 5 days /week to avoid Addiction & Dementia / - Recommend use sparingly - NOT ROUTINE !  - Rx to last 1 month   aspirin EC 81 MG tablet Take 81 mg by mouth daily.   busPIRone 5 MG tablet Commonly known as: BUSPAR Take 1 tablet (5 mg total) by mouth 3 (three) times daily.   clopidogrel 75 MG tablet Commonly known as: PLAVIX Take 1 tablet (75 mg total) by mouth daily for 14 days. Start taking on: August 21, 2021   lipase/protease/amylase 12000-38000 units Cpep capsule Commonly known as: CREON Take 12,000 Units  by mouth 3 (three) times daily with meals.   losartan 100 MG tablet Commonly known as: COZAAR Take 1 tablet (100 mg total) by mouth daily. Start taking on: August 21, 2021   rosuvastatin 10 MG tablet Commonly known as: Crestor Take 1 tablet Daily for Cholesterol   sertraline 50 MG tablet Commonly known as: Zoloft Take 1 tablet (50 mg total) by mouth daily.   tamsulosin 0.4 MG Caps capsule Commonly known as: FLOMAX Take 1 capsule (0.4 mg total) by mouth daily after supper. For prostate.   Vitamin D3 125 MCG (5000 UT) Tabs Take 5,000 Units by mouth daily.        DG Chest 2 View  Result Date: 08/09/2021 CLINICAL DATA:  Status post fall.  Left shoulder pain. EXAM: CHEST - 2 VIEW COMPARISON:  None. FINDINGS: The heart size and mediastinal contours are within normal limits. Both lungs are clear. The visualized skeletal structures are unremarkable. IMPRESSION: No active cardiopulmonary disease. Electronically Signed   By: Kathreen Devoid M.D.   On: 08/09/2021 08:58   CT HEAD WO CONTRAST  Result Date:  08/09/2021 CLINICAL DATA:  [place] out of bed this morning, LEFT side weakness EXAM: CT HEAD WITHOUT CONTRAST CT CERVICAL SPINE WITHOUT CONTRAST TECHNIQUE: Multidetector CT imaging of the head and cervical spine was performed following the standard protocol without intravenous contrast. Multiplanar CT image reconstructions of the cervical spine were also generated. COMPARISON:  CT head 04/20/2021 FINDINGS: CT HEAD FINDINGS Brain: Generalized atrophy. Normal ventricular morphology. No midline shift or mass effect. Mild small vessel chronic ischemic changes of deep cerebral white matter. No intracranial hemorrhage, mass lesion, or evidence of acute infarction. No extra-axial fluid collections. Vascular: No hyperdense vessels. Skull: Intact Sinuses/Orbits: Clear Other: N/A CT CERVICAL SPINE FINDINGS Alignment: Minimal retrolisthesis at C3-C4 and C4-C5 likely degenerative. Remaining alignments normal. Skull base and vertebrae: Osseous mineralization normal. Multilevel disc space narrowing and endplate spur formation. Multilevel facet degenerative changes, mild. No fracture or bone destruction. No additional subluxation. Uncovertebral spurs encroach upon cervical neural foramina bilaterally at multiple levels. Soft tissues and spinal canal: Prevertebral soft tissues normal thickness. Remaining visualized cervical soft tissues unremarkable Disc levels:  No significant abnormalities. Upper chest: Lung apices emphysematous but clear Other: N/A IMPRESSION: Atrophy with small vessel chronic ischemic changes of deep cerebral white matter. No acute intracranial abnormalities. Multilevel degenerative disc and facet disease changes of the cervical spine as above. No acute cervical spine abnormalities. Electronically Signed   By: Lavonia Dana M.D.   On: 08/09/2021 10:30   CT Cervical Spine Wo Contrast  Result Date: 08/09/2021 CLINICAL DATA:  [place] out of bed this morning, LEFT side weakness EXAM: CT HEAD WITHOUT CONTRAST CT  CERVICAL SPINE WITHOUT CONTRAST TECHNIQUE: Multidetector CT imaging of the head and cervical spine was performed following the standard protocol without intravenous contrast. Multiplanar CT image reconstructions of the cervical spine were also generated. COMPARISON:  CT head 04/20/2021 FINDINGS: CT HEAD FINDINGS Brain: Generalized atrophy. Normal ventricular morphology. No midline shift or mass effect. Mild small vessel chronic ischemic changes of deep cerebral white matter. No intracranial hemorrhage, mass lesion, or evidence of acute infarction. No extra-axial fluid collections. Vascular: No hyperdense vessels. Skull: Intact Sinuses/Orbits: Clear Other: N/A CT CERVICAL SPINE FINDINGS Alignment: Minimal retrolisthesis at C3-C4 and C4-C5 likely degenerative. Remaining alignments normal. Skull base and vertebrae: Osseous mineralization normal. Multilevel disc space narrowing and endplate spur formation. Multilevel facet degenerative changes, mild. No fracture or bone destruction. No additional subluxation. Uncovertebral spurs  encroach upon cervical neural foramina bilaterally at multiple levels. Soft tissues and spinal canal: Prevertebral soft tissues normal thickness. Remaining visualized cervical soft tissues unremarkable Disc levels:  No significant abnormalities. Upper chest: Lung apices emphysematous but clear Other: N/A IMPRESSION: Atrophy with small vessel chronic ischemic changes of deep cerebral white matter. No acute intracranial abnormalities. Multilevel degenerative disc and facet disease changes of the cervical spine as above. No acute cervical spine abnormalities. Electronically Signed   By: Lavonia Dana M.D.   On: 08/09/2021 10:30   MR ANGIO HEAD WO CONTRAST  Result Date: 08/10/2021 CLINICAL DATA:  Initial evaluation for acute TIA. EXAM: MRA HEAD WITHOUT CONTRAST MRA NECK WITHOUT CONTRAST TECHNIQUE: Multiplanar, multi-echo pulse sequences of the brain and surrounding structures were acquired  without intravenous contrast. Angiographic images of the Circle of Willis were acquired using MRA technique without intravenous contrast. Angiographic images of the neck were acquired using MRA technique without intravenous contrast. Carotid stenosis measurements (when applicable) are obtained utilizing NASCET criteria, using the distal internal carotid diameter as the denominator. COMPARISON:  Comparison made with prior MRI from earlier the same day. FINDINGS: MRA HEAD FINDINGS Anterior circulation: Examination mildly degraded by motion artifact. Visualized distal cervical segments of the internal carotid arteries are patent with antegrade flow. Petrous segments patent bilaterally. Mild for age atheromatous irregularity within the carotid siphons without stenosis or other abnormality. A1 segments patent bilaterally. Normal anterior communicating artery complex. Anterior cerebral arteries demonstrate mild diffuse atheromatous irregularity but are patent without significant stenosis. No M1 stenosis or occlusion. Normal MCA bifurcations. Distal MCA branches well perfused and symmetric. Posterior circulation: Vertebral arteries are somewhat tortuous but widely patent to the vertebrobasilar junction. Left vertebral artery slightly dominant. Both PICA patent. Basilar tortuous but widely patent to its distal aspect without stenosis. Superior cerebellar arteries patent bilaterally. Both PCAs primarily supplied via the basilar well perfused to their distal aspects without stenosis. Anatomic variants: None significant. No aneurysm or other vascular abnormality. MRA NECK FINDINGS Aortic arch: Visualized aortic arch normal caliber with normal 3 vessel morphology. No stenosis or other abnormality about the origin of the great vessels. Right carotid system: Right common and internal carotid arteries patent without stenosis, evidence for dissection or occlusion. No significant atheromatous narrowing about the right carotid bulb.  Left carotid system: Left common and internal carotid arteries patent without stenosis, evidence for dissection or occlusion. No significant atheromatous narrowing about the left carotid bulb. Vertebral arteries: Both vertebral arteries arise from the subclavian arteries. Left vertebral artery slightly dominant. Vertebral arteries patent without stenosis, evidence for dissection or occlusion. Other: None IMPRESSION: Negative MRA of the head and neck. No large vessel occlusion or hemodynamically significant stenosis. Electronically Signed   By: Jeannine Boga M.D.   On: 08/10/2021 05:00   MR ANGIO NECK WO CONTRAST  Result Date: 08/10/2021 CLINICAL DATA:  Initial evaluation for acute TIA. EXAM: MRA HEAD WITHOUT CONTRAST MRA NECK WITHOUT CONTRAST TECHNIQUE: Multiplanar, multi-echo pulse sequences of the brain and surrounding structures were acquired without intravenous contrast. Angiographic images of the Circle of Willis were acquired using MRA technique without intravenous contrast. Angiographic images of the neck were acquired using MRA technique without intravenous contrast. Carotid stenosis measurements (when applicable) are obtained utilizing NASCET criteria, using the distal internal carotid diameter as the denominator. COMPARISON:  Comparison made with prior MRI from earlier the same day. FINDINGS: MRA HEAD FINDINGS Anterior circulation: Examination mildly degraded by motion artifact. Visualized distal cervical segments of the internal carotid arteries are  patent with antegrade flow. Petrous segments patent bilaterally. Mild for age atheromatous irregularity within the carotid siphons without stenosis or other abnormality. A1 segments patent bilaterally. Normal anterior communicating artery complex. Anterior cerebral arteries demonstrate mild diffuse atheromatous irregularity but are patent without significant stenosis. No M1 stenosis or occlusion. Normal MCA bifurcations. Distal MCA branches well  perfused and symmetric. Posterior circulation: Vertebral arteries are somewhat tortuous but widely patent to the vertebrobasilar junction. Left vertebral artery slightly dominant. Both PICA patent. Basilar tortuous but widely patent to its distal aspect without stenosis. Superior cerebellar arteries patent bilaterally. Both PCAs primarily supplied via the basilar well perfused to their distal aspects without stenosis. Anatomic variants: None significant. No aneurysm or other vascular abnormality. MRA NECK FINDINGS Aortic arch: Visualized aortic arch normal caliber with normal 3 vessel morphology. No stenosis or other abnormality about the origin of the great vessels. Right carotid system: Right common and internal carotid arteries patent without stenosis, evidence for dissection or occlusion. No significant atheromatous narrowing about the right carotid bulb. Left carotid system: Left common and internal carotid arteries patent without stenosis, evidence for dissection or occlusion. No significant atheromatous narrowing about the left carotid bulb. Vertebral arteries: Both vertebral arteries arise from the subclavian arteries. Left vertebral artery slightly dominant. Vertebral arteries patent without stenosis, evidence for dissection or occlusion. Other: None IMPRESSION: Negative MRA of the head and neck. No large vessel occlusion or hemodynamically significant stenosis. Electronically Signed   By: Jeannine Boga M.D.   On: 08/10/2021 05:00   MR BRAIN WO CONTRAST  Result Date: 08/09/2021 CLINICAL DATA:  Transient ischemic attack (TIA) EXAM: MRI HEAD WITHOUT CONTRAST TECHNIQUE: Multiplanar, multiecho pulse sequences of the brain and surrounding structures were obtained without intravenous contrast. COMPARISON:  CT in the same day. FINDINGS: Brain: No acute infarction, hemorrhage, hydrocephalus, extra-axial collection or mass lesion. Mild T2 hyperintensity in the white matter, nonspecific but compatible  with chronic microvascular ischemic disease that is very mild for patient age. Mild for age atrophy with mild prominence of the extra-axial spaces bilaterally. Vascular: Major arterial flow voids are maintained skull base Skull and upper cervical spine: Normal marrow signal. Sinuses/Orbits: Clear sinuses.  Unremarkable orbits. Other: No mastoid effusions. IMPRESSION: No evidence of acute intracranial abnormality, including acute infarct. Electronically Signed   By: Margaretha Sheffield M.D.   On: 08/09/2021 12:48   MR CERVICAL SPINE WO CONTRAST  Result Date: 08/10/2021 CLINICAL DATA:  Acute or progressive myelopathy EXAM: MRI CERVICAL SPINE WITHOUT CONTRAST TECHNIQUE: Multiplanar, multisequence MR imaging of the cervical spine was performed. No intravenous contrast was administered. COMPARISON:  CT from yesterday FINDINGS: Alignment: Slight retrolisthesis at C3-4 and C4-5. Vertebrae: No fracture, evidence of discitis, or bone lesion. Cord: Normal signal and morphology. Posterior Fossa, vertebral arteries, paraspinal tissues: Negative. Disc levels: C2-3: Narrow disc.  No impingement C3-4: Narrow disc with endplate and uncovertebral ridging. Moderate left foraminal stenosis C4-5: Disc narrowing and bulging with uncovertebral ridging. Right foraminal impingement and mild left foraminal stenosis C5-6: Disc narrowing and bulging with uncovertebral ridging. Moderate bilateral foraminal stenosis, greater on the right C6-7: Disc narrowing and bulging with uncovertebral ridging eccentric to the right. Biforaminal impingement worse on the right. C7-T1:Mild disc narrowing IMPRESSION: Ordinary cervical spine degeneration with multilevel foraminal stenosis as described. No cord impingement or signal abnormality to correlate with history of myelopathy. Electronically Signed   By: Jorje Guild M.D.   On: 08/10/2021 09:24   DG CHEST PORT 1 VIEW  Result Date: 08/09/2021 CLINICAL DATA:  Weakness, fall EXAM: PORTABLE CHEST 1  VIEW COMPARISON:  Radiograph 08/09/2021 FINDINGS: Unchanged cardiomediastinal silhouette. There is no focal airspace consolidation. There is no large pleural effusion or visible pneumothorax. There is no acute osseous abnormality. IMPRESSION: No evidence of acute cardiopulmonary disease. Electronically Signed   By: Maurine Simmering M.D.   On: 08/09/2021 14:33   DG Shoulder Left  Result Date: 08/09/2021 CLINICAL DATA:  Status post fall.  Left shoulder pain. EXAM: LEFT SHOULDER - 2+ VIEW COMPARISON:  None. FINDINGS: No fracture or dislocation. Tiny inferior humeral marginal osteophyte. Mild degenerative changes of the acromioclavicular joint. Soft tissues are normal. IMPRESSION: No acute osseous injury of the left shoulder. Electronically Signed   By: Kathreen Devoid M.D.   On: 08/09/2021 08:59   ECHOCARDIOGRAM COMPLETE  Result Date: 08/10/2021    ECHOCARDIOGRAM REPORT   Patient Name:   Javier [name] [facility] Date of Exam: 08/10/2021 Medical Rec #:  [id number]        Height:       72.0 in Accession #:    [id number]       Weight:       163.0 lb Date of Birth:  17-Apr-1941         BSA:          1.953 m Patient Age:    60 years         BP:           153/114 mmHg Patient Gender: M                HR:           74 bpm. Exam Location:  Inpatient Procedure: 2D Echo, Cardiac Doppler and Color Doppler Indications:    TIA  History:        Patient has prior history of Echocardiogram examinations, most                 recent 02/07/2020.  Sonographer:    Glo Herring Referring Phys: [id number] [place]  1. Normal LV function, all walls are not well visualized. Left ventricular ejection fraction, by estimation, is 60 to 65%. The left ventricle has normal function. Left ventricular diastolic parameters are consistent with Grade I diastolic dysfunction (impaired relaxation).  2. Right ventricular systolic function is normal. The right ventricular size is normal. Tricuspid regurgitation signal is inadequate for assessing PA  pressure.  3. The mitral valve was not well visualized. No evidence of mitral valve regurgitation.  4. The aortic valve was not well visualized. Aortic valve regurgitation is not visualized.  5. Small aortic root aneurysm 3.4 cm. Aortic dilatation noted.  6. The inferior vena cava is normal in size with greater than 50% respiratory variability, suggesting right atrial pressure of 3 mmHg. Conclusion(s)/Recommendation(s): Limited study, considerable artifact, LV function is normal. ?mass in RA likely eustachian valve. Appears calcified. No significant changes from prior echo 02/07/2020. FINDINGS  Left Ventricle: Normal LV function, all walls are not well visualized. Left ventricular ejection fraction, by estimation, is 60 to 65%. The left ventricle has normal function. The left ventricular internal cavity size was normal in size. Left ventricular diastolic parameters are consistent with Grade I diastolic dysfunction (impaired relaxation). Right Ventricle: The right ventricular size is normal. No increase in right ventricular wall thickness. Right ventricular systolic function is normal. Tricuspid regurgitation signal is inadequate for assessing PA pressure. Left Atrium: Left atrial size was normal in size. Right Atrium: Right atrial size was normal in size. Pericardium:  There is no evidence of pericardial effusion. Mitral Valve: The mitral valve was not well visualized. No evidence of mitral valve regurgitation. Tricuspid Valve: The tricuspid valve is not well visualized. Tricuspid valve regurgitation is not demonstrated. Aortic Valve: The aortic valve was not well visualized. Aortic valve regurgitation is not visualized. Aortic valve mean gradient measures 3.0 mmHg. Aortic valve peak gradient measures 6.1 mmHg. Aortic valve area, by VTI measures 3.06 cm. Pulmonic Valve: The pulmonic valve was not well visualized. Pulmonic valve regurgitation is trivial. Aorta: Small aortic root aneurysm 3.4 cm. Aortic dilatation  noted. Venous: The inferior vena cava is normal in size with greater than 50% respiratory variability, suggesting right atrial pressure of 3 mmHg. IAS/Shunts: The interatrial septum was not well visualized.  LEFT VENTRICLE PLAX 2D LVOT diam:     2.20 cm   Diastology LV SV:         67        LV e' medial:    5.66 cm/s LV SV Index:   34        LV E/e' medial:  13.7 LVOT Area:     3.80 cm  LV e' lateral:   5.98 cm/s                          LV E/e' lateral: 13.0  RIGHT VENTRICLE             IVC RV Basal diam:  3.50 cm     IVC diam: 1.70 cm RV S prime:     15.70 cm/s LEFT ATRIUM             Index        RIGHT ATRIUM           Index LA Vol (A2C):   37.1 ml 18.99 ml/m  RA Area:     18.70 cm LA Vol (A4C):   27.6 ml 14.13 ml/m  RA Volume:   53.90 ml  27.60 ml/m LA Biplane Vol: 31.6 ml 16.18 ml/m  AORTIC VALVE                    PULMONIC VALVE AV Area (Vmax):    2.95 cm     PV Vmax:       0.85 m/s AV Area (Vmean):   2.88 cm     PV Peak grad:  2.9 mmHg AV Area (VTI):     3.06 cm AV Vmax:           123.00 cm/s AV Vmean:          84.633 cm/s AV VTI:            0.219 m AV Peak Grad:      6.1 mmHg AV Mean Grad:      3.0 mmHg LVOT Vmax:         95.30 cm/s LVOT Vmean:        64.100 cm/s LVOT VTI:          0.176 m LVOT/AV VTI ratio: 0.80  AORTA Ao Root diam: 3.40 cm MITRAL VALVE MV Area (PHT): 3.33 cm    SHUNTS MV Decel Time: 228 msec    Systemic VTI:  0.18 m MV E velocity: 77.60 cm/s  Systemic Diam: 2.20 cm MV A velocity: 85.30 cm/s MV E/A ratio:  0.91 Mary Scientist, physiological signed by Phineas Inches Signature Date/Time: 08/10/2021/12:25:00 PM    Final    Results for orders placed or  performed during the hospital encounter of 08/09/21  Resp Panel by RT-PCR (Flu A&B, Covid) Nasopharyngeal Swab     Status: Abnormal   Collection Time: 08/09/21  8:28 AM   Specimen: Nasopharyngeal Swab; Nasopharyngeal(NP) swabs in vial transport medium  Result Value Ref Range Status   SARS Coronavirus 2 by RT PCR NEGATIVE NEGATIVE Final     Comment: (NOTE) SARS-CoV-2 target nucleic acids are NOT DETECTED.  The SARS-CoV-2 RNA is generally detectable in upper respiratory specimens during the acute phase of infection. The lowest concentration of SARS-CoV-2 viral copies this assay can detect is 138 copies/mL. A negative result does not preclude SARS-Cov-2 infection and should not be used as the sole basis for treatment or other patient management decisions. A negative result may occur with  improper specimen collection/handling, submission of specimen other than nasopharyngeal swab, presence of viral mutation(s) within the areas targeted by this assay, and inadequate number of viral copies(<138 copies/mL). A negative result must be combined with clinical observations, patient history, and epidemiological information. The expected result is Negative.  Fact Sheet for Patients:  EntrepreneurPulse.com.au  Fact Sheet for Healthcare Providers:  IncredibleEmployment.be  This test is no t yet approved or cleared by the [place] FDA and  has been authorized for detection and/or diagnosis of SARS-CoV-2 by FDA under an Emergency Use Authorization (EUA). This EUA will remain  in effect (meaning this test can be used) for the duration of the COVID-19 declaration under Section 564(b)(1) of the Act, 21 U.S.C.section 360bbb-3(b)(1), unless the authorization is terminated  or revoked sooner.       Influenza A by PCR POSITIVE (A) NEGATIVE Final   Influenza B by PCR NEGATIVE NEGATIVE Final    Comment: (NOTE) The Xpert Xpress SARS-CoV-2/FLU/RSV plus assay is intended as an aid in the diagnosis of influenza from Nasopharyngeal swab specimens and should not be used as a sole basis for treatment. Nasal washings and aspirates are unacceptable for Xpert Xpress SARS-CoV-2/FLU/RSV testing.  Fact Sheet for Patients: EntrepreneurPulse.com.au  Fact Sheet for Healthcare  Providers: IncredibleEmployment.be  This test is not yet approved or cleared by the [place] FDA and has been authorized for detection and/or diagnosis of SARS-CoV-2 by FDA under an Emergency Use Authorization (EUA). This EUA will remain in effect (meaning this test can be used) for the duration of the COVID-19 declaration under Section 564(b)(1) of the Act, 21 U.S.C. section 360bbb-3(b)(1), unless the authorization is terminated or revoked.  Performed at [facility], [place] [address] [place], [place] [address]     Signed:  Edwin Dada MD.  [facility] 08/20/2021, 10:19 AM

## 2021-08-20 NOTE — Assessment & Plan Note (Signed)
Continue losartan. 

## 2021-08-20 NOTE — Assessment & Plan Note (Addendum)
THis is generalized, diffuse, and present for months.  Does not have a diurnal variation to suggest MG.  Has had an extensive work-up with his outpatient doctor which is notable for normal TSH, B12, CK, PSA, CA 19-9, normal blood counts, normal liver function panel, and normal renal function and electrolytes.  He has had an unremarkable EGD/colonoscopy, normal chest x-ray, and CT abdomen pelvis which ruled out mass of the pancreas, liver, kidneys.  He has a very weakly positive ANA which is nonspecific and very likely to be related.  He has a moderately reduced pancreatic elastase, unclear significance, other testing ruled out celiac disease. - Given the extent of work up above, I suspect he has some degree of failure to thrive, cause not completely clear Achr

## 2021-08-20 NOTE — Progress Notes (Signed)
Occupational Therapy Treatment Patient Details Name: Javier [name] MRN: [id number] DOB: Mar 23, 1941 Today's Date: 08/20/2021   History of present illness Pt is an 80 y.o. male who presented 08/09/21 s/p fall with reports of L sided weakness. CT and MRI of head negative for acute intracranial processes. All other imaging also negative 11/11. Pt positive for influenza A. PMHx of "pre" DM II, HLD, HTN, CLL, vascular dementia, remote lacunar stroke, BPH, gastric AVM with weight loss, remote tobacco abuse, anxiety, and Vit D deficiency.   OT comments  Patient received seated in recliner and agreeable to OT treatment. Patient was able to walk to sink with RW and min assist and began brushing teeth and completed seated due to fatigue. Patient performed UB/LB bathing and dressing with limited time standing due to decreased standing tolerance on this date with patient tolerating less than 2 minutes for each standing attempt. Patient performed toilet transfer with RW and rail and was able to assist with clothing management. Patient continues to make good gains with OT treatment but had more bouts of fatigue while standing. Acute OT to continue to follow.    Recommendations for follow up therapy are one component of a multi-disciplinary discharge planning process, led by the attending physician.  Recommendations may be updated based on patient status, additional functional criteria and insurance authorization.    Follow Up Recommendations  Skilled nursing-short term rehab (<3 hours/day)    Assistance Recommended at Discharge Frequent or constant Supervision/Assistance  Equipment Recommendations  Other (comment)    Recommendations for Other Services      Precautions / Restrictions Precautions Precautions: Fall Restrictions Weight Bearing Restrictions: No       Mobility Bed Mobility               General bed mobility comments: seated in recliner    Transfers Overall transfer level:  Needs assistance Equipment used: Rolling walker (2 wheels);Rollator (4 wheels) Transfers: Sit to/from Stand;Bed to chair/wheelchair/BSC Sit to Stand: Min guard;Min assist Stand pivot transfers: Min assist         General transfer comment: min assist for safety and balance     Balance Overall balance assessment: Needs assistance;History of Falls Sitting-balance support: No upper extremity supported;Feet supported Sitting balance-Leahy Scale: Good     Standing balance support: No upper extremity supported;During functional activity;Single extremity supported Standing balance-Leahy Scale: Poor Standing balance comment: able to used one extremity for self care standing at sink                           ADL either performed or assessed with clinical judgement   ADL Overall ADL's : Needs assistance/impaired     Grooming: Wash/dry hands;Wash/dry face;Oral care;Standing;Min guard Grooming Details (indicate cue type and reason): min guard for balance Upper Body Bathing: Minimal assistance;Sitting Upper Body Bathing Details (indicate cue type and reason): assistance for back Lower Body Bathing: Moderate assistance;Sit to/from stand Lower Body Bathing Details (indicate cue type and reason): assistance for balance while standing Upper Body Dressing : Min guard;Sitting Upper Body Dressing Details (indicate cue type and reason): changed gown     Toilet Transfer: Minimal assistance;Rolling walker (2 wheels);Regular Glass blower/designer Details (indicate cue type and reason): verbal cues for safety Toileting- Clothing Manipulation and Hygiene: Minimal assistance;Sit to/from stand;Sitting/lateral lean Toileting - Clothing Manipulation Details (indicate cue type and reason): patient was min assist for clothing management with boxer briefs     Functional mobility during ADLs:  Minimal assistance;Rolling walker (2 wheels) General ADL Comments: patient required verbal cues for  sequencing for all ADL tasks    Extremity/Trunk Assessment Upper Extremity Assessment RUE Deficits / Details: Unable to flex shoulders above 90 degrees, overall weak and shaky with effort RUE Coordination: decreased fine motor;decreased gross motor LUE Deficits / Details: Unable to flex shoulders above 90 degrees, overall weak and shaky with effort LUE Coordination: decreased fine motor;decreased gross motor            Vision       Perception     Praxis      Cognition Arousal/Alertness: Awake/alert Behavior During Therapy: WFL for tasks assessed/performed Overall Cognitive Status: History of cognitive impairments - at baseline                                 General Comments: Patient stated he was disoriented on place when OT arrived          Exercises     Shoulder Instructions       General Comments      Pertinent Vitals/ Pain       Pain Assessment: Faces Faces Pain Scale: Hurts a little bit Pain Location: L hip, L knee Pain Descriptors / Indicators: Discomfort;Grimacing Pain Intervention(s): Monitored during session;Repositioned  Home Living                                          Prior Functioning/Environment              Frequency  Min 2X/week        Progress Toward Goals  OT Goals(current goals can now be found in the care plan section)  Progress towards OT goals: Progressing toward goals  Acute Rehab OT Goals Patient Stated Goal: move on his own OT Goal Formulation: With patient Time For Goal Achievement: 08/24/21 Potential to Achieve Goals: Good ADL Goals Pt Will Perform Grooming: with supervision;standing Pt Will Perform Lower Body Bathing: with min assist;sitting/lateral leans;sit to/from stand Pt Will Perform Lower Body Dressing: with min assist;sitting/lateral leans;sit to/from stand Pt Will Transfer to Toilet: with min assist;stand pivot transfer Pt Will Perform Toileting - Clothing Manipulation  and hygiene: with min assist;sitting/lateral leans;sit to/from stand Additional ADL Goal #1: Pt will participate in standing task for 5 mins to work on activity tolerance.  Plan Discharge plan remains appropriate    Co-evaluation                 AM-PAC OT "6 Clicks" Daily Activity     Outcome Measure   Help from another person eating meals?: A Little Help from another person taking care of personal grooming?: A Little Help from another person toileting, which includes using toliet, bedpan, or urinal?: A Little Help from another person bathing (including washing, rinsing, drying)?: A Lot Help from another person to put on and taking off regular upper body clothing?: A Little Help from another person to put on and taking off regular lower body clothing?: A Little 6 Click Score: 17    End of Session Equipment Utilized During Treatment: Gait belt;Rolling walker (2 wheels)  OT Visit Diagnosis: Unsteadiness on feet (R26.81);Other abnormalities of gait and mobility (R26.89);Muscle weakness (generalized) (M62.81);History of falling (Z91.81)   Activity Tolerance Patient tolerated treatment well   Patient Left in chair;with call bell/phone  within reach;with chair alarm set   Nurse Communication Mobility status        Time: [phone] OT Time Calculation (min): 19 min  Charges: OT General Charges $OT Visit: 1 Visit OT Treatments $Self Care/Home Management : 8-22 mins  Lodema Hong, Brutus  Pager [phone] Office [facility] 08/20/2021, 9:59 AM

## 2021-08-20 NOTE — TOC Transition Note (Signed)
Transition of Care [facility]) - CM/SW Discharge Note   Patient Details  Name: Javier [name] MRN: [id number] Date of Birth: May 20, 1941  [facility] CM/SW Contact:  Geralynn Ochs, LCSW Phone Number: 08/20/2021, 11:18 AM   Clinical Narrative:   Nurse to call report to [phone], Room 103P.    Final next level of care: Skilled Nursing Facility Barriers to Discharge: Barriers Resolved   Patient Goals and CMS Choice Patient states their goals for this hospitalization and ongoing recovery are:: Pt unable to participate in goal setting at this time. CMS Medicare.gov Compare Post Acute Care list provided to:: Patient Represenative (must comment) (Spouse) Choice offered to / list presented to : Patient, Spouse, Adult Children  Discharge Placement              Patient chooses bed at: [facility] Patient to be transferred to facility by: [facility] Name of family member notified: [name] Patient and family notified of of transfer: 08/20/21  Discharge Plan and Services     Post Acute Care Choice: [facility]                               Social Determinants of Health (SDOH) Interventions     Readmission Risk Interventions No flowsheet data found.

## 2021-08-20 NOTE — Assessment & Plan Note (Signed)
Possible, not likely but not ruled out.  TIA work up unremarkable, Neurology recommend:  - aspirin and Plavix DAPT for 3 weeks and then resume aspirin 81.  - Increase Crestor 10 to20 for HLD.

## 2021-08-20 NOTE — Assessment & Plan Note (Signed)
In remission.

## 2021-08-20 NOTE — Assessment & Plan Note (Signed)
Comleted Tamiflu.  Symptoms resolved.

## 2021-08-20 NOTE — Progress Notes (Signed)
Physical Therapy Treatment Patient Details Name: Javier [name] MRN: [id number] DOB: 07/29/1941 Today's Date: 08/20/2021   History of Present Illness Pt is an 80 y.o. male who presented 08/09/21 s/p fall with reports of L sided weakness. CT and MRI of head negative for acute intracranial processes. All other imaging also negative 11/11. Pt positive for influenza A. PMHx of "pre" DM II, HLD, HTN, CLL, vascular dementia, remote lacunar stroke, BPH, gastric AVM with weight loss, remote tobacco abuse, anxiety, and Vit D deficiency.    PT Comments    Pt received in supine, agreeable to therapy session and with good participation and tolerance for gait and transfer training. Emphasis on safe UE placement with transfers, upright seated/standing posture, activity pacing, standing balance and safe use of RW. Pt spouse present and had questions for case manager and RN prior to discharge, staff notified. Pt continues to benefit from PT services to progress toward functional mobility goals.    Recommendations for follow up therapy are one component of a multi-disciplinary discharge planning process, led by the attending physician.  Recommendations may be updated based on patient status, additional functional criteria and insurance authorization.  Follow Up Recommendations  Skilled nursing-short term rehab (<3 hours/day)     Assistance Recommended at Discharge Frequent or constant Supervision/Assistance  Equipment Recommendations  BSC/3in1;Rolling walker (2 wheels);Other (comment) (tub bench (edited for clarity per discussion with PT previous date))    Recommendations for Other Services       Precautions / Restrictions Precautions Precautions: Fall Precaution Comments: dementia Restrictions Weight Bearing Restrictions: No     Mobility  Bed Mobility Overal bed mobility: Needs Assistance Bed Mobility: Supine to Sit     Supine to sit: HOB elevated;Min guard     General bed mobility  comments: increased time, use of railing    Transfers Overall transfer level: Needs assistance Equipment used: Rolling walker (2 wheels);Rollator (4 wheels) Transfers: Sit to/from Stand;Bed to chair/wheelchair/BSC Sit to Stand: Min assist Stand pivot transfers: Min assist         General transfer comment: from EOB and chair heights, able to stand x5 reps x 2 sets from chair with rest break between sets and increased time to perform. >60 seconds to perform STS x 5    Ambulation/Gait Ambulation/Gait assistance: Min assist;Mod assist Gait Distance (Feet): 25 Feet Assistive device: Rolling walker (2 wheels) Gait Pattern/deviations: Decreased stride length;Trunk flexed;Leaning posteriorly;Step-to pattern Gait velocity: decreased     General Gait Details: frequent postural cues, chair follow due to unsteadiness, single episode LOB needing modA to correct, otherwise minA for external support and steadying assist, dense cues for RW use/safety   Stairs             Wheelchair Mobility    Modified Rankin (Stroke Patients Only) Modified Rankin (Stroke Patients Only) Pre-Morbid Rankin Score: Moderate disability Modified Rankin: Moderately severe disability     Balance Overall balance assessment: Needs assistance;History of Falls Sitting-balance support: No upper extremity supported;Feet supported Sitting balance-Leahy Scale: Good     Standing balance support: No upper extremity supported;During functional activity;Single extremity supported Standing balance-Leahy Scale: Poor Standing balance comment: 0-1 UE for static standing tasks at sink (hand washing) needs BUE support during gait and some external assist for balance with RW                            Cognition Arousal/Alertness: Awake/alert Behavior During Therapy: [facility] for tasks assessed/performed Overall Cognitive  Status: History of cognitive impairments - at baseline                                  General Comments: Slow processing but good following of 1-step commands. Spouse present and encouraging.        Exercises Other Exercises Other Exercises: STS x5 reps x 2 sets    General Comments General comments (skin integrity, edema, etc.): VSS on RA      Pertinent Vitals/Pain Pain Assessment: Faces Faces Pain Scale: Hurts a little bit Pain Location: L hip, L knee Pain Descriptors / Indicators: Grimacing Pain Intervention(s): Monitored during session;Repositioned     PT Goals (current goals can now be found in the care plan section) Acute Rehab PT Goals Patient Stated Goal: to get stronger before going home PT Goal Formulation: With patient Time For Goal Achievement: 08/23/21 Progress towards PT goals: Progressing toward goals    Frequency    Min 3X/week      PT Plan Current plan remains appropriate       AM-PAC PT "6 Clicks" Mobility   Outcome Measure  Help needed turning from your back to your side while in a flat bed without using bedrails?: A Little Help needed moving from lying on your back to sitting on the side of a flat bed without using bedrails?: A Little Help needed moving to and from a bed to a chair (including a wheelchair)?: A Little Help needed standing up from a chair using your arms (e.g., wheelchair or bedside chair)?: A Lot (mod cues for all items below) Help needed to walk in hospital room?: A Lot Help needed climbing 3-5 steps with a railing? : Total 6 Click Score: 14    End of Session Equipment Utilized During Treatment: Gait belt Activity Tolerance: Patient tolerated treatment well;Patient limited by fatigue Patient left: with family/visitor present;in chair;with call bell/phone within reach;with chair alarm set Nurse Communication: Mobility status (spouse has questions for case mgr, CM notified) PT Visit Diagnosis: Unsteadiness on feet (R26.81);Other abnormalities of gait and mobility (R26.89);Muscle weakness (generalized)  (M62.81);History of falling (Z91.81);Difficulty in walking, not elsewhere classified (R26.2)     Time: 6945-0388 PT Time Calculation (min) (ACUTE ONLY): 15 min  Charges:  $Gait Training: 8-22 mins                     Raekwan Spelman P., PTA Acute Rehabilitation Services Pager: [phone] Office: [employer] 08/20/2021, 2:38 PM

## 2021-08-21 LAB — ACETYLCHOLINE RECEPTOR AB, ALL
Acety choline binding ab: <0.03 nmol/L (ref 0.00–0.24)
Acetylchol Block Ab: 15 % (ref 0–25)

## 2021-08-23 DIAGNOSIS — F0150 Vascular dementia without behavioral disturbance: Secondary | ICD-10-CM | POA: Diagnosis not present

## 2021-08-23 DIAGNOSIS — J101 Influenza due to other identified influenza virus with other respiratory manifestations: Secondary | ICD-10-CM | POA: Diagnosis not present

## 2021-08-23 DIAGNOSIS — I10 Essential (primary) hypertension: Secondary | ICD-10-CM | POA: Diagnosis not present

## 2021-08-23 DIAGNOSIS — D696 Thrombocytopenia, unspecified: Secondary | ICD-10-CM | POA: Diagnosis not present

## 2021-08-23 DIAGNOSIS — F39 Unspecified mood [affective] disorder: Secondary | ICD-10-CM | POA: Diagnosis not present

## 2021-08-23 DIAGNOSIS — R5381 Other malaise: Secondary | ICD-10-CM | POA: Diagnosis not present

## 2021-08-23 DIAGNOSIS — C9111 Chronic lymphocytic leukemia of B-cell type in remission: Secondary | ICD-10-CM | POA: Diagnosis not present

## 2021-08-23 DIAGNOSIS — G459 Transient cerebral ischemic attack, unspecified: Secondary | ICD-10-CM | POA: Diagnosis not present

## 2021-08-23 DIAGNOSIS — G4700 Insomnia, unspecified: Secondary | ICD-10-CM | POA: Diagnosis not present

## 2021-08-26 NOTE — Progress Notes (Deleted)
FOLLOW UP  Assessment and Plan:  Javier [name] was seen today for follow-up.  Diagnoses and all orders for this visit:  Essential hypertension  - continue medications, DASH diet, exercise and monitor at home. Call if greater than 130/80.   Reminder to go to the ER if any CP, SOB, nausea, dizziness, severe HA, changes vision/speech, left arm numbness and tingling and jaw pain.  Gastric AVM/ BMI 22/ 15 weight loss unintentional in 1 month -     Ambulatory referral to Gastroenterology, urgent -   Pt and wife advised to go to ER immediately if he devleops any of th following You vomit blood. You faint. You have blood in your stools. You develop jaundice. You have severe cramps in your back or abdomen. Your symptoms of upper GI bleeding come back after treatment.  Mixed hyperlipidemia -     COMPLETE METABOLIC PANEL WITH GFR -     Lipid panel  Vascular dementia with behavior disturbance (HCC)  Continue current medications and home health , monitor symptoms.  Gastroesophageal reflux disease, unspecified whether esophagitis present/Diverticulosis  Referred to Dr. Earlean Shawl for further evaluation Not taking meds at this time Discussed diet, avoiding triggers and other lifestyle changes  CLL (chronic lymphocytic leukemia) ([place]) -     CBC with Differential/Platelet  managed by Dr. Susanne Greenhouse x 1 year completed Monitor   Abnormal glucose -     Hemoglobin A1c  Anxiety  Wife states he stays up and paces al night long and will go out on porch in the middle of the night.  Noticed no difference with Seroquel so he has not been taking that medication  Home nurse called and states the patient is using Xanax during the day as well so sleeps during the day and is requesting different medication for daytime. Will start on Sertraline 50 mg qd and Buspar 5 mg 1 tab q 8 hours as needed during the day for anxiety. Spoke with wife and clarified he should only use Xanax at night.   Continue diet and  meds as discussed. Further disposition pending results of labs. Discussed med's effects and SE's.   Over 30 minutes of exam, counseling, chart review, and critical decision making was performed.   Future Appointments  Date Time Provider [place]  08/27/2021  2:30 PM Magda Bernheim, NP [facility] None  10/29/2021  1:30 PM Marcial Pacas, MD [facility] None  01/22/2022  2:00 PM Unk Pinto, MD [facility] None    ----------------------------------------------------------------------------------------------------------------------  HPI 80 y.o. male  presents for 3 month follow up on hypertension, cholesterol, diabetes, weight and vitamin D deficiency.   BMI is There is no height or weight on file to calculate BMI., he has not been working on diet and exercise. His eating has dramatically decreased, eating very little.  Drinks a lot of water. Lots of burping and very little food intake.  Denies nausea/vomiting.  Wt Readings from Last 3 Encounters:  05/21/21 163 lb (73.9 kg)  04/20/21 178 lb (80.7 kg)  03/28/21 163 lb (73.9 kg)   Increased anxiety at night and will go out doors and sit on the porch.  Up and down all night long, feels extremely anxious. He called  911 last week and then did not know he called..A home nursing aide comes out once a week, nurse once a week and physical therapy.   His blood pressure has been controlled at home  running in 120-130/70-80, today their BP is  . He did not take his  BP medicine this morning.  BP Readings from Last 3 Encounters:  08/20/21 (!) 154/73  05/21/21 (!) 148/78  04/20/21 (!) 151/79    He does not workout. He denies chest pain, shortness of breath, dizziness.   He is on cholesterol medication Rosuvastatin and denies myalgias. His cholesterol is not at goal. The cholesterol last visit was:   Lab Results  Component Value Date   CHOL 174 08/10/2021   HDL 55 08/10/2021   LDLCALC 103 (H) 08/10/2021   TRIG 79 08/10/2021   CHOLHDL 3.2  08/10/2021    He has not been working on diet and exercise for prediabetes. Last A1C in the office was:  Lab Results  Component Value Date   HGBA1C 5.6 08/10/2021       Current Medications:  Current Outpatient Medications on File Prior to Visit  Medication Sig   ALPRAZolam (XANAX) 0.5 MG tablet Take 1/2 - 1 tablet 2 x /day ONLY if needed for Anxiety Attack &  limit to 5 days /week to avoid Addiction & Dementia / - Recommend use sparingly - NOT ROUTINE !  - Rx to last 1 month   aspirin EC 81 MG tablet Take 81 mg by mouth daily.   busPIRone (BUSPAR) 5 MG tablet Take 1 tablet (5 mg total) by mouth 3 (three) times daily.   Cholecalciferol (VITAMIN D3) 5000 units TABS Take 5,000 Units by mouth daily.   clopidogrel (PLAVIX) 75 MG tablet Take 1 tablet (75 mg total) by mouth daily for 14 days.   lipase/protease/amylase (CREON) 12000-38000 units CPEP capsule Take 12,000 Units by mouth 3 (three) times daily with meals.   losartan (COZAAR) 100 MG tablet Take 1 tablet (100 mg total) by mouth daily.   rosuvastatin (CRESTOR) 10 MG tablet Take 1 tablet Daily for Cholesterol   sertraline (ZOLOFT) 50 MG tablet Take 1 tablet (50 mg total) by mouth daily.   tamsulosin (FLOMAX) 0.4 MG CAPS capsule Take 1 capsule (0.4 mg total) by mouth daily after supper. For prostate.   No current facility-administered medications on file prior to visit.     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  HENT:  Negative for congestion, sore throat and tinnitus.   Eyes:  Negative for blurred vision.  Respiratory:  Positive for shortness of breath. Negative for cough and hemoptysis.   Cardiovascular:  Negative for chest pain, palpitations, orthopnea and leg swelling.  Gastrointestinal:  Positive for heartburn. Negative for abdominal pain, blood in stool, constipation, diarrhea, nausea and vomiting.       Burping and 15 pound weight loss in 1 month  Musculoskeletal:  Negative for back pain, joint pain and myalgias.  Skin:  Positive for rash.  Neurological:  Negative for dizziness.  Psychiatric/Behavioral:  Positive for memory loss. The patient is nervous/anxious and has insomnia.      Physical Exam: There were no vitals taken for this  visit. Wt Readings from Last 3 Encounters:  05/21/21 163 lb (73.9 kg)  04/20/21 178 lb (80.7 kg)  03/28/21 163 lb (73.9 kg)   General Appearance: Very thin, in no apparent distress. Eyes: PERRLA, EOMs, conjunctiva no swelling or erythema Sinuses: No Frontal/maxillary tenderness ENT/Mouth: Ext aud canals clear, TMs without erythema, bulging. No erythema, swelling, or exudate on post pharynx.  Tonsils not swollen or erythematous. Hearing normal.  Neck: Supple, thyroid normal.  Respiratory: Respiratory effort normal, BS equal bilaterally without rales, rhonchi, wheezing or stridor.  Cardio: RRR with no MRGs. Brisk peripheral pulses without edema.  Abdomen: Soft, + BS.  Non tender, no guarding, rebound, hernias, masses. Lymphatics: Non tender without lymphadenopathy.  Musculoskeletal: Full ROM, Decreased strength Shuffling gait walks with cane Skin: Warm, dry without rashes, lesions, ecchymosis.  Neuro: Cranial nerves intact. No cerebellar symptoms.  Psych: Awake and oriented to person,  normal affect,    Adrijana Haros Kathyrn Drown, NP 9:52 AM [facility]

## 2021-08-27 ENCOUNTER — Ambulatory Visit (INDEPENDENT_AMBULATORY_CARE_PROVIDER_SITE_OTHER): Payer: Medicare HMO | Admitting: Internal Medicine

## 2021-08-27 ENCOUNTER — Other Ambulatory Visit: Payer: Self-pay

## 2021-08-27 ENCOUNTER — Inpatient Hospital Stay: Payer: Medicare HMO | Admitting: Nurse Practitioner

## 2021-08-27 ENCOUNTER — Encounter: Payer: Self-pay | Admitting: Internal Medicine

## 2021-08-27 VITALS — BP 138/70 | HR 88 | Temp 97.9°F | Resp 16 | Ht 71.0 in | Wt 153.0 lb

## 2021-08-27 DIAGNOSIS — C9110 Chronic lymphocytic leukemia of B-cell type not having achieved remission: Secondary | ICD-10-CM

## 2021-08-27 DIAGNOSIS — E559 Vitamin D deficiency, unspecified: Secondary | ICD-10-CM

## 2021-08-27 DIAGNOSIS — F01B18 Vascular dementia, moderate, with other behavioral disturbance: Secondary | ICD-10-CM | POA: Diagnosis not present

## 2021-08-27 DIAGNOSIS — I10 Essential (primary) hypertension: Secondary | ICD-10-CM | POA: Diagnosis not present

## 2021-08-27 DIAGNOSIS — K8689 Other specified diseases of pancreas: Secondary | ICD-10-CM

## 2021-08-27 DIAGNOSIS — G459 Transient cerebral ischemic attack, unspecified: Secondary | ICD-10-CM

## 2021-08-27 DIAGNOSIS — Z79899 Other long term (current) drug therapy: Secondary | ICD-10-CM

## 2021-08-27 DIAGNOSIS — M25511 Pain in right shoulder: Secondary | ICD-10-CM | POA: Diagnosis not present

## 2021-08-27 MED ORDER — DEXAMETHASONE SODIUM PHOSPHATE 10 MG/ML IJ SOLN
10.0000 mg | Freq: Once | INTRAMUSCULAR | Status: AC
Start: 2021-08-27 — End: 2021-08-27
  Administered 2021-08-27: 10 mg via INTRAMUSCULAR

## 2021-08-27 NOTE — Progress Notes (Signed)
Future Appointments  Date Time Provider Department  10/29/2021  1:30 PM Marcial Pacas, MD [facility]  01/22/2022  2:00 PM Unk Pinto, MD [facility] Follow-Up     This very nice 80 y.o. MBM was admitted to the hospital on  08/09/2021 and patient was discharged from the hospital 7 days ago on 08/20/2021 and was transferred to [facility] for 3 days and now home 4 days. The patient now presents for follow up for transition from recent hospitalization & SNF stay.  The day after discharge  our clinical staff contacted the patient to assure stability and schedule a follow up appointment. The discharge summary, medications and diagnostic test results were reviewed before meeting with the patient. The patient was admitted for:   TIA (transient ischemic attack) 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 CLL (chronic lymphocytic leukemia) ([place]) Pancreatic insufficiency Essential hypertension      Patient is a very nice 80 yo MBM with long hx/o progressive mental deterioration and increased frequent falls was admitted thru the ER with acute worsening mental status and had extensive neuro w/u  (& pre-hospital psychiatric  evals) in the hospital with final dx of TIA. Post hospital , patient was discharged to [facility] for        Hospitalization discharge instructions and medications are reconciled with the patient & wife.       Patient is also followed with Hypertension, Hyperlipidemia, Pre-Diabetes, CLL (Remission) and Vitamin D Deficiency.  Today , patient & wife also relate several months hx/o Rt shoulder pain with abduction & int/ext rotation.      Patient is treated for HTN & BP has been controlled at home. Today's BP is at goal -  138/70. Patient has had no complaints of any cardiac type chest pain, palpitations, dyspnea/orthopnea/PND, dizziness, claudication, or dependent edema.     Hyperlipidemia is near controlled with diet & Rosuvastatin. Patient denies  myalgias or other med SE's. Last Lipids were near goal :  Lab Results  Component Value Date   CHOL 174 08/10/2021   HDL 55 08/10/2021   LDLCALC 103 (H) 08/10/2021   TRIG 79 08/10/2021   CHOLHDL 3.2 08/10/2021      Also, the patient has history of T2_NIDDM PreDiabetes and has had no symptoms of reactive hypoglycemia, diabetic polys, paresthesias or visual blurring.  Last A1c was normal & at goal :  Lab Results  Component Value Date   HGBA1C 5.6 08/10/2021      Further, the patient also has history of Vitamin D Deficiency and supplements vitamin D without any suspected side-effects. Last vitamin D was  at goal :  Lab Results  Component Value Date   VD25OH 58 01/09/2021   Current Outpatient Medications on File Prior to Visit  Medication Sig   aspirin EC 81 MG tablet Take  daily.   busPIRone 5 MG tablet Take 1 tablet 3 times daily.   VITAMIN D 5000 u Take daily.   CREON 12000-38000 units  capsule Take 12,000 Units 3  times daily with meals.   losartan  100 MG tablet Take 1 tablet  daily.   rosuvastatin  10 MG tablet Take 1 tablet Daily for Cholesterol   tamsulosin  0.4 MG CAPS capsule Take 1 capsule daily after supper   sertraline 50 MG tablet Take 1 tablet  daily.    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   PMHx:   Past Medical History:  Diagnosis Date   Diverticulosis    Hyperlipidemia    Hypertension    Memory loss    Prediabetes    Vitamin D deficiency    Immunization History  Administered Date(s) Administered   Influenza Split 07/28/2013   Influenza, High Dose  09/11/2015, 06/09/2016, 09/15/2018   PFIZER Covid-19 Tri-Sucrose Vacc 11/05/2019, 11/26/2019, 08/18/2020, 04/19/2021   Pneumococcal - 13 11/05/2017   Pneumococcal - 23 07/01/1999, 09/20/2009   Td 09/20/2009   Zoster, Live 11/08/2009   Past Surgical  History:  Procedure Laterality Date   NO PAST SURGERIES     FHx:    Reviewed / unchanged  SHx:    Reviewed / unchanged  Systems Review:  Constitutional: Denies fever, chills, wt changes, headaches, insomnia, fatigue, night sweats, change in appetite. Eyes: Denies redness, blurred vision, diplopia, discharge, itchy, watery eyes.  ENT: Denies discharge, congestion, post nasal drip, epistaxis, sore throat, earache, hearing loss, dental pain, tinnitus, vertigo, sinus pain, snoring.  CV: Denies chest pain, palpitations, irregular heartbeat, syncope, dyspnea, diaphoresis, orthopnea, PND, claudication or edema. Respiratory: denies cough, dyspnea, DOE, pleurisy, hoarseness, laryngitis, wheezing.  Gastrointestinal: Denies dysphagia, odynophagia, heartburn, reflux, water brash, abdominal pain or cramps, nausea, vomiting, bloating, diarrhea, constipation, hematemesis, melena, hematochezia  or hemorrhoids. Genitourinary: Denies dysuria, frequency, urgency, nocturia, hesitancy, discharge, hematuria or flank pain. Musculoskeletal: Denies arthralgias, myalgias, stiffness, jt. swelling, pain, limping or strain/sprain.  Skin: Denies pruritus, rash, hives, warts, acne, eczema or change in skin lesion(s). Neuro: No weakness, tremor, incoordination, spasms, paresthesia or pain. Psychiatric: Denies confusion, memory loss or sensory loss. Endo: Denies change in weight, skin or hair change.  Heme/Lymph: No excessive bleeding, bruising or enlarged lymph nodes.  Physical Exam  BP 138/70   Pulse 88   Temp 97.9 F (36.6 C)   Resp 16   Ht 5\' 11"  (1.803 m)   Wt 153 lb (69.4 kg)   SpO2 97%   BMI 21.34 kg/m   Appears adequately nourished and in no distress.  Eyes: PERRLA, EOMs, conjunctiva no swelling or erythema. Sinuses: No frontal/maxillary tenderness ENT/Mouth: EAC's clear, TM's nl w/o erythema, bulging. Nares clear w/o erythema, swelling, exudates. Oropharynx clear without erythema or exudates. Oral  hygiene is good. Tongue normal, non obstructing. Hearing intact.  Neck: Supple. Thyroid nl. Car 2+/2+ without bruits, nodes or JVD. Chest: Respirations nl with BS clear & equal w/o rales, rhonchi, wheezing or stridor.  Cor: Heart sounds normal w/ regular rate and rhythm without sig. murmurs, gallops, clicks or rubs. Peripheral pulses normal and equal  without edema.  Abdomen: Soft & bowel sounds normal. Non-tender w/o guarding, rebound, hernias, masses or organomegaly.  Lymphatics: Unremarkable.  Musculoskeletal:  Rt shoulder -decreased abduction & Int /external rotation 2 0 pain . Gait sl broadbased and unsteady.  Skin: Warm, dry without exposed rashes, lesions or ecchymosis apparent.  Neuro: Cranial nerves intact, reflexes equal bilaterally. Sensory-motor testing grossly intact. Tendon reflexes grossly intact.  (+) snout & palmo-mental reflexes.  Pysch: Alert & oriented x 2.  Insight and judgement limited.   Assessment and Plan:   1. TIA (transient ischemic attack)   2. 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   3. CLL (chronic lymphocytic leukemia) (HCC)  - CBC with Differential/Platelet  4. Pancreatic insufficiency   5. Essential hypertension  - Continue medication, monitor blood pressure at home.  - Continue DASH diet.  Reminder to go to the ER if any CP,  SOB, nausea, dizziness, severe HA, changes vision/speech.   - CBC with Differential/Platelet - COMPLETE METABOLIC PANEL WITH GFR - TSH  6. Vitamin D deficiency  - Continue supplementation.  - VITAMIN D 25 Hydroxy   7. Medication management  - CBC with Differential/Platelet - COMPLETE METABOLIC PANEL WITH GFR - TSH - VITAMIN D 25 Hydroxy  8. Right shoulder pain  - dexamethasone  injection 10 mg        Discussed  regular exercise, BP monitoring, weight control to achieve/maintain BMI less than 25 and discussed meds and SE's. Recommended labs to assess and monitor clinical status with  further disposition pending results of labs. Over 30 minutes of exam, counseling, chart review was performed.   Kirtland Bouchard, MD

## 2021-08-27 NOTE — Patient Instructions (Signed)
Due to recent changes in healthcare laws, you may see the results of your imaging and laboratory studies on MyChart before your provider has had a chance to review them.  We understand that in some cases there may be results that are confusing or concerning to you. Not all laboratory results come back in the same time frame and the provider may be waiting for multiple results in order to interpret others.  Please give [place] 48 hours in order for your provider to thoroughly review all the results before contacting the office for clarification of your results.   +++++++++++++++++++++++++++++++  Dementia  Dementia is a condition that affects the way the brain functions. It often affects memory and thinking. Usually, dementia gets worse with time and cannot be reversed (progressive dementia). There are many types of dementia, including: Alzheimer's disease. This type is the most common. Vascular dementia. This type may happen as the result of a stroke. Lewy body dementia. This type may happen to people who have Parkinson's disease. Frontotemporal dementia. This type is caused by damage to nerve cells (neurons) in certain parts of the brain. Some people may be affected by more than one type of dementia. This is called mixed dementia. What are the causes? Dementia is caused by damage to cells in the brain. The area of the brain and the types of cells damaged determine the type of dementia. Usually, this damage is irreversible or cannot be undone. Some examples of irreversible causes include: Conditions that affect the blood vessels of the brain, such as diabetes, heart disease, or blood vessel disease. Genetic mutations. In some cases, changes in the brain may be caused by another condition and can be reversed or slowed. Some examples of reversible causes include: Injury to the brain. Certain medicines. Infection, such as meningitis. Metabolic problems, such as vitamin B12 deficiency or thyroid  disease. Pressure on the brain, such as from a tumor, blood clot, or too much fluid in the brain (hydrocephalus). Autoimmune diseases that affect the brain or arteries, such as limbic encephalitis or vasculitis. What are the signs or symptoms? Symptoms of dementia depend on the type of dementia. Common signs of dementia include problems with remembering, thinking, problem solving, decision making, and communicating. These signs develop slowly or get worse with time. This may include: Problems remembering events or people. Having trouble taking a bath or putting clothes on. Forgetting appointments or forgetting to pay bills. Difficulty planning and preparing meals. Having trouble speaking. Getting lost easily. Changes in behavior or mood. How is this diagnosed? This condition is diagnosed by a specialist (neurologist). It is diagnosed based on the history of your symptoms, your medical history, a physical exam, and tests. Tests may include: Tests to evaluate brain function, such as memory tests, cognitive tests, and other tests. Lab tests, such as blood or urine tests. Imaging tests, such as a CT scan, a PET scan, or an MRI. Genetic testing. This may be done if other family members have a diagnosis of certain types of dementia. Your health care provider will talk with you and your family, friends, or caregivers about your history and symptoms. How is this treated? Treatment for this condition depends on the cause of the dementia. Progressive dementias, such as Alzheimer's disease, cannot be cured, but there may be treatments that help to manage symptoms. Treatment might involve taking medicines that may help to: Control the dementia. Slow down the progression of the dementia. Manage symptoms. In some cases, treating the cause of your  dementia can improve symptoms, reverse symptoms, or slow down how quickly your dementia becomes worse. Your health care provider can direct you to support  groups, organizations, and other health care providers who can help with decisions about your care. Follow these instructions at home: Medicines Take over-the-counter and prescription medicines only as told by your health care provider. Use a pill organizer or pill reminder to help you manage your medicines. Avoid taking medicines that can affect thinking, such as pain medicines or sleeping medicines. Lifestyle Make healthy lifestyle choices. Be physically active as told by your health care provider. Do not use any products that contain nicotine or tobacco, such as cigarettes, e-cigarettes, and chewing tobacco. If you need help quitting, ask your health care provider. Do not drink alcohol. Practice stress-management techniques when you get stressed. Spend time with other people. Make sure to get quality sleep. These tips can help you get a good night's rest: Avoid napping during the day. Keep your sleeping area dark and cool. Avoid exercising during the few hours before you go to bed. Avoid caffeine products in the evening. Eating and drinking Drink enough fluid to keep your urine pale yellow. Eat a healthy diet. General instructions  Work with your health care provider to determine what you need help with and what your safety needs are. Talk with your health care provider about whether it is safe for you to drive. If you were given a bracelet that identifies you as a person with memory loss or tracks your location, make sure to wear it at all times. Work with your family to make important decisions, such as advance directives, medical power of attorney, or a living will. Keep all follow-up visits. This is important. Where to find more information Alzheimer's Association: [employer].co.nz National Institute on Aging: DVDEnthusiasts.nl World Health Organization: RoleLink.com.br Contact a health care provider if: You have any new or worsening symptoms. You have problems with choking  or swallowing. Get help right away if: You feel depressed or sad, or feel that you want to harm yourself. Your family members become concerned for your safety. If you ever feel like you may hurt yourself or others, or have thoughts about taking your own life, get help right away. Go to your nearest emergency department or: Call your local emergency services (911 in the [place] Call a suicide crisis helpline, such as the [facility] at 708 615 1883 or 988 in the [place]. This is open 24 hours a day in the [place] Text the Crisis Text Line at (904)647-8638 (in the [place].). Summary Dementia is a condition that affects the way the brain functions. Dementia often affects memory and thinking. Usually, dementia gets worse with time and cannot be reversed (progressive dementia). Treatment for this condition depends on the cause of the dementia. Work with your health care provider to determine what you need help with and what your safety needs are. Your health care provider can direct you to support groups, organizations, and other health care providers who can help with decisions about your care. +++++++++++++++++++++++++++++++  Dementia Caregiver Guide                                                                         Dementia  is a term used to describe a number of symptoms that affect memory and thinking. The most common symptoms include: Memory loss. Trouble with language and communication. Trouble concentrating. Poor judgment and problems with reasoning. Wandering from home or public places. Extreme anxiety or depression. Being suspicious or having angry outbursts and accusations. Child-like behavior and language. Dementia can be frightening and confusing. And taking care of someone with dementia can be challenging. This guide provides tips to help you when providing care for a person with dementia. How to help manage lifestyle changes Dementia usually gets worse slowly over  time. In the early stages, people with dementia can stay independent and safe with some help. In later stages, they need help with daily tasks such as dressing, grooming, and using the bathroom. There are actions you can take to help a person manage his or her life while living with this condition. Communicating When the person is talking or seems frustrated, make eye contact and hold the person's hand. Ask specific questions that need yes or no answers. Use simple words, short sentences, and a calm voice. Only give one direction at a time. When offering choices, limit the person to just one or two. Avoid correcting the person in a negative way. If the person is struggling to find the right words, gently try to help him or her. Preventing injury  Keep floors clear of clutter. Remove rugs, magazine racks, and floor lamps. Keep hallways well lit, especially at night. Put a handrail and nonslip mat in the bathtub or shower. Put childproof locks on cabinets that contain dangerous items, such as medicines, alcohol, guns, toxic cleaning items, sharp tools or utensils, matches, and lighters. For doors to the outside of the house, put the locks in places where the person cannot see or reach them easily. This will help ensure that the person does not wander out of the house and get lost. Be prepared for emergencies. Keep a list of emergency phone numbers and addresses in a convenient area. Remove car keys and lock garage doors so that the person does not try to get in the car and drive. Have the person wear a bracelet that tracks locations and identifies the person as having memory problems. This should be worn at all times for safety. Helping with daily life  Keep the person on track with his or her routine. Try to identify areas where the person may need help. Be supportive, patient, calm, and encouraging. Gently remind the person that adjusting to changes takes time. Help with the tasks that the  person has asked for help with. Keep the person involved in daily tasks and decisions as much as possible. Encourage conversation, but try not to get frustrated if the person struggles to find words or does not seem to appreciate your help. How to recognize stress Look for signs of stress in yourself and in the person you are caring for. If you notice signs of stress, take steps to manage it. Symptoms of stress include: Feeling anxious, irritable, frustrated, or angry. Denying that the person has dementia or that his or her symptoms will not improve. Feeling depressed, hopeless, or unappreciated. Difficulty sleeping. Difficulty concentrating. Developing stress-related health problems. Feeling like you have too little time for your own life. Follow these instructions at home: Take care of your health Make sure that you and the person you are caring for: Get regular sleep. Exercise regularly. Eat regular, nutritious meals. Take over-the-counter and prescription medicines only as told by  your health care providers. Drink enough fluid to keep your urine pale yellow. Attend all scheduled health care appointments.  General instructions Join a support group with others who are caregivers. Ask about respite care resources. Respite care can provide short-term care for the person so that you can have a regular break from the stress of caregiving. Consider any safety risks and take steps to avoid them. Organize medicines in a pill box for each day of the week. Create a plan to handle any legal or financial matters. Get legal or financial advice if needed. Keep a calendar in a central location to remind the person of appointments or other activities. Where to find support: Many individuals and organizations offer support. These include: Support groups for people with dementia. Support groups for caregivers. Counselors or therapists. Home health care services. Adult day care centers. Where to  find more information Centers for Disease Control and Prevention: http://www.wolf.info/ Alzheimer's Association: CapitalMile.co.nz Family Caregiver Alliance: www.caregiver.[place]: www.alzfdn.org Contact a health care provider if: The person's health is rapidly getting worse. You are no longer able to care for the person. Caring for the person is affecting your physical and emotional health. You are feeling depressed or anxious about caring for the person. Get help right away if: The person threatens himself or herself, you, or anyone else. You feel depressed or sad, or feel that you want to harm yourself. If you ever feel like your loved one may hurt himself or herself or others, or if he or she shares thoughts about taking his or her own life, get help right away. You can go to your nearest emergency department or: Call your local emergency services (911 in the [place] Call a suicide crisis helpline, such as the [facility] at 9196624242 or 988 in the [place]. This is open 24 hours a day in the [place] Text the Crisis Text Line at 401-880-7089 (in the [place].). Summary Dementia is a term used to describe a number of symptoms that affect memory and thinking. Dementia usually gets worse slowly over time. Take steps to reduce the person's risk of injury and to plan for future care. Caregivers need support, relief from caregiving, and time for their own lives.  +++++++++++++++++++++++++++++++  Vit D  & Vit C 1,000 mg   are recommended to help protect  against the Covid-19 and other Corona viruses.    Also it's recommended  to take  Zinc 50 mg  to help  protect against the Covid-19   and best place to get  is also on [employer].com  and don't pay more than 6-8 cents /pill !  ================================ ++++++++++++++++++++++++++++++++++++++++ Recommend Adult Low Dose Aspirin or  coated  Aspirin 81 mg daily  To reduce risk of Colon Cancer 40 %,  Skin  Cancer 26 % ,  Malignant Melanoma 46%  and  Pancreatic cancer 60% ++++++++++++++++++++++++++++++++++++++++ Vitamin D goal  is between 70-100.  Please make sure that you are taking your Vitamin D as directed.  It is very important as a natural anti-inflammatory  helping hair, skin, and nails, as well as reducing stroke and heart attack risk.  It helps your bones and helps with mood. It also decreases numerous cancer risks so please take it as directed.  Low Vit D is associated with a 200-300% higher risk for CANCER  and 200-300% higher risk for HEART   ATTACK  &  STROKE.   .....................................Marland Kitchen It is also associated with higher death rate  at younger ages,  autoimmune diseases like Rheumatoid arthritis, Lupus, Multiple Sclerosis.    Also many other serious conditions, like depression, Alzheimer's Dementia, infertility, muscle aches, fatigue, fibromyalgia - just to name a few. ++++++++++++++++++++++ Recommend the book "The END of DIETING" by Dr Excell Seltzer  & the book "The END of DIABETES " by Dr Excell Seltzer At [email or web address] - get book & Audio CD's     Being diabetic has a  300% increased risk for heart attack, stroke, cancer, and alzheimer- type vascular dementia. It is very important that you work harder with diet by avoiding all foods that are white. Avoid white rice (brown & wild rice is OK), white potatoes (sweetpotatoes in moderation is OK), White bread or wheat bread or anything made out of white flour like bagels, donuts, rolls, buns, biscuits, cakes, pastries, cookies, pizza crust, and pasta (made from white flour & egg whites) - vegetarian pasta or spinach or wheat pasta is OK. Multigrain breads like Arnold's or Pepperidge Farm, or multigrain sandwich thins or flatbreads.  Diet, exercise and weight loss can reverse and cure diabetes in the early stages.  Diet, exercise and weight loss is very important in the control and prevention of complications of diabetes which  affects every system in your body, ie. Brain - dementia/stroke, eyes - glaucoma/blindness, heart - heart attack/heart failure, kidneys - dialysis, stomach - gastric paralysis, intestines - malabsorption, nerves - severe painful neuritis, circulation - gangrene & loss of a leg(s), and finally cancer and Alzheimers.    I recommend avoid fried & greasy foods,  sweets/candy, white rice (brown or wild rice or Quinoa is OK), white potatoes (sweet potatoes are OK) - anything made from white flour - bagels, doughnuts, rolls, buns, biscuits,white and wheat breads, pizza crust and traditional pasta made of white flour & egg white(vegetarian pasta or spinach or wheat pasta is OK).  Multi-grain bread is OK - like multi-grain flat bread or sandwich thins. Avoid alcohol in excess. Exercise is also important.    Eat all the vegetables you want - avoid meat, especially red meat and dairy - especially cheese.  Cheese is the most concentrated form of trans-fats which is the worst thing to clog up our arteries. Veggie cheese is OK which can be found in the fresh produce section at [facility] or [employer] or [employer]  ++++++++++++++++++++++

## 2021-08-28 ENCOUNTER — Encounter: Payer: Self-pay | Admitting: Hematology

## 2021-08-28 LAB — COMPLETE METABOLIC PANEL WITH GFR
AG Ratio: 1.8 (calc) (ref 1.0–2.5)
ALT: 15 U/L (ref 9–46)
AST: 12 U/L (ref 10–35)
Albumin: 4.2 g/dL (ref 3.6–5.1)
Alkaline phosphatase (APISO): 67 U/L (ref 35–144)
BUN: 15 mg/dL (ref 7–25)
CO2: 27 mmol/L (ref 20–32)
Calcium: 10 mg/dL (ref 8.6–10.3)
Chloride: 106 mmol/L (ref 98–110)
Creat: 0.9 mg/dL (ref 0.70–1.22)
Globulin: 2.3 g/dL (calc) (ref 1.9–3.7)
Glucose, Bld: 86 mg/dL (ref 65–99)
Potassium: 3.8 mmol/L (ref 3.5–5.3)
Sodium: 142 mmol/L (ref 135–146)
Total Bilirubin: 0.6 mg/dL (ref 0.2–1.2)
Total Protein: 6.5 g/dL (ref 6.1–8.1)
eGFR: 86 mL/min/{1.73_m2} (ref >=60)

## 2021-08-28 LAB — CBC WITH DIFFERENTIAL/PLATELET
Absolute Monocytes: 557 cells/uL (ref 200–950)
Basophils Absolute: 69 cells/uL (ref 0–200)
Basophils Relative: 1.3 %
Eosinophils Absolute: 32 cells/uL (ref 15–500)
Eosinophils Relative: 0.6 %
HCT: 37.1 % — ABNORMAL LOW (ref 38.5–50.0)
Hemoglobin: 12.3 g/dL — ABNORMAL LOW (ref 13.2–17.1)
Lymphs Abs: 1998 cells/uL (ref 850–3900)
MCH: 29.9 pg (ref 27.0–33.0)
MCHC: 33.2 g/dL (ref 32.0–36.0)
MCV: 90.3 fL (ref 80.0–100.0)
MPV: 11.7 fL (ref 7.5–12.5)
Monocytes Relative: 10.5 %
Neutro Abs: 2645 cells/uL (ref 1500–7800)
Neutrophils Relative %: 49.9 %
Platelets: 222 10*3/uL (ref 140–400)
RBC: 4.11 10*6/uL — ABNORMAL LOW (ref 4.20–5.80)
RDW: 13 % (ref 11.0–15.0)
Total Lymphocyte: 37.7 %
WBC: 5.3 10*3/uL (ref 3.8–10.8)

## 2021-08-28 LAB — VITAMIN D 25 HYDROXY (VIT D DEFICIENCY, FRACTURES): Vit D, 25-Hydroxy: 89 ng/mL (ref 30–100)

## 2021-08-28 LAB — TSH: TSH: 1.67 mIU/L (ref 0.40–4.50)

## 2021-08-28 NOTE — Progress Notes (Signed)
============================================================ -   Test results slightly outside the reference range are not unusual. If there is anything important, I will review this with you,  otherwise it is considered normal test values.  If you have further questions,  please do not hesitate to contact me at the office or via My Chart.  ============================================================ ============================================================  -  CBC - OK - Red cell count is better & WBC is Normal  ( No Infection )  ============================================================ ============================================================  - Vitamin D = 89 - Excellent - Please keep dose same ============================================================ ============================================================  - All Else  - Kidneys - Electrolytes - Liver  & Thyroid    - all  Normal / OK ============================================================ ============================================================

## 2021-08-29 ENCOUNTER — Telehealth: Payer: Self-pay

## 2021-08-29 NOTE — Progress Notes (Signed)
08/29/21-Called to connect with patient after his most recent hospital admission. Left voicemail to return call.   09/03/21- Was able to connect with patient to see how he was doing after his hospital admission. His wife said that he went to a nursing home and she recently picked him up and he is doing well. I was able to speak with him and he was in good spirits and doing ok. I have scheduled him for a follow up with [facility] over the phone for 09/10/21 at 3:30pm.   Hildred Alamin, [place] [phone]

## 2021-09-03 ENCOUNTER — Telehealth: Payer: Self-pay | Admitting: Internal Medicine

## 2021-09-03 ENCOUNTER — Other Ambulatory Visit: Payer: Self-pay | Admitting: Internal Medicine

## 2021-09-03 DIAGNOSIS — R1310 Dysphagia, unspecified: Secondary | ICD-10-CM

## 2021-09-03 DIAGNOSIS — R1319 Other dysphagia: Secondary | ICD-10-CM

## 2021-09-03 NOTE — Telephone Encounter (Signed)
Message was sent to Dr. Unk Pinto.  [place] called to request an order be faxed to [facility] for Modifed Barium Swallow Test for patient w/ dysphagia. Dr. Melford Aase ordered [id number], notified [facility] of order. [facility] department notified by phone and advised they will call and schedule patient.

## 2021-09-05 ENCOUNTER — Ambulatory Visit (HOSPITAL_COMMUNITY): Payer: Medicare HMO

## 2021-09-05 ENCOUNTER — Encounter (HOSPITAL_COMMUNITY): Payer: Medicare HMO

## 2021-09-05 ENCOUNTER — Other Ambulatory Visit: Payer: Self-pay | Admitting: Internal Medicine

## 2021-09-10 ENCOUNTER — Telehealth: Payer: Medicare HMO | Admitting: Pharmacist

## 2021-09-10 ENCOUNTER — Other Ambulatory Visit: Payer: Self-pay | Admitting: Nurse Practitioner

## 2021-09-10 DIAGNOSIS — F419 Anxiety disorder, unspecified: Secondary | ICD-10-CM

## 2021-09-11 ENCOUNTER — Other Ambulatory Visit: Payer: Self-pay | Admitting: Internal Medicine

## 2021-09-11 ENCOUNTER — Other Ambulatory Visit: Payer: Self-pay

## 2021-09-11 ENCOUNTER — Ambulatory Visit: Payer: Medicare HMO | Admitting: Pharmacist

## 2021-09-11 DIAGNOSIS — K8689 Other specified diseases of pancreas: Secondary | ICD-10-CM

## 2021-09-11 DIAGNOSIS — I10 Essential (primary) hypertension: Secondary | ICD-10-CM

## 2021-09-11 DIAGNOSIS — R531 Weakness: Secondary | ICD-10-CM

## 2021-09-11 DIAGNOSIS — K219 Gastro-esophageal reflux disease without esophagitis: Secondary | ICD-10-CM

## 2021-09-11 DIAGNOSIS — F419 Anxiety disorder, unspecified: Secondary | ICD-10-CM

## 2021-09-11 DIAGNOSIS — G459 Transient cerebral ischemic attack, unspecified: Secondary | ICD-10-CM

## 2021-09-11 DIAGNOSIS — N138 Other obstructive and reflux uropathy: Secondary | ICD-10-CM

## 2021-09-11 MED ORDER — ROSUVASTATIN CALCIUM 20 MG PO TABS
ORAL_TABLET | ORAL | 3 refills | Status: DC
Start: 2021-09-11 — End: 2022-07-14

## 2021-09-11 NOTE — Progress Notes (Signed)
Follow Up Patient Visit  Javier [name], Javier [name] [id number] 32 years, Male  DOB: 03-Aug-1941  M: [phone] Care Team:   Lujean Amel    __________________________________________________ Summary: Javier [name] is a friendly 80 year old male who presents for CCM follow-up visit. I completed the phone call with his wife. Pt is still generally weak but regains strength as the day progresses  Recommendations/Changes made from today's visit: Pt would like to receive PCV-23 or PCV-20 at next visit with provider Clarified Rosuvastatin dose with patient   Patient Chart Prep (HC)  Chronic Conditions Patient's Chronic Conditions: Benign Prostatic Hyperplasia (BPH), Hypertension (HTN), Hyperlipidemia/Dyslipidemia (HLD), Anxiety, Gastroesophageal Reflux Disease (GERD), Vascular dementia, Vitamin D deficiency, Chronic lymphotic leukemia,   Doctor and Hospital Visits Were there PCP Visits since last visit with the Pharmacist?: Yes Visit #1: 05/21/2021- Marta Lamas, NP-Patient presented for follow up. BP 148/78, HR 85. Stop Quetiapine 75mg , daily at bedtime. Start Sertraline 50mg , daily. Start Buspirone 5mg , three times daily.  Were there Specialist Visits since last visit with the Pharmacist?: No Was there a Hospital Visit in last 30 days?: Yes Reason for admission: Weakness Admit Date: 08/09/2021 Discharge Date: 08/16/2021 Location Discharged from: [facility] Additional Information: Patient is still admitted as of today 08/16/21.  Were there other Hospital Visits since last visit with the Pharmacist?: No  Medication Information Have there been any medication changes from PCP or Specialist since last visit with the Pharmacist?: No Are there any Medication adherence gaps (beyond 5 days past due)?: No Medication adherence rates for the STAR rating drugs: N/A **CHART PREP NOT COMPLETED** List Patient's current Care Gaps: No current Care Gaps  identified  Pre-Call Questions (HC) Are you able to connect with Patient?: No Were you able to leave a message?: No  Disease Assessments  Subjective Information Current BP: 138/70 Current HR: 88 taken on: 08/27/2021 Weight: 153 BMI: 21.34 Last GFR: 86 taken on: 08/27/2021 Why did the patient present?: CCM follow-up Factors that may affect medication adherence?: Pill burden, Disability, Altered mental status (dementia, substance use, mental illness) Is Patient using [facility] pharmacy?: No Name and location of Current pharmacy: Mail Order Current Rx insurance plan: [employer] Are meds synced by current pharmacy?: Yes Are meds delivered by current pharmacy?: Yes - by mail order pharmacy Would patient benefit from direct intervention of clinical lead in dispensing process to optimize clinical outcomes?: Yes Are [facility] pharmacy services available where patient lives?: Yes Is patient disadvantaged to use [facility]?: Yes Does patient experience delays in picking up medications due to transportation concerns (getting to pharmacy)?: No  Hypertension (HTN) Assess this condition today?: Yes Is patient able to obtain BP reading today?: Yes BP today is: 120/70 Heart Rate is: 84 Goal: <140/90 mmHG Hypertension Stage: Stage 1 (SBP: 130-139 or DBP: 80-89) Is Patient checking BP at home?: Yes Patient home BP readings are ranging: 120s/70 How often does patient miss taking their blood pressure medications?: No Has patient experienced hypotension, dizziness, falls or bradycardia?: No Does Patient use RPM device?: Yes BP RPM device: Does patient qualify?: Yes, but barriers exist for patient Document barriers: Pt has nurses who check his BP We discussed: Reducing the amount of salt intake to 1500mg /per day. Assessment:: Controlled Drug: Losartan 100mg  QD Assessment: Appropriate, Effective, Safe, Accessible HC Follow up: N/A Pharmacist Follow up: Continue to monitor BP, monitor SCr,  K+, etc  Hyperlipidemia/Dyslipidemia (HLD) Last Lipid panel on: 08/10/2021 TC (Goal<200): 174 LDL: 103 HDL (  Goal>40): 55 TG (Goal<150): 8 ASCVD 10-year risk?is:: N/A due to Age > 79 Assess this condition today?: Yes LDL Goal: <70 Additional Risk Factors: TIA, aortic atherosclerosis Has patient tried and failed any HLD Medications?: No We discussed: How to reduce cholesterol through diet/weight management and physical activity., How a diet high in plant sterols (fruits/vegetables/nuts/whole grains/legumes) may reduce your cholesterol., Encouraged increasing fiber to a daily intake of 10-25g/day Assessment:: Uncontrolled Drug: Rosuvastatin 10mg  QD Assessment: Appropriate, Query Effectiveness Drug: Aspirin 81mg  QD Assessment: Appropriate, Effective, Safe, Accessible Additional Info: Emphasized importance of adherence to rosuvastatin Plan to (other): Will reach out to provider to clarify rosuvastatin dose, Pt still taking 10mg  QD, but may benefit from increased dose to 20mg  QD HC Follow up: N/A Pharmacist Follow up: Will reach out to provider about Rosuvastatin dose  Anxiety Assess this condition today?: Yes Completing the GAD-7 Questionnaire today?: No In your opinion, how do you feel your anxiety symptoms have been controlled over the past 3 months?: Improved Assessment:: Controlled Drug: Buspirone 5mg  TID Assessment: Appropriate, Effective, Safe, Accessible Drug: Sertraline 50mg  QD Assessment: Appropriate, Effective, Safe, Accessible HC Follow up: N/A Pharmacist Follow up: Continue to assess mood, symptoms of serotonin syndrome  GERD Assessment/Pancreatic Insufficiency Assess this condition today?: Yes How frequently do you have symptoms of GERD or reflux?: occasionally When does patient experience reflux symptoms?: After meals Does patient experience difficulty or pain with swallowing?: Yes Details: Pt to follow up with barium swallow procedure to assess dysphagia Does  patient experience any of the following?: Bloating or gassy feeling How long have you taken prescription medications for GERD?: Not currently taking anything for GERD What potential triggering foods have you identified and tried to limit intake in your diet?: onion, garlic, peppermint Have these changes been successful in reducing reflux symptoms?: Yes We discussed: Remaining upright for 2-3 hours after eating and avoiding eating for 2-3 hours before bedtime, Elevating head of the bed 6-9 inches, Wearing loose fitting clothes Assessment:: Controlled Drug: Creon 12,000-38,000 Units TID with meals Assessment: Appropriate, Effective, Safe, Accessible HC Follow up: N/A Pharmacist Follow up: Continue to monitor GERD symtpoms  BPH Assessment Assess this condition today?: Yes Current PSA: 2.87 taken on: 01/08/2021 Are you experiencing any side effects from your BPH medication?: None Completing BPH AUA Questionnaire today?: No How would you feel if you had to live with your urinary condition the way it is now, no better, no worse, for the rest of your life?: Mostly Satisfied Assessment:: Controlled Drug: Tamsulosin 0.4mg  QD Assessment: Appropriate, Effective, Safe, Accessible Additional Info: Pt has no complaint except waking up 2-3 times per night to urinate HC Follow up: N/A Pharmacist Follow up: Continue to monitor urinary smptoms  Vitamin D Deficiency Pertinent Labs: Vit D (08/20/21) - 89 Assessment:: Controlled Drug: Vitamin D3 5000 units daily Assessment: Appropriate, Effective, Safe, Accessible HC Follow up: N/A Pharmacist Follow up: Continue to monitor Vit D levels  Exercise, Diet and Non-Drug Coordination Needs Additional exercise counseling points. We discussed: Other Details: Discussed trying to have patient move around the house as tolerated Additional diet counseling points. We discussed: key components of the DASH diet Discussed Non-Drug Care Coordination Needs: Yes Does  Patient have Medication financial barriers?: No  Accountable Health Communities Health-Related Social Needs Screening Tool -  SDOH  ([email or web address]) What is your living situation today? (ref #1): I have a steady place to live Think about the place you live. Do you have problems with any of the following? (ref #2): None of  the above Within the past 12 months, you worried that your food would run out before you got money to buy more (ref #3): Never true Within the past 12 months, the food you bought just didn't last and you didn't have money to get more (ref #4): Never true In the past 12 months, has lack of reliable transportation kept you from medical appointments, meetings, work or from getting things needed for daily living? (ref #5): No In the past 12 months, has the electric, gas, oil, or water company threatened to shut off services in your home? (ref #6): No How often does anyone, including family and friends, physically hurt you? (ref #7): Never (1) How often does anyone, including family and friends, insult or talk down to you? (ref #8): Never (1) How often does anyone, including friends and family, threaten you with harm? (ref #9): Never (1) How often does anyone, including family and friends, scream or curse at you? (ref #10): Never (1)  Engagement Notes Javier [name] on 09/11/2021 02:02 PM CPP Chart Review: 24 min  CPP Office Visit: 14 min  CPP Office Visit Documentation: 33 min CPP Coordination of Care:  [facility] Care Plan Completion:  CPP Care Plan Review:  Engagement Notes Javier [name] on 09/11/2021 02:01 PM HC F/u: N/A  CPP F/u:  Will verify statin dose with provider 12/17/20 @1 :30pm CCM phone call   Care Gaps:  Flu shot: Pt recieved at nursing home, but not sure what date between 11/22 - 11/24 Shingles: Pt will obtain next year at [place]: Pt would like to recieve at next Office visit [place] booster: Pt  will obtain at [place]. Javier [name], Javier [name]  Clinical Pharmacist  [email or web address]  [phone]

## 2021-09-13 ENCOUNTER — Ambulatory Visit (HOSPITAL_COMMUNITY)
Admission: RE | Admit: 2021-09-13 | Discharge: 2021-09-13 | Disposition: A | Discharge status: Home / Self Care | Payer: Medicare HMO | Source: Ambulatory Visit | Attending: Internal Medicine | Admitting: Internal Medicine

## 2021-09-13 ENCOUNTER — Ambulatory Visit (HOSPITAL_COMMUNITY): Payer: Medicare HMO

## 2021-09-13 ENCOUNTER — Other Ambulatory Visit: Payer: Self-pay

## 2021-09-13 ENCOUNTER — Encounter (HOSPITAL_COMMUNITY): Payer: Medicare HMO

## 2021-09-13 DIAGNOSIS — R1319 Other dysphagia: Secondary | ICD-10-CM | POA: Insufficient documentation

## 2021-09-13 DIAGNOSIS — R1310 Dysphagia, unspecified: Secondary | ICD-10-CM | POA: Insufficient documentation

## 2021-09-16 ENCOUNTER — Other Ambulatory Visit: Payer: Self-pay | Admitting: Internal Medicine

## 2021-09-16 DIAGNOSIS — I10 Essential (primary) hypertension: Secondary | ICD-10-CM

## 2021-09-17 DIAGNOSIS — M75111 Incomplete rotator cuff tear or rupture of right shoulder, not specified as traumatic: Secondary | ICD-10-CM | POA: Diagnosis not present

## 2021-09-18 ENCOUNTER — Other Ambulatory Visit: Payer: Self-pay

## 2021-09-18 ENCOUNTER — Encounter: Payer: Self-pay | Admitting: Internal Medicine

## 2021-09-18 MED ORDER — LOSARTAN POTASSIUM 100 MG PO TABS: 100.0000 mg | ORAL_TABLET | Freq: Every day | ORAL | 1 refills | Status: DC

## 2021-09-26 DIAGNOSIS — E785 Hyperlipidemia, unspecified: Secondary | ICD-10-CM | POA: Diagnosis not present

## 2021-09-26 DIAGNOSIS — K219 Gastro-esophageal reflux disease without esophagitis: Secondary | ICD-10-CM | POA: Diagnosis not present

## 2021-09-26 DIAGNOSIS — I10 Essential (primary) hypertension: Secondary | ICD-10-CM | POA: Diagnosis not present

## 2021-10-02 ENCOUNTER — Other Ambulatory Visit: Payer: Self-pay | Admitting: Internal Medicine

## 2021-10-02 DIAGNOSIS — R451 Restlessness and agitation: Secondary | ICD-10-CM

## 2021-10-02 DIAGNOSIS — S46011D Strain of muscle(s) and tendon(s) of the rotator cuff of right shoulder, subsequent encounter: Secondary | ICD-10-CM | POA: Diagnosis not present

## 2021-10-07 DIAGNOSIS — S46011D Strain of muscle(s) and tendon(s) of the rotator cuff of right shoulder, subsequent encounter: Secondary | ICD-10-CM | POA: Diagnosis not present

## 2021-10-15 DIAGNOSIS — S46011D Strain of muscle(s) and tendon(s) of the rotator cuff of right shoulder, subsequent encounter: Secondary | ICD-10-CM | POA: Diagnosis not present

## 2021-10-17 DIAGNOSIS — M25511 Pain in right shoulder: Secondary | ICD-10-CM | POA: Diagnosis not present

## 2021-10-23 DIAGNOSIS — S46011D Strain of muscle(s) and tendon(s) of the rotator cuff of right shoulder, subsequent encounter: Secondary | ICD-10-CM | POA: Diagnosis not present

## 2021-10-24 ENCOUNTER — Other Ambulatory Visit: Payer: Self-pay | Admitting: Nurse Practitioner

## 2021-10-24 DIAGNOSIS — F419 Anxiety disorder, unspecified: Secondary | ICD-10-CM

## 2021-10-29 ENCOUNTER — Encounter: Payer: Self-pay | Admitting: Neurology

## 2021-10-29 ENCOUNTER — Ambulatory Visit: Payer: Medicare HMO | Admitting: Neurology

## 2021-10-29 ENCOUNTER — Other Ambulatory Visit: Payer: Self-pay | Admitting: Internal Medicine

## 2021-10-29 DIAGNOSIS — R413 Other amnesia: Secondary | ICD-10-CM

## 2021-10-29 DIAGNOSIS — R269 Unspecified abnormalities of gait and mobility: Secondary | ICD-10-CM

## 2021-10-29 DIAGNOSIS — R451 Restlessness and agitation: Secondary | ICD-10-CM | POA: Diagnosis not present

## 2021-10-29 NOTE — Progress Notes (Signed)
Chief Complaint  Patient presents with   Follow-up    Rm 107 , with wife, states memory is stable, c/o body weakness       ASSESSMENT AND PLAN------  Javier [name] is a 81 y.o. male  Dementia with agitation  MoCA examination 17/30  Early morning /night time agitation, excessive sweat during night overall has much improved after adjustment of his medications, he was on frequent Xanax, no longer using it, but wife is confused about the medications,  Per his wife report, he is no longer taking Seroquel, only taking BuSpar 5 mg twice a day, Zoloft 50 mg daily,  Elderly patient with dementia, try to avoid polypharmacy, his reported excessive morning sleepiness, generalized weakness, lack of stamina could due to medication side effect,  Continue moderate exercise, advised him to bring all his medication at next follow-up visit with his primary care physician,  Only return to clinic as needed   DIAGNOSTIC DATA (LABS, IMAGING, TESTING) - I reviewed patient records, labs, notes, testing and imaging myself where available. MRI of the brain on December 27 2020 1.   No acute findings. 2.   T2/flair hyperintense foci in the cerebral hemispheres consistent with chronic microvascular ischemic change and small foci in the cerebellar hemispheres consistent with small lacunar infarctions.  None of the ischemic changes appears to be acute. CT abdomen on Feb 22, 2021: No abnormality seen to explain the presenting symptoms. The patient does have mild diverticulosis of the left colon but there is no CT evidence of acute diverticulitis.  Laboratory evaluation in 2022,  CMP, creatinine 1.14, calcium mildly elevated 10.5, glucose 109, CBC, Hg 13.9, LDH 156.   HISTORICAL  Javier [name], is a 81 year old male seen in request by her primary care physician Dr. Melford Aase, Gwyndolyn Saxon for evaluation of memory loss, initial evaluation was on December 20, 2020.  He is accompanied by his wife at today's clinical  visit.  I reviewed and summarized the referring note.PMHX. HTN Anxiety, zoloft since 2020, seroquel 50mg  qhs, xanax 0.5mg    He is a retired Furniture conservator/restorer, denied family history of memory loss, he was diagnosed with chronic lymphocytic leukemia, CAT scan 2018 showed small hypermetabolic node in left thorax, received chemotherapy, finished treatment since 2021, was under the care of oncologist Dr. Irene Limbo, most recent visit was on June 22, 2020 he was treated with venetoclax plus obinutuzumab from May to Sept 2020.  Since he finished chemotherapy, around the beginning of 2021, was noted to have early morning excessive sweat, anxiety, felt burning hot, he often has to get up from bed, go outside,  with no shoes on, after few minutes, then he felt cold, has to come in, but could not stay in for too long because of burning hot, sweaty, he has to go outside again, this usually happens 3-4 AM, he will often get in and out of house multiple times, and gradually settle down around 9 AM,  He was given Xanax, which is somewhat helpful, but only takes occasion, if he took excessive amount of Xanax, become very sleepy  He can fall to sleep okay after taking Seroquel 50 mg every night, wife also reported that he become very sedentary since treatment, he cannot get his mind off something if he begin to focus on that  UPDATE March 28 2021: He is accompanied by his wife at today's visit, reported increased anxiety, weakness, more difficulty sleeping, he gets up in the middle of the sleep every night after few hours  of sleep, complains of feeling hot, has to go out to cool down, only when he felt so cold, he came back from outside, then had to go out again with the same complaint,  He has stopped Zoloft, and Seroquel, "it is too much anxiety medications".  Instead he relies on Xanax 0.5 mg up to 4 tablets each day, prescription was from his primary care physician, wife stated that "he is hooked up to this medications,  lives on it".   We personally reviewed MRI wo on December 28 2020,  No acute findings. microvascular ischemic change and small foci in the cerebellar hemispheres consistent with small lacunar infarctions.  None of the ischemic changes appears to be acute.  Laboratory evaluations, CMP showed elevated creatinine 1.14, glucose of 109, calcium mildly elevated 10.5, CBC, hemoglobin of 13.9  UPDATE Oct 29 2021: Is accompanied by his wife at today's visit, there were some medication changes recently, overall he is much better, less agitated, no longer has nighttime sweaty spells, he can sleep through the night with medications, but wife is very confused about the medications, and reported patient tends to sleepy during the day, especially early morning time, then some improvement in the afternoon  MoCA examination 17/30 today, Seroquel 50 mg 3 times a day was given by his primary care physician Dr. Unk Pinto on October 02, 2021, wife stated he is not taking it, Buspirone  instead of taking 5 mg 3 times a day, he only takes twice a day, Zoloft 50 mg every morning, wife also reported patient have generalized weakness, loss of stamina,  Laboratory evaluation November 2022, normal vitamin D, TSH, CMP, negative acetylcholine receptor antibody, hemoglobin of 12.3, A1c of 5.3  REVIEW OF SYSTEMS: Full 14 system review of systems performed and notable only for as above All other review of systems were negative.  PHYSICAL EXAM   There were no vitals filed for this visit.  Not recorded     There is no height or weight on file to calculate BMI.  PHYSICAL EXAMNIATION:  Gen: NAD, conversant, well nourised, well groomed       NEUROLOGICAL EXAM:  MENTAL STATUS: Cooperative on examination, rely on his wife to provide history, Montreal Cognitive Assessment  10/29/2021  Visuospatial/ Executive (0/5) 1  Naming (0/3) 3  Attention: Read list of digits (0/2) 2  Attention: Read list of letters (0/1) 1   Attention: Serial 7 subtraction starting at 100 (0/3) 3  Language: Repeat phrase (0/2) 1  Language : Fluency (0/1) 0  Abstraction (0/2) 1  Delayed Recall (0/5) 1  Orientation (0/6) 4  Total 17      CRANIAL NERVES: CN II: Visual fields are full to confrontation. Pupils are round equal and briskly reactive to light. CN III, IV, VI: extraocular movement are normal. No ptosis. CN V: Facial sensation is intact to light touch CN VII: Face is symmetric with normal eye closure  CN VIII: Hearing is normal to causal conversation. CN IX, X: Phonation is normal. CN XI: Head turning and shoulder shrug are intact  MOTOR: There is no pronator drift of out-stretched arms. Muscle bulk and tone are normal. Muscle strength is normal.  REFLEXES: Reflexes are 1 and symmetric at the biceps, triceps, knees, and ankles. Plantar responses are flexor.  SENSORY: Intact to light touch, pinprick and vibratory sensation are intact in fingers and toes.  COORDINATION: There is no trunk or limb dysmetria noted.  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     busPIRone (BUSPAR) 5 MG tablet TAKE 1 TABLET BY MOUTH THREE TIMES DAILY 90 tablet 0   Cholecalciferol (VITAMIN D3) 5000 units TABS Take 5,000 Units by mouth daily.     lipase/protease/amylase (CREON) 12000-38000 units CPEP capsule Take 12,000 Units by mouth 3 (three) times daily with meals.     losartan (COZAAR) 100 MG tablet Take 1 tablet (100 mg total) by mouth daily. 90 tablet 1   QUEtiapine (SEROQUEL) 50 MG tablet Take 1  tablet 3 x /day at Lunch, Suppertime & Bedtime for Nerves or  Agitation 90 tablet 0   rosuvastatin (CRESTOR) 20 MG tablet Take  1 tablet  Daily  for Cholesterol 90 tablet 3   sertraline (ZOLOFT) 50 MG tablet Take  1 tablet  Daily  for Mood  / Chronic Anxiety 90 tablet 3   tamsulosin (FLOMAX) 0.4 MG CAPS capsule Take 1 capsule (0.4 mg total) by mouth daily after supper. For prostate. 90 capsule 1   No current facility-administered medications for this visit.    PAST MEDICAL HISTORY: Past Medical History:  Diagnosis Date   Diverticulosis    Hyperlipidemia    Hypertension    Memory loss    Prediabetes    Vitamin D deficiency     PAST SURGICAL HISTORY: Past Surgical History:  Procedure Laterality Date   NO PAST SURGERIES      FAMILY HISTORY: Family History  Problem Relation Age of Onset   Stroke Mother    Early death Father        Farm/tractor accident   Diabetes Brother    Cirrhosis Brother    Colon cancer Paternal Grandfather     SOCIAL HISTORY: Social History   Socioeconomic History   Marital status: Married    Spouse name: Not on file   Number of children: 5   Years of education: 9th grade   Highest education level: Not on file  Occupational History   Occupation: retired  Tobacco Use   Smoking status: Former    Types: Cigarettes    Quit date: 01/17/2003    Years since quitting: 18.7   Smokeless tobacco: Never  Vaping Use   Vaping Use: Never used  Substance and Sexual Activity   Alcohol use: Not Currently    Comment: very rare   Drug use: No   Sexual activity: Not on file  Other Topics Concern   Not on file  Social History Narrative   Lives at home with his wife.   Right-handed.   At most, one cup caffeine per day.    Social Determinants of Health   Financial Resource Strain: Not on file  Food Insecurity: No Food Insecurity   Worried About Charity fundraiser in the Last Year: Never true   Ran Out of Food in the Last Year: Never true   Transportation Needs: No Transportation Needs   Lack of Transportation (Medical): No   Lack of Transportation (Non-Medical): No  Physical Activity: Not on file  Stress: Not on file  Social Connections: Not on file  Intimate Partner Violence:  Not on file      Marcial Pacas, M.D. Ph.D.  [facility] [address] [place], [place] [address] Ph: [phone] Fax: [phone]  CC:  Unk Pinto, MD [address] [place],  [place] [address]  Unk Pinto, MD

## 2021-11-01 DIAGNOSIS — S46011D Strain of muscle(s) and tendon(s) of the rotator cuff of right shoulder, subsequent encounter: Secondary | ICD-10-CM | POA: Diagnosis not present

## 2021-12-04 DIAGNOSIS — S46011D Strain of muscle(s) and tendon(s) of the rotator cuff of right shoulder, subsequent encounter: Secondary | ICD-10-CM | POA: Diagnosis not present

## 2021-12-04 DIAGNOSIS — R69 Illness, unspecified: Secondary | ICD-10-CM | POA: Diagnosis not present

## 2021-12-13 ENCOUNTER — Telehealth: Payer: Self-pay

## 2021-12-13 NOTE — Telephone Encounter (Signed)
LM-12/13/21-Called patient and confirmed phone visit on 3/21 at 1:30PM w/ CPP with the patient and his wife "Javier [name]". Went over precall questions with pt as well. ? ?Total time spent:9 Minutes ?Vanetta Shawl, [facility] ?

## 2021-12-16 NOTE — Progress Notes (Signed)
?FOLLOW UP ? ?Assessment and Plan:  ?Javier [name] was seen today for follow-up. ? ?Diagnoses and all orders for this visit: ? ?Essential hypertension ? - continue medications, DASH diet, exercise and monitor at home. Call if greater than 130/80.   ?Reminder to go to the ER if any CP, SOB, nausea, dizziness, severe HA, changes vision/speech, left arm numbness and tingling and jaw pain. ? ?Gastric AVM ?Follows up GI ?Monitor CBC and Iron ? ?Mixed hyperlipidemia ?Continue medication, diet and exercise ?-     COMPLETE METABOLIC PANEL WITH GFR ?-     Lipid panel ? ?Vascular dementia with behavior disturbance ([place]) ? Continue current medications and home health , monitor symptoms. ? ?Gastroesophageal reflux disease, unspecified whether esophagitis present/Diverticulosis ? Referred to Dr. Earlean Shawl for further evaluation ?Not taking meds at this time ?Discussed diet, avoiding triggers and other lifestyle changes ? ?CLL (chronic lymphocytic leukemia) (HCC) ?-     CBC with Differential/Platelet ? managed by Dr. Irene Limbo ?Venclexa x 1 year completed ?Monitor  ? ?Abnormal glucose ?Continue diet and exercise ?-     Hemoglobin A1c ? ?Anxiety ?Wife state anxiety has improved- not getting up as much at night ?Seroquel has been causing some hangover the next day that lasts until night. Will decrease Seroquel to 25 mg QHS ?Continue Buspar once a day ? ?BPH with LUTS ?Stop Flomax as has not improved symptoms and they are wishing to decrease number of medications ? ?Seasonal Allergic Rhinitis ?Start Flonase 2 sprays each nostril once a day ?Start generic Zyrtec or Allegra daily ?Avoid Sinex nasal spray due to rebound congestion ? ? ?Continue diet and meds as discussed. Further disposition pending results of labs. Discussed med's effects and SE's.   ?Over 30 minutes of exam, counseling, chart review, and critical decision making was performed.  ? ?Future Appointments  ?Date Time Provider [place]  ?01/22/2022  2:00 PM Unk Pinto, MD  [facility] None  ? ? ?---------------------------------------------------------------------------------------------------------------------- ? ?HPI ?81 y.o. male  presents for 3 month follow up on hypertension, cholesterol, diabetes, weight and vitamin D deficiency.  ? ?He is having a lot of throat clearing and coughs up clear mucus. He has been using Sinex frequently. Believes can possibly due to allergies.  ? ?BMI is Body mass index is 22.37 kg/m?., he has not been working on diet and exercise. His eating has dramatically decreased, eating very little.  Drinks a lot of water. Lots of burping and very little food intake.  Denies nausea/vomiting.  ?Wt Readings from Last 3 Encounters:  ?12/18/21 160 lb 6.4 oz (72.8 kg)  ?08/27/21 153 lb (69.4 kg)  ?05/21/21 163 lb (73.9 kg)  ? ?His wife states he is getting up at night a lot less.  She states he is very lethargic when he takes Seroquel at night . He continues to use Buspar with good success, taking only once a day.  ? ?He is still getting up three times a night to urinate.  There was no change with use of Tamsulosin. Would like to D/C ? ?His blood pressure has been controlled at home  running in 120-130/70-80, today their BP is BP: (!) 146/64. He will occasionally get a mild headache.Denies dizziness, visual changes and chest pain ?BP Readings from Last 3 Encounters:  ?12/18/21 (!) 146/64  ?08/27/21 138/70  ?08/20/21 (!) 154/73  ?  ?He does not workout. He denies chest pain, shortness of breath, dizziness. ? ? He is on cholesterol medication Rosuvastatin and denies myalgias. His cholesterol is not  at goal. The cholesterol last visit was:   ?Lab Results  ?Component Value Date  ? CHOL 174 08/10/2021  ? HDL 55 08/10/2021  ? LDLCALC 103 (H) 08/10/2021  ? TRIG 79 08/10/2021  ? CHOLHDL 3.2 08/10/2021  ? ? He has not been working on diet and exercise for prediabetes. Last A1C in the office was:  ?Lab Results  ?Component Value Date  ? HGBA1C 5.6 08/10/2021  ? ?   ? ?Current  Medications:  ?Current Outpatient Medications on File Prior to Visit  ?Medication Sig  ? aspirin EC 81 MG tablet Take 81 mg by mouth daily.  ? busPIRone (BUSPAR) 5 MG tablet TAKE 1 TABLET BY MOUTH THREE TIMES DAILY  ? Cholecalciferol (VITAMIN D3) 5000 units TABS Take 5,000 Units by mouth daily.  ? Dextromethorphan-guaiFENesin (MUCINEX COUGH & CHEST CONGEST PO) Take by mouth.  ? losartan (COZAAR) 100 MG tablet Take 1 tablet (100 mg total) by mouth daily.  ? QUEtiapine (SEROQUEL) 50 MG tablet Take 50 mg by mouth at bedtime.  ? sertraline (ZOLOFT) 50 MG tablet Take  1 tablet  Daily  for Mood  / Chronic Anxiety  ? tamsulosin (FLOMAX) 0.4 MG CAPS capsule Take 1 capsule (0.4 mg total) by mouth daily after supper. For prostate.  ? lipase/protease/amylase (CREON) 12000-38000 units CPEP capsule Take 12,000 Units by mouth 3 (three) times daily with meals. (Patient not taking: Reported on 12/18/2021)  ? rosuvastatin (CRESTOR) 20 MG tablet Take  1 tablet  Daily  for Cholesterol (Patient not taking: Reported on 12/18/2021)  ? ?No current facility-administered medications on file prior to visit.  ? ? ? ?Allergies:  ?Allergies  ?Allergen Reactions  ? Penicillins Shortness Of Breath, Swelling and Other (See Comments)  ?  Unknown allergic reaction as a teenager  ? Ppd [Tuberculin Purified Protein Derivative]   ?  Positive test in 2004  ?  ? ?Medical History:  ?Past Medical History:  ?Diagnosis Date  ? Diverticulosis   ? Hyperlipidemia   ? Hypertension   ? Memory loss   ? Prediabetes   ? Vitamin D deficiency   ? ?Family history- Reviewed and unchanged ?Social history- Reviewed and unchanged ? ? ?Review of Systems:  ?Review of Systems  ?Constitutional:  Negative for chills and fever.  ?HENT:  Positive for congestion. Negative for sore throat and tinnitus.   ?Eyes:  Negative for blurred vision.  ?Respiratory:  Negative for cough, hemoptysis and shortness of breath.   ?Cardiovascular:  Negative for chest pain, palpitations, orthopnea and  leg swelling.  ?Gastrointestinal:  Negative for abdominal pain, blood in stool, constipation, diarrhea, heartburn, nausea and vomiting.  ?Musculoskeletal:  Negative for back pain, joint pain and myalgias.  ?Skin:  Negative for rash.  ?Neurological:  Negative for dizziness.  ?Psychiatric/Behavioral:  Positive for memory loss. The patient is nervous/anxious and has insomnia.   ? ? ? ?Physical Exam: ?BP (!) 146/64   Pulse 78   Temp 97.7 ?F (36.5 ?C)   Wt 160 lb 6.4 oz (72.8 kg)   SpO2 99%   BMI 22.37 kg/m?  ?Wt Readings from Last 3 Encounters:  ?12/18/21 160 lb 6.4 oz (72.8 kg)  ?08/27/21 153 lb (69.4 kg)  ?05/21/21 163 lb (73.9 kg)  ? ?General Appearance: Thin male, appears lethargic ?Eyes: PERRLA, EOMs, conjunctiva no swelling or erythema ?Sinuses: No Frontal/maxillary tenderness ?ENT/Mouth: Ext aud canals clear, TMs without erythema, bulging. No erythema, swelling, or exudate on post pharynx.  Tonsils not swollen or erythematous. Hearing normal.  Pale nasal mucosa ?Neck: Supple, thyroid normal.  ?Respiratory: Respiratory effort normal, BS equal bilaterally without rales, rhonchi, wheezing or stridor.  ?Cardio: RRR with no MRGs. Brisk peripheral pulses without edema.  ?Abdomen: Soft, + BS.  Non tender, no guarding, rebound, hernias, masses. ?Lymphatics: Non tender without lymphadenopathy.  ?Musculoskeletal: Full ROM, Decreased strength Shuffling gait walks with cane ?Skin: Warm, dry without rashes, lesions, ecchymosis.  ?Neuro: Cranial nerves intact. No cerebellar symptoms.  ?Psych: Awake and oriented to person,  lethargic  ? ? ?Magda Bernheim, NP ?10:55 AM ?[facility]  ?

## 2021-12-17 ENCOUNTER — Telehealth: Payer: Medicare HMO | Admitting: Pharmacist

## 2021-12-18 ENCOUNTER — Encounter: Payer: Self-pay | Admitting: Nurse Practitioner

## 2021-12-18 ENCOUNTER — Other Ambulatory Visit: Payer: Self-pay

## 2021-12-18 ENCOUNTER — Ambulatory Visit (INDEPENDENT_AMBULATORY_CARE_PROVIDER_SITE_OTHER): Payer: Medicare HMO | Admitting: Nurse Practitioner

## 2021-12-18 VITALS — BP 146/64 | HR 78 | Temp 97.7°F | Wt 160.4 lb

## 2021-12-18 DIAGNOSIS — I700 Atherosclerosis of aorta: Secondary | ICD-10-CM

## 2021-12-18 DIAGNOSIS — R7309 Other abnormal glucose: Secondary | ICD-10-CM

## 2021-12-18 DIAGNOSIS — E782 Mixed hyperlipidemia: Secondary | ICD-10-CM

## 2021-12-18 DIAGNOSIS — F419 Anxiety disorder, unspecified: Secondary | ICD-10-CM | POA: Diagnosis not present

## 2021-12-18 DIAGNOSIS — K219 Gastro-esophageal reflux disease without esophagitis: Secondary | ICD-10-CM | POA: Diagnosis not present

## 2021-12-18 DIAGNOSIS — N401 Enlarged prostate with lower urinary tract symptoms: Secondary | ICD-10-CM

## 2021-12-18 DIAGNOSIS — F01B18 Vascular dementia, moderate, with other behavioral disturbance: Secondary | ICD-10-CM

## 2021-12-18 DIAGNOSIS — J301 Allergic rhinitis due to pollen: Secondary | ICD-10-CM | POA: Diagnosis not present

## 2021-12-18 DIAGNOSIS — K31819 Angiodysplasia of stomach and duodenum without bleeding: Secondary | ICD-10-CM | POA: Diagnosis not present

## 2021-12-18 DIAGNOSIS — I10 Essential (primary) hypertension: Secondary | ICD-10-CM | POA: Diagnosis not present

## 2021-12-18 DIAGNOSIS — C9110 Chronic lymphocytic leukemia of B-cell type not having achieved remission: Secondary | ICD-10-CM

## 2021-12-18 DIAGNOSIS — N138 Other obstructive and reflux uropathy: Secondary | ICD-10-CM

## 2021-12-18 MED ORDER — QUETIAPINE FUMARATE 25 MG PO TABS: 25.0000 mg | ORAL_TABLET | Freq: Every day | ORAL | 3 refills | Status: DC

## 2021-12-18 MED ORDER — FLUTICASONE PROPIONATE 50 MCG/ACT NA SUSP: 2.0000 | Freq: Every day | NASAL | 2 refills | Status: DC

## 2021-12-18 NOTE — Patient Instructions (Signed)
Stop Tamsulosin ?Decrease Quetiapine to 25 mg daily- sent to pharmacy ? ?Allergic Rhinitis, Adult ?Start over the counter Zyrtec or Allegra(generic) daily ?Start Flonase 2 sprays each nostril once a day- sent to pharmacy ?Stop Sinex nasal spray ?Allergic rhinitis is an allergic reaction that affects the mucous membrane inside the nose. The mucous membrane is the tissue that produces mucus. ?There are two types of allergic rhinitis: ?Seasonal. This type is also called hay fever and happens only during certain seasons. ?Perennial. This type can happen at any time of the year. ?Allergic rhinitis cannot be spread from person to person. This condition can be mild, moderate, or severe. It can develop at any age and may be outgrown. ?What are the causes? ?This condition is caused by allergens. These are things that can cause an allergic reaction. Allergens may differ for seasonal allergic rhinitis and perennial allergic rhinitis. ?Seasonal allergic rhinitis is triggered by pollen. Pollen can come from grasses, trees, and weeds. ?Perennial allergic rhinitis may be triggered by: ?Dust mites. ?Proteins in a pet's urine, saliva, or dander. Dander is dead skin cells from a pet. ?Smoke, mold, or car fumes. ?What increases the risk? ?You are more likely to develop this condition if you have a family history of allergies or other conditions related to allergies, including: ?Allergic conjunctivitis. This is inflammation of parts of the eyes and eyelids. ?Asthma. This condition affects the lungs and makes it hard to breathe. ?Atopic dermatitis or eczema. This is long term (chronic) inflammation of the skin. ?Food allergies. ?What are the signs or symptoms? ?Symptoms of this condition include: ?Sneezing or coughing. ?A stuffy nose (nasal congestion), itchy nose, or nasal discharge. ?Itchy eyes and tearing of the eyes. ?A feeling of mucus dripping down the back of your throat (postnasal drip). ?Trouble sleeping. ?Tiredness or  fatigue. ?Headache. ?Sore throat. ?How is this diagnosed? ?This condition may be diagnosed with your symptoms, medical history, and physical exam. Your health care provider may check for related conditions, such as: ?Asthma. ?Pink eye. This is eye inflammation caused by infection (conjunctivitis). ?Ear infection. ?Upper respiratory infection. This is an infection in the nose, throat, or upper airways. ?You may also have tests to find out which allergens trigger your symptoms. These may include skin tests or blood tests. ?How is this treated? ?There is no cure for this condition, but treatment can help control symptoms. Treatment may include: ?Taking medicines that block allergy symptoms, such as corticosteroids and antihistamines. Medicine may be given as a shot, nasal spray, or pill. ?Avoiding any allergens. ?Being exposed again and again to tiny amounts of allergens to help you build a defense against allergens (immunotherapy). This is done if other treatments have not helped. It may include: ?Allergy shots. These are injected medicines that have small amounts of allergen in them. ?Sublingual immunotherapy. This involves taking small doses of a medicine with allergen in it under your tongue. ?If these treatments do not work, your health care provider may prescribe newer, stronger medicines. ?Follow these instructions at home: ?Avoiding allergens ?Find out what you are allergic to and avoid those allergens. These are some things you can do to help avoid allergens: ?If you have perennial allergies: ?Replace carpet with wood, tile, or vinyl flooring. Carpet can trap dander and dust. ?Do not smoke. Do not allow smoking in your home. ?Change your heating and air conditioning filters at least once a month. ?If you have seasonal allergies, take these steps during allergy season: ?Keep windows closed as much as  possible. ?Plan outdoor activities when pollen counts are lowest. Check pollen counts before you plan outdoor  activities. ?When coming indoors, change clothing and shower before sitting on furniture or bedding. ?If you have a pet in the house that produces allergens: ?Keep the pet out of the bedroom. ?Vacuum, sweep, and dust regularly. ?General instructions ?Take over-the-counter and prescription medicines only as told by your health care provider. ?Drink enough fluid to keep your urine pale yellow. ?Keep all follow-up visits as told by your health care provider. This is important. ?Where to find more information ?American Academy of Allergy, Asthma & Immunology: www.aaaai.org ?Contact a health care provider if: ?You have a fever. ?You develop a cough that does not go away. ?You make whistling sounds when you breathe (wheeze). ?Your symptoms slow you down or stop you from doing your normal activities each day. ?Get help right away if: ?You have shortness of breath. ?This symptom may represent a serious problem that is an emergency. Do not wait to see if the symptom will go away. Get medical help right away. Call your local emergency services (911 in the [place] Do not drive yourself to the hospital. ?Summary ?Allergic rhinitis may be managed by taking medicines as directed and avoiding allergens. ?If you have seasonal allergies, keep windows closed as much as possible during allergy season. ?Contact your health care provider if you develop a fever or a cough that does not go away. ?This information is not intended to replace advice given to you by your health care provider. Make sure you discuss any questions you have with your health care provider. ?Document Revised: 11/04/2019 Document Reviewed: 09/13/2019 ?Elsevier Patient Education ? [place]. ? ?

## 2021-12-19 ENCOUNTER — Encounter: Payer: Self-pay | Admitting: Hematology

## 2021-12-19 LAB — LIPID PANEL
Cholesterol: 183 mg/dL (ref <200)
HDL: 55 mg/dL (ref >=40)
LDL Cholesterol (Calc): 112 mg/dL (calc) — ABNORMAL HIGH
Non-HDL Cholesterol (Calc): 128 mg/dL (calc) (ref <130)
Total CHOL/HDL Ratio: 3.3 (calc) (ref <5.0)
Triglycerides: 69 mg/dL (ref <150)

## 2021-12-19 LAB — COMPLETE METABOLIC PANEL WITH GFR
AG Ratio: 1.9 (calc) (ref 1.0–2.5)
ALT: 6 U/L — ABNORMAL LOW (ref 9–46)
AST: 7 U/L — ABNORMAL LOW (ref 10–35)
Albumin: 4.1 g/dL (ref 3.6–5.1)
Alkaline phosphatase (APISO): 61 U/L (ref 35–144)
BUN: 13 mg/dL (ref 7–25)
CO2: 27 mmol/L (ref 20–32)
Calcium: 9.5 mg/dL (ref 8.6–10.3)
Chloride: 109 mmol/L (ref 98–110)
Creat: 0.9 mg/dL (ref 0.70–1.22)
Globulin: 2.2 g/dL (calc) (ref 1.9–3.7)
Glucose, Bld: 77 mg/dL (ref 65–99)
Potassium: 3.9 mmol/L (ref 3.5–5.3)
Sodium: 144 mmol/L (ref 135–146)
Total Bilirubin: 0.6 mg/dL (ref 0.2–1.2)
Total Protein: 6.3 g/dL (ref 6.1–8.1)
eGFR: 86 mL/min/{1.73_m2} (ref >=60)

## 2021-12-19 LAB — CBC WITH DIFFERENTIAL/PLATELET
Absolute Monocytes: 432 cells/uL (ref 200–950)
Basophils Absolute: 48 cells/uL (ref 0–200)
Basophils Relative: 0.8 %
Eosinophils Absolute: 18 cells/uL (ref 15–500)
Eosinophils Relative: 0.3 %
HCT: 41.7 % (ref 38.5–50.0)
Hemoglobin: 13.3 g/dL (ref 13.2–17.1)
Lymphs Abs: 1770 cells/uL (ref 850–3900)
MCH: 29.3 pg (ref 27.0–33.0)
MCHC: 31.9 g/dL — ABNORMAL LOW (ref 32.0–36.0)
MCV: 91.9 fL (ref 80.0–100.0)
MPV: 12.2 fL (ref 7.5–12.5)
Monocytes Relative: 7.2 %
Neutro Abs: 3732 cells/uL (ref 1500–7800)
Neutrophils Relative %: 62.2 %
Platelets: 146 10*3/uL (ref 140–400)
RBC: 4.54 10*6/uL (ref 4.20–5.80)
RDW: 12.5 % (ref 11.0–15.0)
Total Lymphocyte: 29.5 %
WBC: 6 10*3/uL (ref 3.8–10.8)

## 2021-12-19 LAB — IRON, TOTAL/TOTAL IRON BINDING CAP
%SAT: 20 % (calc) (ref 20–48)
Iron: 62 ug/dL (ref 50–180)
TIBC: 309 mcg/dL (calc) (ref 250–425)

## 2021-12-27 DIAGNOSIS — E785 Hyperlipidemia, unspecified: Secondary | ICD-10-CM | POA: Diagnosis not present

## 2021-12-27 DIAGNOSIS — I10 Essential (primary) hypertension: Secondary | ICD-10-CM | POA: Diagnosis not present

## 2021-12-27 DIAGNOSIS — K219 Gastro-esophageal reflux disease without esophagitis: Secondary | ICD-10-CM | POA: Diagnosis not present

## 2022-01-09 ENCOUNTER — Encounter: Payer: Medicare HMO | Admitting: Adult Health

## 2022-01-15 ENCOUNTER — Ambulatory Visit: Payer: Medicare HMO | Admitting: Internal Medicine

## 2022-01-15 NOTE — Progress Notes (Signed)
? ? ? ? ? ? ? ? ? ? ? ? ? ? ? ? ? ? ? ? ? ? ? ? ? ? ? ? ? ? ? ? ? ? ? ? ? ? ? ? ? ? ? ? ? ? ? ? ? ? ? ? ? ? ? ? ? ? ? ? ? ? ? ? ? ? ? ? ? ? ? ? ? ? ? ? ? ? ? ? ? ? ? ? ? ? ? ? ? ? ? ? ? ? ? ? ? ? ? ? ? ? ? ? ? ? ? ? ? ? ? ? ? ? ? ? ? ? ? ? ? ? ? ? ? ? ? ? ? ? ? ? ? ? ? ? ? ? ? ? ? ? ? ? ? ? ? ? ? ? ? ? ? ? ? ? ? ? ? ? ? ? ? ? ? ? ? ? ? ? ? ? ? ? ? ? ? ? ? ? ? ? ? ? ? ? ? ? ? ? ? ? ? ? ? ? ? ? ? ? ? ? ? ? ? ? ? ? ? ? ? ? ? ? ? ? ? ? ? ? ? ? ? ? ? ? ? ? ? ? ? ?  Left    without    Being    Seen ? ? ? ? ? ? ? ? ? ? ? ? ? ? ? ? ? ? ? ? ? ? ? ? ?Future Appointments  ?Date Time Provider Department  ?01/15/2022                    [place] 11:30 AM Unk Pinto, MD [facility]  ?01/22/2022                [facility]        2:00 PM Unk Pinto, MD [facility]  ?03/05/2022 10:20 AM Horald Pollen, MD [facility]  ? ? ?History of Present Illness: ? ?    This very nice 81 y.o. MBM with  HTN, HLD, Moderate Vascular Dementia, CLL in Remission (Dr Irene Limbo) ,  ? ? ?

## 2022-01-21 ENCOUNTER — Telehealth: Payer: Self-pay

## 2022-01-21 NOTE — Telephone Encounter (Signed)
LM-01/21/22-Called pt. Wife [name] at [phone]  to schedule pts. F/u visit w/ CP. Unable to reach. [place]. ? ?Total time spent: 2 min. ?

## 2022-01-22 ENCOUNTER — Ambulatory Visit: Payer: Medicare HMO | Admitting: Internal Medicine

## 2022-01-22 NOTE — Progress Notes (Signed)
?  CPE Cancelled  ? ?Patient's Wife has scheduled him to establish with a new PCP ? ? ? ? ? ? ? ? ? ? ? ? ? ? ? ? ? ? ? ? ? ? ? ? ? ? ? ? ? ? ? ? ? ? ? ? ? ? ? ? ? ? ? ? ? ? ? ? ? ? ? ? ? ? ? ? ? ? ? ? ? ? ? ? ? ? ? ? ? ? ? ? ? ? ? ? ? ? ? ? ? ? ? ? ? ? ? ? ? ? ? ? ? ? ? ? ? ? ? ? ? ? ? ? ? ? ? ? ? ? ? ? ? ? ? ? ? ? ? ? ? ? ? ? ? ? ? ? ? ? ? ? ? ? ? ? ? ? ? ? ? ? ? ? ? ? ? ? ? ? ? ? ? ? ? ? ? ? ? ? ? ? ? ? ? ? ? ? ? ? ? ? ? ? ? ? ? ? ? ? ? ? ? ? ? ? ? ? ? ? ? ? ? ? ? ? ? ? ? ? ? ? ? ? ? ? ? ? ? ? ? ? ? ? ? ? ? ? ? ? ? ? ? ? ? ? ? ? ? ? ? ? ? ? ? ? ? ? ? ? ? ? ? ? ? ? ? ? ? ? ? ? ? ? ? ? ? ? ? ? ? ? ? ? ? ? ? ? ? ? ? ? ?Future Appointments  ?Date Time Provider Department  ?01/22/2022  2:00 PM Unk Pinto, MD [facility]  ?03/05/2022 10:20 AM Horald Pollen, MD [facility]  ?01/27/2023  2:00 PM Unk Pinto, MD [facility]  ? ?    ?     This very nice 81 y.o. MBM presents for a Screening /Preventative Visit & comprehensive evaluation and management of multiple medical co-morbidities.  Patient has been followed for  HTN, HLD, Moderate Vascular Dementia, CLL in Remission (Dr Irene Limbo) Prediabetes and Vitamin D Deficiency.  Patient has Aortic Atherosclerosis by CT scan. ? ? ?    HTN predates since  2004. Patient's BP has been controlled at home.   Patient was hospitalized in Nov 2022 with a TIA> Today's  . Patient denies any cardiac symptoms as chest pain, palpitations, shortness of breath, dizziness or ankle swelling. ?    Patient's hyperlipidemia is  not controlled with diet and  ? compliance with Rosuvastatin. Patient denies myalgias or other medication SE's. Last lipids were  not at goal : ?Lab Results  ?Component Value Date  ? CHOL 183 12/18/2021  ? HDL 55 12/18/2021  ? LDLCALC 112 (H) 12/18/2021  ? TRIG 69 12/18/2021  ? CHOLHDL 3.3 12/18/2021  ? ?    Patient has hx/o prediabetes (A1c 5.8% /2010) and patient denies reactive hypoglycemic symptoms, visual blurring, diabetic polys or paresthesias. Last A1c was normal &at  goal :  ?Lab Results  ?Component Value Date  ? HGBA1C 5.6 08/10/2021  ?   Finally, patient has history of Vitamin D Deficiency ("50" on Tx  /2008)     and last vitamin D was at goal : ?Lab Results  ?Component Value Date  ? VD25OH 89 08/27/2021  ? ?

## 2022-01-22 NOTE — Telephone Encounter (Signed)
LM-01/22/22-Pts. Wife returned call yesterday and LVM. Returning call to schedule pt. For f/u cp visit. Spoke to pts. Wife [name] and scheduled pt. For 05/27/22 at 3pm w/ CP. ? ?Total time spent: 3 min ?

## 2022-02-03 ENCOUNTER — Other Ambulatory Visit: Payer: Self-pay | Admitting: Nurse Practitioner

## 2022-02-03 DIAGNOSIS — F419 Anxiety disorder, unspecified: Secondary | ICD-10-CM

## 2022-02-20 ENCOUNTER — Encounter: Payer: Self-pay | Admitting: Hematology

## 2022-03-05 ENCOUNTER — Encounter: Payer: Self-pay | Admitting: Emergency Medicine

## 2022-03-05 ENCOUNTER — Other Ambulatory Visit: Payer: Self-pay | Admitting: Oncology

## 2022-03-05 ENCOUNTER — Ambulatory Visit (INDEPENDENT_AMBULATORY_CARE_PROVIDER_SITE_OTHER): Payer: Medicare HMO | Admitting: Emergency Medicine

## 2022-03-05 VITALS — BP 132/70 | HR 65 | Temp 97.8°F | Ht 71.0 in | Wt 152.2 lb

## 2022-03-05 DIAGNOSIS — N401 Enlarged prostate with lower urinary tract symptoms: Secondary | ICD-10-CM | POA: Diagnosis not present

## 2022-03-05 DIAGNOSIS — Z7689 Persons encountering health services in other specified circumstances: Secondary | ICD-10-CM

## 2022-03-05 DIAGNOSIS — N138 Other obstructive and reflux uropathy: Secondary | ICD-10-CM

## 2022-03-05 DIAGNOSIS — E782 Mixed hyperlipidemia: Secondary | ICD-10-CM

## 2022-03-05 DIAGNOSIS — R269 Unspecified abnormalities of gait and mobility: Secondary | ICD-10-CM

## 2022-03-05 DIAGNOSIS — I10 Essential (primary) hypertension: Secondary | ICD-10-CM | POA: Diagnosis not present

## 2022-03-05 DIAGNOSIS — Z9181 History of falling: Secondary | ICD-10-CM

## 2022-03-05 DIAGNOSIS — R531 Weakness: Secondary | ICD-10-CM | POA: Diagnosis not present

## 2022-03-05 DIAGNOSIS — C9110 Chronic lymphocytic leukemia of B-cell type not having achieved remission: Secondary | ICD-10-CM | POA: Diagnosis not present

## 2022-03-05 DIAGNOSIS — F01B18 Vascular dementia, moderate, with other behavioral disturbance: Secondary | ICD-10-CM | POA: Diagnosis not present

## 2022-03-05 DIAGNOSIS — Z856 Personal history of leukemia: Secondary | ICD-10-CM

## 2022-03-05 NOTE — Assessment & Plan Note (Signed)
Stable.  On no medications.

## 2022-03-05 NOTE — Assessment & Plan Note (Signed)
Well-controlled.  Continue losartan 100 mg daily. BP Readings from Last 3 Encounters:  03/05/22 132/70  12/18/21 (!) 146/64  08/27/21 138/70

## 2022-03-05 NOTE — Assessment & Plan Note (Signed)
Stable. In remission.

## 2022-03-05 NOTE — Progress Notes (Addendum)
Javier [name] 81 y.o.   Chief Complaint  Patient presents with   New Patient (Initial Visit)   Extremity Weakness    Weakness that time, issues with balance     HISTORY OF PRESENT ILLNESS: This is a 81 y.o. male first visit to this office, here to establish care with me. Has history of vascular dementia.  Sees neurologist on a regular basis. Has history of chronic gait and balance problems.  2 falls in the past several weeks without injuries.  Uses walker.  Has generalized weakness. Also has history of hypertension on losartan 100 mg History of BPH.  Not taking Flomax History of dyslipidemia.  On rosuvastatin 20 mg daily History of vascular dementia with anxiety.  On Seroquel at bedtime and Zoloft 50 mg daily.  Also take BuSpar 3 times a day. No other complaints or medical concerns today.  HPI   Prior to Admission medications   Medication Sig Start Date End Date Taking? Authorizing Provider  aspirin EC 81 MG tablet Take 81 mg by mouth daily.   Yes [provider]  busPIRone (BUSPAR) 5 MG tablet TAKE 1 TABLET BY MOUTH THREE TIMES DAILY 02/03/22  Yes Alycia Rossetti, NP  Cholecalciferol (VITAMIN D3) 5000 units TABS Take 5,000 Units by mouth daily.   Yes [provider]  losartan (COZAAR) 100 MG tablet Take 1 tablet (100 mg total) by mouth daily. 09/18/21  Yes Unk Pinto, MD  QUEtiapine (SEROQUEL) 25 MG tablet Take 1 tablet (25 mg total) by mouth at bedtime. 12/18/21  Yes Alycia Rossetti, NP  rosuvastatin (CRESTOR) 20 MG tablet Take  1 tablet  Daily  for Cholesterol 09/11/21  Yes Unk Pinto, MD  sertraline (ZOLOFT) 50 MG tablet Take  1 tablet  Daily  for Mood  / Chronic Anxiety 09/10/21  Yes Unk Pinto, MD  Dextromethorphan-guaiFENesin [facility] COUGH & CHEST CONGEST PO) Take by mouth. Patient not taking: Reported on 03/05/2022    [provider]  fluticasone (FLONASE) 50 MCG/ACT nasal spray Place 2 sprays into both nostrils  daily. Patient not taking: Reported on 03/05/2022 12/18/21 12/18/22  Alycia Rossetti, NP  lipase/protease/amylase (CREON) 12000-38000 units CPEP capsule Take 12,000 Units by mouth 3 (three) times daily with meals. Patient not taking: Reported on 12/18/2021    [provider]  tamsulosin (FLOMAX) 0.4 MG CAPS capsule Take 1 capsule (0.4 mg total) by mouth daily after supper. For prostate. 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    Patient Active Problem List   Diagnosis Date Noted   Memory loss 10/29/2021   Pancreatic insufficiency 08/20/2021   TIA (transient ischemic attack) 08/17/2021   Left-sided weakness 08/10/2021   Weakness 08/09/2021   Personal history of CLL (chronic lymphocytic leukemia) 08/09/2021   Pancytopenia ([place]) 08/09/2021   Fall at home, initial encounter 08/09/2021   Influenza A 08/09/2021   At high risk for falls 04/29/2021   Vascular dementia ([place]) 03/28/2021   Sleeping difficulty 03/28/2021   Agitation 03/28/2021   History of lacunar cerebrovascular accident 01/09/2021   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   BPH with obstruction/lower urinary tract symptoms 01/09/2021   First degree AV block 01/09/2021   Gait abnormality 12/20/2020   Confusion 12/20/2020  BMI 26.0-26.9,adult 03/06/2020   Gastric AVM 03/06/2020   History of colon polyps 03/06/2020   Aortic atherosclerosis ([place]) - per CT 04/2016 08/23/2019   Memory changes 05/25/2019   Counseling regarding advance care planning and goals of care 01/24/2019   Anxiety 02/08/2018   CLL (chronic lymphocytic leukemia) ([place]) 11/01/2016   Encounter for Medicare annual wellness exam 09/11/2015   GERD  02/09/2015   Medication management 11/02/2013    Hyperlipidemia    Hypertension    Abnormal glucose    Vitamin D deficiency    Diverticulosis     Past Medical History:  Diagnosis Date   Diverticulosis    Hyperlipidemia    Hypertension    Memory loss    Prediabetes    Vitamin D deficiency     Past Surgical History:  Procedure Laterality Date   NO PAST SURGERIES      Social History   Socioeconomic History   Marital status: Married    Spouse name: Not on file   Number of children: 5   Years of education: 9th grade   Highest education level: Not on file  Occupational History   Occupation: retired  Tobacco Use   Smoking status: Former    Types: Cigarettes    Quit date: 01/17/2003    Years since quitting: 19.1   Smokeless tobacco: Never  Vaping Use   Vaping Use: Never used  Substance and Sexual Activity   Alcohol use: Not Currently    Comment: very rare   Drug use: No   Sexual activity: Not on file  Other Topics Concern   Not on file  Social History Narrative   Lives at home with his wife.   Right-handed.   At most, one cup caffeine per day.    Social Determinants of Health   Financial Resource Strain: Not on file  Food Insecurity: No Food Insecurity   Worried About Charity fundraiser in the Last Year: Never true   Ran Out of Food in the Last Year: Never true  Transportation Needs: No Transportation Needs   Lack of Transportation (Medical): No   Lack of Transportation (Non-Medical): No  Physical Activity: Not on file  Stress: Not on file  Social Connections: Not on file  Intimate Partner Violence: Not on file    Family History  Problem Relation Age of Onset   Stroke Mother    Early death Father        Farm/tractor accident   Diabetes Brother    Cirrhosis Brother    Colon cancer Paternal Grandfather      Review of Systems  Constitutional: Negative.  Negative for chills and fever.  HENT: Negative.  Negative for congestion and sore throat.   Respiratory: Negative.  Negative for cough and  shortness of breath.   Cardiovascular:  Negative for chest pain and palpitations.  Gastrointestinal:  Negative for abdominal pain, diarrhea, nausea and vomiting.  Genitourinary: Negative.   Skin: Negative.  Negative for rash.  Neurological:  Positive for weakness. Negative for dizziness and headaches.  All other systems reviewed and are negative. Today's Vitals   03/05/22 1043  BP: 132/70  Pulse: 65  Temp: 97.8 F (36.6 C)  TempSrc: Oral  SpO2: 95%  Weight: 152 lb 4 oz (69.1 kg)  Height: '5\' 11"'$  (1.803 m)   Body mass index is 21.23 kg/m.   Physical Exam Vitals reviewed.  Constitutional:      Appearance: Normal appearance.  HENT:     Head: Normocephalic.  Mouth/Throat:     Mouth: Mucous membranes are moist.     Pharynx: Oropharynx is clear.  Eyes:     Extraocular Movements: Extraocular movements intact.     Pupils: Pupils are equal, round, and reactive to light.  Cardiovascular:     Rate and Rhythm: Normal rate and regular rhythm.     Pulses: Normal pulses.     Heart sounds: Normal heart sounds.  Pulmonary:     Effort: Pulmonary effort is normal.     Breath sounds: Normal breath sounds.  Abdominal:     Palpations: Abdomen is soft.     Tenderness: There is no abdominal tenderness.  Musculoskeletal:     Cervical back: No tenderness.     Right lower leg: No edema.     Left lower leg: No edema.  Lymphadenopathy:     Cervical: No cervical adenopathy.  Skin:    General: Skin is warm and dry.  Neurological:     General: No focal deficit present.     Mental Status: He is alert and oriented to person, place, and time.  Psychiatric:        Mood and Affect: Mood normal.        Behavior: Behavior normal.     ASSESSMENT & PLAN: A total of 60 minutes was spent with the patient and counseling/coordination of care regarding preparing for this visit, review of available medical records, establishing care with me, comprehensive history and physical examination, review of  multiple chronic medical problems and their management, review of all medications, review of most recent blood work results, prognosis, documentation and need for follow-up.  Problem List Items Addressed This Visit       Cardiovascular and Mediastinum   Hypertension - Primary    Well-controlled.  Continue losartan 100 mg daily. BP Readings from Last 3 Encounters:  03/05/22 132/70  12/18/21 (!) 146/64  08/27/21 138/70           Nervous and Auditory   Vascular dementia (HCC)    Stable.  Sees neurologist on a regular basis. Has anxiety features.  Takes Seroquel 25 mg at bedtime.  Continue daily Zoloft 50 mg and BuSpar 3 times a day. May need new referral.         Genitourinary   BPH with obstruction/lower urinary tract symptoms    Stable.  On no medications.         Other   Hyperlipidemia    Stable.  Continue rosuvastatin 20 mg daily.       CLL (chronic lymphocytic leukemia) (HCC)    Stable.  In remission.       Gait abnormality    At high risk for falls.  Uses walker.  Advised to use it all the time.       At high risk for falls   Generalized weakness    Multifactorial.  Clinically stable.  Recent blood work shows unremarkable labs with acceptable values.  Has had decreased appetite. Diet and nutrition discussed.       Personal history of CLL (chronic lymphocytic leukemia)   Other Visit Diagnoses     Encounter to establish care            Patient Instructions  Health Maintenance After Age 64 After age 51, you are at a higher risk for certain long-term diseases and infections as well as injuries from falls. Falls are a major cause of broken bones and head injuries in people who are older than age 46. Getting  regular preventive care can help to keep you healthy and well. Preventive care includes getting regular testing and making lifestyle changes as recommended by your health care provider. Talk with your health care provider about: Which screenings  and tests you should have. A screening is a test that checks for a disease when you have no symptoms. A diet and exercise plan that is right for you. What should I know about screenings and tests to prevent falls? Screening and testing are the best ways to find a health problem early. Early diagnosis and treatment give you the best chance of managing medical conditions that are common after age 52. Certain conditions and lifestyle choices may make you more likely to have a fall. Your health care provider may recommend: Regular vision checks. Poor vision and conditions such as cataracts can make you more likely to have a fall. If you wear glasses, make sure to get your prescription updated if your vision changes. Medicine review. Work with your health care provider to regularly review all of the medicines you are taking, including over-the-counter medicines. Ask your health care provider about any side effects that may make you more likely to have a fall. Tell your health care provider if any medicines that you take make you feel dizzy or sleepy. Strength and balance checks. Your health care provider may recommend certain tests to check your strength and balance while standing, walking, or changing positions. Foot health exam. Foot pain and numbness, as well as not wearing proper footwear, can make you more likely to have a fall. Screenings, including: Osteoporosis screening. Osteoporosis is a condition that causes the bones to get weaker and break more easily. Blood pressure screening. Blood pressure changes and medicines to control blood pressure can make you feel dizzy. Depression screening. You may be more likely to have a fall if you have a fear of falling, feel depressed, or feel unable to do activities that you used to do. Alcohol use screening. Using too much alcohol can affect your balance and may make you more likely to have a fall. Follow these instructions at home: Lifestyle Do not drink  alcohol if: Your health care provider tells you not to drink. If you drink alcohol: Limit how much you have to: 0-1 drink a day for women. 0-2 drinks a day for men. Know how much alcohol is in your drink. In the [place] one drink equals one 12 oz bottle of beer (355 mL), one 5 oz glass of wine (148 mL), or one 1 oz glass of hard liquor (44 mL). Do not use any products that contain nicotine or tobacco. These products include cigarettes, chewing tobacco, and vaping devices, such as e-cigarettes. If you need help quitting, ask your health care provider. Activity  Follow a regular exercise program to stay fit. This will help you maintain your balance. Ask your health care provider what types of exercise are appropriate for you. If you need a cane or walker, use it as recommended by your health care provider. Wear supportive shoes that have nonskid soles. Safety  Remove any tripping hazards, such as rugs, cords, and clutter. Install safety equipment such as grab bars in bathrooms and safety rails on stairs. Keep rooms and walkways well-lit. General instructions Talk with your health care provider about your risks for falling. Tell your health care provider if: You fall. Be sure to tell your health care provider about all falls, even ones that seem minor. You feel dizzy, tiredness (fatigue), or off-balance.  Take over-the-counter and prescription medicines only as told by your health care provider. These include supplements. Eat a healthy diet and maintain a healthy weight. A healthy diet includes low-fat dairy products, low-fat (lean) meats, and fiber from whole grains, beans, and lots of fruits and vegetables. Stay current with your vaccines. Schedule regular health, dental, and eye exams. Summary Having a healthy lifestyle and getting preventive care can help to protect your health and wellness after age 46. Screening and testing are the best way to find a health problem early and help you  avoid having a fall. Early diagnosis and treatment give you the best chance for managing medical conditions that are more common for people who are older than age 44. Falls are a major cause of broken bones and head injuries in people who are older than age 36. Take precautions to prevent a fall at home. Work with your health care provider to learn what changes you can make to improve your health and wellness and to prevent falls. This information is not intended to replace advice given to you by your health care provider. Make sure you discuss any questions you have with your health care provider. Document Revised: 02/04/2021 Document Reviewed: 02/04/2021 Elsevier Patient Education  [place], MD [facility]

## 2022-03-05 NOTE — Assessment & Plan Note (Signed)
Stable.  Continue rosuvastatin 20 mg daily. ?

## 2022-03-05 NOTE — Patient Instructions (Signed)
Health Maintenance After Age 81 After age 81, you are at a higher risk for certain long-term diseases and infections as well as injuries from falls. Falls are a major cause of broken bones and head injuries in people who are older than age 81. Getting regular preventive care can help to keep you healthy and well. Preventive care includes getting regular testing and making lifestyle changes as recommended by your health care provider. Talk with your health care provider about: Which screenings and tests you should have. A screening is a test that checks for a disease when you have no symptoms. A diet and exercise plan that is right for you. What should I know about screenings and tests to prevent falls? Screening and testing are the best ways to find a health problem early. Early diagnosis and treatment give you the best chance of managing medical conditions that are common after age 81. Certain conditions and lifestyle choices may make you more likely to have a fall. Your health care provider may recommend: Regular vision checks. Poor vision and conditions such as cataracts can make you more likely to have a fall. If you wear glasses, make sure to get your prescription updated if your vision changes. Medicine review. Work with your health care provider to regularly review all of the medicines you are taking, including over-the-counter medicines. Ask your health care provider about any side effects that may make you more likely to have a fall. Tell your health care provider if any medicines that you take make you feel dizzy or sleepy. Strength and balance checks. Your health care provider may recommend certain tests to check your strength and balance while standing, walking, or changing positions. Foot health exam. Foot pain and numbness, as well as not wearing proper footwear, can make you more likely to have a fall. Screenings, including: Osteoporosis screening. Osteoporosis is a condition that causes  the bones to get weaker and break more easily. Blood pressure screening. Blood pressure changes and medicines to control blood pressure can make you feel dizzy. Depression screening. You may be more likely to have a fall if you have a fear of falling, feel depressed, or feel unable to do activities that you used to do. Alcohol use screening. Using too much alcohol can affect your balance and may make you more likely to have a fall. Follow these instructions at home: Lifestyle Do not drink alcohol if: Your health care provider tells you not to drink. If you drink alcohol: Limit how much you have to: 0-1 drink a day for women. 0-2 drinks a day for men. Know how much alcohol is in your drink. In the [place] one drink equals one 12 oz bottle of beer (355 mL), one 5 oz glass of wine (148 mL), or one 1 oz glass of hard liquor (44 mL). Do not use any products that contain nicotine or tobacco. These products include cigarettes, chewing tobacco, and vaping devices, such as e-cigarettes. If you need help quitting, ask your health care provider. Activity  Follow a regular exercise program to stay fit. This will help you maintain your balance. Ask your health care provider what types of exercise are appropriate for you. If you need a cane or walker, use it as recommended by your health care provider. Wear supportive shoes that have nonskid soles. Safety  Remove any tripping hazards, such as rugs, cords, and clutter. Install safety equipment such as grab bars in bathrooms and safety rails on stairs. Keep rooms and walkways   well-lit. General instructions Talk with your health care provider about your risks for falling. Tell your health care provider if: You fall. Be sure to tell your health care provider about all falls, even ones that seem minor. You feel dizzy, tiredness (fatigue), or off-balance. Take over-the-counter and prescription medicines only as told by your health care provider. These include  supplements. Eat a healthy diet and maintain a healthy weight. A healthy diet includes low-fat dairy products, low-fat (lean) meats, and fiber from whole grains, beans, and lots of fruits and vegetables. Stay current with your vaccines. Schedule regular health, dental, and eye exams. Summary Having a healthy lifestyle and getting preventive care can help to protect your health and wellness after age 81. Screening and testing are the best way to find a health problem early and help you avoid having a fall. Early diagnosis and treatment give you the best chance for managing medical conditions that are more common for people who are older than age 81. Falls are a major cause of broken bones and head injuries in people who are older than age 81. Take precautions to prevent a fall at home. Work with your health care provider to learn what changes you can make to improve your health and wellness and to prevent falls. This information is not intended to replace advice given to you by your health care provider. Make sure you discuss any questions you have with your health care provider. Document Revised: 02/04/2021 Document Reviewed: 02/04/2021 Elsevier Patient Education  2023 Elsevier Inc.  

## 2022-03-05 NOTE — Assessment & Plan Note (Addendum)
Stable.  Sees neurologist on a regular basis. Has anxiety features.  Takes Seroquel 25 mg at bedtime.  Continue daily Zoloft 50 mg and BuSpar 3 times a day. May need new referral.

## 2022-03-05 NOTE — Assessment & Plan Note (Signed)
Multifactorial.  Clinically stable.  Recent blood work shows unremarkable labs with acceptable values.  Has had decreased appetite. Diet and nutrition discussed.

## 2022-03-05 NOTE — Assessment & Plan Note (Signed)
At high risk for falls.  Uses walker.  Advised to use it all the time.

## 2022-03-06 ENCOUNTER — Other Ambulatory Visit: Payer: Self-pay | Admitting: Internal Medicine

## 2022-03-22 ENCOUNTER — Other Ambulatory Visit: Payer: Self-pay | Admitting: Nurse Practitioner

## 2022-03-22 DIAGNOSIS — F419 Anxiety disorder, unspecified: Secondary | ICD-10-CM

## 2022-04-13 ENCOUNTER — Other Ambulatory Visit: Payer: Self-pay | Admitting: Nurse Practitioner

## 2022-04-13 DIAGNOSIS — F419 Anxiety disorder, unspecified: Secondary | ICD-10-CM

## 2022-04-16 DIAGNOSIS — H5213 Myopia, bilateral: Secondary | ICD-10-CM | POA: Diagnosis not present

## 2022-04-17 DIAGNOSIS — H524 Presbyopia: Secondary | ICD-10-CM | POA: Diagnosis not present

## 2022-04-28 ENCOUNTER — Other Ambulatory Visit: Payer: Self-pay

## 2022-04-28 NOTE — Patient Outreach (Signed)
[place] [facility]) Care Management  04/28/2022  Javier [name] 05-10-1941 [id number]   Telephone call to patient for nurse call.  No answer.  Unable to leave a message.  Plan: RN CM will attempt again within 4 business days and send letter.  [name], RN, MSN [facility] Care Management Care Management Coordinator Direct Line [phone] Toll Free: [phone]  Fax: [phone]

## 2022-04-29 ENCOUNTER — Other Ambulatory Visit: Payer: Self-pay

## 2022-04-29 NOTE — Patient Outreach (Signed)
[facility]) Care Management  04/29/2022  Javier [name] 07-12-1941 [id number]   Telephone call to patient and his [name] for nurse call. Wife is appreciative of call and glad for a nurse to connect to.  Patient care is where she can handle right now.  Discussed HTN and care.  Discussed [facility] services. They are agreeable to nurse calls.    Care Plan : RN CM Plan of Care  Updates made by Jon Billings, RN since 04/29/2022 12:00 AM     Problem: Chronic Disease Management and Care Coordination Needs HTN   Priority: High     Long-Range Goal: Development of Plan of Care for Management of HTN   Start Date: 04/29/2022  Expected End Date: 09/28/2022  Note:   Current Barriers:  Chronic Disease Management support and education needs related to HTN   RNCM Clinical Goal(s):  Patient will verbalize basic understanding of  HTN disease process and self health management plan as evidenced by blood pressure reports less than 140/80  through collaboration with RN Care manager, provider, and care team.   Interventions: Education and support related to HTN Inter-disciplinary care team collaboration (see longitudinal plan of care) Evaluation of current treatment plan related to  self management and patient's adherence to plan as established by provider   Hypertension Interventions:  (Status:  New goal.) Long Term Goal Last practice recorded BP readings:  BP Readings from Last 3 Encounters:  03/05/22 132/70  12/18/21 (!) 146/64  08/27/21 138/70  Most recent eGFR/CrCl:  Lab Results  Component Value Date   EGFR 86 12/18/2021    No components found for: "CRCL"  Evaluation of current treatment plan related to hypertension self management and patient's adherence to plan as established by provider Reviewed medications with patient and discussed importance of compliance  Patient Goals/Self-Care Activities: Take all medications as prescribed Attend all scheduled provider  appointments check blood pressure weekly keep a blood pressure log take blood pressure log to all doctor appointments call doctor for signs and symptoms of high blood pressure  Follow Up Plan:  Telephone follow up appointment with care management team member scheduled for:  November The patient has been provided with contact information for the care management team and has been advised to call with any health related questions or concerns.      Plan: RN CM will provide ongoing education and support to patient through phone calls.   RN CM will send welcome packet with consent to patient.   RN CM will send initial barriers letter, assessment, and care plan to primary care physician.   RN CM will contact patient  in November and patient agrees to next contact and care plan.   [name], RN, MSN [facility] Care Management Care Management Coordinator Direct Line [phone] Toll Free: [phone]  Fax: [phone]

## 2022-04-29 NOTE — Patient Instructions (Signed)
Patient Goals/Self-Care Activities: Take all medications as prescribed Attend all scheduled provider appointments check blood pressure weekly keep a blood pressure log take blood pressure log to all doctor appointments call doctor for signs and symptoms of high blood pressure

## 2022-04-30 ENCOUNTER — Other Ambulatory Visit: Payer: Self-pay

## 2022-04-30 ENCOUNTER — Emergency Department (HOSPITAL_BASED_OUTPATIENT_CLINIC_OR_DEPARTMENT_OTHER): Payer: No Typology Code available for payment source

## 2022-04-30 ENCOUNTER — Other Ambulatory Visit (HOSPITAL_BASED_OUTPATIENT_CLINIC_OR_DEPARTMENT_OTHER): Payer: Self-pay

## 2022-04-30 ENCOUNTER — Encounter (HOSPITAL_BASED_OUTPATIENT_CLINIC_OR_DEPARTMENT_OTHER): Payer: Self-pay | Admitting: Emergency Medicine

## 2022-04-30 ENCOUNTER — Ambulatory Visit: Payer: Self-pay

## 2022-04-30 ENCOUNTER — Emergency Department (HOSPITAL_BASED_OUTPATIENT_CLINIC_OR_DEPARTMENT_OTHER)
Admission: EM | Admit: 2022-04-30 | Discharge: 2022-04-30 | Disposition: A | Discharge status: Home / Self Care | Payer: No Typology Code available for payment source | Attending: Emergency Medicine | Admitting: Emergency Medicine

## 2022-04-30 DIAGNOSIS — D649 Anemia, unspecified: Secondary | ICD-10-CM | POA: Insufficient documentation

## 2022-04-30 DIAGNOSIS — K625 Hemorrhage of anus and rectum: Secondary | ICD-10-CM | POA: Diagnosis present

## 2022-04-30 DIAGNOSIS — R109 Unspecified abdominal pain: Secondary | ICD-10-CM | POA: Diagnosis not present

## 2022-04-30 DIAGNOSIS — K5900 Constipation, unspecified: Secondary | ICD-10-CM | POA: Diagnosis not present

## 2022-04-30 DIAGNOSIS — I10 Essential (primary) hypertension: Secondary | ICD-10-CM | POA: Insufficient documentation

## 2022-04-30 DIAGNOSIS — K644 Residual hemorrhoidal skin tags: Secondary | ICD-10-CM | POA: Diagnosis not present

## 2022-04-30 LAB — COMPREHENSIVE METABOLIC PANEL
ALT: 13 U/L (ref 0–44)
AST: 13 U/L — ABNORMAL LOW (ref 15–41)
Albumin: 4.5 g/dL (ref 3.5–5.0)
Alkaline Phosphatase: 54 U/L (ref 38–126)
Anion gap: 11 (ref 5–15)
BUN: 12 mg/dL (ref 8–23)
CO2: 25 mmol/L (ref 22–32)
Calcium: 10.1 mg/dL (ref 8.9–10.3)
Chloride: 107 mmol/L (ref 98–111)
Creatinine, Ser: 0.87 mg/dL (ref 0.61–1.24)
GFR, Estimated: >60 mL/min (ref >60)
Glucose, Bld: 94 mg/dL (ref 70–99)
Potassium: 3.8 mmol/L (ref 3.5–5.1)
Sodium: 143 mmol/L (ref 135–145)
Total Bilirubin: 0.6 mg/dL (ref 0.3–1.2)
Total Protein: 6.5 g/dL (ref 6.5–8.1)

## 2022-04-30 LAB — CBC
HCT: 37.4 % — ABNORMAL LOW (ref 39.0–52.0)
Hemoglobin: 12.4 g/dL — ABNORMAL LOW (ref 13.0–17.0)
MCH: 30.5 pg (ref 26.0–34.0)
MCHC: 33.2 g/dL (ref 30.0–36.0)
MCV: 91.9 fL (ref 80.0–100.0)
Platelets: 138 10*3/uL — ABNORMAL LOW (ref 150–400)
RBC: 4.07 MIL/uL — ABNORMAL LOW (ref 4.22–5.81)
RDW: 13.3 % (ref 11.5–15.5)
WBC: 5.6 10*3/uL (ref 4.0–10.5)
nRBC: 0 % (ref 0.0–0.2)

## 2022-04-30 LAB — URINALYSIS, ROUTINE W REFLEX MICROSCOPIC
Bilirubin Urine: NEGATIVE
Glucose, UA: NEGATIVE mg/dL
Hgb urine dipstick: NEGATIVE
Ketones, ur: NEGATIVE mg/dL
Leukocytes,Ua: NEGATIVE
Nitrite: NEGATIVE
Protein, ur: NEGATIVE mg/dL
Specific Gravity, Urine: 1.013 (ref 1.005–1.030)
pH: 6.5 (ref 5.0–8.0)

## 2022-04-30 LAB — OCCULT BLOOD X 1 CARD TO LAB, STOOL: Fecal Occult Bld: NEGATIVE

## 2022-04-30 MED ORDER — SENNOSIDES-DOCUSATE SODIUM 8.6-50 MG PO TABS
1.0000 | ORAL_TABLET | Freq: Every evening | ORAL | 0 refills | Status: DC | PRN
  Filled 2022-04-30: qty 100, 100d supply, fill #0

## 2022-04-30 MED ORDER — IOHEXOL 300 MG/ML  SOLN
100.0000 mL | Freq: Once | INTRAMUSCULAR | Status: AC | PRN
  Administered 2022-04-30: 80 mL via INTRAVENOUS

## 2022-04-30 MED ORDER — HYDROCORTISONE (PERIANAL) 2.5 % EX CREA
1.0000 | TOPICAL_CREAM | Freq: Two times a day (BID) | CUTANEOUS | 0 refills | Status: DC
  Filled 2022-04-30: qty 30, 20d supply, fill #0

## 2022-04-30 NOTE — ED Triage Notes (Signed)
Pt arrives to ED with c/o rectal bleeding. Pt reports x3 episodes of BRBPR that started this morning. Pt denies abdominal and rectal pain.

## 2022-04-30 NOTE — Discharge Instructions (Signed)
You were seen in the emergency room today with intermittent rectal bleeding.  You do have constipation as well as external hemorrhoids on the exam today.  We discussed bowel regimen at home including an enema along with other laxative type medications.  I am calling in some rectal cream to use on your hemorrhoid which should help reduce the size.  You will need to follow with your primary care physician in the coming week and return with any new or suddenly worsening symptoms.

## 2022-04-30 NOTE — ED Notes (Signed)
Pt offered a Primofit or condom cath. Pt's spouse refused. Pt given an extra urinal.

## 2022-04-30 NOTE — ED Provider Notes (Signed)
Emergency Department Provider Note   I have reviewed the triage vital signs and the nursing notes.   HISTORY  Chief Complaint Rectal Bleeding   HPI Javier [name] is a 81 y.o. male with PMH of diverticulosis, hypertension, hyperlipidemia presents to the emergency department with constipation and bright red blood per rectum this morning.  Patient has not had a bowel movement in last 3 days but this morning was able to pass some hard stool.  During the bowel movement he had some discomfort as well as some red discoloration mixed into the bowel movement and in the water.  No black, sticky stool.  No diarrhea.  He had 3 such episodes this morning which prompted his ED visit.  He is not experiencing fatigue, lightheadedness, shortness of breath.  No anterior abdominal pain.  He is not anticoagulated.  Does have a remote history of hemorrhoids which required surgery but no issues with them in many years.    Past Medical History:  Diagnosis Date   Diverticulosis    Hyperlipidemia    Hypertension    Memory loss    Prediabetes    Vitamin D deficiency     Review of Systems  Constitutional: No fever/chills Eyes: No visual changes. ENT: No sore throat. Cardiovascular: Denies chest pain. Respiratory: Denies shortness of breath. Gastrointestinal: No abdominal pain.  No nausea, no vomiting.  No diarrhea. Positive rectal pain and bleeding with constipation Genitourinary: Negative for dysuria. Musculoskeletal: Negative for back pain. Skin: Negative for rash. Neurological: Negative for headaches, focal weakness or numbness.   ____________________________________________   PHYSICAL EXAM:  VITAL SIGNS: ED Triage Vitals  Enc Vitals Group     BP 04/30/22 1050 (!) 172/82     Pulse Rate 04/30/22 1050 (!) 59     Resp 04/30/22 1050 18     Temp 04/30/22 1050 98.1 F (36.7 C)     Temp src --      SpO2 04/30/22 1050 99 %     Weight 04/30/22 1047 152 lb (68.9 kg)     Height 04/30/22  1047 '5\' 11"'$  (1.803 m)   Constitutional: Alert and oriented. Well appearing and in no acute distress. Eyes: Conjunctivae are normal. Head: Atraumatic. Nose: No congestion/rhinnorhea. Mouth/Throat: Mucous membranes are moist.  Cardiovascular: Normal rate, regular rhythm. Good peripheral circulation. Grossly normal heart sounds.   Respiratory: Normal respiratory effort.  No retractions. Lungs CTAB. Gastrointestinal: Soft with mild LLQ tenderness. No distention.  Rectal exam performed with patient's verbal consent.  I do see a nonthrombosed hemorrhoid just inside the anus with no active bleeding on digital rectal exam there is some trace red material but no melena.  No visualized fissure. Musculoskeletal: No lower extremity tenderness nor edema. No gross deformities of extremities. Neurologic:  Normal speech and language. No gross focal neurologic deficits are appreciated.  Skin:  Skin is warm, dry and intact. No rash noted.  ____________________________________________   LABS (all labs ordered are listed, but only abnormal results are displayed)  Labs Reviewed  COMPREHENSIVE METABOLIC PANEL - Abnormal; Notable for the following components:      Result Value   AST 13 (*)    All other components within normal limits  CBC - Abnormal; Notable for the following components:   RBC 4.07 (*)    Hemoglobin 12.4 (*)    HCT 37.4 (*)    Platelets 138 (*)    All other components within normal limits  URINALYSIS, ROUTINE W REFLEX MICROSCOPIC - Abnormal; Notable for  the following components:   Color, Urine COLORLESS (*)    All other components within normal limits  OCCULT BLOOD X 1 CARD TO LAB, STOOL   ____________________________________________  RADIOLOGY  CT ABDOMEN PELVIS W CONTRAST  Result Date: 04/30/2022 CLINICAL DATA:  Abdominal pain, rectal bleeding EXAM: CT ABDOMEN AND PELVIS WITH CONTRAST TECHNIQUE: Multidetector CT imaging of the abdomen and pelvis was performed using the standard  protocol following bolus administration of intravenous contrast. RADIATION DOSE REDUCTION: This exam was performed according to the departmental dose-optimization program which includes automated exposure control, adjustment of the mA and/or kV according to patient size and/or use of iterative reconstruction technique. CONTRAST:  53m OMNIPAQUE IOHEXOL 300 MG/ML  SOLN COMPARISON:  04/20/2021 FINDINGS: Lower chest: Left hemidiaphragm is elevated. No focal infiltrates are seen in the visualized lower lung fields. Hepatobiliary: No focal abnormalities are seen in the liver. There is no dilation of bile ducts. Gallbladder is unremarkable. Pancreas: No focal abnormalities are seen. Spleen: Unremarkable. Adrenals/Urinary Tract: There is mild hyperplasia of left adrenal with no significant interval change. There is no hydronephrosis. There are no renal or ureteral stones. There is 11 mm fluid density structure in the midportion of left kidney suggesting simple cyst. Urinary bladder is unremarkable. Stomach/Bowel: Stomach is not distended. Small bowel loops are not dilated. The appendix is not dilated. There is no significant wall thickening in colon. Moderate to large amount of stool is seen in colon. A few diverticula are seen in the colon without signs of focal acute diverticulitis. There is large amount of stool in rectum. Transverse diameter of the rectum measures 6.9 cm. There is minimal stranding in the fat planes in the perirectal region. Vascular/Lymphatic: Scattered arterial calcifications are seen. Reproductive: Prostate is enlarged projecting into the base of the bladder. There are scattered coarse calcifications in prostate. Other: There is no ascites or pneumoperitoneum. Small umbilical hernia containing fat is seen. Musculoskeletal: No acute findings are seen. IMPRESSION: There is no evidence of intestinal obstruction or pneumoperitoneum. Appendix is not dilated. There is no hydronephrosis. Large amount of  stool is seen in rectum suggesting fecal impaction. There is minimal stranding in the perirectal fat planes suggesting possible early stercoral colitis. Please correlate with clinical findings. Scattered diverticula are seen in colon without signs of focal acute diverticulitis. Other findings as described in the body of the report. Electronically Signed   By: PElmer PickerM.D.   On: 04/30/2022 12:52    ____________________________________________   PROCEDURES  Procedure(s) performed:   Procedures  None  ____________________________________________   INITIAL IMPRESSION / ASSESSMENT AND PLAN / ED COURSE  Pertinent labs & imaging results that were available during my care of the patient were reviewed by me and considered in my medical decision making (see chart for details).   This patient is Presenting for Evaluation of rectal bleeding, which does require a range of treatment options, and is a complaint that involves a high risk of morbidity and mortality.  The Differential Diagnoses include hemorrhoids, fissure, diverticulitis, diverticular bleeding, etc.  Critical Interventions-    Medications  iohexol (OMNIPAQUE) 300 MG/ML solution 100 mL (80 mLs Intravenous Contrast Given 04/30/22 1228)    Reassessment after intervention: No heavy bleeding or fecal impaction on DRE.   I did obtain Additional Historical Information from wife at bedside.   I decided to review pertinent External Data, and in summary no recent  ED visits for similar.   Clinical Laboratory Tests Ordered, included: Negative.  Mild anemia 12.4.  No acute kidney injury.   Radiologic Tests Ordered, included CT abdomen/pelvis. I independently interpreted the images and agree with radiology interpretation.   Cardiac Monitor Tracing which shows NSR   Social Determinants of Health Risk patient is not an active smoker.   Medical Decision Making: Summary:  Patient presents emergency department with bright red  blood per rectum with some constipation and discomfort.  On exam I do appreciate some hemorrhoids which may be the source of bleeding.  He does have some mild left lower quadrant tenderness on exam although no peritonitis.  Plan for CT to further evaluate for possibility of diverticulitis.  Low suspicion for diverticular bleed.  He is hemodynamically stable.  Hemoglobin has not significantly downtrending and he is not anticoagulated.  Overall looks very well.  Reevaluation with update and discussion with family at bedside.  On exam patient does not have fecal impaction manually disimpact.  Offered enema trial here but family advises they have been in the home and will try that there.  Plan for bowel regimen at treatment hemorrhoid which I have seen on exam.  Plan for close PCP follow-up regarding his ED presentation and HTN mgmt.   Considered admission but patient's not requiring blood transfusion or surgical management.  Plan for bowel regimen at home along with PCP follow-up for hypertension.   Disposition: discharge  ____________________________________________  FINAL CLINICAL IMPRESSION(S) / ED DIAGNOSES  Final diagnoses:  Rectal bleed  Constipation, unspecified constipation type  External hemorrhoids     NEW OUTPATIENT MEDICATIONS STARTED DURING THIS VISIT:  Discharge Medication List as of 04/30/2022  1:46 PM     START taking these medications   Details  hydrocortisone (ANUSOL-HC) 2.5 % rectal cream Place 1 Application rectally 2 (two) times daily., Starting Wed 04/30/2022, Normal    senna-docusate (SENOKOT-S) 8.6-50 MG tablet Take 1 tablet by mouth at bedtime as needed for mild constipation., Starting Wed 04/30/2022, Normal        Note:  This document was prepared using Dragon voice recognition software and may include unintentional dictation errors.  Nanda Quinton, MD, [facility]    Bethann Qualley, Wonda Olds, MD 05/01/22 [phone]

## 2022-05-14 ENCOUNTER — Telehealth: Payer: Self-pay | Admitting: *Deleted

## 2022-05-14 NOTE — Telephone Encounter (Signed)
Called patient and informed patient that his handicap placard form is ready for pick. Form will be at the front desk

## 2022-05-27 ENCOUNTER — Telehealth: Payer: Medicare HMO | Admitting: Pharmacy Technician

## 2022-05-29 ENCOUNTER — Other Ambulatory Visit: Payer: Self-pay

## 2022-05-29 NOTE — Patient Outreach (Signed)
[facility]) Care Management  05/29/2022  Javier [name] Oct 10, 1940 [id number]   RN CM closing case:  pt will be followed for care coordination by [place] [facility].  [name], RN, MSN [facility] Care Management Care Management Coordinator Direct Line [phone] Toll Free: [phone]  Fax: [phone]

## 2022-06-01 ENCOUNTER — Other Ambulatory Visit: Payer: Self-pay | Admitting: Nurse Practitioner

## 2022-06-09 ENCOUNTER — Encounter: Payer: Self-pay | Admitting: Emergency Medicine

## 2022-06-09 ENCOUNTER — Ambulatory Visit (INDEPENDENT_AMBULATORY_CARE_PROVIDER_SITE_OTHER): Payer: Medicare HMO

## 2022-06-09 ENCOUNTER — Ambulatory Visit (INDEPENDENT_AMBULATORY_CARE_PROVIDER_SITE_OTHER): Payer: Medicare HMO | Admitting: Emergency Medicine

## 2022-06-09 VITALS — BP 120/60 | HR 87 | Temp 98.0°F | Ht 71.0 in | Wt 151.0 lb

## 2022-06-09 DIAGNOSIS — R1011 Right upper quadrant pain: Secondary | ICD-10-CM

## 2022-06-09 DIAGNOSIS — I10 Essential (primary) hypertension: Secondary | ICD-10-CM

## 2022-06-09 DIAGNOSIS — Z23 Encounter for immunization: Secondary | ICD-10-CM

## 2022-06-09 DIAGNOSIS — K5790 Diverticulosis of intestine, part unspecified, without perforation or abscess without bleeding: Secondary | ICD-10-CM

## 2022-06-09 DIAGNOSIS — R109 Unspecified abdominal pain: Secondary | ICD-10-CM | POA: Diagnosis not present

## 2022-06-09 LAB — COMPREHENSIVE METABOLIC PANEL
ALT: 8 U/L (ref 0–53)
AST: 12 U/L (ref 0–37)
Albumin: 4 g/dL (ref 3.5–5.2)
Alkaline Phosphatase: 52 U/L (ref 39–117)
BUN: 14 mg/dL (ref 6–23)
CO2: 29 mEq/L (ref 19–32)
Calcium: 9.8 mg/dL (ref 8.4–10.5)
Chloride: 106 mEq/L (ref 96–112)
Creatinine, Ser: 1.04 mg/dL (ref 0.40–1.50)
GFR: 67.41 mL/min (ref >60.00)
Glucose, Bld: 89 mg/dL (ref 70–99)
Potassium: 3.9 mEq/L (ref 3.5–5.1)
Sodium: 141 mEq/L (ref 135–145)
Total Bilirubin: 0.5 mg/dL (ref 0.2–1.2)
Total Protein: 6.6 g/dL (ref 6.0–8.3)

## 2022-06-09 LAB — URINALYSIS
Bilirubin Urine: NEGATIVE
Hgb urine dipstick: NEGATIVE
Ketones, ur: NEGATIVE
Leukocytes,Ua: NEGATIVE
Nitrite: NEGATIVE
Specific Gravity, Urine: 1.02 (ref 1.000–1.030)
Urine Glucose: NEGATIVE
Urobilinogen, UA: 1 (ref 0.0–1.0)
pH: 6 (ref 5.0–8.0)

## 2022-06-09 LAB — CBC WITH DIFFERENTIAL/PLATELET
Basophils Absolute: 0 10*3/uL (ref 0.0–0.1)
Basophils Relative: 0.6 % (ref 0.0–3.0)
Eosinophils Absolute: 0 10*3/uL (ref 0.0–0.7)
Eosinophils Relative: 0.5 % (ref 0.0–5.0)
HCT: 38.8 % — ABNORMAL LOW (ref 39.0–52.0)
Hemoglobin: 12.7 g/dL — ABNORMAL LOW (ref 13.0–17.0)
Lymphocytes Relative: 46 % (ref 12.0–46.0)
Lymphs Abs: 2.6 10*3/uL (ref 0.7–4.0)
MCHC: 32.7 g/dL (ref 30.0–36.0)
MCV: 92.5 fl (ref 78.0–100.0)
Monocytes Absolute: 0.4 10*3/uL (ref 0.1–1.0)
Monocytes Relative: 7.7 % (ref 3.0–12.0)
Neutro Abs: 2.5 10*3/uL (ref 1.4–7.7)
Neutrophils Relative %: 45.2 % (ref 43.0–77.0)
Platelets: 137 10*3/uL — ABNORMAL LOW (ref 150.0–400.0)
RBC: 4.19 Mil/uL — ABNORMAL LOW (ref 4.22–5.81)
RDW: 14.2 % (ref 11.5–15.5)
WBC: 5.6 10*3/uL (ref 4.0–10.5)

## 2022-06-09 LAB — LIPASE: Lipase: 14 U/L (ref 11.0–59.0)

## 2022-06-09 NOTE — Progress Notes (Signed)
Javier [name] 81 y.o.   Chief Complaint  Patient presents with   Abdominal Pain    A lot of mucus, Color: Clear    HISTORY OF PRESENT ILLNESS: This is a 81 y.o. male complaining of intermittent right-sided abdominal pain for the last couple weeks.  Still able to eat and drink.  Denies nausea or vomiting.  Denies fever or chills.  Still moving bowels okay.  Denies rectal bleeding or melena.  Spitting up a lot of clear phlegm.  No other associated symptoms. No other complaints or medical concerns today.  Abdominal Pain Pertinent negatives include no fever.    Prior to Admission medications   Medication Sig Start Date End Date Taking? Authorizing Provider  aspirin EC 81 MG tablet Take 81 mg by mouth daily.   Yes [provider]  busPIRone (BUSPAR) 5 MG tablet Take 1 tablet  3 x /day  for Nervousness & Chronic Anxiety 04/13/22  Yes Unk Pinto, MD  Cholecalciferol (VITAMIN D3) 5000 units TABS Take 5,000 Units by mouth daily.   Yes [provider]  fluticasone (FLONASE) 50 MCG/ACT nasal spray Place 2 sprays into both nostrils daily. 12/18/21 12/18/22 Yes Alycia Rossetti, NP  hydrocortisone (ANUSOL-HC) 2.5 % rectal cream Place 1 Application rectally 2 (two) times daily. 04/30/22  Yes Long, Wonda Olds, MD  lipase/protease/amylase (CREON) 12000-38000 units CPEP capsule Take 12,000 Units by mouth 3 (three) times daily with meals.   Yes [provider]  losartan (COZAAR) 100 MG tablet Take 1 tablet by mouth once daily 06/02/22  Yes Cranford, Tonya, NP  QUEtiapine (SEROQUEL) 25 MG tablet Take 1 tablet (25 mg total) by mouth at bedtime. 12/18/21  Yes Alycia Rossetti, NP  rosuvastatin (CRESTOR) 20 MG tablet Take  1 tablet  Daily  for Cholesterol 09/11/21  Yes Unk Pinto, MD  senna-docusate (SENOKOT-S) 8.6-50 MG tablet Take 1 tablet by mouth at bedtime as needed for mild constipation. 04/30/22  Yes Long, Wonda Olds, MD  sertraline (ZOLOFT) 50 MG tablet Take  1 tablet   Daily  for Mood  / Chronic Anxiety 09/10/21  Yes Unk Pinto, MD  tamsulosin (FLOMAX) 0.4 MG CAPS capsule Take 1 capsule (0.4 mg total) by mouth daily after supper. For prostate. 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 in 2004    Patient Active Problem List   Diagnosis Date Noted   Memory loss 10/29/2021   Pancreatic insufficiency 08/20/2021   TIA (transient ischemic attack) 08/17/2021   Left-sided weakness 08/10/2021   Generalized weakness 08/09/2021   Personal history of CLL (chronic lymphocytic leukemia) 08/09/2021   Pancytopenia ([place]) 08/09/2021   Fall at home, initial encounter 08/09/2021   Influenza A 08/09/2021   At high risk for falls 04/29/2021   Vascular dementia ([place]) 03/28/2021   Sleeping difficulty 03/28/2021   Agitation 03/28/2021   History of lacunar cerebrovascular accident 01/09/2021   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   BPH with obstruction/lower urinary tract symptoms 01/09/2021   First degree AV block 01/09/2021   Gait abnormality 12/20/2020   Confusion 12/20/2020   BMI 26.0-26.9,adult 03/06/2020   Gastric AVM 03/06/2020   History of colon polyps 03/06/2020   Aortic atherosclerosis ([place]) - per CT 04/2016 08/23/2019   Memory changes 05/25/2019   Counseling regarding  advance care planning and goals of care 01/24/2019   Anxiety 02/08/2018   CLL (chronic lymphocytic leukemia) ([place]) 11/01/2016   GERD  02/09/2015   Hyperlipidemia    Hypertension    Vitamin D deficiency    Diverticulosis     Past Medical History:  Diagnosis Date   Diverticulosis    Hyperlipidemia    Hypertension    Memory loss    Prediabetes    Vitamin D deficiency     Past Surgical History:  Procedure Laterality Date   NO PAST SURGERIES      Social History    Socioeconomic History   Marital status: Married    Spouse name: Not on file   Number of children: 5   Years of education: 9th grade   Highest education level: Not on file  Occupational History   Occupation: retired  Tobacco Use   Smoking status: Former    Types: Cigarettes    Quit date: 01/17/2003    Years since quitting: 19.4   Smokeless tobacco: Never  Vaping Use   Vaping Use: Never used  Substance and Sexual Activity   Alcohol use: Not Currently    Comment: very rare   Drug use: No   Sexual activity: Not Currently  Other Topics Concern   Not on file  Social History Narrative   Lives at home with his wife.   Right-handed.   At most, one cup caffeine per day.    Social Determinants of Health   Financial Resource Strain: Not on file  Food Insecurity: No Food Insecurity (09/11/2021)   Hunger Vital Sign    Worried About Running Out of Food in the Last Year: Never true    Ran Out of Food in the Last Year: Never true  Transportation Needs: No Transportation Needs (04/29/2022)   PRAPARE - Hydrologist (Medical): No    Lack of Transportation (Non-Medical): No  Physical Activity: Not on file  Stress: Not on file  Social Connections: Not on file  Intimate Partner Violence: Not on file    Family History  Problem Relation Age of Onset   Stroke Mother    Early death Father        Farm/tractor accident   Diabetes Brother    Cirrhosis Brother    Colon cancer Paternal Grandfather      Review of Systems  Constitutional: Negative.  Negative for chills and fever.  HENT: Negative.  Negative for congestion and sore throat.   Respiratory: Negative.  Negative for cough and shortness of breath.   Cardiovascular: Negative.  Negative for chest pain and palpitations.  Gastrointestinal:  Positive for abdominal pain.  Skin: Negative.  Negative for rash.  All other systems reviewed and are negative. Today's Vitals   06/09/22 1446  BP: 120/60  Pulse:  87  Temp: 98 F (36.7 C)  TempSrc: Oral  SpO2: 97%  Weight: 151 lb (68.5 kg)  Height: '5\' 11"'$  (1.803 m)   Body mass index is 21.06 kg/m. Wt Readings from Last 3 Encounters:  06/09/22 151 lb (68.5 kg)  04/30/22 152 lb (68.9 kg)  03/05/22 152 lb 4 oz (69.1 kg)     Physical Exam Vitals reviewed.  Constitutional:      Appearance: He is well-developed.  HENT:     Head: Normocephalic.     Mouth/Throat:     Mouth: Mucous membranes are moist.     Pharynx: Oropharynx is clear.  Eyes:     Extraocular  Movements: Extraocular movements intact.     Pupils: Pupils are equal, round, and reactive to light.  Cardiovascular:     Rate and Rhythm: Normal rate and regular rhythm.     Pulses: Normal pulses.     Heart sounds: Normal heart sounds.  Pulmonary:     Effort: Pulmonary effort is normal.     Breath sounds: Normal breath sounds.  Abdominal:     General: There is no distension.     Palpations: Abdomen is soft.     Tenderness: There is no abdominal tenderness.  Musculoskeletal:     Cervical back: No tenderness.  Lymphadenopathy:     Cervical: No cervical adenopathy.  Skin:    General: Skin is warm and dry.     Capillary Refill: Capillary refill takes less than 2 seconds.  Neurological:     General: No focal deficit present.     Mental Status: He is alert and oriented to person, place, and time.  Psychiatric:        Mood and Affect: Mood normal.        Behavior: Behavior normal.   DG ABD ACUTE 2+V W 1V CHEST  Result Date: 06/09/2022 CLINICAL DATA:  Right-sided abdominal pain. EXAM: DG ABDOMEN ACUTE WITH 1 VIEW CHEST COMPARISON:  Chest x-ray 08/09/2021. CT abdomen and pelvis 04/30/2022. FINDINGS: There is no evidence of dilated bowel loops or free intraperitoneal air. Stool burden is moderate. Phleboliths are noted in the pelvis, unchanged. No radiopaque calculi overlying the kidneys. Heart size and mediastinal contours are within normal limits. Both lungs are clear. IMPRESSION:  Negative abdominal radiographs.  No acute cardiopulmonary disease. Electronically Signed   By: Ronney Asters M.D.   On: 06/09/2022 15:21    ASSESSMENT & PLAN: A total of 50 minutes was spent with the patient and counseling/coordination of care regarding preparing for this visit, review of most recent office visit notes, review of multiple chronic medical problems and their management, review of all medications, differential diagnosis of abdominal pain and need for blood work, review of x-rays done to do, education on nutrition, prognosis, documentation, and need for follow-up.  Problem List Items Addressed This Visit       Cardiovascular and Mediastinum   Hypertension    Well-controlled hypertension. Continue losartan 100 mg daily. BP Readings from Last 3 Encounters:  06/09/22 120/60  04/30/22 (!) 204/80  03/05/22 132/70           Digestive   Diverticulosis    No signs of diverticulitis.        Other   Right upper quadrant abdominal pain - Primary    Clinically stable.  No red flag signs or symptoms. Stable vital signs.  Afebrile.  Benign abdominal examination. Differential diagnosis discussed. Needs work-up. Normal x-rays. We will order abdominal ultrasound first.      Relevant Orders   DG ABD ACUTE 2+V W 1V CHEST (Completed)   Urinalysis   CBC with Differential/Platelet   Comprehensive metabolic panel   Lipase   US Abdomen Complete   Other Visit Diagnoses     Flu vaccine need       Relevant Orders   Flu Vaccine QUAD High Dose(Fluad)      Patient Instructions  Abdominal Pain, Adult Many things can cause belly (abdominal) pain. Most times, belly pain is not dangerous. Many cases of belly pain can be watched and treated at home. Sometimes, though, belly pain is serious. Your doctor will try to find the cause of your  belly pain. Follow these instructions at home:  Medicines Take over-the-counter and prescription medicines only as told by your doctor. Do not  take medicines that help you poop (laxatives) unless told by your doctor. General instructions Watch your belly pain for any changes. Drink enough fluid to keep your pee (urine) pale yellow. Keep all follow-up visits as told by your doctor. This is important. Contact a doctor if: Your belly pain changes or gets worse. You are not hungry, or you lose weight without trying. You are having trouble pooping (constipated) or have watery poop (diarrhea) for more than 2-3 days. You have pain when you pee or poop. Your belly pain wakes you up at night. Your pain gets worse with meals, after eating, or with certain foods. You are vomiting and cannot keep anything down. You have a fever. You have blood in your pee. Get help right away if: Your pain does not go away as soon as your doctor says it should. You cannot stop vomiting. Your pain is only in areas of your belly, such as the right side or the left lower part of the belly. You have bloody or black poop, or poop that looks like tar. You have very bad pain, cramping, or bloating in your belly. You have signs of not having enough fluid or water in your body (dehydration), such as: Dark pee, very little pee, or no pee. Cracked lips. Dry mouth. Sunken eyes. Sleepiness. Weakness. You have trouble breathing or chest pain. Summary Many cases of belly pain can be watched and treated at home. Watch your belly pain for any changes. Take over-the-counter and prescription medicines only as told by your doctor. Contact a doctor if your belly pain changes or gets worse. Get help right away if you have very bad pain, cramping, or bloating in your belly. This information is not intended to replace advice given to you by your health care provider. Make sure you discuss any questions you have with your health care provider. Document Revised: 01/24/2019 Document Reviewed: 01/24/2019 Elsevier Patient Education  [place],  MD [facility]

## 2022-06-09 NOTE — Patient Instructions (Signed)
Abdominal Pain, Adult Many things can cause belly (abdominal) pain. Most times, belly pain is not dangerous. Many cases of belly pain can be watched and treated at home. Sometimes, though, belly pain is serious. Your doctor will try to find the cause of your belly pain. Follow these instructions at home:  Medicines Take over-the-counter and prescription medicines only as told by your doctor. Do not take medicines that help you poop (laxatives) unless told by your doctor. General instructions Watch your belly pain for any changes. Drink enough fluid to keep your pee (urine) pale yellow. Keep all follow-up visits as told by your doctor. This is important. Contact a doctor if: Your belly pain changes or gets worse. You are not hungry, or you lose weight without trying. You are having trouble pooping (constipated) or have watery poop (diarrhea) for more than 2-3 days. You have pain when you pee or poop. Your belly pain wakes you up at night. Your pain gets worse with meals, after eating, or with certain foods. You are vomiting and cannot keep anything down. You have a fever. You have blood in your pee. Get help right away if: Your pain does not go away as soon as your doctor says it should. You cannot stop vomiting. Your pain is only in areas of your belly, such as the right side or the left lower part of the belly. You have bloody or black poop, or poop that looks like tar. You have very bad pain, cramping, or bloating in your belly. You have signs of not having enough fluid or water in your body (dehydration), such as: Dark pee, very little pee, or no pee. Cracked lips. Dry mouth. Sunken eyes. Sleepiness. Weakness. You have trouble breathing or chest pain. Summary Many cases of belly pain can be watched and treated at home. Watch your belly pain for any changes. Take over-the-counter and prescription medicines only as told by your doctor. Contact a doctor if your belly pain  changes or gets worse. Get help right away if you have very bad pain, cramping, or bloating in your belly. This information is not intended to replace advice given to you by your health care provider. Make sure you discuss any questions you have with your health care provider. Document Revised: 01/24/2019 Document Reviewed: 01/24/2019 Elsevier Patient Education  2023 Elsevier Inc.  

## 2022-06-09 NOTE — Assessment & Plan Note (Signed)
Clinically stable.  No red flag signs or symptoms. Stable vital signs.  Afebrile.  Benign abdominal examination. Differential diagnosis discussed. Needs work-up. Normal x-rays. We will order abdominal ultrasound first.

## 2022-06-09 NOTE — Assessment & Plan Note (Signed)
No signs of diverticulitis.

## 2022-06-09 NOTE — Assessment & Plan Note (Signed)
Well-controlled hypertension. Continue losartan 100 mg daily. BP Readings from Last 3 Encounters:  06/09/22 120/60  04/30/22 (!) 204/80  03/05/22 132/70

## 2022-06-18 ENCOUNTER — Ambulatory Visit
Admission: RE | Admit: 2022-06-18 | Discharge: 2022-06-18 | Disposition: A | Discharge status: Home / Self Care | Payer: Medicare HMO | Source: Ambulatory Visit | Attending: Emergency Medicine | Admitting: Emergency Medicine

## 2022-06-18 DIAGNOSIS — R1011 Right upper quadrant pain: Secondary | ICD-10-CM

## 2022-06-18 DIAGNOSIS — N281 Cyst of kidney, acquired: Secondary | ICD-10-CM | POA: Diagnosis not present

## 2022-07-14 ENCOUNTER — Other Ambulatory Visit: Payer: Self-pay | Admitting: Internal Medicine

## 2022-07-23 ENCOUNTER — Ambulatory Visit: Payer: Medicare HMO | Admitting: Podiatry

## 2022-07-23 ENCOUNTER — Telehealth: Payer: Self-pay

## 2022-07-23 ENCOUNTER — Ambulatory Visit: Payer: Self-pay

## 2022-07-23 DIAGNOSIS — M19071 Primary osteoarthritis, right ankle and foot: Secondary | ICD-10-CM

## 2022-07-23 DIAGNOSIS — I10 Essential (primary) hypertension: Secondary | ICD-10-CM

## 2022-07-23 NOTE — Telephone Encounter (Signed)
   Telephone encounter was:  Unsuccessful.  07/23/2022 Name: Javier [name] MRN: [id number] DOB: [birth year]  Unsuccessful outbound call made today to assist with:   social connections  Outreach Attempt:  1st Attempt Unable to leave a [name] [name]  [phone] [address], [place], [place] [address] Phone: [phone] Email: [email or web address]

## 2022-07-23 NOTE — Progress Notes (Signed)
Subjective:  Patient ID: Javier [name], male    DOB: Jul 18, 1941,  MRN: [id number]  Chief Complaint  Patient presents with   Foot Pain    Pt stated that at times he will get these cramps in the top of his foot     81 y.o. male presents with the above complaint.  Patient presents with complaint of right midfoot pain.  Patient has been on for quite some time is progressive gotten worse worse with ambulation worse with pressure.  Patient pain scale is dull aching nature 7 out of 10 its gotten worse since the weather change.  He would like to discuss treatment options for this.   Review of Systems: Negative except as noted in the HPI. Denies N/V/F/Ch.  Past Medical History:  Diagnosis Date   Diverticulosis    Hyperlipidemia    Hypertension    Memory loss    Prediabetes    Vitamin D deficiency     Current Outpatient Medications:    aspirin EC 81 MG tablet, Take 81 mg by mouth daily., Disp: , Rfl:    busPIRone (BUSPAR) 5 MG tablet, Take 1 tablet  3 x /day  for Nervousness & Chronic Anxiety, Disp: 270 tablet, Rfl: 1   Cholecalciferol (VITAMIN D3) 5000 units TABS, Take 5,000 Units by mouth daily., Disp: , Rfl:    fluticasone (FLONASE) 50 MCG/ACT nasal spray, Place 2 sprays into both nostrils daily., Disp: 16 g, Rfl: 2   hydrocortisone (ANUSOL-HC) 2.5 % rectal cream, Place 1 Application rectally 2 (two) times daily., Disp: 30 g, Rfl: 0   lipase/protease/amylase (CREON) 12000-38000 units CPEP capsule, Take 12,000 Units by mouth 3 (three) times daily with meals., Disp: , Rfl:    losartan (COZAAR) 100 MG tablet, Take 1 tablet by mouth once daily, Disp: 90 tablet, Rfl: 0   QUEtiapine (SEROQUEL) 25 MG tablet, Take 1 tablet (25 mg total) by mouth at bedtime., Disp: 90 tablet, Rfl: 3   rosuvastatin (CRESTOR) 20 MG tablet, TAKE 1 TABLET EVERY DAY FOR CHOLESTEROL, Disp: 90 tablet, Rfl: 10   senna-docusate (SENOKOT-S) 8.6-50 MG tablet, Take 1 tablet by mouth at bedtime as needed for mild  constipation., Disp: 100 tablet, Rfl: 0   sertraline (ZOLOFT) 50 MG tablet, Take  1 tablet  Daily  for Mood  / Chronic Anxiety, Disp: 90 tablet, Rfl: 3   tamsulosin (FLOMAX) 0.4 MG CAPS capsule, Take 1 capsule (0.4 mg total) by mouth daily after supper. 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 There is no height or weight on file to calculate BMI. Constitutional Well developed. Well nourished.  Vascular Dorsalis pedis pulses palpable bilaterally. Posterior tibial pulses palpable bilaterally. Capillary refill normal to all digits.  No cyanosis or clubbing noted. Pedal hair growth normal.  Neurologic Normal speech. Oriented to person, place, and time. Epicritic sensation to light touch grossly present bilaterally.  Dermatologic Nails well groomed and normal in appearance. No open wounds. No skin lesions.  Orthopedic: Pain on palpation of right medial midfoot.  Pain around the first tarsometatarsal joint no pain at the Lisfranc interval.  No extensor or flexor tendinitis noted.   Radiographs: None Assessment:   1. Arthritis of right midfoot    Plan:  Patient was evaluated and treated and all questions answered.  Right midfoot arthritis -All questions and concerns were discussed with the patient in extensive detail -Given the amount of pain that he is experiencing benefit from a steroid injection help decrease for inflammatory component associate with pain.  Patient agrees with the plan like to proceed with steroid injection. -A steroid injection was  performed at rigght medial foot using 1% plain Lidocaine and 10 mg of Kenalog. This was well tolerated.   No follow-ups on file.

## 2022-07-23 NOTE — Patient Instructions (Signed)
Visit Information  Thank you for taking time to visit with me today. Please don't hesitate to contact me if I can be of assistance to you.   Following are the goals we discussed today:   Goals Addressed             This Visit's Progress    Community resources       Care Coordination Interventions: Care Guide referral for community resources to help with increased socialization Assessed social determinant of health barriers Discussed Senior resources of [place] and provided contact number [phone] Discussed [place] and encouraged to contact insurance to find out more about this benefit.       Our next appointment is by telephone on 08/25/22 at 2:30 pm  Please call the care guide team at [phone] if you need to cancel or reschedule your appointment.   If you are experiencing a Mental Health or [place] or need someone to talk to, please call the Suicide and Crisis Lifeline: 988  The patient verbalized understanding of instructions, educational materials, and care plan provided today and DECLINED offer to receive copy of patient instructions, educational materials, and care plan.   Thea Silversmith, RN, MSN, BSN, [place] Coordinator [phone]

## 2022-07-23 NOTE — Patient Outreach (Signed)
  Care Coordination   Initial Visit Note   07/23/2022 Name: Javier [name] MRN: [id number] DOB: 10-15-1940  Javier [name] is a 81 y.o. year old male who sees Sagardia, Javier Bloomer, MD for primary care. I spoke with  Javier [name] by phone today.  What matters to the patients health and wellness today?  Spoke with patent who reports he is doing ok. He request care coordinator to speak with his wife. Per Javier [name], patient has limited mobility, but walks with walker. She manages his medications. She denies any disease management issues at this time. Javier [name] expresses that her primary concern today is in finding programs to increase socialization for patient.    Goals Addressed             This Visit's Progress    Community resources       Care Coordination Interventions: Care Guide referral for community resources to help with increased socialization Assessed social determinant of health barriers Discussed [facility] resources of [place] and provided contact number [phone] Discussed [place] and encouraged to contact insurance to find out more about this benefit.       SDOH assessments and interventions completed:  Yes  SDOH Interventions Today    Flowsheet Row Most Recent Value  SDOH Interventions   Food Insecurity Interventions Intervention Not Indicated  Housing Interventions Intervention Not Indicated  Transportation Interventions Intervention Not Indicated  Utilities Interventions Intervention Not Indicated     Care Coordination Interventions Activated:  Yes  Care Coordination Interventions:  Yes, provided   Follow up plan: Follow up call scheduled for 08/25/22    Encounter Outcome:  Pt. Visit Completed   Javier Silversmith, RN, MSN, BSN, [place] Coordinator [phone]

## 2022-07-24 ENCOUNTER — Telehealth: Payer: Self-pay

## 2022-07-24 NOTE — Telephone Encounter (Signed)
   Telephone encounter was:  Unsuccessful.  07/24/2022 Name: Javier [name] MRN: [id number] DOB: Oct 03, 1940  Unsuccessful outbound call made today to assist with:   socialization  Outreach Attempt:  2nd Attempt  Unable to [place], [place] Management  [phone] [address], [place], [place] [address] Phone: [phone] Email: [email or web address]

## 2022-07-28 ENCOUNTER — Telehealth: Payer: Self-pay

## 2022-07-28 NOTE — Telephone Encounter (Signed)
   Telephone encounter was:  Unsuccessful.  07/28/2022 Name: Javier [name] MRN: [id number] DOB: 1941/01/19  Unsuccessful outbound call made today to assist with:   social connections  Outreach Attempt:  3rd Attempt.  Referral closed unable to contact patient.  Unable to leave a message   [place], [place] Management  [phone] [address], [place], [place] [address] Phone: [phone] Email: [email or web address]

## 2022-08-01 ENCOUNTER — Telehealth: Payer: Self-pay

## 2022-08-01 NOTE — Patient Outreach (Signed)
  Care Coordination   Follow Up Visit Note   08/01/2022 Name: BRIANA NEWMAN MRN: [id number] DOB: 08-Jun-1941  Marlou Starks Liller is a 81 y.o. year old male who sees Sagardia, Ines Bloomer, MD for primary care. I spoke with spouse(dpr), Renie Ora by phone today.  What matters to the patients health and wellness today?  Mrs. Fallert called to request someone come to her house to check on patient. She expresses that he has become more anxious, gets hot and is going outside more at night and has to have the windows up and is requiring more medication. RN informed Mrs Cervi that Care Coordination team does not make home visits and instructed her to contact primary care provider as patient may need to be seen by provider. Wife also request the contact name and number of neurologist that patient has seen in the past. Mrs. Bertram states she is going to get in contact with primary care provider.   Goals Addressed             This Visit's Progress    Community resources       Care Coordination Interventions: Encouraged spouse to contact primary care provider with health questions/concerns or worsening condition  Provided contact number to neurologist Encouraged spouse to contact RN Care Manager as needed for care coordination needs Primary care provider updated      SDOH assessments and interventions completed:  No  Care Coordination Interventions Activated:  No  Care Coordination Interventions:  No, not indicated   Follow up plan:  follow up as previously scheduled    Encounter Outcome:  Pt. Visit Completed   Thea Silversmith, RN, MSN, BSN, [place] Coordinator [phone]

## 2022-08-01 NOTE — Patient Outreach (Signed)
  Care Coordination   08/01/2022 Name: BRAE GARTMAN MRN: [id number] DOB: 15-Feb-1941   Care Coordination Outreach Attempts:  Care Coordinator received message from case manager, Jon Billings, RN that patient's wife left a message requesting a return call. RNCM called, but no answer. HIPAA compliant message left.  Follow Up Plan: Await return call.  Encounter Outcome:  No Answer  Care Coordination Interventions Activated:  No   Care Coordination Interventions:  No, not indicated    Thea Silversmith, RN, MSN, BSN, [place] Coordinator [phone]

## 2022-08-08 ENCOUNTER — Telehealth: Payer: Self-pay | Admitting: Emergency Medicine

## 2022-08-11 ENCOUNTER — Telehealth: Payer: Self-pay

## 2022-08-11 NOTE — Patient Outreach (Signed)
  Care Coordination   08/11/2022 Name: Javier [name] MRN: [id number] DOB: 15-Jan-1941   Care Coordination Outreach Attempts:  An unsuccessful telephone outreach was attempted today to offer the patient information about available care coordination services as a benefit of their health plan. Care Coordination. RNCM called to follow up on a triage line regarding requesting home health. Patient was advised to contact provider office.  Follow Up Plan: await return call. Continue to follow.  Encounter Outcome:  No Answer  Care Coordination Interventions Activated:  No   Care Coordination Interventions:  No, not indicated    Thea Silversmith, RN, MSN, BSN, CCM Care Coordinator [phone]

## 2022-08-12 ENCOUNTER — Ambulatory Visit (INDEPENDENT_AMBULATORY_CARE_PROVIDER_SITE_OTHER): Payer: Medicare HMO | Admitting: Emergency Medicine

## 2022-08-12 ENCOUNTER — Encounter: Payer: Self-pay | Admitting: Emergency Medicine

## 2022-08-12 VITALS — BP 136/84 | HR 61 | Temp 97.9°F | Ht 71.0 in | Wt 150.1 lb

## 2022-08-12 DIAGNOSIS — D61818 Other pancytopenia: Secondary | ICD-10-CM | POA: Diagnosis not present

## 2022-08-12 DIAGNOSIS — C9110 Chronic lymphocytic leukemia of B-cell type not having achieved remission: Secondary | ICD-10-CM

## 2022-08-12 DIAGNOSIS — R269 Unspecified abnormalities of gait and mobility: Secondary | ICD-10-CM

## 2022-08-12 DIAGNOSIS — I700 Atherosclerosis of aorta: Secondary | ICD-10-CM

## 2022-08-12 DIAGNOSIS — F01B18 Vascular dementia, moderate, with other behavioral disturbance: Secondary | ICD-10-CM | POA: Diagnosis not present

## 2022-08-12 DIAGNOSIS — N138 Other obstructive and reflux uropathy: Secondary | ICD-10-CM | POA: Diagnosis not present

## 2022-08-12 DIAGNOSIS — I10 Essential (primary) hypertension: Secondary | ICD-10-CM | POA: Diagnosis not present

## 2022-08-12 DIAGNOSIS — N401 Enlarged prostate with lower urinary tract symptoms: Secondary | ICD-10-CM | POA: Diagnosis not present

## 2022-08-12 NOTE — Assessment & Plan Note (Signed)
Stable.  No concerns. °

## 2022-08-12 NOTE — Assessment & Plan Note (Signed)
Stable.  Continue Zoloft 50 mg daily and Seroquel 25 mg at bedtime. Continue using BuSpar 5 mg 3 times a day as needed.

## 2022-08-12 NOTE — Assessment & Plan Note (Signed)
Stable.  Continue rosuvastatin 20 mg daily. ?

## 2022-08-12 NOTE — Assessment & Plan Note (Signed)
Well-controlled hypertension. Continue losartan 100 mg daily BP Readings from Last 3 Encounters:  08/12/22 136/84  06/09/22 120/60  04/30/22 (!) 204/80

## 2022-08-12 NOTE — Progress Notes (Signed)
Javier [name] 81 y.o.   Chief Complaint  Patient presents with   Follow-up    F/u patient spouse wants Home Health referral, unsure of needs, maybe nurse aide to come in     HISTORY OF PRESENT ILLNESS: This is a 81 y.o. male with history of hypertension and vascular dementia accompanied by wife who is requesting home health care. Overall patient doing well.  Ambulates with a walker at home.  No recent falls. Has no complaints or any other medical concerns today.  HPI   Prior to Admission medications   Medication Sig Start Date End Date Taking? Authorizing Provider  aspirin EC 81 MG tablet Take 81 mg by mouth daily.   Yes [provider]  busPIRone (BUSPAR) 5 MG tablet Take 1 tablet  3 x /day  for Nervousness & Chronic Anxiety 04/13/22  Yes Unk Pinto, MD  Cholecalciferol (VITAMIN D3) 5000 units TABS Take 5,000 Units by mouth daily.   Yes [provider]  fluticasone (FLONASE) 50 MCG/ACT nasal spray Place 2 sprays into both nostrils daily. 12/18/21 12/18/22 Yes Alycia Rossetti, NP  hydrocortisone (ANUSOL-HC) 2.5 % rectal cream Place 1 Application rectally 2 (two) times daily. 04/30/22  Yes Long, Wonda Olds, MD  lipase/protease/amylase (CREON) 12000-38000 units CPEP capsule Take 12,000 Units by mouth 3 (three) times daily with meals.   Yes [provider]  losartan (COZAAR) 100 MG tablet Take 1 tablet by mouth once daily 06/02/22  Yes Cranford, Tonya, NP  QUEtiapine (SEROQUEL) 25 MG tablet Take 1 tablet (25 mg total) by mouth at bedtime. 12/18/21  Yes Alycia Rossetti, NP  rosuvastatin (CRESTOR) 20 MG tablet TAKE 1 TABLET EVERY DAY FOR CHOLESTEROL 07/14/22  Yes Cranford, Tonya, NP  senna-docusate (SENOKOT-S) 8.6-50 MG tablet Take 1 tablet by mouth at bedtime as needed for mild constipation. 04/30/22  Yes Long, Wonda Olds, MD  sertraline (ZOLOFT) 50 MG tablet Take  1 tablet  Daily  for Mood  / Chronic Anxiety 09/10/21  Yes Unk Pinto, MD  tamsulosin (FLOMAX)  0.4 MG CAPS capsule Take 1 capsule (0.4 mg total) by mouth daily after supper. For prostate. 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 in 2004    Patient Active Problem List   Diagnosis Date Noted   Right upper quadrant abdominal pain 06/09/2022   Memory loss 10/29/2021   Pancreatic insufficiency 08/20/2021   Left-sided weakness 08/10/2021   Generalized weakness 08/09/2021   Personal history of CLL (chronic lymphocytic leukemia) 08/09/2021   Pancytopenia ([place]) 08/09/2021   Fall at home, initial encounter 08/09/2021   At high risk for falls 04/29/2021   Vascular dementia ([place]) 03/28/2021   Sleeping difficulty 03/28/2021   History of lacunar cerebrovascular accident 01/09/2021   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   BPH with obstruction/lower urinary tract symptoms 01/09/2021   First degree AV block 01/09/2021   Gait abnormality 12/20/2020   BMI 26.0-26.9,adult 03/06/2020   Gastric AVM 03/06/2020   History of colon polyps 03/06/2020   Aortic atherosclerosis ([place]) - per CT 04/2016 08/23/2019   Memory changes 05/25/2019   Anxiety 02/08/2018   CLL (chronic lymphocytic leukemia) ([place]) 11/01/2016   GERD  02/09/2015   Hyperlipidemia    Hypertension    Vitamin D deficiency    Diverticulosis  Past Medical History:  Diagnosis Date   Diverticulosis    Hyperlipidemia    Hypertension    Memory loss    Prediabetes    Vitamin D deficiency     Past Surgical History:  Procedure Laterality Date   NO PAST SURGERIES      Social History   Socioeconomic History   Marital status: Married    Spouse name: Not on file   Number of children: 5   Years of education: 9th grade   Highest education level: Not on file  Occupational History   Occupation: retired  Tobacco Use    Smoking status: Former    Types: Cigarettes    Quit date: 01/17/2003    Years since quitting: 19.5   Smokeless tobacco: Never  Vaping Use   Vaping Use: Never used  Substance and Sexual Activity   Alcohol use: Not Currently    Comment: very rare   Drug use: No   Sexual activity: Not Currently  Other Topics Concern   Not on file  Social History Narrative   Lives at home with his wife.   Right-handed.   At most, one cup caffeine per day.    Social Determinants of Health   Financial Resource Strain: Not on file  Food Insecurity: No Food Insecurity (07/23/2022)   Hunger Vital Sign    Worried About Running Out of Food in the Last Year: Never true    Ran Out of Food in the Last Year: Never true  Transportation Needs: No Transportation Needs (07/23/2022)   PRAPARE - Hydrologist (Medical): No    Lack of Transportation (Non-Medical): No  Physical Activity: Not on file  Stress: Not on file  Social Connections: Not on file  Intimate Partner Violence: Not on file    Family History  Problem Relation Age of Onset   Stroke Mother    Early death Father        Farm/tractor accident   Diabetes Brother    Cirrhosis Brother    Colon cancer Paternal Grandfather      Review of Systems  Constitutional: Negative.  Negative for chills and fever.  HENT: Negative.  Negative for congestion and sore throat.   Respiratory: Negative.  Negative for cough and shortness of breath.   Cardiovascular: Negative.  Negative for chest pain and palpitations.  Gastrointestinal: Negative.  Negative for abdominal pain, nausea and vomiting.  Genitourinary: Negative.  Negative for dysuria and hematuria.  Skin: Negative.  Negative for rash.  Neurological: Negative.  Negative for dizziness and headaches.  All other systems reviewed and are negative.   Today's Vitals   08/12/22 1456  BP: 136/84  Pulse: 61  Temp: 97.9 F (36.6 C)  TempSrc: Oral  SpO2: 97%  Weight: 150 lb 2  oz (68.1 kg)  Height: '5\' 11"'$  (1.803 m)   Body mass index is 20.94 kg/m. Wt Readings from Last 3 Encounters:  08/12/22 150 lb 2 oz (68.1 kg)  06/09/22 151 lb (68.5 kg)  04/30/22 152 lb (68.9 kg)    Physical Exam Vitals reviewed.  Constitutional:      Appearance: Normal appearance.  HENT:     Head: Normocephalic.     Mouth/Throat:     Mouth: Mucous membranes are moist.     Pharynx: Oropharynx is clear.  Eyes:     Extraocular Movements: Extraocular movements intact.     Conjunctiva/sclera: Conjunctivae normal.     Pupils: Pupils are equal, round, and reactive  to light.  Cardiovascular:     Rate and Rhythm: Normal rate and regular rhythm.     Pulses: Normal pulses.     Heart sounds: Murmur heard.  Pulmonary:     Effort: Pulmonary effort is normal.     Breath sounds: Normal breath sounds.  Abdominal:     Palpations: Abdomen is soft.     Tenderness: There is no abdominal tenderness.  Musculoskeletal:     Cervical back: No tenderness.  Lymphadenopathy:     Cervical: No cervical adenopathy.  Skin:    General: Skin is warm and dry.  Neurological:     Mental Status: He is alert and oriented to person, place, and time.  Psychiatric:        Mood and Affect: Mood normal.        Behavior: Behavior normal.      ASSESSMENT & PLAN: A total of 48 minutes was spent with the patient and counseling/coordination of care regarding preparing for this visit, review of most recent office visit notes, review of multiple chronic medical problems under management, review of all medications, education on nutrition, need for home health care needs and assessment, prognosis, documentation, need for follow-up.  Problem List Items Addressed This Visit       Cardiovascular and Mediastinum   Hypertension - Primary    Well-controlled hypertension. Continue losartan 100 mg daily BP Readings from Last 3 Encounters:  08/12/22 136/84  06/09/22 120/60  04/30/22 (!) 204/80        Aortic  atherosclerosis ([facility]) - per CT 04/2016    Stable.  Continue rosuvastatin 20 mg daily.        Nervous and Auditory   Vascular dementia ([place])    Stable.  Continue Zoloft 50 mg daily and Seroquel 25 mg at bedtime. Continue using BuSpar 5 mg 3 times a day as needed.      Relevant Orders   Ambulatory referral to [facility]   BPH with obstruction/lower urinary tract symptoms    Stable.  Continue Flomax 0.4 mg at bedtime        Hematopoietic and Hemostatic   Pancytopenia ([facility])    Stable.  No concerns.        Other   CLL (chronic lymphocytic leukemia) ([facility])   Gait abnormality    No recent falls.  Uses walker at home.      Relevant Orders   Ambulatory referral to [facility]   Other Visit Diagnoses     Other pancytopenia ([place])   (Chronic)        Patient Instructions  Health Maintenance After Age 41 After age 35, you are at a higher risk for certain long-term diseases and infections as well as injuries from falls. Falls are a major cause of broken bones and head injuries in people who are older than age 73. Getting regular preventive care can help to keep you healthy and well. Preventive care includes getting regular testing and making lifestyle changes as recommended by your health care provider. Talk with your health care provider about: Which screenings and tests you should have. A screening is a test that checks for a disease when you have no symptoms. A diet and exercise plan that is right for you. What should I know about screenings and tests to prevent falls? Screening and testing are the best ways to find a health problem early. Early diagnosis and treatment give you the best chance of managing medical conditions that are  common after age 71. Certain conditions and lifestyle choices may make you more likely to have a fall. Your health care provider may recommend: Regular vision checks. Poor vision and conditions such as cataracts can make you more  likely to have a fall. If you wear glasses, make sure to get your prescription updated if your vision changes. Medicine review. Work with your health care provider to regularly review all of the medicines you are taking, including over-the-counter medicines. Ask your health care provider about any side effects that may make you more likely to have a fall. Tell your health care provider if any medicines that you take make you feel dizzy or sleepy. Strength and balance checks. Your health care provider may recommend certain tests to check your strength and balance while standing, walking, or changing positions. Foot health exam. Foot pain and numbness, as well as not wearing proper footwear, can make you more likely to have a fall. Screenings, including: Osteoporosis screening. Osteoporosis is a condition that causes the bones to get weaker and break more easily. Blood pressure screening. Blood pressure changes and medicines to control blood pressure can make you feel dizzy. Depression screening. You may be more likely to have a fall if you have a fear of falling, feel depressed, or feel unable to do activities that you used to do. Alcohol use screening. Using too much alcohol can affect your balance and may make you more likely to have a fall. Follow these instructions at home: Lifestyle Do not drink alcohol if: Your health care provider tells you not to drink. If you drink alcohol: Limit how much you have to: 0-1 drink a day for women. 0-2 drinks a day for men. Know how much alcohol is in your drink. In the [place] one drink equals one 12 oz bottle of beer (355 mL), one 5 oz glass of wine (148 mL), or one 1 oz glass of hard liquor (44 mL). Do not use any products that contain nicotine or tobacco. These products include cigarettes, chewing tobacco, and vaping devices, such as e-cigarettes. If you need help quitting, ask your health care provider. Activity  Follow a regular exercise program to stay  fit. This will help you maintain your balance. Ask your health care provider what types of exercise are appropriate for you. If you need a cane or walker, use it as recommended by your health care provider. Wear supportive shoes that have nonskid soles. Safety  Remove any tripping hazards, such as rugs, cords, and clutter. Install safety equipment such as grab bars in bathrooms and safety rails on stairs. Keep rooms and walkways well-lit. General instructions Talk with your health care provider about your risks for falling. Tell your health care provider if: You fall. Be sure to tell your health care provider about all falls, even ones that seem minor. You feel dizzy, tiredness (fatigue), or off-balance. Take over-the-counter and prescription medicines only as told by your health care provider. These include supplements. Eat a healthy diet and maintain a healthy weight. A healthy diet includes low-fat dairy products, low-fat (lean) meats, and fiber from whole grains, beans, and lots of fruits and vegetables. Stay current with your vaccines. Schedule regular health, dental, and eye exams. Summary Having a healthy lifestyle and getting preventive care can help to protect your health and wellness after age 53. Screening and testing are the best way to find a health problem early and help you avoid having a fall. Early diagnosis and treatment give you the  best chance for managing medical conditions that are more common for people who are older than age 77. Falls are a major cause of broken bones and head injuries in people who are older than age 35. Take precautions to prevent a fall at home. Work with your health care provider to learn what changes you can make to improve your health and wellness and to prevent falls. This information is not intended to replace advice given to you by your health care provider. Make sure you discuss any questions you have with your health care provider. Document  Revised: 02/04/2021 Document Reviewed: 02/04/2021 Elsevier Patient Education  [place], MD [facility]

## 2022-08-12 NOTE — Assessment & Plan Note (Signed)
Stable.  Continue Flomax 0.4 mg at bedtime

## 2022-08-12 NOTE — Patient Instructions (Signed)
Health Maintenance After Age 81 After age 81, you are at a higher risk for certain long-term diseases and infections as well as injuries from falls. Falls are a major cause of broken bones and head injuries in people who are older than age 81. Getting regular preventive care can help to keep you healthy and well. Preventive care includes getting regular testing and making lifestyle changes as recommended by your health care provider. Talk with your health care provider about: Which screenings and tests you should have. A screening is a test that checks for a disease when you have no symptoms. A diet and exercise plan that is right for you. What should I know about screenings and tests to prevent falls? Screening and testing are the best ways to find a health problem early. Early diagnosis and treatment give you the best chance of managing medical conditions that are common after age 81. Certain conditions and lifestyle choices may make you more likely to have a fall. Your health care provider may recommend: Regular vision checks. Poor vision and conditions such as cataracts can make you more likely to have a fall. If you wear glasses, make sure to get your prescription updated if your vision changes. Medicine review. Work with your health care provider to regularly review all of the medicines you are taking, including over-the-counter medicines. Ask your health care provider about any side effects that may make you more likely to have a fall. Tell your health care provider if any medicines that you take make you feel dizzy or sleepy. Strength and balance checks. Your health care provider may recommend certain tests to check your strength and balance while standing, walking, or changing positions. Foot health exam. Foot pain and numbness, as well as not wearing proper footwear, can make you more likely to have a fall. Screenings, including: Osteoporosis screening. Osteoporosis is a condition that causes  the bones to get weaker and break more easily. Blood pressure screening. Blood pressure changes and medicines to control blood pressure can make you feel dizzy. Depression screening. You may be more likely to have a fall if you have a fear of falling, feel depressed, or feel unable to do activities that you used to do. Alcohol use screening. Using too much alcohol can affect your balance and may make you more likely to have a fall. Follow these instructions at home: Lifestyle Do not drink alcohol if: Your health care provider tells you not to drink. If you drink alcohol: Limit how much you have to: 0-1 drink a day for women. 0-2 drinks a day for men. Know how much alcohol is in your drink. In the [place] one drink equals one 12 oz bottle of beer (355 mL), one 5 oz glass of wine (148 mL), or one 1 oz glass of hard liquor (44 mL). Do not use any products that contain nicotine or tobacco. These products include cigarettes, chewing tobacco, and vaping devices, such as e-cigarettes. If you need help quitting, ask your health care provider. Activity  Follow a regular exercise program to stay fit. This will help you maintain your balance. Ask your health care provider what types of exercise are appropriate for you. If you need a cane or walker, use it as recommended by your health care provider. Wear supportive shoes that have nonskid soles. Safety  Remove any tripping hazards, such as rugs, cords, and clutter. Install safety equipment such as grab bars in bathrooms and safety rails on stairs. Keep rooms and walkways   well-lit. General instructions Talk with your health care provider about your risks for falling. Tell your health care provider if: You fall. Be sure to tell your health care provider about all falls, even ones that seem minor. You feel dizzy, tiredness (fatigue), or off-balance. Take over-the-counter and prescription medicines only as told by your health care provider. These include  supplements. Eat a healthy diet and maintain a healthy weight. A healthy diet includes low-fat dairy products, low-fat (lean) meats, and fiber from whole grains, beans, and lots of fruits and vegetables. Stay current with your vaccines. Schedule regular health, dental, and eye exams. Summary Having a healthy lifestyle and getting preventive care can help to protect your health and wellness after age 81. Screening and testing are the best way to find a health problem early and help you avoid having a fall. Early diagnosis and treatment give you the best chance for managing medical conditions that are more common for people who are older than age 81. Falls are a major cause of broken bones and head injuries in people who are older than age 81. Take precautions to prevent a fall at home. Work with your health care provider to learn what changes you can make to improve your health and wellness and to prevent falls. This information is not intended to replace advice given to you by your health care provider. Make sure you discuss any questions you have with your health care provider. Document Revised: 02/04/2021 Document Reviewed: 02/04/2021 Elsevier Patient Education  2023 Elsevier Inc.  

## 2022-08-12 NOTE — Assessment & Plan Note (Signed)
No recent falls.  Uses walker at home.

## 2022-08-14 ENCOUNTER — Other Ambulatory Visit: Payer: Self-pay | Admitting: *Deleted

## 2022-08-14 ENCOUNTER — Telehealth: Payer: Self-pay | Admitting: Emergency Medicine

## 2022-08-14 DIAGNOSIS — F01B18 Vascular dementia, moderate, with other behavioral disturbance: Secondary | ICD-10-CM

## 2022-08-14 DIAGNOSIS — R269 Unspecified abnormalities of gait and mobility: Secondary | ICD-10-CM

## 2022-08-14 NOTE — Telephone Encounter (Signed)
Home  health called requesting orders for patient to receive home care. Best call back number [phone]

## 2022-08-14 NOTE — Telephone Encounter (Signed)
Sent in new referral to  [facility].

## 2022-08-25 ENCOUNTER — Ambulatory Visit: Payer: Self-pay

## 2022-08-25 NOTE — Patient Instructions (Signed)
Visit Information  Thank you for taking time to visit with me today. Please don't hesitate to contact me if I can be of assistance to you.   Following are the goals we discussed today:   Goals Addressed             This Visit's Progress    Community resources       Care Coordination Interventions: Confirmed with patient's wife that home health agency is scheduled to see patient Provided care guide's contact number re: follow up of socialization resources. Message also sent to care guide that patient wife is still interested in resources Encouraged spouse to contact RN Care Manager as needed for care coordination needs      Our next appointment is by telephone on 09/03/22 at 2:00pm  Please call the care guide team at [phone] if you need to cancel or reschedule your appointment.   If you are experiencing a Mental Health or [place] or need someone to talk to, please call the Suicide and Crisis Lifeline: 988  Patient verbalizes understanding of instructions and care plan provided today and agrees to view in [place]. Active MyChart status and patient understanding of how to access instructions and care plan via MyChart confirmed with patient.     Thea Silversmith, RN, MSN, BSN, [place] Coordinator [phone]

## 2022-08-25 NOTE — Patient Outreach (Signed)
  Care Coordination   Follow Up Visit Note   08/25/2022 Name: RUFORD DUDZINSKI MRN: [id number] DOB: 1941/02/10  Marlou Starks Faulks is a 81 y.o. year old male who sees Sagardia, Ines Bloomer, MD for primary care. I spoke with Marlou Starks Kauffman's wife/caregiver by phone today(patient with dementia).  What matters to the patients health and wellness today?  Mrs. Statzer reports patient has followed up with primary care provider who has ordered home health. And she reports she has spoken with home health agency who is scheduled to make a visit on tomorrow. Per review of chart, care guide made three unsuccessful attempts to provide socialization information. Mrs. Meroney is still interested in socialization resources.    Goals Addressed             This Visit's Progress    Community resources       Care Coordination Interventions: Confirmed with patient's wife that home health agency is scheduled to see patient Provided care guide's contact number re: follow up of socialization resources. Message also sent to care guide that patient wife is still interested in resources Encouraged spouse to contact RN Care Manager as needed for care coordination needs      SDOH assessments and interventions completed:  No  Care Coordination Interventions:  Yes, provided   Follow up plan: Follow up call scheduled for 09/03/22    Encounter Outcome:  Pt. Visit Completed   Thea Silversmith, RN, MSN, BSN, [place] Coordinator [phone]

## 2022-09-03 ENCOUNTER — Telehealth: Payer: Self-pay | Admitting: Emergency Medicine

## 2022-09-03 ENCOUNTER — Telehealth: Payer: Self-pay

## 2022-09-03 NOTE — Telephone Encounter (Signed)
[place]  VERBAL ORDERS FOR Physical Therapy:  1 X WK for 9 WKS  SECURE VM to leave verbal orders

## 2022-09-03 NOTE — Patient Outreach (Signed)
  Care Coordination   09/03/2022 Name: Javier [name] MRN: [id number] DOB: [birth year]   Care Coordination Outreach Attempts:  An unsuccessful telephone outreach was attempted today to offer the patient information about available care coordination services as a benefit of their health plan.   Follow Up Plan:  Additional outreach attempts will be made to offer the patient care coordination information and services.   Encounter Outcome:  No Answer   Care Coordination Interventions:  No, not indicated    Thea Silversmith, RN, MSN, BSN, [place] Coordinator [phone]

## 2022-09-04 NOTE — Telephone Encounter (Signed)
Called and left message on secure VM for [facility] to ok verbals per provider

## 2022-09-04 NOTE — Telephone Encounter (Signed)
Okay for verbal orders. 

## 2022-09-18 ENCOUNTER — Telehealth: Payer: Self-pay | Admitting: Emergency Medicine

## 2022-09-18 NOTE — Telephone Encounter (Signed)
error 

## 2022-09-18 NOTE — Telephone Encounter (Signed)
Patient would like to been today or tomorrow for cold symptoms. I advised Dr. Rosezella Rumpf had no appointments, but that I would forward this info to a clinical member and have them call patient back. Please call patient to assist.

## 2022-09-19 ENCOUNTER — Ambulatory Visit (INDEPENDENT_AMBULATORY_CARE_PROVIDER_SITE_OTHER): Payer: Medicare HMO | Admitting: Internal Medicine

## 2022-09-19 ENCOUNTER — Ambulatory Visit (INDEPENDENT_AMBULATORY_CARE_PROVIDER_SITE_OTHER): Payer: Medicare HMO

## 2022-09-19 ENCOUNTER — Encounter: Payer: Self-pay | Admitting: Internal Medicine

## 2022-09-19 VITALS — BP 160/82 | HR 59 | Temp 98.0°F | Ht 71.0 in | Wt 156.0 lb

## 2022-09-19 DIAGNOSIS — I10 Essential (primary) hypertension: Secondary | ICD-10-CM | POA: Diagnosis not present

## 2022-09-19 DIAGNOSIS — R0989 Other specified symptoms and signs involving the circulatory and respiratory systems: Secondary | ICD-10-CM

## 2022-09-19 DIAGNOSIS — R051 Acute cough: Secondary | ICD-10-CM

## 2022-09-19 DIAGNOSIS — E559 Vitamin D deficiency, unspecified: Secondary | ICD-10-CM

## 2022-09-19 DIAGNOSIS — R0689 Other abnormalities of breathing: Secondary | ICD-10-CM | POA: Insufficient documentation

## 2022-09-19 DIAGNOSIS — R059 Cough, unspecified: Secondary | ICD-10-CM | POA: Diagnosis not present

## 2022-09-19 MED ORDER — HYDROCODONE BIT-HOMATROP MBR 5-1.5 MG/5ML PO SOLN: 5.0000 mL | Freq: Four times a day (QID) | ORAL | 0 refills | Status: AC | PRN

## 2022-09-19 MED ORDER — LEVOFLOXACIN 500 MG PO TABS: 500.0000 mg | ORAL_TABLET | Freq: Every day | ORAL | 0 refills | Status: AC

## 2022-09-19 NOTE — Assessment & Plan Note (Signed)
Also with few rales on unclear significance, but given current symptoms will need CXR r/o pna

## 2022-09-19 NOTE — Assessment & Plan Note (Signed)
BP Readings from Last 3 Encounters:  09/19/22 (!) 160/82  08/12/22 136/84  06/09/22 120/60   Uncontrolled, likely reactive, pt to continue medical treatment losartan 100 mg qd

## 2022-09-19 NOTE — Progress Notes (Signed)
Patient ID: Javier [name], male   DOB: [birth year], 81 y.o.   MRN: [id number]        Chief Complaint: follow up cough and chest congestion, htn, low vit d       HPI:  Javier [name] is a 81 y.o. male Here with acute onset mild to mod 2-3 days ST, HA, general weakness and malaise, with prod cough greenish sputum, but Pt denies chest pain, increased sob or doe, wheezing, orthopnea, PND, increased LE swelling, palpitations, dizziness or syncope.   Pt denies polydipsia, polyuria, or new focal neuro s/s.    Pt denies recent wt loss, night sweats, loss of appetite, or other constitutional symptoms        Wt Readings from Last 3 Encounters:  09/19/22 156 lb (70.8 kg)  08/12/22 150 lb 2 oz (68.1 kg)  06/09/22 151 lb (68.5 kg)   BP Readings from Last 3 Encounters:  09/19/22 (!) 160/82  08/12/22 136/84  06/09/22 120/60         Past Medical History:  Diagnosis Date   Diverticulosis    Hyperlipidemia    Hypertension    Memory loss    Prediabetes    Vitamin D deficiency    Past Surgical History:  Procedure Laterality Date   NO PAST SURGERIES      reports that he quit smoking about 19 years ago. His smoking use included cigarettes. He has never used smokeless tobacco. He reports that he does not currently use alcohol. He reports that he does not use drugs. family history includes Cirrhosis in his brother; Colon cancer in his paternal grandfather; Diabetes in his brother; Early death in his father; Stroke in his mother. 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     busPIRone (BUSPAR) 5 MG tablet Take 1 tablet  3 x /day  for Nervousness & Chronic Anxiety 270 tablet 1    Cholecalciferol (VITAMIN D3) 5000 units TABS Take 5,000 Units by mouth daily.     fluticasone (FLONASE) 50 MCG/ACT nasal spray Place 2 sprays into both nostrils daily. 16 g 2   hydrocortisone (ANUSOL-HC) 2.5 % rectal cream Place 1 Application rectally 2 (two) times daily. 30 g 0   lipase/protease/amylase (CREON) 12000-38000 units CPEP capsule Take 12,000 Units by mouth 3 (three) times daily with meals.     losartan (COZAAR) 100 MG tablet Take 1 tablet by mouth once daily 90 tablet 0   QUEtiapine (SEROQUEL) 25 MG tablet Take 1 tablet (25 mg total) by mouth at bedtime. 90 tablet 3   rosuvastatin (CRESTOR) 20 MG tablet TAKE 1 TABLET EVERY DAY FOR CHOLESTEROL 90 tablet 10   senna-docusate (SENOKOT-S) 8.6-50 MG tablet Take 1 tablet by mouth at bedtime as needed for mild constipation. 100 tablet 0   sertraline (ZOLOFT) 50 MG tablet Take  1 tablet  Daily  for Mood  / Chronic Anxiety 90 tablet 3   tamsulosin (FLOMAX) 0.4 MG CAPS capsule Take 1 capsule (0.4 mg total) by mouth daily after supper. For prostate. 90 capsule 1   No current facility-administered medications on file prior to visit.  ROS:  All others reviewed and negative.  Objective        PE:  BP (!) 160/82 (BP Location: Left Arm, Patient Position: Sitting, Cuff Size: Normal)   Pulse (!) 59   Temp 98 F (36.7 C) (Oral)   Ht '5\' 11"'$  (1.803 m)   Wt 156 lb (70.8 kg)   SpO2 99%   BMI 21.76 kg/m                 Constitutional: Pt appears in NAD               HENT: Head: NCAT.                Right Ear: External ear normal.                 Left Ear: External ear normal.                Eyes: . Pupils are equal, round, and reactive to light. Conjunctivae and EOM are normal               Nose: without d/c or deformity               Neck: Neck supple. Gross normal ROM               Cardiovascular: Normal rate and regular rhythm.                 Pulmonary/Chest: Effort normal and breath sounds with few mid left upper lung field rales,  with no wheezing.                Neurological: Pt is alert. At baseline orientation, motor grossly intact               Skin: Skin is warm. No rashes, no other new lesions, LE edema - none               Psychiatric: Pt behavior is normal without agitation   Micro: none  Cardiac tracings I have personally interpreted today:  none  Pertinent Radiological findings (summarize): none   Lab Results  Component Value Date   WBC 5.6 06/09/2022   HGB 12.7 (L) 06/09/2022   HCT 38.8 (L) 06/09/2022   PLT 137.0 (L) 06/09/2022   GLUCOSE 89 06/09/2022   CHOL 183 12/18/2021   TRIG 69 12/18/2021   HDL 55 12/18/2021   LDLCALC 112 (H) 12/18/2021   ALT 8 06/09/2022   AST 12 06/09/2022   NA 141 06/09/2022   K 3.9 06/09/2022   CL 106 06/09/2022   CREATININE 1.04 06/09/2022   BUN 14 06/09/2022   CO2 29 06/09/2022   TSH 1.67 08/27/2021   PSA 2.87 01/09/2021   INR 1.1 08/09/2021   HGBA1C 5.6 08/10/2021   MICROALBUR 0.8 01/09/2021   Assessment/Plan:  Javier [name] is a 81 y.o. Black or African American [2] male with  has a past medical history of Diverticulosis, Hyperlipidemia, Hypertension, Memory loss, Prediabetes, and Vitamin D deficiency.  Abnormal breath sounds Also with few rales on unclear significance, but given current symptoms will need CXR r/o pna  Cough Mild to mod, c/w bronchitis vs pna, for antibx course levaquin 500 qd, cough med prn,  to f/u any worsening symptoms or concerns   Hypertension BP Readings from Last 3 Encounters:  09/19/22 (!) 160/82  08/12/22 136/84  06/09/22 120/60   Uncontrolled, likely reactive, pt to continue medical treatment losartan 100 mg qd  Vitamin D deficiency Last vitamin D Lab Results  Component Value Date   VD25OH 89 08/27/2021   Stable, cont oral replacement  Followup: No follow-ups on file.  Cathlean Cower, MD 09/19/2022 1:09 PM [facility]

## 2022-09-19 NOTE — Assessment & Plan Note (Signed)
Mild to mod, c/w bronchitis vs pna, for antibx course levaquin 500 qd, cough med prn,  to f/u any worsening symptoms or concerns

## 2022-09-19 NOTE — Assessment & Plan Note (Signed)
Last vitamin D Lab Results  Component Value Date   VD25OH 89 08/27/2021   Stable, cont oral replacement

## 2022-09-19 NOTE — Patient Instructions (Signed)
Please take all new medication as prescribed  - the antibiotic, and cough medicine as needed  Please continue all other medications as before, and refills have been done if requested.  Please have the pharmacy call with any other refills you may need.  Please keep your appointments with your specialists as you may have planned  Please go to the XRAY Department in the first floor for the x-ray testing  You will be contacted by phone if any changes need to be made immediately.  Otherwise, you will receive a letter about your results with an explanation, but please check with MyChart first.  Please remember to sign up for MyChart if you have not done so, as this will be important to you in the future with finding out test results, communicating by private email, and scheduling acute appointments online when needed.

## 2022-09-20 ENCOUNTER — Other Ambulatory Visit: Payer: Self-pay | Admitting: Nurse Practitioner

## 2022-09-21 ENCOUNTER — Other Ambulatory Visit: Payer: Self-pay

## 2022-09-21 ENCOUNTER — Encounter (HOSPITAL_COMMUNITY): Payer: Self-pay

## 2022-09-21 ENCOUNTER — Emergency Department (HOSPITAL_COMMUNITY)
Admission: EM | Admit: 2022-09-21 | Discharge: 2022-09-21 | Disposition: A | Discharge status: Home / Self Care | Payer: Medicare HMO | Attending: Emergency Medicine | Admitting: Emergency Medicine

## 2022-09-21 ENCOUNTER — Emergency Department (HOSPITAL_COMMUNITY): Payer: Medicare HMO

## 2022-09-21 DIAGNOSIS — R519 Headache, unspecified: Secondary | ICD-10-CM | POA: Diagnosis not present

## 2022-09-21 DIAGNOSIS — Z7982 Long term (current) use of aspirin: Secondary | ICD-10-CM | POA: Insufficient documentation

## 2022-09-21 DIAGNOSIS — I10 Essential (primary) hypertension: Secondary | ICD-10-CM | POA: Diagnosis not present

## 2022-09-21 DIAGNOSIS — D649 Anemia, unspecified: Secondary | ICD-10-CM | POA: Insufficient documentation

## 2022-09-21 DIAGNOSIS — Z79899 Other long term (current) drug therapy: Secondary | ICD-10-CM | POA: Diagnosis not present

## 2022-09-21 LAB — CBC WITH DIFFERENTIAL/PLATELET
Abs Immature Granulocytes: 0.01 10*3/uL (ref 0.00–0.07)
Basophils Absolute: 0 10*3/uL (ref 0.0–0.1)
Basophils Relative: 1 %
Eosinophils Absolute: 0.1 10*3/uL (ref 0.0–0.5)
Eosinophils Relative: 1 %
HCT: 38.1 % — ABNORMAL LOW (ref 39.0–52.0)
Hemoglobin: 12.2 g/dL — ABNORMAL LOW (ref 13.0–17.0)
Immature Granulocytes: 0 %
Lymphocytes Relative: 37 %
Lymphs Abs: 1.8 10*3/uL (ref 0.7–4.0)
MCH: 30.2 pg (ref 26.0–34.0)
MCHC: 32 g/dL (ref 30.0–36.0)
MCV: 94.3 fL (ref 80.0–100.0)
Monocytes Absolute: 0.5 10*3/uL (ref 0.1–1.0)
Monocytes Relative: 10 %
Neutro Abs: 2.5 10*3/uL (ref 1.7–7.7)
Neutrophils Relative %: 51 %
Platelets: 150 10*3/uL (ref 150–400)
RBC: 4.04 MIL/uL — ABNORMAL LOW (ref 4.22–5.81)
RDW: 13.7 % (ref 11.5–15.5)
WBC: 4.9 10*3/uL (ref 4.0–10.5)
nRBC: 0 % (ref 0.0–0.2)

## 2022-09-21 LAB — URINALYSIS, ROUTINE W REFLEX MICROSCOPIC
Bilirubin Urine: NEGATIVE
Glucose, UA: NEGATIVE mg/dL
Hgb urine dipstick: NEGATIVE
Ketones, ur: NEGATIVE mg/dL
Leukocytes,Ua: NEGATIVE
Nitrite: NEGATIVE
Protein, ur: NEGATIVE mg/dL
Specific Gravity, Urine: 1.011 (ref 1.005–1.030)
pH: 6 (ref 5.0–8.0)

## 2022-09-21 LAB — BASIC METABOLIC PANEL
Anion gap: 7 (ref 5–15)
BUN: 14 mg/dL (ref 8–23)
CO2: 24 mmol/L (ref 22–32)
Calcium: 9.5 mg/dL (ref 8.9–10.3)
Chloride: 108 mmol/L (ref 98–111)
Creatinine, Ser: 0.96 mg/dL (ref 0.61–1.24)
GFR, Estimated: >60 mL/min (ref >60)
Glucose, Bld: 87 mg/dL (ref 70–99)
Potassium: 4.1 mmol/L (ref 3.5–5.1)
Sodium: 139 mmol/L (ref 135–145)

## 2022-09-21 MED ORDER — LACTATED RINGERS IV BOLUS
500.0000 mL | Freq: Once | INTRAVENOUS | Status: AC
  Administered 2022-09-21: 500 mL via INTRAVENOUS

## 2022-09-21 MED ORDER — DIPHENHYDRAMINE HCL 50 MG/ML IJ SOLN
12.5000 mg | Freq: Once | INTRAMUSCULAR | Status: AC
  Administered 2022-09-21: 12.5 mg via INTRAVENOUS
  Filled 2022-09-21: qty 1

## 2022-09-21 MED ORDER — ACETAMINOPHEN 500 MG PO TABS
1000.0000 mg | ORAL_TABLET | Freq: Once | ORAL | Status: AC
  Administered 2022-09-21: 1000 mg via ORAL
  Filled 2022-09-21: qty 2

## 2022-09-21 MED ORDER — METOCLOPRAMIDE HCL 5 MG/ML IJ SOLN
10.0000 mg | Freq: Once | INTRAMUSCULAR | Status: AC
  Administered 2022-09-21: 10 mg via INTRAVENOUS
  Filled 2022-09-21: qty 2

## 2022-09-21 NOTE — ED Provider Notes (Signed)
[facility] EMERGENCY DEPARTMENT Provider Note   CSN: [id number] Arrival date & time: 09/21/22  1341     History  Chief Complaint  Patient presents with   Hypertension    Javier [name] is a 81 y.o. male.   Hypertension Associated symptoms include headaches.     81 year old male with medical history significant for HLD, HTN, presenting to the emergency department with a headache and uncontrolled blood pressure.  The patient states that he was seen by his PCP yesterday for a cough for the past 2 to 3 days.  He was prescribed Levaquin and cough medicine.  He states that when he started taking the cough medicine which, over the name of he then developed a headache.  He has had a headache and uncontrolled blood pressure at home.  He describes a gradual onset of his headache yesterday.  He denies any blurry vision, loss, neck stiffness, fever or chills.  He denies any numbness, weakness, facial droop, dysarthria.  He endorses a bitemporal headache with no radiation.  He denies any syncope.  Denies any chest pain or shortness of breath.  Home Medications Prior to Admission medications   Medication Sig Start Date End Date Taking? Authorizing Provider  aspirin EC 81 MG tablet Take 81 mg by mouth daily.    [provider]  busPIRone (BUSPAR) 5 MG tablet Take 1 tablet  3 x /day  for Nervousness & Chronic Anxiety 04/13/22   Unk Pinto, MD  Cholecalciferol (VITAMIN D3) 5000 units TABS Take 5,000 Units by mouth daily.    [provider]  fluticasone (FLONASE) 50 MCG/ACT nasal spray Place 2 sprays into both nostrils daily. 12/18/21 12/18/22  Alycia Rossetti, NP  HYDROcodone bit-homatropine (HYCODAN) 5-1.5 MG/5ML syrup Take 5 mLs by mouth every 6 (six) hours as needed for up to 10 days. 09/19/22 09/29/22  Biagio Borg, MD  hydrocortisone (ANUSOL-HC) 2.5 % rectal cream Place 1 Application rectally 2 (two) times daily. 04/30/22   Long, Wonda Olds, MD   levofloxacin (LEVAQUIN) 500 MG tablet Take 1 tablet (500 mg total) by mouth daily for 10 days. 09/19/22 09/29/22  Biagio Borg, MD  lipase/protease/amylase (CREON) 12000-38000 units CPEP capsule Take 12,000 Units by mouth 3 (three) times daily with meals.    [provider]  losartan (COZAAR) 100 MG tablet Take 1 tablet by mouth once daily 06/02/22   Darrol Jump, NP  QUEtiapine (SEROQUEL) 25 MG tablet Take 1 tablet (25 mg total) by mouth at bedtime. 12/18/21   Alycia Rossetti, NP  rosuvastatin (CRESTOR) 20 MG tablet TAKE 1 TABLET EVERY DAY FOR CHOLESTEROL 07/14/22   Cranford, Kenney Houseman, NP  senna-docusate (SENOKOT-S) 8.6-50 MG tablet Take 1 tablet by mouth at bedtime as needed for mild constipation. 04/30/22   Long, Wonda Olds, MD  sertraline (ZOLOFT) 50 MG tablet Take  1 tablet  Daily  for Mood  / Chronic Anxiety 09/10/21   Unk Pinto, MD  tamsulosin (FLOMAX) 0.4 MG CAPS capsule Take 1 capsule (0.4 mg total) by mouth daily after supper. For prostate. 01/09/21   Liane Comber, NP      Allergies    Penicillins and Ppd [tuberculin purified protein derivative]    Review of Systems   Review of Systems  Neurological:  Positive for headaches.  All other systems reviewed and are negative.   Physical Exam Updated Vital Signs BP (!) 184/98   Pulse 62   Temp 98.2 F (36.8 C)   Resp 20  SpO2 97%  Physical Exam Vitals and nursing note reviewed.  Constitutional:      General: He is not in acute distress.    Appearance: He is well-developed.  HENT:     Head: Normocephalic and atraumatic.  Eyes:     Conjunctiva/sclera: Conjunctivae normal.  Cardiovascular:     Rate and Rhythm: Normal rate and regular rhythm.  Pulmonary:     Effort: Pulmonary effort is normal. No respiratory distress.     Breath sounds: Normal breath sounds.  Abdominal:     Palpations: Abdomen is soft.     Tenderness: There is no abdominal tenderness.  Musculoskeletal:        General: No swelling.      Cervical back: Neck supple.  Skin:    General: Skin is warm and dry.     Capillary Refill: Capillary refill takes less than 2 seconds.  Neurological:     Mental Status: He is alert.     Comments: MENTAL STATUS EXAM:    Orientation: Alert and oriented to person, place and time.  Memory: Cooperative, follows commands well.  Language: Speech is clear and language is normal.   CRANIAL NERVES:    CN 2 (Optic): Visual fields intact to confrontation.  CN 3,4,6 (EOM): Pupils equal and reactive to light. Full extraocular eye movement without nystagmus.  CN 5 (Trigeminal): Facial sensation is normal, no weakness of masticatory muscles.  CN 7 (Facial): No facial weakness or asymmetry.  CN 8 (Auditory): Auditory acuity grossly normal.  CN 9,10 (Glossophar): The uvula is midline, the palate elevates symmetrically.  CN 11 (spinal access): Normal sternocleidomastoid and trapezius strength.  CN 12 (Hypoglossal): The tongue is midline. No atrophy or fasciculations.Marland Kitchen   MOTOR:  Muscle Strength: 5/5RUE, 5/5LUE, 5/5RLE, 5/5LLE.   COORDINATION:   Intact finger-to-nose, no tremor.   SENSATION:   Intact to light touch all four extremities.    Psychiatric:        Mood and Affect: Mood normal.     ED Results / Procedures / Treatments   Labs (all labs ordered are listed, but only abnormal results are displayed) Labs Reviewed  CBC WITH DIFFERENTIAL/PLATELET - Abnormal; Notable for the following components:      Result Value   RBC 4.04 (*)    Hemoglobin 12.2 (*)    HCT 38.1 (*)    All other components within normal limits  BASIC METABOLIC PANEL  URINALYSIS, ROUTINE W REFLEX MICROSCOPIC    EKG EKG Interpretation  Date/Time:  Sunday September 21 2022 15:53:06 EST Ventricular Rate:  58 PR Interval:  220 QRS Duration: 98 QT Interval:  436 QTC Calculation: 429 R Axis:   55 Text Interpretation: Sinus rhythm Prolonged PR interval Confirmed by Regan Lemming ([id number]) on 09/21/2022 3:56:00  PM  Radiology CT Head Wo Contrast  Result Date: 09/21/2022 CLINICAL DATA:  Headache. EXAM: CT HEAD WITHOUT CONTRAST TECHNIQUE: Contiguous axial images were obtained from the base of the skull through the vertex without intravenous contrast. RADIATION DOSE REDUCTION: This exam was performed according to the departmental dose-optimization program which includes automated exposure control, adjustment of the mA and/or kV according to patient size and/or use of iterative reconstruction technique. COMPARISON:  08/09/2021. FINDINGS: Brain: No evidence of acute infarction, hemorrhage, hydrocephalus, extra-axial collection or mass lesion/mass effect. Vascular: No hyperdense vessel or unexpected calcification. Skull: Normal. Negative for fracture or focal lesion. Sinuses/Orbits: Globes and orbits are unremarkable. Minor scattered ethmoid and left maxillary sinus mucosal thickening. Other: None. IMPRESSION: 1. No acute  intracranial abnormalities. Stable appearance from the prior study. Electronically Signed   By: Lajean Manes M.D.   On: 09/21/2022 15:39    Procedures Procedures    Medications Ordered in ED Medications  metoCLOPramide (REGLAN) injection 10 mg (10 mg Intravenous Given 09/21/22 1611)  diphenhydrAMINE (BENADRYL) injection 12.5 mg (12.5 mg Intravenous Given 09/21/22 1611)  acetaminophen (TYLENOL) tablet 1,000 mg (1,000 mg Oral Given 09/21/22 1613)  lactated ringers bolus 500 mL (0 mLs Intravenous Stopped 09/21/22 1734)    ED Course/ Medical Decision Making/ A&P                           Medical Decision Making Risk OTC drugs. Prescription drug management.    81 year old male with medical history significant for HLD, HTN, presenting to the emergency department with a headache and uncontrolled blood pressure.  The patient states that he was seen by his PCP yesterday for a cough for the past 2 to 3 days.  He was prescribed Levaquin and cough medicine.  He states that when he started  taking the cough medicine which, over the name of he then developed a headache.  He has had a headache and uncontrolled blood pressure at home.  He describes a gradual onset of his headache yesterday.  He denies any blurry vision, loss, neck stiffness, fever or chills.  He denies any numbness, weakness, facial droop, dysarthria.  He endorses a bitemporal headache with no radiation.  He denies any syncope.  Denies any chest pain or shortness of breath.  On arrival, the patient was vitally stable, afebrile, not tachycardic or tachypneic, hypertensive BP 181/87, saturating 99% on room air.  Physical exam significant for a normal neurologic exam.  The patient denies sudden onset or maximal onset headache.  Low concern for ICH or subarachnoid hemorrhage at this time.  Currently, he is awake, alert, GCS 15, HDS, and afebrile. His exam is most notable for normal gait, fully intact extraocular motions with bilaterally reactive pupils, no focal neurologic deficits, no meningismus, and no temporal tenderness. There is no rash. The headache was not sudden onset or the worst headache of the patient's life. There is no visual deficit.  To further evaluate and risk stratify him, labs and imaging were obtained, which were significant for:  Labs: CBC without a leukocytosis, stable anemia to 12.2, UA unremarkable, BMP unremarkable. Imaging: CT Head: IMPRESSION:  1. No acute intracranial abnormalities. Stable appearance from the  prior study.    I do not think the patient has an aneurysm, intracranial bleed, mass lesion, meningitis, temporal arteritis, stroke, cluster headache, idiopathic intracranial hypertension, cavernous sinus thrombosis, carbon monoxide toxicity, herpes zoster, carotid or vertebral artery dissection, or acute angle close glaucoma.  On reassessment, the patient remained well appearing and was again able to ambulate without difficulty and did not have any focal neurologic deficits.  He was  administered a migraine cocktail and had complete resolution of symptoms.  His blood pressure also came down to 166/83 on repeat assessment.  I believe the patient is stable for discharge.  We participated in shared decision making regarding continued observation in the ED versus discharge home for continued recovery after receiving the above medications. He preferred to recover at home. I believe that this is safe and reasonable.  We have discussed the diagnosis and risks, and we agree with discharging home to follow-up with their primary doctor. We also discussed returning to the Emergency Department immediately if new or  worsening symptoms occur. We have discussed the symptoms which are most concerning (e.g., changing or worsening pain, weakness, vomiting, fever, or abnormal sensation) that necessitate immediate return. I provided ED return precautions. The patient felt safe with this plan.  ED Medication Summary: Medications  metoCLOPramide (REGLAN) injection 10 mg (10 mg Intravenous Given 09/21/22 1611)  diphenhydrAMINE (BENADRYL) injection 12.5 mg (12.5 mg Intravenous Given 09/21/22 1611)  acetaminophen (TYLENOL) tablet 1,000 mg (1,000 mg Oral Given 09/21/22 1613)  lactated ringers bolus 500 mL (0 mLs Intravenous Stopped 09/21/22 1734)     Final Clinical Impression(s) / ED Diagnoses Final diagnoses:  Hypertension, unspecified type  Acute nonintractable headache, unspecified headache type    Rx / DC Orders ED Discharge Orders     None         Regan Lemming, MD 09/21/22 2117

## 2022-09-21 NOTE — ED Notes (Signed)
Attempted calling pt's wife 3 times who did not pick up.  Left a voicemail.

## 2022-09-21 NOTE — Discharge Instructions (Signed)
Your headache symptoms improved with a migraine cocktail and your blood pressure improved after this.  Your CT imaging was negative.  Your story is not consistent with intracranial bleeding.

## 2022-09-21 NOTE — ED Triage Notes (Signed)
Pt arrived POV from home c/o HTN and a headache. Pt has a hx of the same and takes meds but has not missed any doses.

## 2022-09-21 NOTE — ED Notes (Signed)
Spouse showed up and escorted pt home. Discharge info reviewed by Westly Pam, RN prior to departure. Unable to speak with spouse prior to them leaving.

## 2022-09-21 NOTE — ED Provider Triage Note (Signed)
Emergency Medicine Provider Triage Evaluation Note  Javier [name] , a 81 y.o. male  was evaluated in triage.  Pt complains of elevated blood pressure at home. Also notes headache. Has a history of similar symptoms. No missed doses of his antihypertensives. Denies chest pain, shortness of breath, numbness, tingling, weakness, vision changes.   Review of Systems  Positive:  Negative:   Physical Exam  BP (!) 181/87 (BP Location: Right Arm)   Pulse 65   Temp 98.5 F (36.9 C) (Oral)   Resp 17   SpO2 99%  Gen:   Awake, no distress   Resp:  Normal effort  MSK:   Moves extremities without difficulty  Other:  No focal neuro deficits.   Medical Decision Making  Medically screening exam initiated at 1:55 PM.  Appropriate orders placed.  SAABIR BLYTH was informed that the remainder of the evaluation will be completed by another provider, this initial triage assessment does not replace that evaluation, and the importance of remaining in the ED until their evaluation is complete.     Clester Chlebowski A, PA-C 09/21/22 1402

## 2022-09-23 ENCOUNTER — Other Ambulatory Visit: Payer: Self-pay

## 2022-09-23 MED ORDER — LOSARTAN POTASSIUM 100 MG PO TABS: 100.0000 mg | ORAL_TABLET | Freq: Every day | ORAL | 0 refills | Status: DC

## 2022-09-24 ENCOUNTER — Encounter: Payer: Self-pay | Admitting: Internal Medicine

## 2022-09-24 NOTE — Telephone Encounter (Signed)
Spoke with the pts caregiver and she has stated pt is doing a little better. He was seen on 09/19/2022 by Dr. Jenny Reichmann.

## 2022-09-26 ENCOUNTER — Telehealth: Payer: Self-pay

## 2022-09-26 NOTE — Telephone Encounter (Signed)
        Patient  visited [place] on 12/24     Telephone encounter attempt :  1st  A HIPAA compliant voice message was left requesting a return call.  Instructed patient to call back.    [facility], Care Management  [phone] [address], [place], [place] [address] Phone: [phone] Email: [email or web address]

## 2022-09-30 ENCOUNTER — Telehealth: Payer: Self-pay

## 2022-09-30 NOTE — Telephone Encounter (Signed)
     Patient  visit on 12/24  at [facility]  Have you been able to follow up with your primary care physician? Yes   The patient was or was not able to obtain any needed medicine or equipment. Yes   Are there diet recommendations that you are having difficulty following? Na   Patient expresses understanding of discharge instructions and education provided has no other needs at this time.  Yes     [place], [facility], Care Management  [phone] [address], [place], [place] [address] Phone: [phone] Email: [email or web address]

## 2022-10-03 ENCOUNTER — Other Ambulatory Visit: Payer: Self-pay | Admitting: Nurse Practitioner

## 2022-10-03 DIAGNOSIS — F01B18 Vascular dementia, moderate, with other behavioral disturbance: Secondary | ICD-10-CM

## 2022-10-06 ENCOUNTER — Telehealth: Payer: Self-pay | Admitting: Internal Medicine

## 2022-10-06 DIAGNOSIS — R451 Restlessness and agitation: Secondary | ICD-10-CM

## 2022-10-08 ENCOUNTER — Telehealth: Payer: Self-pay | Admitting: Emergency Medicine

## 2022-10-08 DIAGNOSIS — F01B18 Vascular dementia, moderate, with other behavioral disturbance: Secondary | ICD-10-CM

## 2022-10-08 MED ORDER — QUETIAPINE FUMARATE 25 MG PO TABS: 25.0000 mg | ORAL_TABLET | Freq: Every day | ORAL | 3 refills | Status: DC

## 2022-10-08 NOTE — Telephone Encounter (Signed)
Okay to refill? 

## 2022-10-08 NOTE — Addendum Note (Signed)
Addended by: Rae Mar on: 10/08/2022 04:52 PM   Modules accepted: Orders

## 2022-10-08 NOTE — Telephone Encounter (Signed)
Patient wife called stating patient needs a refill of his Quetiapine '25mg'$ .

## 2022-10-08 NOTE — Telephone Encounter (Signed)
Please resend to [facility] on [address]

## 2022-10-08 NOTE — Telephone Encounter (Signed)
New rx sent to patient pharmacy  

## 2022-10-21 ENCOUNTER — Encounter: Payer: Self-pay | Admitting: Emergency Medicine

## 2022-10-21 ENCOUNTER — Ambulatory Visit (INDEPENDENT_AMBULATORY_CARE_PROVIDER_SITE_OTHER): Payer: Medicare HMO | Admitting: Emergency Medicine

## 2022-10-21 VITALS — BP 158/90 | HR 69 | Temp 98.1°F | Ht 71.0 in | Wt 151.2 lb

## 2022-10-21 DIAGNOSIS — N138 Other obstructive and reflux uropathy: Secondary | ICD-10-CM

## 2022-10-21 DIAGNOSIS — I10 Essential (primary) hypertension: Secondary | ICD-10-CM | POA: Diagnosis not present

## 2022-10-21 DIAGNOSIS — Z9181 History of falling: Secondary | ICD-10-CM

## 2022-10-21 DIAGNOSIS — C9110 Chronic lymphocytic leukemia of B-cell type not having achieved remission: Secondary | ICD-10-CM

## 2022-10-21 DIAGNOSIS — R531 Weakness: Secondary | ICD-10-CM | POA: Diagnosis not present

## 2022-10-21 DIAGNOSIS — F0150 Vascular dementia without behavioral disturbance: Secondary | ICD-10-CM | POA: Diagnosis not present

## 2022-10-21 DIAGNOSIS — N401 Enlarged prostate with lower urinary tract symptoms: Secondary | ICD-10-CM | POA: Diagnosis not present

## 2022-10-21 DIAGNOSIS — D61818 Other pancytopenia: Secondary | ICD-10-CM

## 2022-10-21 DIAGNOSIS — E782 Mixed hyperlipidemia: Secondary | ICD-10-CM | POA: Diagnosis not present

## 2022-10-21 LAB — CBC WITH DIFFERENTIAL/PLATELET
Basophils Absolute: 0 10*3/uL (ref 0.0–0.1)
Basophils Relative: 0.7 % (ref 0.0–3.0)
Eosinophils Absolute: 0 10*3/uL (ref 0.0–0.7)
Eosinophils Relative: 0.6 % (ref 0.0–5.0)
HCT: 40.7 % (ref 39.0–52.0)
Hemoglobin: 13.1 g/dL (ref 13.0–17.0)
Lymphocytes Relative: 38.6 % (ref 12.0–46.0)
Lymphs Abs: 1.6 10*3/uL (ref 0.7–4.0)
MCHC: 32.2 g/dL (ref 30.0–36.0)
MCV: 93.5 fl (ref 78.0–100.0)
Monocytes Absolute: 0.4 10*3/uL (ref 0.1–1.0)
Monocytes Relative: 9.4 % (ref 3.0–12.0)
Neutro Abs: 2 10*3/uL (ref 1.4–7.7)
Neutrophils Relative %: 50.7 % (ref 43.0–77.0)
Platelets: 146 10*3/uL — ABNORMAL LOW (ref 150.0–400.0)
RBC: 4.35 Mil/uL (ref 4.22–5.81)
RDW: 14.4 % (ref 11.5–15.5)
WBC: 4 10*3/uL (ref 4.0–10.5)

## 2022-10-21 LAB — COMPREHENSIVE METABOLIC PANEL
ALT: 9 U/L (ref 0–53)
AST: 12 U/L (ref 0–37)
Albumin: 4.2 g/dL (ref 3.5–5.2)
Alkaline Phosphatase: 51 U/L (ref 39–117)
BUN: 19 mg/dL (ref 6–23)
CO2: 28 mEq/L (ref 19–32)
Calcium: 10 mg/dL (ref 8.4–10.5)
Chloride: 109 mEq/L (ref 96–112)
Creatinine, Ser: 0.98 mg/dL (ref 0.40–1.50)
GFR: 72.21 mL/min (ref >60.00)
Glucose, Bld: 89 mg/dL (ref 70–99)
Potassium: 4.6 mEq/L (ref 3.5–5.1)
Sodium: 144 mEq/L (ref 135–145)
Total Bilirubin: 0.6 mg/dL (ref 0.2–1.2)
Total Protein: 6.8 g/dL (ref 6.0–8.3)

## 2022-10-21 LAB — URINALYSIS
Bilirubin Urine: NEGATIVE
Hgb urine dipstick: NEGATIVE
Ketones, ur: NEGATIVE
Leukocytes,Ua: NEGATIVE
Nitrite: NEGATIVE
Specific Gravity, Urine: >=1.03 — AB (ref 1.000–1.030)
Total Protein, Urine: NEGATIVE
Urine Glucose: NEGATIVE
Urobilinogen, UA: 1 (ref 0.0–1.0)
pH: 6 (ref 5.0–8.0)

## 2022-10-21 LAB — HEMOGLOBIN A1C: Hgb A1c MFr Bld: 5.5 % (ref 4.6–6.5)

## 2022-10-21 LAB — TSH: TSH: 1.17 u[IU]/mL (ref 0.35–5.50)

## 2022-10-21 NOTE — Assessment & Plan Note (Signed)
Stable.  No concerns. °

## 2022-10-21 NOTE — Assessment & Plan Note (Signed)
Well-controlled hypertension with normal blood pressure readings at home. Continue losartan 100 mg daily. BP Readings from Last 3 Encounters:  10/21/22 130/70  09/21/22 (!) 184/98  09/19/22 (!) 160/82

## 2022-10-21 NOTE — Assessment & Plan Note (Signed)
Stable.  CBC done today.

## 2022-10-21 NOTE — Assessment & Plan Note (Signed)
Fall precautions given.  Continue using walking assistive device

## 2022-10-21 NOTE — Assessment & Plan Note (Signed)
Stable.  Continue rosuvastatin 20 mg daily. ?

## 2022-10-21 NOTE — Progress Notes (Signed)
Javier [name] 82 y.o.   Chief Complaint  Patient presents with   Acute Visit    Weak, patient was seen by home nurse, possible anemic, progressively getting worse    HISTORY OF PRESENT ILLNESS: This is a 82 y.o. male accompanied by wife with concerns of progressive generalized weakness Visiting nurse mentioned possibility of anemia No recent flulike symptoms.  No GI bleeding or bleeding anywhere for that matter. Patient has a history of CLL and pancytopenia. Eating normal.  Staying well-hydrated. No new medications. No other associated symptoms. No other complaints or medical concerns today.  HPI   Prior to Admission medications   Medication Sig Start Date End Date Taking? Authorizing Provider  aspirin EC 81 MG tablet Take 81 mg by mouth daily.   Yes [provider]  busPIRone (BUSPAR) 5 MG tablet Take 1 tablet  3 x /day  for Nervousness & Chronic Anxiety 04/13/22  Yes Unk Pinto, MD  Cholecalciferol (VITAMIN D3) 5000 units TABS Take 5,000 Units by mouth daily.   Yes [provider]  fluticasone (FLONASE) 50 MCG/ACT nasal spray Place 2 sprays into both nostrils daily. 12/18/21 12/18/22 Yes Alycia Rossetti, NP  hydrocortisone (ANUSOL-HC) 2.5 % rectal cream Place 1 Application rectally 2 (two) times daily. 04/30/22  Yes Long, Wonda Olds, MD  lipase/protease/amylase (CREON) 12000-38000 units CPEP capsule Take 12,000 Units by mouth 3 (three) times daily with meals.   Yes [provider]  losartan (COZAAR) 100 MG tablet Take 1 tablet (100 mg total) by mouth daily. 09/23/22  Yes Cranford, Kenney Houseman, NP  QUEtiapine (SEROQUEL) 25 MG tablet Take 1 tablet (25 mg total) by mouth at bedtime. 10/08/22  Yes Tyshell Ramberg, Ines Bloomer, MD  rosuvastatin (CRESTOR) 20 MG tablet TAKE 1 TABLET EVERY DAY FOR CHOLESTEROL 07/14/22  Yes Cranford, Tonya, NP  senna-docusate (SENOKOT-S) 8.6-50 MG tablet Take 1 tablet by mouth at bedtime as needed for mild constipation. 04/30/22  Yes Long,  Wonda Olds, MD  sertraline (ZOLOFT) 50 MG tablet Take  1 tablet  Daily  for Mood  / Chronic Anxiety 09/10/21  Yes Unk Pinto, MD  tamsulosin (FLOMAX) 0.4 MG CAPS capsule Take 1 capsule (0.4 mg total) by mouth daily after supper. For prostate. 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 in 2004    Patient Active Problem List   Diagnosis Date Noted   Memory loss 10/29/2021   Pancreatic insufficiency 08/20/2021   Left-sided weakness 08/10/2021   Generalized weakness 08/09/2021   Personal history of CLL (chronic lymphocytic leukemia) 08/09/2021   Pancytopenia ([place]) 08/09/2021   At high risk for falls 04/29/2021   Vascular dementia ([place]) 03/28/2021   Sleeping difficulty 03/28/2021   History of lacunar cerebrovascular accident 01/09/2021   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   BPH with obstruction/lower urinary tract symptoms 01/09/2021   First degree AV block 01/09/2021   Gait abnormality 12/20/2020   BMI 26.0-26.9,adult 03/06/2020   Gastric AVM 03/06/2020   History of colon polyps 03/06/2020   Aortic atherosclerosis ([place]) - per CT 04/2016 08/23/2019   Memory changes 05/25/2019   Anxiety 02/08/2018   CLL (chronic lymphocytic leukemia) ([place]) 11/01/2016   GERD  02/09/2015   Hyperlipidemia    Hypertension    Vitamin D deficiency    Diverticulosis  Past Medical History:  Diagnosis Date   Diverticulosis    Hyperlipidemia    Hypertension    Memory loss    Prediabetes    Vitamin D deficiency     Past Surgical History:  Procedure Laterality Date   NO PAST SURGERIES      Social History   Socioeconomic History   Marital status: Married    Spouse name: Not on file   Number of children: 5   Years of education: 9th grade   Highest education level: Not on file   Occupational History   Occupation: retired  Tobacco Use   Smoking status: Former    Types: Cigarettes    Quit date: 01/17/2003    Years since quitting: 19.7   Smokeless tobacco: Never  Vaping Use   Vaping Use: Never used  Substance and Sexual Activity   Alcohol use: Not Currently    Comment: very rare   Drug use: No   Sexual activity: Not Currently  Other Topics Concern   Not on file  Social History Narrative   Lives at home with his wife.   Right-handed.   At most, one cup caffeine per day.    Social Determinants of Health   Financial Resource Strain: Not on file  Food Insecurity: No Food Insecurity (07/23/2022)   Hunger Vital Sign    Worried About Running Out of Food in the Last Year: Never true    Ran Out of Food in the Last Year: Never true  Transportation Needs: No Transportation Needs (07/23/2022)   PRAPARE - Hydrologist (Medical): No    Lack of Transportation (Non-Medical): No  Physical Activity: Not on file  Stress: Not on file  Social Connections: Not on file  Intimate Partner Violence: Not on file    Family History  Problem Relation Age of Onset   Stroke Mother    Early death Father        Farm/tractor accident   Diabetes Brother    Cirrhosis Brother    Colon cancer Paternal Grandfather      Review of Systems  Constitutional:  Positive for malaise/fatigue. Negative for chills, fever and weight loss.  HENT: Negative.  Negative for congestion and sore throat.   Respiratory: Negative.  Negative for cough and shortness of breath.   Cardiovascular: Negative.  Negative for chest pain, palpitations and leg swelling.  Gastrointestinal: Negative.  Negative for abdominal pain, blood in stool, diarrhea, melena, nausea and vomiting.  Genitourinary: Negative.  Negative for dysuria and hematuria.  Skin: Negative.  Negative for rash.  Neurological:  Positive for weakness. Negative for dizziness and headaches.    Today's Vitals    10/21/22 0905  Weight: 151 lb 4 oz (68.6 kg)  Height: '5\' 11"'$  (1.803 m)   Body mass index is 21.1 kg/m. Wt Readings from Last 3 Encounters:  10/21/22 151 lb 4 oz (68.6 kg)  09/19/22 156 lb (70.8 kg)  08/12/22 150 lb 2 oz (68.1 kg)    Physical Exam Constitutional:      Appearance: Normal appearance.  HENT:     Head: Normocephalic.     Mouth/Throat:     Mouth: Mucous membranes are moist.     Pharynx: Oropharynx is clear.  Eyes:     Extraocular Movements: Extraocular movements intact.     Conjunctiva/sclera: Conjunctivae normal.     Pupils: Pupils are equal, round, and reactive to light.  Cardiovascular:     Rate and Rhythm: Normal rate  and regular rhythm.     Pulses: Normal pulses.     Heart sounds: Normal heart sounds.  Pulmonary:     Effort: Pulmonary effort is normal.     Breath sounds: Normal breath sounds.  Abdominal:     Palpations: Abdomen is soft.     Tenderness: There is no abdominal tenderness.  Musculoskeletal:     Cervical back: No tenderness.     Right lower leg: No edema.     Left lower leg: No edema.  Lymphadenopathy:     Cervical: No cervical adenopathy.  Skin:    General: Skin is warm and dry.     Capillary Refill: Capillary refill takes less than 2 seconds.  Neurological:     General: No focal deficit present.     Mental Status: He is alert and oriented to person, place, and time.  Psychiatric:        Mood and Affect: Mood normal.        Behavior: Behavior normal.      ASSESSMENT & PLAN: A total of 45 minutes was spent with the patient and counseling/coordination of care regarding preparing for this visit, review of most recent office visit notes, review of multiple chronic medical conditions and their management, review of all medications, differential diagnosis of generalized weakness and need for blood work today, education on nutrition, importance of eating well and staying well-hydrated, prognosis, documentation, and need for  follow-up.  Problem List Items Addressed This Visit       Cardiovascular and Mediastinum   Hypertension    Well-controlled hypertension with normal blood pressure readings at home. Continue losartan 100 mg daily. BP Readings from Last 3 Encounters:  10/21/22 130/70  09/21/22 (!) 184/98  09/19/22 (!) 160/82           Nervous and Auditory   Vascular dementia (HCC)    Stable.  No concerns.        Genitourinary   BPH with obstruction/lower urinary tract symptoms    Stable.  Continue tamsulosin 0.4 mg at bedtime        Hematopoietic and Hemostatic   Pancytopenia (HCC)    Stable.  CBC done today.        Other   Hyperlipidemia    Stable.  Continue rosuvastatin 20 mg daily.      CLL (chronic lymphocytic leukemia) (HCC)    Stable.  CBC done today.      At high risk for falls    Fall precautions given.  Continue using walking assistive device      Generalized weakness - Primary    Clinically stable.  Stable vital signs. Unremarkable exam. Differential diagnosis discussed with patient and wife. Blood work done today. Advised to eat well and stay well-hydrated. Fall precautions given. Continue present medications.  No changes. Continue using walking assistive device.      Relevant Orders   CBC with Differential/Platelet   Comprehensive metabolic panel   TSH   Hemoglobin A1c   Urinalysis    Patient Instructions  Weakness Weakness is a lack of strength. You may feel weak all over your body (generalized), or you may feel weak in one part of your body (focal). There are many potential causes of weakness. Sometimes, the cause of your weakness may not be known. Some causes of weakness can be serious, so it is important to see your doctor. Follow these instructions at home: Activity Rest as needed. Try to get enough sleep. Most adults need 7-8 hours of  sleep each night. Talk to your doctor about how much sleep you need. Do exercises, such as arm curls and leg  raises, for 30 minutes at least 2 days a week or as told by your doctor. Think about working with a physical therapist or trainer to help you get stronger. General instructions  Take over-the-counter and prescription medicines only as told by your doctor. Eat a healthy, well-balanced diet. This includes: Proteins to build muscles, such as lean meats and fish. Fresh fruits and vegetables. Carbohydrates to boost energy, such as whole grains. Drink enough fluid to keep your pee (urine) pale yellow. Keep all follow-up visits. Contact a doctor if: Your weakness does not get better or it gets worse. Your weakness affects your ability to: Think clearly. Do your normal daily activities. Get help right away if: You have sudden weakness on one side of your face or body. You have chest pain. You have trouble breathing or shortness of breath. You have problems with how you see (vision). You have trouble talking or swallowing. You have trouble standing or walking. You are light-headed or faint. These symptoms may be an emergency. Get help right away. Call 911. Do not wait to see if the symptoms will go away. Do not drive yourself to the hospital. Summary Weakness is a lack of strength. You may feel weak all over your body or just in one part of your body. There are many potential causes of weakness. Sometimes, the cause of your weakness may not be known. Rest as needed, and try to get enough sleep. Most adults need 7-8 hours of sleep each night. Eat a healthy, well-balanced diet. This information is not intended to replace advice given to you by your health care provider. Make sure you discuss any questions you have with your health care provider. Document Revised: 08/18/2021 Document Reviewed: 08/18/2021 Elsevier Patient Education  2023 [place], MD [facility]

## 2022-10-21 NOTE — Patient Instructions (Signed)
Weakness Weakness is a lack of strength. You may feel weak all over your body (generalized), or you may feel weak in one part of your body (focal). There are many potential causes of weakness. Sometimes, the cause of your weakness may not be known. Some causes of weakness can be serious, so it is important to see your doctor. Follow these instructions at home: Activity Rest as needed. Try to get enough sleep. Most adults need 7-8 hours of sleep each night. Talk to your doctor about how much sleep you need. Do exercises, such as arm curls and leg raises, for 30 minutes at least 2 days a week or as told by your doctor. Think about working with a physical therapist or trainer to help you get stronger. General instructions  Take over-the-counter and prescription medicines only as told by your doctor. Eat a healthy, well-balanced diet. This includes: Proteins to build muscles, such as lean meats and fish. Fresh fruits and vegetables. Carbohydrates to boost energy, such as whole grains. Drink enough fluid to keep your pee (urine) pale yellow. Keep all follow-up visits. Contact a doctor if: Your weakness does not get better or it gets worse. Your weakness affects your ability to: Think clearly. Do your normal daily activities. Get help right away if: You have sudden weakness on one side of your face or body. You have chest pain. You have trouble breathing or shortness of breath. You have problems with how you see (vision). You have trouble talking or swallowing. You have trouble standing or walking. You are light-headed or faint. These symptoms may be an emergency. Get help right away. Call 911. Do not wait to see if the symptoms will go away. Do not drive yourself to the hospital. Summary Weakness is a lack of strength. You may feel weak all over your body or just in one part of your body. There are many potential causes of weakness. Sometimes, the cause of your weakness may not be  known. Rest as needed, and try to get enough sleep. Most adults need 7-8 hours of sleep each night. Eat a healthy, well-balanced diet. This information is not intended to replace advice given to you by your health care provider. Make sure you discuss any questions you have with your health care provider. Document Revised: 08/18/2021 Document Reviewed: 08/18/2021 Elsevier Patient Education  [place].

## 2022-10-21 NOTE — Assessment & Plan Note (Signed)
Clinically stable.  Stable vital signs. Unremarkable exam. Differential diagnosis discussed with patient and wife. Blood work done today. Advised to eat well and stay well-hydrated. Fall precautions given. Continue present medications.  No changes. Continue using walking assistive device.

## 2022-10-21 NOTE — Assessment & Plan Note (Signed)
Stable.  Continue tamsulosin 0.4 mg at bedtime

## 2022-10-23 ENCOUNTER — Other Ambulatory Visit: Payer: Self-pay | Admitting: Nurse Practitioner

## 2022-10-23 ENCOUNTER — Telehealth: Payer: Self-pay | Admitting: Emergency Medicine

## 2022-10-23 ENCOUNTER — Other Ambulatory Visit: Payer: Self-pay | Admitting: Internal Medicine

## 2022-10-23 DIAGNOSIS — F419 Anxiety disorder, unspecified: Secondary | ICD-10-CM

## 2022-10-23 MED ORDER — SERTRALINE HCL 50 MG PO TABS: ORAL_TABLET | ORAL | 3 refills | Status: DC

## 2022-10-23 NOTE — Telephone Encounter (Signed)
New medication sent to patient pharmacy  

## 2022-10-23 NOTE — Telephone Encounter (Signed)
Okay to refill? 

## 2022-10-23 NOTE — Telephone Encounter (Signed)
Patient called requesting a refill on his medication sertraline (ZOLOFT) 50 MG tablet. Patient would like refill to be sent to his pharmacy on file [place], [place]. Best callback number is [phone].

## 2022-12-25 ENCOUNTER — Telehealth: Payer: Self-pay

## 2022-12-25 NOTE — Patient Outreach (Signed)
  Care Coordination   12/25/2022 Name: Javier [name] MRN: [id number] DOB: Jul 13, 1941   Care Coordination Outreach Attempts:  An unsuccessful telephone outreach was attempted today to offer the patient information about available care coordination services as a benefit of their health plan.   Follow Up Plan:  Additional outreach attempts will be made to offer the patient care coordination information and services.   Encounter Outcome:  No Answer   Care Coordination Interventions:  No, not indicated    Thea Silversmith, RN, MSN, BSN, [place] Coordinator [phone]

## 2023-01-27 ENCOUNTER — Encounter: Payer: Medicare HMO | Admitting: Internal Medicine

## 2023-02-10 ENCOUNTER — Other Ambulatory Visit: Payer: Self-pay | Admitting: Internal Medicine

## 2023-02-10 DIAGNOSIS — F419 Anxiety disorder, unspecified: Secondary | ICD-10-CM

## 2023-02-16 ENCOUNTER — Other Ambulatory Visit: Payer: Self-pay | Admitting: Internal Medicine

## 2023-02-16 DIAGNOSIS — F419 Anxiety disorder, unspecified: Secondary | ICD-10-CM

## 2023-02-17 ENCOUNTER — Encounter: Payer: Self-pay | Admitting: Emergency Medicine

## 2023-02-17 ENCOUNTER — Ambulatory Visit (INDEPENDENT_AMBULATORY_CARE_PROVIDER_SITE_OTHER): Payer: Medicare HMO | Admitting: Emergency Medicine

## 2023-02-17 VITALS — BP 130/80 | HR 55 | Temp 98.0°F | Ht 71.0 in | Wt 151.2 lb

## 2023-02-17 DIAGNOSIS — F0150 Vascular dementia without behavioral disturbance: Secondary | ICD-10-CM | POA: Diagnosis not present

## 2023-02-17 DIAGNOSIS — C9110 Chronic lymphocytic leukemia of B-cell type not having achieved remission: Secondary | ICD-10-CM | POA: Diagnosis not present

## 2023-02-17 DIAGNOSIS — R531 Weakness: Secondary | ICD-10-CM | POA: Diagnosis not present

## 2023-02-17 DIAGNOSIS — I10 Essential (primary) hypertension: Secondary | ICD-10-CM | POA: Diagnosis not present

## 2023-02-17 DIAGNOSIS — F419 Anxiety disorder, unspecified: Secondary | ICD-10-CM

## 2023-02-17 DIAGNOSIS — D479 Neoplasm of uncertain behavior of lymphoid, hematopoietic and related tissue, unspecified: Secondary | ICD-10-CM | POA: Diagnosis not present

## 2023-02-17 DIAGNOSIS — N401 Enlarged prostate with lower urinary tract symptoms: Secondary | ICD-10-CM

## 2023-02-17 DIAGNOSIS — I700 Atherosclerosis of aorta: Secondary | ICD-10-CM

## 2023-02-17 DIAGNOSIS — D61818 Other pancytopenia: Secondary | ICD-10-CM | POA: Diagnosis not present

## 2023-02-17 DIAGNOSIS — Z856 Personal history of leukemia: Secondary | ICD-10-CM

## 2023-02-17 DIAGNOSIS — F01B18 Vascular dementia, moderate, with other behavioral disturbance: Secondary | ICD-10-CM

## 2023-02-17 DIAGNOSIS — J439 Emphysema, unspecified: Secondary | ICD-10-CM

## 2023-02-17 DIAGNOSIS — N138 Other obstructive and reflux uropathy: Secondary | ICD-10-CM

## 2023-02-17 LAB — COMPREHENSIVE METABOLIC PANEL
ALT: 10 U/L (ref 0–53)
AST: 14 U/L (ref 0–37)
Albumin: 4 g/dL (ref 3.5–5.2)
Alkaline Phosphatase: 57 U/L (ref 39–117)
BUN: 19 mg/dL (ref 6–23)
CO2: 30 mEq/L (ref 19–32)
Calcium: 9.7 mg/dL (ref 8.4–10.5)
Chloride: 107 mEq/L (ref 96–112)
Creatinine, Ser: 0.93 mg/dL (ref 0.40–1.50)
GFR: 76.72 mL/min (ref >60.00)
Glucose, Bld: 90 mg/dL (ref 70–99)
Potassium: 4.4 mEq/L (ref 3.5–5.1)
Sodium: 143 mEq/L (ref 135–145)
Total Bilirubin: 0.5 mg/dL (ref 0.2–1.2)
Total Protein: 6.7 g/dL (ref 6.0–8.3)

## 2023-02-17 LAB — CBC WITH DIFFERENTIAL/PLATELET
Basophils Absolute: 0 10*3/uL (ref 0.0–0.1)
Basophils Relative: 0.8 % (ref 0.0–3.0)
Eosinophils Absolute: 0 10*3/uL (ref 0.0–0.7)
Eosinophils Relative: 0.6 % (ref 0.0–5.0)
HCT: 38.1 % — ABNORMAL LOW (ref 39.0–52.0)
Hemoglobin: 12.1 g/dL — ABNORMAL LOW (ref 13.0–17.0)
Lymphocytes Relative: 32.5 % (ref 12.0–46.0)
Lymphs Abs: 2 10*3/uL (ref 0.7–4.0)
MCHC: 31.9 g/dL (ref 30.0–36.0)
MCV: 94.1 fl (ref 78.0–100.0)
Monocytes Absolute: 0.5 10*3/uL (ref 0.1–1.0)
Monocytes Relative: 7.7 % (ref 3.0–12.0)
Neutro Abs: 3.6 10*3/uL (ref 1.4–7.7)
Neutrophils Relative %: 58.4 % (ref 43.0–77.0)
Platelets: 158 10*3/uL (ref 150.0–400.0)
RBC: 4.05 Mil/uL — ABNORMAL LOW (ref 4.22–5.81)
RDW: 13.7 % (ref 11.5–15.5)
WBC: 6.2 10*3/uL (ref 4.0–10.5)

## 2023-02-17 LAB — VITAMIN B12: Vitamin B-12: 363 pg/mL (ref 211–911)

## 2023-02-17 MED ORDER — BUSPIRONE HCL 5 MG PO TABS
ORAL_TABLET | ORAL | 1 refills | Status: DC
Start: 2023-02-17 — End: 2023-10-05

## 2023-02-17 NOTE — Assessment & Plan Note (Signed)
Stable chronic condition 

## 2023-02-17 NOTE — Addendum Note (Signed)
Addended by: Evie Lacks on: 02/17/2023 04:36 PM   Modules accepted: Level of Service

## 2023-02-17 NOTE — Assessment & Plan Note (Signed)
Stable.  Continues Seroquel 25 mg at bedtime

## 2023-02-17 NOTE — Assessment & Plan Note (Signed)
Stable.  CBC done today. 

## 2023-02-17 NOTE — Assessment & Plan Note (Signed)
Chronic.  Stable. Blood work done today. Eating well.  Good appetite. Sleeping well.  No recent falls.  Uses cane for ambulation help. Moving bowels well and urinating without problems

## 2023-02-17 NOTE — Assessment & Plan Note (Signed)
Takes BuSpar 5 mg 3 times daily as needed

## 2023-02-17 NOTE — Assessment & Plan Note (Signed)
Well-controlled hypertension Continue losartan 100 mg daily 

## 2023-02-17 NOTE — Progress Notes (Signed)
Javier [name] 82 y.o.   Chief Complaint  Patient presents with   Medical Management of Chronic Issues    f/u appt,     HISTORY OF PRESENT ILLNESS: This is a 82 y.o. male here for follow-up of chronic medical problems.  Accompanied by wife. Other than generalized occasional weakness, doing well No other complaints or medical concerns today.  HPI   Prior to Admission medications   Medication Sig Start Date End Date Taking? Authorizing Provider  aspirin EC 81 MG tablet Take 81 mg by mouth daily.   Yes [provider]  busPIRone (BUSPAR) 5 MG tablet Take 1 tablet  3 x /day  for Nervousness & Chronic Anxiety 04/13/22  Yes Lucky Cowboy, MD  Cholecalciferol (VITAMIN D3) 5000 units TABS Take 5,000 Units by mouth daily.   Yes [provider]  hydrocortisone (ANUSOL-HC) 2.5 % rectal cream Place 1 Application rectally 2 (two) times daily. 04/30/22  Yes Long, Arlyss Repress, MD  losartan (COZAAR) 100 MG tablet Take 1 tablet (100 mg total) by mouth daily. 09/23/22  Yes Cranford, Archie Patten, NP  QUEtiapine (SEROQUEL) 25 MG tablet Take 1 tablet (25 mg total) by mouth at bedtime. 10/08/22  Yes Librada Castronovo, Eilleen Kempf, MD  rosuvastatin (CRESTOR) 20 MG tablet TAKE 1 TABLET EVERY DAY FOR CHOLESTEROL 07/14/22  Yes Cranford, Tonya, NP  senna-docusate (SENOKOT-S) 8.6-50 MG tablet Take 1 tablet by mouth at bedtime as needed for mild constipation. 04/30/22  Yes Long, Arlyss Repress, MD  tamsulosin (FLOMAX) 0.4 MG CAPS capsule Take 1 capsule (0.4 mg total) by mouth daily after supper. For prostate. 01/09/21  Yes Judd Gaudier, NP  fluticasone (FLONASE) 50 MCG/ACT nasal spray Place 2 sprays into both nostrils daily. 12/18/21 12/18/22  Raynelle Dick, NP  lipase/protease/amylase (CREON) 12000-38000 units CPEP capsule Take 12,000 Units by mouth 3 (three) times daily with meals.    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]     Positive test in 2004    Patient Active Problem List   Diagnosis Date Noted   Memory loss 10/29/2021   Pancreatic insufficiency 08/20/2021   Left-sided weakness 08/10/2021   Generalized weakness 08/09/2021   Personal history of CLL (chronic lymphocytic leukemia) 08/09/2021   Pancytopenia (HCC) 08/09/2021   At high risk for falls 04/29/2021   Vascular dementia (HCC) 03/28/2021   Sleeping difficulty 03/28/2021   History of lacunar cerebrovascular accident 01/09/2021   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   BPH with obstruction/lower urinary tract symptoms 01/09/2021   First degree AV block 01/09/2021   Gait abnormality 12/20/2020   BMI 26.0-26.9,adult 03/06/2020   Gastric AVM 03/06/2020   History of colon polyps 03/06/2020   Aortic atherosclerosis (HCC) - per CT 04/2016 08/23/2019   Memory changes 05/25/2019   Anxiety 02/08/2018   CLL (chronic lymphocytic leukemia) (HCC) 11/01/2016   GERD  02/09/2015   Hyperlipidemia    Hypertension    Vitamin D deficiency    Diverticulosis     Past Medical History:  Diagnosis Date   Diverticulosis    Hyperlipidemia    Hypertension    Memory loss    Prediabetes    Vitamin D deficiency  Past Surgical History:  Procedure Laterality Date   NO PAST SURGERIES      Social History   Socioeconomic History   Marital status: Married    Spouse name: Not on file   Number of children: 5   Years of education: 9th grade   Highest education level: Not on file  Occupational History   Occupation: retired  Tobacco Use   Smoking status: Former    Types: Cigarettes    Quit date: 01/17/2003    Years since quitting: 20.0   Smokeless tobacco: Never  Vaping Use   Vaping Use: Never used  Substance and Sexual  Activity   Alcohol use: Not Currently    Comment: very rare   Drug use: No   Sexual activity: Not Currently  Other Topics Concern   Not on file  Social History Narrative   Lives at home with his wife.   Right-handed.   At most, one cup caffeine per day.    Social Determinants of Health   Financial Resource Strain: Not on file  Food Insecurity: No Food Insecurity (07/23/2022)   Hunger Vital Sign    Worried About Running Out of Food in the Last Year: Never true    Ran Out of Food in the Last Year: Never true  Transportation Needs: No Transportation Needs (07/23/2022)   PRAPARE - Administrator, Civil Service (Medical): No    Lack of Transportation (Non-Medical): No  Physical Activity: Not on file  Stress: Not on file  Social Connections: Not on file  Intimate Partner Violence: Not on file    Family History  Problem Relation Age of Onset   Stroke Mother    Early death Father        Farm/tractor accident   Diabetes Brother    Cirrhosis Brother    Colon cancer Paternal Grandfather      Review of Systems  Constitutional: Negative.  Negative for chills and fever.  HENT: Negative.  Negative for congestion and sore throat.   Respiratory: Negative.  Negative for cough and shortness of breath.   Cardiovascular: Negative.  Negative for chest pain and palpitations.  Gastrointestinal:  Negative for abdominal pain, diarrhea, nausea and vomiting.  Genitourinary: Negative.  Negative for dysuria and hematuria.  Skin: Negative.  Negative for rash.  Neurological:  Positive for weakness. Negative for dizziness and headaches.  All other systems reviewed and are negative.   Vitals:   02/17/23 1408  BP: 130/80  Pulse: (!) 55  Temp: 98 F (36.7 C)  SpO2: 97%    Physical Exam Vitals reviewed.  Constitutional:      Appearance: Normal appearance.  HENT:     Head: Normocephalic.     Mouth/Throat:     Mouth: Mucous membranes are moist.     Pharynx: Oropharynx is  clear.  Eyes:     Extraocular Movements: Extraocular movements intact.     Pupils: Pupils are equal, round, and reactive to light.  Cardiovascular:     Rate and Rhythm: Normal rate and regular rhythm.     Pulses: Normal pulses.     Heart sounds: Normal heart sounds.  Pulmonary:     Effort: Pulmonary effort is normal.     Breath sounds: Normal breath sounds.  Abdominal:     Palpations: Abdomen is soft.     Tenderness: There is no abdominal tenderness.  Musculoskeletal:     Cervical back: No tenderness.  Lymphadenopathy:     Cervical: No cervical adenopathy.  Skin:    General: Skin is warm and dry.     Capillary Refill: Capillary refill takes less than 2 seconds.  Neurological:     General: No focal deficit present.     Mental Status: He is alert and oriented to person, place, and time.  Psychiatric:        Mood and Affect: Mood normal.        Behavior: Behavior normal.      ASSESSMENT & PLAN: A total of 47 minutes was spent with the patient and counseling/coordination of care regarding preparing for this visit, review of most recent office visit notes, review of multiple chronic medical conditions under management, review of all medications, review of most recent blood work results, education on nutrition, review of health maintenance items, prognosis, documentation, need for follow-up.  Problem List Items Addressed This Visit       Cardiovascular and Mediastinum   Hypertension - Primary    Well-controlled hypertension. Continue losartan 100 mg daily      Relevant Orders   Comprehensive metabolic panel   Aortic atherosclerosis (HCC) - per CT 04/2016    Stable chronic condition Continue rosuvastatin 20 mg daily        Respiratory   Pulmonary emphysema, unspecified emphysema type (HCC)    Stable chronic condition        Nervous and Auditory   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)    Stable.  Continues Seroquel 25 mg at bedtime       Relevant Medications   busPIRone (BUSPAR) 5 MG tablet     Genitourinary   BPH with obstruction/lower urinary tract symptoms    Stable.  No urinary symptoms.  Continues Flomax 0.4 mg daily        Hematopoietic and Hemostatic   Pancytopenia (HCC)    Stable.  CBC done today.      Relevant Orders   CBC with Differential/Platelet     Other   CLL (chronic lymphocytic leukemia) (HCC)   Relevant Orders   CBC with Differential/Platelet   Anxiety    Takes BuSpar 5 mg 3 times daily as needed      Relevant Medications   busPIRone (BUSPAR) 5 MG tablet   Generalized weakness    Chronic.  Stable. Blood work done today. Eating well.  Good appetite. Sleeping well.  No recent falls.  Uses cane for ambulation help. Moving bowels well and urinating without problems      Relevant Orders   CBC with Differential/Platelet   Comprehensive metabolic panel   Hemoglobin A1c   Vitamin B12   Personal history of CLL (chronic lymphocytic leukemia)    Stable.  CBC done today.      Neoplasm of uncertain behavior of lymphoid, hematopoietic and related tissue, unspecified (HCC)    Stable.  No concerns.      Patient Instructions  Health Maintenance After Age 72 After age 76, you are at a higher risk for certain long-term diseases and infections as well as injuries from falls. Falls are a major cause of broken bones and head injuries in people who are older than age 41. Getting regular preventive care can help to keep you healthy and well. Preventive care includes getting regular testing and making lifestyle changes as recommended by your health care provider. Talk with your health care provider about: Which screenings and tests you should have. A screening is a test that checks for a disease when you have no symptoms. A diet and  exercise plan that is right for you. What should I know about screenings and tests to prevent falls? Screening and testing are the best ways to find a health problem  early. Early diagnosis and treatment give you the best chance of managing medical conditions that are common after age 62. Certain conditions and lifestyle choices may make you more likely to have a fall. Your health care provider may recommend: Regular vision checks. Poor vision and conditions such as cataracts can make you more likely to have a fall. If you wear glasses, make sure to get your prescription updated if your vision changes. Medicine review. Work with your health care provider to regularly review all of the medicines you are taking, including over-the-counter medicines. Ask your health care provider about any side effects that may make you more likely to have a fall. Tell your health care provider if any medicines that you take make you feel dizzy or sleepy. Strength and balance checks. Your health care provider may recommend certain tests to check your strength and balance while standing, walking, or changing positions. Foot health exam. Foot pain and numbness, as well as not wearing proper footwear, can make you more likely to have a fall. Screenings, including: Osteoporosis screening. Osteoporosis is a condition that causes the bones to get weaker and break more easily. Blood pressure screening. Blood pressure changes and medicines to control blood pressure can make you feel dizzy. Depression screening. You may be more likely to have a fall if you have a fear of falling, feel depressed, or feel unable to do activities that you used to do. Alcohol use screening. Using too much alcohol can affect your balance and may make you more likely to have a fall. Follow these instructions at home: Lifestyle Do not drink alcohol if: Your health care provider tells you not to drink. If you drink alcohol: Limit how much you have to: 0-1 drink a day for women. 0-2 drinks a day for men. Know how much alcohol is in your drink. In the [place] one drink equals one 12 oz bottle of beer (355 mL), one 5 oz  glass of wine (148 mL), or one 1 oz glass of hard liquor (44 mL). Do not use any products that contain nicotine or tobacco. These products include cigarettes, chewing tobacco, and vaping devices, such as e-cigarettes. If you need help quitting, ask your health care provider. Activity  Follow a regular exercise program to stay fit. This will help you maintain your balance. Ask your health care provider what types of exercise are appropriate for you. If you need a cane or walker, use it as recommended by your health care provider. Wear supportive shoes that have nonskid soles. Safety  Remove any tripping hazards, such as rugs, cords, and clutter. Install safety equipment such as grab bars in bathrooms and safety rails on stairs. Keep rooms and walkways well-lit. General instructions Talk with your health care provider about your risks for falling. Tell your health care provider if: You fall. Be sure to tell your health care provider about all falls, even ones that seem minor. You feel dizzy, tiredness (fatigue), or off-balance. Take over-the-counter and prescription medicines only as told by your health care provider. These include supplements. Eat a healthy diet and maintain a healthy weight. A healthy diet includes low-fat dairy products, low-fat (lean) meats, and fiber from whole grains, beans, and lots of fruits and vegetables. Stay current with your vaccines. Schedule regular health, dental, and eye exams. Summary  Having a healthy lifestyle and getting preventive care can help to protect your health and wellness after age 71. Screening and testing are the best way to find a health problem early and help you avoid having a fall. Early diagnosis and treatment give you the best chance for managing medical conditions that are more common for people who are older than age 69. Falls are a major cause of broken bones and head injuries in people who are older than age 69. Take precautions to  prevent a fall at home. Work with your health care provider to learn what changes you can make to improve your health and wellness and to prevent falls. This information is not intended to replace advice given to you by your health care provider. Make sure you discuss any questions you have with your health care provider. Document Revised: 02/04/2021 Document Reviewed: 02/04/2021 Elsevier Patient Education  2023 Elsevier Inc.     Edwina Barth, MD [facility]

## 2023-02-17 NOTE — Assessment & Plan Note (Signed)
Stable.  No urinary symptoms.  Continues Flomax 0.4 mg daily

## 2023-02-17 NOTE — Assessment & Plan Note (Signed)
Stable.  No concerns. °

## 2023-02-17 NOTE — Patient Instructions (Signed)
Health Maintenance After Age 82 After age 82, you are at a higher risk for certain long-term diseases and infections as well as injuries from falls. Falls are a major cause of broken bones and head injuries in people who are older than age 82. Getting regular preventive care can help to keep you healthy and well. Preventive care includes getting regular testing and making lifestyle changes as recommended by your health care provider. Talk with your health care provider about: Which screenings and tests you should have. A screening is a test that checks for a disease when you have no symptoms. A diet and exercise plan that is right for you. What should I know about screenings and tests to prevent falls? Screening and testing are the best ways to find a health problem early. Early diagnosis and treatment give you the best chance of managing medical conditions that are common after age 82. Certain conditions and lifestyle choices may make you more likely to have a fall. Your health care provider may recommend: Regular vision checks. Poor vision and conditions such as cataracts can make you more likely to have a fall. If you wear glasses, make sure to get your prescription updated if your vision changes. Medicine review. Work with your health care provider to regularly review all of the medicines you are taking, including over-the-counter medicines. Ask your health care provider about any side effects that may make you more likely to have a fall. Tell your health care provider if any medicines that you take make you feel dizzy or sleepy. Strength and balance checks. Your health care provider may recommend certain tests to check your strength and balance while standing, walking, or changing positions. Foot health exam. Foot pain and numbness, as well as not wearing proper footwear, can make you more likely to have a fall. Screenings, including: Osteoporosis screening. Osteoporosis is a condition that causes  the bones to get weaker and break more easily. Blood pressure screening. Blood pressure changes and medicines to control blood pressure can make you feel dizzy. Depression screening. You may be more likely to have a fall if you have a fear of falling, feel depressed, or feel unable to do activities that you used to do. Alcohol use screening. Using too much alcohol can affect your balance and may make you more likely to have a fall. Follow these instructions at home: Lifestyle Do not drink alcohol if: Your health care provider tells you not to drink. If you drink alcohol: Limit how much you have to: 0-1 drink a day for women. 0-2 drinks a day for men. Know how much alcohol is in your drink. In the [place] one drink equals one 12 oz bottle of beer (355 mL), one 5 oz glass of wine (148 mL), or one 1 oz glass of hard liquor (44 mL). Do not use any products that contain nicotine or tobacco. These products include cigarettes, chewing tobacco, and vaping devices, such as e-cigarettes. If you need help quitting, ask your health care provider. Activity  Follow a regular exercise program to stay fit. This will help you maintain your balance. Ask your health care provider what types of exercise are appropriate for you. If you need a cane or walker, use it as recommended by your health care provider. Wear supportive shoes that have nonskid soles. Safety  Remove any tripping hazards, such as rugs, cords, and clutter. Install safety equipment such as grab bars in bathrooms and safety rails on stairs. Keep rooms and walkways   well-lit. General instructions Talk with your health care provider about your risks for falling. Tell your health care provider if: You fall. Be sure to tell your health care provider about all falls, even ones that seem minor. You feel dizzy, tiredness (fatigue), or off-balance. Take over-the-counter and prescription medicines only as told by your health care provider. These include  supplements. Eat a healthy diet and maintain a healthy weight. A healthy diet includes low-fat dairy products, low-fat (lean) meats, and fiber from whole grains, beans, and lots of fruits and vegetables. Stay current with your vaccines. Schedule regular health, dental, and eye exams. Summary Having a healthy lifestyle and getting preventive care can help to protect your health and wellness after age 82. Screening and testing are the best way to find a health problem early and help you avoid having a fall. Early diagnosis and treatment give you the best chance for managing medical conditions that are more common for people who are older than age 82. Falls are a major cause of broken bones and head injuries in people who are older than age 82. Take precautions to prevent a fall at home. Work with your health care provider to learn what changes you can make to improve your health and wellness and to prevent falls. This information is not intended to replace advice given to you by your health care provider. Make sure you discuss any questions you have with your health care provider. Document Revised: 02/04/2021 Document Reviewed: 02/04/2021 Elsevier Patient Education  2023 Elsevier Inc.  

## 2023-02-17 NOTE — Assessment & Plan Note (Signed)
Stable chronic condition Continue rosuvastatin 20 mg daily

## 2023-02-18 LAB — HEMOGLOBIN A1C: Hgb A1c MFr Bld: 5.3 % (ref 4.6–6.5)

## 2023-03-02 ENCOUNTER — Telehealth: Payer: Self-pay | Admitting: Emergency Medicine

## 2023-03-02 NOTE — Telephone Encounter (Signed)
Prescription Request  03/02/2023  LOV: 02/17/2023  What is the name of the medication or equipment? QUEtiapine (SEROQUEL) 25 MG tablet   Have you contacted your pharmacy to request a refill? No   Which pharmacy would you like this sent to?      [id number] - [place], [place] - [address] [place] [address] Phone: [phone] Fax: [phone]   Patient notified that their request  Insert SmartText    is being sent to the clinical staff for review and that they should receive a response within 2 business days.   Please advise at Mobile [phone] (mobile)

## 2023-03-02 NOTE — Telephone Encounter (Signed)
Called patient's wife and left message to inform her that a prescription for the Seroquel was sent to the pharmacy on 09/2022 for a year supply. She will need to call the pharmacy to request a refill.

## 2023-03-23 ENCOUNTER — Telehealth: Payer: Self-pay | Admitting: Emergency Medicine

## 2023-03-23 NOTE — Telephone Encounter (Signed)
Patient's wife states that patient is going to need to be placed in a home.  Please call patient's wife:  [phone]  -  Jim Lundin  Wife is in the hospital and the doctor told her that she could not go home until she has help with him.

## 2023-03-24 ENCOUNTER — Emergency Department (HOSPITAL_COMMUNITY)
Admission: EM | Admit: 2023-03-24 | Discharge: 2023-03-24 | Disposition: A | Discharge status: Home / Self Care | Payer: Medicare HMO | Attending: Emergency Medicine | Admitting: Emergency Medicine

## 2023-03-24 ENCOUNTER — Encounter (HOSPITAL_COMMUNITY): Payer: Self-pay

## 2023-03-24 ENCOUNTER — Emergency Department (HOSPITAL_COMMUNITY): Payer: Medicare HMO

## 2023-03-24 ENCOUNTER — Other Ambulatory Visit: Payer: Self-pay

## 2023-03-24 DIAGNOSIS — Z79899 Other long term (current) drug therapy: Secondary | ICD-10-CM | POA: Insufficient documentation

## 2023-03-24 DIAGNOSIS — R1031 Right lower quadrant pain: Secondary | ICD-10-CM | POA: Insufficient documentation

## 2023-03-24 DIAGNOSIS — R1111 Vomiting without nausea: Secondary | ICD-10-CM | POA: Diagnosis not present

## 2023-03-24 DIAGNOSIS — Z7982 Long term (current) use of aspirin: Secondary | ICD-10-CM | POA: Diagnosis not present

## 2023-03-24 DIAGNOSIS — K828 Other specified diseases of gallbladder: Secondary | ICD-10-CM | POA: Diagnosis not present

## 2023-03-24 DIAGNOSIS — R112 Nausea with vomiting, unspecified: Secondary | ICD-10-CM | POA: Diagnosis not present

## 2023-03-24 DIAGNOSIS — I10 Essential (primary) hypertension: Secondary | ICD-10-CM | POA: Diagnosis not present

## 2023-03-24 DIAGNOSIS — N400 Enlarged prostate without lower urinary tract symptoms: Secondary | ICD-10-CM | POA: Diagnosis not present

## 2023-03-24 DIAGNOSIS — R110 Nausea: Secondary | ICD-10-CM | POA: Diagnosis not present

## 2023-03-24 LAB — URINALYSIS, ROUTINE W REFLEX MICROSCOPIC
Bilirubin Urine: NEGATIVE
Glucose, UA: NEGATIVE mg/dL
Hgb urine dipstick: NEGATIVE
Ketones, ur: 5 mg/dL — AB
Leukocytes,Ua: NEGATIVE
Nitrite: NEGATIVE
Protein, ur: NEGATIVE mg/dL
Specific Gravity, Urine: 1.023 (ref 1.005–1.030)
pH: 5 (ref 5.0–8.0)

## 2023-03-24 LAB — CBC WITH DIFFERENTIAL/PLATELET
Abs Immature Granulocytes: 0.01 10*3/uL (ref 0.00–0.07)
Basophils Absolute: 0 10*3/uL (ref 0.0–0.1)
Basophils Relative: 1 %
Eosinophils Absolute: 0 10*3/uL (ref 0.0–0.5)
Eosinophils Relative: 0 %
HCT: 38.7 % — ABNORMAL LOW (ref 39.0–52.0)
Hemoglobin: 12.6 g/dL — ABNORMAL LOW (ref 13.0–17.0)
Immature Granulocytes: 0 %
Lymphocytes Relative: 33 %
Lymphs Abs: 1.6 10*3/uL (ref 0.7–4.0)
MCH: 30.9 pg (ref 26.0–34.0)
MCHC: 32.6 g/dL (ref 30.0–36.0)
MCV: 94.9 fL (ref 80.0–100.0)
Monocytes Absolute: 0.6 10*3/uL (ref 0.1–1.0)
Monocytes Relative: 12 %
Neutro Abs: 2.6 10*3/uL (ref 1.7–7.7)
Neutrophils Relative %: 54 %
Platelets: 155 10*3/uL (ref 150–400)
RBC: 4.08 MIL/uL — ABNORMAL LOW (ref 4.22–5.81)
RDW: 13.1 % (ref 11.5–15.5)
WBC: 4.8 10*3/uL (ref 4.0–10.5)
nRBC: 0 % (ref 0.0–0.2)

## 2023-03-24 LAB — LIPASE, BLOOD: Lipase: 26 U/L (ref 11–51)

## 2023-03-24 LAB — COMPREHENSIVE METABOLIC PANEL
ALT: 11 U/L (ref 0–44)
AST: 16 U/L (ref 15–41)
Albumin: 4.1 g/dL (ref 3.5–5.0)
Alkaline Phosphatase: 52 U/L (ref 38–126)
Anion gap: 9 (ref 5–15)
BUN: 14 mg/dL (ref 8–23)
CO2: 24 mmol/L (ref 22–32)
Calcium: 9.7 mg/dL (ref 8.9–10.3)
Chloride: 104 mmol/L (ref 98–111)
Creatinine, Ser: 1.03 mg/dL (ref 0.61–1.24)
GFR, Estimated: >60 mL/min (ref >60)
Glucose, Bld: 96 mg/dL (ref 70–99)
Potassium: 3.9 mmol/L (ref 3.5–5.1)
Sodium: 137 mmol/L (ref 135–145)
Total Bilirubin: 1.1 mg/dL (ref 0.3–1.2)
Total Protein: 6.6 g/dL (ref 6.5–8.1)

## 2023-03-24 MED ORDER — IOHEXOL 350 MG/ML SOLN
75.0000 mL | Freq: Once | INTRAVENOUS | Status: AC | PRN
  Administered 2023-03-24: 75 mL via INTRAVENOUS

## 2023-03-24 MED ORDER — ONDANSETRON HCL 4 MG/2ML IJ SOLN
4.0000 mg | Freq: Once | INTRAMUSCULAR | Status: DC
  Filled 2023-03-24: qty 2

## 2023-03-24 MED ORDER — ONDANSETRON HCL 4 MG PO TABS: 4.0000 mg | ORAL_TABLET | Freq: Four times a day (QID) | ORAL | 0 refills | Status: DC

## 2023-03-24 NOTE — ED Provider Notes (Signed)
[place] EMERGENCY DEPARTMENT AT [facility] Provider Note   CSN: [id number] Arrival date & time: 03/24/23  1622     History  Chief Complaint  Patient presents with   Nausea    Javier [name] is a 82 y.o. male history of CLL not on chemo/radiation, pancytopenia, pulmonary emphysema, diverticulosis, GERD presented for nausea and vomiting that began today.  Patient states that he has right lower quadrant pain and states he still has appendix.  Patient had a few episodes of nonbloody emesis at home and continues to feel nauseous.  Patient denies any fevers, back pain, dysuria, hematuria, flank pain, chest pain, shortness of breath, testicular pain.   Home Medications Prior to Admission medications   Medication Sig Start Date End Date Taking? Authorizing Provider  ondansetron (ZOFRAN) 4 MG tablet Take 1 tablet (4 mg total) by mouth every 6 (six) hours. 03/24/23  Yes Netta Corrigan, PA-C  aspirin EC 81 MG tablet Take 81 mg by mouth daily.    [provider]  busPIRone (BUSPAR) 5 MG tablet Take 1 tablet  3 x /day  for Nervousness & Chronic Anxiety 02/17/23   Georgina Quint, MD  Cholecalciferol (VITAMIN D3) 5000 units TABS Take 5,000 Units by mouth daily.    [provider]  fluticasone (FLONASE) 50 MCG/ACT nasal spray Place 2 sprays into both nostrils daily. 12/18/21 12/18/22  Raynelle Dick, NP  hydrocortisone (ANUSOL-HC) 2.5 % rectal cream Place 1 Application rectally 2 (two) times daily. 04/30/22   Long, Arlyss Repress, MD  lipase/protease/amylase (CREON) 12000-38000 units CPEP capsule Take 12,000 Units by mouth 3 (three) times daily with meals.    [provider]  losartan (COZAAR) 100 MG tablet Take 1 tablet (100 mg total) by mouth daily. 09/23/22   Adela Glimpse, NP  QUEtiapine (SEROQUEL) 25 MG tablet Take 1 tablet (25 mg total) by mouth at bedtime. 10/08/22   Georgina Quint, MD  rosuvastatin (CRESTOR) 20 MG tablet TAKE 1 TABLET EVERY DAY  FOR CHOLESTEROL 07/14/22   Cranford, Archie Patten, NP  senna-docusate (SENOKOT-S) 8.6-50 MG tablet Take 1 tablet by mouth at bedtime as needed for mild constipation. 04/30/22   Long, Arlyss Repress, MD  sertraline (ZOLOFT) 50 MG tablet Take  1 tablet  Daily  for Mood  / Chronic Anxiety 10/23/22   Georgina Quint, MD  tamsulosin (FLOMAX) 0.4 MG CAPS capsule Take 1 capsule (0.4 mg total) by mouth daily after supper. For prostate. 01/09/21   Judd Gaudier, NP      Allergies    Penicillins and Ppd [tuberculin purified protein derivative]    Review of Systems   Review of Systems See HPI Physical Exam Updated Vital Signs BP (!) 186/81   Pulse 72   Temp 98.5 F (36.9 C) (Oral)   Resp 15   Ht 5\' 11"  (1.803 m)   Wt 68 kg   SpO2 98%   BMI 20.92 kg/m  Physical Exam Vitals reviewed.  Constitutional:      General: He is not in acute distress. HENT:     Head: Normocephalic and atraumatic.  Eyes:     Extraocular Movements: Extraocular movements intact.     Conjunctiva/sclera: Conjunctivae normal.     Pupils: Pupils are equal, round, and reactive to light.  Cardiovascular:     Rate and Rhythm: Normal rate and regular rhythm.     Pulses: Normal pulses.     Heart sounds: Normal heart sounds.     Comments: 2+  bilateral radial/dorsalis pedis pulses with regular rate Pulmonary:     Effort: Pulmonary effort is normal. No respiratory distress.     Breath sounds: Normal breath sounds.  Abdominal:     Palpations: Abdomen is soft.     Tenderness: There is abdominal tenderness (Right lower quad). There is no guarding or rebound.  Musculoskeletal:        General: Normal range of motion.     Cervical back: Normal range of motion and neck supple.     Comments: 5 out of 5 bilateral grip/leg extension strength  Skin:    General: Skin is warm and dry.     Capillary Refill: Capillary refill takes less than 2 seconds.  Neurological:     General: No focal deficit present.     Mental Status: He is alert and  oriented to person, place, and time.     Comments: Sensation intact in all 4 limbs  Psychiatric:        Mood and Affect: Mood normal.     ED Results / Procedures / Treatments   Labs (all labs ordered are listed, but only abnormal results are displayed) Labs Reviewed  CBC WITH DIFFERENTIAL/PLATELET - Abnormal; Notable for the following components:      Result Value   RBC 4.08 (*)    Hemoglobin 12.6 (*)    HCT 38.7 (*)    All other components within normal limits  URINALYSIS, ROUTINE W REFLEX MICROSCOPIC - Abnormal; Notable for the following components:   Ketones, ur 5 (*)    All other components within normal limits  COMPREHENSIVE METABOLIC PANEL  LIPASE, BLOOD    EKG None  Radiology CT ABDOMEN PELVIS W CONTRAST  Result Date: 03/24/2023 CLINICAL DATA:  Right lower quadrant pain EXAM: CT ABDOMEN AND PELVIS WITH CONTRAST TECHNIQUE: Multidetector CT imaging of the abdomen and pelvis was performed using the standard protocol following bolus administration of intravenous contrast. RADIATION DOSE REDUCTION: This exam was performed according to the departmental dose-optimization program which includes automated exposure control, adjustment of the mA and/or kV according to patient size and/or use of iterative reconstruction technique. CONTRAST:  75mL OMNIPAQUE IOHEXOL 350 MG/ML SOLN COMPARISON:  04/30/2022 FINDINGS: Lower chest: No pleural or pericardial effusion. Visualized lung bases clear. Hepatobiliary: No focal liver lesion or ductal dilatation. Physiologic distention of the gallbladder without calcified gallstone. Pancreas: Unremarkable. No pancreatic ductal dilatation or surrounding inflammatory changes. Spleen: Normal in size without focal abnormality. Adrenals/Urinary Tract: No adrenal mass. Symmetric renal parenchymal enhancement. 1.3 cm left lower pole cyst as before, no follow-up indicated. Urinary bladder nondistended. Stomach/Bowel: Stomach incompletely distended, without acute  finding. Small bowel decompressed. Appendix not identified. Fecal material and gas partially distend the colon, without acute finding. Vascular/Lymphatic: Scattered aortoiliac calcified plaque without aneurysm. Portal vein patent. No abdominal or pelvic adenopathy. Reproductive: Prostate enlargement with central coarse calcifications. Other: Bilateral pelvic phleboliths.  No ascites.  No free air. Musculoskeletal: Mild multilevel spondylitic change in the lumbar spine. No fracture or worrisome bone lesion. IMPRESSION: 1. No acute findings. 2.  Aortic Atherosclerosis (ICD10-I70.0). Electronically Signed   By: Corlis Leak M.D.   On: 03/24/2023 18:37    Procedures Procedures    Medications Ordered in ED Medications  ondansetron (ZOFRAN) injection 4 mg (has no administration in time range)  iohexol (OMNIPAQUE) 350 MG/ML injection 75 mL (75 mLs Intravenous Contrast Given 03/24/23 1830)    ED Course/ Medical Decision Making/ A&P  Medical Decision Making Amount and/or Complexity of Data Reviewed Labs: ordered. Radiology: ordered.  Risk Prescription drug management.   Javier [name] 82 y.o. presented today for nausea and vomiting. Working DDx that I considered at this time includes, but not limited to, gastroenteritis, colitis, small bowel obstruction, appendicitis, cholecystitis, pancreatitis, nephrolithiasis, AAA, UTI, pyelonephritis, testicular torsion.  R/o DDx: gastroenteritis, colitis, small bowel obstruction, appendicitis, cholecystitis, pancreatitis, nephrolithiasis, AAA, UTI, pyelonephritis, testicular torsion: These are considered less likely due to history of present illness and physical exam findings.  Review of prior external notes: 03/19/2023 office visit  Unique Tests and My Interpretation:  CBC with differential: Unremarkable CMP: Unremarkable Lipase: Unremarkable UA: Unremarkable EKG: Rate, rhythm, axis, intervals all examined and without  medically relevant abnormality. ST segments without concerns for elevations CT Abd/Pelvis with contrast: No acute intra-abdominal changes  Discussion with Independent Historian:  Daughter and wife  Discussion of Management of Tests: None  Risk: Medium: prescription drug management  Risk Stratification Score: None  Plan: On exam patient was in no acute distress and stable vitals.  Patient's physical exam showed the patient was tender in the right lower quadrant states he still has his appendix.  Labs will be ordered along with CT scan to further evaluate for possible appendicitis.  Patient will be given Zofran for his nausea and vomiting.  Patient stable this time.  Patient's labs and imaging came back reassuring and patient is had stable vitals throughout his ED stay.  Patient has not had any episodes of emesis after Zofran.  I spoke to the daughter and wife along with the patient and they are comfortable with discharge with primary care follow-up.  Patient was given Zofran for any nausea he may have at home.  Patient was given return precautions. Patient stable for discharge at this time.  Patient verbalized understanding of plan.         Final Clinical Impression(s) / ED Diagnoses Final diagnoses:  Nausea and vomiting, unspecified vomiting type    Rx / DC Orders ED Discharge Orders          Ordered    ondansetron (ZOFRAN) 4 MG tablet  Every 6 hours        03/24/23 1927              Remi Deter 03/24/23 1929    Pricilla Loveless, MD 03/24/23 2351

## 2023-03-24 NOTE — ED Triage Notes (Signed)
C/o nausea and RLQ discomfort today, possibly threw up some mucous

## 2023-03-24 NOTE — Discharge Instructions (Signed)
Please follow-up with your primary care provider regarding recent symptoms and ER visit.  Today your labs and imaging were all reassuring and you most likely have a viral illness.  Please remain hydrated and take Tylenol every 6 hours needed for pain.  I have also prescribed for you Zofran to take for nausea please take as prescribed.  If symptoms change or worsen please return to the ER.

## 2023-03-24 NOTE — Telephone Encounter (Signed)
Sad. Thank you.  Lets do all we can to help them.  Thanks.

## 2023-03-26 ENCOUNTER — Ambulatory Visit (INDEPENDENT_AMBULATORY_CARE_PROVIDER_SITE_OTHER): Payer: Medicare HMO | Admitting: Emergency Medicine

## 2023-03-26 ENCOUNTER — Encounter: Payer: Self-pay | Admitting: Emergency Medicine

## 2023-03-26 VITALS — BP 136/80 | HR 75 | Temp 97.8°F | Ht 71.0 in | Wt 138.1 lb

## 2023-03-26 DIAGNOSIS — E785 Hyperlipidemia, unspecified: Secondary | ICD-10-CM | POA: Diagnosis not present

## 2023-03-26 DIAGNOSIS — I10 Essential (primary) hypertension: Secondary | ICD-10-CM | POA: Diagnosis not present

## 2023-03-26 DIAGNOSIS — F01B18 Vascular dementia, moderate, with other behavioral disturbance: Secondary | ICD-10-CM | POA: Diagnosis not present

## 2023-03-26 MED ORDER — QUETIAPINE FUMARATE 50 MG PO TABS
50.0000 mg | ORAL_TABLET | Freq: Every day | ORAL | 3 refills | Status: DC
Start: 2023-03-26 — End: 2023-07-14

## 2023-03-26 NOTE — Progress Notes (Signed)
Javier [name] 82 y.o.   Chief Complaint  Patient presents with   Medical Management of Chronic Issues    Patient states he has bad anxiety, patient still wandering, no appetite off and on .     HISTORY OF PRESENT ILLNESS: This is a 82 y.o. male here for follow-up of chronic medical conditions.  Accompanied by wife and also CNA helping at home. Patient with history of moderate vascular dementia with behavioral disturbances.  Has been off the Seroquel for couple days Appetite on and off No other complaints or medical concerns today.  HPI   Prior to Admission medications   Medication Sig Start Date End Date Taking? Authorizing Provider  aspirin EC 81 MG tablet Take 81 mg by mouth daily.   Yes [provider]  busPIRone (BUSPAR) 5 MG tablet Take 1 tablet  3 x /day  for Nervousness & Chronic Anxiety 02/17/23  Yes Cassia Fein, Eilleen Kempf, MD  Cholecalciferol (VITAMIN D3) 5000 units TABS Take 5,000 Units by mouth daily.   Yes [provider]  hydrocortisone (ANUSOL-HC) 2.5 % rectal cream Place 1 Application rectally 2 (two) times daily. 04/30/22  Yes Long, Arlyss Repress, MD  lipase/protease/amylase (CREON) 12000-38000 units CPEP capsule Take 12,000 Units by mouth 3 (three) times daily with meals.   Yes [provider]  losartan (COZAAR) 100 MG tablet Take 1 tablet (100 mg total) by mouth daily. 09/23/22  Yes Cranford, Archie Patten, NP  ondansetron (ZOFRAN) 4 MG tablet Take 1 tablet (4 mg total) by mouth every 6 (six) hours. 03/24/23  Yes Schuman, Beverly Gust, PA-C  QUEtiapine (SEROQUEL) 25 MG tablet Take 1 tablet (25 mg total) by mouth at bedtime. 10/08/22  Yes Damyiah Moxley, Eilleen Kempf, MD  rosuvastatin (CRESTOR) 20 MG tablet TAKE 1 TABLET EVERY DAY FOR CHOLESTEROL 07/14/22  Yes Cranford, Tonya, NP  senna-docusate (SENOKOT-S) 8.6-50 MG tablet Take 1 tablet by mouth at bedtime as needed for mild constipation. 04/30/22  Yes Long, Arlyss Repress, MD  sertraline (ZOLOFT) 50 MG tablet Take  1 tablet   Daily  for Mood  / Chronic Anxiety 10/23/22  Yes Menucha Dicesare, Eilleen Kempf, MD  tamsulosin (FLOMAX) 0.4 MG CAPS capsule Take 1 capsule (0.4 mg total) by mouth daily after supper. For prostate. 01/09/21  Yes Judd Gaudier, NP  fluticasone (FLONASE) 50 MCG/ACT nasal spray Place 2 sprays into both nostrils daily. 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/2023   Pulmonary emphysema, unspecified emphysema type (HCC) 02/17/2023   Memory loss 10/29/2021   Pancreatic insufficiency 08/20/2021   Left-sided weakness 08/10/2021   Generalized weakness 08/09/2021   Personal history of CLL (chronic lymphocytic leukemia) 08/09/2021   Pancytopenia (HCC) 08/09/2021   At high risk for falls 04/29/2021   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) 03/28/2021   Sleeping difficulty 03/28/2021   History of lacunar cerebrovascular accident 01/09/2021   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   BPH with obstruction/lower urinary tract symptoms 01/09/2021   First degree AV block 01/09/2021   Gait abnormality 12/20/2020   BMI 26.0-26.9,adult 03/06/2020   Gastric AVM 03/06/2020   History of  colon polyps 03/06/2020   Aortic atherosclerosis (HCC) - per CT 04/2016 08/23/2019   Memory changes 05/25/2019   Anxiety 02/08/2018   CLL (chronic lymphocytic leukemia) (HCC) 11/01/2016   GERD  02/09/2015   Hyperlipidemia    Hypertension    Vitamin D deficiency    Diverticulosis     Past Medical History:  Diagnosis Date   Diverticulosis    Hyperlipidemia    Hypertension    Memory loss    Prediabetes    Vitamin D  deficiency     Past Surgical History:  Procedure Laterality Date   NO PAST SURGERIES      Social History   Socioeconomic History   Marital status: Married    Spouse name: Not on file   Number of children: 5   Years of education: 9th grade   Highest education level: Not on file  Occupational History   Occupation: retired  Tobacco Use   Smoking status: Former    Types: Cigarettes    Quit date: 01/17/2003    Years since quitting: 20.2   Smokeless tobacco: Never  Vaping Use   Vaping Use: Never used  Substance and Sexual Activity   Alcohol use: Not Currently    Comment: very rare   Drug use: No   Sexual activity: Not Currently  Other Topics Concern   Not on file  Social History Narrative   Lives at home with his wife.   Right-handed.   At most, one cup caffeine per day.    Social Determinants of Health   Financial Resource Strain: Not on file  Food Insecurity: No Food Insecurity (07/23/2022)   Hunger Vital Sign    Worried About Running Out of Food in the Last Year: Never true    Ran Out of Food in the Last Year: Never true  Transportation Needs: No Transportation Needs (07/23/2022)   PRAPARE - Administrator, Civil Service (Medical): No    Lack of Transportation (Non-Medical): No  Physical Activity: Not on file  Stress: Not on file  Social Connections: Not on file  Intimate Partner Violence: Not on file    Family History  Problem Relation Age of Onset   Stroke Mother    Early death Father        Farm/tractor accident   Diabetes Brother    Cirrhosis Brother    Colon cancer Paternal Grandfather      Review of Systems  Constitutional: Negative.  Negative for chills and fever.  HENT:  Negative for congestion and sore throat.   Respiratory: Negative.  Negative for cough and shortness of breath.   Cardiovascular: Negative.  Negative for chest pain.  Gastrointestinal:  Negative for abdominal pain, nausea and vomiting.  Skin: Negative.  Negative  for rash.  Neurological:  Negative for dizziness and headaches.  All other systems reviewed and are negative.   Vitals:   03/26/23 1358  BP: 136/80  Pulse: 75  Temp: 97.8 F (36.6 C)  SpO2: 98%    Physical Exam Vitals reviewed.  Constitutional:      Appearance: Normal appearance.  HENT:     Head: Normocephalic.  Eyes:     Extraocular Movements: Extraocular movements intact.     Pupils: Pupils are equal, round, and reactive to light.  Cardiovascular:     Rate and Rhythm: Normal rate and regular rhythm.     Pulses: Normal pulses.     Heart sounds: Normal heart sounds.  Pulmonary:     Effort:  Pulmonary effort is normal.     Breath sounds: Normal breath sounds.  Abdominal:     Palpations: Abdomen is soft.     Tenderness: There is no abdominal tenderness.  Musculoskeletal:     Cervical back: No tenderness.  Lymphadenopathy:     Cervical: No cervical adenopathy.  Skin:    General: Skin is warm and dry.     Capillary Refill: Capillary refill takes less than 2 seconds.  Neurological:     General: No focal deficit present.     Mental Status: He is alert.  Psychiatric:        Behavior: Behavior normal.      ASSESSMENT & PLAN: A total of 43 minutes was spent with the patient and counseling/coordination of care regarding preparing for this visit, review of most recent office visit notes, review of most recent emergency department visit notes, review of most recent blood work results, review of multiple chronic medical conditions under management, review of all medications, management of anxiety and behavioral disturbances, prognosis, documentation, and need for follow-up.  Problem List Items Addressed This Visit       Cardiovascular and Mediastinum   Hypertension    BP Readings from Last 3 Encounters:  03/26/23 136/80  03/24/23 (!) 182/87  02/17/23 130/80  Well-controlled hypertension Continue losartan 100 mg daily         Nervous and Auditory   Moderate  vascular dementia with other behavioral disturbance (HCC) - Primary    Still active and affecting quality of life of both the patient and caregiver Recommend to increase dose of Seroquel to 50 mg at bedtime      Relevant Medications   QUEtiapine (SEROQUEL) 50 MG tablet     Other   Dyslipidemia    Chronic stable condition Continue rosuvastatin 20 mg daily      Patient Instructions  Dementia Caregiver Guide Dementia is a term used to describe a number of symptoms that affect memory and thinking. The most common symptoms include: Memory loss. Trouble with language and communication. Trouble concentrating. Poor judgment and problems with reasoning. Wandering from home or public places. Extreme anxiety or depression. Being suspicious or having angry outbursts and accusations. Child-like behavior and language. Dementia can be frightening and confusing. And taking care of someone with dementia can be challenging. This guide provides tips to help you when providing care for a person with dementia. How to help manage lifestyle changes Dementia usually gets worse slowly over time. In the early stages, people with dementia can stay independent and safe with some help. In later stages, they need help with daily tasks such as dressing, grooming, and using the bathroom. There are actions you can take to help a person manage his or her life while living with this condition. Communicating When the person is talking or seems frustrated, make eye contact and hold the person's hand. Ask specific questions that need yes or no answers. Use simple words, short sentences, and a calm voice. Only give one direction at a time. When offering choices, limit the person to just one or two. Avoid correcting the person in a negative way. If the person is struggling to find the right words, gently try to help him or her. Preventing injury  Keep floors clear of clutter. Remove rugs, magazine racks, and floor  lamps. Keep hallways well lit, especially at night. Put a handrail and nonslip mat in the bathtub or shower. Put childproof locks on cabinets that contain dangerous items, such as  medicines, alcohol, guns, toxic cleaning items, sharp tools or utensils, matches, and lighters. For doors to the outside of the house, put the locks in places where the person cannot see or reach them easily. This will help ensure that the person does not wander out of the house and get lost. Be prepared for emergencies. Keep a list of emergency phone numbers and addresses in a convenient area. Remove car keys and lock garage doors so that the person does not try to get in the car and drive. Have the person wear a bracelet that tracks locations and identifies the person as having memory problems. This should be worn at all times for safety. Helping with daily life  Keep the person on track with his or her routine. Try to identify areas where the person may need help. Be supportive, patient, calm, and encouraging. Gently remind the person that adjusting to changes takes time. Help with the tasks that the person has asked for help with. Keep the person involved in daily tasks and decisions as much as possible. Encourage conversation, but try not to get frustrated if the person struggles to find words or does not seem to appreciate your help. How to recognize stress Look for signs of stress in yourself and in the person you are caring for. If you notice signs of stress, take steps to manage it. Symptoms of stress include: Feeling anxious, irritable, frustrated, or angry. Denying that the person has dementia or that his or her symptoms will not improve. Feeling depressed, hopeless, or unappreciated. Difficulty sleeping. Difficulty concentrating. Developing stress-related health problems. Feeling like you have too little time for your own life. Follow these instructions at home: Take care of your health Make sure  that you and the person you are caring for: Get regular sleep. Exercise regularly. Eat regular, nutritious meals. Take over-the-counter and prescription medicines only as told by your health care providers. Drink enough fluid to keep your urine pale yellow. Attend all scheduled health care appointments.  General instructions Join a support group with others who are caregivers. Ask about respite care resources. Respite care can provide short-term care for the person so that you can have a regular break from the stress of caregiving. Consider any safety risks and take steps to avoid them. Organize medicines in a pill box for each day of the week. Create a plan to handle any legal or financial matters. Get legal or financial advice if needed. Keep a calendar in a central location to remind the person of appointments or other activities. Where to find support: Many individuals and organizations offer support. These include: Support groups for people with dementia. Support groups for caregivers. Counselors or therapists. Home health care services. Adult day care centers. Where to find more information Centers for Disease Control and Prevention: FootballExhibition.com.br Alzheimer's Association: LimitLaws.hu Family Caregiver Alliance: www.caregiver.org Alzheimer's Foundation of [place]: www.alzfdn.org Contact a health care provider if: The person's health is rapidly getting worse. You are no longer able to care for the person. Caring for the person is affecting your physical and emotional health. You are feeling depressed or anxious about caring for the person. Get help right away if: The person threatens himself or herself, you, or anyone else. You feel depressed or sad, or feel that you want to harm yourself. If you ever feel like your loved one may hurt himself or herself or others, or if he or she shares thoughts about taking his or her own life, get help right away.  You can go to your nearest  emergency department or: Call your local emergency services (911 in the [place] Call a suicide crisis helpline, such as the National Suicide Prevention Lifeline at 505-259-0898 or 988 in the [place] This is open 24 hours a day in the [place] Text the Crisis Text Line at (704)488-2761 (in the [place] Summary Dementia is a term used to describe a number of symptoms that affect memory and thinking. Dementia usually gets worse slowly over time. Take steps to reduce the person's risk of injury and to plan for future care. Caregivers need support, relief from caregiving, and time for their own lives. This information is not intended to replace advice given to you by your health care provider. Make sure you discuss any questions you have with your health care provider. Document Revised: 04/10/2021 Document Reviewed: 01/30/2020 Elsevier Patient Education  2024 Elsevier Inc.      Javier Barth, MD [facility]

## 2023-03-26 NOTE — Assessment & Plan Note (Signed)
Chronic stable condition. Continue rosuvastatin 20 mg daily.  

## 2023-03-26 NOTE — Assessment & Plan Note (Signed)
BP Readings from Last 3 Encounters:  03/26/23 136/80  03/24/23 (!) 182/87  02/17/23 130/80  Well-controlled hypertension Continue losartan 100 mg daily

## 2023-03-26 NOTE — Patient Instructions (Signed)
Dementia Caregiver Guide Dementia is a term used to describe a number of symptoms that affect memory and thinking. The most common symptoms include: Memory loss. Trouble with language and communication. Trouble concentrating. Poor judgment and problems with reasoning. Wandering from home or public places. Extreme anxiety or depression. Being suspicious or having angry outbursts and accusations. Child-like behavior and language. Dementia can be frightening and confusing. And taking care of someone with dementia can be challenging. This guide provides tips to help you when providing care for a person with dementia. How to help manage lifestyle changes Dementia usually gets worse slowly over time. In the early stages, people with dementia can stay independent and safe with some help. In later stages, they need help with daily tasks such as dressing, grooming, and using the bathroom. There are actions you can take to help a person manage his or her life while living with this condition. Communicating When the person is talking or seems frustrated, make eye contact and hold the person's hand. Ask specific questions that need yes or no answers. Use simple words, short sentences, and a calm voice. Only give one direction at a time. When offering choices, limit the person to just one or two. Avoid correcting the person in a negative way. If the person is struggling to find the right words, gently try to help him or her. Preventing injury  Keep floors clear of clutter. Remove rugs, magazine racks, and floor lamps. Keep hallways well lit, especially at night. Put a handrail and nonslip mat in the bathtub or shower. Put childproof locks on cabinets that contain dangerous items, such as medicines, alcohol, guns, toxic cleaning items, sharp tools or utensils, matches, and lighters. For doors to the outside of the house, put the locks in places where the person cannot see or reach them easily. This will  help ensure that the person does not wander out of the house and get lost. Be prepared for emergencies. Keep a list of emergency phone numbers and addresses in a convenient area. Remove car keys and lock garage doors so that the person does not try to get in the car and drive. Have the person wear a bracelet that tracks locations and identifies the person as having memory problems. This should be worn at all times for safety. Helping with daily life  Keep the person on track with his or her routine. Try to identify areas where the person may need help. Be supportive, patient, calm, and encouraging. Gently remind the person that adjusting to changes takes time. Help with the tasks that the person has asked for help with. Keep the person involved in daily tasks and decisions as much as possible. Encourage conversation, but try not to get frustrated if the person struggles to find words or does not seem to appreciate your help. How to recognize stress Look for signs of stress in yourself and in the person you are caring for. If you notice signs of stress, take steps to manage it. Symptoms of stress include: Feeling anxious, irritable, frustrated, or angry. Denying that the person has dementia or that his or her symptoms will not improve. Feeling depressed, hopeless, or unappreciated. Difficulty sleeping. Difficulty concentrating. Developing stress-related health problems. Feeling like you have too little time for your own life. Follow these instructions at home: Take care of your health Make sure that you and the person you are caring for: Get regular sleep. Exercise regularly. Eat regular, nutritious meals. Take over-the-counter and prescription medicines only   as told by your health care providers. Drink enough fluid to keep your urine pale yellow. Attend all scheduled health care appointments.  General instructions Join a support group with others who are caregivers. Ask about  respite care resources. Respite care can provide short-term care for the person so that you can have a regular break from the stress of caregiving. Consider any safety risks and take steps to avoid them. Organize medicines in a pill box for each day of the week. Create a plan to handle any legal or financial matters. Get legal or financial advice if needed. Keep a calendar in a central location to remind the person of appointments or other activities. Where to find support: Many individuals and organizations offer support. These include: Support groups for people with dementia. Support groups for caregivers. Counselors or therapists. Home health care services. Adult day care centers. Where to find more information Centers for Disease Control and Prevention: www.cdc.gov Alzheimer's Association: www.alz.org Family Caregiver Alliance: www.caregiver.org Alzheimer's Foundation of [place]: www.alzfdn.org Contact a health care provider if: The person's health is rapidly getting worse. You are no longer able to care for the person. Caring for the person is affecting your physical and emotional health. You are feeling depressed or anxious about caring for the person. Get help right away if: The person threatens himself or herself, you, or anyone else. You feel depressed or sad, or feel that you want to harm yourself. If you ever feel like your loved one may hurt himself or herself or others, or if he or she shares thoughts about taking his or her own life, get help right away. You can go to your nearest emergency department or: Call your local emergency services (911 in the [place] Call a suicide crisis helpline, such as the National Suicide Prevention Lifeline at 1-800-273-8255 or 988 in the [place] This is open 24 hours a day in the [place] Text the Crisis Text Line at 741741 (in the [place] Summary Dementia is a term used to describe a number of symptoms that affect memory and thinking. Dementia  usually gets worse slowly over time. Take steps to reduce the person's risk of injury and to plan for future care. Caregivers need support, relief from caregiving, and time for their own lives. This information is not intended to replace advice given to you by your health care provider. Make sure you discuss any questions you have with your health care provider. Document Revised: 04/10/2021 Document Reviewed: 01/30/2020 Elsevier Patient Education  2024 Elsevier Inc.  

## 2023-03-26 NOTE — Assessment & Plan Note (Signed)
Still active and affecting quality of life of both the patient and caregiver Recommend to increase dose of Seroquel to 50 mg at bedtime

## 2023-04-24 ENCOUNTER — Other Ambulatory Visit: Payer: Self-pay

## 2023-05-05 ENCOUNTER — Ambulatory Visit (INDEPENDENT_AMBULATORY_CARE_PROVIDER_SITE_OTHER): Payer: Medicare HMO

## 2023-05-05 VITALS — Ht 71.0 in | Wt 145.0 lb

## 2023-05-05 DIAGNOSIS — Z0000 Encounter for general adult medical examination without abnormal findings: Secondary | ICD-10-CM

## 2023-05-05 NOTE — Patient Instructions (Addendum)
Javier [name] , Thank you for taking time to come for your Medicare Wellness Visit. I appreciate your ongoing commitment to your health goals. Please review the following plan we discussed and let me know if I can assist you in the future.   Referrals/Orders/Follow-Ups/Clinician Recommendations: No  This is a list of the screening recommended for you and due dates:  Health Maintenance  Topic Date Due   Zoster (Shingles) Vaccine (2 of 2) 03/26/2023   Flu Shot  04/30/2023   COVID-19 Vaccine (5 - 2023-24 season) 06/01/2023*   Pneumonia Vaccine (2 of 2 - PPSV23 or PCV20) 06/10/2023*   DTaP/Tdap/Td vaccine (3 - Td or Tdap) 09/30/2023   Colon Cancer Screening  02/06/2024   Medicare Annual Wellness Visit  05/04/2024   HPV Vaccine  Aged Out   Hepatitis C Screening  Discontinued  *Topic was postponed. The date shown is not the original due date.    Advanced directives: (Copy Requested) Please bring a copy of your health care power of attorney and living will to the office to be added to your chart at your convenience.  Next Medicare Annual Wellness Visit scheduled for next year: Yes  Preventive Care 19 Years and Older, Male  Preventive care refers to lifestyle choices and visits with your health care provider that can promote health and wellness. What does preventive care include? A yearly physical exam. This is also called an annual well check. Dental exams once or twice a year. Routine eye exams. Ask your health care provider how often you should have your eyes checked. Personal lifestyle choices, including: Daily care of your teeth and gums. Regular physical activity. Eating a healthy diet. Avoiding tobacco and drug use. Limiting alcohol use. Practicing safe sex. Taking low doses of aspirin every day. Taking vitamin and mineral supplements as recommended by your health care provider. What happens during an annual well check? The services and screenings done by your health care  provider during your annual well check will depend on your age, overall health, lifestyle risk factors, and family history of disease. Counseling  Your health care provider may ask you questions about your: Alcohol use. Tobacco use. Drug use. Emotional well-being. Home and relationship well-being. Sexual activity. Eating habits. History of falls. Memory and ability to understand (cognition). Work and work Astronomer. Screening  You may have the following tests or measurements: Height, weight, and BMI. Blood pressure. Lipid and cholesterol levels. These may be checked every 5 years, or more frequently if you are over 28 years old. Skin check. Lung cancer screening. You may have this screening every year starting at age 39 if you have a 30-pack-year history of smoking and currently smoke or have quit within the past 15 years. Fecal occult blood test (FOBT) of the stool. You may have this test every year starting at age 46. Flexible sigmoidoscopy or colonoscopy. You may have a sigmoidoscopy every 5 years or a colonoscopy every 10 years starting at age 31. Prostate cancer screening. Recommendations will vary depending on your family history and other risks. Hepatitis C blood test. Hepatitis B blood test. Sexually transmitted disease (STD) testing. Diabetes screening. This is done by checking your blood sugar (glucose) after you have not eaten for a while (fasting). You may have this done every 1-3 years. Abdominal aortic aneurysm (AAA) screening. You may need this if you are a current or former smoker. Osteoporosis. You may be screened starting at age 68 if you are at high risk. Talk with your health  care provider about your test results, treatment options, and if necessary, the need for more tests. Vaccines  Your health care provider may recommend certain vaccines, such as: Influenza vaccine. This is recommended every year. Tetanus, diphtheria, and acellular pertussis (Tdap, Td)  vaccine. You may need a Td booster every 10 years. Zoster vaccine. You may need this after age 71. Pneumococcal 13-valent conjugate (PCV13) vaccine. One dose is recommended after age 69. Pneumococcal polysaccharide (PPSV23) vaccine. One dose is recommended after age 57. Talk to your health care provider about which screenings and vaccines you need and how often you need them. This information is not intended to replace advice given to you by your health care provider. Make sure you discuss any questions you have with your health care provider. Document Released: 10/12/2015 Document Revised: 06/04/2016 Document Reviewed: 07/17/2015 Elsevier Interactive Patient Education  2017 [employer].  Fall Prevention in the Home Falls can cause injuries. They can happen to people of all ages. There are many things you can do to make your home safe and to help prevent falls. What can I do on the outside of my home? Regularly fix the edges of walkways and driveways and fix any cracks. Remove anything that might make you trip as you walk through a door, such as a raised step or threshold. Trim any bushes or trees on the path to your home. Use bright outdoor lighting. Clear any walking paths of anything that might make someone trip, such as rocks or tools. Regularly check to see if handrails are loose or broken. Make sure that both sides of any steps have handrails. Any raised decks and porches should have guardrails on the edges. Have any leaves, snow, or ice cleared regularly. Use sand or salt on walking paths during winter. Clean up any spills in your garage right away. This includes oil or grease spills. What can I do in the bathroom? Use night lights. Install grab bars by the toilet and in the tub and shower. Do not use towel bars as grab bars. Use non-skid mats or decals in the tub or shower. If you need to sit down in the shower, use a plastic, non-slip stool. Keep the floor dry. Clean up any  water that spills on the floor as soon as it happens. Remove soap buildup in the tub or shower regularly. Attach bath mats securely with double-sided non-slip rug tape. Do not have throw rugs and other things on the floor that can make you trip. What can I do in the bedroom? Use night lights. Make sure that you have a light by your bed that is easy to reach. Do not use any sheets or blankets that are too big for your bed. They should not hang down onto the floor. Have a firm chair that has side arms. You can use this for support while you get dressed. Do not have throw rugs and other things on the floor that can make you trip. What can I do in the kitchen? Clean up any spills right away. Avoid walking on wet floors. Keep items that you use a lot in easy-to-reach places. If you need to reach something above you, use a strong step stool that has a grab bar. Keep electrical cords out of the way. Do not use floor polish or wax that makes floors slippery. If you must use wax, use non-skid floor wax. Do not have throw rugs and other things on the floor that can make you trip. What can  I do with my stairs? Do not leave any items on the stairs. Make sure that there are handrails on both sides of the stairs and use them. Fix handrails that are broken or loose. Make sure that handrails are as long as the stairways. Check any carpeting to make sure that it is firmly attached to the stairs. Fix any carpet that is loose or worn. Avoid having throw rugs at the top or bottom of the stairs. If you do have throw rugs, attach them to the floor with carpet tape. Make sure that you have a light switch at the top of the stairs and the bottom of the stairs. If you do not have them, ask someone to add them for you. What else can I do to help prevent falls? Wear shoes that: Do not have high heels. Have rubber bottoms. Are comfortable and fit you well. Are closed at the toe. Do not wear sandals. If you use a  stepladder: Make sure that it is fully opened. Do not climb a closed stepladder. Make sure that both sides of the stepladder are locked into place. Ask someone to hold it for you, if possible. Clearly mark and make sure that you can see: Any grab bars or handrails. First and last steps. Where the edge of each step is. Use tools that help you move around (mobility aids) if they are needed. These include: Canes. Walkers. Scooters. Crutches. Turn on the lights when you go into a dark area. Replace any light bulbs as soon as they burn out. Set up your furniture so you have a clear path. Avoid moving your furniture around. If any of your floors are uneven, fix them. If there are any pets around you, be aware of where they are. Review your medicines with your doctor. Some medicines can make you feel dizzy. This can increase your chance of falling. Ask your doctor what other things that you can do to help prevent falls. This information is not intended to replace advice given to you by your health care provider. Make sure you discuss any questions you have with your health care provider. Document Released: 07/12/2009 Document Revised: 02/21/2016 Document Reviewed: 10/20/2014 Elsevier Interactive Patient Education  2017 [employer].

## 2023-05-05 NOTE — Progress Notes (Addendum)
Subjective:   Javier [name] is a 82 y.o. male who presents for Medicare Annual/Subsequent preventive examination.  Visit Complete: Virtual  I connected with  Javier [name] on 05/05/23 by a audio enabled telemedicine application and verified that I am speaking with the correct person using two identifiers.  Patient Location: Home  Provider Location: Office/Clinic  I discussed the limitations of evaluation and management by telemedicine. The patient expressed understanding and agreed to proceed.  Vital Signs: Patient was unable to self-report vital signs via telehealth due to a lack of equipment at home.  Patient has current diagnosis of cognitive impairment. Patient is followed by primary care for ongoing assessment. Patient is unable to complete screening 6CIT or MMSE.   Review of Systems     Cardiac Risk Factors include: advanced age (>33men, >55 women);dyslipidemia;family history of premature cardiovascular disease;hypertension;male gender;sedentary lifestyle     Objective:    Today's Vitals   05/05/23 1002  Weight: 145 lb (65.8 kg)  Height: 5\' 11"  (1.803 m)  PainSc: 0-No pain   Body mass index is 20.22 kg/m.     05/05/2023   10:04 AM 03/24/2023    4:37 PM 04/30/2022   11:03 AM 04/29/2022    1:37 PM 04/20/2021    2:23 PM 02/22/2021    7:50 AM 12/26/2020   12:38 PM  Advanced Directives  Does Patient Have a Medical Advance Directive? Yes No Yes No No No Yes  Type of Estate agent of [place];Living will      Healthcare Power of [place];Living will  Does patient want to make changes to medical advance directive?       No - Patient declined  Copy of Healthcare Power of Attorney in Chart? No - copy requested        Would patient like information on creating a medical advance directive?    No - Patient declined  No - Patient declined     Current Medications (verified) Outpatient Encounter Medications as of 05/05/2023  Medication Sig   aspirin EC 81  MG tablet Take 81 mg by mouth daily.   busPIRone (BUSPAR) 5 MG tablet Take 1 tablet  3 x /day  for Nervousness & Chronic Anxiety   Cholecalciferol (VITAMIN D3) 5000 units TABS Take 5,000 Units by mouth daily.   fluticasone (FLONASE) 50 MCG/ACT nasal spray Place 2 sprays into both nostrils daily.   hydrocortisone (ANUSOL-HC) 2.5 % rectal cream Place 1 Application rectally 2 (two) times daily.   lipase/protease/amylase (CREON) 12000-38000 units CPEP capsule Take 12,000 Units by mouth 3 (three) times daily with meals.   losartan (COZAAR) 100 MG tablet Take 1 tablet (100 mg total) by mouth daily.   ondansetron (ZOFRAN) 4 MG tablet Take 1 tablet (4 mg total) by mouth every 6 (six) hours.   QUEtiapine (SEROQUEL) 50 MG tablet Take 1 tablet (50 mg total) by mouth at bedtime.   rosuvastatin (CRESTOR) 20 MG tablet TAKE 1 TABLET EVERY DAY FOR CHOLESTEROL   senna-docusate (SENOKOT-S) 8.6-50 MG tablet Take 1 tablet by mouth at bedtime as needed for mild constipation.   sertraline (ZOLOFT) 50 MG tablet Take  1 tablet  Daily  for Mood  / Chronic Anxiety   tamsulosin (FLOMAX) 0.4 MG CAPS capsule Take 1 capsule (0.4 mg total) by mouth daily after supper. For prostate.   No facility-administered encounter medications on file as of 05/05/2023.    Allergies (verified) Penicillins and Ppd [tuberculin purified protein derivative]   History: Past Medical  History:  Diagnosis Date   Diverticulosis    Hyperlipidemia    Hypertension    Memory loss    Prediabetes    Vitamin D deficiency    Past Surgical History:  Procedure Laterality Date   NO PAST SURGERIES     Family History  Problem Relation Age of Onset   Stroke Mother    Early death Father        Farm/tractor accident   Diabetes Brother    Cirrhosis Brother    Colon cancer Paternal Grandfather    Social History   Socioeconomic History   Marital status: Married    Spouse name: Not on file   Number of children: 5   Years of education: 9th grade    Highest education level: Not on file  Occupational History   Occupation: retired  Tobacco Use   Smoking status: Former    Current packs/day: 0.00    Types: Cigarettes    Quit date: 01/17/2003    Years since quitting: 20.3   Smokeless tobacco: Never  Vaping Use   Vaping status: Never Used  Substance and Sexual Activity   Alcohol use: Not Currently    Comment: very rare   Drug use: No   Sexual activity: Not Currently  Other Topics Concern   Not on file  Social History Narrative   Lives at home with his wife.   Right-handed.   At most, one cup caffeine per day.    Social Determinants of Health   Financial Resource Strain: Low Risk  (05/05/2023)   Overall Financial Resource Strain (CARDIA)    Difficulty of Paying Living Expenses: Not hard at all  Food Insecurity: No Food Insecurity (05/05/2023)   Hunger Vital Sign    Worried About Running Out of Food in the Last Year: Never true    Ran Out of Food in the Last Year: Never true  Transportation Needs: No Transportation Needs (05/05/2023)   PRAPARE - Administrator, Civil Service (Medical): No    Lack of Transportation (Non-Medical): No  Physical Activity: Insufficiently Active (05/05/2023)   Exercise Vital Sign    Days of Exercise per Week: 1 day    Minutes of Exercise per Session: 40 min  Stress: No Stress Concern Present (05/05/2023)   [employer] of Occupational Health - Occupational Stress Questionnaire    Feeling of Stress : Not at all  Social Connections: Socially Integrated (05/05/2023)   Social Connection and Isolation Panel [NHANES]    Frequency of Communication with Friends and Family: More than three times a week    Frequency of Social Gatherings with Friends and Family: More than three times a week    Attends Religious Services: More than 4 times per year    Active Member of [employer] or Organizations: Yes    Attends Engineer, structural: More than 4 times per year    Marital Status: Married     Tobacco Counseling Counseling given: Not Answered   Clinical Intake:  Pre-visit preparation completed: Yes  Pain : No/denies pain Pain Score: 0-No pain     BMI - recorded: 20.22 Nutritional Status: BMI of 19-24  Normal Nutritional Risks: None Diabetes: No  How often do you need to have someone help you when you read instructions, pamphlets, or other written materials from your doctor or pharmacy?: 1 - Never What is the last grade level you completed in school?: HSG  Interpreter Needed?: No  Information entered by ::  N. [place],  LPN.   Activities of Daily Living    05/05/2023   10:12 AM  In your present state of health, do you have any difficulty performing the following activities:  Hearing? 1  Comment in the process of g etting new hearing aids from the [place]  Vision? 0  Difficulty concentrating or making decisions? 1  Walking or climbing stairs? 1  Dressing or bathing? 1  Comment yes, has a caregiver for 4 hours everyday  Doing errands, shopping? 1  Preparing Food and eating ? Y  Comment does not cook  Using the Toilet? N  In the past six months, have you accidently leaked urine? N  Do you have problems with loss of bowel control? N  Managing your Medications? Y  Managing your Finances? Y  Housekeeping or managing your Housekeeping? Y    Patient Care Team: Georgina Quint, MD as PCP - General (Internal Medicine) Manning Charity, OD as Referring Physician (Optometry) Danis, Andreas Blower, MD as Consulting Physician (Gastroenterology) Johney Maine, MD as Consulting Physician (Hematology) Colletta [place], RN as [facility] Care Management  Indicate any recent Medical Services you may have received from other than [facility] providers in the past year (date may be approximate).     Assessment:   This is a routine wellness examination for Javier [name].  Hearing/Vision screen Hearing Screening - Comments:: Patient has hearing difficulty;  [facility] is getting hearing aids ready for pick up. Vision Screening - Comments:: Patient does wear corrective lenses/contacts.  Annual eye exam done by: [facility]   Dietary issues and exercise activities discussed:     Goals Addressed   None   Depression Screen    05/05/2023   10:06 AM 02/17/2023    2:11 PM 10/21/2022    9:07 AM 09/19/2022   10:33 AM 08/12/2022    3:00 PM 04/29/2022    1:36 PM 03/05/2022   10:47 AM  PHQ 2/9 Scores  PHQ - 2 Score 0 0 0 0 0  5  PHQ- 9 Score 9   0   16  Exception Documentation      Other- indicate reason in comment box   Not completed      wife completes assessment     Fall Risk    05/05/2023   10:06 AM 03/26/2023    2:04 PM 02/17/2023    2:10 PM 10/21/2022    9:07 AM 09/19/2022   10:33 AM  Fall Risk   Falls in the past year? 1 1 1 1  0  Number falls in past yr: 1 0 1 0 0  Injury with Fall? 1 1 0 0 0  Risk for fall due to : History of fall(s);Impaired balance/gait;Orthopedic patient No Fall Risks Impaired balance/gait;Impaired mobility Impaired balance/gait;Impaired mobility   Follow up Education provided;Falls prevention discussed;Falls evaluation completed Falls evaluation completed Falls evaluation completed Falls evaluation completed Falls evaluation completed    MEDICARE RISK AT HOME:  Medicare Risk at Home - 05/05/23 1010     Any stairs in or around the home? No    If so, are there any without handrails? No    Home free of loose throw rugs in walkways, pet beds, electrical cords, etc? Yes    Adequate lighting in your home to reduce risk of falls? Yes    Life alert? Yes   video in the home   Use of a cane, walker or w/c? Yes    Grab bars in the bathroom? Yes  Shower chair or bench in shower? Yes    Elevated toilet seat or a handicapped toilet? Yes             TIMED UP AND GO:  Was the test performed?  No    Cognitive Function:    12/20/2020    2:07 PM 05/25/2019    6:12 PM  MMSE - Mini Mental State Exam  Orientation to time 5 5   Orientation to Place 5 5  Registration 2 3  Attention/ Calculation 5 5  Recall 3 2  Language- name 2 objects 2 2  Language- repeat 1 1  Language- follow 3 step command 3 3  Language- read & follow direction 1 1  Write a sentence 1 1  Copy design 0 1  Total score 28 29      10/29/2021    1:23 PM  Montreal Cognitive Assessment   Visuospatial/ Executive (0/5) 1  Naming (0/3) 3  Attention: Read list of digits (0/2) 2  Attention: Read list of letters (0/1) 1  Attention: Serial 7 subtraction starting at 100 (0/3) 3  Language: Repeat phrase (0/2) 1  Language : Fluency (0/1) 0  Abstraction (0/2) 1  Delayed Recall (0/5) 1  Orientation (0/6) 4  Total 17      Immunizations Immunization History  Administered Date(s) Administered   Fluad Quad(high Dose 65+) 06/09/2022   Influenza Split 07/28/2013   Influenza, High Dose Seasonal PF 09/11/2015, 06/09/2016, 09/15/2018   PFIZER Comirnaty(Gray Top)Covid-19 Tri-Sucrose Vaccine 11/05/2019, 11/26/2019, 08/18/2020, 04/19/2021   Pneumococcal Conjugate-13 11/05/2017   Pneumococcal-Unspecified 07/01/1999, 09/20/2009   RSV,unspecified 01/29/2023   Td 09/20/2009   Tdap 09/29/2013   Zoster Recombinant(Shingrix) 01/29/2023   Zoster, Live 11/08/2009    TDAP status: Up to date  Flu Vaccine status: Up to date  Pneumococcal vaccine status: Due, Education has been provided regarding the importance of this vaccine. Advised may receive this vaccine at local pharmacy or Health Dept. Aware to provide a copy of the vaccination record if obtained from local pharmacy or Health Dept. Verbalized acceptance and understanding.  Covid-19 vaccine status: Completed vaccines  Qualifies for Shingles Vaccine? Yes   Zostavax completed Yes   Shingrix Completed?: No.    Education has been provided regarding the importance of this vaccine. Patient has been advised to call insurance company to determine out of pocket expense if they have not yet received this  vaccine. Advised may also receive vaccine at local pharmacy or Health Dept. Verbalized acceptance and understanding.  Screening Tests Health Maintenance  Topic Date Due   Zoster Vaccines- Shingrix (2 of 2) 03/26/2023   INFLUENZA VACCINE  04/30/2023   COVID-19 Vaccine (5 - 2023-24 season) 06/01/2023 (Originally 05/30/2022)   Pneumonia Vaccine 15+ Years old (2 of 2 - PPSV23 or PCV20) 06/10/2023 (Originally 12/31/2017)   DTaP/Tdap/Td (3 - Td or Tdap) 09/30/2023   Colonoscopy  02/06/2024   Medicare Annual Wellness (AWV)  05/04/2024   HPV VACCINES  Aged Out   Hepatitis C Screening  Discontinued    Health Maintenance  Health Maintenance Due  Topic Date Due   Zoster Vaccines- Shingrix (2 of 2) 03/26/2023   INFLUENZA VACCINE  04/30/2023    Colorectal cancer screening: Type of screening: Colonoscopy. Completed 02/05/2021. Repeat every 3 years  Lung Cancer Screening: (Low Dose CT Chest recommended if Age 83-80 years, 20 pack-year currently smoking OR have quit w/in 15years.) does not qualify.   Lung Cancer Screening Referral: NO  Additional Screening:  Hepatitis C Screening:  does qualify; Completed 03/07/2020  Vision Screening: Recommended annual ophthalmology exams for early detection of glaucoma and other disorders of the eye. Is the patient up to date with their annual eye exam?  Yes  Who is the provider or what is the name of the office in which the patient attends annual eye exams? [facility] If pt is not established with a provider, would they like to be referred to a provider to establish care? No .   Dental Screening: Recommended annual dental exams for proper oral hygiene  Diabetic Foot Exam: Patient not diabetic.  Community Resource Referral / Chronic Care Management: CRR required this visit?  No   CCM required this visit?  No     Plan:     I have personally reviewed and noted the following in the patient's chart:   Medical and social history Use of  alcohol, tobacco or illicit drugs  Current medications and supplements including opioid prescriptions. Patient is not currently taking opioid prescriptions. Functional ability and status Nutritional status Physical activity Advanced directives List of other physicians Hospitalizations, surgeries, and ER visits in previous 12 months Vitals Screenings to include cognitive, depression, and falls Referrals and appointments  In addition, I have reviewed and discussed with patient certain preventive protocols, quality metrics, and best practice recommendations. A written personalized care plan for preventive services as well as general preventive health recommendations were provided to patient.     Mickeal Needy, LPN   4/0/9811   After Visit Summary: (Mail) Due to this being a telephonic visit, the after visit summary with patients personalized plan was offered to patient via mail   Nurse Notes: Visit was completed by wife due to patient having dementia.

## 2023-07-14 ENCOUNTER — Ambulatory Visit: Payer: Medicare HMO | Admitting: Emergency Medicine

## 2023-07-14 ENCOUNTER — Encounter: Payer: Self-pay | Admitting: Emergency Medicine

## 2023-07-14 VITALS — BP 128/88 | HR 58 | Temp 98.0°F | Ht 71.0 in | Wt 148.5 lb

## 2023-07-14 DIAGNOSIS — E785 Hyperlipidemia, unspecified: Secondary | ICD-10-CM

## 2023-07-14 DIAGNOSIS — Z23 Encounter for immunization: Secondary | ICD-10-CM | POA: Diagnosis not present

## 2023-07-14 DIAGNOSIS — I10 Essential (primary) hypertension: Secondary | ICD-10-CM | POA: Diagnosis not present

## 2023-07-14 DIAGNOSIS — F01B18 Vascular dementia, moderate, with other behavioral disturbance: Secondary | ICD-10-CM | POA: Diagnosis not present

## 2023-07-14 DIAGNOSIS — Z856 Personal history of leukemia: Secondary | ICD-10-CM | POA: Diagnosis not present

## 2023-07-14 MED ORDER — QUETIAPINE FUMARATE 50 MG PO TABS: 50.0000 mg | ORAL_TABLET | Freq: Two times a day (BID) | ORAL | 3 refills | Status: DC

## 2023-07-14 NOTE — Progress Notes (Signed)
Javier [name] 82 y.o.   Chief Complaint  Patient presents with   Dementia    PT constantly wondering in and out of home. PT fell while in the bathroom. Notes of pain on right side. PT using wall to assist walking    HISTORY OF PRESENT ILLNESS: This is a 82 y.o. male accompanied by wife here for 62-month follow-up of chronic medical conditions Has dementia with behavioral disturbance.  On Seroquel 50 mg bedtime as needed Recently fell off the commode and hurt right hip.  No fractures or significant injuries Getting help at home. No other complaints or medical concerns today.  HPI   Prior to Admission medications   Medication Sig Start Date End Date Taking? Authorizing Provider  aspirin EC 81 MG tablet Take 81 mg by mouth daily.   Yes [provider]  busPIRone (BUSPAR) 5 MG tablet Take 1 tablet  3 x /day  for Nervousness & Chronic Anxiety 02/17/23  Yes Aleyda Gindlesperger, Eilleen Kempf, MD  Cholecalciferol (VITAMIN D3) 5000 units TABS Take 5,000 Units by mouth daily.   Yes [provider]  losartan (COZAAR) 100 MG tablet Take 1 tablet (100 mg total) by mouth daily. 09/23/22  Yes Cranford, Archie Patten, NP  ondansetron (ZOFRAN) 4 MG tablet Take 1 tablet (4 mg total) by mouth every 6 (six) hours. 03/24/23  Yes Schuman, Fayrene Fearing T, PA-C  rosuvastatin (CRESTOR) 20 MG tablet TAKE 1 TABLET EVERY DAY FOR CHOLESTEROL 07/14/22  Yes Cranford, Archie Patten, NP  sertraline (ZOLOFT) 50 MG tablet Take  1 tablet  Daily  for Mood  / Chronic Anxiety 10/23/22  Yes Maurio Baize, Eilleen Kempf, MD  fluticasone [facility]) 50 MCG/ACT nasal spray Place 2 sprays into both nostrils daily. 12/18/21 12/18/22  Raynelle Dick, NP  hydrocortisone (ANUSOL-HC) 2.5 % rectal cream Place 1 Application rectally 2 (two) times daily. Patient not taking: Reported on 07/14/2023 04/30/22   Long, Arlyss Repress, MD  lipase/protease/amylase (CREON) 12000-38000 units CPEP capsule Take 12,000 Units by mouth 3 (three) times daily with meals. Patient not  taking: Reported on 07/14/2023    [provider]  QUEtiapine (SEROQUEL) 50 MG tablet Take 1 tablet (50 mg total) by mouth 2 (two) times daily. 07/14/23 07/08/24  Georgina Quint, MD  senna-docusate (SENOKOT-S) 8.6-50 MG tablet Take 1 tablet by mouth at bedtime as needed for mild constipation. Patient not taking: Reported on 07/14/2023 04/30/22   Long, Arlyss Repress, MD  tamsulosin (FLOMAX) 0.4 MG CAPS capsule Take 1 capsule (0.4 mg total) by mouth daily after supper. For prostate. 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]     Positive test in 2004    Patient Active Problem List   Diagnosis Date Noted   Neoplasm of uncertain behavior of lymphoid, hematopoietic and related tissue, unspecified (HCC) 02/17/2023   Pulmonary emphysema, unspecified emphysema type (HCC) 02/17/2023   Memory loss 10/29/2021   Pancreatic insufficiency 08/20/2021   Left-sided weakness 08/10/2021   Generalized weakness 08/09/2021   Personal history of CLL (chronic lymphocytic leukemia) 08/09/2021   Pancytopenia (HCC) 08/09/2021   At high risk for falls 04/29/2021   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) 03/28/2021   Sleeping difficulty 03/28/2021   History of lacunar cerebrovascular accident 01/09/2021   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  BPH with obstruction/lower urinary tract symptoms 01/09/2021   First degree AV block 01/09/2021   Gait abnormality 12/20/2020   BMI 26.0-26.9,adult 03/06/2020   Gastric AVM 03/06/2020   History of colon polyps 03/06/2020   Aortic atherosclerosis (HCC) - per CT 04/2016 08/23/2019   Memory changes 05/25/2019   Anxiety 02/08/2018   CLL (chronic lymphocytic leukemia) (HCC) 11/01/2016   GERD  02/09/2015    Dyslipidemia    Hypertension    Vitamin D deficiency    Diverticulosis     Past Medical History:  Diagnosis Date   Diverticulosis    Hyperlipidemia    Hypertension    Memory loss    Prediabetes    Vitamin D deficiency     Past Surgical History:  Procedure Laterality Date   NO PAST SURGERIES      Social History   Socioeconomic History   Marital status: Married    Spouse name: Not on file   Number of children: 5   Years of education: 9th grade   Highest education level: Not on file  Occupational History   Occupation: retired  Tobacco Use   Smoking status: Former    Current packs/day: 0.00    Types: Cigarettes    Quit date: 01/17/2003    Years since quitting: 20.5   Smokeless tobacco: Never  Vaping Use   Vaping status: Never Used  Substance and Sexual Activity   Alcohol use: Not Currently    Comment: very rare   Drug use: No   Sexual activity: Not Currently  Other Topics Concern   Not on file  Social History Narrative   Lives at home with his wife.   Right-handed.   At most, one cup caffeine per day.    Social Determinants of Health   Financial Resource Strain: Low Risk  (05/05/2023)   Overall Financial Resource Strain (CARDIA)    Difficulty of Paying Living Expenses: Not hard at all  Food Insecurity: No Food Insecurity (05/05/2023)   Hunger Vital Sign    Worried About Running Out of Food in the Last Year: Never true    Ran Out of Food in the Last Year: Never true  Transportation Needs: No Transportation Needs (05/05/2023)   PRAPARE - Administrator, Civil Service (Medical): No    Lack of Transportation (Non-Medical): No  Physical Activity: Insufficiently Active (05/05/2023)   Exercise Vital Sign    Days of Exercise per Week: 1 day    Minutes of Exercise per Session: 40 min  Stress: No Stress Concern Present (05/05/2023)   [employer] of Occupational Health - Occupational Stress Questionnaire    Feeling of Stress : Not at all  Social  Connections: Socially Integrated (05/05/2023)   Social Connection and Isolation Panel [NHANES]    Frequency of Communication with Friends and Family: More than three times a week    Frequency of Social Gatherings with Friends and Family: More than three times a week    Attends Religious Services: More than 4 times per year    Active Member of [employer] or Organizations: Yes    Attends Banker Meetings: More than 4 times per year    Marital Status: Married  Catering manager Violence: Not At Risk (05/05/2023)   Humiliation, Afraid, Rape, and Kick questionnaire    Fear of Current or Ex-Partner: No    Emotionally Abused: No    Physically Abused: No    Sexually Abused: No    Family History  Problem Relation  Age of Onset   Stroke Mother    Early death Father        Farm/tractor accident   Diabetes Brother    Cirrhosis Brother    Colon cancer Paternal Grandfather      Review of Systems  Constitutional: Negative.  Negative for chills and fever.  HENT:  Negative for congestion and sore throat.   Respiratory: Negative.  Negative for cough and shortness of breath.   Cardiovascular: Negative.  Negative for chest pain and palpitations.  Gastrointestinal:  Negative for abdominal pain, diarrhea, nausea and vomiting.  Skin: Negative.  Negative for rash.  Neurological: Negative.  Negative for dizziness and headaches.  All other systems reviewed and are negative.   Vitals:   07/14/23 1435  BP: 128/88  Pulse: (!) 58  Temp: 98 F (36.7 C)  SpO2: 96%    Physical Exam Vitals reviewed.  Constitutional:      Appearance: Normal appearance.  HENT:     Head: Normocephalic.     Mouth/Throat:     Mouth: Mucous membranes are moist.     Pharynx: Oropharynx is clear.  Eyes:     Extraocular Movements: Extraocular movements intact.     Pupils: Pupils are equal, round, and reactive to light.  Cardiovascular:     Rate and Rhythm: Normal rate and regular rhythm.     Pulses: Normal  pulses.     Heart sounds: Normal heart sounds.  Pulmonary:     Effort: Pulmonary effort is normal.     Breath sounds: Normal breath sounds.  Abdominal:     Palpations: Abdomen is soft.     Tenderness: There is no abdominal tenderness.  Musculoskeletal:     Cervical back: No tenderness.  Lymphadenopathy:     Cervical: No cervical adenopathy.  Skin:    General: Skin is warm and dry.     Capillary Refill: Capillary refill takes less than 2 seconds.  Neurological:     Mental Status: He is alert. Mental status is at baseline.      ASSESSMENT & PLAN: A total of 43 minutes was spent with the patient and counseling/coordination of care regarding preparing for this visit, review of most recent office visit notes, review of multiple chronic medical conditions under management, review of all medications and changes made, review of most recent blood work results, prognosis, documentation and need for follow-up.  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 Also continue Zoloft 50 mg daily and BuSpar 5 mg 2-3 times a day Continues daily baby aspirin      Relevant Medications   QUEtiapine (SEROQUEL) 50 MG tablet   Other Relevant Orders   Flu Vaccine Trivalent High Dose (Fluad) (Completed)     Other   Dyslipidemia    Chronic stable condition Continue rosuvastatin 20 mg daily.      Personal history of CLL (chronic lymphocytic leukemia)    Stable.  Sees hematologist on a regular basis      Patient Instructions  Dementia Caregiver Guide Dementia is a condition that affects the way  the brain works. It  often affects thinking and memory. A person with dementia may: Forget things. Have trouble talking or responding to your questions. Have trouble paying attention. Have trouble thinking clearly and making good decisions. Get lost or wander away from home or other places. Have big changes in their mood or emotions. They may: Feel very worried, nervous, or depressed. Have angry outbursts. Be suspicious or accuse you of things. Have childlike behavior and language. Taking care of someone with dementia can be a challenge. The tips below can help you care for the person. How to help manage lifestyle changes Dementia usually gets worse slowly over time. In the early stages, people with dementia can stay safe and take care of themselves with some help. In later stages, they need help with daily tasks like getting dressed, grooming, and going to the bathroom. Communicating When the person is talking and seems frustrated, make eye contact and hold the person's hand. Ask questions that can be answered with a yes or no. Use simple words and a calm voice. Only give one direction at a time. Limit choices for the person. Too many choices can be stressful. Avoid correcting the person in a negative way. If the person can't find the right words, gently try to help. Preventing injury  Keep floors clear. Remove rugs, magazine racks, and floor lamps. Keep hallways well lit, especially at night. Put a handrail and nonslip mat in the bathtub or shower. Put childproof locks on cabinets that have dangerous items in them. These items include medicine, alcohol, guns, cleaning products, and sharp tools. For doors to the outside, put locks where the person can't see or reach them. This helps keep the person from going out of the house and getting lost. Be ready for emergencies. Keep a list of emergency phone numbers and addresses close by. Remove car keys and lock garage doors so the person doesn't try to  drive. Have the person wear a bracelet that tracks where they are and shows that they're a person with memory loss. This should be worn at all times for safety. Helping with daily life  Keep the person on track with their daily routine. Try to identify areas where the person may need help. Be supportive, patient, calm, and encouraging. Gently remind the person that adjusting to changes takes time. Help with the tasks that the person has asked for help with. Keep the person involved in daily tasks and decisions as much as you can. Encourage conversation, but try not to get frustrated if the person struggles to find words or doesn't seem to appreciate your help. Other tips Think about any safety risks and take steps to avoid them. Keep things organized: Organize medicines in a pill box for each day of the week. Keep a calendar in a central place. Use it to remind the person of health care visits or other activities. Create a plan to handle any legal or financial matters. Get help from a professional if needed. Help make sure the person: Takes medicines only as told by their health care providers. Eats regular, healthy meals. They should also drink plenty of fluids. Goes to all scheduled health care appointments. Gets regular sleep. Taking care of yourself Being a caregiver for someone with dementia can be hard. You may feel stressed and have many other emotions. It's important to also take care of yourself. Here are some tips: Find out about services that can provide short-term care for the person. This is called  respite care. It can allow you to take a break when you need one. Find healthy ways to deal with stress. Some ways include: Spending time with other people. Exercising. Meditating or doing deep breathing exercises. Take care of your own health by: Getting enough sleep. Eating healthy foods. Getting regular exercise. Join a support group with others who are caregivers. These  groups can help you: Learn other ways to deal with stress. Share experiences with others. Get emotional comfort and support. Learn about caregiving as the disease gets worse. Find resources in your community. Where to find support: Many people and organizations offer support. These include: Support groups for people with dementia. Support groups for caregivers. Counselors or therapists. Home health care services. Adult day care centers. Where to find more information Alzheimer's Association: [employer] Family Caregiver Alliance: caregiver.org Alzheimer's Foundation of [place]: alzfdn.org Contact a health care provider if: The person's health is quickly getting worse. You're no longer able to care for the person. Caring for the person is affecting your physical and emotional health. You're feeling worried, nervous, or depressed about caring for the person. Get help right away if: You feel like the person may hurt themselves or others. The person has talked about taking their own life. These symptoms may be an emergency. Take one of these steps right away: Go to your nearest emergency room. Call 911. Call the National Suicide Prevention Lifeline at (774)025-2181 or 988. Text the Crisis Text Line at 971-580-7774. This information is not intended to replace advice given to you by your health care provider. Make sure you discuss any questions you have with your health care provider. Document Revised: 12/26/2022 Document Reviewed: 12/26/2022 Elsevier Patient Education  2024 Elsevier Inc.     Edwina Barth, MD [facility]

## 2023-07-14 NOTE — Assessment & Plan Note (Addendum)
Recommend to increase dose of Seroquel to 50 mg twice a day. Also continue Zoloft 50 mg daily and BuSpar 5 mg 2-3 times a day Continues daily baby aspirin

## 2023-07-14 NOTE — Assessment & Plan Note (Signed)
BP Readings from Last 3 Encounters:  07/14/23 128/88  03/26/23 136/80  03/24/23 (!) 182/87  Well-controlled hypertension Continue losartan 100 mg daily

## 2023-07-14 NOTE — Assessment & Plan Note (Signed)
Stable.  Sees hematologist on a regular basis.

## 2023-07-14 NOTE — Patient Instructions (Signed)
Dementia Caregiver Guide Dementia is a condition that affects the way the brain works. It often affects thinking and memory. A person with dementia may: Forget things. Have trouble talking or responding to your questions. Have trouble paying attention. Have trouble thinking clearly and making good decisions. Get lost or wander away from home or other places. Have big changes in their mood or emotions. They may: Feel very worried, nervous, or depressed. Have angry outbursts. Be suspicious or accuse you of things. Have childlike behavior and language. Taking care of someone with dementia can be a challenge. The tips below can help you care for the person. How to help manage lifestyle changes Dementia usually gets worse slowly over time. In the early stages, people with dementia can stay safe and take care of themselves with some help. In later stages, they need help with daily tasks like getting dressed, grooming, and going to the bathroom. Communicating When the person is talking and seems frustrated, make eye contact and hold the person's hand. Ask questions that can be answered with a yes or no. Use simple words and a calm voice. Only give one direction at a time. Limit choices for the person. Too many choices can be stressful. Avoid correcting the person in a negative way. If the person can't find the right words, gently try to help. Preventing injury  Keep floors clear. Remove rugs, magazine racks, and floor lamps. Keep hallways well lit, especially at night. Put a handrail and nonslip mat in the bathtub or shower. Put childproof locks on cabinets that have dangerous items in them. These items include medicine, alcohol, guns, cleaning products, and sharp tools. For doors to the outside, put locks where the person can't see or reach them. This helps keep the person from going out of the house and getting lost. Be ready for emergencies. Keep a list of emergency phone numbers and  addresses close by. Remove car keys and lock garage doors so the person doesn't try to drive. Have the person wear a bracelet that tracks where they are and shows that they're a person with memory loss. This should be worn at all times for safety. Helping with daily life  Keep the person on track with their daily routine. Try to identify areas where the person may need help. Be supportive, patient, calm, and encouraging. Gently remind the person that adjusting to changes takes time. Help with the tasks that the person has asked for help with. Keep the person involved in daily tasks and decisions as much as you can. Encourage conversation, but try not to get frustrated if the person struggles to find words or doesn't seem to appreciate your help. Other tips Think about any safety risks and take steps to avoid them. Keep things organized: Organize medicines in a pill box for each day of the week. Keep a calendar in a central place. Use it to remind the person of health care visits or other activities. Create a plan to handle any legal or financial matters. Get help from a professional if needed. Help make sure the person: Takes medicines only as told by their health care providers. Eats regular, healthy meals. They should also drink plenty of fluids. Goes to all scheduled health care appointments. Gets regular sleep. Taking care of yourself Being a caregiver for someone with dementia can be hard. You may feel stressed and have many other emotions. It's important to also take care of yourself. Here are some tips: Find out about services that  can provide short-term care for the person. This is called respite care. It can allow you to take a break when you need one. Find healthy ways to deal with stress. Some ways include: Spending time with other people. Exercising. Meditating or doing deep breathing exercises. Take care of your own health by: Getting enough sleep. Eating healthy  foods. Getting regular exercise. Join a support group with others who are caregivers. These groups can help you: Learn other ways to deal with stress. Share experiences with others. Get emotional comfort and support. Learn about caregiving as the disease gets worse. Find resources in your community. Where to find support: Many people and organizations offer support. These include: Support groups for people with dementia. Support groups for caregivers. Counselors or therapists. Home health care services. Adult day care centers. Where to find more information Alzheimer's Association: [employer] Family Caregiver Alliance: caregiver.org Alzheimer's Foundation of [place]: alzfdn.org Contact a health care provider if: The person's health is quickly getting worse. You're no longer able to care for the person. Caring for the person is affecting your physical and emotional health. You're feeling worried, nervous, or depressed about caring for the person. Get help right away if: You feel like the person may hurt themselves or others. The person has talked about taking their own life. These symptoms may be an emergency. Take one of these steps right away: Go to your nearest emergency room. Call 911. Call the National Suicide Prevention Lifeline at 782-808-8741 or 988. Text the Crisis Text Line at 318-052-2287. This information is not intended to replace advice given to you by your health care provider. Make sure you discuss any questions you have with your health care provider. Document Revised: 12/26/2022 Document Reviewed: 12/26/2022 Elsevier Patient Education  2024 [employer].

## 2023-07-14 NOTE — Assessment & Plan Note (Signed)
Chronic stable condition Continue rosuvastatin 20 mg daily.

## 2023-09-08 DIAGNOSIS — M25512 Pain in left shoulder: Secondary | ICD-10-CM | POA: Diagnosis not present

## 2023-09-09 ENCOUNTER — Telehealth: Payer: Self-pay

## 2023-09-09 NOTE — Patient Outreach (Signed)
  Care Coordination   Initial Visit Note   09/09/2023 Name: Javier [name] MRN: [id number] DOB: 1941/02/21  Javier [name] is a 82 y.o. year old male who sees Javier [name], Javier Kempf, MD for primary care. I spoke with Spouse Javier [name] by phone today.  What matters to the patients health and wellness today?  RNCM called to assess if any care management needs. Patient with history of Dementia, HTN, pulmonary emphysema, CVA. Ms. Javier [name] reports patient is doing, "pretty good". She reports he is just weak and still receiving home health physical therapy. She states she has support through the [place] with in home aid assistance. Ms. Javier [name] states she is interested in getting patient a wheelchair either regular or motorized chair if possible. She states she feels patient would be able to operate motorized chair. She states she will discuss with patient physical therapist at next visit.  Goals Addressed             This Visit's Progress    COMPLETED: Community resources       Care Coordination Interventions: Confirmed with patient's wife that home health agency is scheduled to see patient Provided care guide's contact number re: follow up of socialization resources. Message also sent to care guide that patient wife is still interested in resources Encouraged spouse to contact RN Care Manager as needed for care coordination needs     Health Management       Interventions Today    Flowsheet Row Most Recent Value  Chronic Disease   Chronic disease during today's visit Hypertension (HTN)  General Interventions   General Interventions Discussed/Reviewed General Interventions Reviewed, Doctor Visits, Durable Medical Equipment (DME)  Doctor Visits Discussed/Reviewed Doctor Visits Reviewed, PCP  Durable Medical Equipment (DME) Wheelchair, Dan Humphreys  [discussed process for obtaining wheelchair or motorized chair. encouraged spouse to speak with patient's physical therapist and [facility] case manager to get  appropriate chair patient and approval.]  PCP/Specialist Visits Compliance with follow-up visit  [discussed upcoming appointments]  Exercise Interventions   Exercise Discussed/Reviewed Exercise Discussed  [confirmed patient is active with [facility] for PT]  Education Interventions   Education Provided Provided Education  Provided Verbal Education On Exercise, Medication, When to see the doctor  Pharmacy Interventions   Pharmacy Dicussed/Reviewed Pharmacy Topics Discussed  Safety Interventions   Safety Discussed/Reviewed Fall Risk, Home Safety  Home Safety Assistive Devices  [confirmed active with home health [facility]]            SDOH assessments and interventions completed:  Yes recently completed within the past 4 months. Per spouse- no changes.   Care Coordination Interventions:  Yes, provided   Follow up plan: Follow up call scheduled for 07/30/23    Encounter Outcome:  Patient Visit Completed   Javier Sheriff, RN, MSN, BSN, CCM Care Management Coordinator [phone]

## 2023-09-09 NOTE — Patient Instructions (Signed)
Visit Information  Thank you for taking time to visit with me today. Please don't hesitate to contact me if I can be of assistance to you.   Following are the goals we discussed today:  Discuss wheelchair request and recommendations with physical therapist and [facility] case manager. Continue to take medications as prescribed. Continue to attend provider visits as scheduled Continue to eat healthy, lean meats, vegetables, fruits, avoid saturated and transfats Contact provider with health questions or concerns as needed  Our next appointment is by telephone on 09/29/23 at 10:00 am  Please call the care guide team at [phone] if you need to cancel or reschedule your appointment.   If you are experiencing a Mental Health or Behavioral Health Crisis or need someone to talk to, please call the Suicide and Crisis Lifeline: 988 call the [place] National Suicide Prevention Lifeline: 4317977834 or TTY: 534 755 3543 TTY [phone]) to talk to a trained counselor   Kathyrn Sheriff, RN, MSN, BSN, CCM Care Management Coordinator [phone]

## 2023-09-16 IMAGING — MR MR CERVICAL SPINE W/O CM
5 series · 39 of 48 positions shown · non-contrast
Comparison: CT from yesterday

CLINICAL DATA: Acute or progressive myelopathy

EXAM:
MRI CERVICAL SPINE WITHOUT CONTRAST
TECHNIQUE: Multiplanar, multisequence MR imaging of the cervical spine was
performed. No intravenous contrast was administered.

[Series 5: T2 · sagittal · 3.0mm · 0.69mm/px · 4 of 12 slices shown (1 of 2)]
[im 1/12]
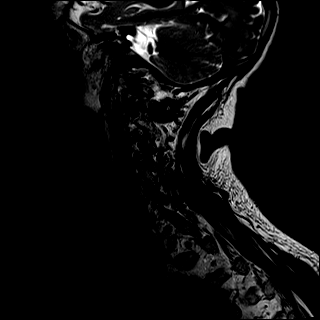
[im 4/12]
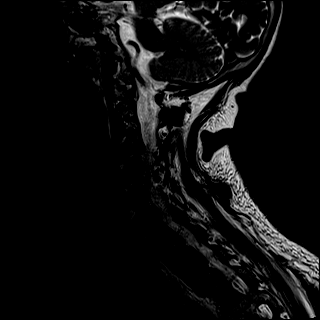
[im 8/12]
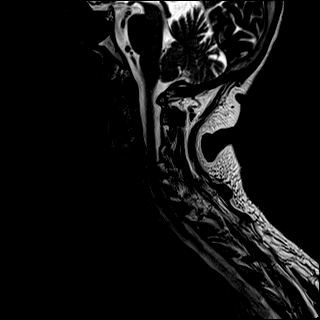
[im 12/12]
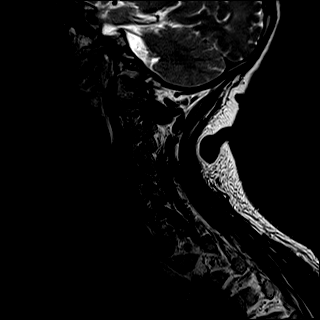

[Series 6: T1 · sagittal · 3.0mm · 0.69mm/px · 5 of 12 slices shown]
[im 1/12]
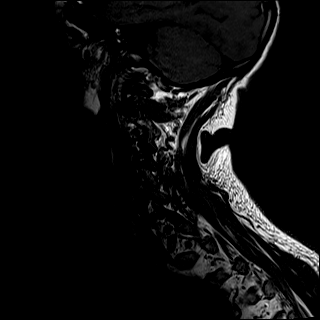
[im 3/12]
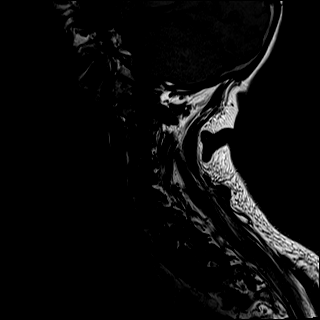
[im 6/12]
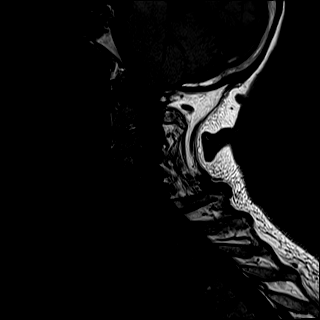
[im 9/12]
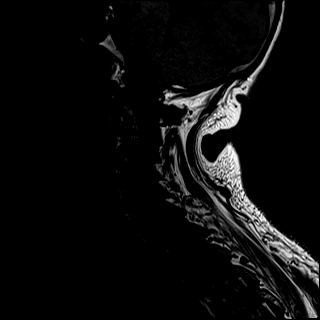
[im 12/12]
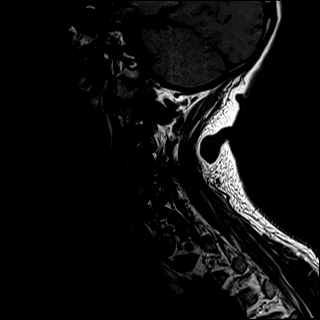

[Series 7: STIR · sagittal · 3.0mm · 0.86mm/px · 5 of 12 slices shown]
[im 1/12]
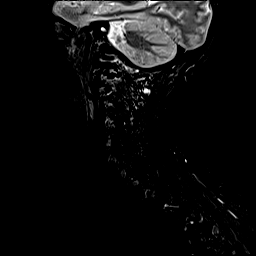
[im 3/12]
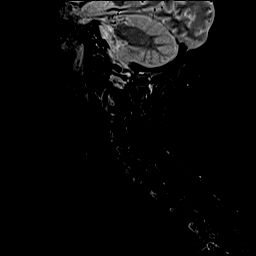
[im 6/12]
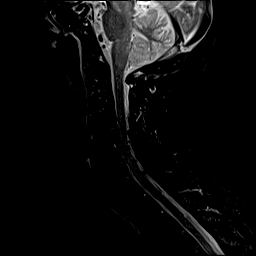
[im 9/12]
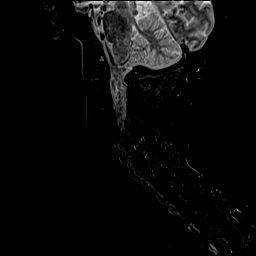
[im 12/12]
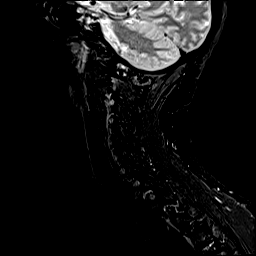

[Series 8: T2 · axial · 3.0mm · 0.66mm/px · z∈[-119,-0]mm · 17 of 40 slices shown (2 of 2)]
[im 1/40]
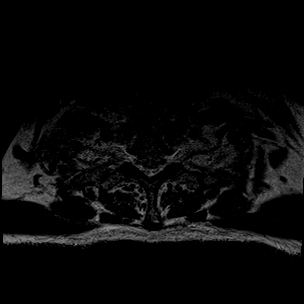
[im 3/40]
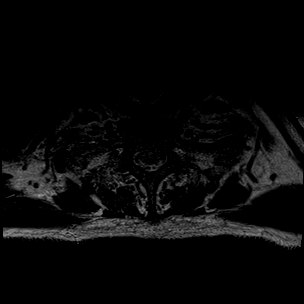
[im 5/40]
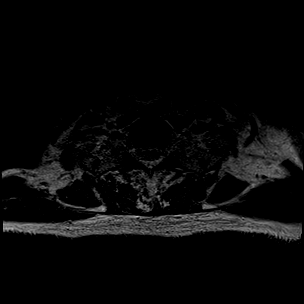
[im 8/40]
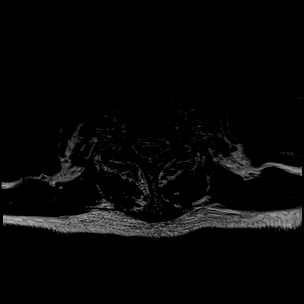
[im 10/40]
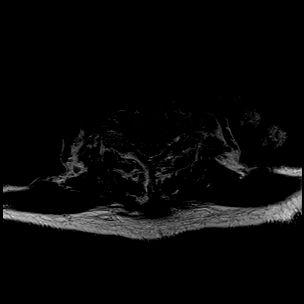
[im 13/40]
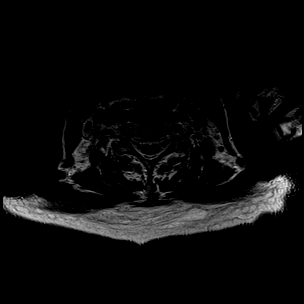
[im 15/40]
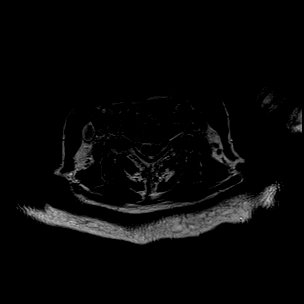
[im 18/40]
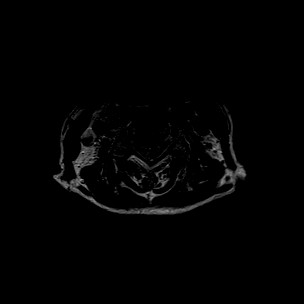
[im 20/40]
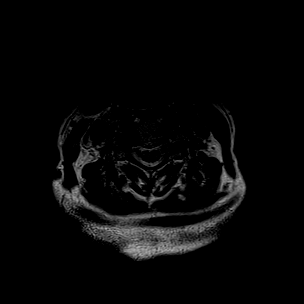
[im 22/40]
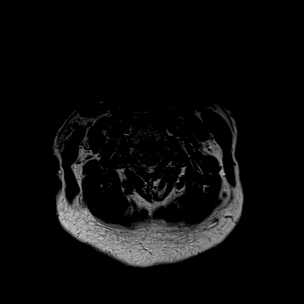
[im 25/40]
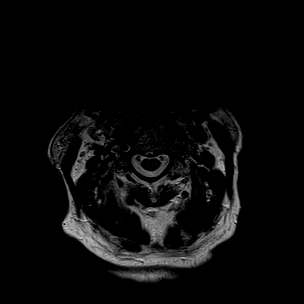
[im 27/40]
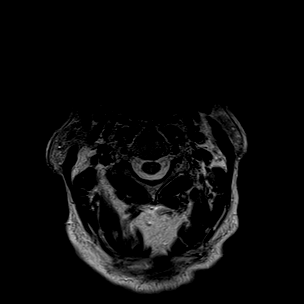
[im 30/40]
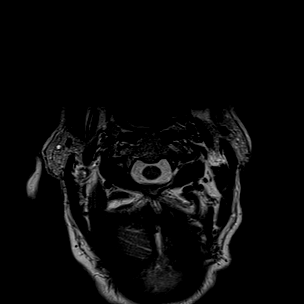
[im 32/40]
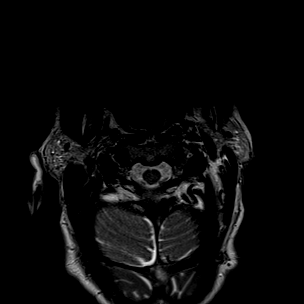
[im 35/40]
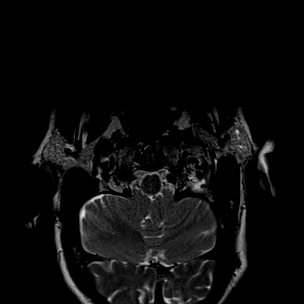
[im 37/40]
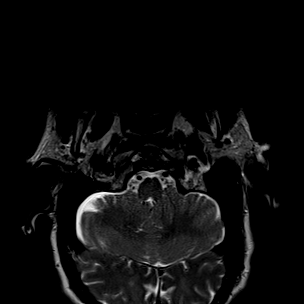
[im 40/40]
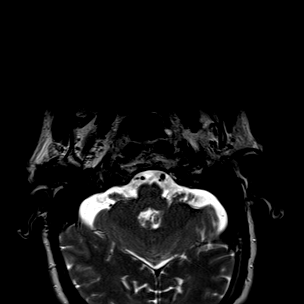

[Series 9: GRE · axial · 3.0mm · 0.39mm/px · z∈[-113,-9]mm · 8 of 40 slices shown]
[im 3/40]
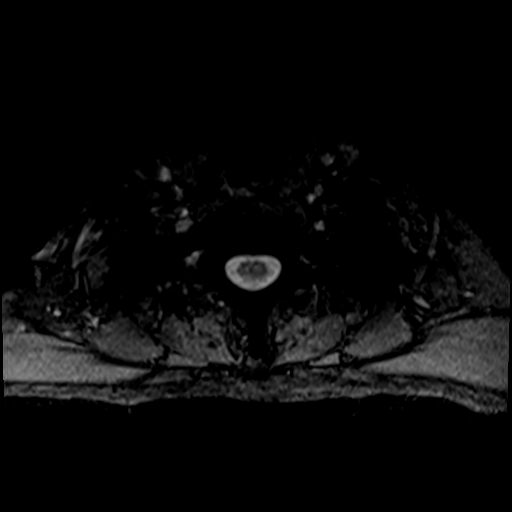
[im 8/40]
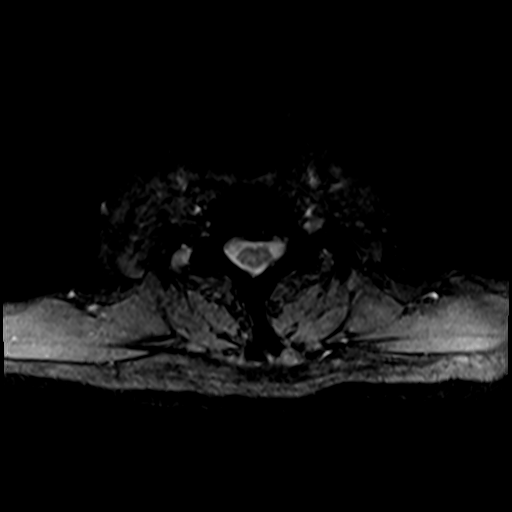
[im 13/40]
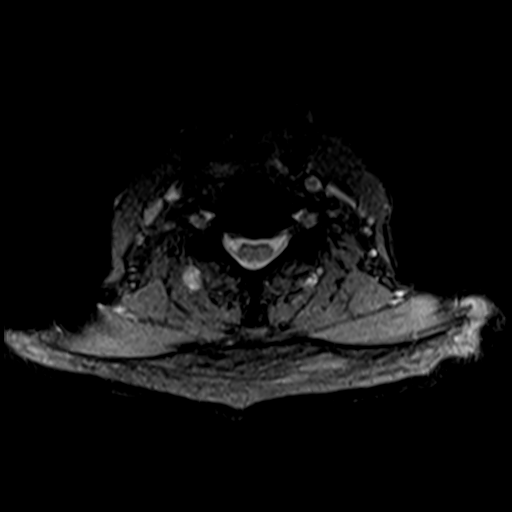
[im 18/40]
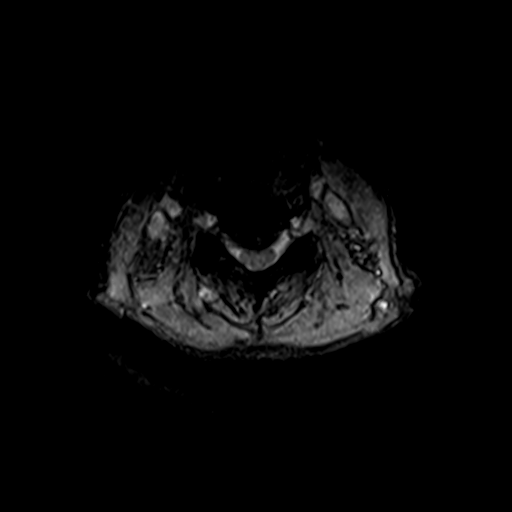
[im 22/40]
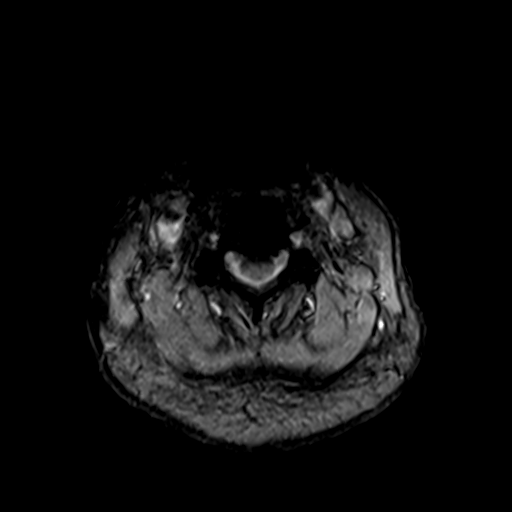
[im 27/40]
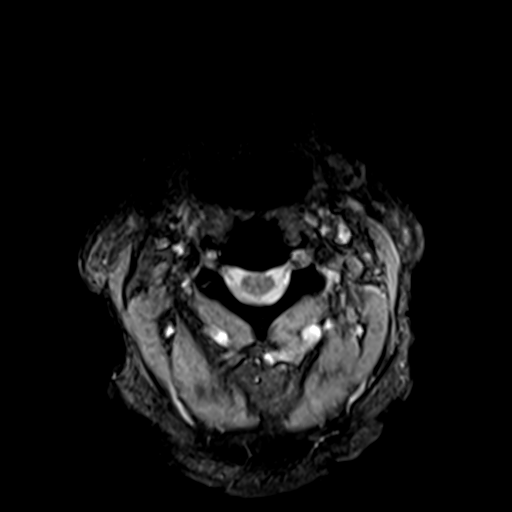
[im 32/40]
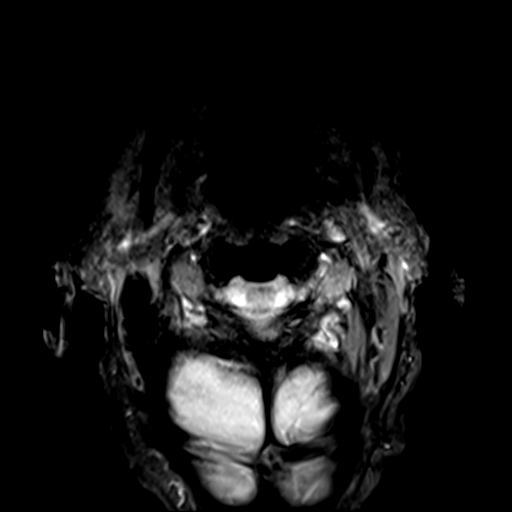
[im 37/40]
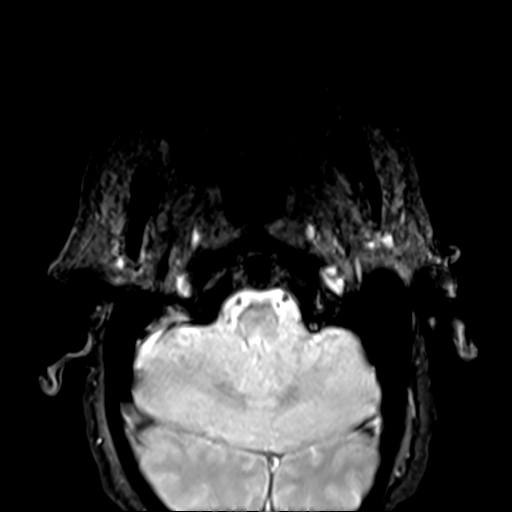

[39 of 48 positions shown; findings below may reference images not displayed]

FINDINGS: Alignment: Slight retrolisthesis at C3-4 and C4-5.

Vertebrae: No fracture, evidence of discitis, or bone lesion.

Cord: Normal signal and morphology.

Posterior Fossa, vertebral arteries, paraspinal tissues: Negative.

Disc levels:

C2-3: Narrow disc.  No impingement

C3-4: Narrow disc with endplate and uncovertebral ridging. Moderate
left foraminal stenosis

C4-5: Disc narrowing and bulging with uncovertebral ridging. Right
foraminal impingement and mild left foraminal stenosis

C5-6: Disc narrowing and bulging with uncovertebral ridging.
Moderate bilateral foraminal stenosis, greater on the right

C6-7: Disc narrowing and bulging with uncovertebral ridging
eccentric to the right. Biforaminal impingement worse on the right.

C7-T1:Mild disc narrowing
IMPRESSION: Ordinary cervical spine degeneration with multilevel foraminal
stenosis as described. No cord impingement or signal abnormality to
correlate with history of myelopathy.

## 2023-09-29 ENCOUNTER — Ambulatory Visit: Payer: Self-pay

## 2023-09-29 NOTE — Patient Outreach (Addendum)
  Care Coordination   Follow Up Visit Note   09/29/2023 Name: Javier [name] MRN: [id number] DOB: January 10, 1941  Javier [name] is a 82 y.o. year old male who sees Sagardia, Emil Schanz, MD for primary care. I spoke with  Javier Javier [name] by phone today.  What matters to the patients health and wellness today?  [name] reports she does not have much time to talk, but expresses that patient is in need of  equipment: description of a toilet seat with handles. RNCM questions if raised toilet seat with handles. She states patient continues to have home health physical therapy and reports therapist will be out to see patient today. Patient also continues to have in home aide assistance. RNCM called [facility] and spoke with physical therapist Powell Aw. Powell reports most of patient's falls are result of trying to get on of off the toilet and adds no place for grab bars. Powell states she has been trying to contact the [place], but has not received a return call. Powell states best option for patient is 3n1/BSC.   Goals Addressed             This Visit's Progress    Health Management        Interventions Today    Flowsheet Row Most Recent Value  Chronic Disease   Chronic disease during today's visit Hypertension (HTN)  General Interventions   General Interventions Discussed/Reviewed General Interventions Reviewed, Durable Medical Equipment (DME), Communication with  [Evaluation of current treatment plan for health condition and patient's adherence to plan.]  Durable Medical Equipment (DME) Wheelchair, Other  [encouraged spouse to discuss specific terminology for recommended equipement with therapist today. And  encouraged spouse to contact patient's social worker with the [facility] to see if VA will cover/provide.]  Wheelchair Standard  [[name] reports patient has wheelchair]  Communication with PCP/Specialists  [message sent to PCP requesting order/prescription for BSC/3n1]   Safety Interventions   Safety Discussed/Reviewed Home Safety, Fall Risk  Home Safety Assistive Devices, Contact home health agency  [contacted HHPT re: recommended equipment. developed plan with therapist: alternate plan to obtain BSC/3n1-RNCM request BSC order from PCP. Heather to update therapist visiting today to communicate with spouse and educate on [facility] use. Other option via [facility].]            SDOH assessments and interventions completed:  No  Care Coordination Interventions:  Yes, provided   Follow up plan: Follow up call scheduled for 10/08/23    Encounter Outcome:  Patient Visit Completed   Heddy Shutter, RN, MSN, BSN, CCM Care Management Coordinator [phone]

## 2023-09-29 NOTE — Patient Instructions (Signed)
 Visit Information  Thank you for taking time to visit with me today. Please don't hesitate to contact me if I can be of assistance to you.   Following are the goals we discussed today:  Per physical therapist recommendation: Bedside Commode(3n1). I have sent a request for your Primary care provider to send an order to a DME company. You also have the option to contact patient's VA provider or social worker to see if they will provide this equipment. Continue to work with home health therapist as recommended Continue to take medications as requested  Our next appointment is by telephone on 10/08/23 at 2:00 pm  Please call the care guide team at [phone] if you need to cancel or reschedule your appointment.   If you are experiencing a Mental Health or Behavioral Health Crisis or need someone to talk to, please call the Suicide and Crisis Lifeline: 988 call the [place]  National Suicide Prevention Lifeline: 216-094-5342 or TTY: 949-378-8479 TTY 612-370-9142) to talk to a trained counselor  Heddy Shutter, RN, MSN, BSN, CCM Care Management Coordinator [phone]

## 2023-10-04 ENCOUNTER — Other Ambulatory Visit: Payer: Self-pay | Admitting: Emergency Medicine

## 2023-10-04 DIAGNOSIS — F419 Anxiety disorder, unspecified: Secondary | ICD-10-CM

## 2023-10-08 ENCOUNTER — Telehealth: Payer: Self-pay

## 2023-10-08 NOTE — Patient Outreach (Signed)
  Care Coordination   10/08/2023 Name: Javier [name] MRN: [id number] DOB: 01-03-1941   Care Coordination Outreach Attempts:  An unsuccessful outreach was attempted for an appointment today.  Follow Up Plan:  Additional outreach attempts will be made to offer the patient complex care management information and services.   Encounter Outcome:  No Answer   Care Coordination Interventions:  No, not indicated  {THN Tip this will not be part of the note when signed-REQUIRED REPORT FIELD DO   Heddy Shutter, RN, MSN, BSN, CCM Care Management Coordinator [phone]

## 2023-10-09 ENCOUNTER — Other Ambulatory Visit: Payer: Self-pay | Admitting: Emergency Medicine

## 2023-10-09 DIAGNOSIS — F419 Anxiety disorder, unspecified: Secondary | ICD-10-CM

## 2023-10-22 ENCOUNTER — Ambulatory Visit: Payer: Self-pay

## 2023-10-22 NOTE — Patient Instructions (Signed)
Visit Information  Thank you for taking time to visit with me today. Please don't hesitate to contact me if I can be of assistance to you.   Following are the goals we discussed today:  Contact Primary care provider to schedule follow up appointment due this month Continue to take medications as prescribed. Continue to attend provider visits as scheduled Continue to eat healthy, lean meats, vegetables, fruits, avoid saturated and transfats Contact provider with health questions or concerns as needed Continue to work with the [place] regarding any equipments needs as needed   Our next appointment is by telephone on 12/22/23 at 1:30 pm  Please call the care guide team at [phone] if you need to cancel or reschedule your appointment.   If you are experiencing a Mental Health or Behavioral Health Crisis or need someone to talk to, please call the Suicide and Crisis Lifeline: 988 call the [place] National Suicide Prevention Lifeline: (818) 788-2382 or TTY: 815-872-0399 TTY (307) 379-0154) to talk to a trained counselor   Kathyrn Sheriff, RN, MSN, BSN, CCM [place]  [facility], Population Health Case Manager Phone: [phone]

## 2023-10-22 NOTE — Patient Outreach (Signed)
  Care Coordination   Follow Up Visit Note   10/22/2023 Name: Javier [name] MRN: [id number] DOB: [birth year]  Javier [name] is a 83 y.o. year old male who sees Sagardia, Eilleen Kempf, MD for primary care. I spoke with Caregiver/spouse Javier [name] by phone today.  What matters to the patients health and wellness today?  Mrs. Fago reports no change in patient's health condition. However, she feels patient is physically weak. She states she has obtained what sounds to [facility] as a 3n1 chair that she can use over the toilet or by the bed. Mrs Crouse states it is very helpful at night. Mrs. Omura states patient has a phone and helps to stay oriented to date/time by looking at his phone. Per review of chart, patient is due to a follow up appointment, but does not have one scheduled.  Goals Addressed             This Visit's Progress    Health Management        Interventions Today    Flowsheet Row Most Recent Value  Chronic Disease   Chronic disease during today's visit Hypertension (HTN), Other  [memory changes, weakness]  General Interventions   General Interventions Discussed/Reviewed General Interventions Reviewed, Doctor Visits  [Evaluation of current treatment plan for health condition and patient's adherence to plan]  Doctor Visits Discussed/Reviewed Doctor Visits Reviewed, PCP, Specialist  PCP/Specialist Visits Compliance with follow-up visit  [reviewed scheduled upcoming appointments. encouraged Mrs. Zeoli to schedule patient f/u 3 month appointment due this month]  Education Interventions   Education Provided Provided Education  Provided Verbal Education On When to see the doctor, Medication, Other  [advised to continue to take medications as prescribed, attend provider visits as schedule, contact provider with health questions as needed, make a list of what you want to discuss with provider such as medications/mobility etc.]  Mental Health Interventions   Mental Health  Discussed/Reviewed Other  [discussed socialization options ie [facility] and [facility]-Mrs. Samples reports patient likes to stay at home declines to go to places to socialize.]  Nutrition Interventions   Nutrition Discussed/Reviewed Nutrition Reviewed  Pharmacy Interventions   Pharmacy Dicussed/Reviewed Pharmacy Topics Reviewed  [medications reviewed]  Safety Interventions   Safety Discussed/Reviewed Safety Reviewed, Home Safety, Fall Risk  Home Safety Assistive Devices            SDOH assessments and interventions completed:  No  Care Coordination Interventions:  Yes, provided   Follow up plan: Follow up call scheduled for 12/22/23    Encounter Outcome:  Patient Visit Completed   Kathyrn Sheriff, RN, MSN, BSN, CCM [place]  [facility], Population Health Case Manager Phone: [phone]

## 2023-10-23 ENCOUNTER — Ambulatory Visit: Payer: Self-pay | Admitting: Emergency Medicine

## 2023-10-23 NOTE — Telephone Encounter (Signed)
  Chief Complaint: difficulty swallowing Symptoms: feels like knot in throat Frequency: constant  Disposition: [] ED /[] Urgent Care (no appt availability in office) / [x] Appointment(In office/virtual)/ []  [name]/ [] Home Care/ [] Refused Recommended Disposition /[] [facility]/ []  Follow-up with PCP Additional Notes: Pt complaining of what feels like knot in throat causing difficulty in swallowing. Wife [name] was speaking for pt. Pt has had this issue before but it has been a year or two. [name] said all tests were clear and no further treatment was prescribed. Pt has mild difficulty swallowing and burning. It doesn't happen every time or every day, but has been bothering him more. Pt denies all other symptoms. Per protocol, pt to be see within 2 weeks. Pt has appt for 1/27 at 1520 with PCP. RN gave care advice and wife understood.          Copied from CRM [id number]. Topic: Clinical - Red Word Triage >> Oct 23, 2023 10:25 AM [name] wrote: [facility] that prompted transfer to Nurse Triage: patients wife stated the patient is experiencing a lump in throat sensation and he is having difficulty swallowing, the difficulty swallowing has been present for a little while but the sensation of a lump or knot started this morning Reason for Disposition  Swallowing difficulty is a chronic symptom (recurrent or ongoing AND present > 4 weeks)  Answer Assessment - Initial Assessment Questions 1. DESCRIPTION: "Tell me more about this problem." "Are you  having trouble swallowing liquids, solids, or both?" "Any trouble with swallowing saliva (spit)?"     Ate breakfast, but feels like knot is there 2. SEVERITY: "How bad is the swallowing difficulty?"  (e.g., Scale 1-10; or mild, moderate, severe)   - MILD (0-3): Occasional swallowing difficulty; has trouble swallowing certain types of foods or liquids.   - MODERATE (4-7): Frequent swallowing difficulty; only able to swallow small  amounts of foods and fluids.   - SEVERE (8-10): Unable to swallow any foods, fluids, or saliva; sensation of "lump in throat" or "something stuck in throat", and frequent drooling or spitting may be present.     mild 3. ONSET: "When did the swallowing problems begin?"      unsure 4. CAUSE: "What do you think is causing the problem?"  (e.g., dry mouth, food or pill stuck in throat, mouth pain, sore throat, progression of disease process such as dementia or Parkinson's disease).      Unsure  5. CHRONIC or RECURRENT: "Is this a new problem for you?"  If No, ask: "How long have you had this problem?" (e.g., days, weeks, months)      Chronic- year or two  6. OTHER SYMPTOMS: "Do you have any other symptoms?" (e.g., chest pain, difficulty breathing, mouth sores, sore throat, swollen tongue, chest pain)     denies  Protocols used: Swallowing Difficulty-A-AH

## 2023-10-24 ENCOUNTER — Emergency Department (HOSPITAL_COMMUNITY): Payer: No Typology Code available for payment source

## 2023-10-24 ENCOUNTER — Encounter: Payer: Self-pay | Admitting: *Deleted

## 2023-10-24 ENCOUNTER — Other Ambulatory Visit: Payer: Self-pay

## 2023-10-24 ENCOUNTER — Emergency Department (HOSPITAL_COMMUNITY)
Admission: EM | Admit: 2023-10-24 | Discharge: 2023-10-24 | Disposition: A | Discharge status: Home / Self Care | Payer: No Typology Code available for payment source | Attending: Emergency Medicine | Admitting: Emergency Medicine

## 2023-10-24 ENCOUNTER — Ambulatory Visit
Admission: EM | Admit: 2023-10-24 | Discharge: 2023-10-24 | Disposition: A | Discharge status: Home / Self Care | Payer: No Typology Code available for payment source | Attending: Family Medicine | Admitting: Family Medicine

## 2023-10-24 ENCOUNTER — Encounter (HOSPITAL_COMMUNITY): Payer: Self-pay | Admitting: *Deleted

## 2023-10-24 DIAGNOSIS — F0150 Vascular dementia without behavioral disturbance: Secondary | ICD-10-CM | POA: Diagnosis not present

## 2023-10-24 DIAGNOSIS — I10 Essential (primary) hypertension: Secondary | ICD-10-CM | POA: Insufficient documentation

## 2023-10-24 DIAGNOSIS — R1310 Dysphagia, unspecified: Secondary | ICD-10-CM | POA: Insufficient documentation

## 2023-10-24 DIAGNOSIS — Z856 Personal history of leukemia: Secondary | ICD-10-CM | POA: Insufficient documentation

## 2023-10-24 LAB — BASIC METABOLIC PANEL
Anion gap: 11 (ref 5–15)
BUN: 17 mg/dL (ref 8–23)
CO2: 23 mmol/L (ref 22–32)
Calcium: 10.1 mg/dL (ref 8.9–10.3)
Chloride: 104 mmol/L (ref 98–111)
Creatinine, Ser: 1.07 mg/dL (ref 0.61–1.24)
GFR, Estimated: >60 mL/min (ref >60)
Glucose, Bld: 94 mg/dL (ref 70–99)
Potassium: 3.8 mmol/L (ref 3.5–5.1)
Sodium: 138 mmol/L (ref 135–145)

## 2023-10-24 LAB — CBC WITH DIFFERENTIAL/PLATELET
Abs Immature Granulocytes: 0.01 10*3/uL (ref 0.00–0.07)
Basophils Absolute: 0 10*3/uL (ref 0.0–0.1)
Basophils Relative: 1 %
Eosinophils Absolute: 0 10*3/uL (ref 0.0–0.5)
Eosinophils Relative: 1 %
HCT: 38.2 % — ABNORMAL LOW (ref 39.0–52.0)
Hemoglobin: 12.4 g/dL — ABNORMAL LOW (ref 13.0–17.0)
Immature Granulocytes: 0 %
Lymphocytes Relative: 36 %
Lymphs Abs: 2.2 10*3/uL (ref 0.7–4.0)
MCH: 30.8 pg (ref 26.0–34.0)
MCHC: 32.5 g/dL (ref 30.0–36.0)
MCV: 95 fL (ref 80.0–100.0)
Monocytes Absolute: 0.5 10*3/uL (ref 0.1–1.0)
Monocytes Relative: 8 %
Neutro Abs: 3.4 10*3/uL (ref 1.7–7.7)
Neutrophils Relative %: 54 %
Platelets: 146 10*3/uL — ABNORMAL LOW (ref 150–400)
RBC: 4.02 MIL/uL — ABNORMAL LOW (ref 4.22–5.81)
RDW: 13.4 % (ref 11.5–15.5)
WBC: 6.1 10*3/uL (ref 4.0–10.5)
nRBC: 0 % (ref 0.0–0.2)

## 2023-10-24 MED ORDER — LIDOCAINE VISCOUS HCL 2 % MT SOLN
15.0000 mL | Freq: Once | OROMUCOSAL | Status: AC
  Administered 2023-10-24: 15 mL via OROMUCOSAL

## 2023-10-24 MED ORDER — ALUM & MAG HYDROXIDE-SIMETH 200-200-20 MG/5ML PO SUSP
30.0000 mL | Freq: Once | ORAL | Status: AC
  Administered 2023-10-24: 30 mL via ORAL

## 2023-10-24 NOTE — ED Notes (Addendum)
Dr. Rolland Bimler at bedside.

## 2023-10-24 NOTE — ED Triage Notes (Signed)
Difficulty swallowing for awhile  worse since Friday  family at  the bedside

## 2023-10-24 NOTE — ED Notes (Signed)
Patient is being discharged from the Urgent Care and sent to the Emergency Department via POV . Per Dr. Tracie Harrier, patient is in need of higher level of care due to difficulty swallowing. Patient and family are aware and verbalizes understanding of plan of care.  Vitals:   10/24/23 1419  BP: 124/72  Pulse: 66  Resp: 18  Temp: 98.2 F (36.8 C)  SpO2: 99%

## 2023-10-24 NOTE — ED Provider Triage Note (Addendum)
Emergency Medicine Provider Triage Evaluation Note  Javier [name] , a 83 y.o. male  was evaluated in triage.  Pt complains of worsening dysphagia. Has been happening for last 6 months. Dysphagia worse since yesterday where he coughs when eating and drinking fluids. He is able to tolerate saliva. Ate his full breakfast this morning and tolerated it. "Feels like a knot" in the throat. He has had episodes for the last 6 months where he vomits 1-2x/week. States he has been more fatigued recently.   Denies fevers, cough, chest pain, dyspnea, nausea, diarrhea.   Review of Systems  Positive: See above Negative: See above  Physical Exam  BP (!) 140/74 (BP Location: Right Arm)   Pulse 61   Temp 98.6 F (37 C)   Resp 16   Ht 5\' 11"  (1.803 m)   Wt 67.4 kg   SpO2 100%   BMI 20.72 kg/m  Gen:   Awake, no distress   Resp:  Normal effort  MSK:   Moves extremities without difficulty  Other:    Medical Decision Making  Medically screening exam initiated at 3:52 PM.  Appropriate orders placed.  Javier [name] was informed that the remainder of the evaluation will be completed by another provider, this initial triage assessment does not replace that evaluation, and the importance of remaining in the ED until their evaluation is complete.     Lunette Stands, PA-C 10/24/23 1601    Baldo Ash [place], [place] 10/24/23 1601

## 2023-10-24 NOTE — ED Notes (Signed)
Patient transported to CT via stretcher.

## 2023-10-24 NOTE — ED Triage Notes (Addendum)
Pt has a hx of swallowing difficulties per family. Since Thursday he has been c/o "a knot" in his throat and fells "like there might be something in there". C/o burning when he swallows. Wife also reports at times what he eats "comes back up".

## 2023-10-24 NOTE — Discharge Instructions (Addendum)
You were seen in the emergency department for difficulty swallowing.  Tests performed while you are here include blood work, CT scan.  These test showed no emergent or life-threatening causes of your symptoms.  Please follow-up with your PCP as scheduled on Monday.  We have additionally provided information for you to follow-up with a GI physician.  You can call the above phone number for Dr. Chales Abrahams to arrange your follow-up.  You may additionally benefit from your PCP placing referral for speech-language pathologist.\If you experience significant increase symptoms, including inability to swallow your own spit, repeated vomiting, inability keep food or drink down, shortness of breath, or any other concerns, return to the ED for evaluation.

## 2023-10-24 NOTE — ED Provider Notes (Signed)
EMERGENCY DEPARTMENT AT [facility] Provider Note  MDM   HPI/ROS:  Javier [name] is a 83 y.o. male with pertinent past medical history of CLL, pancytopenia, vascular dementia, GERD, diverticulosis, hypertension who presents for evaluation of dysphagia.  Patient's wife reports that he has been having some mild dysphagia for quite some time, however yesterday he told her that it feels as if he has a "knot" in the back of his throat when he tries to swallow or eat.  Since this time, she has been concerned over worsening dysphagia, stating that he will sometimes spit up mucus midway through a meal and appears to have decreased appetite.  Patient's wife and daughter feel that he has been mumbling more than normal recently, and has mildly hoarse voice.  They deny any neck swelling or other asymmetry.  Symptoms tend to be worse with solid foods, however cannot identify any other inciting or aggravating factors.  Of note, patient is a former tobacco user but quit about 20 years ago.  He previously smoked for roughly 30 years.  He has an appoint with his PCP on 1/27.  Per chart review, patient was previously evaluated for similar symptoms with EGD and barium swallow.  These tests did not show any acute pathology, though he was noted to have mild oropharyngeal delay.   Physical exam is notable for: - Frail, cachectic appearance -- Normal voice, tolerating secretions well, no evidence of respiratory distress -- No lymphadenopathy No neck swelling--  -- No lesions, masses, or other abnormalities in the posterior oropharynx or tongue  On my initial evaluation, patient is:  -Vital signs stable.*** Patient afebrile***, hemodynamically stable***, and non-toxic appearing.*** -Additional history obtained from ***  This patient's current presentation, including their history and physical exam, is most consistent with ***. Differentials include ***.     Interpretations, interventions, and  the patient's course of care are documented below.      ***   Disposition:  {ED Dispo:29898}  Clinical Impression: No diagnosis found.  Rx / DC Orders ED Discharge Orders     None       The plan for this patient was discussed with Dr. ***, who voiced agreement and who oversaw evaluation and treatment of this patient.   Clinical Complexity A medically appropriate history, review of systems, and physical exam was performed.  My independent interpretations of EKG, labs, and radiology are documented in the ED course above.   If decision rules were used in this patient's evaluation, they are listed below.  *** Click here for ABCD2, HEART and other calculatorsREFRESH Note before signing   Patient's presentation is most consistent with {EM COPA:27473}  Medical Decision Making Amount and/or Complexity of Data Reviewed Radiology: ordered.    HPI/ROS      See MDM section for pertinent HPI and ROS. A complete ROS was performed with pertinent positives/negatives noted above.   Past Medical History:  Diagnosis Date   Diverticulosis    Hyperlipidemia    Hypertension    Memory loss    Prediabetes    Vitamin D deficiency     Past Surgical History:  Procedure Laterality Date   NO PAST SURGERIES        Physical Exam   Vitals:   10/24/23 1537 10/24/23 1545  BP: (!) 140/74   Pulse: 61   Resp: 16   Temp: 98.6 F (37 C)   SpO2: 100%   Weight:  67.4 kg  Height:  5\' 11"  (1.803 m)  Physical Exam Gen: NAD. Appears comfortable HENT: Conjunctiva clear, PERRL, EOMI. MMM.  CV: RRR. No M/R/G Pulm: Lungs CTAB with no wheezing, rales, or rhonchi.  GI: Abdomen soft, non-tender, non-distended. Normal bowel sounds in all 4 quadrants. MSK/Skin: No lower extremity edema. Extremities warm, well-perfused with 2+ pulses in all 4 extremities. Neuro: A&Ox3. GCS 15. Moves all extremities.     Procedures   If procedures were preformed on this patient, they are listed below:   Procedures   Mikeal Hawthorne, MD Emergency Medicine PGY-2   Please note that this documentation was produced with the assistance of voice-to-text technology and may contain errors.

## 2023-10-25 NOTE — ED Provider Notes (Incomplete)
[place] EMERGENCY DEPARTMENT AT [facility] Provider Note  MDM   HPI/ROS:  Javier [name] is a 83 y.o. male with pertinent past medical history of CLL, pancytopenia, vascular dementia, GERD, diverticulosis, hypertension who presents for evaluation of dysphagia.  Patient's wife reports that he has been having some mild dysphagia for quite some time, however yesterday he told her that it feels as if he has a "knot" in the back of his throat when he tries to swallow or eat.  Since this time, she has been concerned over worsening dysphagia, stating that he will sometimes spit up mucus midway through a meal and appears to have decreased appetite.  Patient's wife and daughter feel that he has been mumbling more than normal recently, and has mildly hoarse voice.  They deny any neck swelling or other asymmetry.  Symptoms tend to be worse with solid foods, however cannot identify any other inciting or aggravating factors.  Of note, patient is a former tobacco user but quit about 20 years ago.  He previously smoked for roughly 30 years.  He has an appoint with his PCP on 1/27.  Per chart review, patient was previously evaluated for similar symptoms with EGD and barium swallow.  These tests did not show any acute pathology, though he was noted to have mild oropharyngeal delay.   Physical exam is notable for: - Frail, cachectic appearance -- Normal voice, tolerating secretions well, no evidence of respiratory distress -- No lymphadenopathy No neck swelling--  -- No lesions, masses, or other abnormalities in the posterior oropharynx or tongue  On my initial evaluation, patient is:  -Vital signs stable. Patient afebrile, hemodynamically stable, and non-toxic appearing. -Additional history obtained from patient's wife and daughter  Overall patient tolerating secretions well with normal voice and no signs of respiratory distress.  No clinical concern for airway compromise.  Differential diagnosis  includes oropharyngeal dysphagia  Differential diagnoses include   Interpretations, interventions, and the patient's course of care are documented below.      ***   Disposition:  {ED Dispo:29898}  Clinical Impression: No diagnosis found.  Rx / DC Orders ED Discharge Orders     None       The plan for this patient was discussed with Dr. ***, who voiced agreement and who oversaw evaluation and treatment of this patient.   Clinical Complexity A medically appropriate history, review of systems, and physical exam was performed.  My independent interpretations of EKG, labs, and radiology are documented in the ED course above.   If decision rules were used in this patient's evaluation, they are listed below.  *** Click here for ABCD2, HEART and other calculatorsREFRESH Note before signing   Patient's presentation is most consistent with {EM COPA:27473}  Medical Decision Making Amount and/or Complexity of Data Reviewed Radiology: ordered.    HPI/ROS      See MDM section for pertinent HPI and ROS. A complete ROS was performed with pertinent positives/negatives noted above.   Past Medical History:  Diagnosis Date  . Diverticulosis   . Hyperlipidemia   . Hypertension   . Memory loss   . Prediabetes   . Vitamin D deficiency     Past Surgical History:  Procedure Laterality Date  . NO PAST SURGERIES        Physical Exam   Vitals:   10/24/23 1537 10/24/23 1545  BP: (!) 140/74   Pulse: 61   Resp: 16   Temp: 98.6 F (37 C)   SpO2:  100%   Weight:  67.4 kg  Height:  5\' 11"  (1.803 m)    Physical Exam Gen: NAD. Appears comfortable HENT: Conjunctiva clear, PERRL, EOMI. MMM.  CV: RRR. No M/R/G Pulm: Lungs CTAB with no wheezing, rales, or rhonchi.  GI: Abdomen soft, non-tender, non-distended. Normal bowel sounds in all 4 quadrants. MSK/Skin: No lower extremity edema. Extremities warm, well-perfused with 2+ pulses in all 4 extremities. Neuro: A&Ox3. GCS 15.  Moves all extremities.     Procedures   If procedures were preformed on this patient, they are listed below:  Procedures   Mikeal Hawthorne, MD Emergency Medicine PGY-2   Please note that this documentation was produced with the assistance of voice-to-text technology and may contain errors.

## 2023-10-26 ENCOUNTER — Encounter: Payer: Self-pay | Admitting: Emergency Medicine

## 2023-10-26 ENCOUNTER — Ambulatory Visit (INDEPENDENT_AMBULATORY_CARE_PROVIDER_SITE_OTHER): Payer: Medicare HMO | Admitting: Emergency Medicine

## 2023-10-26 ENCOUNTER — Telehealth: Payer: Self-pay

## 2023-10-26 VITALS — BP 138/78 | HR 62 | Temp 98.2°F | Ht 71.0 in | Wt 156.0 lb

## 2023-10-26 DIAGNOSIS — R1319 Other dysphagia: Secondary | ICD-10-CM | POA: Insufficient documentation

## 2023-10-26 NOTE — Transitions of Care (Post Inpatient/ED Visit) (Signed)
   10/26/2023  Name: Javier [name] MRN: [id number] DOB: July 13, 1941  Today's TOC FU Call Status:    Attempted to reach the patient regarding the most recent Inpatient/ED visit.  Follow Up Plan: No further outreach attempts will be made at this time. We have been unable to contact the patient. Patient already seen in office Signature Karena Addison, LPN [facility] Nurse Health Advisor Direct Dial [phone]

## 2023-10-26 NOTE — Progress Notes (Signed)
Javier [name] 83 y.o.   Chief Complaint  Patient presents with   Dysphagia    Patient c/o of difficulty swallowing has gotten worse. Saturday he went to urgent care because he felt like something was burning in his throat. States having a lot of mucus while using nasal spray. Also mention his mouth doesn't move with his speech that much any more noticed this for about 2 weeks     HISTORY OF PRESENT ILLNESS: This is a 83 y.o. male here for follow-up of emergency department visit on 10/24/2023 when he presented with difficulty swallowing and pain to neck area Send CT scan soft tissue neck unremarkable.  Normal. Here accompanied by wife and caretaker He is able to eat and drink but has had multiple choking episodes in the past couple weeks No other complaints or medical concerns today.  HPI   Prior to Admission medications   Medication Sig Start Date End Date Taking? Authorizing Provider  aspirin EC 81 MG tablet Take 81 mg by mouth daily.   Yes [provider]  busPIRone (BUSPAR) 5 MG tablet TAKE 1 TABLET BY MOUTH THREE TIMES DAILY FOR  NERVOUSNESS  AND  CHRONIC  ANXIETY 10/05/23  Yes Aijalon Demuro, Eilleen Kempf, MD  Cholecalciferol (VITAMIN D3) 5000 units TABS Take 5,000 Units by mouth daily.   Yes [provider]  losartan (COZAAR) 100 MG tablet Take 1 tablet (100 mg total) by mouth daily. 09/23/22  Yes Cranford, Archie Patten, NP  QUEtiapine (SEROQUEL) 50 MG tablet Take 1 tablet (50 mg total) by mouth 2 (two) times daily. 07/14/23 07/08/24 Yes Lukus Binion, Eilleen Kempf, MD  rosuvastatin (CRESTOR) 20 MG tablet TAKE 1 TABLET EVERY DAY FOR CHOLESTEROL 07/14/22  Yes Cranford, Tonya, NP  senna-docusate (SENOKOT-S) 8.6-50 MG tablet Take 1 tablet by mouth at bedtime as needed for mild constipation. 04/30/22  Yes Long, Arlyss Repress, MD  sertraline (ZOLOFT) 50 MG tablet Take  1 tablet  Daily  for Mood  / Chronic Anxiety 10/23/22  Yes Zebulin Siegel, Eilleen Kempf, MD  fluticasone [facility]) 50 MCG/ACT nasal  spray Place 2 sprays into both nostrils daily. 12/18/21 10/24/23  Raynelle Dick, NP  hydrocortisone (ANUSOL-HC) 2.5 % rectal cream Place 1 Application rectally 2 (two) times daily. Patient not taking: Reported on 07/14/2023 04/30/22   Long, Arlyss Repress, MD  lipase/protease/amylase (CREON) 12000-38000 units CPEP capsule Take 12,000 Units by mouth 3 (three) times daily with meals. Patient not taking: Reported on 07/14/2023    [provider]  ondansetron (ZOFRAN) 4 MG tablet Take 1 tablet (4 mg total) by mouth every 6 (six) hours. Patient not taking: Reported on 10/26/2023 03/24/23   Netta Corrigan, PA-C  tamsulosin (FLOMAX) 0.4 MG CAPS capsule Take 1 capsule (0.4 mg total) by mouth daily after supper. For prostate. 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    Patient Active Problem List   Diagnosis Date Noted   Neoplasm of uncertain behavior of lymphoid, hematopoietic and related tissue, unspecified (HCC) 02/17/2023   Pulmonary emphysema, unspecified emphysema type (HCC) 02/17/2023   Memory loss 10/29/2021   Pancreatic insufficiency 08/20/2021   Left-sided weakness 08/10/2021   Generalized weakness 08/09/2021   Personal history of CLL (chronic lymphocytic leukemia) 08/09/2021  Pancytopenia (HCC) 08/09/2021   At high risk for falls 04/29/2021   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) 03/28/2021   Sleeping difficulty 03/28/2021   History of lacunar cerebrovascular accident 01/09/2021   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   BPH with obstruction/lower urinary tract symptoms 01/09/2021   First degree AV block 01/09/2021   Gait abnormality 12/20/2020   BMI 26.0-26.9,adult 03/06/2020   Gastric AVM  03/06/2020   History of colon polyps 03/06/2020   Aortic atherosclerosis (HCC) - per CT 04/2016 08/23/2019   Memory changes 05/25/2019   Anxiety 02/08/2018   CLL (chronic lymphocytic leukemia) (HCC) 11/01/2016   GERD  02/09/2015   Dyslipidemia    Hypertension    Vitamin D deficiency    Diverticulosis     Past Medical History:  Diagnosis Date   Diverticulosis    Hyperlipidemia    Hypertension    Memory loss    Prediabetes    Vitamin D deficiency     Past Surgical History:  Procedure Laterality Date   NO PAST SURGERIES      Social History   Socioeconomic History   Marital status: Married    Spouse name: Not on file   Number of children: 5   Years of education: 9th grade   Highest education level: Not on file  Occupational History   Occupation: retired  Tobacco Use   Smoking status: Former    Current packs/day: 0.00    Types: Cigarettes    Quit date: 01/17/2003    Years since quitting: 20.7   Smokeless tobacco: Never  Vaping Use   Vaping status: Never Used  Substance and Sexual Activity   Alcohol use: Not Currently    Comment: very rare   Drug use: No   Sexual activity: Not Currently  Other Topics Concern   Not on file  Social History Narrative   Lives at home with his wife.   Right-handed.   At most, one cup caffeine per day.    Social Drivers of Corporate investment banker Strain: Low Risk  (05/05/2023)   Overall Financial Resource Strain (CARDIA)    Difficulty of Paying Living Expenses: Not hard at all  Food Insecurity: No Food Insecurity (05/05/2023)   Hunger Vital Sign    Worried About Running Out of Food in the Last Year: Never true    Ran Out of Food in the Last Year: Never true  Transportation Needs: No Transportation Needs (05/05/2023)   PRAPARE - Administrator, Civil Service (Medical): No    Lack of Transportation (Non-Medical): No  Physical Activity: Insufficiently Active (05/05/2023)   Exercise Vital Sign    Days of Exercise per  Week: 1 day    Minutes of Exercise per Session: 40 min  Stress: No Stress Concern Present (05/05/2023)   [employer] of Occupational Health - Occupational Stress Questionnaire    Feeling of Stress : Not at all  Social Connections: Socially Integrated (05/05/2023)   Social Connection and Isolation Panel [NHANES]    Frequency of Communication with Friends and Family: More than three times a week    Frequency of Social Gatherings with Friends and Family: More than three times a week    Attends Religious Services: More than 4 times per year    Active Member of [employer] or Organizations: Yes    Attends Banker Meetings: More than 4 times per year    Marital Status: Married  Catering manager Violence: Not At Risk (05/05/2023)  Humiliation, Afraid, Rape, and Kick questionnaire    Fear of Current or Ex-Partner: No    Emotionally Abused: No    Physically Abused: No    Sexually Abused: No    Family History  Problem Relation Age of Onset   Stroke Mother    Early death Father        Farm/tractor accident   Diabetes Brother    Cirrhosis Brother    Colon cancer Paternal Grandfather      Review of Systems  Constitutional: Negative.  Negative for chills and fever.  HENT: Negative.  Negative for congestion and sore throat.   Respiratory: Negative.  Negative for cough and shortness of breath.   Cardiovascular: Negative.  Negative for chest pain and palpitations.  Gastrointestinal:  Negative for abdominal pain, nausea and vomiting.  Genitourinary: Negative.  Negative for dysuria and hematuria.  Skin: Negative.  Negative for rash.  Neurological: Negative.  Negative for dizziness and headaches.  All other systems reviewed and are negative.   Vitals:   10/26/23 1531  BP: 138/78  Pulse: 62  Temp: 98.2 F (36.8 C)  SpO2: 97%    Physical Exam Vitals reviewed.  Constitutional:      Appearance: Normal appearance.  HENT:     Head: Normocephalic.     Mouth/Throat:     Mouth:  Mucous membranes are moist.     Pharynx: Oropharynx is clear.  Eyes:     Extraocular Movements: Extraocular movements intact.  Cardiovascular:     Rate and Rhythm: Normal rate and regular rhythm.     Pulses: Normal pulses.     Heart sounds: Normal heart sounds.  Pulmonary:     Effort: Pulmonary effort is normal.     Breath sounds: Normal breath sounds.  Abdominal:     Palpations: Abdomen is soft.     Tenderness: There is no abdominal tenderness.  Musculoskeletal:     Cervical back: No tenderness.  Lymphadenopathy:     Cervical: No cervical adenopathy.  Skin:    General: Skin is warm and dry.  Neurological:     Mental Status: He is alert and oriented to person, place, and time.  Psychiatric:        Mood and Affect: Mood normal.        Behavior: Behavior normal.      ASSESSMENT & PLAN: A total of 32 minutes was spent with the patient and counseling/coordination of care regarding preparing for this visit, review of most recent office visit notes, review of most recent emergency department visit notes, review of most recent CT scan of neck report, review of multiple chronic medical conditions and their management, review of all medications, review of most recent bloodwork results, review of health maintenance items, education on nutrition, prognosis, documentation, and need for follow up.  Problem List Items Addressed This Visit       Digestive   Esophageal dysphagia - Primary   Active and affecting quality of life Multiple choking episodes in the last couple of weeks Recommend upper endoscopy Referral placed today. Diet and nutrition discussed.  Recommend soft diet and avoid hard to swallow items.      Relevant Orders   Ambulatory referral to Gastroenterology   Patient Instructions  Dysphagia Eating Plan, Bite Size Food This diet is recommended for people who are not able to bite pieces of food but are able to chew. You may need this diet if you have weakness of the  muscles that control swallowing, you have  an increased risk of choking, or you suffer from fatigue when chewing. Foods in the diet are soft, tender, and moist. Work with your health care provider, your diet and nutrition specialist (dietitian), or speech-language pathologist to make sure you are following the eating plan safely and getting all the nutrients you need. What are tips for following this plan? Cooking To moisten foods, add liquids while you are blending, mashing, or grinding your foods to the right consistency. These liquids include gravies, sauces, vegetable or fruit juice, milk, half and half, or water. Strain extra liquid from foods before eating. Reheat foods slowly to prevent a tough crust from forming. Prepare foods in advance. Meal planning Eat a variety of foods to get all the nutrients you need. Some foods may be tolerated better than others. Work with your Public relations account executive to identify which foods are safest for you to eat. Follow your meal plan as told by your dietitian. General information You may eat foods that are tender, soft, and moist. Always test food texture before taking a bite. Poke food with a fork or spoon to make sure it is tender. The test sample should squash, break apart, or change shape, and it should not return to its original shape when the fork or spoon is removed. Food should be easy to cut and chew. Avoid large pieces of food that require a lot of chewing. Take small bites. Each bite should be smaller than your thumbnail (about 15 mm by 15 mm for adults and 8mm by 8 mm for children). If you were on a pureed or minced food eating plan, you may eat any of the foods included in those diets. Avoid foods that are very dry, hard, sticky, chewy, coarse, or crunchy. If instructed by your health care provider, thicken liquids. Follow your health care provider's instructions for what products to use, how to do this, and to what thickness. What foods  should I eat?        Fruits Canned or cooked fruits that are soft or moist and do not have skin or seeds. Fresh, soft bananas. Vegetables Soft, well-cooked vegetables in small pieces. Soft-cooked, mashed potatoes. Grains Moist breads without nuts or seeds. Biscuits, muffins, pancakes, and waffles that are well-moistened with syrup, jelly, margarine, or butter. Cooked cereals. Moist bread stuffing. Moist rice. Well-moistened cold cereal with small chunks. Well-cooked pasta, noodles, and rice in small pieces and thick sauce. Soft dumplings or spaetzle in small pieces and butter or gravy. Meats and other proteins Tender, moist meats or poultry in small pieces. Moist meatballs or meatloaf. Fish without bones. Eggs or egg substitutes in small pieces. Tofu. Tempeh and meat alternatives in small pieces. Well-cooked, tender beans, peas, baked beans, and other legumes. Dairy Milk. Cream cheese. Yogurt. Cottage cheese. Sour cream. Small pieces of soft cheese. Fats and oils Butter. Oils. Margarine. Mayonnaise. Gravy. Spreads. Sweets and desserts Soft, smooth, moist desserts. Pudding. Custard. Moist cakes. Jam. Jelly. Honey. Preserves. Ask your health care provider whether you can have frozen desserts. Seasonings and other foods All seasonings and sweeteners. All sauces with small chunks. Prepared tuna, egg, or chicken salad without raw fruits or vegetables. Moist casseroles with small, tender pieces of meat. Soups with tender meat. The items listed above may not be a complete list of foods and beverages you can eat. Contact a dietitian for more information. What foods should I avoid? Fruits Hard, crunchy, stringy, high-pulp, and juicy raw fruits such as apples, pineapple, papaya, and watermelon. Small, round  fruits, such as grapes. Dried fruit and fruit leather. Vegetables All raw vegetables. Cooked corn. Rubbery or stiff cooked vegetables. Stringy vegetables, such as celery. Tough, crisp fried  potatoes. Potato skins. Grains Coarse or dry cereals. Dry breads. Toast. Crackers. Tough, crusty breads, such as [place] bread and baguettes. Dry pancakes, waffles, and muffins. Sticky rice. Dry bread stuffing. Granola. Popcorn. Chips. Meats and other proteins Large pieces of meat. Dry, tough meats, such as bacon, sausage, and hot dogs. Chicken, [place], or fish with skin and bones. Crunchy peanut butter. Nuts. Seeds. Nut and seed butters. Dairy Yogurt with nuts, seeds, or large chunks. Large chunks of cheese. Sweets and desserts Dry cakes. Chewy or dry cookies. Any desserts with nuts, seeds, dry fruits, coconut, pineapple, or anything dry, sticky, or hard. Chewy caramel. Licorice. Taffy-type candies. Ask your health care provider whether you can have frozen desserts. Seasonings and other foods Soups with tough or large chunks of meats, poultry, or vegetables. Corn or clam chowder. Smoothies with large chunks of fruit. The items listed above may not be a complete list of foods and beverages you should avoid. Contact a dietitian for more information. Summary Bite-size foods can be helpful for people with swallowing problems. On this dysphagia eating plan, you may eat foods that are soft, moist, and cut into pieces smaller than your thumbnail (about 15 mm by 15 mm for adults and 8mm by 8 mm for children). You may be instructed to thicken liquids. Follow your health care provider's instructions about how to do this and to what consistency. This information is not intended to replace advice given to you by your health care provider. Make sure you discuss any questions you have with your health care provider. Document Revised: 11/07/2021 Document Reviewed: 11/07/2021 Elsevier Patient Education  2024 Elsevier Inc.     Edwina Barth, MD [facility] at [facility]

## 2023-10-26 NOTE — Assessment & Plan Note (Addendum)
Active and affecting quality of life Multiple choking episodes in the last couple of weeks Recommend upper endoscopy Referral placed today. Diet and nutrition discussed.  Recommend soft diet and avoid hard to swallow items.

## 2023-10-26 NOTE — Patient Instructions (Signed)
Dysphagia Eating Plan, Bite Size Food This diet is recommended for people who are not able to bite pieces of food but are able to chew. You may need this diet if you have weakness of the muscles that control swallowing, you have an increased risk of choking, or you suffer from fatigue when chewing. Foods in the diet are soft, tender, and moist. Work with your health care provider, your diet and nutrition specialist (dietitian), or speech-language pathologist to make sure you are following the eating plan safely and getting all the nutrients you need. What are tips for following this plan? Cooking To moisten foods, add liquids while you are blending, mashing, or grinding your foods to the right consistency. These liquids include gravies, sauces, vegetable or fruit juice, milk, half and half, or water. Strain extra liquid from foods before eating. Reheat foods slowly to prevent a tough crust from forming. Prepare foods in advance. Meal planning Eat a variety of foods to get all the nutrients you need. Some foods may be tolerated better than others. Work with your Public relations account executive to identify which foods are safest for you to eat. Follow your meal plan as told by your dietitian. General information You may eat foods that are tender, soft, and moist. Always test food texture before taking a bite. Poke food with a fork or spoon to make sure it is tender. The test sample should squash, break apart, or change shape, and it should not return to its original shape when the fork or spoon is removed. Food should be easy to cut and chew. Avoid large pieces of food that require a lot of chewing. Take small bites. Each bite should be smaller than your thumbnail (about 15 mm by 15 mm for adults and 8mm by 8 mm for children). If you were on a pureed or minced food eating plan, you may eat any of the foods included in those diets. Avoid foods that are very dry, hard, sticky, chewy, coarse, or  crunchy. If instructed by your health care provider, thicken liquids. Follow your health care provider's instructions for what products to use, how to do this, and to what thickness. What foods should I eat?        Fruits Canned or cooked fruits that are soft or moist and do not have skin or seeds. Fresh, soft bananas. Vegetables Soft, well-cooked vegetables in small pieces. Soft-cooked, mashed potatoes. Grains Moist breads without nuts or seeds. Biscuits, muffins, pancakes, and waffles that are well-moistened with syrup, jelly, margarine, or butter. Cooked cereals. Moist bread stuffing. Moist rice. Well-moistened cold cereal with small chunks. Well-cooked pasta, noodles, and rice in small pieces and thick sauce. Soft dumplings or spaetzle in small pieces and butter or gravy. Meats and other proteins Tender, moist meats or poultry in small pieces. Moist meatballs or meatloaf. Fish without bones. Eggs or egg substitutes in small pieces. Tofu. Tempeh and meat alternatives in small pieces. Well-cooked, tender beans, peas, baked beans, and other legumes. Dairy Milk. Cream cheese. Yogurt. Cottage cheese. Sour cream. Small pieces of soft cheese. Fats and oils Butter. Oils. Margarine. Mayonnaise. Gravy. Spreads. Sweets and desserts Soft, smooth, moist desserts. Pudding. Custard. Moist cakes. Jam. Jelly. Honey. Preserves. Ask your health care provider whether you can have frozen desserts. Seasonings and other foods All seasonings and sweeteners. All sauces with small chunks. Prepared tuna, egg, or chicken salad without raw fruits or vegetables. Moist casseroles with small, tender pieces of meat. Soups with tender meat. The items listed  above may not be a complete list of foods and beverages you can eat. Contact a dietitian for more information. What foods should I avoid? Fruits Hard, crunchy, stringy, high-pulp, and juicy raw fruits such as apples, pineapple, papaya, and watermelon. Small, round  fruits, such as grapes. Dried fruit and fruit leather. Vegetables All raw vegetables. Cooked corn. Rubbery or stiff cooked vegetables. Stringy vegetables, such as celery. Tough, crisp fried potatoes. Potato skins. Grains Coarse or dry cereals. Dry breads. Toast. Crackers. Tough, crusty breads, such as [place] bread and baguettes. Dry pancakes, waffles, and muffins. Sticky rice. Dry bread stuffing. Granola. Popcorn. Chips. Meats and other proteins Large pieces of meat. Dry, tough meats, such as bacon, sausage, and hot dogs. Chicken, [place], or fish with skin and bones. Crunchy peanut butter. Nuts. Seeds. Nut and seed butters. Dairy Yogurt with nuts, seeds, or large chunks. Large chunks of cheese. Sweets and desserts Dry cakes. Chewy or dry cookies. Any desserts with nuts, seeds, dry fruits, coconut, pineapple, or anything dry, sticky, or hard. Chewy caramel. Licorice. Taffy-type candies. Ask your health care provider whether you can have frozen desserts. Seasonings and other foods Soups with tough or large chunks of meats, poultry, or vegetables. Corn or clam chowder. Smoothies with large chunks of fruit. The items listed above may not be a complete list of foods and beverages you should avoid. Contact a dietitian for more information. Summary Bite-size foods can be helpful for people with swallowing problems. On this dysphagia eating plan, you may eat foods that are soft, moist, and cut into pieces smaller than your thumbnail (about 15 mm by 15 mm for adults and 8mm by 8 mm for children). You may be instructed to thicken liquids. Follow your health care provider's instructions about how to do this and to what consistency. This information is not intended to replace advice given to you by your health care provider. Make sure you discuss any questions you have with your health care provider. Document Revised: 11/07/2021 Document Reviewed: 11/07/2021 Elsevier Patient Education  2024 [employer].

## 2023-10-28 NOTE — ED Provider Notes (Signed)
[facility] CARE CENTER   [id number] 10/24/23 Arrival Time: 1402  ASSESSMENT & PLAN:  1. Dysphagia, unspecified type     No signs of peritonsillar abscess. Family concerned over progressive difficulty with swallowing, now with liquids at times. To ED for further evaluation. By POV; stable upon discharge.  Meds ordered this encounter  Medications   lidocaine (XYLOCAINE) 2 % viscous mouth solution 15 mL   alum & mag hydroxide-simeth (MAALOX/MYLANTA) 200-200-20 MG/5ML suspension 30 mL  Mild help.   Reviewed expectations re: course of current medical issues. Questions answered. Outlined signs and symptoms indicating need for more acute intervention. Patient verbalized understanding. After Visit Summary given.   SUBJECTIVE: History from mother and daughter. Javier [name] is a 83 y.o. male who has swallowing difficulties per family. Since Thursday he has been c/o "a knot" in his throat and fells "like there might be something in there". C/o burning when he swallows. Wife also reports at times what he eats "comes back up".    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    Past Medical History:  Diagnosis Date   Diverticulosis    Hyperlipidemia    Hypertension    Memory loss    Prediabetes    Vitamin D deficiency    Social History   Socioeconomic History   Marital  status: Married    Spouse name: Not on file   Number of children: 5   Years of education: 9th grade   Highest education level: Not on file  Occupational History   Occupation: retired  Tobacco Use   Smoking status: Former    Current packs/day: 0.00    Types: Cigarettes    Quit date: 01/17/2003    Years since quitting: 20.7   Smokeless tobacco: Never  Vaping Use   Vaping status: Never Used  Substance and Sexual Activity   Alcohol use: Not Currently    Comment: very rare   Drug use: No   Sexual activity: Not Currently  Other Topics Concern   Not on file  Social History Narrative   Lives at home with his wife.   Right-handed.   At most, one cup caffeine per day.    Social Drivers of Corporate investment banker Strain: Low Risk  (05/05/2023)   Overall Financial Resource Strain (CARDIA)    Difficulty of Paying Living Expenses: Not hard at all  Food Insecurity: No Food Insecurity (05/05/2023)   Hunger Vital Sign    Worried About Running Out of Food in the Last Year: Never true    Ran Out of Food in the Last Year: Never true  Transportation Needs: No Transportation Needs (05/05/2023)   PRAPARE - Administrator, Civil Service (Medical): No    Lack of Transportation (Non-Medical): No  Physical Activity: Insufficiently Active (05/05/2023)   Exercise Vital Sign    Days of Exercise  per Week: 1 day    Minutes of Exercise per Session: 40 min  Stress: No Stress Concern Present (05/05/2023)   [employer] of Occupational Health - Occupational Stress Questionnaire    Feeling of Stress : Not at all  Social Connections: Socially Integrated (05/05/2023)   Social Connection and Isolation Panel [NHANES]    Frequency of Communication with Friends and Family: More than three times a week    Frequency of Social Gatherings with Friends and Family: More than three times a week    Attends Religious Services: More than 4 times per year    Active Member of [employer] or Organizations: Yes     Attends Banker Meetings: More than 4 times per year    Marital Status: Married  Catering manager Violence: Not At Risk (05/05/2023)   Humiliation, Afraid, Rape, and Kick questionnaire    Fear of Current or Ex-Partner: No    Emotionally Abused: No    Physically Abused: No    Sexually Abused: No   Family History  Problem Relation Age of Onset   Stroke Mother    Early death Father        Farm/tractor accident   Diabetes Brother    Cirrhosis Brother    Colon cancer Paternal Glynda Jaeger, MD 10/28/23 1207

## 2023-11-16 ENCOUNTER — Other Ambulatory Visit: Payer: Self-pay | Admitting: Emergency Medicine

## 2023-11-16 DIAGNOSIS — F419 Anxiety disorder, unspecified: Secondary | ICD-10-CM

## 2023-12-03 ENCOUNTER — Encounter: Payer: Self-pay | Admitting: *Deleted

## 2023-12-04 ENCOUNTER — Telehealth: Payer: Self-pay | Admitting: Emergency Medicine

## 2023-12-04 NOTE — Telephone Encounter (Signed)
 Patient was denied by [facility] due to him being discharged. I rounted referral to Javier [name]   Copied from CRM [id number]. Topic: Referral - Status >> Nov 03, 2023  3:26 PM Javier [name] wrote: Reason for CRM: Patient spouse called regarding referral status. Stated she would like for [place] to put a rush on this referral so he can be seen. I advised someone will call regarding scheduling.

## 2023-12-07 NOTE — Telephone Encounter (Unsigned)
 Copied from CRM [id number]. Topic: Referral - Question >> Dec 07, 2023  3:10 PM Gurney Maxin H wrote: Reason for CRM: Doyne Keel is calling regarding referral they received for patient, states patient has a history with [facility], if patient wants to be seen at [facility] patient has to request his records and have them sent to [facility] and the doctor has to approve the transfer of care. Doyne Keel states patient was referred to them previously and refused making an appointment with them at that time.  Doyne Keel [phone]

## 2023-12-10 NOTE — Telephone Encounter (Signed)
 Tried returning patients call. Unable to leave voice mail, mail box was full

## 2023-12-22 ENCOUNTER — Ambulatory Visit: Payer: Self-pay

## 2023-12-22 NOTE — Patient Outreach (Signed)
 Care Coordination   Follow Up Visit Note   12/22/2023 Name: Javier [name] MRN: [id number] DOB: 10/02/1940  Javier [name] is a 83 y.o. year old male who sees Sagardia, Javier Kempf, MD for primary care. I spoke with [name] by phone today.  What matters to the patients health and wellness today? Mrs. Coate reports patient is eating well at this time.  Mrs. Bublitz reports [facility] and [facility] aids and attendance for personal care through the [place]. She states she is awaiting a call back now regarding a light wheelchair. And reports the [place] has authorized home health Physical therapy for weakness/fall prevention and strengthening. She states she is able to get away for some time to herself when patient in home aid is there. Mrs. Bottger reports she considered [facility], but states patient does not She is without any specific questions at this time. Mrs. Diantonio to contact RNCM if care management needs in the future. RNCM confirmed she has RNCM's contact number.   Goals Addressed             This Visit's Progress    COMPLETED: Health Management        Interventions Today    Flowsheet Row Most Recent Value  Chronic Disease   Chronic disease during today's visit Other  [dementia,]  General Interventions   General Interventions Discussed/Reviewed General Interventions Reviewed, Doctor Visits  [Evaluation of current treatment plan for health condition and patient's adherence to plan. reiterated  and discussed signs/symptoms of dementia that patient displays.]  Doctor Visits Discussed/Reviewed PCP, Specialist  PCP/Specialist Visits Compliance with follow-up visit  [reviewed upcoming follow up appointments]  Exercise Interventions   Exercise Discussed/Reviewed Exercise Discussed  [confirmed patient is active with home health for physical therapy]  Education Interventions   Education Provided Provided Education  Provided Verbal Education On Medication, When  to see the doctor, Nutrition  [advised to continue to eat healthy, cut up food into small bits, provide nutritional supplements, take medications as prescribed, attend provider visits as scheduled]  Nutrition Interventions   Nutrition Discussed/Reviewed Nutrition Discussed  Pharmacy Interventions   Pharmacy Dicussed/Reviewed Pharmacy Topics Reviewed  [medications reviewed]  Safety Interventions   Safety Discussed/Reviewed Safety Reviewed, Fall Risk  [discussed falls and prevention and discussed how PT can  help with building muscle strength/tone.]            SDOH assessments and interventions completed:  Yes  SDOH Interventions Today    Flowsheet Row Most Recent Value  SDOH Interventions   Food Insecurity Interventions Intervention Not Indicated  Housing Interventions Intervention Not Indicated  Transportation Interventions Intervention Not Indicated  Utilities Interventions Intervention Not Indicated     Care Coordination Interventions:  Yes, provided   Follow up plan: No further intervention required.   Encounter Outcome:  Patient Visit Completed   Javier Sheriff, RN, MSN, BSN, CCM [place]  [facility], Population Health Case Manager Phone: [phone]

## 2023-12-22 NOTE — Patient Instructions (Signed)
 Visit Information  Thank you for taking time to visit with me today. Please don't hesitate to contact me if I can be of assistance to you.   Following are the goals we discussed today:  Continue to take medications as prescribed. Continue to attend provider visits as scheduled Continue to eat healthy, lean meats, vegetables, fruits, avoid saturated and transfats Contact provider with health questions or concerns as needed Work with home health therapist to increase strength/tone and minimize fall risk   If you are experiencing a Mental Health or Behavioral Health Crisis or need someone to talk to, please call the Suicide and Crisis Lifeline: 988 call the [place] National Suicide Prevention Lifeline: 307-538-6810 or TTY: 734-623-5985 TTY 612-558-5721) to talk to a trained counselor   Kathyrn Sheriff, RN, MSN, BSN, CCM [place]  [facility], Population Health Case Manager Phone: [phone]   Fall Prevention in the Home Falls can cause injuries and affect people of all ages. There are many simple things that you can do to make your home safe and to help prevent falls. If you need it, ask for help making these changes. What actions can I take to prevent falls? General information Use good lighting in all rooms. Make sure to: Replace any light bulbs that burn out. Turn on lights if it is dark and use night-lights. Keep items that you use often in easy-to-reach places. Lower the shelves around your home if needed. Move furniture so that there are clear paths around it. Do not keep throw rugs or other things on the floor that can make you trip. If any of your floors are uneven, fix them. Add color or contrast paint or tape to clearly mark and help you see: Grab bars or handrails. First and last steps of staircases. Where the edge of each step is. If you use a ladder or stepladder: Make sure that it is fully opened. Do not climb a closed ladder. Make sure the sides  of the ladder are locked in place. Have someone hold the ladder while you use it. Know where your pets are as you move through your home. What can I do in the bathroom?     Keep the floor dry. Clean up any water that is on the floor right away. Remove soap buildup in the bathtub or shower. Buildup makes bathtubs and showers slippery. Use non-skid mats or decals on the floor of the bathtub or shower. Attach bath mats securely with double-sided, non-slip rug tape. If you need to sit down while you are in the shower, use a non-slip stool. Install grab bars by the toilet and in the bathtub and shower. Do not use towel bars as grab bars. What can I do in the bedroom? Make sure that you have a light by your bed that is easy to reach. Do not use any sheets or blankets on your bed that hang to the floor. Have a firm bench or chair with side arms that you can use for support when you get dressed. What can I do in the kitchen? Clean up any spills right away. If you need to reach something above you, use a sturdy step stool that has a grab bar. Keep electrical cables out of the way. Do not use floor polish or wax that makes floors slippery. What can I do with my stairs? Do not leave anything on the stairs. Make sure that you have a light switch at the top and the bottom of the stairs.  Have them installed if you do not have them. Make sure that there are handrails on both sides of the stairs. Fix handrails that are broken or loose. Make sure that handrails are as long as the staircases. Install non-slip stair treads on all stairs in your home if they do not have carpet. Avoid having throw rugs at the top or bottom of stairs, or secure the rugs with carpet tape to prevent them from moving. Choose a carpet design that does not hide the edge of steps on the stairs. Make sure that carpet is firmly attached to the stairs. Fix any carpet that is loose or worn. What can I do on the outside of my home? Use  bright outdoor lighting. Repair the edges of walkways and driveways and fix any cracks. Clear paths of anything that can make you trip, such as tools or rocks. Add color or contrast paint or tape to clearly mark and help you see high doorway thresholds. Trim any bushes or trees on the main path into your home. Check that handrails are securely fastened and in good repair. Both sides of all steps should have handrails. Install guardrails along the edges of any raised decks or porches. Have leaves, snow, and ice cleared regularly. Use sand, salt, or ice melt on walkways during winter months if you live where there is ice and snow. In the garage, clean up any spills right away, including grease or oil spills. What other actions can I take? Review your medicines with your health care provider. Some medicines can make you confused or feel dizzy. This can increase your chance of falling. Wear closed-toe shoes that fit well and support your feet. Wear shoes that have rubber soles and low heels. Use a cane, walker, scooter, or crutches that help you move around if needed. Talk with your provider about other ways that you can decrease your risk of falls. This may include seeing a physical therapist to learn to do exercises to improve movement and strength. Where to find more information Centers for Disease Control and Prevention, STEADI: TonerPromos.no [employer] on Aging: BaseRingTones.pl National Institute on Aging: BaseRingTones.pl Contact a health care provider if: You are afraid of falling at home. You feel weak, drowsy, or dizzy at home. You fall at home. Get help right away if you: Lose consciousness or have trouble moving after a fall. Have a fall that causes a head injury. These symptoms may be an emergency. Get help right away. Call 911. Do not wait to see if the symptoms will go away. Do not drive yourself to the hospital. This information is not intended to replace advice given to you by your  health care provider. Make sure you discuss any questions you have with your health care provider. Document Revised: 05/19/2022 Document Reviewed: 05/19/2022 Elsevier Patient Education  2024 [employer].

## 2024-02-11 NOTE — Patient Outreach (Signed)
 Complex Care Management   Visit Note  02/11/2024  Name:  Javier [name] MRN: [id number] DOB: Jul 15, 1941  Situation: RNCM received incoming call from patient's spouse, Penelope Raza. She states she thought she was calling patient's case manager at the [place]. She reports concerns with patient getting up during the night, sweating during the night. She reports patient has medication, but she does not want to give it to him because it makes him sleep to much during the day. RNCM recommended spouse contact Dr. Vedia Geralds, and offered to sign patient up into the [facility] care management program. Mrs. Boeke declines at this time. She states preference to stay with one provider and will contact the [place] in [place]. She states patient has an appointment at the [facility] next week. Mrs. Bir reports, she will contact Dr. Vedia Geralds, if needed.   Recommendation:  RNCM provided contact number to the [facility] [phone]  Follow Up Plan: No follow up at this time. Mrs. Mcveigh to contact Dr. Sagardia if needed, Mrs. Gotcher to contact RNCM if needed.   Lindi Revering, RN, MSN, BSN, CCM [place]  [facility], Population Health Case Manager Phone: [phone]

## 2024-02-12 ENCOUNTER — Telehealth: Payer: Self-pay | Admitting: Emergency Medicine

## 2024-02-12 NOTE — Telephone Encounter (Signed)
 Copied from CRM [id number]. Topic: Referral - Question >> Feb 11, 2024  4:44 PM DeAngela L wrote: Reason for CRM: Patient wife is calling to ask the Dr if he could refer him to a neurologist, she says he is up all night and in and out of the house and needs help with his condition   Pt wife call back number [phone] (M)

## 2024-02-16 ENCOUNTER — Other Ambulatory Visit: Payer: Self-pay | Admitting: Radiology

## 2024-02-16 DIAGNOSIS — F01B18 Vascular dementia, moderate, with other behavioral disturbance: Secondary | ICD-10-CM

## 2024-02-16 NOTE — Telephone Encounter (Signed)
 Spoke with patients wife about referral to neurology being placed and maybe contacted sometime this week. Was informed if she thought of any other questions to give our office a call back. She understood

## 2024-02-16 NOTE — Telephone Encounter (Signed)
 Okay to refer to neurologist.

## 2024-02-18 ENCOUNTER — Ambulatory Visit (INDEPENDENT_AMBULATORY_CARE_PROVIDER_SITE_OTHER): Admitting: Emergency Medicine

## 2024-02-18 ENCOUNTER — Encounter: Payer: Self-pay | Admitting: Emergency Medicine

## 2024-02-18 VITALS — BP 130/78 | Temp 98.6°F | Ht 71.0 in

## 2024-02-18 DIAGNOSIS — N138 Other obstructive and reflux uropathy: Secondary | ICD-10-CM

## 2024-02-18 DIAGNOSIS — J439 Emphysema, unspecified: Secondary | ICD-10-CM | POA: Diagnosis not present

## 2024-02-18 DIAGNOSIS — Z856 Personal history of leukemia: Secondary | ICD-10-CM

## 2024-02-18 DIAGNOSIS — E785 Hyperlipidemia, unspecified: Secondary | ICD-10-CM | POA: Diagnosis not present

## 2024-02-18 DIAGNOSIS — S76212A Strain of adductor muscle, fascia and tendon of left thigh, initial encounter: Secondary | ICD-10-CM | POA: Diagnosis not present

## 2024-02-18 DIAGNOSIS — N401 Enlarged prostate with lower urinary tract symptoms: Secondary | ICD-10-CM

## 2024-02-18 DIAGNOSIS — S76219A Strain of adductor muscle, fascia and tendon of unspecified thigh, initial encounter: Secondary | ICD-10-CM | POA: Insufficient documentation

## 2024-02-18 DIAGNOSIS — F01B18 Vascular dementia, moderate, with other behavioral disturbance: Secondary | ICD-10-CM

## 2024-02-18 DIAGNOSIS — I10 Essential (primary) hypertension: Secondary | ICD-10-CM | POA: Diagnosis not present

## 2024-02-18 NOTE — Progress Notes (Signed)
 Javier [name] 83 y.o.   Chief Complaint  Patient presents with   Groin Pain    x2weeks    HISTORY OF PRESENT ILLNESS: This is a 83 y.o. male complaining of left-sided groin pain that started about 2 weeks ago Denies injury.  Accompanied by wife today. No falls.  No associated symptoms. No other complaints or medical concerns today.  Groin Pain Associated symptoms include coughing. Pertinent negatives include no abdominal pain, chest pain, chills, diarrhea, dysuria, fever, headaches, nausea, rash, shortness of breath, sore throat or vomiting.     Prior to Admission medications   Medication Sig Start Date End Date Taking? Authorizing Provider  aspirin  EC 81 MG tablet Take 81 mg by mouth daily.    [provider]  busPIRone  (BUSPAR ) 5 MG tablet TAKE 1 TABLET BY MOUTH THREE TIMES DAILY FOR  NERVOUSNESS  AND  CHRONIC  ANXIETY 10/05/23   Elvira Hammersmith, MD  Cholecalciferol  (VITAMIN D3) 5000 units TABS Take 5,000 Units by mouth daily.    [provider]  fluticasone  (FLONASE ) 50 MCG/ACT nasal spray Place 2 sprays into both nostrils daily. 12/18/21 10/24/23  Wilkinson, Dana E, FNP  hydrocortisone  (ANUSOL -HC) 2.5 % rectal cream Place 1 Application rectally 2 (two) times daily. Patient not taking: Reported on 12/22/2023 04/30/22   Roberts Ching, MD  lipase/protease/amylase (CREON ) 12000-38000 units CPEP capsule Take 12,000 Units by mouth 3 (three) times daily with meals. Patient not taking: Reported on 07/14/2023    [provider]  losartan  (COZAAR ) 100 MG tablet Take 1 tablet (100 mg total) by mouth daily. 09/23/22   Cranford, Tonya, NP  ondansetron  (ZOFRAN ) 4 MG tablet Take 1 tablet (4 mg total) by mouth every 6 (six) hours. Patient not taking: Reported on 10/24/2023 03/24/23   Denese Finn, PA-C  QUEtiapine  (SEROQUEL ) 50 MG tablet Take 1 tablet (50 mg total) by mouth 2 (two) times daily. 07/14/23 07/08/24  Elvira Hammersmith, MD  rosuvastatin  (CRESTOR )  20 MG tablet TAKE 1 TABLET EVERY DAY FOR CHOLESTEROL 07/14/22   Cranford, Tonya, NP  senna-docusate (SENOKOT-S) 8.6-50 MG tablet Take 1 tablet by mouth at bedtime as needed for mild constipation. 04/30/22   Long, Shereen Dike, MD  sertraline  (ZOLOFT ) 50 MG tablet TAKE 1 TABLET BY MOUTH ONCE DAILY FOR  MOOD/CHRONIC  ANXIETY 11/16/23   Elvira Hammersmith, MD  tamsulosin  (FLOMAX ) 0.4 MG CAPS capsule Take 1 capsule (0.4 mg total) by mouth daily after supper. For prostate. 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    Patient Active Problem List   Diagnosis Date Noted   Esophageal dysphagia 10/26/2023   Neoplasm of uncertain behavior of lymphoid, hematopoietic and related tissue, unspecified (HCC) 02/17/2023   Pulmonary emphysema, unspecified emphysema type (HCC) 02/17/2023   Memory loss 10/29/2021   Pancreatic insufficiency 08/20/2021   Left-sided weakness 08/10/2021   Generalized weakness 08/09/2021   Personal history of CLL (chronic lymphocytic leukemia) 08/09/2021   Pancytopenia (HCC) 08/09/2021   At high risk for falls 04/29/2021   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) 03/28/2021   Sleeping difficulty 03/28/2021   History of lacunar cerebrovascular accident 01/09/2021   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   BPH with obstruction/lower urinary  tract symptoms 01/09/2021   First degree AV block 01/09/2021   Gait abnormality 12/20/2020   BMI 26.0-26.9,adult 03/06/2020   Gastric AVM 03/06/2020   History of colon polyps 03/06/2020   Aortic atherosclerosis (HCC) - per CT 04/2016 08/23/2019   Memory changes 05/25/2019   Anxiety 02/08/2018   CLL (chronic lymphocytic leukemia) (HCC) 11/01/2016   GERD  02/09/2015    Dyslipidemia    Hypertension    Vitamin D  deficiency    Diverticulosis     Past Medical History:  Diagnosis Date   Diverticulosis    Hyperlipidemia    Hypertension    Memory loss    Prediabetes    Vitamin D  deficiency     Past Surgical History:  Procedure Laterality Date   NO PAST SURGERIES      Social History   Socioeconomic History   Marital status: Married    Spouse name: Not on file   Number of children: 5   Years of education: 9th grade   Highest education level: Not on file  Occupational History   Occupation: retired  Tobacco Use   Smoking status: Former    Current packs/day: 0.00    Types: Cigarettes    Quit date: 01/17/2003    Years since quitting: 21.1   Smokeless tobacco: Never  Vaping Use   Vaping status: Never Used  Substance and Sexual Activity   Alcohol use: Not Currently    Comment: very rare   Drug use: No   Sexual activity: Not Currently  Other Topics Concern   Not on file  Social History Narrative   Lives at home with his wife.   Right-handed.   At most, one cup caffeine per day.    Social Drivers of Corporate investment banker Strain: Low Risk  (05/05/2023)   Overall Financial Resource Strain (CARDIA)    Difficulty of Paying Living Expenses: Not hard at all  Food Insecurity: No Food Insecurity (12/22/2023)   Hunger Vital Sign    Worried About Running Out of Food in the Last Year: Never true    Ran Out of Food in the Last Year: Never true  Transportation Needs: No Transportation Needs (12/22/2023)   PRAPARE - Administrator, Civil Service (Medical): No    Lack of Transportation (Non-Medical): No  Physical Activity: Insufficiently Active (05/05/2023)   Exercise Vital Sign    Days of Exercise per Week: 1 day    Minutes of Exercise per Session: 40 min  Stress: No Stress Concern Present (05/05/2023)   [employer] of Occupational Health - Occupational Stress Questionnaire    Feeling of Stress : Not at all  Social  Connections: Socially Integrated (05/05/2023)   Social Connection and Isolation Panel [NHANES]    Frequency of Communication with Friends and Family: More than three times a week    Frequency of Social Gatherings with Friends and Family: More than three times a week    Attends Religious Services: More than 4 times per year    Active Member of [employer] or Organizations: Yes    Attends Banker Meetings: More than 4 times per year    Marital Status: Married  Catering manager Violence: Not At Risk (12/22/2023)   Humiliation, Afraid, Rape, and Kick questionnaire    [name] of Current or Ex-Partner: No    Emotionally Abused: No    Physically Abused: No    Sexually Abused: No    Family History  Problem Relation Age of Onset  Stroke Mother    Early death Father        Farm/tractor accident   Diabetes Brother    Cirrhosis Brother    Colon cancer Paternal Grandfather      Review of Systems  Constitutional: Negative.  Negative for chills and fever.  HENT: Negative.  Negative for congestion and sore throat.   Respiratory:  Positive for cough. Negative for shortness of breath.   Cardiovascular: Negative.  Negative for chest pain and palpitations.  Gastrointestinal:  Negative for abdominal pain, diarrhea, nausea and vomiting.  Genitourinary: Negative.  Negative for dysuria and hematuria.  Skin: Negative.  Negative for rash.  Neurological: Negative.  Negative for dizziness and headaches.  All other systems reviewed and are negative.   Today's Vitals   02/18/24 1316  BP: 130/78  Temp: 98.6 F (37 C)  Height: 5\' 11"  (1.803 m)   Body mass index is 21.76 kg/m.   Physical Exam Constitutional:      Appearance: Normal appearance.  HENT:     Head: Normocephalic.  Eyes:     Extraocular Movements: Extraocular movements intact.  Cardiovascular:     Rate and Rhythm: Normal rate.  Pulmonary:     Effort: Pulmonary effort is normal.  Abdominal:     Palpations: Abdomen is soft.      Tenderness: There is no abdominal tenderness.     Hernia: No hernia is present. There is no hernia in the left inguinal area or right inguinal area.  Genitourinary:    Penis: Uncircumcised.      Testes: Normal.     Epididymis:     Right: Normal.     Left: Normal.  Lymphadenopathy:     Lower Body: No right inguinal adenopathy. No left inguinal adenopathy.  Skin:    General: Skin is warm and dry.  Neurological:     Mental Status: He is alert.  Psychiatric:        Mood and Affect: Mood normal.        Behavior: Behavior normal.      ASSESSMENT & PLAN: A total of 32 minutes was spent with the patient and counseling/coordination of care regarding preparing for this visit, review of most recent office visit notes, review of multiple chronic medical conditions and their management, diagnosis of groin strain and management, pain management, review of all medications, review of most recent bloodwork results, review of health maintenance items, education on nutrition, prognosis, documentation, and need for follow up.   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 Pain management discussed No other concerns.        Genitourinary   BPH with obstruction/lower urinary tract symptoms   Stable. No urinary  symptoms. Continues Flomax  0.4  mg daily         Other   Dyslipidemia   Chronic stable condition Continue rosuvastatin  20 mg daily.      Personal history of CLL (chronic lymphocytic leukemia)   Stable. Sees hematologist on a regular basis         Patient Instructions  Adductor Muscle Strain  An adductor muscle strain, also called a groin strain or pull, is an injury to the muscles or tendons on the upper, inner part of the thigh. These muscles are called the adductor or groin muscles. They are responsible for moving the legs across the body or pulling the legs together. A muscle strain occurs when a muscle is overstretched and some muscle fibers are torn. The severity of an adductor muscle strain is rated as Grade 1, 2, or 3. A Grade 3 strain has the most tearing and pain. What are the causes? Adductor muscle strains usually occur during exercise or while participating in sports. This condition may be caused by: A sudden, violent force placed on the muscle, stretching it too far. Stretching the muscles too far or too suddenly, often during side-to-side motion with a sudden change in direction. Putting repeated stress on the adductor muscles over a long period of time. Performing vigorous activity without properly stretching or warming up the adductor muscles beforehand. Not being properly conditioned. What are the signs or symptoms? Symptoms of this condition include: Pain and tenderness in the groin area. This begins as sharp pain and persists as a dull ache. A popping or snapping feeling when the injury occurs (for severe strains). Swelling or bruising. Muscle spasms. Weakness in the leg. Stiffness in the groin area with decreased ability to move the affected muscles. How is this diagnosed? This condition may be diagnosed based on: A physical exam. Your medical history. How well you can do certain range of motion exercises. Imaging tests, such as MRI, ultrasound, or  X-rays. Your strain may be rated based on how severe it is. The ratings are: Grade 1 strain (mild). Muscles are overstretched. There may be very small muscle tears. This type of strain generally heals in about one week. Grade 2 strain (moderate). Muscles are partially torn. This may take one to two months to heal. Grade 3 strain (severe). Muscles are completely torn. A severe strain can take more than three months to heal. Grade 3 gluteal strains are rare. How is this treated? An adductor strain will often heal on its own. If needed, this condition may be treated with: PRICE therapy. PRICE stands for protection of the injured area, rest, ice, pressure (compression), and elevation. Medicines to help manage pain and swelling (anti-inflammatory medicines). Crutches. You may be directed to use these for the first few days to minimize your pain. Depending on the severity of the muscle strain, recovery time may vary from a few weeks to several months. Severe injuries often require 4-6 weeks for recovery. In those cases, complete healing can take 4-5 months. Follow these instructions at home: PRICE Therapy  Protect the muscle from being injured again. Rest. Do not use the strained muscle if it causes pain. If directed, put ice on the injured area: Put ice in a plastic bag. Place a towel between your skin and the bag. Leave the ice on for 20 minutes, 2-3 times a day. Do this for the first 2 days after the injury. Apply compression by wrapping the injured area with an elastic bandage as told by your health care provider. Raise (  elevate) the injured area above the level of your heart while you are sitting or lying down. Activity Do not drive or use heavy machinery while taking prescription pain medicine. Walk, stretch, and do exercises as told by your health care provider. Only do these activities if you can do so without any pain. General instructions Take over-the-counter and prescription medicines  only as told by your health care provider. Follow your treatment plan as told by your health care provider. This may include: Physical therapy. Massage. Local electrical stimulation (transcutaneous electrical nerve stimulation, TENS). Keep all follow-up visits. This is important. How is this prevented? Warm up and stretch before being active. Cool down and stretch after being active. Give your body time to rest between periods of activity. Make sure to use equipment that fits you. Be safe and responsible while being active to avoid slips and falls. Maintain physical fitness, including: Proper conditioning in the adductor muscles. Overall strength, flexibility, and endurance. Contact a health care provider if: You have increased pain or swelling in the affected area. Your symptoms are not improving or they are getting worse. Summary An adductor muscle strain, also called a groin strain or pull, is an injury to the muscles or tendons on the upper, inner part of the thigh. A muscle strain occurs when a muscle is overstretched and some muscle fibers are torn. Depending on the severity of the muscle strain, recovery time may vary from a few weeks to several months. This information is not intended to replace advice given to you by your health care provider. Make sure you discuss any questions you have with your health care provider. Document Revised: 01/19/2023 Document Reviewed: 02/13/2021 Elsevier Patient Education  2024 Elsevier Inc.    Maryagnes Small, MD [facility]

## 2024-02-18 NOTE — Assessment & Plan Note (Signed)
 BP Readings from Last 3 Encounters:  02/18/24 130/78  10/26/23 138/78  10/24/23 (!) 149/79  Well-controlled hypertension Continue losartan  100 mg daily

## 2024-02-18 NOTE — Patient Instructions (Signed)
Adductor Muscle Strain  An adductor muscle strain, also called a groin strain or pull, is an injury to the muscles or tendons on the upper, inner part of the thigh. These muscles are called the adductor or groin muscles. They are responsible for moving the legs across the body or pulling the legs together. A muscle strain occurs when a muscle is overstretched and some muscle fibers are torn. The severity of an adductor muscle strain is rated as Grade 1, 2, or 3. A Grade 3 strain has the most tearing and pain. What are the causes? Adductor muscle strains usually occur during exercise or while participating in sports. This condition may be caused by: A sudden, violent force placed on the muscle, stretching it too far. Stretching the muscles too far or too suddenly, often during side-to-side motion with a sudden change in direction. Putting repeated stress on the adductor muscles over a long period of time. Performing vigorous activity without properly stretching or warming up the adductor muscles beforehand. Not being properly conditioned. What are the signs or symptoms? Symptoms of this condition include: Pain and tenderness in the groin area. This begins as sharp pain and persists as a dull ache. A popping or snapping feeling when the injury occurs (for severe strains). Swelling or bruising. Muscle spasms. Weakness in the leg. Stiffness in the groin area with decreased ability to move the affected muscles. How is this diagnosed? This condition may be diagnosed based on: A physical exam. Your medical history. How well you can do certain range of motion exercises. Imaging tests, such as MRI, ultrasound, or X-rays. Your strain may be rated based on how severe it is. The ratings are: Grade 1 strain (mild). Muscles are overstretched. There may be very small muscle tears. This type of strain generally heals in about one week. Grade 2 strain (moderate). Muscles are partially torn. This may take  one to two months to heal. Grade 3 strain (severe). Muscles are completely torn. A severe strain can take more than three months to heal. Grade 3 gluteal strains are rare. How is this treated? An adductor strain will often heal on its own. If needed, this condition may be treated with: PRICE therapy. PRICE stands for protection of the injured area, rest, ice, pressure (compression), and elevation. Medicines to help manage pain and swelling (anti-inflammatory medicines). Crutches. You may be directed to use these for the first few days to minimize your pain. Depending on the severity of the muscle strain, recovery time may vary from a few weeks to several months. Severe injuries often require 4-6 weeks for recovery. In those cases, complete healing can take 4-5 months. Follow these instructions at home: PRICE Therapy  Protect the muscle from being injured again. Rest. Do not use the strained muscle if it causes pain. If directed, put ice on the injured area: Put ice in a plastic bag. Place a towel between your skin and the bag. Leave the ice on for 20 minutes, 2-3 times a day. Do this for the first 2 days after the injury. Apply compression by wrapping the injured area with an elastic bandage as told by your health care provider. Raise (elevate) the injured area above the level of your heart while you are sitting or lying down. Activity Do not drive or use heavy machinery while taking prescription pain medicine. Walk, stretch, and do exercises as told by your health care provider. Only do these activities if you can do so without any pain. General instructions  Take over-the-counter and prescription medicines only as told by your health care provider. Follow your treatment plan as told by your health care provider. This may include: Physical therapy. Massage. Local electrical stimulation (transcutaneous electrical nerve stimulation, TENS). Keep all follow-up visits. This is important. How  is this prevented? Warm up and stretch before being active. Cool down and stretch after being active. Give your body time to rest between periods of activity. Make sure to use equipment that fits you. Be safe and responsible while being active to avoid slips and falls. Maintain physical fitness, including: Proper conditioning in the adductor muscles. Overall strength, flexibility, and endurance. Contact a health care provider if: You have increased pain or swelling in the affected area. Your symptoms are not improving or they are getting worse. Summary An adductor muscle strain, also called a groin strain or pull, is an injury to the muscles or tendons on the upper, inner part of the thigh. A muscle strain occurs when a muscle is overstretched and some muscle fibers are torn. Depending on the severity of the muscle strain, recovery time may vary from a few weeks to several months. This information is not intended to replace advice given to you by your health care provider. Make sure you discuss any questions you have with your health care provider. Document Revised: 01/19/2023 Document Reviewed: 02/13/2021 Elsevier Patient Education  2024 [employer].

## 2024-02-18 NOTE — Assessment & Plan Note (Signed)
Stable.  Sees hematologist on a regular basis.

## 2024-02-18 NOTE — Assessment & Plan Note (Signed)
Chronic stable condition. Continue rosuvastatin 20 mg daily.  

## 2024-02-18 NOTE — Assessment & Plan Note (Signed)
Stable chronic condition 

## 2024-02-18 NOTE — Assessment & Plan Note (Signed)
 Chronic stable conditions Continue Seroquel  50 mg twice a day and Zoloft  50 mg daily

## 2024-02-18 NOTE — Assessment & Plan Note (Signed)
Stable.  No urinary symptoms.  Continues Flomax 0.4 mg daily

## 2024-02-18 NOTE — Assessment & Plan Note (Signed)
 No hernia findings on physical examination Mild tenderness along left inguinal canal No red flag signs or symptoms. Pain management discussed No other concerns.

## 2024-03-01 ENCOUNTER — Encounter: Payer: Self-pay | Admitting: Neurology

## 2024-03-01 ENCOUNTER — Ambulatory Visit: Admitting: Neurology

## 2024-03-01 VITALS — BP 127/73 | HR 60 | Resp 15 | Ht 71.0 in | Wt 151.0 lb

## 2024-03-01 DIAGNOSIS — F03C11 Unspecified dementia, severe, with agitation: Secondary | ICD-10-CM | POA: Diagnosis not present

## 2024-03-01 MED ORDER — DIVALPROEX SODIUM ER 500 MG PO TB24: 500.0000 mg | ORAL_TABLET | Freq: Every evening | ORAL | 11 refills | Status: DC

## 2024-03-01 NOTE — Progress Notes (Unsigned)
 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 male  Dementia with agitation  MoCA examination 17/30  Early morning /night time agitation, excessive sweat during night overall has much improved after adjustment of his medications,   On polypharmacy, decided to stop Zoloft  50 mg every morning, wife was not sure is helping him, continue Seroquel  50 mg 1 as needed during the day, 2 tablets at nighttime, add on Depakote ER 500 mg every night, he is also on BuSpar  5 mg 3 times a day  Return to clinic in 6 months   DIAGNOSTIC DATA (LABS, IMAGING, TESTING) - I reviewed patient records, labs, notes, testing and imaging myself where available. MRI of the brain on December 27 2020 1.   No acute findings. 2.   T2/flair hyperintense foci in the cerebral hemispheres consistent with chronic microvascular ischemic change and small foci in the cerebellar hemispheres consistent with small lacunar infarctions.  None of the ischemic changes appears to be acute. CT abdomen on Feb 22, 2021: No abnormality seen to explain the presenting symptoms. The patient does have mild diverticulosis of the left colon but there is no CT evidence of acute diverticulitis.  Laboratory evaluation in 2022,  CMP, creatinine 1.14, calcium  mildly elevated 10.5, glucose 109, CBC, Hg 13.9, LDH 156.   HISTORICAL  Javier [name], is a 83 year old male seen in request by her primary care physician Dr. Cassondra Cliff, Sammie Crigler for evaluation of memory loss, initial evaluation was on December 20, 2020.  He is accompanied by his wife at today's clinical visit.  I reviewed and summarized the referring note.PMHX. HTN Anxiety, zoloft  since 2020, seroquel  50mg  qhs, xanax  0.5mg    He is a retired Chartered certified accountant, denied family history of  memory loss, he was diagnosed with chronic lymphocytic leukemia, CAT scan 2018 showed small hypermetabolic node in left thorax, received chemotherapy, finished treatment since 2021, was under the care of oncologist Dr. Salomon Cree, most recent visit was on June 22, 2020 he was treated with venetoclax  plus obinutuzumab  from May to Sept 2020.  Since he finished chemotherapy, around the beginning of 2021, was noted to have early morning excessive sweat, anxiety, felt burning hot, he often has to get up from bed, go outside,  with no shoes on, after few minutes, then he felt cold, has to come in, but could not stay in for too long because of burning hot, sweaty, he has to go outside again, this usually happens 3-4 AM, he will often get in and out of house multiple times, and gradually settle down around 9 AM,  He was given Xanax , which is somewhat helpful, but only takes occasion, if he took excessive amount of Xanax , become very sleepy  He can fall to sleep okay after taking Seroquel  50 mg every night, wife also reported that he become very sedentary since treatment, he cannot get his mind off something if he begin to focus on that  UPDATE March 28 2021: He is accompanied by his wife at today's visit, reported increased anxiety, weakness, more difficulty sleeping, he gets up in the middle of the sleep every night after few hours of sleep, complains of feeling hot, has to go out to cool down, only when he felt so cold, he came back from  outside, then had to go out again with the same complaint,  He has stopped Zoloft , and Seroquel , "it is too much anxiety medications".  Instead he relies on Xanax  0.5 mg up to 4 tablets each day, prescription was from his primary care physician, wife stated that "he is hooked up to this medications, lives on it".   We personally reviewed MRI wo on December 28 2020,  No acute findings. microvascular ischemic change and small foci in the cerebellar hemispheres consistent with small  lacunar infarctions.  None of the ischemic changes appears to be acute.  Laboratory evaluations, CMP showed elevated creatinine 1.14, glucose of 109, calcium  mildly elevated 10.5, CBC, hemoglobin of 13.9  UPDATE Oct 29 2021: Is accompanied by his wife at today's visit, there were some medication changes recently, overall he is much better, less agitated, no longer has nighttime sweaty spells, he can sleep through the night with medications, but wife is very confused about the medications, and reported patient tends to sleepy during the day, especially early morning time, then some improvement in the afternoon  MoCA examination 17/30 today, Seroquel  50 mg 3 times a day was given by his primary care physician Dr. Vangie Genet on October 02, 2021, wife stated he is not taking it, Buspirone   instead of taking 5 mg 3 times a day, he only takes twice a day, Zoloft  50 mg every morning, wife also reported patient have generalized weakness, loss of stamina,  Laboratory evaluation November 2022, normal vitamin D , TSH, CMP, negative acetylcholine receptor antibody, hemoglobin of 12.3, A1c of 5.3  UPDATE March 01 2024: He lives at home with his wife, has caregiver comes in 4 hours each day for 5 days, he continues to have frequent episode of breakout into sweat, this will make him very anxious, pacing back-and-forth in the room, outside of his house, he has difficulty sleeping because of that, taking quetiapine  50 mg as needed during the day, 2 tablets at nighttime, even that, he can only sleep 3 to 4 hours, then woke up again, had to go out to the porch,  He can put on his clothes, feeding himself, no incontinence, REVIEW OF SYSTEMS: Full 14 system review of systems performed and notable only for as above All other review of systems were negative.  PHYSICAL EXAM   Vitals:   03/01/24 0951 03/01/24 1001  BP: (!) 154/67 127/73  Pulse: 60   Resp: 15   SpO2: 97%   Weight: 151 lb (68.5 kg)   Height: 5\' 11"   (1.803 m)     Body mass index is 21.06 kg/m.  PHYSICAL EXAMNIATION:  Gen: NAD, conversant, well nourised, well groomed       NEUROLOGICAL EXAM:  MENTAL STATUS: Cooperative on examination, rely on his wife to provide history,       03/01/2024    9:58 AM 12/20/2020    2:07 PM 05/25/2019    6:12 PM  MMSE - Mini Mental State Exam  Orientation to time 1 5 5   Orientation to Place 4 5 5   Registration 3 2 3   Attention/ Calculation 2 5 5   Recall 1 3 2   Language- name 2 objects 2 2 2   Language- repeat 0 1 1  Language- follow 3 step command 2 3 3   Language- read & follow direction 1 1 1   Write a sentence 0 1 1  Copy design 0 0 1  Total score 16 28 29     CRANIAL NERVES: CN II: Visual fields are  full to confrontation. Pupils are round equal and briskly reactive to light. CN III, IV, VI: extraocular movement are normal. No ptosis. CN V: Facial sensation is intact to light touch CN VII: Face is symmetric with normal eye closure  CN VIII: Hearing is normal to causal conversation. CN IX, X: Phonation is normal. CN XI: Head turning and shoulder shrug are intact  MOTOR: There is no pronator drift of out-stretched arms. Muscle bulk and tone are normal. Muscle strength is normal.  REFLEXES: Reflexes are 1 and symmetric at the biceps, triceps, knees, and ankles. Plantar responses are flexor.  SENSORY: Intact to light touch, pinprick and vibratory sensation are intact in fingers and toes.  COORDINATION: There is no trunk or limb dysmetria noted.  GAIT/STANCE: He needs push-up to get up from seated position, wide-based, cautious, mildly unsteady, rely on his walker  ALLERGIES: Allergies  Allergen Reactions   Penicillins Shortness Of Breath, Swelling and Other (See Comments)    Unknown allergic reaction as a teenager   Ppd [Tuberculin Purified Protein Derivative]     Positive test in 2004    HOME MEDICATIONS: Current Outpatient Medications  Medication Sig Dispense Refill    aspirin  EC 81 MG tablet Take 81 mg by mouth daily.     busPIRone  (BUSPAR ) 5 MG tablet TAKE 1 TABLET BY MOUTH THREE TIMES DAILY FOR  NERVOUSNESS  AND  CHRONIC  ANXIETY 270 tablet 0   Cholecalciferol  (VITAMIN D3) 5000 units TABS Take 5,000 Units by mouth daily.     hydrocortisone  (ANUSOL -HC) 2.5 % rectal cream Place 1 Application rectally 2 (two) times daily. 30 g 0   lipase/protease/amylase (CREON ) 12000-38000 units CPEP capsule Take 12,000 Units by mouth 3 (three) times daily with meals.     losartan  (COZAAR ) 100 MG tablet Take 1 tablet (100 mg total) by mouth daily. 90 tablet 0   QUEtiapine  (SEROQUEL ) 50 MG tablet Take 1 tablet (50 mg total) by mouth 2 (two) times daily. 180 tablet 3   rosuvastatin  (CRESTOR ) 20 MG tablet TAKE 1 TABLET EVERY DAY FOR CHOLESTEROL 90 tablet 10   sertraline  (ZOLOFT ) 50 MG tablet TAKE 1 TABLET BY MOUTH ONCE DAILY FOR  MOOD/CHRONIC  ANXIETY 90 tablet 0   No current facility-administered medications for this visit.    PAST MEDICAL HISTORY: Past Medical History:  Diagnosis Date   Diverticulosis    Hyperlipidemia    Hypertension    Memory loss    Prediabetes    Vitamin D  deficiency     PAST SURGICAL HISTORY: Past Surgical History:  Procedure Laterality Date   NO PAST SURGERIES      FAMILY HISTORY: Family History  Problem Relation Age of Onset   Stroke Mother    Early death Father        Farm/tractor accident   Diabetes Brother    Cirrhosis Brother    Colon cancer Paternal Grandfather     SOCIAL HISTORY: Social History   Socioeconomic History   Marital status: Married    Spouse name: Not on file   Number of children: 5   Years of education: 9th grade   Highest education level: Not on file  Occupational History   Occupation: retired  Tobacco Use   Smoking status: Former    Current packs/day: 0.00    Types: Cigarettes    Quit date: 01/17/2003    Years since quitting: 21.1   Smokeless tobacco: Never  Vaping Use   Vaping status: Never  Used  Substance and Sexual  Activity   Alcohol use: Not Currently    Comment: very rare   Drug use: No   Sexual activity: Not Currently  Other Topics Concern   Not on file  Social History Narrative   Lives at home with his wife.   Right-handed.   At most, one cup caffeine per day.    Social Drivers of Corporate investment banker Strain: Low Risk  (05/05/2023)   Overall Financial Resource Strain (CARDIA)    Difficulty of Paying Living Expenses: Not hard at all  Food Insecurity: No Food Insecurity (12/22/2023)   Hunger Vital Sign    Worried About Running Out of Food in the Last Year: Never true    Ran Out of Food in the Last Year: Never true  Transportation Needs: No Transportation Needs (12/22/2023)   PRAPARE - Administrator, Civil Service (Medical): No    Lack of Transportation (Non-Medical): No  Physical Activity: Insufficiently Active (05/05/2023)   Exercise Vital Sign    Days of Exercise per Week: 1 day    Minutes of Exercise per Session: 40 min  Stress: No Stress Concern Present (05/05/2023)   [employer] of Occupational Health - Occupational Stress Questionnaire    Feeling of Stress : Not at all  Social Connections: Socially Integrated (05/05/2023)   Social Connection and Isolation Panel [NHANES]    Frequency of Communication with Friends and Family: More than three times a week    Frequency of Social Gatherings with Friends and Family: More than three times a week    Attends Religious Services: More than 4 times per year    Active Member of [employer] or Organizations: Yes    Attends Banker Meetings: More than 4 times per year    Marital Status: Married  Catering manager Violence: Not At Risk (12/22/2023)   Humiliation, Afraid, Rape, and Kick questionnaire    Fear of Current or Ex-Partner: No    Emotionally Abused: No    Physically Abused: No    Sexually Abused: No      Phebe Brasil, M.D. Ph.D.  [facility] [address]  [address] [place], [place] [address] Ph: [phone] Fax: [phone]  CC:  Elvira Hammersmith, MD [address] [place],  [place] [address]  Elvira Hammersmith, MD   The patient's condition necessitates ongoing care to continuously monitor and adjust the treatment plan, reflecting the complexity and evolving nature of his medical needs. I/our office will serve as the central point of coordination for all necessary services related to this condition.

## 2024-03-11 DIAGNOSIS — M25512 Pain in left shoulder: Secondary | ICD-10-CM | POA: Diagnosis not present

## 2024-03-24 ENCOUNTER — Telehealth: Payer: Self-pay | Admitting: Emergency Medicine

## 2024-03-24 ENCOUNTER — Other Ambulatory Visit: Payer: Self-pay | Admitting: Emergency Medicine

## 2024-03-24 DIAGNOSIS — F419 Anxiety disorder, unspecified: Secondary | ICD-10-CM

## 2024-03-24 NOTE — Telephone Encounter (Signed)
 Copied from CRM [id number]. Topic: Clinical - Medication Question >> Mar 24, 2024 11:15 AM Viola F wrote: Reason for CRM: Patient spouse [name] says the pharmacy told them that the sertraline  (ZOLOFT ) 100 MG tablet has been discontinued and she would like to know why? She wonders if it's because patients Neurologist prescribed him the Divalproexer 500mg  medication? Please call her at [phone] - she wants to know if patient should stop taking the Zoloft  medication.

## 2024-03-25 NOTE — Telephone Encounter (Signed)
 Called pt and spoke to pt's daughter about the clarification of the discontinue of pt Zoloft . Made her aware that medication is no longer in his active medication list and not sure who/why it was discontinue. Let her be aware I will reach out to Dr. Purcell if pt still need medication.  Daughter stated that they still have some medication left and will not need as there is a chance pt other provider took him off medication.

## 2024-05-03 DIAGNOSIS — Z0000 Encounter for general adult medical examination without abnormal findings: Secondary | ICD-10-CM | POA: Diagnosis not present

## 2024-05-03 DIAGNOSIS — I509 Heart failure, unspecified: Secondary | ICD-10-CM | POA: Diagnosis not present

## 2024-05-03 DIAGNOSIS — E785 Hyperlipidemia, unspecified: Secondary | ICD-10-CM | POA: Diagnosis not present

## 2024-05-03 DIAGNOSIS — C9590 Leukemia, unspecified not having achieved remission: Secondary | ICD-10-CM | POA: Diagnosis not present

## 2024-05-03 DIAGNOSIS — R2681 Unsteadiness on feet: Secondary | ICD-10-CM | POA: Diagnosis not present

## 2024-05-03 DIAGNOSIS — F01518 Vascular dementia, unspecified severity, with other behavioral disturbance: Secondary | ICD-10-CM | POA: Diagnosis not present

## 2024-05-03 DIAGNOSIS — R1110 Vomiting, unspecified: Secondary | ICD-10-CM | POA: Diagnosis not present

## 2024-05-03 DIAGNOSIS — R296 Repeated falls: Secondary | ICD-10-CM | POA: Diagnosis not present

## 2024-05-03 DIAGNOSIS — I119 Hypertensive heart disease without heart failure: Secondary | ICD-10-CM | POA: Diagnosis not present

## 2024-05-05 ENCOUNTER — Ambulatory Visit: Payer: Medicare HMO

## 2024-05-05 VITALS — Ht 71.0 in | Wt 151.0 lb

## 2024-05-05 DIAGNOSIS — Z0000 Encounter for general adult medical examination without abnormal findings: Secondary | ICD-10-CM | POA: Diagnosis not present

## 2024-05-05 NOTE — Progress Notes (Signed)
 Subjective:   Javier [name] is a 83 y.o. who presents for a Medicare Wellness preventive visit.  As a reminder, Annual Wellness Visits don't include a physical exam, and some assessments may be limited, especially if this visit is performed virtually. We may recommend an in-person follow-up visit with your provider if needed.  Visit Complete: Virtual I connected with  Javier [name] on 05/05/24 by a audio enabled telemedicine application and verified that I am speaking with the correct person using two identifiers.  Patient Location: Home  Provider Location: Office/Clinic  I discussed the limitations of evaluation and management by telemedicine. The patient expressed understanding and agreed to proceed.  Vital Signs: Because this visit was a virtual/telehealth visit, some criteria may be missing or patient reported. Any vitals not documented were not able to be obtained and vitals that have been documented are patient reported.  VideoDeclined- This patient declined Librarian, academic. Therefore the visit was completed with audio only.  Persons Participating in Visit: Patient assisted by his wife.  AWV Questionnaire: No: Patient Medicare AWV questionnaire was not completed prior to this visit.  Cardiac Risk Factors include: male gender;advanced age (>75men, >36 women);hypertension;Other (see comment);dyslipidemia, Risk factor comments: BPH     Objective:    Today's Vitals   05/05/24 1010  Weight: 151 lb (68.5 kg)  Height: 5' 11 (1.803 m)   Body mass index is 21.06 kg/m.     05/05/2024   10:19 AM 10/24/2023    3:46 PM 10/22/2023    1:49 PM 05/05/2023   10:04 AM 03/24/2023    4:37 PM 04/30/2022   11:03 AM 04/29/2022    1:37 PM  Advanced Directives  Does Patient Have a Medical Advance Directive? Yes No Yes Yes No Yes No  Type of Estate agent of [place];Living will  Healthcare Power of [place];Living will Healthcare Power of  [place];Living will     Does patient want to make changes to medical advance directive?   No - Patient declined      Copy of Healthcare Power of Attorney in Chart? No - copy requested  No - copy requested No - copy requested     Would patient like information on creating a medical advance directive?       No - Patient declined    Current Medications (verified) Outpatient Encounter Medications as of 05/05/2024  Medication Sig   aspirin  EC 81 MG tablet Take 81 mg by mouth daily.   busPIRone  (BUSPAR ) 5 MG tablet TAKE 1 TABLET BY MOUTH THREE TIMES DAILY FOR  NERVOUSNESS  AND  CHRONIC  ANXIETY   Cholecalciferol  (VITAMIN D3) 5000 units TABS Take 5,000 Units by mouth daily.   hydrocortisone  (ANUSOL -HC) 2.5 % rectal cream Place 1 Application rectally 2 (two) times daily.   lipase/protease/amylase (CREON ) 12000-38000 units CPEP capsule Take 12,000 Units by mouth 3 (three) times daily with meals.   losartan  (COZAAR ) 100 MG tablet Take 1 tablet (100 mg total) by mouth daily.   QUEtiapine  (SEROQUEL ) 50 MG tablet Take 1 tablet (50 mg total) by mouth 2 (two) times daily.   rosuvastatin  (CRESTOR ) 20 MG tablet TAKE 1 TABLET EVERY DAY FOR CHOLESTEROL   divalproex  (DEPAKOTE  ER) 500 MG 24 hr tablet Take 1 tablet (500 mg total) by mouth at bedtime. (Patient not taking: Reported on 05/05/2024)   No facility-administered encounter medications on file as of 05/05/2024.    Allergies (verified) Penicillins and Ppd [tuberculin purified protein derivative]  History: Past Medical History:  Diagnosis Date   Diverticulosis    Hyperlipidemia    Hypertension    Memory loss    Prediabetes    Vitamin D  deficiency    Past Surgical History:  Procedure Laterality Date   NO PAST SURGERIES     Family History  Problem Relation Age of Onset   Stroke Mother    Early death Father        Farm/tractor accident   Diabetes Brother    Cirrhosis Brother    Colon cancer Paternal Grandfather    Social History    Socioeconomic History   Marital status: Married    Spouse name: [name]   Number of children: 5   Years of education: 9th grade   Highest education level: Not on file  Occupational History   Occupation: retired  Tobacco Use   Smoking status: Former    Current packs/day: 0.00    Types: Cigarettes    Quit date: 01/17/2003    Years since quitting: 21.3   Smokeless tobacco: Never  Vaping Use   Vaping status: Never Used  Substance and Sexual Activity   Alcohol use: Not Currently    Comment: very rare   Drug use: No   Sexual activity: Not Currently  Other Topics Concern   Not on file  Social History Narrative   Lives at home with his wife.   Right-handed.   At most, one cup caffeine per day.    Social Drivers of Corporate investment banker Strain: Low Risk  (05/05/2024)   Overall Financial Resource Strain (CARDIA)    Difficulty of Paying Living Expenses: Not hard at all  Food Insecurity: No Food Insecurity (05/05/2024)   Hunger Vital Sign    Worried About Running Out of Food in the Last Year: Never true    Ran Out of Food in the Last Year: Never true  Transportation Needs: No Transportation Needs (05/05/2024)   PRAPARE - Administrator, Civil Service (Medical): No    Lack of Transportation (Non-Medical): No  Physical Activity: Inactive (05/05/2024)   Exercise Vital Sign    Days of Exercise per Week: 0 days    Minutes of Exercise per Session: 0 min  Stress: Stress Concern Present (05/05/2024)   [employer] of Occupational Health - Occupational Stress Questionnaire    Feeling of Stress: Very much  Social Connections: Moderately Integrated (05/05/2024)   Social Connection and Isolation Panel    Frequency of Communication with Friends and Family: Never    Frequency of Social Gatherings with Friends and Family: Never    Attends Religious Services: More than 4 times per year    Active Member of [employer] or Organizations: Yes    Attends Banker Meetings:  Never    Marital Status: Married    Tobacco Counseling Counseling given: Not Answered    Clinical Intake:  Pre-visit preparation completed: Yes  Pain : No/denies pain     BMI - recorded: 21.06 Nutritional Status: BMI of 19-24  Normal Nutritional Risks: Nausea/ vomitting/ diarrhea (vomitting) Diabetes: No  Lab Results  Component Value Date   HGBA1C 5.3 02/17/2023   HGBA1C 5.5 10/21/2022   HGBA1C 5.6 08/10/2021     How often do you need to have someone help you when you read instructions, pamphlets, or other written materials from your doctor or pharmacy?: 1 - Never  Interpreter Needed?: No  Information entered by :: Kerly Rigsbee, RMA   Activities of Daily  Living     05/05/2024   10:11 AM  In your present state of health, do you have any difficulty performing the following activities:  Hearing? 1  Comment Wears hearing aides  Vision? 0  Difficulty concentrating or making decisions? 0  Walking or climbing stairs? 0  Dressing or bathing? 0  Doing errands, shopping? 0  Comment wife drives him around  Preparing Food and eating ? N  Using the Toilet? N  In the past six months, have you accidently leaked urine? N  Do you have problems with loss of bowel control? N  Managing your Medications? N  Managing your Finances? N  Housekeeping or managing your Housekeeping? N    Patient Care Team: Purcell Emil Schanz, MD as PCP - General (Internal Medicine) Robinson Mayo, OD as Referring Physician (Optometry) Danis, Victory LITTIE MOULD, MD as Consulting Physician (Gastroenterology) Onesimo Emaline Brink, MD as Consulting Physician (Hematology)  I have updated your Care Teams any recent Medical Services you may have received from other providers in the past year.     Assessment:   This is a routine wellness examination for Wilfrid.  Hearing/Vision screen Hearing Screening - Comments:: Wears hearing aides Vision Screening - Comments:: Wears eyeglasses/[employer]/ [place]   Goals  Addressed   None    Depression Screen     05/05/2024   10:23 AM 02/18/2024    1:12 PM 10/26/2023    3:30 PM 07/14/2023    2:37 PM 05/05/2023   10:06 AM 02/17/2023    2:11 PM 10/21/2022    9:07 AM  PHQ 2/9 Scores  PHQ - 2 Score 3 1 4  0 0 0 0  PHQ- 9 Score 5  9  9       Fall Risk     05/05/2024   10:20 AM 03/01/2024    9:52 AM 02/18/2024    1:12 PM 10/26/2023    3:30 PM 07/14/2023    2:38 PM  Fall Risk   Falls in the past year? 1 1 1 1 1   Number falls in past yr: 1 1 1  0 1  Injury with Fall? 0 0 0 0 1  Risk for fall due to : Impaired balance/gait History of fall(s);Impaired balance/gait;Impaired mobility  Other (Comment) History of fall(s)  Follow up Falls evaluation completed;Falls prevention discussed   Falls evaluation completed Falls evaluation completed    MEDICARE RISK AT HOME:  Medicare Risk at Home Any stairs in or around the home?: No If so, are there any without handrails?: No Home free of loose throw rugs in walkways, pet beds, electrical cords, etc?: Yes Adequate lighting in your home to reduce risk of falls?: Yes Life alert?: Yes (has one but does not wear it) Use of a cane, walker or w/c?: Yes Grab bars in the bathroom?: Yes Shower chair or bench in shower?: Yes Elevated toilet seat or a handicapped toilet?: Yes  TIMED UP AND GO:  Was the test performed?  No  Cognitive Function: Unable: Due to language barrier, hearing or vision limitations or other.    03/01/2024    9:58 AM 12/20/2020    2:07 PM 05/25/2019    6:12 PM  MMSE - Mini Mental State Exam  Orientation to time 1 5 5   Orientation to Place 4 5 5   Registration 3 2 3   Attention/ Calculation 2 5 5   Recall 1 3 2   Language- name 2 objects 2 2 2   Language- repeat 0 1 1  Language- follow 3 step  command 2 3 3   Language- read & follow direction 1 1 1   Write a sentence 0 1 1  Copy design 0 0 1  Total score 16 28 29       10/29/2021    1:23 PM  Montreal Cognitive Assessment   Visuospatial/ Executive (0/5)  1  Naming (0/3) 3  Attention: Read list of digits (0/2) 2  Attention: Read list of letters (0/1) 1  Attention: Serial 7 subtraction starting at 100 (0/3) 3  Language: Repeat phrase (0/2) 1  Language : Fluency (0/1) 0  Abstraction (0/2) 1  Delayed Recall (0/5) 1  Orientation (0/6) 4  Total 17      Immunizations Immunization History  Administered Date(s) Administered   Fluad Quad(high Dose 65+) 06/09/2022   Fluad Trivalent(High Dose 65+) 07/14/2023   Influenza Split 07/28/2013   Influenza, High Dose Seasonal PF 09/11/2015, 06/09/2016, 09/15/2018   Influenza-Unspecified 06/09/2023   Moderna Covid-19 Fall Seasonal Vaccine 27yrs & older 08/04/2023   PFIZER Comirnaty(Gray Top)Covid-19 Tri-Sucrose Vaccine 11/05/2019, 11/26/2019, 08/18/2020, 04/19/2021   Pneumococcal Conjugate-13 11/05/2017   Pneumococcal-Unspecified 07/01/1999, 09/20/2009   RSV,unspecified 01/29/2023   Td 09/20/2009   Tdap 09/29/2013   Zoster Recombinant(Shingrix) 01/29/2023, 08/04/2023   Zoster, Live 11/08/2009    Screening Tests Health Maintenance  Topic Date Due   Pneumococcal Vaccine: 50+ Years (2 of 2 - PPSV23, PCV20, or PCV21) 12/31/2017   DTaP/Tdap/Td (3 - Td or Tdap) 09/30/2023   COVID-19 Vaccine (6 - Pfizer risk 2024-25 season) 02/01/2024   Colonoscopy  02/06/2024   INFLUENZA VACCINE  04/29/2024   Medicare Annual Wellness (AWV)  05/05/2025   Zoster Vaccines- Shingrix  Completed   Hepatitis B Vaccines  Aged Out   HPV VACCINES  Aged Out   Meningococcal B Vaccine  Aged Out   Hepatitis C Screening  Discontinued    Health Maintenance  Health Maintenance Due  Topic Date Due   Pneumococcal Vaccine: 50+ Years (2 of 2 - PPSV23, PCV20, or PCV21) 12/31/2017   DTaP/Tdap/Td (3 - Td or Tdap) 09/30/2023   COVID-19 Vaccine (6 - Pfizer risk 2024-25 season) 02/01/2024   Colonoscopy  02/06/2024   INFLUENZA VACCINE  04/29/2024   Health Maintenance Items Addressed: See Nurse Notes at the end of this  note  Additional Screening:  Vision Screening: Recommended annual ophthalmology exams for early detection of glaucoma and other disorders of the eye. Would you like a referral to an eye doctor? No    Dental Screening: Recommended annual dental exams for proper oral hygiene  Community Resource Referral / Chronic Care Management: CRR required this visit?  No   CCM required this visit?  No   Plan:    I have personally reviewed and noted the following in the patient's chart:   Medical and social history Use of alcohol, tobacco or illicit drugs  Current medications and supplements including opioid prescriptions. Patient is not currently taking opioid prescriptions. Functional ability and status Nutritional status Physical activity Advanced directives List of other physicians Hospitalizations, surgeries, and ER visits in previous 12 months Vitals Screenings to include cognitive, depression, and falls Referrals and appointments  In addition, I have reviewed and discussed with patient certain preventive protocols, quality metrics, and best practice recommendations. A written personalized care plan for preventive services as well as general preventive health recommendations were provided to patient.   Ever Gustafson L Mishawn Didion, CMA   05/05/2024   After Visit Summary: (Mail) Due to this being a telephonic visit, the after visit summary with  patients personalized plan was offered to patient via mail   Notes: Patient stated that he has received his pneumonia vaccine and Flu vaccine from the [facility] hospital recently.  Patient declines a colonoscopy.  He had no other concerns to address today.

## 2024-05-05 NOTE — Patient Instructions (Signed)
 Javier [name] , Thank you for taking time out of your busy schedule to complete your Annual Wellness Visit with me. I enjoyed our conversation and look forward to speaking with you again next year. I, as well as your care team,  appreciate your ongoing commitment to your health goals. Please review the following plan we discussed and let me know if I can assist you in the future. Your Game plan/ To Do List     Follow up Visits: We will see or speak with you next year for your Next Medicare AWV with our clinical staff Have you seen your provider in the last 6 months (3 months if uncontrolled diabetes)? Yes  Clinician Recommendations:  Aim for 30 minutes of exercise or brisk walking, 6-8 glasses of water, and 5 servings of fruits and vegetables each day. Remember to have your immunizations sent over to office from the [facility] hospital, for your pneumonia vaccine and your Flu vaccine.        This is a list of the screenings recommended for you:  Health Maintenance  Topic Date Due   Pneumococcal Vaccine for age over 80 (2 of 2 - PPSV23, PCV20, or PCV21) 12/31/2017   DTaP/Tdap/Td vaccine (3 - Td or Tdap) 09/30/2023   COVID-19 Vaccine (6 - Pfizer risk 2024-25 season) 02/01/2024   Colon Cancer Screening  02/06/2024   Flu Shot  04/29/2024   Medicare Annual Wellness Visit  05/05/2025   Zoster (Shingles) Vaccine  Completed   Hepatitis B Vaccine  Aged Out   HPV Vaccine  Aged Out   Meningitis B Vaccine  Aged Out   Hepatitis C Screening  Discontinued    Advanced directives: (Copy Requested) Please bring a copy of your health care power of attorney and living will to the office to be added to your chart at your convenience. You can mail to [facility] [address] [place], [place] [address] or email to [email or web address] Advance Care Planning is important because it:  [x]  Makes sure you receive the medical care that is consistent with your values, goals, and preferences  [x]   It provides guidance to your family and loved ones and reduces their decisional burden about whether or not they are making the right decisions based on your wishes.  Follow the link provided in your after visit summary or read over the paperwork we have mailed to you to help you started getting your Advance Directives in place. If you need assistance in completing these, please reach out to us  so that we can help you!  See attachments for Preventive Care and Fall Prevention Tips.

## 2024-05-09 ENCOUNTER — Ambulatory Visit: Payer: Self-pay

## 2024-05-09 NOTE — Telephone Encounter (Signed)
 FYI Only or Action Required?: FYI only for provider.  Patient was last seen in primary care on 02/18/2024 by Purcell Emil Schanz, MD.  Called Nurse Triage reporting Anxiety and Excessive Sweating.  Symptoms began several weeks ago.  Interventions attempted: Prescription medications: Quetiapine  and Buspar  .  Symptoms are: gradually worsening.  Triage Disposition: See Physician Within 24 Hours  Patient/caregiver understands and will follow disposition?: Yes                             Copied from CRM [id number]. Topic: Clinical - Red Word Triage >> May 09, 2024  4:01 PM Rea C wrote: Red Word that prompted transfer to Nurse Triage: Patient's anxiety is bad, in and out, breaking out in sweats. Reason for Disposition  Patient sounds very upset or troubled to the triager  Answer Assessment - Initial Assessment Questions 1. CONCERN: Did anything happen that prompted you to call today?      Increase in frequency of anxiety attacks 2. ANXIETY SYMPTOMS: Can you describe how you (your loved one; patient) have been feeling? (e.g., tense, restless, panicky, anxious, keyed up, overwhelmed, sense of impending doom).      Panicky and restless  3. ONSET: How long have you been feeling this way? (e.g., hours, days, weeks)     Longer than a year, worsening past 2-3 weeks  4. SEVERITY: How would you rate the level of anxiety? (e.g., 0 - 10; or mild, moderate, severe).     Anxiety attacks are starting to happen all day and all night  5. FUNCTIONAL IMPAIRMENT: How have these feelings affected your ability to do daily activities? Have you had more difficulty than usual doing your normal daily activities? (e.g., getting better, same, worse; self-care, school, work, interactions)     Wife states caregiver is assisting with ADLs and self care 6. HISTORY: Have you felt this way before? Have you ever been diagnosed with an anxiety problem in the past? (e.g.,  generalized anxiety disorder, panic attacks, PTSD). If Yes, ask: How was this problem treated? (e.g., medicines, counseling, etc.)     Yes  7. RISK OF HARM - SUICIDAL IDEATION: Do you ever have thoughts of hurting or killing yourself? If Yes, ask:  Do you have these feelings now? Do you have a plan on how you would do this?     Denies, patient is safe at this time 8. TREATMENT:  What has been done so far to treat this anxiety? (e.g., medicines, relaxation strategies). What has helped?     Quetiapine  and Buspar   9. THERAPIST: Do you have a counselor or therapist? If Yes, ask: What is their name?     Denies 11. PATIENT SUPPORT: Who is with you now? Who do you live with? Do you have family or friends who you can talk to?      Wife and grandson are currently with patient 24. OTHER SYMPTOMS: Do you have any other symptoms? (e.g., feeling depressed, trouble concentrating, trouble sleeping, trouble breathing, palpitations or fast heartbeat, chest pain, sweating, nausea, or diarrhea)       Sweating, pacing in and outside, lack of appetite, denies chest pain, denies difficulty breathing    Wife and grandson called on patient's behalf. Patient is currently with family members and is safe at this time. Patient's anxiety attacks are increasing in frequency and becoming hard for his wife and family to manage. Wife is seeking medication adjustment for patient. No availability  with PCP until Thursday. Wife requested sooner appointment. Scheduled with alternate provider in office tomorrow. Advised wife to take patient to ED if symptoms worsen. Wife verbalized understanding.  Protocols used: Anxiety and Panic Attack-A-AH

## 2024-05-10 ENCOUNTER — Encounter: Payer: Self-pay | Admitting: Internal Medicine

## 2024-05-10 ENCOUNTER — Ambulatory Visit (INDEPENDENT_AMBULATORY_CARE_PROVIDER_SITE_OTHER): Admitting: Internal Medicine

## 2024-05-10 VITALS — BP 102/68 | HR 61 | Temp 97.7°F | Ht 71.0 in | Wt 147.0 lb

## 2024-05-10 DIAGNOSIS — I10 Essential (primary) hypertension: Secondary | ICD-10-CM | POA: Diagnosis not present

## 2024-05-10 DIAGNOSIS — E559 Vitamin D deficiency, unspecified: Secondary | ICD-10-CM | POA: Diagnosis not present

## 2024-05-10 DIAGNOSIS — F03C11 Unspecified dementia, severe, with agitation: Secondary | ICD-10-CM

## 2024-05-10 MED ORDER — BREXPIPRAZOLE 2 MG PO TABS: 2.0000 mg | ORAL_TABLET | Freq: Every day | ORAL | 11 refills | Status: DC

## 2024-05-10 NOTE — Progress Notes (Signed)
 Patient ID: Javier [name], male   DOB: 1940/10/29, 83 y.o.   MRN: [id number]        Chief Complaint: follow up worsening dementia with agitation, htn, low vit d,        HPI:  Javier [name] is a 83 y.o. male here with wife with c/o worsening anxiety and agitation several times per day, sometimes up in the night wandering about, but never abusive in language or deed.  No recent falls.  Pt wife denies chest pain, increased sob or doe, wheezing, orthopnea, PND, increased LE swelling, palpitations, dizziness or syncope.   Pt wife denies polydipsia, polyuria, or new focal neuro s/s.  Has been taking seroquel  but even double dosing in the past wk has not been able to control his behavior well.         Wt Readings from Last 3 Encounters:  05/10/24 147 lb (66.7 kg)  05/05/24 151 lb (68.5 kg)  03/01/24 151 lb (68.5 kg)   BP Readings from Last 3 Encounters:  05/10/24 102/68  03/01/24 127/73  02/18/24 130/78         Past Medical History:  Diagnosis Date   Diverticulosis    Hyperlipidemia    Hypertension    Memory loss    Prediabetes    Vitamin D  deficiency    Past Surgical History:  Procedure Laterality Date   NO PAST SURGERIES      reports that he quit smoking about 21 years ago. His smoking use included cigarettes. He has never used smokeless tobacco. He reports that he does not currently use alcohol. He reports that he does not use drugs. family history includes Cirrhosis in his brother; Colon cancer in his paternal grandfather; Diabetes in his brother; Early death in his father; Stroke in his mother. 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     busPIRone  (BUSPAR ) 5 MG  tablet TAKE 1 TABLET BY MOUTH THREE TIMES DAILY FOR  NERVOUSNESS  AND  CHRONIC  ANXIETY 270 tablet 0   Cholecalciferol  (VITAMIN D3) 5000 units TABS Take 5,000 Units by mouth daily.     hydrocortisone  (ANUSOL -HC) 2.5 % rectal cream Place 1 Application rectally 2 (two) times daily. 30 g 0   lipase/protease/amylase (CREON ) 12000-38000 units CPEP capsule Take 12,000 Units by mouth 3 (three) times daily with meals.     losartan  (COZAAR ) 100 MG tablet Take 1 tablet (100 mg total) by mouth daily. 90 tablet 0   QUEtiapine  (SEROQUEL ) 50 MG tablet Take 1 tablet (50 mg total) by mouth 2 (two) times daily. 180 tablet 3   rosuvastatin  (CRESTOR ) 20 MG tablet TAKE 1 TABLET EVERY DAY FOR CHOLESTEROL 90 tablet 10   divalproex  (DEPAKOTE  ER) 500 MG 24 hr tablet Take 1 tablet (500 mg total) by mouth at bedtime. (Patient not taking: Reported on 05/10/2024) 30 tablet 11   No current facility-administered medications on file prior to visit.        ROS:  All others reviewed and negative.  Objective        PE:  BP 102/68   Pulse 61   Temp 97.7 F (36.5 C)  Ht 5' 11 (1.803 m)   Wt 147 lb (66.7 kg)   SpO2 98%   BMI 20.50 kg/m                 Constitutional: Pt appears in NAD               HENT: Head: NCAT.                Right Ear: External ear normal.                 Left Ear: External ear normal.                Eyes: . Pupils are equal, round, and reactive to light. Conjunctivae and EOM are normal               Nose: without d/c or deformity               Neck: Neck supple. Gross normal ROM               Cardiovascular: Normal rate and regular rhythm.                 Pulmonary/Chest: Effort normal and breath sounds without rales or wheezing.                Abd:  Soft, NT, ND, + BS, no organomegaly               Neurological: Pt is alert. At baseline orientation, motor grossly intact               Skin: Skin is warm. No rashes, no other new lesions, LE edema - none               Psychiatric: Pt behavior  is normal without agitation at this time,  Micro: none  Cardiac tracings I have personally interpreted today:  none  Pertinent Radiological findings (summarize): none   Lab Results  Component Value Date   WBC 6.1 10/24/2023   HGB 12.4 (L) 10/24/2023   HCT 38.2 (L) 10/24/2023   PLT 146 (L) 10/24/2023   GLUCOSE 94 10/24/2023   CHOL 183 12/18/2021   TRIG 69 12/18/2021   HDL 55 12/18/2021   LDLCALC 112 (H) 12/18/2021   ALT 11 03/24/2023   AST 16 03/24/2023   NA 138 10/24/2023   K 3.8 10/24/2023   CL 104 10/24/2023   CREATININE 1.07 10/24/2023   BUN 17 10/24/2023   CO2 23 10/24/2023   TSH 1.17 10/21/2022   PSA 2.87 01/09/2021   INR 1.1 08/09/2021   HGBA1C 5.3 02/17/2023   MICROALBUR 0.8 01/09/2021   Assessment/Plan:  Javier [name] is a 83 y.o. Black or African American [2] male with  has a past medical history of Diverticulosis, Hyperlipidemia, Hypertension, Memory loss, Prediabetes, and Vitamin D  deficiency.  Vitamin D  deficiency Last vitamin D  Lab Results  Component Value Date   VD25OH 89 08/27/2021   Stable, cont oral replacement   Severe dementia with agitation (HCC) Mod to severe worsening, seroquel  no longer seems to work well; pt now for rexulti  starter pack - 0.5 mg daily x 1 wk, 1 mg every day x 1 wk, then 2 mg after that,  to f/u any worsening symptoms or concerns   Hypertension BP Readings from Last 3 Encounters:  05/10/24 102/68  03/01/24 127/73  02/18/24 130/78   Stable, pt to continue medical treatment losartan  100 mg qd  Followup: Return  if symptoms worsen or fail to improve.  Lynwood Rush, MD 05/10/2024 7:23 PM [facility] - [facility]

## 2024-05-10 NOTE — Assessment & Plan Note (Signed)
Last vitamin D Lab Results  Component Value Date   VD25OH 89 08/27/2021   Stable, cont oral replacement

## 2024-05-10 NOTE — Patient Instructions (Signed)
 Ok to change the seroquel  to Rexulti  - you are given the starter pack, and also a prescription is sent to [facility]  Please continue all other medications as before, and refills have been done if requested.  Please have the pharmacy call with any other refills you may need.  Please keep your appointments with your specialists as you may have planned

## 2024-05-10 NOTE — Assessment & Plan Note (Signed)
 Mod to severe worsening, seroquel  no longer seems to work well; pt now for rexulti  starter pack - 0.5 mg daily x 1 wk, 1 mg every day x 1 wk, then 2 mg after that,  to f/u any worsening symptoms or concerns

## 2024-05-10 NOTE — Assessment & Plan Note (Signed)
 BP Readings from Last 3 Encounters:  05/10/24 102/68  03/01/24 127/73  02/18/24 130/78   Stable, pt to continue medical treatment losartan  100 mg qd

## 2024-05-11 ENCOUNTER — Other Ambulatory Visit (HOSPITAL_COMMUNITY): Payer: Self-pay

## 2024-05-11 ENCOUNTER — Telehealth: Payer: Self-pay

## 2024-05-11 NOTE — Telephone Encounter (Signed)
 Pharmacy Patient Advocate Encounter  Received notification from [employer] that Prior Authorization for Rexulti  2 has been APPROVED from 09/30/23 to 09/28/24. Ran test claim, Copay is $586.22. This test claim was processed through [facility]- copay amounts may vary at other pharmacies due to pharmacy/plan contracts, or as the patient moves through the different stages of their insurance plan.   I tried to find a copay coupon but they don't work with his plan. There is a med assist program that would bring the copay to $21 but its not through [facility]

## 2024-05-11 NOTE — Telephone Encounter (Unsigned)
 Copied from CRM #[id number]. Topic: General - Other >> May 11, 2024 12:17 PM Mesmerise C wrote: Reason for CRM: Patient's wife [name] requesting patient be placed in nursing home with doctor's recommendation she can be reached at [phone]

## 2024-05-11 NOTE — Telephone Encounter (Signed)
 Pharmacy Patient Advocate Encounter   Received notification from CoverMyMeds that prior authorization for Rexulti  2 mg tabs is required/requested.   Insurance verification completed.   The patient is insured through [place] .   Per test claim: PA required; PA submitted to above mentioned insurance via Latent Key/confirmation #/EOC [id number] Status is pending

## 2024-05-12 NOTE — Telephone Encounter (Signed)
 How do we go about that?

## 2024-05-12 NOTE — Telephone Encounter (Signed)
 Copied from CRM #[id number]. Topic: General - Other >> May 12, 2024  1:56 PM Macario HERO wrote: Reason for CRM: Patient spouse called to check the status of her request for her spouse to get some assistance.

## 2024-05-16 DIAGNOSIS — C9590 Leukemia, unspecified not having achieved remission: Secondary | ICD-10-CM | POA: Diagnosis not present

## 2024-05-16 DIAGNOSIS — F01511 Vascular dementia, unspecified severity, with agitation: Secondary | ICD-10-CM | POA: Diagnosis not present

## 2024-05-16 DIAGNOSIS — I110 Hypertensive heart disease with heart failure: Secondary | ICD-10-CM | POA: Diagnosis not present

## 2024-05-16 DIAGNOSIS — E785 Hyperlipidemia, unspecified: Secondary | ICD-10-CM | POA: Diagnosis not present

## 2024-05-16 DIAGNOSIS — Z5982 Transportation insecurity: Secondary | ICD-10-CM | POA: Diagnosis not present

## 2024-05-16 DIAGNOSIS — I509 Heart failure, unspecified: Secondary | ICD-10-CM | POA: Diagnosis not present

## 2024-05-16 DIAGNOSIS — Z9181 History of falling: Secondary | ICD-10-CM | POA: Diagnosis not present

## 2024-05-16 DIAGNOSIS — R296 Repeated falls: Secondary | ICD-10-CM | POA: Diagnosis not present

## 2024-05-16 DIAGNOSIS — Z79899 Other long term (current) drug therapy: Secondary | ICD-10-CM | POA: Diagnosis not present

## 2024-05-20 DIAGNOSIS — J439 Emphysema, unspecified: Secondary | ICD-10-CM | POA: Diagnosis not present

## 2024-05-20 DIAGNOSIS — R1310 Dysphagia, unspecified: Secondary | ICD-10-CM | POA: Diagnosis not present

## 2024-05-20 DIAGNOSIS — Z79899 Other long term (current) drug therapy: Secondary | ICD-10-CM | POA: Diagnosis not present

## 2024-05-20 DIAGNOSIS — Z9189 Other specified personal risk factors, not elsewhere classified: Secondary | ICD-10-CM | POA: Diagnosis not present

## 2024-05-20 DIAGNOSIS — Z0001 Encounter for general adult medical examination with abnormal findings: Secondary | ICD-10-CM | POA: Diagnosis not present

## 2024-05-20 DIAGNOSIS — E559 Vitamin D deficiency, unspecified: Secondary | ICD-10-CM | POA: Diagnosis not present

## 2024-05-20 DIAGNOSIS — Z0189 Encounter for other specified special examinations: Secondary | ICD-10-CM | POA: Diagnosis not present

## 2024-05-20 DIAGNOSIS — M19022 Primary osteoarthritis, left elbow: Secondary | ICD-10-CM | POA: Diagnosis not present

## 2024-05-20 DIAGNOSIS — G8190 Hemiplegia, unspecified affecting unspecified side: Secondary | ICD-10-CM | POA: Diagnosis not present

## 2024-05-22 ENCOUNTER — Emergency Department (HOSPITAL_BASED_OUTPATIENT_CLINIC_OR_DEPARTMENT_OTHER)
Admission: EM | Admit: 2024-05-22 | Discharge: 2024-05-22 | Disposition: A | Discharge status: Home / Self Care | Attending: Emergency Medicine | Admitting: Emergency Medicine

## 2024-05-22 ENCOUNTER — Other Ambulatory Visit: Payer: Self-pay

## 2024-05-22 ENCOUNTER — Encounter (HOSPITAL_BASED_OUTPATIENT_CLINIC_OR_DEPARTMENT_OTHER): Payer: Self-pay

## 2024-05-22 ENCOUNTER — Emergency Department (HOSPITAL_BASED_OUTPATIENT_CLINIC_OR_DEPARTMENT_OTHER)

## 2024-05-22 DIAGNOSIS — K5900 Constipation, unspecified: Secondary | ICD-10-CM | POA: Insufficient documentation

## 2024-05-22 DIAGNOSIS — K6289 Other specified diseases of anus and rectum: Secondary | ICD-10-CM | POA: Diagnosis present

## 2024-05-22 DIAGNOSIS — R531 Weakness: Secondary | ICD-10-CM | POA: Diagnosis not present

## 2024-05-22 DIAGNOSIS — R4182 Altered mental status, unspecified: Secondary | ICD-10-CM | POA: Insufficient documentation

## 2024-05-22 DIAGNOSIS — E876 Hypokalemia: Secondary | ICD-10-CM | POA: Diagnosis not present

## 2024-05-22 DIAGNOSIS — F419 Anxiety disorder, unspecified: Secondary | ICD-10-CM | POA: Diagnosis not present

## 2024-05-22 DIAGNOSIS — R350 Frequency of micturition: Secondary | ICD-10-CM | POA: Diagnosis not present

## 2024-05-22 DIAGNOSIS — Z7982 Long term (current) use of aspirin: Secondary | ICD-10-CM | POA: Diagnosis not present

## 2024-05-22 DIAGNOSIS — F0390 Unspecified dementia without behavioral disturbance: Secondary | ICD-10-CM | POA: Diagnosis not present

## 2024-05-22 LAB — URINALYSIS, ROUTINE W REFLEX MICROSCOPIC
Bacteria, UA: NONE SEEN
Bilirubin Urine: NEGATIVE
Glucose, UA: NEGATIVE mg/dL
Hgb urine dipstick: NEGATIVE
Ketones, ur: 15 mg/dL — AB
Leukocytes,Ua: NEGATIVE
Nitrite: NEGATIVE
Protein, ur: 30 mg/dL — AB
Specific Gravity, Urine: 1.033 — ABNORMAL HIGH (ref 1.005–1.030)
pH: 6.5 (ref 5.0–8.0)

## 2024-05-22 LAB — COMPREHENSIVE METABOLIC PANEL WITH GFR
ALT: 13 U/L (ref 0–44)
AST: 23 U/L (ref 15–41)
Albumin: 4.2 g/dL (ref 3.5–5.0)
Alkaline Phosphatase: 44 U/L (ref 38–126)
Anion gap: 13 (ref 5–15)
BUN: 18 mg/dL (ref 8–23)
CO2: 21 mmol/L — ABNORMAL LOW (ref 22–32)
Calcium: 10.6 mg/dL — ABNORMAL HIGH (ref 8.9–10.3)
Chloride: 107 mmol/L (ref 98–111)
Creatinine, Ser: 1.13 mg/dL (ref 0.61–1.24)
GFR, Estimated: >60 mL/min (ref >60)
Glucose, Bld: 93 mg/dL (ref 70–99)
Potassium: 3.2 mmol/L — ABNORMAL LOW (ref 3.5–5.1)
Sodium: 141 mmol/L (ref 135–145)
Total Bilirubin: 0.6 mg/dL (ref 0.0–1.2)
Total Protein: 6.5 g/dL (ref 6.5–8.1)

## 2024-05-22 LAB — CBC
HCT: 32.7 % — ABNORMAL LOW (ref 39.0–52.0)
Hemoglobin: 11.1 g/dL — ABNORMAL LOW (ref 13.0–17.0)
MCH: 31.2 pg (ref 26.0–34.0)
MCHC: 33.9 g/dL (ref 30.0–36.0)
MCV: 91.9 fL (ref 80.0–100.0)
Platelets: 145 K/uL — ABNORMAL LOW (ref 150–400)
RBC: 3.56 MIL/uL — ABNORMAL LOW (ref 4.22–5.81)
RDW: 13.2 % (ref 11.5–15.5)
WBC: 5.8 K/uL (ref 4.0–10.5)
nRBC: 0 % (ref 0.0–0.2)

## 2024-05-22 MED ORDER — IOHEXOL 300 MG/ML  SOLN
80.0000 mL | Freq: Once | INTRAMUSCULAR | Status: AC | PRN
  Administered 2024-05-22: 80 mL via INTRAVENOUS

## 2024-05-22 MED ORDER — BISACODYL 5 MG PO TBEC
10.0000 mg | DELAYED_RELEASE_TABLET | Freq: Once | ORAL | Status: AC
  Administered 2024-05-22: 10 mg via ORAL
  Filled 2024-05-22: qty 2

## 2024-05-22 MED ORDER — POTASSIUM CHLORIDE CRYS ER 20 MEQ PO TBCR
40.0000 meq | EXTENDED_RELEASE_TABLET | Freq: Once | ORAL | Status: AC
  Administered 2024-05-22: 40 meq via ORAL
  Filled 2024-05-22: qty 2

## 2024-05-22 NOTE — ED Provider Notes (Signed)
 [place] EMERGENCY DEPARTMENT AT [facility] Provider Note   CSN: [id number] Arrival date & time: 05/22/24  1705     Patient presents with: Urinary Frequency, Weakness, Anxiety, and Altered Mental Status   Javier [name] is a 83 y.o. male brought in by his wife for rectal pain.  She reports that patient began complaining of pain in his bottom.  She states that last night he became very agitated saying that his bowels were on fire and asking her to call 911.  She states he had a lot of difficulty sleeping and seemed more confused than his normal baseline dementia.  Patient reports that he thinks he is constipated.  He has had no fevers nausea or vomiting.    Urinary Frequency  Weakness Associated symptoms: frequency   Anxiety  Altered Mental Status Associated symptoms: weakness        Prior to Admission medications   Medication Sig Start Date End Date Taking? Authorizing Provider  aspirin  EC 81 MG tablet Take 81 mg by mouth daily.    [provider]  brexpiprazole  (REXULTI ) 2 MG TABS tablet Take 1 tablet (2 mg total) by mouth daily. 05/10/24   Norleen Lynwood ORN, MD  busPIRone  (BUSPAR ) 5 MG tablet TAKE 1 TABLET BY MOUTH THREE TIMES DAILY FOR  NERVOUSNESS  AND  CHRONIC  ANXIETY 10/05/23   Purcell Emil Schanz, MD  Cholecalciferol  (VITAMIN D3) 5000 units TABS Take 5,000 Units by mouth daily.    [provider]  divalproex  (DEPAKOTE  ER) 500 MG 24 hr tablet Take 1 tablet (500 mg total) by mouth at bedtime. Patient not taking: Reported on 05/10/2024 03/01/24   Onita Duos, MD  hydrocortisone  (ANUSOL -HC) 2.5 % rectal cream Place 1 Application rectally 2 (two) times daily. 04/30/22   Long, Fonda MATSU, MD  lipase/protease/amylase (CREON ) 12000-38000 units CPEP capsule Take 12,000 Units by mouth 3 (three) times daily with meals.    [provider]  losartan  (COZAAR ) 100 MG tablet Take 1 tablet (100 mg total) by mouth daily. 09/23/22   Cranford, Tonya, NP   QUEtiapine  (SEROQUEL ) 50 MG tablet Take 1 tablet (50 mg total) by mouth 2 (two) times daily. 07/14/23 07/08/24  Purcell Emil Schanz, MD  rosuvastatin  (CRESTOR ) 20 MG tablet TAKE 1 TABLET EVERY DAY FOR CHOLESTEROL 07/14/22   Cranford, Tonya, NP    Allergies: Penicillins and Ppd [tuberculin purified protein derivative]    Review of Systems  Genitourinary:  Positive for frequency.  Neurological:  Positive for weakness.    Updated Vital Signs BP (!) 151/72 (BP Location: Right Arm)   Pulse (!) 59   Temp 97.6 F (36.4 C) (Oral)   Resp 16   SpO2 99%   Physical Exam Vitals and nursing note reviewed. Exam conducted with a chaperone present.  Constitutional:      General: He is not in acute distress.    Appearance: He is well-developed. He is not diaphoretic.  HENT:     Head: Normocephalic and atraumatic.  Eyes:     General: No scleral icterus.    Conjunctiva/sclera: Conjunctivae normal.  Cardiovascular:     Rate and Rhythm: Normal rate and regular rhythm.     Heart sounds: Normal heart sounds.  Pulmonary:     Effort: Pulmonary effort is normal. No respiratory distress.     Breath sounds: Normal breath sounds.  Abdominal:     General: There is no distension.     Palpations: Abdomen is soft.     Tenderness: There  is no abdominal tenderness.  Genitourinary:    Comments: Exam chaperoned by RN Ronal Mani On rectal examination patient has large firm stool in the rectal vault consistent with fecal impaction. Musculoskeletal:     Cervical back: Normal range of motion and neck supple.  Skin:    General: Skin is warm and dry.  Neurological:     Mental Status: He is alert.  Psychiatric:        Behavior: Behavior normal.     (all labs ordered are listed, but only abnormal results are displayed) Labs Reviewed  COMPREHENSIVE METABOLIC PANEL WITH GFR - Abnormal; Notable for the following components:      Result Value   Potassium 3.2 (*)    CO2 21 (*)    Calcium  10.6 (*)     All other components within normal limits  CBC - Abnormal; Notable for the following components:   RBC 3.56 (*)    Hemoglobin 11.1 (*)    HCT 32.7 (*)    Platelets 145 (*)    All other components within normal limits  URINALYSIS, ROUTINE W REFLEX MICROSCOPIC - Abnormal; Notable for the following components:   Specific Gravity, Urine 1.033 (*)    Ketones, ur 15 (*)    Protein, ur 30 (*)    All other components within normal limits  CBG MONITORING, ED    EKG: EKG Interpretation Date/Time:  Sunday May 22 2024 17:19:55 EDT Ventricular Rate:  70 PR Interval:  208 QRS Duration:  98 QT Interval:  410 QTC Calculation: 442 R Axis:   40  Text Interpretation: Normal sinus rhythm Incomplete right bundle branch block Borderline ECG When compared with ECG of 24-Mar-2023 19:42, PREVIOUS ECG IS PRESENT Confirmed by Mannie Pac [phone]) on 05/22/2024 5:22:46 PM  Radiology: CT ABDOMEN PELVIS W CONTRAST Result Date: 05/22/2024 CLINICAL DATA:  Incontinence and diarrhea. EXAM: CT ABDOMEN AND PELVIS WITH CONTRAST TECHNIQUE: Multidetector CT imaging of the abdomen and pelvis was performed using the standard protocol following bolus administration of intravenous contrast. RADIATION DOSE REDUCTION: This exam was performed according to the departmental dose-optimization program which includes automated exposure control, adjustment of the mA and/or kV according to patient size and/or use of iterative reconstruction technique. CONTRAST:  80mL OMNIPAQUE  IOHEXOL  300 MG/ML  SOLN COMPARISON:  March 24, 2023 FINDINGS: Lower chest: No acute abnormality. Hepatobiliary: No focal liver abnormality is seen. No gallstones, gallbladder wall thickening, or biliary dilatation. Pancreas: Unremarkable. No pancreatic ductal dilatation or surrounding inflammatory changes. Spleen: Normal in size without focal abnormality. Adrenals/Urinary Tract: Adrenal glands are unremarkable. Kidneys are normal in size, without renal calculi or  hydronephrosis. A 1.9 cm diameter simple cyst is seen within the left kidney. Bladder is unremarkable. Stomach/Bowel: Stomach is within normal limits. Appendix appears normal. A large amount of stool is seen throughout the colon. No evidence of bowel wall thickening, distention, or inflammatory changes. Vascular/Lymphatic: Aortic atherosclerosis. No enlarged abdominal or pelvic lymph nodes. Reproductive: There is moderate to marked severity prostate gland enlargement. Other: No abdominal wall hernia or abnormality. No abdominopelvic ascites. Musculoskeletal: No acute or significant osseous findings. IMPRESSION: 1. Large amount of stool throughout the colon. 2. Moderate to marked severity prostate gland enlargement. Correlation with PSA levels is recommended. 3. Aortic atherosclerosis. Electronically Signed   By: Suzen Dials M.D.   On: 05/22/2024 19:48     .Fecal disimpaction  Date/Time: 05/22/2024 11:24 PM  Performed by: Arloa Chroman, PA-C Authorized by: Arloa Chroman, PA-C  Consent: Verbal consent obtained Risks and  benefits: risks, benefits and alternatives were discussed Consent given by: spouse Patient identity confirmed: arm band Patient tolerance: patient tolerated the procedure well with no immediate complications Comments: Large frame of firm stool removed from the rectal vault      Medications Ordered in the ED  iohexol  (OMNIPAQUE ) 300 MG/ML solution 80 mL (80 mLs Intravenous Contrast Given 05/22/24 1941)  bisacodyl  (DULCOLAX) EC tablet 10 mg (10 mg Oral Given 05/22/24 2101)  potassium chloride  SA (KLOR-CON  M) CR tablet 40 mEq (40 mEq Oral Given 05/22/24 2101)                                    Medical Decision Making Amount and/or Complexity of Data Reviewed Labs: ordered. Radiology: ordered.  Risk OTC drugs. Prescription drug management.   83 year old man with a history of dementia complaining of rectal pain. On physical examination patient obviously has a  fecal impaction. Differential diagnosis also includes anal fissure, post hemorrhoid, proctitis, stercoral colitis.  I ordered and reviewed labs.  CMP shows mildly low potassium at 3.2.  CBC with hemoglobin 11.1 and platelet 145 no significant changes. Urine is negative for infection. EKG shows sinus rhythm at a rate of 70  I visualized and interpreted CT abdomen pelvis which shows no acute findings except for a large stool burden in the colon consistent with constipation.  Patient was given 10 mg of oral bisacodyl  here as well as 40 mill equivalents of potassium.  I have informed his wife of all findings on CT scan.  Will discharge with bowel regimen including large dose MiraLAX .  Discussed outpatient follow-up and strict return precautions with patient's wife at bedside.     Final diagnoses:  Constipation, unspecified constipation type    ED Discharge Orders     None          Arloa Chroman, PA-C 05/22/24 2329    Mannie Pac T, DO 05/23/24 1103

## 2024-05-22 NOTE — ED Notes (Signed)
 Unsuccessful attempt at collecting UA

## 2024-05-22 NOTE — ED Notes (Signed)
 Pt discharged home after verbalizing understanding of discharge instructions; nad noted.

## 2024-05-22 NOTE — ED Notes (Signed)
 Chaperoned and assisted PA  [name] with rectal exam and digital disimpaction.

## 2024-05-22 NOTE — ED Triage Notes (Signed)
 NIH negative in triage.

## 2024-05-22 NOTE — ED Triage Notes (Signed)
 Patient's family member reports patient has been weak, having urinary incontinence, and having anxiety. This started yesterday and she states he seems more confused as well.

## 2024-05-22 NOTE — Discharge Instructions (Addendum)
 Your husband had severe constipation and a fecal impaction. You will need to give him a constipation bowel regimen.   1) mix 6 capfuls of Miralax  in 34 oz of gatorade.Mix well and chill. 2) give your husband 8oz of the chilled liquid every 10 to 15 minutes until he has completed the entire bottle of liquid 3) he should begin to make large bowel movements later that day it may be similar to diarrhea.  If your husband has large-volume liquid stools he may not have another bowel movement for several days this is normal because of the clearance of stool in the abdomen. Your husband was also given a couple doses of a stimulant laxative to help begin his bowels moving tonight. 3. Other Supportive Measures  Encourage regular toileting after meals.  Increase fluid intake if possible.  Gradually add fiber to the diet (fruits, vegetables, whole grains), but do this slowly to avoid bloating.[1][4-5]  If the patient is unable to eat or drink enough, focus on the medication regimen. -- When to Seek Immediate Medical Attention Call the doctor or go to the emergency room if the patient has any of the following:  Severe abdominal pain or swelling  Vomiting that does not stop  Blood in the stool or rectal bleeding  No bowel movement for more than 3 days, especially with pain or vomiting  Signs of dehydration (dry mouth, no tears, very little urine)  Sudden confusion or change in alertness  Fever with abdominal symptoms These may be signs of a serious problem, such as bowel blockage, dehydration, or infection, and need urgent medical care.[3] -- Important Safety Notes  Always check with the doctor before starting or changing any medications.  Make sure the patient is drinking enough fluids, unless told otherwise by the doctor.  Watch for side effects like diarrhea, cramping, or weakness.  Do not use magnesium-based laxatives long-term in older adults due to risk of toxicity.[5-6]  If the patient has  kidney problems, heart problems, or is on other medications, let the doctor know before starting this regimen.[3]

## 2024-05-22 NOTE — ED Notes (Signed)
 Pt's wife has returned to the bedside. NT Edward relieved from sitting as she is with him.

## 2024-05-22 NOTE — ED Notes (Signed)
 Pt requested to go to the restroom; provided a urinal and pt unable to urinate. Pt's wife states that he is having episodes of incontinence, which he has not had in the past; states that he has some dementia and he seems to be progressing. She reports that he has pain in his back side and pt points to LLQ and RLQ when asked about his pain. Pt unable to sit still.  Wife believes he needs 24 hour care and she is unable to provide it.  NAD Noted.

## 2024-05-23 DIAGNOSIS — R296 Repeated falls: Secondary | ICD-10-CM | POA: Diagnosis not present

## 2024-05-23 DIAGNOSIS — Z79899 Other long term (current) drug therapy: Secondary | ICD-10-CM | POA: Diagnosis not present

## 2024-05-23 DIAGNOSIS — F01511 Vascular dementia, unspecified severity, with agitation: Secondary | ICD-10-CM | POA: Diagnosis not present

## 2024-05-23 DIAGNOSIS — I509 Heart failure, unspecified: Secondary | ICD-10-CM | POA: Diagnosis not present

## 2024-05-23 DIAGNOSIS — E785 Hyperlipidemia, unspecified: Secondary | ICD-10-CM | POA: Diagnosis not present

## 2024-05-23 DIAGNOSIS — I110 Hypertensive heart disease with heart failure: Secondary | ICD-10-CM | POA: Diagnosis not present

## 2024-05-23 DIAGNOSIS — Z9181 History of falling: Secondary | ICD-10-CM | POA: Diagnosis not present

## 2024-05-23 DIAGNOSIS — Z5982 Transportation insecurity: Secondary | ICD-10-CM | POA: Diagnosis not present

## 2024-05-23 DIAGNOSIS — C9590 Leukemia, unspecified not having achieved remission: Secondary | ICD-10-CM | POA: Diagnosis not present

## 2024-05-27 DIAGNOSIS — G4700 Insomnia, unspecified: Secondary | ICD-10-CM | POA: Diagnosis not present

## 2024-05-27 DIAGNOSIS — I509 Heart failure, unspecified: Secondary | ICD-10-CM | POA: Diagnosis not present

## 2024-05-27 DIAGNOSIS — G309 Alzheimer's disease, unspecified: Secondary | ICD-10-CM | POA: Diagnosis not present

## 2024-05-27 DIAGNOSIS — N401 Enlarged prostate with lower urinary tract symptoms: Secondary | ICD-10-CM | POA: Diagnosis not present

## 2024-05-27 DIAGNOSIS — R2681 Unsteadiness on feet: Secondary | ICD-10-CM | POA: Diagnosis not present

## 2024-05-27 DIAGNOSIS — K5900 Constipation, unspecified: Secondary | ICD-10-CM | POA: Diagnosis not present

## 2024-05-27 DIAGNOSIS — R1310 Dysphagia, unspecified: Secondary | ICD-10-CM | POA: Diagnosis not present

## 2024-05-27 DIAGNOSIS — I119 Hypertensive heart disease without heart failure: Secondary | ICD-10-CM | POA: Diagnosis not present

## 2024-05-27 DIAGNOSIS — F02811 Dementia in other diseases classified elsewhere, unspecified severity, with agitation: Secondary | ICD-10-CM | POA: Diagnosis not present

## 2024-05-31 DIAGNOSIS — I110 Hypertensive heart disease with heart failure: Secondary | ICD-10-CM | POA: Diagnosis not present

## 2024-05-31 DIAGNOSIS — C9590 Leukemia, unspecified not having achieved remission: Secondary | ICD-10-CM | POA: Diagnosis not present

## 2024-05-31 DIAGNOSIS — Z9181 History of falling: Secondary | ICD-10-CM | POA: Diagnosis not present

## 2024-05-31 DIAGNOSIS — Z79899 Other long term (current) drug therapy: Secondary | ICD-10-CM | POA: Diagnosis not present

## 2024-05-31 DIAGNOSIS — F01511 Vascular dementia, unspecified severity, with agitation: Secondary | ICD-10-CM | POA: Diagnosis not present

## 2024-05-31 DIAGNOSIS — I509 Heart failure, unspecified: Secondary | ICD-10-CM | POA: Diagnosis not present

## 2024-05-31 DIAGNOSIS — R296 Repeated falls: Secondary | ICD-10-CM | POA: Diagnosis not present

## 2024-05-31 DIAGNOSIS — E785 Hyperlipidemia, unspecified: Secondary | ICD-10-CM | POA: Diagnosis not present

## 2024-05-31 DIAGNOSIS — Z5982 Transportation insecurity: Secondary | ICD-10-CM | POA: Diagnosis not present

## 2024-06-08 DIAGNOSIS — R296 Repeated falls: Secondary | ICD-10-CM | POA: Diagnosis not present

## 2024-06-08 DIAGNOSIS — E785 Hyperlipidemia, unspecified: Secondary | ICD-10-CM | POA: Diagnosis not present

## 2024-06-08 DIAGNOSIS — Z79899 Other long term (current) drug therapy: Secondary | ICD-10-CM | POA: Diagnosis not present

## 2024-06-08 DIAGNOSIS — Z9181 History of falling: Secondary | ICD-10-CM | POA: Diagnosis not present

## 2024-06-08 DIAGNOSIS — F01511 Vascular dementia, unspecified severity, with agitation: Secondary | ICD-10-CM | POA: Diagnosis not present

## 2024-06-08 DIAGNOSIS — Z5982 Transportation insecurity: Secondary | ICD-10-CM | POA: Diagnosis not present

## 2024-06-08 DIAGNOSIS — I509 Heart failure, unspecified: Secondary | ICD-10-CM | POA: Diagnosis not present

## 2024-06-08 DIAGNOSIS — I110 Hypertensive heart disease with heart failure: Secondary | ICD-10-CM | POA: Diagnosis not present

## 2024-06-08 DIAGNOSIS — C9590 Leukemia, unspecified not having achieved remission: Secondary | ICD-10-CM | POA: Diagnosis not present

## 2024-06-10 NOTE — Telephone Encounter (Signed)
 Spoke with patients wife and she states the [place] is assisting them with this request

## 2024-06-14 DIAGNOSIS — C9590 Leukemia, unspecified not having achieved remission: Secondary | ICD-10-CM | POA: Diagnosis not present

## 2024-06-14 DIAGNOSIS — F01511 Vascular dementia, unspecified severity, with agitation: Secondary | ICD-10-CM | POA: Diagnosis not present

## 2024-06-14 DIAGNOSIS — Z5982 Transportation insecurity: Secondary | ICD-10-CM | POA: Diagnosis not present

## 2024-06-14 DIAGNOSIS — Z9181 History of falling: Secondary | ICD-10-CM | POA: Diagnosis not present

## 2024-06-14 DIAGNOSIS — E785 Hyperlipidemia, unspecified: Secondary | ICD-10-CM | POA: Diagnosis not present

## 2024-06-14 DIAGNOSIS — I110 Hypertensive heart disease with heart failure: Secondary | ICD-10-CM | POA: Diagnosis not present

## 2024-06-14 DIAGNOSIS — I509 Heart failure, unspecified: Secondary | ICD-10-CM | POA: Diagnosis not present

## 2024-06-14 DIAGNOSIS — R296 Repeated falls: Secondary | ICD-10-CM | POA: Diagnosis not present

## 2024-06-14 DIAGNOSIS — Z79899 Other long term (current) drug therapy: Secondary | ICD-10-CM | POA: Diagnosis not present

## 2024-06-15 DIAGNOSIS — F01511 Vascular dementia, unspecified severity, with agitation: Secondary | ICD-10-CM | POA: Diagnosis not present

## 2024-06-15 DIAGNOSIS — R296 Repeated falls: Secondary | ICD-10-CM | POA: Diagnosis not present

## 2024-06-15 DIAGNOSIS — Z9181 History of falling: Secondary | ICD-10-CM | POA: Diagnosis not present

## 2024-06-15 DIAGNOSIS — I509 Heart failure, unspecified: Secondary | ICD-10-CM | POA: Diagnosis not present

## 2024-06-15 DIAGNOSIS — Z5982 Transportation insecurity: Secondary | ICD-10-CM | POA: Diagnosis not present

## 2024-06-15 DIAGNOSIS — I110 Hypertensive heart disease with heart failure: Secondary | ICD-10-CM | POA: Diagnosis not present

## 2024-06-15 DIAGNOSIS — Z79899 Other long term (current) drug therapy: Secondary | ICD-10-CM | POA: Diagnosis not present

## 2024-06-15 DIAGNOSIS — E785 Hyperlipidemia, unspecified: Secondary | ICD-10-CM | POA: Diagnosis not present

## 2024-06-22 DIAGNOSIS — C9590 Leukemia, unspecified not having achieved remission: Secondary | ICD-10-CM | POA: Diagnosis not present

## 2024-06-30 DIAGNOSIS — R296 Repeated falls: Secondary | ICD-10-CM | POA: Diagnosis not present

## 2024-07-04 DIAGNOSIS — I509 Heart failure, unspecified: Secondary | ICD-10-CM | POA: Diagnosis not present

## 2024-07-04 DIAGNOSIS — R296 Repeated falls: Secondary | ICD-10-CM | POA: Diagnosis not present

## 2024-07-04 DIAGNOSIS — F01511 Vascular dementia, unspecified severity, with agitation: Secondary | ICD-10-CM | POA: Diagnosis not present

## 2024-07-04 DIAGNOSIS — E785 Hyperlipidemia, unspecified: Secondary | ICD-10-CM | POA: Diagnosis not present

## 2024-07-04 DIAGNOSIS — Z9181 History of falling: Secondary | ICD-10-CM | POA: Diagnosis not present

## 2024-07-04 DIAGNOSIS — C9590 Leukemia, unspecified not having achieved remission: Secondary | ICD-10-CM | POA: Diagnosis not present

## 2024-07-04 DIAGNOSIS — Z79899 Other long term (current) drug therapy: Secondary | ICD-10-CM | POA: Diagnosis not present

## 2024-07-04 DIAGNOSIS — I110 Hypertensive heart disease with heart failure: Secondary | ICD-10-CM | POA: Diagnosis not present

## 2024-07-12 DIAGNOSIS — F01511 Vascular dementia, unspecified severity, with agitation: Secondary | ICD-10-CM | POA: Diagnosis not present

## 2024-07-13 DIAGNOSIS — C9590 Leukemia, unspecified not having achieved remission: Secondary | ICD-10-CM | POA: Diagnosis not present

## 2024-07-13 DIAGNOSIS — R296 Repeated falls: Secondary | ICD-10-CM | POA: Diagnosis not present

## 2024-07-13 DIAGNOSIS — I509 Heart failure, unspecified: Secondary | ICD-10-CM | POA: Diagnosis not present

## 2024-07-13 DIAGNOSIS — F01511 Vascular dementia, unspecified severity, with agitation: Secondary | ICD-10-CM | POA: Diagnosis not present

## 2024-07-13 DIAGNOSIS — Z5982 Transportation insecurity: Secondary | ICD-10-CM | POA: Diagnosis not present

## 2024-07-13 DIAGNOSIS — Z9181 History of falling: Secondary | ICD-10-CM | POA: Diagnosis not present

## 2024-07-13 DIAGNOSIS — E785 Hyperlipidemia, unspecified: Secondary | ICD-10-CM | POA: Diagnosis not present

## 2024-07-13 DIAGNOSIS — Z79899 Other long term (current) drug therapy: Secondary | ICD-10-CM | POA: Diagnosis not present

## 2024-07-20 DIAGNOSIS — C9590 Leukemia, unspecified not having achieved remission: Secondary | ICD-10-CM | POA: Diagnosis not present

## 2024-07-20 DIAGNOSIS — Z5982 Transportation insecurity: Secondary | ICD-10-CM | POA: Diagnosis not present

## 2024-07-20 DIAGNOSIS — I509 Heart failure, unspecified: Secondary | ICD-10-CM | POA: Diagnosis not present

## 2024-07-20 DIAGNOSIS — R296 Repeated falls: Secondary | ICD-10-CM | POA: Diagnosis not present

## 2024-07-20 DIAGNOSIS — Z79899 Other long term (current) drug therapy: Secondary | ICD-10-CM | POA: Diagnosis not present

## 2024-07-20 DIAGNOSIS — Z9181 History of falling: Secondary | ICD-10-CM | POA: Diagnosis not present

## 2024-07-20 DIAGNOSIS — I110 Hypertensive heart disease with heart failure: Secondary | ICD-10-CM | POA: Diagnosis not present

## 2024-07-20 DIAGNOSIS — E785 Hyperlipidemia, unspecified: Secondary | ICD-10-CM | POA: Diagnosis not present

## 2024-07-20 DIAGNOSIS — F01511 Vascular dementia, unspecified severity, with agitation: Secondary | ICD-10-CM | POA: Diagnosis not present

## 2024-07-26 DIAGNOSIS — C9590 Leukemia, unspecified not having achieved remission: Secondary | ICD-10-CM | POA: Diagnosis not present

## 2024-07-26 DIAGNOSIS — E785 Hyperlipidemia, unspecified: Secondary | ICD-10-CM | POA: Diagnosis not present

## 2024-07-26 DIAGNOSIS — Z5982 Transportation insecurity: Secondary | ICD-10-CM | POA: Diagnosis not present

## 2024-07-26 DIAGNOSIS — F01511 Vascular dementia, unspecified severity, with agitation: Secondary | ICD-10-CM | POA: Diagnosis not present

## 2024-07-26 DIAGNOSIS — I509 Heart failure, unspecified: Secondary | ICD-10-CM | POA: Diagnosis not present

## 2024-07-26 DIAGNOSIS — Z79899 Other long term (current) drug therapy: Secondary | ICD-10-CM | POA: Diagnosis not present

## 2024-07-26 DIAGNOSIS — I110 Hypertensive heart disease with heart failure: Secondary | ICD-10-CM | POA: Diagnosis not present

## 2024-07-26 DIAGNOSIS — Z9181 History of falling: Secondary | ICD-10-CM | POA: Diagnosis not present

## 2024-07-27 DIAGNOSIS — E785 Hyperlipidemia, unspecified: Secondary | ICD-10-CM | POA: Diagnosis not present

## 2024-07-27 DIAGNOSIS — C9590 Leukemia, unspecified not having achieved remission: Secondary | ICD-10-CM | POA: Diagnosis not present

## 2024-07-27 DIAGNOSIS — F01511 Vascular dementia, unspecified severity, with agitation: Secondary | ICD-10-CM | POA: Diagnosis not present

## 2024-07-27 DIAGNOSIS — Z79899 Other long term (current) drug therapy: Secondary | ICD-10-CM | POA: Diagnosis not present

## 2024-07-27 DIAGNOSIS — Z9181 History of falling: Secondary | ICD-10-CM | POA: Diagnosis not present

## 2024-07-27 DIAGNOSIS — I509 Heart failure, unspecified: Secondary | ICD-10-CM | POA: Diagnosis not present

## 2024-07-27 DIAGNOSIS — I110 Hypertensive heart disease with heart failure: Secondary | ICD-10-CM | POA: Diagnosis not present

## 2024-07-27 DIAGNOSIS — R296 Repeated falls: Secondary | ICD-10-CM | POA: Diagnosis not present

## 2024-07-27 DIAGNOSIS — Z5982 Transportation insecurity: Secondary | ICD-10-CM | POA: Diagnosis not present

## 2024-08-03 DIAGNOSIS — Z5982 Transportation insecurity: Secondary | ICD-10-CM | POA: Diagnosis not present

## 2024-08-03 DIAGNOSIS — I509 Heart failure, unspecified: Secondary | ICD-10-CM | POA: Diagnosis not present

## 2024-08-03 DIAGNOSIS — C9590 Leukemia, unspecified not having achieved remission: Secondary | ICD-10-CM | POA: Diagnosis not present

## 2024-08-03 DIAGNOSIS — Z79899 Other long term (current) drug therapy: Secondary | ICD-10-CM | POA: Diagnosis not present

## 2024-08-03 DIAGNOSIS — E785 Hyperlipidemia, unspecified: Secondary | ICD-10-CM | POA: Diagnosis not present

## 2024-08-03 DIAGNOSIS — R296 Repeated falls: Secondary | ICD-10-CM | POA: Diagnosis not present

## 2024-08-03 DIAGNOSIS — I110 Hypertensive heart disease with heart failure: Secondary | ICD-10-CM | POA: Diagnosis not present

## 2024-08-03 DIAGNOSIS — Z9181 History of falling: Secondary | ICD-10-CM | POA: Diagnosis not present

## 2024-08-04 DIAGNOSIS — C9590 Leukemia, unspecified not having achieved remission: Secondary | ICD-10-CM | POA: Diagnosis not present

## 2024-08-04 DIAGNOSIS — E785 Hyperlipidemia, unspecified: Secondary | ICD-10-CM | POA: Diagnosis not present

## 2024-08-04 DIAGNOSIS — Z5982 Transportation insecurity: Secondary | ICD-10-CM | POA: Diagnosis not present

## 2024-08-04 DIAGNOSIS — F01511 Vascular dementia, unspecified severity, with agitation: Secondary | ICD-10-CM | POA: Diagnosis not present

## 2024-08-04 DIAGNOSIS — Z9181 History of falling: Secondary | ICD-10-CM | POA: Diagnosis not present

## 2024-08-04 DIAGNOSIS — I509 Heart failure, unspecified: Secondary | ICD-10-CM | POA: Diagnosis not present

## 2024-08-04 DIAGNOSIS — R296 Repeated falls: Secondary | ICD-10-CM | POA: Diagnosis not present

## 2024-08-04 DIAGNOSIS — Z79899 Other long term (current) drug therapy: Secondary | ICD-10-CM | POA: Diagnosis not present

## 2024-08-04 DIAGNOSIS — I110 Hypertensive heart disease with heart failure: Secondary | ICD-10-CM | POA: Diagnosis not present

## 2024-08-09 DIAGNOSIS — Z79899 Other long term (current) drug therapy: Secondary | ICD-10-CM | POA: Diagnosis not present

## 2024-08-09 DIAGNOSIS — I509 Heart failure, unspecified: Secondary | ICD-10-CM | POA: Diagnosis not present

## 2024-08-09 DIAGNOSIS — Z9181 History of falling: Secondary | ICD-10-CM | POA: Diagnosis not present

## 2024-08-09 DIAGNOSIS — R296 Repeated falls: Secondary | ICD-10-CM | POA: Diagnosis not present

## 2024-08-09 DIAGNOSIS — C9590 Leukemia, unspecified not having achieved remission: Secondary | ICD-10-CM | POA: Diagnosis not present

## 2024-08-09 DIAGNOSIS — I110 Hypertensive heart disease with heart failure: Secondary | ICD-10-CM | POA: Diagnosis not present

## 2024-08-09 DIAGNOSIS — Z5982 Transportation insecurity: Secondary | ICD-10-CM | POA: Diagnosis not present

## 2024-08-09 DIAGNOSIS — F01511 Vascular dementia, unspecified severity, with agitation: Secondary | ICD-10-CM | POA: Diagnosis not present

## 2024-08-09 DIAGNOSIS — E785 Hyperlipidemia, unspecified: Secondary | ICD-10-CM | POA: Diagnosis not present

## 2024-08-10 DIAGNOSIS — Z79899 Other long term (current) drug therapy: Secondary | ICD-10-CM | POA: Diagnosis not present

## 2024-08-10 DIAGNOSIS — Z9181 History of falling: Secondary | ICD-10-CM | POA: Diagnosis not present

## 2024-08-10 DIAGNOSIS — Z5982 Transportation insecurity: Secondary | ICD-10-CM | POA: Diagnosis not present

## 2024-08-10 DIAGNOSIS — E785 Hyperlipidemia, unspecified: Secondary | ICD-10-CM | POA: Diagnosis not present

## 2024-08-10 DIAGNOSIS — I110 Hypertensive heart disease with heart failure: Secondary | ICD-10-CM | POA: Diagnosis not present

## 2024-08-10 DIAGNOSIS — F01511 Vascular dementia, unspecified severity, with agitation: Secondary | ICD-10-CM | POA: Diagnosis not present

## 2024-08-10 DIAGNOSIS — C9590 Leukemia, unspecified not having achieved remission: Secondary | ICD-10-CM | POA: Diagnosis not present

## 2024-08-10 DIAGNOSIS — I509 Heart failure, unspecified: Secondary | ICD-10-CM | POA: Diagnosis not present

## 2024-08-17 DIAGNOSIS — E785 Hyperlipidemia, unspecified: Secondary | ICD-10-CM | POA: Diagnosis not present

## 2024-08-17 DIAGNOSIS — I509 Heart failure, unspecified: Secondary | ICD-10-CM | POA: Diagnosis not present

## 2024-08-17 DIAGNOSIS — C9590 Leukemia, unspecified not having achieved remission: Secondary | ICD-10-CM | POA: Diagnosis not present

## 2024-08-17 DIAGNOSIS — R296 Repeated falls: Secondary | ICD-10-CM | POA: Diagnosis not present

## 2024-08-17 DIAGNOSIS — F01511 Vascular dementia, unspecified severity, with agitation: Secondary | ICD-10-CM | POA: Diagnosis not present

## 2024-08-17 DIAGNOSIS — Z5982 Transportation insecurity: Secondary | ICD-10-CM | POA: Diagnosis not present

## 2024-08-17 DIAGNOSIS — I110 Hypertensive heart disease with heart failure: Secondary | ICD-10-CM | POA: Diagnosis not present

## 2024-08-17 DIAGNOSIS — Z79899 Other long term (current) drug therapy: Secondary | ICD-10-CM | POA: Diagnosis not present

## 2024-08-17 DIAGNOSIS — Z9181 History of falling: Secondary | ICD-10-CM | POA: Diagnosis not present

## 2024-08-18 DIAGNOSIS — Z79899 Other long term (current) drug therapy: Secondary | ICD-10-CM | POA: Diagnosis not present

## 2024-08-18 DIAGNOSIS — Z9181 History of falling: Secondary | ICD-10-CM | POA: Diagnosis not present

## 2024-08-18 DIAGNOSIS — Z5982 Transportation insecurity: Secondary | ICD-10-CM | POA: Diagnosis not present

## 2024-08-18 DIAGNOSIS — F01511 Vascular dementia, unspecified severity, with agitation: Secondary | ICD-10-CM | POA: Diagnosis not present

## 2024-08-18 DIAGNOSIS — C9590 Leukemia, unspecified not having achieved remission: Secondary | ICD-10-CM | POA: Diagnosis not present

## 2024-08-18 DIAGNOSIS — I509 Heart failure, unspecified: Secondary | ICD-10-CM | POA: Diagnosis not present

## 2024-08-18 DIAGNOSIS — E785 Hyperlipidemia, unspecified: Secondary | ICD-10-CM | POA: Diagnosis not present

## 2024-08-18 DIAGNOSIS — I110 Hypertensive heart disease with heart failure: Secondary | ICD-10-CM | POA: Diagnosis not present

## 2024-08-18 DIAGNOSIS — R296 Repeated falls: Secondary | ICD-10-CM | POA: Diagnosis not present

## 2024-08-24 DIAGNOSIS — C9590 Leukemia, unspecified not having achieved remission: Secondary | ICD-10-CM | POA: Diagnosis not present

## 2024-08-24 DIAGNOSIS — I110 Hypertensive heart disease with heart failure: Secondary | ICD-10-CM | POA: Diagnosis not present

## 2024-08-24 DIAGNOSIS — R296 Repeated falls: Secondary | ICD-10-CM | POA: Diagnosis not present

## 2024-08-24 DIAGNOSIS — I509 Heart failure, unspecified: Secondary | ICD-10-CM | POA: Diagnosis not present

## 2024-08-24 DIAGNOSIS — E785 Hyperlipidemia, unspecified: Secondary | ICD-10-CM | POA: Diagnosis not present

## 2024-08-24 DIAGNOSIS — F01511 Vascular dementia, unspecified severity, with agitation: Secondary | ICD-10-CM | POA: Diagnosis not present

## 2024-08-24 DIAGNOSIS — Z9181 History of falling: Secondary | ICD-10-CM | POA: Diagnosis not present

## 2024-08-24 DIAGNOSIS — Z79899 Other long term (current) drug therapy: Secondary | ICD-10-CM | POA: Diagnosis not present

## 2024-09-05 DIAGNOSIS — Z5982 Transportation insecurity: Secondary | ICD-10-CM | POA: Diagnosis not present

## 2024-09-05 DIAGNOSIS — F01511 Vascular dementia, unspecified severity, with agitation: Secondary | ICD-10-CM | POA: Diagnosis not present

## 2024-09-05 DIAGNOSIS — R296 Repeated falls: Secondary | ICD-10-CM | POA: Diagnosis not present

## 2024-09-05 DIAGNOSIS — E785 Hyperlipidemia, unspecified: Secondary | ICD-10-CM | POA: Diagnosis not present

## 2024-09-05 DIAGNOSIS — I509 Heart failure, unspecified: Secondary | ICD-10-CM | POA: Diagnosis not present

## 2024-09-05 DIAGNOSIS — Z9181 History of falling: Secondary | ICD-10-CM | POA: Diagnosis not present

## 2024-09-05 DIAGNOSIS — Z79899 Other long term (current) drug therapy: Secondary | ICD-10-CM | POA: Diagnosis not present

## 2024-09-05 DIAGNOSIS — I110 Hypertensive heart disease with heart failure: Secondary | ICD-10-CM | POA: Diagnosis not present

## 2024-09-05 DIAGNOSIS — C9590 Leukemia, unspecified not having achieved remission: Secondary | ICD-10-CM | POA: Diagnosis not present

## 2024-09-13 ENCOUNTER — Ambulatory Visit: Admitting: Adult Health

## 2024-09-13 ENCOUNTER — Encounter: Payer: Self-pay | Admitting: Adult Health

## 2024-09-13 VITALS — BP 128/68 | HR 68 | Ht 71.0 in | Wt 145.0 lb

## 2024-09-13 DIAGNOSIS — F03C4 Unspecified dementia, severe, with anxiety: Secondary | ICD-10-CM | POA: Diagnosis not present

## 2024-09-13 NOTE — Progress Notes (Signed)
 Chief Complaint  Patient presents with   Follow-up    Patient is in room 3 patient is occupied by his wife.  Patient is here for follow up visit due to memory loss, patient's wife is stating that patient is still having anxiety attacks that cause him to break out into a sweat and have to go outside to cool down. Patient's wife would also like to discuss patient losing weight, patients appetite is off and on, patient does have a caregiver that comes in three times a week.       ASSESSMENT AND PLAN   Javier [name] is a 83 y.o. male  Dementia with anxiety  MoCA examination 16/30 (prior 16/30)  Some increased episodes of anxiety during later day/evening although greatly improved  Unclear of exact medication regimen although per wife, currently on quetiapine , trazodone and venlafaxine, will request OV notes from new PCP to help clarify exact medications and dosages  Depending on current dose of venlafaxine, can consider increasing to further help with anxiety  Discussed nonpharmacological measures at home to help with anxiety  Discussed high dosages of medications can be contributing to daytime fatigue and unsteadiness. Continue to work with [facility] PT for hopeful improvement. Continue to follow with PCP for monitoring of current medications   ADDENDUM 09/15/2024: medication list updated per PCP med list. He is on high dose Seroquel  200mg  nightly and trazodone 100 mg nightly which could be contributing to daytime fatigue and gait changes. Consider follow up with PCP to discuss possible adjustments to medication dosages or change in therapy. Consider increasing venlafaxine to 150mg  daily to further help with anxiety - will defer to PCP.      Follow-up in 6 months or call earlier if needed - if PCP prefers to  continue to manage medications, can follow up here as needed.         DIAGNOSTIC DATA (LABS, IMAGING, TESTING) - I reviewed patient records, labs, notes, testing and imaging myself where available. MRI of the brain on December 27 2020 1.   No acute findings. 2.   T2/flair hyperintense foci in the cerebral hemispheres consistent with chronic microvascular ischemic change and small foci in the cerebellar hemispheres consistent with small lacunar infarctions.  None of the ischemic changes appears to be acute. CT abdomen on Feb 22, 2021: No abnormality seen to explain the presenting symptoms. The patient does have mild diverticulosis of the left colon but there is no CT evidence of acute diverticulitis.  Laboratory evaluation in 2022,  CMP, creatinine 1.14, calcium  mildly elevated 10.5, glucose 109, CBC, Hg 13.9, LDH 156.       HISTORICAL  Update 09/13/2024 JM: Patient returns for 28-month follow-up visit accompanied by his wife who provides majority of history.  She reports continued episodes of increased anxiety typically in the evening where he will break out into a sweat and needs to go outside to cool down.  She reports improvement of the symptoms since starting on quetiapine  by new PCP at [facility] although he was previously prescribed this. Previously followed by Dr. Norleen, last visit in August, OV note reviewed, noted quetiapine  no longer beneficial and was prescribed Rexulti  although he was unable to start due to high cost. Wife wrote down current medications which includes quetiapine , trazodone and venlafaxine managed by new PCP but unsure of current dosages. Able to view med list from [place] which notes quetiapine  200 mg nightly and trazodone 100 mg nightly, venlafaxine not listed.  No longer  on buspirone  for unclear reason.  He was prescribed Depakote  at prior visit but not currently on this either for unclear reason.  He was just seen by PCP last week, no medication changes at that  time per wife.  She feels cognition overall has been stable since prior visit.  MMSE 16/30, previously 16/30. She does report increased fatigue and unsteadiness with ambulation over the past several months, thankfully no recent falls, he is currently working with home health PT twice weekly, he also has a caregiver who assists with ADLs.  Notes he sleeps well at night but does has fatigue during the day.      UPDATE March 01 2024 Dr. Onita: He lives at home with his wife, has caregiver comes in 4 hours each day for 5 days, he continues to have frequent episode of breakout into sweat, this will make him very anxious, pacing back-and-forth in the room, outside of his house, he has difficulty sleeping because of that, taking quetiapine  50 mg as needed during the day, 2 tablets at nighttime, even that, he can only sleep 3 to 4 hours, then woke up again, had to go out to the porch,  He can put on his clothes, feeding himself, no incontinence,  UPDATE Oct 29 2021 Dr. Onita: Is accompanied by his wife at today's visit, there were some medication changes recently, overall he is much better, less agitated, no longer has nighttime sweaty spells, he can sleep through the night with medications, but wife is very confused about the medications, and reported patient tends to sleepy during the day, especially early morning time, then some improvement in the afternoon  MoCA examination 17/30 today, Seroquel  50 mg 3 times a day was given by his primary care physician Dr. Elsie Richards on October 02, 2021, wife stated he is not taking it, Buspirone   instead of taking 5 mg 3 times a day, he only takes twice a day, Zoloft  50 mg every morning, wife also reported patient have generalized weakness, loss of stamina,  Laboratory evaluation November 2022, normal vitamin D , TSH, CMP, negative acetylcholine receptor antibody, hemoglobin of 12.3, A1c of 5.3  UPDATE March 28 2021 Dr. Onita: He is accompanied by his wife at today's  visit, reported increased anxiety, weakness, more difficulty sleeping, he gets up in the middle of the sleep every night after few hours of sleep, complains of feeling hot, has to go out to cool down, only when he felt so cold, he came back from outside, then had to go out again with the same complaint,  He has stopped Zoloft , and Seroquel , it is too much anxiety medications.  Instead he relies on Xanax  0.5 mg up to 4 tablets each day, prescription was from his primary care physician, wife stated that he is hooked up to this medications, lives on it.   We personally reviewed MRI wo on December 28 2020,  No acute findings. microvascular ischemic change and small foci in the cerebellar hemispheres consistent with small lacunar infarctions.  None of the ischemic changes appears to be acute.  Laboratory evaluations, CMP showed elevated creatinine 1.14, glucose of 109, calcium  mildly elevated 10.5, CBC, hemoglobin of 13.9    Consult visit 12/20/2020 Dr. Onita: SHARBEL SAHAGUN, is a 83 year old male seen in request by her primary care physician Dr. Richards Elsie for evaluation of memory loss, initial evaluation was on December 20, 2020.  He is accompanied by his wife at today's clinical visit.  I reviewed and summarized the  referring note.PMHX. HTN Anxiety, zoloft  since 2020, seroquel  50mg  qhs, xanax  0.5mg    He is a retired chartered certified accountant, denied family history of memory loss, he was diagnosed with chronic lymphocytic leukemia, CAT scan 2018 showed small hypermetabolic node in left thorax, received chemotherapy, finished treatment since 2021, was under the care of oncologist Dr. Onesimo, most recent visit was on June 22, 2020 he was treated with venetoclax  plus obinutuzumab  from May to Sept 2020.  Since he finished chemotherapy, around the beginning of 2021, was noted to have early morning excessive sweat, anxiety, felt burning hot, he often has to get up from bed, go outside,  with no shoes on, after few  minutes, then he felt cold, has to come in, but could not stay in for too long because of burning hot, sweaty, he has to go outside again, this usually happens 3-4 AM, he will often get in and out of house multiple times, and gradually settle down around 9 AM,  He was given Xanax , which is somewhat helpful, but only takes occasion, if he took excessive amount of Xanax , become very sleepy  He can fall to sleep okay after taking Seroquel  50 mg every night, wife also reported that he become very sedentary since treatment, he cannot get his mind off something if he begin to focus on that     REVIEW OF SYSTEMS: Full 14 system review of systems performed and notable only for as above All other review of systems were negative.  PHYSICAL EXAM   Vitals:   09/13/24 1434  BP: 128/68  Pulse: 68  Weight: 145 lb (65.8 kg)  Height: 5' 11 (1.803 m)   Body mass index is 20.22 kg/m.  PHYSICAL EXAMNIATION:  Gen: NAD, conversant, well nourised, well groomed       NEUROLOGICAL EXAM:  MENTAL STATUS: Cooperative on examination, rely on his wife to provide history,     09/13/2024    2:46 PM 03/01/2024    9:58 AM 12/20/2020    2:07 PM  MMSE - Mini Mental State Exam  Orientation to time 1 1 5   Orientation to Place 4 4 5   Registration 3 3 2   Attention/ Calculation 0 2 5  Recall 2 1 3   Language- name 2 objects 2 2 2   Language- repeat 0 0 1  Language- follow 3 step command 3 2 3   Language- read & follow direction 1 1 1   Write a sentence 0 0 1  Copy design 0 0 0  Total score 16 16 28     CRANIAL NERVES: CN II: Visual fields are full to confrontation. Pupils are round equal and briskly reactive to light. CN III, IV, VI: extraocular movement are normal. No ptosis. CN V: Facial sensation is intact to light touch CN VII: Face is symmetric with normal eye closure  CN VIII: Hearing is normal to causal conversation. CN IX, X: Phonation is normal. CN XI: Head turning and shoulder shrug are  intact  MOTOR: There is no pronator drift of out-stretched arms. Muscle bulk and tone are normal. Muscle strength is normal.  REFLEXES: Reflexes are 1 and symmetric at the biceps, triceps, knees, and ankles. Plantar responses are flexor.  SENSORY: Intact to light touch, pinprick and vibratory sensation are intact in fingers and toes.  COORDINATION: There is no trunk or limb dysmetria noted.  GAIT/STANCE: Seated in w/c, no AD at visit, gait deferred   ALLERGIES: Allergies  Allergen Reactions   Penicillins Shortness Of Breath, Swelling and Other (See  Comments)    Unknown allergic reaction as a teenager   Ppd [Tuberculin Purified Protein Derivative]     Positive test in 2004    HOME MEDICATIONS: Current Outpatient Medications  Medication Sig Dispense Refill   aspirin  EC 81 MG tablet Take 81 mg by mouth daily.     Cholecalciferol  (VITAMIN D3) 5000 units TABS Take 5,000 Units by mouth daily.     QUEtiapine  (SEROQUEL ) 50 MG tablet Take 1 tablet (50 mg total) by mouth 2 (two) times daily. 180 tablet 3   rosuvastatin  (CRESTOR ) 20 MG tablet TAKE 1 TABLET EVERY DAY FOR CHOLESTEROL 90 tablet 10   hydrocortisone  (ANUSOL -HC) 2.5 % rectal cream Place 1 Application rectally 2 (two) times daily. (Patient not taking: Reported on 09/13/2024) 30 g 0   lipase/protease/amylase (CREON ) 12000-38000 units CPEP capsule Take 12,000 Units by mouth 3 (three) times daily with meals. (Patient not taking: Reported on 09/13/2024)     losartan  (COZAAR ) 100 MG tablet Take 1 tablet (100 mg total) by mouth daily. 90 tablet 0   No current facility-administered medications for this visit.    PAST MEDICAL HISTORY: Past Medical History:  Diagnosis Date   Diverticulosis    Hyperlipidemia    Hypertension    Memory loss    Prediabetes    Vitamin D  deficiency     PAST SURGICAL HISTORY: Past Surgical History:  Procedure Laterality Date   NO PAST SURGERIES      FAMILY HISTORY: Family History  Problem  Relation Age of Onset   Stroke Mother    Early death Father        Farm/tractor accident   Diabetes Brother    Cirrhosis Brother    Colon cancer Paternal Grandfather     SOCIAL HISTORY: Social History   Socioeconomic History   Marital status: Married    Spouse name: [name]   Number of children: 5   Years of education: 9th grade   Highest education level: Not on file  Occupational History   Occupation: retired  Tobacco Use   Smoking status: Former    Current packs/day: 0.00    Types: Cigarettes    Quit date: 01/17/2003    Years since quitting: 21.6   Smokeless tobacco: Never  Vaping Use   Vaping status: Never Used  Substance and Sexual Activity   Alcohol use: Not Currently    Comment: very rare   Drug use: No   Sexual activity: Not Currently  Other Topics Concern   Not on file  Social History Narrative   Lives at home with his wife.   Right-handed.   At most, one cup caffeine per day.    Patient is retired.    Social Drivers of Health   Tobacco Use: Medium Risk (09/13/2024)   Patient History    Smoking Tobacco Use: Former    Smokeless Tobacco Use: Never    Passive Exposure: Not on file  Financial Resource Strain: Low Risk (05/05/2024)   Overall Financial Resource Strain (CARDIA)    Difficulty of Paying Living Expenses: Not hard at all  Food Insecurity: No Food Insecurity (05/05/2024)   Epic    Worried About Programme Researcher, Broadcasting/film/video in the Last Year: Never true    Ran Out of Food in the Last Year: Never true  Transportation Needs: No Transportation Needs (05/05/2024)   Epic    Lack of Transportation (Medical): No    Lack of Transportation (Non-Medical): No  Physical Activity: Inactive (05/05/2024)   Exercise Vital Sign  Days of Exercise per Week: 0 days    Minutes of Exercise per Session: 0 min  Stress: Stress Concern Present (05/05/2024)   [employer] of Occupational Health - Occupational Stress Questionnaire    Feeling of Stress: Very much  Social  Connections: Moderately Integrated (05/05/2024)   Social Connection and Isolation Panel    Frequency of Communication with Friends and Family: Never    Frequency of Social Gatherings with Friends and Family: Never    Attends Religious Services: More than 4 times per year    Active Member of Clubs or Organizations: Yes    Attends Banker Meetings: Never    Marital Status: Married  Catering Manager Violence: Not At Risk (05/05/2024)   Epic    Fear of Current or Ex-Partner: No    Emotionally Abused: No    Physically Abused: No    Sexually Abused: No  Depression (PHQ2-9): Medium Risk (05/05/2024)   Depression (PHQ2-9)    PHQ-2 Score: 5  Alcohol Screen: Low Risk (05/05/2024)   Alcohol Screen    Last Alcohol Screening Score (AUDIT): 0  Housing: Unknown (05/05/2024)   Epic    Unable to Pay for Housing in the Last Year: No    Number of Times Moved in the Last Year: Not on file    Homeless in the Last Year: No  Utilities: Not At Risk (05/05/2024)   Epic    Threatened with loss of utilities: No  Health Literacy: Adequate Health Literacy (05/05/2024)   B1300 Health Literacy    Frequency of need for help with medical instructions: Rarely      I personally spent a total of 40 minutes in the care of the patient today including preparing to see the patient, getting/reviewing separately obtained history, performing a medically appropriate exam/evaluation, counseling and educating, referring and communicating with other health care professionals, and documenting clinical information in the EHR. This is our first time meeting and time has been spent reviewing past medical history and relevant medical records.   Harlene Bogaert, AGNP-BC  [facility] [address] [place], [place] [address]  Phone [phone] Fax [phone] Note: This document was prepared with digital dictation and possible smart phrase technology. Any transcriptional errors that result from  this process are unintentional.

## 2024-09-13 NOTE — Patient Instructions (Addendum)
 Your Plan:  Continue venlafaxine - please call when you get home to update on current dosage. We can consider further increasing for further help with behaviors   Continue Seroquel  and trazodone at current dosages   I will request records from your primary doctors office to ensure we have the correct dosages of medications     Follow up in 6 months or call earlier if needed       Thank you for coming to see us  at [facility]. I hope we have been able to provide you high quality care today.  You may receive a patient satisfaction survey over the next few weeks. We would appreciate your feedback and comments so that we may continue to improve ourselves and the health of our patients.

## 2024-09-14 ENCOUNTER — Other Ambulatory Visit (HOSPITAL_COMMUNITY): Payer: Self-pay

## 2024-09-15 ENCOUNTER — Telehealth: Payer: Self-pay | Admitting: Adult Health

## 2024-09-15 NOTE — Telephone Encounter (Signed)
 During office visit Harlene, NP instructed to call back with medication information. Ms. Hackman called back with information Quetiapine  a 100mg  and Trazodone 100 mg.

## 2024-09-15 NOTE — Addendum Note (Signed)
 Addended by: WHITFIELD RAISIN L on: 09/15/2024 10:46 AM   Modules accepted: Orders

## 2024-09-19 ENCOUNTER — Other Ambulatory Visit: Payer: Self-pay

## 2024-09-19 NOTE — Telephone Encounter (Signed)
 Wife has been notified of NP recommendations. She gives him   Quetiapine  100mg  (not 200) and Trazodone 100 mg.

## 2024-09-19 NOTE — Telephone Encounter (Signed)
 FYI

## 2024-10-06 ENCOUNTER — Telehealth: Payer: Self-pay | Admitting: Adult Health

## 2024-10-06 ENCOUNTER — Other Ambulatory Visit: Payer: Self-pay | Admitting: *Deleted

## 2024-10-06 NOTE — Telephone Encounter (Signed)
 Received letter from PCP Sydelle Fritz, NP at [facility] after an office visit on 09/30/2024 with medication reconciliation.  Reports he has continued on Seroquel  100 mg nightly and venlafaxine  75 mg daily.  She reports she is no longer on trazodone  and also trialing him off tamsulosin  to see if this helps with weakness and recurrent falls.  She also noted ongoing headaches therefore proceeding with CT head for further evaluation.  Of note, patient was not complaining of headaches at our recent visit.  He has also been experiencing recent stomach pain therefore he and wife declined interest in adjusting venlafaxine  dosage at that time.  Patient was started on venlafaxine  at some point between 02/2024 and 08/2024 from outside office.  At prior visit with Dr. Onita, she recommended trial of Depakote  but was never started for unclear reason.  If behaviors and anxiety persist, can consider initiating Depakote  which can also help with headaches but would prefer to hold off on initiating any new medications until further stomach complaints evaluated. Per PCP note, he has been taking Tylenol  constantly per wife, OTC analgesics should be limited to no more than 2-3 times per week or 8 times per month to reduce risk of rebound headache.  We also have to be careful on initiating/adding too many medications that may contribute to further fatigue and falls.  He may benefit from establishing care with geriatric psychiatrist for further assistance but will defer this to PCP. Will request patient be scheduled for follow up with Dr. Onita in 4 months for further input/recommendations.

## 2024-10-09 ENCOUNTER — Emergency Department (HOSPITAL_COMMUNITY)

## 2024-10-09 ENCOUNTER — Inpatient Hospital Stay (HOSPITAL_COMMUNITY)
Admission: EM | Admit: 2024-10-09 | Discharge: 2024-10-17 | DRG: 391 | Disposition: A | Discharge status: Skilled Nursing Facility | Attending: Internal Medicine | Admitting: Internal Medicine

## 2024-10-09 ENCOUNTER — Other Ambulatory Visit: Payer: Self-pay

## 2024-10-09 DIAGNOSIS — E870 Hyperosmolality and hypernatremia: Secondary | ICD-10-CM | POA: Diagnosis not present

## 2024-10-09 DIAGNOSIS — E860 Dehydration: Secondary | ICD-10-CM | POA: Diagnosis present

## 2024-10-09 DIAGNOSIS — K5900 Constipation, unspecified: Secondary | ICD-10-CM | POA: Insufficient documentation

## 2024-10-09 DIAGNOSIS — R64 Cachexia: Secondary | ICD-10-CM | POA: Diagnosis present

## 2024-10-09 DIAGNOSIS — Z800 Family history of malignant neoplasm of digestive organs: Secondary | ICD-10-CM

## 2024-10-09 DIAGNOSIS — Z79899 Other long term (current) drug therapy: Secondary | ICD-10-CM

## 2024-10-09 DIAGNOSIS — E861 Hypovolemia: Secondary | ICD-10-CM | POA: Diagnosis present

## 2024-10-09 DIAGNOSIS — R627 Adult failure to thrive: Secondary | ICD-10-CM | POA: Diagnosis present

## 2024-10-09 DIAGNOSIS — I9589 Other hypotension: Secondary | ICD-10-CM | POA: Diagnosis present

## 2024-10-09 DIAGNOSIS — E785 Hyperlipidemia, unspecified: Secondary | ICD-10-CM | POA: Diagnosis present

## 2024-10-09 DIAGNOSIS — C9111 Chronic lymphocytic leukemia of B-cell type in remission: Secondary | ICD-10-CM | POA: Diagnosis present

## 2024-10-09 DIAGNOSIS — Z833 Family history of diabetes mellitus: Secondary | ICD-10-CM

## 2024-10-09 DIAGNOSIS — R051 Acute cough: Secondary | ICD-10-CM | POA: Diagnosis not present

## 2024-10-09 DIAGNOSIS — F0150 Vascular dementia without behavioral disturbance: Secondary | ICD-10-CM | POA: Diagnosis present

## 2024-10-09 DIAGNOSIS — N179 Acute kidney failure, unspecified: Secondary | ICD-10-CM | POA: Insufficient documentation

## 2024-10-09 DIAGNOSIS — Z515 Encounter for palliative care: Secondary | ICD-10-CM

## 2024-10-09 DIAGNOSIS — Z7982 Long term (current) use of aspirin: Secondary | ICD-10-CM

## 2024-10-09 DIAGNOSIS — Z66 Do not resuscitate: Secondary | ICD-10-CM | POA: Diagnosis not present

## 2024-10-09 DIAGNOSIS — Z6821 Body mass index (BMI) 21.0-21.9, adult: Secondary | ICD-10-CM

## 2024-10-09 DIAGNOSIS — Z888 Allergy status to other drugs, medicaments and biological substances status: Secondary | ICD-10-CM

## 2024-10-09 DIAGNOSIS — I10 Essential (primary) hypertension: Secondary | ICD-10-CM | POA: Diagnosis present

## 2024-10-09 DIAGNOSIS — N170 Acute kidney failure with tubular necrosis: Secondary | ICD-10-CM | POA: Diagnosis present

## 2024-10-09 DIAGNOSIS — I700 Atherosclerosis of aorta: Secondary | ICD-10-CM | POA: Diagnosis present

## 2024-10-09 DIAGNOSIS — Z880 Allergy status to penicillin: Secondary | ICD-10-CM

## 2024-10-09 DIAGNOSIS — K5289 Other specified noninfective gastroenteritis and colitis: Principal | ICD-10-CM | POA: Diagnosis present

## 2024-10-09 DIAGNOSIS — E43 Unspecified severe protein-calorie malnutrition: Secondary | ICD-10-CM | POA: Diagnosis present

## 2024-10-09 DIAGNOSIS — Z87891 Personal history of nicotine dependence: Secondary | ICD-10-CM

## 2024-10-09 DIAGNOSIS — E162 Hypoglycemia, unspecified: Secondary | ICD-10-CM | POA: Diagnosis not present

## 2024-10-09 DIAGNOSIS — Z823 Family history of stroke: Secondary | ICD-10-CM

## 2024-10-09 DIAGNOSIS — Z1152 Encounter for screening for COVID-19: Secondary | ICD-10-CM

## 2024-10-09 DIAGNOSIS — A419 Sepsis, unspecified organism: Principal | ICD-10-CM

## 2024-10-09 DIAGNOSIS — E872 Acidosis, unspecified: Secondary | ICD-10-CM | POA: Diagnosis present

## 2024-10-09 DIAGNOSIS — K5641 Fecal impaction: Secondary | ICD-10-CM | POA: Diagnosis present

## 2024-10-09 LAB — URINALYSIS, W/ REFLEX TO CULTURE (INFECTION SUSPECTED)
Bilirubin Urine: NEGATIVE
Glucose, UA: NEGATIVE mg/dL
Ketones, ur: NEGATIVE mg/dL
Leukocytes,Ua: NEGATIVE
Nitrite: NEGATIVE
Protein, ur: 100 mg/dL — AB
Specific Gravity, Urine: 1.028 (ref 1.005–1.030)
pH: 5 (ref 5.0–8.0)

## 2024-10-09 LAB — I-STAT CG4 LACTIC ACID, ED
Lactic Acid, Venous: 5.3 mmol/L (ref 0.5–1.9)
Lactic Acid, Venous: 5.6 mmol/L (ref 0.5–1.9)
Lactic Acid, Venous: 4.3 mmol/L (ref 0.5–1.9)

## 2024-10-09 LAB — CBC WITH DIFFERENTIAL/PLATELET
Abs Immature Granulocytes: 0.02 K/uL (ref 0.00–0.07)
Basophils Absolute: 0 K/uL (ref 0.0–0.1)
Basophils Relative: 0 %
Eosinophils Absolute: 0 K/uL (ref 0.0–0.5)
Eosinophils Relative: 0 %
HCT: 33.2 % — ABNORMAL LOW (ref 39.0–52.0)
Hemoglobin: 10.7 g/dL — ABNORMAL LOW (ref 13.0–17.0)
Immature Granulocytes: 0 %
Lymphocytes Relative: 15 %
Lymphs Abs: 0.9 K/uL (ref 0.7–4.0)
MCH: 30.8 pg (ref 26.0–34.0)
MCHC: 32.2 g/dL (ref 30.0–36.0)
MCV: 95.7 fL (ref 80.0–100.0)
Monocytes Absolute: 0.1 K/uL (ref 0.1–1.0)
Monocytes Relative: 1 %
Neutro Abs: 4.7 K/uL (ref 1.7–7.7)
Neutrophils Relative %: 84 %
Platelets: 153 K/uL (ref 150–400)
RBC: 3.47 MIL/uL — ABNORMAL LOW (ref 4.22–5.81)
RDW: 14.4 % (ref 11.5–15.5)
WBC: 5.7 K/uL (ref 4.0–10.5)
nRBC: 0 % (ref 0.0–0.2)

## 2024-10-09 LAB — COMPREHENSIVE METABOLIC PANEL WITH GFR
ALT: 33 U/L (ref 0–44)
AST: 32 U/L (ref 15–41)
Albumin: 3.9 g/dL (ref 3.5–5.0)
Alkaline Phosphatase: 55 U/L (ref 38–126)
Anion gap: 17 — ABNORMAL HIGH (ref 5–15)
BUN: 19 mg/dL (ref 8–23)
CO2: 19 mmol/L — ABNORMAL LOW (ref 22–32)
Calcium: 9.7 mg/dL (ref 8.9–10.3)
Chloride: 106 mmol/L (ref 98–111)
Creatinine, Ser: 1.54 mg/dL — ABNORMAL HIGH (ref 0.61–1.24)
GFR, Estimated: 44 mL/min — ABNORMAL LOW
Glucose, Bld: 201 mg/dL — ABNORMAL HIGH (ref 70–99)
Potassium: 3.7 mmol/L (ref 3.5–5.1)
Sodium: 142 mmol/L (ref 135–145)
Total Bilirubin: 1.2 mg/dL (ref 0.0–1.2)
Total Protein: 6.2 g/dL — ABNORMAL LOW (ref 6.5–8.1)

## 2024-10-09 LAB — MAGNESIUM: Magnesium: 3.7 mg/dL — ABNORMAL HIGH (ref 1.7–2.4)

## 2024-10-09 LAB — C DIFFICILE QUICK SCREEN W PCR REFLEX
C Diff antigen: NEGATIVE
C Diff interpretation: NOT DETECTED
C Diff toxin: NEGATIVE

## 2024-10-09 LAB — PROTIME-INR
INR: 1.1 (ref 0.8–1.2)
Prothrombin Time: 15.2 s (ref 11.4–15.2)

## 2024-10-09 MED ORDER — LACTATED RINGERS IV BOLUS (SEPSIS)
1000.0000 mL | Freq: Once | INTRAVENOUS | Status: AC
  Administered 2024-10-09: 1000 mL via INTRAVENOUS

## 2024-10-09 MED ORDER — SODIUM CHLORIDE 0.9 % IV SOLN
2.0000 g | Freq: Once | INTRAVENOUS | Status: AC
  Administered 2024-10-09: 2 g via INTRAVENOUS
  Filled 2024-10-09: qty 12.5

## 2024-10-09 MED ORDER — VENLAFAXINE HCL ER 75 MG PO CP24
75.0000 mg | ORAL_CAPSULE | Freq: Every day | ORAL | Status: DC
  Administered 2024-10-10 – 2024-10-14 (×5): 75 mg via ORAL
  Filled 2024-10-09 (×5): qty 1

## 2024-10-09 MED ORDER — IOHEXOL 350 MG/ML SOLN
75.0000 mL | Freq: Once | INTRAVENOUS | Status: AC | PRN
  Administered 2024-10-09: 75 mL via INTRAVENOUS

## 2024-10-09 MED ORDER — METRONIDAZOLE 500 MG/100ML IV SOLN
500.0000 mg | Freq: Once | INTRAVENOUS | Status: AC
  Administered 2024-10-09: 500 mg via INTRAVENOUS
  Filled 2024-10-09: qty 100

## 2024-10-09 MED ORDER — POLYETHYLENE GLYCOL 3350 17 G PO PACK
17.0000 g | PACK | Freq: Every day | ORAL | Status: DC
  Filled 2024-10-09: qty 1

## 2024-10-09 MED ORDER — ASPIRIN 81 MG PO TBEC
81.0000 mg | DELAYED_RELEASE_TABLET | Freq: Every day | ORAL | Status: DC
  Administered 2024-10-09 – 2024-10-17 (×9): 81 mg via ORAL
  Filled 2024-10-09 (×9): qty 1

## 2024-10-09 MED ORDER — ENOXAPARIN SODIUM 40 MG/0.4ML IJ SOSY
40.0000 mg | PREFILLED_SYRINGE | INTRAMUSCULAR | Status: DC
  Administered 2024-10-09 – 2024-10-10 (×2): 40 mg via SUBCUTANEOUS
  Filled 2024-10-09 (×2): qty 0.4

## 2024-10-09 MED ORDER — QUETIAPINE FUMARATE 100 MG PO TABS
100.0000 mg | ORAL_TABLET | Freq: Every day | ORAL | Status: DC
  Administered 2024-10-09: 100 mg via ORAL
  Filled 2024-10-09: qty 4

## 2024-10-09 MED ORDER — LACTATED RINGERS IV SOLN: INTRAVENOUS | Status: AC

## 2024-10-09 MED ORDER — LOSARTAN POTASSIUM 50 MG PO TABS
100.0000 mg | ORAL_TABLET | Freq: Every day | ORAL | Status: DC
  Administered 2024-10-10: 100 mg via ORAL
  Filled 2024-10-09 (×2): qty 2

## 2024-10-09 MED ORDER — DICYCLOMINE HCL 10 MG PO CAPS: 10.0000 mg | ORAL_CAPSULE | Freq: Three times a day (TID) | ORAL | Status: DC

## 2024-10-09 MED ORDER — FLEET ENEMA RE ENEM
1.0000 | ENEMA | Freq: Once | RECTAL | Status: DC
  Filled 2024-10-09: qty 1

## 2024-10-09 MED ORDER — SENNOSIDES-DOCUSATE SODIUM 8.6-50 MG PO TABS
1.0000 | ORAL_TABLET | Freq: Every day | ORAL | Status: DC
  Administered 2024-10-09 – 2024-10-15 (×6): 1 via ORAL
  Filled 2024-10-09 (×7): qty 1

## 2024-10-09 MED ORDER — MEMANTINE HCL 10 MG PO TABS: 5.0000 mg | ORAL_TABLET | Freq: Every day | ORAL | Status: DC

## 2024-10-09 MED ORDER — ROSUVASTATIN CALCIUM 20 MG PO TABS
40.0000 mg | ORAL_TABLET | Freq: Every day | ORAL | Status: DC
  Administered 2024-10-09 – 2024-10-12 (×4): 40 mg via ORAL
  Filled 2024-10-09 (×4): qty 2

## 2024-10-09 MED ORDER — LACTATED RINGERS IV BOLUS
1000.0000 mL | Freq: Once | INTRAVENOUS | Status: AC
  Administered 2024-10-09: 1000 mL via INTRAVENOUS

## 2024-10-09 NOTE — ED Notes (Signed)
 Patient was change twice today and at 1830, before third shift came on shift.

## 2024-10-09 NOTE — ED Notes (Signed)
 Pt digitally disimpacted by Dr. Doretha with a large amount of medium-hard stool evacuated followed by pt passing liquid stool.

## 2024-10-09 NOTE — ED Notes (Signed)
 Pt brief and bed was changed  and provided bed bath due to being covered in feces from head to toe.

## 2024-10-09 NOTE — ED Notes (Signed)
 Report was given at bedside by [facility] today. CCMD was call at bedside to transfer the patient on the monitor in yellow.

## 2024-10-09 NOTE — ED Provider Notes (Signed)
 " [place] [facility] DE[facility]MENT AT [facility] Provider Note   CSN: [id number] Arrival date & time: 10/09/24  9191     Patient presents with: Code Sepsis   Javier [name] is a 84 y.o. male.   Patient is an 84 year old male with a history of hypertension, hyperlipidemia, dementia who lives at home with his wife presenting today with EMS due to change in mental status and fecal incontinence.  Patient's wife reports that earlier this week they went to the doctor and he was found to have some microscopic blood in his urine however he started having multiple episodes of diarrhea yesterday and then this morning his wife found him in bed incontinent of stool and altered beyond his baseline.  Patient was not able to get up.  Yesterday patient was walking back and forth to the bathroom without difficulty.  Based on prior medical records he was seen by his doctor on 2 January and at that time there were some psychiatric medications that were adjusted but his wife had also reported he been complaining of some stomach pain.  At this time that is all the information available as family is not present yet.  EMS reports patient has been hypotensive and they were unable to get a line.  Blood sugar was normal.  The history is provided by the EMS personnel, medical records and the spouse. The history is limited by the absence of a caregiver.       Prior to Admission medications  Medication Sig Start Date End Date Taking? Authorizing Provider  albuterol (VENTOLIN HFA) 108 (90 Base) MCG/ACT inhaler Inhale 2 puffs into the lungs every 4 (four) hours as needed for wheezing or shortness of breath.    [provider]  aspirin  EC 81 MG tablet Take 81 mg by mouth daily.    [provider]  Cholecalciferol  (VITAMIN D3) 5000 units TABS Take 5,000 Units by mouth daily.    [provider]  dicyclomine  (BENTYL ) 10 MG capsule Take 10 mg by mouth 3 (three) times daily with meals.     [provider]  lidocaine  4 % Place 1 patch onto the skin daily as needed (pain).    [provider]  losartan  (COZAAR ) 100 MG tablet Take 1 tablet (100 mg total) by mouth daily. 09/23/22   Cranford, Tonya, NP  QUEtiapine  (SEROQUEL ) 100 MG tablet Take 100 mg by mouth at bedtime.    [provider]  rosuvastatin  (CRESTOR ) 20 MG tablet TAKE 1 TABLET EVERY DAY FOR CHOLESTEROL 07/14/22   Cranford, Tonya, NP  venlafaxine  XR (EFFEXOR -XR) 75 MG 24 hr capsule Take 75 mg by mouth daily with breakfast.    [provider]    Allergies: Penicillins and Ppd [tuberculin purified protein derivative]    Review of Systems  Updated Vital Signs BP (!) 108/56   Pulse 86   Temp 97.9 F (36.6 C) (Oral)   Resp (!) 26   SpO2 100%   Physical Exam Vitals and nursing note reviewed.  Constitutional:      General: He is not in acute distress.    Appearance: He is well-developed. He is ill-appearing.     Comments: Lethargic, covered in dried stool  HENT:     Head: Normocephalic and atraumatic.     Mouth/Throat:     Mouth: Mucous membranes are dry.  Eyes:     Conjunctiva/sclera: Conjunctivae normal.     Pupils: Pupils are equal, round, and reactive to light.  Cardiovascular:  Rate and Rhythm: Normal rate and regular rhythm.     Heart sounds: No murmur heard.    Comments: Pulses only palpated in the carotids Pulmonary:     Effort: Pulmonary effort is normal. No respiratory distress.     Breath sounds: Normal breath sounds. No wheezing or rales.  Abdominal:     General: There is no distension.     Palpations: Abdomen is soft.     Tenderness: There is abdominal tenderness. There is no guarding or rebound.     Comments: Seems to have diffuse tenderness with palpation  Musculoskeletal:        General: No tenderness. Normal range of motion.     Cervical back: Normal range of motion and neck supple.     Right lower leg: No edema.     Left lower leg: No edema.   Skin:    General: Skin is warm and dry.     Coloration: Skin is pale.     Findings: No erythema or rash.  Neurological:     Comments: Lethargic and will only mumble when asked questions     (all labs ordered are listed, but only abnormal results are displayed) Labs Reviewed  COMPREHENSIVE METABOLIC PANEL WITH GFR - Abnormal; Notable for the following components:      Result Value   CO2 19 (*)    Glucose, Bld 201 (*)    Creatinine, Ser 1.54 (*)    Total Protein 6.2 (*)    GFR, Estimated 44 (*)    Anion gap 17 (*)    All other components within normal limits  CBC WITH DIFFERENTIAL/PLATELET - Abnormal; Notable for the following components:   RBC 3.47 (*)    Hemoglobin 10.7 (*)    HCT 33.2 (*)    All other components within normal limits  URINALYSIS, W/ REFLEX TO CULTURE (INFECTION SUSPECTED) - Abnormal; Notable for the following components:   Color, Urine AMBER (*)    APPearance CLOUDY (*)    Hgb urine dipstick MODERATE (*)    Protein, ur 100 (*)    Bacteria, UA RARE (*)    All other components within normal limits  MAGNESIUM - Abnormal; Notable for the following components:   Magnesium 3.7 (*)    All other components within normal limits  I-STAT CG4 LACTIC ACID, ED - Abnormal; Notable for the following components:   Lactic Acid, Venous 5.3 (*)    All other components within normal limits  I-STAT CG4 LACTIC ACID, ED - Abnormal; Notable for the following components:   Lactic Acid, Venous 5.6 (*)    All other components within normal limits  I-STAT CG4 LACTIC ACID, ED - Abnormal; Notable for the following components:   Lactic Acid, Venous 4.3 (*)    All other components within normal limits  C DIFFICILE QUICK SCREEN W PCR REFLEX    RESP PANEL BY RT-PCR (RSV, FLU A&B, COVID)  RVPGX2  CULTURE, BLOOD (ROUTINE X 2)  CULTURE, BLOOD (ROUTINE X 2)  PROTIME-INR  I-STAT CG4 LACTIC ACID, ED    EKG: EKG Interpretation Date/Time:  Sunday October 09 2024 08:21:23  EST Ventricular Rate:  64 PR Interval:  184 QRS Duration:  91 QT Interval:  445 QTC Calculation: 460 R Axis:   59  Text Interpretation: Sinus rhythm Ventricular trigeminy Abnormal R-wave progression, early transition No significant change since last tracing Confirmed by Doretha Folks ([id number]) on 10/09/2024 8:27:34 AM  Radiology: CT ABDOMEN PELVIS W CONTRAST Result Date: 10/09/2024 CLINICAL DATA:  Diarrhea and sepsis. EXAM: CT ABDOMEN AND PELVIS WITH CONTRAST TECHNIQUE: Multidetector CT imaging of the abdomen and pelvis was performed using the standard protocol following bolus administration of intravenous contrast. RADIATION DOSE REDUCTION: This exam was performed according to the departmental dose-optimization program which includes automated exposure control, adjustment of the mA and/or kV according to patient size and/or use of iterative reconstruction technique. CONTRAST:  75mL OMNIPAQUE  IOHEXOL  350 MG/ML SOLN COMPARISON:  05/22/2024 FINDINGS: Lower chest: Mild atelectasis at the posterior right lung base. Small amount of fluid in the visualized distal esophagus. Hepatobiliary: Normal appearance of liver without lesion. Mild gallbladder distension without visible gallstones or inflammation. Pancreas: Unremarkable. No pancreatic ductal dilatation or surrounding inflammatory changes. Spleen: Normal in size without focal abnormality. Adrenals/Urinary Tract: Adrenal glands are unremarkable. Kidneys are normal, without renal calculi, focal lesion, or hydronephrosis. Bladder is decompressed by Foley catheter. Stomach/Bowel: Diffusely abnormal colon demonstrating thickening, inflammation and both fluid and large amount of fecal material including a very large stool ball in the rectum measuring up to approximately 9.2 x 8.6 x 12 cm. Associated free fluid in the abdomen and pelvis. Multiple small bowel loops demonstrate hyperemia and are fluid-filled. Findings are likely reflective of severe stercoral  colitis. No definite free intraperitoneal air, pneumatosis or focal abscess identified. Vascular/Lymphatic: Atherosclerosis of abdominal aorta without aneurysm. No lymphadenopathy identified. Reproductive: Prostate is unremarkable. Other: No abdominal wall hernia. Musculoskeletal: No acute or significant osseous findings. IMPRESSION: 1. Diffusely abnormal colon demonstrating thickening, inflammation and both fluid and large amount of fecal material including a very large stool ball in the rectum measuring up to approximately 9.2 x 8.6 x 12 cm. Associated free fluid in the abdomen and pelvis. Multiple small bowel loops demonstrate hyperemia and are fluid-filled. Findings are likely reflective of severe stercoral colitis. 2. Aortic atherosclerosis. Electronically Signed   By: Marcey Moan M.D.   On: 10/09/2024 13:28   DG Chest Port 1 View Result Date: 10/09/2024 EXAM: 1 VIEW(S) XRAY OF THE CHEST 10/09/2024 08:47:57 AM COMPARISON: None available. CLINICAL HISTORY: 84 year old male. Questionable systemic inflammatory response syndrome - evaluate for abnormality. FINDINGS: LUNGS AND PLEURA: Lower lung volumes. No acute pulmonary opacity. No pleural effusion. No pneumothorax. HEART AND MEDIASTINUM: No acute abnormality of the cardiac and mediastinal silhouettes. BONES AND SOFT TISSUES: No acute osseous abnormality. IMPRESSION: 1. Lower lung volumes.  No acute cardiopulmonary abnormality. Electronically signed by: Helayne Hurst MD MD 10/09/2024 09:00 AM EST RP Workstation: [id number]     Procedures   Medications Ordered in the ED  lactated ringers  infusion ( Intravenous New Bag/Given 10/09/24 1015)  sodium phosphate  (FLEET) enema 1 enema (has no administration in time range)  lactated ringers  bolus 1,000 mL (0 mLs Intravenous Stopped 10/09/24 0859)    And  lactated ringers  bolus 1,000 mL (0 mLs Intravenous Stopped 10/09/24 0906)  ceFEPIme  (MAXIPIME ) 2 g in sodium chloride  0.9 % 100 mL IVPB (0 g Intravenous  Stopped 10/09/24 0859)  metroNIDAZOLE  (FLAGYL ) IVPB 500 mg (0 mg Intravenous Stopped 10/09/24 0959)  lactated ringers  bolus 1,000 mL (0 mLs Intravenous Stopped 10/09/24 1205)  iohexol  (OMNIPAQUE ) 350 MG/ML injection 75 mL (75 mLs Intravenous Contrast Given 10/09/24 1253)                                    Medical Decision Making Amount and/or Complexity of Data Reviewed External Data Reviewed: notes. Labs: ordered. Decision-making details documented in ED Course. Radiology:  ordered and independent interpretation performed. Decision-making details documented in ED Course. ECG/medicine tests: ordered and independent interpretation performed. Decision-making details documented in ED Course.  Risk OTC drugs. Prescription drug management.   Pt with multiple medical problems and comorbidities and presenting today with a complaint that caries a high risk for morbidity and mortality.  Here today with concern for sepsis with shock versus severe dehydration, electrolyte abnormality, AKI, acute abdominal process such as colitis, diverticulitis, cholecystitis, C. difficile.  Based on notes does not appear that patient has had any antibiotic recently but will confirm with his wife.  Patient's blood pressure upon arrival here is 80s systolic.  He has not received any fluid at this time.  Also feels cold to the touch.  Sepsis order set initiated.  Pain and 2 L of fluid which is 30/kg.  Will reassess blood pressure once fluids are in to discern if patient is in need of pressors.  Low suspicion for acute cardiac or lung pathology based on patient's exam.  Bair hugger was placed.  Labs and imaging are pending.  Patient was started on recommended abdominal antibiotics cefepime  and Flagyl .  1:47 PM I independently interpreted patient's labs and EKG.  EKG without acute findings, patient's lactate was elevated at 5.3 then 5.6 and now is starting to improve at 4.3, CMP without significant findings other than a mild  lactic acidosis, CBC is within normal limits, UA without evidence of infection, C. difficile is negative.  I have independently visualized and interpreted pt's images today.  X-ray without acute findings today, CT of the abdomen pelvis ordered as after patient received fluids he started waking up and complained of abdominal pain.  He has had persistent liquid stool as well.  CT shows a large stool ball.  Radiology reports diffuse abnormal colon demonstrating thickening in both fluid and large amount of fecal material with a large stool ball in the rectum measuring 8 x12 cm and associated free fluid.  There is also multiple small bowel loops demonstrating hyperemia and fluid-filled.  This is concerning for severe stercoral colitis.  Patient did have another episode of hypotension which responded to IV fluids.  His blood pressure has now been more stable in the low 100s.  He is mentating better and overall appears improved from when he first arrived.  Manual disimpaction was done with a large amount of stool removed.  Will admit for further care.  CRITICAL CARE Performed by: Nyasha Rahilly Total critical care time: 40 minutes Critical care time was exclusive of separately billable procedures and treating other patients. Critical care was necessary to treat or prevent imminent or life-threatening deterioration. Critical care was time spent personally by me on the following activities: development of treatment plan with patient and/or surrogate as well as nursing, discussions with consultants, evaluation of patient's response to treatment, examination of patient, obtaining history from patient or surrogate, ordering and performing treatments and interventions, ordering and review of laboratory studies, ordering and review of radiographic studies, pulse oximetry and re-evaluation of patient's condition.        Final diagnoses:  Sepsis without acute organ dysfunction, due to unspecified organism (HCC)   Hypotension due to hypovolemia  Fecal impaction (HCC)  Stercoral colitis    ED Discharge Orders     None          Doretha Folks, MD 10/09/24 1347  "

## 2024-10-09 NOTE — ED Triage Notes (Signed)
 Pt BIB [facility] found in his own feces. Pt coming from home and wife states he was urinating a lot yesterday and had a recent doctor's visit where he was found to have microscopic blood in his urine. The pt has PMHx of dementia and currently only c/o pain in his butt.

## 2024-10-09 NOTE — Sepsis Progress Note (Signed)
 Notified provider of need to consider ordering a third lactic acid.

## 2024-10-09 NOTE — ED Notes (Signed)
 Blood cultures attained prior to antibiotics being given even though this is not what the time stamps reflect in the chart.

## 2024-10-09 NOTE — H&P (Signed)
 " History and Physical    Patient: Javier [name] FMW:[id number] DOB: May 27, 1941 DOA: 10/09/2024 DOS: the patient was seen and examined on 10/09/2024 PCP: [facility] [address]  Patient coming from: Home  Chief Complaint:  Chief Complaint  Patient presents with   Code Sepsis   HPI: Javier [name] is a 84 y.o. male with medical history significant of vascular dementia, CLL in remission, and HTN who p/w abd pain and found to have imaging c/f severe stercoral colitis s/p disimpaction and resolution of abd in ED.  Pt unable to provide medical history. History obtained per wife, who reports the patient experienced a significant episode of abdominal pain, which began the previous night. The pain persisted throughout the night, and the patient had frequent bowel movements that continued into the following day. Previously, the patient had a similar episode, which was resolved with MiraLAX  mixed with Gatorade, but this remedy was not tried this time. Wife denies use of daily stool softners pta. The patient had been consuming Ensure 300 and eating intermittently pta, but had been notably losing weight.   In the ED, pt hypotensive, and tachypneic. Labs notable for lactic acid 5/3>4.3, WBC 5.7 and Cr 1.54 (baseline . Clostridium difficile toxin and Ag negative. Blood cultures pending. CXR unremarkable. CT abd showed diffusely abnormal colon demonstrating thickening, inflammation and both fluid and large amount of fecal material including a very large stool ball in the rectum measuring up to approximately 9.2 x 8.6 x 12 cm. Associated free fluid in the abdomen and pelvis. Multiple small bowel loops demonstrate hyperemia and are fluid-filled. Findings are likely reflective of severe stercoral colitis. EDP started IV cefepime /flagyl , disimpacted pt with immediate relief, and requested medicine admission.   Review of Systems: As mentioned in the history of present illness. All other systems reviewed and are  negative. Past Medical History:  Diagnosis Date   Diverticulosis    Hyperlipidemia    Hypertension    Memory loss    Prediabetes    Vitamin D  deficiency    Past Surgical History:  Procedure Laterality Date   NO PAST SURGERIES     Social History:  reports that he quit smoking about 21 years ago. His smoking use included cigarettes. He has never used smokeless tobacco. He reports that he does not currently use alcohol. He reports that he does not use drugs.  Allergies[1]  Family History  Problem Relation Age of Onset   Stroke Mother    Early death Father        Farm/tractor accident   Diabetes Brother    Cirrhosis Brother    Colon cancer Paternal Grandfather     Prior to Admission medications  Medication Sig Start Date End Date Taking? Authorizing Provider  memantine  (NAMENDA ) 5 MG tablet 5 mg. 06/21/24  Yes [provider]  sulfamethoxazole-trimethoprim (BACTRIM) 400-80 MG tablet Take 1 tablet by mouth 2 (two) times daily. 09/28/24  Yes [provider]  albuterol (VENTOLIN HFA) 108 (90 Base) MCG/ACT inhaler Inhale 2 puffs into the lungs every 4 (four) hours as needed for wheezing or shortness of breath.    [provider]  aspirin  EC 81 MG tablet Take 81 mg by mouth daily.    [provider]  Cholecalciferol  (VITAMIN D3) 5000 units TABS Take 5,000 Units by mouth daily.    [provider]  dicyclomine  (BENTYL ) 10 MG capsule Take 10 mg by mouth 3 (three) times daily with meals.    [provider]  lidocaine   4 % Place 1 patch onto the skin daily as needed (pain).    [provider]  losartan  (COZAAR ) 100 MG tablet Take 1 tablet (100 mg total) by mouth daily. 09/23/22   Cranford, Tonya, NP  QUEtiapine  (SEROQUEL ) 100 MG tablet Take 100 mg by mouth at bedtime.    [provider]  rosuvastatin  (CRESTOR ) 20 MG tablet TAKE 1 TABLET EVERY DAY FOR CHOLESTEROL 07/14/22   Cranford, Tonya, NP  venlafaxine  XR (EFFEXOR -XR)  75 MG 24 hr capsule Take 75 mg by mouth daily with breakfast.    [provider]    Physical Exam: Vitals:   10/09/24 1400 10/09/24 1445 10/09/24 1510 10/09/24 1512  BP: (!) 107/52 (!) 109/48 133/62   Pulse: 84 81  86  Resp: (!) 26 (!) 26  (!) 25  Temp:      TempSrc:      SpO2: 100% 100%  100%   General: Alert, oriented x3, resting comfortably in no acute distress Respiratory: Lungs clear to auscultation bilaterally with normal respiratory effort; no w/r/r Cardiovascular: Regular rate and rhythm w/o m/r/g Abdomen: Soft, nontender, nondistended. Positive bowel sounds   Data Reviewed:  Lab Results  Component Value Date   WBC 5.7 10/09/2024   HGB 10.7 (L) 10/09/2024   HCT 33.2 (L) 10/09/2024   MCV 95.7 10/09/2024   PLT 153 10/09/2024   Lab Results  Component Value Date   GLUCOSE 201 (H) 10/09/2024   CALCIUM  9.7 10/09/2024   NA 142 10/09/2024   K 3.7 10/09/2024   CO2 19 (L) 10/09/2024   CL 106 10/09/2024   BUN 19 10/09/2024   CREATININE 1.54 (H) 10/09/2024   Lab Results  Component Value Date   ALT 33 10/09/2024   AST 32 10/09/2024   ALKPHOS 55 10/09/2024   BILITOT 1.2 10/09/2024   Lab Results  Component Value Date   INR 1.1 10/09/2024   INR 1.1 08/09/2021   INR 1.1 02/22/2021   Radiology: CT ABDOMEN PELVIS W CONTRAST Result Date: 10/09/2024 CLINICAL DATA:  Diarrhea and sepsis. EXAM: CT ABDOMEN AND PELVIS WITH CONTRAST TECHNIQUE: Multidetector CT imaging of the abdomen and pelvis was performed using the standard protocol following bolus administration of intravenous contrast. RADIATION DOSE REDUCTION: This exam was performed according to the departmental dose-optimization program which includes automated exposure control, adjustment of the mA and/or kV according to patient size and/or use of iterative reconstruction technique. CONTRAST:  75mL OMNIPAQUE  IOHEXOL  350 MG/ML SOLN COMPARISON:  05/22/2024 FINDINGS: Lower chest: Mild atelectasis at the posterior  right lung base. Small amount of fluid in the visualized distal esophagus. Hepatobiliary: Normal appearance of liver without lesion. Mild gallbladder distension without visible gallstones or inflammation. Pancreas: Unremarkable. No pancreatic ductal dilatation or surrounding inflammatory changes. Spleen: Normal in size without focal abnormality. Adrenals/Urinary Tract: Adrenal glands are unremarkable. Kidneys are normal, without renal calculi, focal lesion, or hydronephrosis. Bladder is decompressed by Foley catheter. Stomach/Bowel: Diffusely abnormal colon demonstrating thickening, inflammation and both fluid and large amount of fecal material including a very large stool ball in the rectum measuring up to approximately 9.2 x 8.6 x 12 cm. Associated free fluid in the abdomen and pelvis. Multiple small bowel loops demonstrate hyperemia and are fluid-filled. Findings are likely reflective of severe stercoral colitis. No definite free intraperitoneal air, pneumatosis or focal abscess identified. Vascular/Lymphatic: Atherosclerosis of abdominal aorta without aneurysm. No lymphadenopathy identified. Reproductive: Prostate is unremarkable. Other: No abdominal wall hernia. Musculoskeletal: No acute or significant osseous findings. IMPRESSION: 1. Diffusely  abnormal colon demonstrating thickening, inflammation and both fluid and large amount of fecal material including a very large stool ball in the rectum measuring up to approximately 9.2 x 8.6 x 12 cm. Associated free fluid in the abdomen and pelvis. Multiple small bowel loops demonstrate hyperemia and are fluid-filled. Findings are likely reflective of severe stercoral colitis. 2. Aortic atherosclerosis. Electronically Signed   By: Marcey Moan M.D.   On: 10/09/2024 13:28   DG Chest Port 1 View Result Date: 10/09/2024 EXAM: 1 VIEW(S) XRAY OF THE CHEST 10/09/2024 08:47:57 AM COMPARISON: None available. CLINICAL HISTORY: 84 year old male. Questionable systemic  inflammatory response syndrome - evaluate for abnormality. FINDINGS: LUNGS AND PLEURA: Lower lung volumes. No acute pulmonary opacity. No pleural effusion. No pneumothorax. HEART AND MEDIASTINUM: No acute abnormality of the cardiac and mediastinal silhouettes. BONES AND SOFT TISSUES: No acute osseous abnormality. IMPRESSION: 1. Lower lung volumes.  No acute cardiopulmonary abnormality. Electronically signed by: Helayne Hurst MD MD 10/09/2024 09:00 AM EST RP Workstation: [id number]    Assessment and Plan: 72M h/o vascular dementia, CLL in remission, and HTN who p/w abd pain and found to have imaging c/f severe stercoral colitis s/p disimpaction and resolution of abd in ED.  Constipation Dehydration Imaging c/f severe stercoral colitis  S/p disimpaction and large bowel movement in ED -IV LR at 150cc/h for now -Defer further abx for now -Start senna nightly and miralax  daily at discharge -D/c pta bentyl  for now as this anticholinergic may be exacerbating constipation -Encourage Pedialyte or Gatorde on discharge to prevent dehydration  HTN -PTA losartan  100mg  daily  FTT Vascular dementia -PT/OT eval; apprec eval/recs -PTA Namenda  and Seroquel    Advance Care Planning:   Code Status: Full Code   Consults: N/A  Family Communication: Wife  Severity of Illness: The appropriate patient status for this patient is OBSERVATION. Observation status is judged to be reasonable and necessary in order to provide the required intensity of service to ensure the patient's safety. The patient's presenting symptoms, physical exam findings, and initial radiographic and laboratory data in the context of their medical condition is felt to place them at decreased risk for further clinical deterioration. Furthermore, it is anticipated that the patient will be medically stable for discharge from the hospital within 2 midnights of admission.    ------- I spent 55 minutes reviewing previous notes, at the bedside  counseling/discussing the treatment plan, and performing clinical documentation.  Author: Marsha Ada, MD 10/09/2024 3:23 PM  For on call review [email or web address].     [1]  Allergies Allergen Reactions   Penicillins Other (See Comments), Shortness Of Breath, Swelling and Dermatitis    Unknown allergic reaction as a teenager   Ppd [Tuberculin Purified Protein Derivative]     Positive test in 2004   Tuberculin, Ppd Other (See Comments)    Positive test in 2004   "

## 2024-10-09 NOTE — ED Notes (Addendum)
 PT changed and resting

## 2024-10-09 NOTE — ED Notes (Signed)
 Bair hugger placed on pt.

## 2024-10-09 NOTE — ED Notes (Signed)
 Bair hugger placed for patient.

## 2024-10-09 NOTE — ED Notes (Addendum)
 Patient's wife told RN that she told the food serve person she did not want the patient dinner, because no one is going to feed him. Rn told her we will feed him, and wife decided she want the tray. A new tray was order for the patient, his wife did tell the nurse he sometimes have problem swallowing. Rn went in patient room and fed the patient applesauce with meds. He was given some turkey and mash potato, told RN he did not want any more to eat.

## 2024-10-09 NOTE — Sepsis Progress Note (Signed)
 Sepsis protocol is being followed by [facility].

## 2024-10-10 ENCOUNTER — Other Ambulatory Visit: Payer: Self-pay

## 2024-10-10 DIAGNOSIS — R627 Adult failure to thrive: Secondary | ICD-10-CM | POA: Diagnosis not present

## 2024-10-10 DIAGNOSIS — E860 Dehydration: Secondary | ICD-10-CM | POA: Diagnosis not present

## 2024-10-10 DIAGNOSIS — K5900 Constipation, unspecified: Secondary | ICD-10-CM | POA: Insufficient documentation

## 2024-10-10 DIAGNOSIS — K5909 Other constipation: Secondary | ICD-10-CM | POA: Diagnosis not present

## 2024-10-10 DIAGNOSIS — K5289 Other specified noninfective gastroenteritis and colitis: Principal | ICD-10-CM

## 2024-10-10 DIAGNOSIS — N179 Acute kidney failure, unspecified: Secondary | ICD-10-CM | POA: Diagnosis not present

## 2024-10-10 DIAGNOSIS — E872 Acidosis, unspecified: Secondary | ICD-10-CM

## 2024-10-10 LAB — BASIC METABOLIC PANEL WITH GFR
Anion gap: 15 (ref 5–15)
BUN: 30 mg/dL — ABNORMAL HIGH (ref 8–23)
CO2: 17 mmol/L — ABNORMAL LOW (ref 22–32)
Calcium: 8.9 mg/dL (ref 8.9–10.3)
Chloride: 109 mmol/L (ref 98–111)
Creatinine, Ser: 1.64 mg/dL — ABNORMAL HIGH (ref 0.61–1.24)
GFR, Estimated: 41 mL/min — ABNORMAL LOW
Glucose, Bld: 115 mg/dL — ABNORMAL HIGH (ref 70–99)
Potassium: 4.2 mmol/L (ref 3.5–5.1)
Sodium: 141 mmol/L (ref 135–145)

## 2024-10-10 LAB — CBC
HCT: 37.5 % — ABNORMAL LOW (ref 39.0–52.0)
Hemoglobin: 12.4 g/dL — ABNORMAL LOW (ref 13.0–17.0)
MCH: 30.7 pg (ref 26.0–34.0)
MCHC: 33.1 g/dL (ref 30.0–36.0)
MCV: 92.8 fL (ref 80.0–100.0)
Platelets: 157 K/uL (ref 150–400)
RBC: 4.04 MIL/uL — ABNORMAL LOW (ref 4.22–5.81)
RDW: 14.5 % (ref 11.5–15.5)
WBC: 9.6 K/uL (ref 4.0–10.5)
nRBC: 0 % (ref 0.0–0.2)

## 2024-10-10 LAB — RESP PANEL BY RT-PCR (RSV, FLU A&B, COVID)  RVPGX2
Influenza A by PCR: NEGATIVE
Influenza B by PCR: NEGATIVE
Resp Syncytial Virus by PCR: NEGATIVE
SARS Coronavirus 2 by RT PCR: NEGATIVE

## 2024-10-10 LAB — LACTIC ACID, PLASMA: Lactic Acid, Venous: 4 mmol/L (ref 0.5–1.9)

## 2024-10-10 MED ORDER — HALOPERIDOL LACTATE 5 MG/ML IJ SOLN
5.0000 mg | Freq: Once | INTRAMUSCULAR | Status: AC
  Administered 2024-10-10: 5 mg via INTRAMUSCULAR
  Filled 2024-10-10: qty 1

## 2024-10-10 MED ORDER — QUETIAPINE FUMARATE 100 MG PO TABS
100.0000 mg | ORAL_TABLET | Freq: Every day | ORAL | Status: DC
  Administered 2024-10-10 – 2024-10-12 (×3): 100 mg via ORAL
  Filled 2024-10-10 (×3): qty 1

## 2024-10-10 MED ORDER — SODIUM CHLORIDE 0.9 % IV SOLN: INTRAVENOUS | Status: DC

## 2024-10-10 MED ORDER — CHLORHEXIDINE GLUCONATE CLOTH 2 % EX PADS
6.0000 | MEDICATED_PAD | Freq: Every day | CUTANEOUS | Status: DC
  Administered 2024-10-10 – 2024-10-16 (×7): 6 via TOPICAL

## 2024-10-10 MED ORDER — MORPHINE SULFATE (PF) 2 MG/ML IV SOLN
2.0000 mg | INTRAVENOUS | Status: DC | PRN
  Administered 2024-10-13 – 2024-10-15 (×2): 2 mg via INTRAVENOUS
  Filled 2024-10-10 (×2): qty 1

## 2024-10-10 NOTE — Progress Notes (Signed)
 " Progress Note   Patient: Javier [name] FMW:[id number] DOB: [birth year] DOA: 10/09/2024     1 DOS: the patient was seen and examined on 10/10/2024   Brief hospital course: Javier [name] is a 84 y.o. male with medical history significant of vascular dementia, CLL in remission, and HTN who p/w abd pain and found to have imaging c/f severe stercoral colitis s/p disimpaction and resolution of abdominal discomfort and in ED admitted for further management evaluation of dehydration, constipation, lactic acidosis, AKI.  Assessment and Plan: Severe constipation Status post manual disimpaction Stercoral colitis- Continue gentle IV fluids. Continue aggressive bowel regimen. Encourage oral diet, supplements. No need for further antibiotics at this time.  AKI: Due to dehydration. Gentle IV fluids. Hold ARB at this time. Monitor daily renal function.  Lactic acidosis- Likely due to severe colon inflammation due to constipation. Abdomen benign. No signs of sepsis. Continue IV hydration, monitor Lactic acid level.  Hypertension: Hold losartan  at this time due to AKI, borderline low BP. Monitor vitals closely.  Failure to thrive Vascular dementia- PT OT recommended skilled nursing facility placement. Wife wishes to continue venlafaxine , Seroquel  which helped him. Wife agrees with SNF placement.       Out of bed to chair. Incentive spirometry. Nursing supportive care. Fall, aspiration precautions. Diet:  Diet Orders (From admission, onward)     Start     Ordered   10/10/24 0922  DIET DYS 3 Room service appropriate? Yes; Fluid consistency: Thin  Diet effective now       Question Answer Comment  Room service appropriate? Yes   Fluid consistency: Thin      10/10/24 0921           DVT prophylaxis: enoxaparin  (LOVENOX ) injection 40 mg Start: 10/09/24 1600  Level of care: Med-Surg   Code Status: Full Code  Subjective: Patient is seen and examined today morning.  He is  uncomfortable, trying to get out of bed while with physical therapy.  RN noted Foley discomfort, adjusted.  I did go to his room this afternoon, resting comfortably.  Discussed with wife over phone.  Physical Exam: Vitals:   10/10/24 0500 10/10/24 0600 10/10/24 0636 10/10/24 1141  BP: 114/70 129/62 138/66 (!) 112/56  Pulse:  81 85 88  Resp:  (!) 25 (!) 24 20  Temp: 97.8 F (36.6 C)  98.2 F (36.8 C) 98.6 F (37 C)  TempSrc: Axillary  Axillary Axillary  SpO2:  100% 100% 92%    General - Elderly thin built African-American male, restless due to Foley discomfort HEENT - PERRLA, EOMI, atraumatic head, non tender sinuses. Lung - Clear, basal rales, no rhonchi, wheezes. Heart - S1, S2 heard, no murmurs, rubs, no pedal edema. Abdomen - Soft, non tender, bowel sounds good Neuro - Alert, awake and oriented, slow mentation, non focal exam. Skin - Warm and dry.  Data Reviewed:      Latest Ref Rng & Units 10/10/2024    9:01 AM 10/09/2024    8:35 AM 05/22/2024    5:15 PM  CBC  WBC 4.0 - 10.5 K/uL 9.6  5.7  5.8   Hemoglobin 13.0 - 17.0 g/dL 87.5  89.2  88.8   Hematocrit 39.0 - 52.0 % 37.5  33.2  32.7   Platelets 150 - 400 K/uL 157  153  145       Latest Ref Rng & Units 10/10/2024    9:01 AM 10/09/2024    8:35 AM 05/22/2024    5:15  PM  BMP  Glucose 70 - 99 mg/dL 884  798  93   BUN 8 - 23 mg/dL 30  19  18    Creatinine 0.61 - 1.24 mg/dL 8.35  8.45  8.86   Sodium 135 - 145 mmol/L 141  142  141   Potassium 3.5 - 5.1 mmol/L 4.2  3.7  3.2   Chloride 98 - 111 mmol/L 109  106  107   CO2 22 - 32 mmol/L 17  19  21    Calcium  8.9 - 10.3 mg/dL 8.9  9.7  89.3    CT ABDOMEN PELVIS W CONTRAST Result Date: 10/09/2024 CLINICAL DATA:  Diarrhea and sepsis. EXAM: CT ABDOMEN AND PELVIS WITH CONTRAST TECHNIQUE: Multidetector CT imaging of the abdomen and pelvis was performed using the standard protocol following bolus administration of intravenous contrast. RADIATION DOSE REDUCTION: This exam was performed  according to the departmental dose-optimization program which includes automated exposure control, adjustment of the mA and/or kV according to patient size and/or use of iterative reconstruction technique. CONTRAST:  75mL OMNIPAQUE  IOHEXOL  350 MG/ML SOLN COMPARISON:  05/22/2024 FINDINGS: Lower chest: Mild atelectasis at the posterior right lung base. Small amount of fluid in the visualized distal esophagus. Hepatobiliary: Normal appearance of liver without lesion. Mild gallbladder distension without visible gallstones or inflammation. Pancreas: Unremarkable. No pancreatic ductal dilatation or surrounding inflammatory changes. Spleen: Normal in size without focal abnormality. Adrenals/Urinary Tract: Adrenal glands are unremarkable. Kidneys are normal, without renal calculi, focal lesion, or hydronephrosis. Bladder is decompressed by Foley catheter. Stomach/Bowel: Diffusely abnormal colon demonstrating thickening, inflammation and both fluid and large amount of fecal material including a very large stool ball in the rectum measuring up to approximately 9.2 x 8.6 x 12 cm. Associated free fluid in the abdomen and pelvis. Multiple small bowel loops demonstrate hyperemia and are fluid-filled. Findings are likely reflective of severe stercoral colitis. No definite free intraperitoneal air, pneumatosis or focal abscess identified. Vascular/Lymphatic: Atherosclerosis of abdominal aorta without aneurysm. No lymphadenopathy identified. Reproductive: Prostate is unremarkable. Other: No abdominal wall hernia. Musculoskeletal: No acute or significant osseous findings. IMPRESSION: 1. Diffusely abnormal colon demonstrating thickening, inflammation and both fluid and large amount of fecal material including a very large stool ball in the rectum measuring up to approximately 9.2 x 8.6 x 12 cm. Associated free fluid in the abdomen and pelvis. Multiple small bowel loops demonstrate hyperemia and are fluid-filled. Findings are likely  reflective of severe stercoral colitis. 2. Aortic atherosclerosis. Electronically Signed   By: Marcey Moan M.D.   On: 10/09/2024 13:28   DG Chest Port 1 View Result Date: 10/09/2024 EXAM: 1 VIEW(S) XRAY OF THE CHEST 10/09/2024 08:47:57 AM COMPARISON: None available. CLINICAL HISTORY: 84 year old male. Questionable systemic inflammatory response syndrome - evaluate for abnormality. FINDINGS: LUNGS AND PLEURA: Lower lung volumes. No acute pulmonary opacity. No pleural effusion. No pneumothorax. HEART AND MEDIASTINUM: No acute abnormality of the cardiac and mediastinal silhouettes. BONES AND SOFT TISSUES: No acute osseous abnormality. IMPRESSION: 1. Lower lung volumes.  No acute cardiopulmonary abnormality. Electronically signed by: Helayne Hurst MD MD 10/09/2024 09:00 AM EST RP Workstation: [id number]    Family Communication: Discussed with patient's wife over phone, understand and agree. All questions answered.  Disposition: Status is: Inpatient Remains inpatient appropriate because: Monitor kidney function, mental status, blood pressures, placement.  Planned Discharge Destination: Skilled nursing facility     Time spent: 44 minutes  Author: Concepcion Riser, MD 10/10/2024 3:25 PM Secure chat 7am to 7pm For on call review  [email or web address]    "

## 2024-10-10 NOTE — Progress Notes (Signed)
 Pt arrived from ED @ this time. No complaints. Vital signs taken.

## 2024-10-10 NOTE — Evaluation (Signed)
 Occupational Therapy Evaluation Patient Details Name: Javier [name] MRN: [id number] DOB: Feb 09, 1941 Today's Date: 10/10/2024   History of Present Illness   Pt is a 84 y.o. male who presented due to sepsis.  PMHx of pre DM II, HLD, HTN, CLL, vascular dementia, remote lacunar stroke, BPH, gastric AVM with weight loss, remote tobacco abuse, anxiety, and Vit D deficiency     Clinical Impressions Pt presented with no family in the room at this time to confirm how they were completing ADLS. He was able to report  his name, he is in the hospital, in [place] and wife name but unable to cleary report further information at this time and needed redirection from pulling on cath in session. At this time pt with increase in time  was able to complete supine to sitting with min assist and sat at EOB with CGA and completed face washing post set up. Attempted to complete sit to stand transfer with max assist but was unable to complete at this time. Patient will benefit from continued inpatient follow up therapy, <3 hours/day.      If plan is discharge home, recommend the following:   Two people to help with walking and/or transfers;Two people to help with bathing/dressing/bathroom;Assistance with cooking/housework;Assistance with feeding;Direct supervision/assist for financial management;Direct supervision/assist for medications management;Assist for transportation;Help with stairs or ramp for entrance;Supervision due to cognitive status     Functional Status Assessment   Patient has had a recent decline in their functional status and demonstrates the ability to make significant improvements in function in a reasonable and predictable amount of time.     Equipment Recommendations    (TBD)     Recommendations for Other Services         Precautions/Restrictions   Precautions Precautions: Fall Recall of Precautions/Restrictions: Impaired Restrictions Weight Bearing Restrictions Per  Provider Order: No     Mobility Bed Mobility Overal bed mobility: Needs Assistance Bed Mobility: Supine to Sit, Sit to Supine     Supine to sit: Min assist, HOB elevated, Used rails Sit to supine: Min assist, HOB elevated, Used rails   General bed mobility comments: max cues    Transfers Overall transfer level: Needs assistance   Transfers: Sit to/from Stand             General transfer comment: turned around chair with locks on and attmepted to sit to stand with max assist  but could not complete      Balance Overall balance assessment: Needs assistance Sitting-balance support: Feet supported Sitting balance-Leahy Scale: Good       Standing balance-Leahy Scale: Zero Standing balance comment: unable to complete                           ADL either performed or assessed with clinical judgement   ADL Overall ADL's : Needs assistance/impaired     Grooming: Wash/dry face;Minimal assistance;Sitting   Upper Body Bathing: Minimal assistance;Moderate assistance;Sitting   Lower Body Bathing: Total assistance;Bed level   Upper Body Dressing : Minimal assistance;Moderate assistance;Sitting   Lower Body Dressing: Total assistance;Bed level       Toileting- Clothing Manipulation and Hygiene: Total assistance;Bed level               Vision         Perception         Praxis         Pertinent Vitals/Pain Pain Assessment Pain Assessment: Faces Faces  Pain Scale: Hurts whole lot Breathing: normal Negative Vocalization: occasional moan/groan, low speech, negative/disapproving quality Facial Expression: smiling or inexpressive Body Language: tense, distressed pacing, fidgeting Consolability: no need to console PAINAD Score: 2 Pain Location: cath site/peri area Pain Descriptors / Indicators: Discomfort Pain Intervention(s): Limited activity within patient's tolerance, Monitored during session, Repositioned     Extremity/Trunk Assessment  Upper Extremity Assessment Upper Extremity Assessment: Generalized weakness (noted some difficulties with RUE AROM to about 80 deg AAROM)   Lower Extremity Assessment Lower Extremity Assessment: Defer to PT evaluation   Cervical / Trunk Assessment Cervical / Trunk Assessment: Kyphotic   Communication Communication Communication: No apparent difficulties   Cognition Arousal: Lethargic Behavior During Therapy: Flat affect Cognition: Cognition impaired, No family/caregiver present to determine baseline   Orientation impairments: Time, Situation Awareness: Intellectual awareness impaired, Online awareness impaired Memory impairment (select all impairments): Short-term memory, Working civil service fast streamer, Non-declarative long-term memory, Geneticist, Molecular long-term memory Attention impairment (select first level of impairment): Focused attention Executive functioning impairment (select all impairments): Initiation, Organization, Sequencing, Reasoning, Problem solving OT - Cognition Comments: no family present to determine PLOF                 Following commands: Impaired Following commands impaired: Follows one step commands inconsistently     Cueing  General Comments   Cueing Techniques: Verbal cues      Exercises     Shoulder Instructions      Home Living Family/patient expects to be discharged to:: Private residence Living Arrangements: Spouse/significant other Available Help at Discharge: Family;Available 24 hours/day Type of Home: House Home Access: Level entry     Home Layout: One level     Bathroom Shower/Tub: Chief Strategy Officer: Standard     Home Equipment: Agricultural Consultant (2 wheels);Rollator (4 wheels);Shower seat          Prior Functioning/Environment Prior Level of Function :  (no family present but information from PT)                    OT Problem List: Decreased strength;Decreased range of motion;Decreased activity tolerance;Impaired  balance (sitting and/or standing);Decreased safety awareness;Decreased knowledge of use of DME or AE;Pain   OT Treatment/Interventions: Self-care/ADL training;Therapeutic exercise;DME and/or AE instruction;Therapeutic activities;Patient/family education;Balance training      OT Goals(Current goals can be found in the care plan section)   Acute Rehab OT Goals Patient Stated Goal: none OT Goal Formulation: Patient unable to participate in goal setting Potential to Achieve Goals: Fair   OT Frequency:  Min 2X/week    Co-evaluation              AM-PAC OT 6 Clicks Daily Activity     Outcome Measure Help from another person eating meals?: A Little Help from another person taking care of personal grooming?: A Little Help from another person toileting, which includes using toliet, bedpan, or urinal?: Total Help from another person bathing (including washing, rinsing, drying)?: Total Help from another person to put on and taking off regular upper body clothing?: A Lot Help from another person to put on and taking off regular lower body clothing?: Total 6 Click Score: 11   End of Session Equipment Utilized During Treatment: Gait belt Nurse Communication: Mobility status  Activity Tolerance: Patient limited by fatigue;No increased pain Patient left: in bed;with call bell/phone within reach;with bed alarm set  OT Visit Diagnosis: Unsteadiness on feet (R26.81);Other abnormalities of gait and mobility (R26.89);Repeated falls (R29.6);Muscle weakness (generalized) (M62.81);Pain Pain -  part of body:  (cath)                Time: 8855-8797 OT Time Calculation (min): 18 min Charges:  OT General Charges $OT Visit: 1 Visit OT Evaluation $OT Eval Low Complexity: 1 Low  Javier [name] OTR/L  Acute Rehab Services  [phone] office number   Javier [name] 10/10/2024, 12:19 PM

## 2024-10-10 NOTE — NC FL2 (Signed)
 " [name]  MEDICAID FL2 LEVEL OF CARE FORM     IDENTIFICATION  Patient Name: Javier [name] Birthdate: [birth year] Sex: male Admission Date (Current Location): 10/09/2024  [facility] and [place] Number:  Producer, Television/film/video and Address:  [facility], [address], [place], [place] [address]      Provider Number: [id number]  Attending Physician Name and Address:  Darci Pore, MD  Relative Name and Phone Number:  [name])  [phone]    Current Level of Care: Hospital Recommended Level of Care: Skilled Nursing Facility Prior Approval Number:    Date Approved/Denied:   PASRR Number: [id number]  Discharge Plan:      Current Diagnoses: Patient Active Problem List   Diagnosis Date Noted   AKI (acute kidney injury) 10/10/2024   Stercoral colitis 10/10/2024   Constipation 10/10/2024   Dehydration 10/09/2024   Severe dementia with agitation (HCC) 03/01/2024   Groin strain 02/18/2024   Esophageal dysphagia 10/26/2023   Neoplasm of uncertain behavior of lymphoid, hematopoietic and related tissue, unspecified (HCC) 02/17/2023   Pulmonary emphysema, unspecified emphysema type 02/17/2023   Memory loss 10/29/2021   Pancreatic insufficiency 08/20/2021   Left-sided weakness 08/10/2021   Generalized weakness 08/09/2021   Personal history of CLL (chronic lymphocytic leukemia) 08/09/2021   Pancytopenia (HCC) 08/09/2021   At high risk for falls 04/29/2021   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) 03/28/2021   Sleeping difficulty 03/28/2021   History of lacunar cerebrovascular accident 01/09/2021   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   BPH with obstruction/lower urinary tract symptoms 01/09/2021   First degree AV block 01/09/2021   Gait abnormality 12/20/2020   BMI 26.0-26.9,adult 03/06/2020   Gastric AVM 03/06/2020   History of colon polyps 03/06/2020   Aortic atherosclerosis (HCC) - per CT 04/2016  08/23/2019   Memory changes 05/25/2019   Anxiety 02/08/2018   CLL (chronic lymphocytic leukemia) (HCC) 11/01/2016   GERD  02/09/2015   Dyslipidemia    Hypertension    Vitamin D  deficiency    Diverticulosis     Orientation RESPIRATION BLADDER Height & Weight     Self  Normal Indwelling catheter Weight:   Height:     BEHAVIORAL SYMPTOMS/MOOD NEUROLOGICAL BOWEL NUTRITION STATUS      Incontinent Diet (See DC Summary)  AMBULATORY STATUS COMMUNICATION OF NEEDS Skin   Extensive Assist Verbally Normal                       Personal Care Assistance Level of Assistance  Bathing, Dressing, Feeding Bathing Assistance: Maximum assistance Feeding assistance: Limited assistance Dressing Assistance: Maximum assistance     Functional Limitations Info  Sight, Hearing, Speech Sight Info: Impaired Hearing Info: Impaired Speech Info: Adequate    SPECIAL CARE FACTORS FREQUENCY  PT (By licensed PT), OT (By licensed OT)     PT Frequency: 5x/week OT Frequency: 5x/week            Contractures Contractures Info: Not present    Additional Factors Info  Code Status, Allergies Code Status Info: Full Allergies Info: Penicillins; Ppd (tuberculin purified protein derivative); Tuberculin, Ppd           Current Medications (10/10/2024):  This is the current hospital active medication list Current Facility-Administered Medications  Medication Dose Route Frequency Provider Last Rate Last Admin   0.9 %  sodium chloride  infusion   Intravenous Continuous Darci Pore, MD       aspirin  EC tablet 81 mg  81 mg Oral Daily Georgina Basket, MD   81 mg at 10/10/24 0805   Chlorhexidine  Gluconate Cloth 2 % PADS 6 each  6 each Topical Daily Sreeram, Narendranath, MD       enoxaparin  (LOVENOX ) injection 40 mg  40 mg Subcutaneous Q24H Georgina Basket, MD   40 mg at 10/09/24 1848   morphine  (PF) 2 MG/ML injection 2 mg  2 mg Intravenous Q3H PRN Sreeram, Narendranath, MD       polyethylene glycol  (MIRALAX  / GLYCOLAX ) packet 17 g  17 g Oral Daily Georgina Basket, MD       QUEtiapine  (SEROQUEL ) tablet 100 mg  100 mg Oral QHS Sreeram, Narendranath, MD       rosuvastatin  (CRESTOR ) tablet 40 mg  40 mg Oral Daily Georgina Basket, MD   40 mg at 10/10/24 0805   senna-docusate (Senokot-S) tablet 1 tablet  1 tablet Oral QHS Georgina Basket, MD   1 tablet at 10/09/24 2124   sodium phosphate  (FLEET) enema 1 enema  1 enema Rectal Once Plunkett, Whitney, MD       venlafaxine  XR (EFFEXOR -XR) 24 hr capsule 75 mg  75 mg Oral Q breakfast Georgina Basket, MD   75 mg at 10/10/24 0805     Discharge Medications: Please see discharge summary for a list of discharge medications.  Relevant Imaging Results:  Relevant Lab Results:   Additional Information SSN: [id number]  Jeoffrey LITTIE Moose, LCSWA     "

## 2024-10-10 NOTE — Plan of Care (Signed)
" °  Problem: SLP Dysphagia Goals Goal: Misc Dysphagia Goal Flowsheets (Taken 10/10/2024 1517) Misc Dysphagia Goal: Pt will tolerate least restrictive diet with no overt or subtle s/s of aspiration or decline in pulmonary status.   "

## 2024-10-10 NOTE — Telephone Encounter (Signed)
 I called wife of pt.  Pt is hospital for sepsis , will be going to nursing home after this she says.  She received another call and would call back. This is from jessia's note: 09/13/2024.  ASSESSMENT AND PLAN    HARREL FERRONE is a 84 y.o. male  Dementia with anxiety             [facility] examination 16/30 (prior 16/30)             Some increased episodes of anxiety during later day/evening although greatly improved             Unclear of exact medication regimen although per wife, currently on quetiapine , trazodone  and venlafaxine , will request OV notes from new PCP to help clarify exact medications and dosages             Depending on current dose of venlafaxine , can consider increasing to further help with anxiety             Discussed nonpharmacological measures at home to help with anxiety             Discussed high dosages of medications can be contributing to daytime fatigue and unsteadiness. Continue to work with [facility] PT for hopeful improvement. Continue to follow with PCP for monitoring of current medications    ADDENDUM 09/15/2024: medication list updated per PCP med list. He is on high dose Seroquel  200mg  nightly and trazodone  100 mg nightly which could be contributing to daytime fatigue and gait changes. Consider follow up with PCP to discuss possible adjustments to medication dosages or change in therapy. Consider increasing venlafaxine  to 150mg  daily to further help with anxiety - will defer to PCP.   Received note from pcp.  Wife to call back or we can call her back.

## 2024-10-10 NOTE — Evaluation (Signed)
 Physical Therapy Evaluation Patient Details Name: Javier [name] MRN: [id number] DOB: 02/13/1941 Today's Date: 10/10/2024  History of Present Illness  Pt is a 84 y.o. male who presented with abdominal pain and found to have severe stercoral colitis with disimpaction in ED.  Dehydration and hypotension and w/u for sepsis. PMHx of pre DM II, HLD, HTN, CLL, vascular dementia, remote lacunar stroke, BPH, gastric AVM with weight loss, remote tobacco abuse, anxiety, and Vit D deficiency  Clinical Impression  Pt admitted with above diagnosis. Pt was able to sit EOB for 15 min with CGA however could not stand to feet even with max assist and cues.  Likely will need post acute rehab < 3 hours day.  Unclear how much assist wife was givign pt at home as she wasn't in room however spoke with nurse who stated that wife mentioned that pt had become difficult to care for. Will follow acutely and progress pt as able.  Pt currently with functional limitations due to the deficits listed below (see PT Problem List). Pt will benefit from acute skilled PT to increase their independence and safety with mobility to allow discharge.           If plan is discharge home, recommend the following: Two people to help with walking and/or transfers;A lot of help with bathing/dressing/bathroom;Assistance with cooking/housework;Assist for transportation   Can travel by private vehicle   No    Equipment Recommendations Other (comment) (TBA)  Recommendations for Other Services       Functional Status Assessment Patient has had a recent decline in their functional status and demonstrates the ability to make significant improvements in function in a reasonable and predictable amount of time.     Precautions / Restrictions Precautions Precautions: Fall Recall of Precautions/Restrictions: Impaired Precaution/Restrictions Comments: foley catheter Restrictions Weight Bearing Restrictions Per Provider Order: No       Mobility  Bed Mobility Overal bed mobility: Needs Assistance Bed Mobility: Supine to Sit, Sit to Supine     Supine to sit: Min assist, HOB elevated, Used rails Sit to supine: Min assist, HOB elevated, Used rails   General bed mobility comments: max cues with min assist coming to EOB    Transfers Overall transfer level: Needs assistance   Transfers: Sit to/from Stand Sit to Stand: Max assist, From elevated surface           General transfer comment: turned around chair with locks on and attmepted to sit to stand with max assist  but could not complete due to pt with left LE with difficulty keeping it on floor and due to posterior lean. Also unable to stand fully upright.    Ambulation/Gait               General Gait Details: unable  Stairs            Wheelchair Mobility     Tilt Bed    Modified Rankin (Stroke Patients Only)       Balance Overall balance assessment: Needs assistance Sitting-balance support: Feet supported Sitting balance-Leahy Scale: Good     Standing balance support: Bilateral upper extremity supported, During functional activity Standing balance-Leahy Scale: Zero Standing balance comment: unable to complete                             Pertinent Vitals/Pain Pain Assessment Pain Assessment: Faces Faces Pain Scale: Hurts whole lot Breathing: normal Facial Expression: smiling or inexpressive Consolability:  no need to console Pain Location: cath site/peri area Pain Descriptors / Indicators: Discomfort Pain Intervention(s): Limited activity within patient's tolerance, Monitored during session, Repositioned    Home Living Family/patient expects to be discharged to:: Private residence Living Arrangements: Spouse/significant other Available Help at Discharge: Family;Available 24 hours/day Type of Home: House Home Access: Level entry       Home Layout: One level Home Equipment: Agricultural Consultant (2  wheels);Rollator (4 wheels);Shower seat      Prior Function Prior Level of Function :  (no family present but information from prior chart)             Mobility Comments: Pt states he walked with RW but prior chart stated he used RW and wife assisted. ADLs Comments: wife assisted pt per prior chart     Extremity/Trunk Assessment   Upper Extremity Assessment Upper Extremity Assessment: Defer to OT evaluation    Lower Extremity Assessment Lower Extremity Assessment: Generalized weakness;Difficult to assess due to impaired cognition    Cervical / Trunk Assessment Cervical / Trunk Assessment: Kyphotic  Communication   Communication Communication: No apparent difficulties    Cognition Arousal: Lethargic Behavior During Therapy: Flat affect                             Following commands: Impaired Following commands impaired: Follows one step commands inconsistently     Cueing Cueing Techniques: Verbal cues     General Comments      Exercises General Exercises - Lower Extremity Ankle Circles/Pumps: AROM, Both, 5 reps, Supine Long Arc Quad: AROM, Both, 5 reps, Seated Hip Flexion/Marching: AROM, Both, 5 reps, Seated   Assessment/Plan    PT Assessment Patient needs continued PT services  PT Problem List Decreased activity tolerance;Decreased balance;Decreased mobility;Decreased cognition;Decreased strength;Decreased safety awareness       PT Treatment Interventions DME instruction;Gait training;Functional mobility training;Therapeutic activities;Therapeutic exercise;Balance training;Patient/family education    PT Goals (Current goals can be found in the Care Plan section)  Acute Rehab PT Goals Patient Stated Goal: unable to state due to some confusion/lethargy PT Goal Formulation: Patient unable to participate in goal setting Time For Goal Achievement: 10/24/24 Potential to Achieve Goals: Good    Frequency Min 2X/week     Co-evaluation                AM-PAC PT 6 Clicks Mobility  Outcome Measure Help needed turning from your back to your side while in a flat bed without using bedrails?: A Little Help needed moving from lying on your back to sitting on the side of a flat bed without using bedrails?: A Little Help needed moving to and from a bed to a chair (including a wheelchair)?: Total Help needed standing up from a chair using your arms (e.g., wheelchair or bedside chair)?: Total Help needed to walk in hospital room?: Total Help needed climbing 3-5 steps with a railing? : Total 6 Click Score: 10    End of Session Equipment Utilized During Treatment: Gait belt Activity Tolerance: Patient limited by fatigue Patient left: in bed;with call bell/phone within reach;with bed alarm set;with restraints reapplied Nurse Communication: Mobility status;Need for lift equipment PT Visit Diagnosis: Unsteadiness on feet (R26.81);Muscle weakness (generalized) (M62.81);Pain Pain - part of body:  (catheter site)    Time: 9064-9041 PT Time Calculation (min) (ACUTE ONLY): 23 min   Charges:   PT Evaluation $PT Eval Moderate Complexity: 1 Mod PT Treatments $Therapeutic Activity: 8-22 mins  PT General Charges $$ ACUTE PT VISIT: 1 Visit         [facility] M,PT Acute Rehab Services [phone]   Javier [name] Bevel 10/10/2024, 1:16 PM

## 2024-10-10 NOTE — Evaluation (Signed)
 Clinical/Bedside Swallow Evaluation Patient Details  Name: Javier [name] MRN: [id number] Date of Birth: [birth year]  Today's Date: 10/10/2024 Time: SLP Start Time (ACUTE ONLY): 1339 SLP Stop Time (ACUTE ONLY): 1402 SLP Time Calculation (min) (ACUTE ONLY): 23 min  Past Medical History:  Past Medical History:  Diagnosis Date   Diverticulosis    Hyperlipidemia    Hypertension    Memory loss    Prediabetes    Vitamin D  deficiency    Past Surgical History:  Past Surgical History:  Procedure Laterality Date   NO PAST SURGERIES     HPI:  Javier [name] is a 84 y.o. male with medical history significant of vascular dementia, CLL in remission, and HTN who p/w abd pain and found to have imaging c/f severe stercoral colitis s/p disimpaction and resolution of abd in ED. SLP consulted for clinical swallow assessment due to reported hx of nonspecific dysphagia per pt's wife.    Assessment / Plan / Recommendation  Clinical Impression   Pt presents with a mild oral dysphagia and clinical s/s of pharyngeal dysphagia that seem consistent with MBSS results from Dec 2022; particularly, observation that degenerative changes in cervical spine that impacted patency of UES. However, pt demonstrated spontaneous sub-swallows that cleared residue.   Recommend a mechanical soft diet and thin liquids as tolerated with implementation of safe swallow strategies.   Oral phase significant for reduced labial seal around cup lip resulting in trace-min anterior loss of thin liquids. Improved oral efficiency and oral control observed with straw use. Increased oral prep and transit observed with regular solid with min diffuse residue noted; cleared with thin liquid wash x1 and puree wash x1.   Pharyngeal phase notable for multiple swallows with both liquids and solids (which is likely consistent with reduced patency of UES noted on MBSS in 2022), and intermittent throat clearing following both liquids and solids.  No significant coughing episodes observed.   SLP will follow up to assess diet tolerance and modify diet as indicated. Will consider MBSS if there are concerns for aspiration with current diet; however, anticipate pt may not tolerate MBSS well given anxiety and restlessness observed today. SLP will continue to follow.   SLP Visit Diagnosis: Dysphagia, unspecified (R13.10)    Aspiration Risk  Mild aspiration risk    Diet Recommendation Dysphagia 3 (Mech soft);Thin liquid    Liquid Administration via: Cup;Straw Medication Administration: Whole meds with puree Supervision: Staff to assist with self feeding Compensations: Slow rate;Small sips/bites;Multiple dry swallows after each bite/sip;Follow solids with liquid Postural Changes: Seated upright at 90 degrees    Other Recommendations Oral Care Recommendations: Oral care BID     Swallow Evaluation Recommendations     Assistance Recommended at Discharge    Functional Status Assessment Patient has had a recent decline in their functional status and demonstrates the ability to make significant improvements in function in a reasonable and predictable amount of time.  Frequency and Duration min 1 x/week  2 weeks       Prognosis Prognosis for improved oropharyngeal function: Fair Barriers to Reach Goals: Cognitive deficits      Swallow Study   General Date of Onset: 10/10/24 HPI: Javier [name] is a 84 y.o. male with medical history significant of vascular dementia, CLL in remission, and HTN who p/w abd pain and found to have imaging c/f severe stercoral colitis s/p disimpaction and resolution of abd in ED. SLP consulted for clinical swallow assessment due to reported hx of nonspecific dysphagia  per pt's wife. Type of Study: Bedside Swallow Evaluation Previous Swallow Assessment: MBSS in Dec 2022 that was grossly WNL with recommendation of a regualr diet and thin liquids + compensatory swallow strategies such as slower pace, and small  bites/sips Diet Prior to this Study: Regular;Thin liquids (Level 0) Temperature Spikes Noted: No Respiratory Status: Room air Behavior/Cognition: Alert;Cooperative;Requires cueing;Agitated Oral Cavity Assessment: Within Functional Limits Oral Care Completed by SLP: No Oral Cavity - Dentition: Poor condition;Missing dentition Vision: Functional for self-feeding Self-Feeding Abilities: Needs set up;Needs assist Patient Positioning: Upright in bed Baseline Vocal Quality: Normal Volitional Cough: Cognitively unable to elicit Volitional Swallow: Able to elicit    Oral/Motor/Sensory Function Overall Oral Motor/Sensory Function: Mild impairment (reduced labial seal observed with sips of thin liquids; unable to complete formal OME)   Ice Chips Ice chips: Within functional limits   Thin Liquid Thin Liquid: Impaired Oral Phase Impairments: Reduced labial seal Pharyngeal  Phase Impairments: Multiple swallows;Throat Clearing - Delayed    Nectar Thick Nectar Thick Liquid: Not tested   Honey Thick Honey Thick Liquid: Not tested   Puree Puree: Impaired Pharyngeal Phase Impairments: Multiple swallows   Solid     Solid: Impaired Oral Phase Impairments: Impaired mastication Oral Phase Functional Implications: Oral residue;Prolonged oral transit Pharyngeal Phase Impairments: Multiple swallows      Javier [name] 10/10/2024,3:31 PM

## 2024-10-10 NOTE — TOC Initial Note (Signed)
 Transition of Care [facility]) - Initial/Assessment Note    Patient Details  Name: Javier [name] MRN: [id number] Date of Birth: June 24, 1941  Transition of Care [facility]) CM/SW Contact:    Javier [name], Javier [name] Phone Number: 10/10/2024, 3:40 PM  Clinical Narrative:                 Pt admitted from home with spouse after being found in his own feces. Pt currently Ox1- CSW contacted pt wife, Javier [name] and completed SNF workup. Pt has been to [name] about 2 years ago and uses a walker at home. CSW completde Fl2 and sent out SNF referrals. CSW to follow up with bed offers.   Expected Discharge Plan: Skilled Nursing Facility Barriers to Discharge: Continued Medical Work up, English As A Second Language Teacher, SNF Pending bed offer   Patient Goals and CMS Choice Patient states their goals for this hospitalization and ongoing recovery are:: SNF          Expected Discharge Plan and Services       Living arrangements for the past 2 months: Single Family Home                                      Prior Living Arrangements/Services Living arrangements for the past 2 months: Single Family Home Lives with:: Spouse Patient language and need for interpreter reviewed:: Yes Do you feel safe going back to the place where you live?: Yes      Need for Family Participation in Patient Care: Yes (Comment) Care giver support system in place?: Yes (comment)   Criminal Activity/Legal Involvement Pertinent to Current Situation/Hospitalization: No - Comment as needed  Activities of Daily Living   ADL Screening (condition at time of admission) Independently performs ADLs?: No Does the patient have a NEW difficulty with bathing/dressing/toileting/[name]-feeding that is expected to last >3 days?: Yes (Initiates electronic notice to provider for possible OT consult) Does the patient have a NEW difficulty with getting in/out of bed, walking, or climbing stairs that is expected to last >3 days?: Yes (Initiates  electronic notice to provider for possible PT consult) Does the patient have a NEW difficulty with communication that is expected to last >3 days?: Yes (Initiates electronic notice to provider for possible SLP consult) Is the patient deaf or have difficulty hearing?: Yes Does the patient have difficulty seeing, even when wearing glasses/contacts?: Yes Does the patient have difficulty concentrating, remembering, or making decisions?: Yes  Permission Sought/Granted Permission sought to share information with : Facility Medical Sales Representative, Family Supports    Share Information with NAME: Javier [name]  Permission granted to share info w AGENCY: SNFs  Permission granted to share info w Relationship: Spouse  Permission granted to share info w Contact Information: [phone]  Emotional Assessment Appearance:: Appears stated age Attitude/Demeanor/Rapport: Unable to Assess Affect (typically observed): Unable to Assess Orientation: : Oriented to [name] Alcohol / Substance Use: Not Applicable Psych Involvement: No (comment)  Admission diagnosis:  Dehydration [E86.0] Fecal impaction (HCC) [K56.41] Hypotension due to hypovolemia [E86.1] Sepsis without acute organ dysfunction, due to unspecified organism [name]) [A41.9] Stercoral colitis [K52.89] Patient Active Problem List   Diagnosis Date Noted   AKI (acute kidney injury) 10/10/2024   Stercoral colitis 10/10/2024   Constipation 10/10/2024   Dehydration 10/09/2024   Severe dementia with agitation (HCC) 03/01/2024   Groin strain 02/18/2024   Esophageal dysphagia 10/26/2023   Neoplasm of uncertain behavior of lymphoid, hematopoietic and  related tissue, unspecified (HCC) 02/17/2023   Pulmonary emphysema, unspecified emphysema type 02/17/2023   Memory loss 10/29/2021   Pancreatic insufficiency 08/20/2021   Left-sided weakness 08/10/2021   Generalized weakness 08/09/2021   Personal history of CLL (chronic lymphocytic leukemia) 08/09/2021    Pancytopenia (HCC) 08/09/2021   At high risk for falls 04/29/2021   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) 03/28/2021   Sleeping difficulty 03/28/2021   History of lacunar cerebrovascular accident 01/09/2021   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   BPH with obstruction/lower urinary tract symptoms 01/09/2021   First degree AV block 01/09/2021   Gait abnormality 12/20/2020   BMI 26.0-26.9,adult 03/06/2020   Gastric AVM 03/06/2020   History of colon polyps 03/06/2020   Aortic atherosclerosis (HCC) - per CT 04/2016 08/23/2019   Memory changes 05/25/2019   Anxiety 02/08/2018   CLL (chronic lymphocytic leukemia) (HCC) 11/01/2016   GERD  02/09/2015   Dyslipidemia    Hypertension    Vitamin D  deficiency    Diverticulosis    PCP:  [facility], [address] Pharmacy:   [facility] - [place], [place] - [address] [place] [address] Phone: [phone] Fax: [phone]  [facility] [address], [place] - [address] [place] [address] Phone: [phone] Fax: [phone]  [facility] [address] [place] [address] Phone: [phone] Fax: [phone]     Social Drivers of Health (SDOH) Social History: SDOH Screenings   Food Insecurity: No Food Insecurity (05/05/2024)  Housing: Unknown (05/05/2024)  Transportation Needs: No Transportation Needs (05/05/2024)  Utilities: Not At Risk (05/05/2024)  Alcohol Screen: Low Risk (05/05/2024)  Depression (PHQ2-9): Medium Risk (05/05/2024)  Financial Resource Strain: Low Risk (05/05/2024)  Physical Activity: Inactive (05/05/2024)  Social Connections: Moderately Integrated (05/05/2024)  Stress: Stress Concern Present (05/05/2024)  Tobacco Use: Medium Risk (09/13/2024)  Health Literacy: Adequate Health Literacy (05/05/2024)   SDOH Interventions:     Readmission Risk  Interventions     No data to display

## 2024-10-10 NOTE — Progress Notes (Addendum)
 Pt agitated and very restless. Climbing out of bed every several minutes. Pulling at foley catheter. Pulled out PIV. Mitts placed but pt able to remove them. Dr. Charlton notified. 1:1 sitter order placed. IM haldol  administered per order. Pt refusing PO intake at this time. Will reattempt. Bed alarm on middle sensitivity setting.

## 2024-10-11 ENCOUNTER — Inpatient Hospital Stay (HOSPITAL_COMMUNITY)

## 2024-10-11 DIAGNOSIS — E860 Dehydration: Secondary | ICD-10-CM | POA: Diagnosis not present

## 2024-10-11 DIAGNOSIS — R9431 Abnormal electrocardiogram [ECG] [EKG]: Secondary | ICD-10-CM

## 2024-10-11 LAB — RENAL FUNCTION PANEL
Albumin: 3 g/dL — ABNORMAL LOW (ref 3.5–5.0)
Anion gap: 14 (ref 5–15)
BUN: 43 mg/dL — ABNORMAL HIGH (ref 8–23)
CO2: 18 mmol/L — ABNORMAL LOW (ref 22–32)
Calcium: 8.9 mg/dL (ref 8.9–10.3)
Chloride: 109 mmol/L (ref 98–111)
Creatinine, Ser: 2.1 mg/dL — ABNORMAL HIGH (ref 0.61–1.24)
GFR, Estimated: 31 mL/min — ABNORMAL LOW
Glucose, Bld: 64 mg/dL — ABNORMAL LOW (ref 70–99)
Phosphorus: 3.8 mg/dL (ref 2.5–4.6)
Potassium: 4.4 mmol/L (ref 3.5–5.1)
Sodium: 141 mmol/L (ref 135–145)

## 2024-10-11 LAB — BASIC METABOLIC PANEL WITH GFR
Anion gap: 12 (ref 5–15)
BUN: 41 mg/dL — ABNORMAL HIGH (ref 8–23)
CO2: 21 mmol/L — ABNORMAL LOW (ref 22–32)
Calcium: 8.9 mg/dL (ref 8.9–10.3)
Chloride: 112 mmol/L — ABNORMAL HIGH (ref 98–111)
Creatinine, Ser: 2.37 mg/dL — ABNORMAL HIGH (ref 0.61–1.24)
GFR, Estimated: 27 mL/min — ABNORMAL LOW
Glucose, Bld: 156 mg/dL — ABNORMAL HIGH (ref 70–99)
Potassium: 3.1 mmol/L — ABNORMAL LOW (ref 3.5–5.1)
Sodium: 145 mmol/L (ref 135–145)

## 2024-10-11 LAB — GLUCOSE, CAPILLARY
Glucose-Capillary: 113 mg/dL — ABNORMAL HIGH (ref 70–99)
Glucose-Capillary: 155 mg/dL — ABNORMAL HIGH (ref 70–99)
Glucose-Capillary: 18 mg/dL — CL (ref 70–99)
Glucose-Capillary: 25 mg/dL — CL (ref 70–99)
Glucose-Capillary: 35 mg/dL — CL (ref 70–99)
Glucose-Capillary: 41 mg/dL — CL (ref 70–99)
Glucose-Capillary: 45 mg/dL — ABNORMAL LOW (ref 70–99)
Glucose-Capillary: 46 mg/dL — ABNORMAL LOW (ref 70–99)
Glucose-Capillary: 77 mg/dL (ref 70–99)

## 2024-10-11 LAB — ECHOCARDIOGRAM COMPLETE
Area-P 1/2: 3.31 cm2
MV M vel: 5.25 m/s
MV Peak grad: 110.3 mmHg
S' Lateral: 2.5 cm
Single Plane A4C EF: 57.2 %

## 2024-10-11 LAB — LACTIC ACID, PLASMA
Lactic Acid, Venous: 3.8 mmol/L (ref 0.5–1.9)
Lactic Acid, Venous: 3.8 mmol/L (ref 0.5–1.9)

## 2024-10-11 LAB — MAGNESIUM: Magnesium: 2.8 mg/dL — ABNORMAL HIGH (ref 1.7–2.4)

## 2024-10-11 MED ORDER — DEXTROSE 50 % IV SOLN
50.0000 mL | Freq: Once | INTRAVENOUS | Status: AC
  Administered 2024-10-11: 50 mL via INTRAVENOUS

## 2024-10-11 MED ORDER — DEXTROSE 50 % IV SOLN
INTRAVENOUS | Status: AC
  Filled 2024-10-11: qty 50

## 2024-10-11 MED ORDER — DEXTROSE 10 % IV SOLN: INTRAVENOUS | Status: DC

## 2024-10-11 MED ORDER — DEXTROSE-SODIUM CHLORIDE 5-0.45 % IV SOLN: INTRAVENOUS | Status: DC

## 2024-10-11 MED ORDER — SODIUM BICARBONATE 8.4 % IV SOLN
INTRAVENOUS | Status: DC
  Filled 2024-10-11: qty 1000

## 2024-10-11 MED ORDER — POLYETHYLENE GLYCOL 3350 17 G PO PACK
17.0000 g | PACK | Freq: Two times a day (BID) | ORAL | Status: DC
  Administered 2024-10-11 – 2024-10-15 (×8): 17 g via ORAL
  Filled 2024-10-11 (×10): qty 1

## 2024-10-11 MED ORDER — SODIUM CHLORIDE 0.9 % IV SOLN
2.0000 g | Freq: Every day | INTRAVENOUS | Status: DC
  Administered 2024-10-11 – 2024-10-12 (×2): 2 g via INTRAVENOUS
  Filled 2024-10-11 (×2): qty 20

## 2024-10-11 MED ORDER — DEXTROSE 50 % IV SOLN
INTRAVENOUS | Status: AC
  Administered 2024-10-11: 12.5 g via INTRAVENOUS
  Filled 2024-10-11: qty 50

## 2024-10-11 MED ORDER — METRONIDAZOLE 500 MG/100ML IV SOLN
500.0000 mg | Freq: Two times a day (BID) | INTRAVENOUS | Status: DC
  Administered 2024-10-11 (×2): 500 mg via INTRAVENOUS
  Filled 2024-10-11 (×3): qty 100

## 2024-10-11 MED ORDER — ENOXAPARIN SODIUM 30 MG/0.3ML IJ SOSY
30.0000 mg | PREFILLED_SYRINGE | INTRAMUSCULAR | Status: DC
  Administered 2024-10-11 – 2024-10-16 (×6): 30 mg via SUBCUTANEOUS
  Filled 2024-10-11 (×6): qty 0.3

## 2024-10-11 MED ORDER — DEXTROSE 50 % IV SOLN: 12.5000 g | INTRAVENOUS | Status: AC

## 2024-10-11 MED ORDER — DEXTROSE 50 % IV SOLN
25.0000 g | INTRAVENOUS | Status: AC
  Administered 2024-10-11: 25 g via INTRAVENOUS

## 2024-10-11 MED ORDER — DEXTROSE 50 % IV SOLN
INTRAVENOUS | Status: AC
  Administered 2024-10-11: 25 g via INTRAVENOUS
  Filled 2024-10-11: qty 50

## 2024-10-11 MED ORDER — ENSURE PLUS HIGH PROTEIN PO LIQD
237.0000 mL | Freq: Three times a day (TID) | ORAL | Status: DC
  Administered 2024-10-11 – 2024-10-17 (×16): 237 mL via ORAL

## 2024-10-11 NOTE — Progress Notes (Signed)
 [facility] Progress Note  Patient: Javier [name]    FMW:[id number]  DOA: 10/09/2024     Date of Service: the patient was seen and examined on 10/11/2024  Chief Complaint  Patient presents with   Code Sepsis   Brief hospital course: Javier [name] is a 84 y.o. male with medical history significant of vascular dementia, CLL in remission, and HTN who p/w abd pain and found to have imaging c/f severe stercoral colitis s/p disimpaction and resolution of abdominal discomfort and in ED admitted for further management evaluation of dehydration, constipation, lactic acidosis, AKI.     Assessment and Plan:  # Stercoral colitis due to Severe constipation Status post manual disimpaction Continue gentle IV fluids. Continue aggressive bowel regimen. Encourage oral diet, supplements. 1/12 started prophylactic antibiotics ceftriaxone  and Flagyl     # AKI: Due to dehydration. Gentle IV fluids. Hold ARB at this time. Monitor daily renal function. Continue Foley catheter for now, voiding trial in 1 to 2 days   # Metabolic acidosis due to renal failure Started bicarb IV infusion Monitor BMP daily  # Hypoglycemia secondary to decreased oral intake. Continue bicarb with D5 Monitor CBG  # Lactic acidosis- Likely due to severe colon inflammation due to constipation. Abdomen benign. No signs of sepsis. Continue IV hydration, monitor Lactic acid level.    # Hypertension: Hold losartan  at this time due to AKI, borderline low BP. Monitor vitals closely.   Failure to thrive Vascular dementia- PT OT recommended skilled nursing facility placement. Wife wishes to continue venlafaxine , Seroquel  which helped him. Wife agrees with SNF placement  There is no height or weight on file to calculate BMI.  Interventions:  Diet: Dysphagia 3 diet DVT Prophylaxis: Subcutaneous Lovenox    Advance goals of care discussion: Full code  Family Communication: family was present at bedside, at  the time of interview.  The pt provided permission to discuss medical plan with the family. Opportunity was given to ask question and all questions were answered satisfactorily.  1/13 d/w patient's wife at bedside.  Disposition:  Pt is from Home, admitted with AKI, severe constipation, lactic acidosis., still has renal failure and electrolyte imbalance, which precludes a safe discharge. Discharge to SNF, when stable, may need few days to improve.  Subjective: No significant events overnight.  Patient was resting already, denied any specific complaints.  Physical Exam: General: NAD, lying comfortably Appear in no distress, affect appropriate Eyes: PERRLA ENT: Oral Mucosa Clear, moist  Neck: no JVD,  Cardiovascular: S1 and S2 Present, no Murmur,  Respiratory: good respiratory effort, Bilateral Air entry equal and Decreased, no Crackles, no wheezes Abdomen: Bowel Sound present, Soft and no tenderness,  Skin: no rashes Extremities: no Pedal edema, no calf tenderness Neurologic: without any new focal findings Gait not checked due to patient safety concerns  Vitals:   10/11/24 0006 10/11/24 0459 10/11/24 0744 10/11/24 1031  BP: (!) 127/57 (!) 124/99 (!) 146/68 134/67  Pulse: 71 84 (!) 52 97  Resp: 20 18 (!) 24 18  Temp: 98.3 F (36.8 C) 98 F (36.7 C) 98 F (36.7 C)   TempSrc: Axillary Axillary Axillary   SpO2: 91% 95% 91% 96%    Intake/Output Summary (Last 24 hours) at 10/11/2024 1208 Last data filed at 10/11/2024 1058 Gross per 24 hour  Intake 382.13 ml  Output 800 ml  Net -417.87 ml   There were no vitals filed for this visit.  Data Reviewed: I have personally reviewed and interpreted daily labs, tele  strips, imagings as discussed above. I reviewed all nursing notes, pharmacy notes, vitals, pertinent old records I have discussed plan of care as described above with RN and patient/family.  CBC: Recent Labs  Lab 10/09/24 0835 10/10/24 0901  WBC 5.7 9.6  NEUTROABS 4.7   --   HGB 10.7* 12.4*  HCT 33.2* 37.5*  MCV 95.7 92.8  PLT 153 157   Basic Metabolic Panel: Recent Labs  Lab 10/09/24 0835 10/10/24 0901 10/11/24 0210  NA 142 141 141  K 3.7 4.2 4.4  CL 106 109 109  CO2 19* 17* 18*  GLUCOSE 201* 115* 64*  BUN 19 30* 43*  CREATININE 1.54* 1.64* 2.10*  CALCIUM  9.7 8.9 8.9  MG 3.7*  --  2.8*  PHOS  --   --  3.8    Studies: ECHOCARDIOGRAM COMPLETE Result Date: 10/11/2024    ECHOCARDIOGRAM REPORT   Patient Name:   Javier [name] [facility] Date of Exam: 10/11/2024 Medical Rec #:  [id number]        Height:       71.0 in Accession #:    [id number]       Weight:       145.0 lb Date of Birth:  1941-06-06         BSA:          1.839 m Patient Age:    83 years         BP:           146/68 mmHg Patient Gender: M                HR:           66 bpm. Exam Location:  Inpatient Procedure: 2D Echo, Cardiac Doppler and Color Doppler (Both Spectral and Color            Flow Doppler were utilized during procedure). Indications:    Abnormal ECG R94.31  History:        Patient has prior history of Echocardiogram examinations, most                 recent 08/10/2021. Abnormal ECG; Risk Factors:Dyslipidemia and                 Hypertension.  Sonographer:    Koleen Popper RDCS Referring Phys: [id number] IPOZZE XLFJM  Sonographer Comments: Technically challenging study due to limited acoustic windows. Image acquisition challenging due to respiratory motion, Image acquisition challenging due to patient body habitus and Image acquisition challenging due to uncooperative  patient. IMPRESSIONS  1. Left ventricular ejection fraction, by estimation, is 60 to 65%. The left ventricle has normal function. The left ventricle has no regional wall motion abnormalities. There is mild asymmetric left ventricular hypertrophy of the basal-septal segment. Left ventricular diastolic parameters were normal.  2. Right ventricular systolic function is normal. The right ventricular size is normal. There is normal  pulmonary artery systolic pressure.  3. The mitral valve is grossly normal. Mild mitral valve regurgitation. No evidence of mitral stenosis.  4. The aortic valve is grossly normal. Aortic valve regurgitation is not visualized. No aortic stenosis is present. Comparison(s): No significant change from prior study. FINDINGS  Left Ventricle: Left ventricular ejection fraction, by estimation, is 60 to 65%. The left ventricle has normal function. The left ventricle has no regional wall motion abnormalities. The left ventricular internal cavity size was normal in size. There is  mild asymmetric left ventricular hypertrophy of the basal-septal segment. Left ventricular diastolic parameters  were normal. Right Ventricle: The right ventricular size is normal. No increase in right ventricular wall thickness. Right ventricular systolic function is normal. There is normal pulmonary artery systolic pressure. The tricuspid regurgitant velocity is 2.36 m/s, and  with an assumed right atrial pressure of 3 mmHg, the estimated right ventricular systolic pressure is 25.3 mmHg. Left Atrium: Left atrial size was normal in size. Right Atrium: Right atrial size was normal in size. Pericardium: There is no evidence of pericardial effusion. Mitral Valve: The mitral valve is grossly normal. Mild mitral valve regurgitation. No evidence of mitral valve stenosis. Tricuspid Valve: The tricuspid valve is grossly normal. Tricuspid valve regurgitation is trivial. No evidence of tricuspid stenosis. Aortic Valve: The aortic valve is grossly normal. Aortic valve regurgitation is not visualized. No aortic stenosis is present. Pulmonic Valve: The pulmonic valve was grossly normal. Pulmonic valve regurgitation is not visualized. No evidence of pulmonic stenosis. Aorta: The aortic root is normal in size and structure. Venous: The inferior vena cava was not well visualized. IAS/Shunts: The atrial septum is grossly normal.  LEFT VENTRICLE PLAX 2D LVIDd:          4.10 cm      Diastology LVIDs:         2.50 cm      LV e' medial:    7.72 cm/s LV PW:         0.90 cm      LV E/e' medial:  9.7 LV IVS:        1.20 cm      LV e' lateral:   8.92 cm/s LVOT diam:     2.20 cm      LV E/e' lateral: 8.4 LV SV:         71 LV SV Index:   38 LVOT Area:     3.80 cm  LV Volumes (MOD) LV vol d, MOD A4C: 128.0 ml LV vol s, MOD A4C: 54.8 ml LV SV MOD A4C:     128.0 ml RIGHT VENTRICLE             IVC RV Basal diam:  3.60 cm     IVC diam: 1.80 cm RV S prime:     19.50 cm/s TAPSE (M-mode): 2.8 cm LEFT ATRIUM             Index        RIGHT ATRIUM           Index LA diam:        3.80 cm 2.07 cm/m   RA Area:     14.60 cm LA Vol (A2C):   33.9 ml 18.43 ml/m  RA Volume:   34.70 ml  18.87 ml/m LA Vol (A4C):   39.7 ml 21.59 ml/m LA Biplane Vol: 37.7 ml 20.50 ml/m  AORTIC VALVE LVOT Vmax:   99.60 cm/s LVOT Vmean:  65.000 cm/s LVOT VTI:    0.186 m  AORTA Ao Root diam: 3.10 cm MITRAL VALVE               TRICUSPID VALVE MV Area (PHT): 3.31 cm    TR Peak grad:   22.3 mmHg MV Decel Time: 229 msec    TR Vmax:        236.00 cm/s MR Peak grad: 110.2 mmHg MR Vmax:      525.00 cm/s  SHUNTS MV E velocity: 74.90 cm/s  Systemic VTI:  0.19 m MV A velocity: 99.60 cm/s  Systemic Diam: 2.20 cm MV E/A ratio:  0.75 Javier Decent MD Electronically signed by Javier Decent MD Signature Date/Time: 10/11/2024/9:57:09 AM    Final     Scheduled Meds:  aspirin  EC  81 mg Oral Daily   Chlorhexidine  Gluconate Cloth  6 each Topical Daily   enoxaparin  (LOVENOX ) injection  40 mg Subcutaneous Q24H   polyethylene glycol  17 g Oral BID   QUEtiapine   100 mg Oral QHS   rosuvastatin   40 mg Oral Daily   senna-docusate  1 tablet Oral QHS   sodium phosphate   1 enema Rectal Once   venlafaxine  XR  75 mg Oral Q breakfast   Continuous Infusions:  cefTRIAXone  (ROCEPHIN )  IV 2 g (10/11/24 1058)   metronidazole  500 mg (10/11/24 0922)   sodium bicarbonate  150 mEq in dextrose  5 % 1,150 mL infusion 75 mL/hr at 10/11/24 0923   PRN  Meds: morphine  injection  Time spent: 55 minutes  Author: ELVAN SOR. MD [facility] 10/11/2024 12:08 PM  To reach On-call, see care teams to locate the attending and reach out to them via [email or web address]. If 7PM-7AM, please contact night-coverage If you still have difficulty reaching the attending provider, please page the [facility] (Director on Call) for [facility] on amion for assistance.

## 2024-10-11 NOTE — Plan of Care (Signed)
" °  Problem: Nutrition: Goal: Adequate nutrition will be maintained Outcome: Progressing   Problem: Coping: Goal: Level of anxiety will decrease Outcome: Progressing   Problem: Elimination: Goal: Will not experience complications related to bowel motility Outcome: Progressing Goal: Will not experience complications related to urinary retention Outcome: Progressing   Problem: Pain Managment: Goal: General experience of comfort will improve and/or be controlled Outcome: Progressing   Problem: Skin Integrity: Goal: Risk for impaired skin integrity will decrease Outcome: Progressing   "

## 2024-10-11 NOTE — Progress Notes (Signed)
" °  Echocardiogram 2D Echocardiogram has been performed.  Koleen KANDICE Popper, RDCS 10/11/2024, 9:19 AM "

## 2024-10-11 NOTE — Significant Event (Signed)
 Rapid Response Event Note   Reason for Call :  hypoglycemia  Initial Focused Assessment:  Per bedside MW:Lwjaoz to obtain oxygen saturation from probe placed on fingers. CBG per finger stick 41, treated per protocol with 50% dextrose , rechecked CBG per finger stick 35, second dose of 50% dextrose  given.  Patient resting in the bed, at baseline neurologically, awakes to voice, oriented to person and place. Moves all extremities cool to the touch.  Interventions:  Rechecked CBG from blood draw being obtained at bedside. 155  Plan of Care:  If fingers stick continues to be low notified MD for potential lab draw instead.    Event Summary:   Call Time: 1840 Arrival Time: 1908 End Time: 1925  Delon Daub, RN

## 2024-10-11 NOTE — Progress Notes (Signed)
 Hypoglycemic Event  CBG: 25  Treatment: D50 25ml   Symptoms: none  Follow-up CBG: Time:   CBG Result:***  Possible Reasons for Event: Inadequate meal intake  Comments/MD notified: Elvan Sor MD notified at 1630.      Corneilus Heggie V Roxana Lai

## 2024-10-11 NOTE — Plan of Care (Addendum)
 Pt sitting completely upright for PM med administration. Offered pt PO intake present on dinner tray numerous times. Pt refused. Pt agreeable to small bite of applesauce with crushed Seroquel  after significant time spent trying to get pt to agree to take medication. Pt took bite and swallowed but immediately started coughing. Coughing spell lasted about 15-20 seconds. Pt did not agree to take any additional medication (unable to administer scheduled senna). Concern for aspiration safety r/t PO intake, even with medications crushed in applesauce. Vitals stable, no oxygen desaturation noted. Suction w/ yaunkeur set up at bedside.   04:47 Noted that blood glucose 64 with labwork drawn at 02:00. Hypoglycemia protocol initiated. 12.5g dextrose  50% administered per protocol. Recheck of CBG 45 @04 :52. Hypoglycemia protocol ordered again. Per protocol, 25g dextrose  50% administered. Dr. Charlton notified. IV fluid order changed to 0.45% Saline w/ Dextrose  5% @ rate of 75cc/hr. Recheck CBG 05:13 77. Pt responsive to voice during hypoglycemia. Vitals WNL and stable. Pt has required 3L supplemental oxygen via nasal cannula to maintain oxygen saturations > 90%.    Problem: Education: Goal: Knowledge of General Education information will improve Description: Including pain rating scale, medication(s)/side effects and non-pharmacologic comfort measures Outcome: Progressing   Problem: Health Behavior/Discharge Planning: Goal: Ability to manage health-related needs will improve Outcome: Progressing   Problem: Clinical Measurements: Goal: Ability to maintain clinical measurements within normal limits will improve Outcome: Progressing Goal: Will remain free from infection Outcome: Progressing Goal: Diagnostic test results will improve Outcome: Progressing Goal: Respiratory complications will improve Outcome: Progressing Goal: Cardiovascular complication will be avoided Outcome: Progressing   Problem:  Activity: Goal: Risk for activity intolerance will decrease Outcome: Progressing   Problem: Nutrition: Goal: Adequate nutrition will be maintained Outcome: Progressing   Problem: Coping: Goal: Level of anxiety will decrease Outcome: Progressing   Problem: Elimination: Goal: Will not experience complications related to bowel motility Outcome: Progressing Goal: Will not experience complications related to urinary retention Outcome: Progressing   Problem: Pain Managment: Goal: General experience of comfort will improve and/or be controlled Outcome: Progressing   Problem: Safety: Goal: Ability to remain free from injury will improve Outcome: Progressing   Problem: Skin Integrity: Goal: Risk for impaired skin integrity will decrease Outcome: Progressing

## 2024-10-12 DIAGNOSIS — E860 Dehydration: Secondary | ICD-10-CM | POA: Diagnosis not present

## 2024-10-12 LAB — GLUCOSE, CAPILLARY
Glucose-Capillary: 138 mg/dL — ABNORMAL HIGH (ref 70–99)
Glucose-Capillary: 120 mg/dL — ABNORMAL HIGH (ref 70–99)
Glucose-Capillary: 111 mg/dL — ABNORMAL HIGH (ref 70–99)
Glucose-Capillary: 96 mg/dL (ref 70–99)

## 2024-10-12 LAB — CBC
HCT: 29.6 % — ABNORMAL LOW (ref 39.0–52.0)
Hemoglobin: 9.7 g/dL — ABNORMAL LOW (ref 13.0–17.0)
MCH: 30.5 pg (ref 26.0–34.0)
MCHC: 32.8 g/dL (ref 30.0–36.0)
MCV: 93.1 fL (ref 80.0–100.0)
Platelets: 107 K/uL — ABNORMAL LOW (ref 150–400)
RBC: 3.18 MIL/uL — ABNORMAL LOW (ref 4.22–5.81)
RDW: 14.2 % (ref 11.5–15.5)
WBC: 5.5 K/uL (ref 4.0–10.5)
nRBC: 0 % (ref 0.0–0.2)

## 2024-10-12 LAB — BASIC METABOLIC PANEL WITH GFR
Anion gap: 10 (ref 5–15)
BUN: 37 mg/dL — ABNORMAL HIGH (ref 8–23)
CO2: 20 mmol/L — ABNORMAL LOW (ref 22–32)
Calcium: 8.9 mg/dL (ref 8.9–10.3)
Chloride: 114 mmol/L — ABNORMAL HIGH (ref 98–111)
Creatinine, Ser: 2.39 mg/dL — ABNORMAL HIGH (ref 0.61–1.24)
GFR, Estimated: 26 mL/min — ABNORMAL LOW
Glucose, Bld: 132 mg/dL — ABNORMAL HIGH (ref 70–99)
Potassium: 3.4 mmol/L — ABNORMAL LOW (ref 3.5–5.1)
Sodium: 144 mmol/L (ref 135–145)

## 2024-10-12 LAB — PHOSPHORUS: Phosphorus: 2.5 mg/dL (ref 2.5–4.6)

## 2024-10-12 LAB — MAGNESIUM: Magnesium: 2.7 mg/dL — ABNORMAL HIGH (ref 1.7–2.4)

## 2024-10-12 MED ORDER — TRAZODONE HCL 50 MG PO TABS
100.0000 mg | ORAL_TABLET | Freq: Every day | ORAL | Status: DC
  Administered 2024-10-12 – 2024-10-16 (×4): 100 mg via ORAL
  Filled 2024-10-12 (×5): qty 2

## 2024-10-12 MED ORDER — ACETAMINOPHEN 325 MG PO TABS
650.0000 mg | ORAL_TABLET | Freq: Four times a day (QID) | ORAL | Status: DC | PRN
  Administered 2024-10-12 – 2024-10-13 (×3): 650 mg via ORAL
  Filled 2024-10-12 (×4): qty 2

## 2024-10-12 MED ORDER — DEXTROSE-SODIUM CHLORIDE 5-0.9 % IV SOLN: INTRAVENOUS | Status: DC

## 2024-10-12 NOTE — Plan of Care (Signed)
  Problem: Coping: Goal: Level of anxiety will decrease Outcome: Progressing   Problem: Elimination: Goal: Will not experience complications related to bowel motility Outcome: Progressing Goal: Will not experience complications related to urinary retention Outcome: Progressing   Problem: Pain Managment: Goal: General experience of comfort will improve and/or be controlled Outcome: Progressing   Problem: Skin Integrity: Goal: Risk for impaired skin integrity will decrease Outcome: Progressing

## 2024-10-12 NOTE — Progress Notes (Signed)
 "  Javier [name]  FMW:[id number] DOB: 22-Nov-1940 DOA: 10/09/2024 PCP: [facility] [address]    Brief Narrative:  84 year old with a history of vascular dementia, CLL in remission, and HTN who presented to the ER 10/09/2024 with generalized abdominal pain and was found on imaging to have severe stercoral colitis.  He underwent disimpaction in the ER with resolution of his abdominal discomfort.  He was admitted for severe dehydration persisting constipation acute kidney injury and associated lactic acidosis.  Goals of Care:   Code Status: Full Code   DVT prophylaxis: enoxaparin  (LOVENOX ) injection 30 mg Start: 10/11/24 1600   Interim Hx: Tmax 99.3 last 24 hours.  Vital signs otherwise stable.  Alert and pleasant at the time of my evaluation.  No new complaints.  Reports continued poor oral intake.  Does not remember when he last had a bowel movement.  Denies feeling uncomfortable.  Assessment & Plan:  Stercoral colitis due to severe constipation Status post manual disimpaction at presentation -continue IV fluid support and bowel regimen  Acute kidney injury Prerenal in the setting of severe dehydration +/- a degree of ATN - remains IV fluid dependent - usual ARB on hold -baseline creatinine appears to be approximately 1.0 on review of last year's worth of lab checks  Metabolic acidosis due to AKI Has required bicarbonate infusion during this admission -bicarb now 20-21 therefore we will discontinue bicarbonate IV fluid and monitor  Hypoglycemia Due to very poor oral intake -has required support with dextrose  IV fluid  Lactic acidosis In the setting of severe colonic inflammation with stercoral colitis  HTN  Vascular dementia with failure to thrive Very minimal oral intake likely reflective of progressive dementia    Family Communication: Spoke with daughter at bedside Disposition:   Skilled Nursing-Short Term Rehab (<3 Hours/Day)10/10/2024 1300  Objective: Blood pressure 131/73,  pulse 83, temperature 98.8 F (37.1 C), resp. rate 16, SpO2 100%.  Intake/Output Summary (Last 24 hours) at 10/12/2024 1015 Last data filed at 10/12/2024 0700 Gross per 24 hour  Intake 1365 ml  Output 2375 ml  Net -1010 ml   There were no vitals filed for this visit.  Examination: General: No acute respiratory distress Lungs: Clear to auscultation bilaterally without wheezes or crackles Cardiovascular: Regular rate and rhythm without murmur gallop or rub normal S1 and S2 Abdomen: Nontender, nondistended, soft, bowel sounds positive, no rebound, no ascites, no appreciable mass Extremities: No significant cyanosis, clubbing, or edema bilateral lower extremities  CBC: Recent Labs  Lab 10/09/24 0835 10/10/24 0901 10/12/24 0533  WBC 5.7 9.6 5.5  NEUTROABS 4.7  --   --   HGB 10.7* 12.4* 9.7*  HCT 33.2* 37.5* 29.6*  MCV 95.7 92.8 93.1  PLT 153 157 107*   Basic Metabolic Panel: Recent Labs  Lab 10/09/24 0835 10/10/24 0901 10/11/24 0210 10/11/24 1921 10/12/24 0533  NA 142   < > 141 145 144  K 3.7   < > 4.4 3.1* 3.4*  CL 106   < > 109 112* 114*  CO2 19*   < > 18* 21* 20*  GLUCOSE 201*   < > 64* 156* 132*  BUN 19   < > 43* 41* 37*  CREATININE 1.54*   < > 2.10* 2.37* 2.39*  CALCIUM  9.7   < > 8.9 8.9 8.9  MG 3.7*  --  2.8*  --  2.7*  PHOS  --   --  3.8  --  2.5   < > = values in this  interval not displayed.   GFR: CrCl cannot be calculated (Unknown ideal weight.).   Scheduled Meds:  aspirin  EC  81 mg Oral Daily   Chlorhexidine  Gluconate Cloth  6 each Topical Daily   enoxaparin  (LOVENOX ) injection  30 mg Subcutaneous Q24H   feeding supplement  237 mL Oral TID BM   polyethylene glycol  17 g Oral BID   QUEtiapine   100 mg Oral QHS   rosuvastatin   40 mg Oral Daily   senna-docusate  1 tablet Oral QHS   sodium phosphate   1 enema Rectal Once   venlafaxine  XR  75 mg Oral Q breakfast   Continuous Infusions:  cefTRIAXone  (ROCEPHIN )  IV 2 g (10/12/24 0907)   dextrose  75  mL/hr at 10/12/24 0104   metronidazole  500 mg (10/11/24 2221)   sodium bicarbonate  150 mEq in dextrose  5 % 1,150 mL infusion Stopped (10/11/24 1400)     LOS: 3 days   [name], MD [facility] Office  [phone] Pager - Text Page per Tracey  If 7PM-7AM, please contact night-coverage per Amion 10/12/2024, 10:15 AM     "

## 2024-10-12 NOTE — Progress Notes (Signed)
 Physical Therapy Treatment Patient Details Name: Javier [name] MRN: [id number] DOB: 13-Oct-1940 Today's Date: 10/12/2024   History of Present Illness Pt is a 84 y.o. male who presented with abdominal pain and found to have severe stercoral colitis with disimpaction in ED.  Dehydration and hypotension and w/u for sepsis. PMHx of pre DM II, HLD, HTN, CLL, vascular dementia, remote lacunar stroke, BPH, gastric AVM with weight loss, remote tobacco abuse, anxiety, and Vit D deficiency    PT Comments  Pt resting in bed on arrival, sleeping but easily roused to voice and agreeable to session. Pt able to come to sitting EOB with min A and perform transfers sit<>stand and squat pivot transfer EOB<>BSC with mod-max A. Pt with noted flexed knees and hips in standing with pt unable to extend fully. Pt needing dense and direct cues for sequencing and initiation of all mobility as pt with poor problem solving, with pt able to follow all short simple commands/cues. Pt returning to supine with bed placed in partial chair position at end of session with NT present. Patient will benefit from continued inpatient follow up therapy, <3 hours/day. Pt continues to benefit from skilled PT services to progress toward functional mobility goals.     If plan is discharge home, recommend the following: Two people to help with walking and/or transfers;A lot of help with bathing/dressing/bathroom;Assistance with cooking/housework;Assist for transportation   Can travel by private vehicle     No  Equipment Recommendations  Other (comment) (TBA)    Recommendations for Other Services       Precautions / Restrictions Precautions Precautions: Fall Recall of Precautions/Restrictions: Impaired Precaution/Restrictions Comments: foley catheter Restrictions Weight Bearing Restrictions Per Provider Order: No     Mobility  Bed Mobility Overal bed mobility: Needs Assistance Bed Mobility: Supine to Sit, Sit to Supine      Supine to sit: Min assist, HOB elevated, Used rails Sit to supine: Min assist, HOB elevated, Used rails   General bed mobility comments: max cues for sequcning with min assist coming to EOB    Transfers Overall transfer level: Needs assistance Equipment used: Rolling walker (2 wheels), 1 person hand held assist, None Transfers: Sit to/from Stand, Bed to chair/wheelchair/BSC Sit to Stand: Mod assist     Squat pivot transfers: Mod assist, Max assist     General transfer comment: pt standing initialyl with RW for support with crouched psoture, unable to extend trunk to gain balance to step away from EOB. pt standing again from EOB without RW and able to squat pivot EOB>BSC with max directional cues as pt attempting to pivot hips away from [facility] initially. max A to squat pivot back to bed with low hip clearance. pt able to stand from EOB with HHA on L and take x2 steps along EOB to [facility]    Ambulation/Gait               General Gait Details: unable   Stairs             Wheelchair Mobility     Tilt Bed    Modified Rankin (Stroke Patients Only)       Balance Overall balance assessment: Needs assistance Sitting-balance support: Feet supported Sitting balance-Leahy Scale: Good     Standing balance support: Bilateral upper extremity supported, During functional activity Standing balance-Leahy Scale: Poor Standing balance comment: exteral assist to maintain balance  Communication Communication Communication: No apparent difficulties  Cognition Arousal: Lethargic, Alert Behavior During Therapy: Flat affect                           PT - Cognition Comments: pt sleeping on arrival but alerting to name, awake for session but closing eyes once returned to supine Following commands: Impaired Following commands impaired: Follows one step commands inconsistently    Cueing Cueing Techniques: Verbal cues  Exercises       General Comments General comments (skin integrity, edema, etc.): VSS on supplmental O2      Pertinent Vitals/Pain Pain Assessment Pain Assessment: Faces Faces Pain Scale: No hurt Pain Intervention(s): Monitored during session    Home Living                          Prior Function            PT Goals (current goals can now be found in the care plan section) Acute Rehab PT Goals Patient Stated Goal: to go to the bathroom PT Goal Formulation: Patient unable to participate in goal setting Time For Goal Achievement: 10/24/24 Progress towards PT goals: Progressing toward goals    Frequency    Min 2X/week      PT Plan      Co-evaluation              AM-PAC PT 6 Clicks Mobility   Outcome Measure  Help needed turning from your back to your side while in a flat bed without using bedrails?: A Little Help needed moving from lying on your back to sitting on the side of a flat bed without using bedrails?: A Little Help needed moving to and from a bed to a chair (including a wheelchair)?: A Lot Help needed standing up from a chair using your arms (e.g., wheelchair or bedside chair)?: A Lot Help needed to walk in hospital room?: Total Help needed climbing 3-5 steps with a railing? : Total 6 Click Score: 12    End of Session Equipment Utilized During Treatment: Gait belt Activity Tolerance: Patient limited by fatigue;Patient tolerated treatment well Patient left: in bed;with call bell/phone within reach;with bed alarm set;with restraints reapplied;with nursing/sitter in room (with mitt on L reapplied, NT in room and to apply R mitt when finished with blood sugar check) Nurse Communication: Mobility status;Need for lift equipment PT Visit Diagnosis: Unsteadiness on feet (R26.81);Muscle weakness (generalized) (M62.81);Pain Pain - part of body:  (catheter site)     Time: 8872-8853 PT Time Calculation (min) (ACUTE ONLY): 19 min  Charges:     $Therapeutic Activity: 8-22 mins PT General Charges $$ ACUTE PT VISIT: 1 Visit                     Jazper Nikolai R. PTA Acute Rehabilitation Services Office: [phone]   Therisa CHRISTELLA Boor 10/12/2024, 11:56 AM

## 2024-10-12 NOTE — TOC Progression Note (Signed)
 Transition of Care [facility]) - Progression Note    Patient Details  Name: Javier [name] MRN: [id number] Date of Birth: June 16, 1941  [facility]) CM/SW Contact  Bridget Cordella Simmonds, LCSW Phone Number: 10/12/2024, 11:58 AM  Clinical Narrative:   CSW spoke with pt [name] by phone, bed offers provided.  She is requesting response from [facility] or a higher rated facility.  CSW reached out to [facility], [place], [place].  Medicare choice document placed in pt room. Wife will return this afternoon.     Expected Discharge Plan: Skilled Nursing Facility Barriers to Discharge: Continued Medical Work up, English As A Second Language Teacher, SNF Pending bed offer               Expected Discharge Plan and Services       Living arrangements for the past 2 months: Single Family Home                                       Social Drivers of Health (SDOH) Interventions SDOH Screenings   Food Insecurity: Patient Unable To Answer (10/11/2024)  Housing: Patient Unable To Answer (10/11/2024)  Transportation Needs: Patient Unable To Answer (10/11/2024)  Utilities: Patient Unable To Answer (10/11/2024)  Alcohol Screen: Low Risk (05/05/2024)  Depression (PHQ2-9): Medium Risk (05/05/2024)  Financial Resource Strain: Low Risk (05/05/2024)  Physical Activity: Inactive (05/05/2024)  Social Connections: Patient Unable To Answer (10/11/2024)  Stress: Stress Concern Present (05/05/2024)  Tobacco Use: Medium Risk (09/13/2024)  Health Literacy: Adequate Health Literacy (05/05/2024)    Readmission Risk Interventions     No data to display

## 2024-10-12 NOTE — Progress Notes (Signed)
 Speech Language Pathology Treatment: Dysphagia  Patient Details Name: Javier [name] MRN: [id number] DOB: 1941-09-13 Today's Date: 10/12/2024 Time: 8551-8543 SLP Time Calculation (min) (ACUTE ONLY): 8 min  Assessment / Plan / Recommendation Clinical Impression  Given assistance for feeding, pt initiates oral transit quickly and clears his mouth completely. No coughing follows consecutive sips of thin liquids but belching frequently does. There also seems to be decreased instances of multiple swallows compared to initial evaluation. Will advance to regular solids and continue thin liquids, encouraging supervision with meals. SLP signing off at this time.    HPI HPI: Javier [name] is a 84 y.o. male with medical history significant of vascular dementia, CLL in remission, and HTN who p/w abd pain and found to have imaging c/f severe stercoral colitis s/p disimpaction and resolution of abd in ED. SLP consulted for clinical swallow assessment due to reported hx of nonspecific dysphagia per pt's wife.      SLP Plan  All goals met        Swallow Evaluation Recommendations   Recommendations: PO diet PO Diet Recommendation: Regular;Thin liquids (Level 0) Liquid Administration via: Cup;Straw Medication Administration: Whole meds with puree Supervision: Full assist for feeding;Full supervision/cueing for swallowing strategies Postural changes: Position pt fully upright for meals Oral care recommendations: Oral care BID (2x/day)     Recommendations                     Oral care BID   PRN Dysphagia, unspecified (R13.10)     All goals met     Damien Blumenthal, M.A., CCC-SLP Speech Language Pathology, Acute Rehabilitation Services  Secure Chat preferred [phone]   10/12/2024, 3:31 PM

## 2024-10-12 NOTE — Care Management Important Message (Signed)
 Important Message  Patient Details  Name: Javier [name] MRN: [id number] Date of Birth: 11-Jan-1941   Important Message Given:  Yes - Medicare IM     [name] 10/12/2024, 4:11 PM

## 2024-10-12 NOTE — Consult Note (Signed)
 "                                                  Palliative Care Consult Note                                  Date: 10/12/2024   Patient Name: Javier [name]  DOB:14-Jun-1941  FMW:[id number]  Age / Sex:84 y.o., male  PCP: [facility], [facility] Referring Physician: Danton Reyes DASEN, MD  Reason for Consultation: Establishing goals of care  Past Medical History:  Diagnosis Date   Diverticulosis    Hyperlipidemia    Hypertension    Memory loss    Prediabetes    Vitamin D  deficiency     Assessment & Plan:   HPI/Patient Profile: 84 y.o. male  with past medical history of vascular dementia, diverticulosis, GERD, HTN, CLL in remission admitted on 10/09/2024 with severe stercoral colitis, AMS, and AKI. Per H&P by Georgina MD on 10/09/2024 patient was disimpacted in ED which resolved abdominal pain. Patient admitted for management of dehydration, lactic acidosis, and AKI. CT A/P w/ contrast demonstrated thickening and inflammation of colon with fluid and large amount of fecal material and aortic atherosclerosis. CXR on 10/09/2024 demonstrated low lung volumes without acute cardiopulmonary abnormality. Cr remains elevated from baseline at 2.39 on 10/13/2023, persistent increase from initial Cr 1.54 on 10/09/2024 most likely due to poor PO intake.   Palliative medicine consulted for goals of care conversation.   Able to assist patient to sit at edge of bed, stand and pivot to a chair with a rolling walker. Congested cough and with warm skin to the touch concerning for possible pneumonia - primary team notified. Overall patient seems appropriate to pursue rehab and would most likely benefit, concern might be patient's ability to intake enough calories.  SUMMARY OF RECOMMENDATIONS   DNR-limited  Continue current management with goal of rehab and eventual return to home Awaiting AD and HCPOA documentation from family to scan into EMR  Symptom Management:  Per primary team  Code Status: DNR - Limited  (DNR/DNI)  Prognosis:  Unable to determine  Discharge Planning:  To Be Determined   Discussed with: Danton MD 10/12/2024 about discussion with family and decision to continue current measures with hopes to rehab and change in code status to DNR-limited.   Subjective:   Reviewed medical records, received report from team, assessed the patient and then meet at the patient's bedside to discuss diagnosis, prognosis, GOC, EOL wishes disposition and options.  I met with patient, wife Stevie) at bedside and daughters Amie and Margretta) joined by speaker phone.   We meet to discuss diagnosis prognosis, GOC, EOL wishes, disposition and options. Concept of Palliative Care was introduced as specialized medical care for people and their families living with serious illness.  If focuses on providing relief from the symptoms and stress of a serious illness.  The goal is to improve quality of life for both the patient and the family. Values and goals of care important to patient and family were attempted to be elicited.  Created space and opportunity for patient  and family to explore thoughts and feelings regarding current medical situation   Natural trajectory and current clinical status were discussed. Questions and concerns addressed. Patient encouraged to  call with questions or concerns.    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 - Patient enjoyed traveling with wife and has been to many places  Baseline Status: - Prior to admission patient was able to ambulate with minimal assistance and  perform most ADLs with assistance from his wife - Wife reports that they have a private caregiver through the [place] who assists 13 hours a day for 3 days a week, but even with that he has deteriorated and she has had trouble properly caring for him - Wife shared that she has noticed a deterioration over the last couple of months, but it became a lot more significant in the last week prior to admission due to his constipation - Wife shared that he has been eating less over the last couple of months and shared that the patient lost about 40 pounds but unable to give an approximate timeframe of weight loss, just knows that it has been gradual over the last months to years - Was able to assist patient to sit up at the side of the bed and stand with rolling walker and pivot, but it did take a significant amount of time and two attempts before patient was able to pivot from bed to chair  Today's Discussion: - Discussed with family the patient's overall health and functional status prior to admission, current admission, and discussed goals and plans for the next steps - Reviewed with family that overall his initial reason for admission is basically resolved but currently being monitored for his AKI and hypoglycemia due to his poor PO intake - Wife shares that it has been more difficult for her to care for him over the last weeks to months as his functional status has deteriorated even with the private caregiver assisting 3 days a week at 13 hours a day - Reviewed family's discussion and plan for discharge from hospital - Family shared from their understanding that the plan is for him to go to a rehab for short term and they are hopeful he can return home - Discussed code status with family and they share that they have discussed it before as a family and would like him to be DNR - Inquired about living will and wife shares she has that and will bring it in, shared with family that it is important for the medical  team to have that sort of information so that we provide care that will aligns with the values of the patient and family  Goals: - To get better enough to participate in rehab with eventual goal to return home  Review of Systems  Constitutional:  Positive for appetite change and fatigue.  Respiratory:  Positive for cough.     Objective:   Primary Diagnoses: Present on Admission:  Dehydration   Vital Signs:  BP 131/73 (BP Location: Right Arm)   Pulse 83   Temp 98.8 F (37.1 C)   Resp 16   SpO2 100%   Physical Exam HENT:     Head: Normocephalic and atraumatic.     Nose: Nose  normal.     Mouth/Throat:     Mouth: Mucous membranes are dry.  Eyes:     Extraocular Movements: Extraocular movements intact.     Conjunctiva/sclera: Conjunctivae normal.  Cardiovascular:     Rate and Rhythm: Normal rate.  Pulmonary:     Effort: Pulmonary effort is normal.     Comments: Congested cough Skin:    General: Skin is warm.     Findings: Erythema present.     Comments: Face erythematous from low grade fever  Neurological:     Mental Status: He is alert. Mental status is at baseline.  Psychiatric:        Mood and Affect: Mood normal.    Palliative Assessment/Data: 60%    Thank you for allowing us  to participate in the care of GAUDENCIO CHESNUT PMT will continue to support holistically.  I personally spent a total of 90 minutes in the care of the patient today including preparing to see the patient, getting/reviewing separately obtained history, performing a medically appropriate exam/evaluation, counseling and educating, placing orders, referring and communicating with other health care professionals, and documenting clinical information in the EHR.  Signed by: Fairy FORBES Shan DEVONNA Palliative Medicine Team  Team Phone # [phone] (Nights/Weekends)  10/12/2024, 8:32 AM   "

## 2024-10-13 DIAGNOSIS — E860 Dehydration: Secondary | ICD-10-CM | POA: Diagnosis not present

## 2024-10-13 LAB — COMPREHENSIVE METABOLIC PANEL WITH GFR
ALT: 363 U/L — ABNORMAL HIGH (ref 0–44)
AST: 610 U/L — ABNORMAL HIGH (ref 15–41)
Albumin: 2.8 g/dL — ABNORMAL LOW (ref 3.5–5.0)
Alkaline Phosphatase: 76 U/L (ref 38–126)
Anion gap: 12 (ref 5–15)
BUN: 30 mg/dL — ABNORMAL HIGH (ref 8–23)
CO2: 17 mmol/L — ABNORMAL LOW (ref 22–32)
Calcium: 9.5 mg/dL (ref 8.9–10.3)
Chloride: 118 mmol/L — ABNORMAL HIGH (ref 98–111)
Creatinine, Ser: 2.53 mg/dL — ABNORMAL HIGH (ref 0.61–1.24)
GFR, Estimated: 25 mL/min — ABNORMAL LOW
Glucose, Bld: 91 mg/dL (ref 70–99)
Potassium: 3.1 mmol/L — ABNORMAL LOW (ref 3.5–5.1)
Sodium: 147 mmol/L — ABNORMAL HIGH (ref 135–145)
Total Bilirubin: 0.5 mg/dL (ref 0.0–1.2)
Total Protein: 5.7 g/dL — ABNORMAL LOW (ref 6.5–8.1)

## 2024-10-13 LAB — GLUCOSE, CAPILLARY
Glucose-Capillary: 45 mg/dL — ABNORMAL LOW (ref 70–99)
Glucose-Capillary: 99 mg/dL (ref 70–99)
Glucose-Capillary: 98 mg/dL (ref 70–99)
Glucose-Capillary: 112 mg/dL — ABNORMAL HIGH (ref 70–99)
Glucose-Capillary: 104 mg/dL — ABNORMAL HIGH (ref 70–99)

## 2024-10-13 LAB — CBC
HCT: 30.1 % — ABNORMAL LOW (ref 39.0–52.0)
Hemoglobin: 9.7 g/dL — ABNORMAL LOW (ref 13.0–17.0)
MCH: 30.4 pg (ref 26.0–34.0)
MCHC: 32.2 g/dL (ref 30.0–36.0)
MCV: 94.4 fL (ref 80.0–100.0)
Platelets: 119 K/uL — ABNORMAL LOW (ref 150–400)
RBC: 3.19 MIL/uL — ABNORMAL LOW (ref 4.22–5.81)
RDW: 14.4 % (ref 11.5–15.5)
WBC: 4.2 K/uL (ref 4.0–10.5)
nRBC: 0 % (ref 0.0–0.2)

## 2024-10-13 MED ORDER — TAMSULOSIN HCL 0.4 MG PO CAPS
0.4000 mg | ORAL_CAPSULE | Freq: Every day | ORAL | Status: DC
  Administered 2024-10-13 – 2024-10-17 (×5): 0.4 mg via ORAL
  Filled 2024-10-13 (×5): qty 1

## 2024-10-13 MED ORDER — DEXTROSE-SODIUM CHLORIDE 5-0.45 % IV SOLN: INTRAVENOUS | Status: DC

## 2024-10-13 MED ORDER — SODIUM CHLORIDE 0.45 % IV BOLUS
500.0000 mL | Freq: Once | INTRAVENOUS | Status: AC
  Administered 2024-10-13: 500 mL via INTRAVENOUS

## 2024-10-13 MED ORDER — POTASSIUM CHLORIDE 20 MEQ PO PACK
20.0000 meq | PACK | Freq: Two times a day (BID) | ORAL | Status: DC
  Administered 2024-10-13 – 2024-10-15 (×5): 20 meq via ORAL
  Filled 2024-10-13 (×6): qty 1

## 2024-10-13 MED ORDER — QUETIAPINE FUMARATE 25 MG PO TABS
50.0000 mg | ORAL_TABLET | Freq: Every day | ORAL | Status: DC
  Administered 2024-10-14 – 2024-10-16 (×3): 50 mg via ORAL
  Filled 2024-10-13 (×3): qty 2

## 2024-10-13 NOTE — Plan of Care (Signed)

## 2024-10-13 NOTE — Progress Notes (Signed)
 Mobility Specialist Progress Note:   10/13/24 0929  Mobility  Activity Pivoted/transferred from bed to chair  Level of Assistance Maximum assist, patient does 25-49% (+2)  Assistive Device Other (Comment) (HHA)  Distance Ambulated (ft) 5 ft  Activity Response Tolerated well  Mobility Referral Yes  Mobility visit 1 Mobility  Mobility Specialist Start Time (ACUTE ONLY) 0912  Mobility Specialist Stop Time (ACUTE ONLY) 0929  Mobility Specialist Time Calculation (min) (ACUTE ONLY) 17 min   Received pt in bed and agreeable to mobility. Pt required MaxA +2 w/ VC and assisting pt to take steps. Upon standing, pt had BM. MS assisted with pericare. Left pt in chair with alarm on. Personal belongings and call light within reach. All needs met.  Lavanda Pollack Mobility Specialist  Please contact via [employer] or  Rehab Office [phone]

## 2024-10-13 NOTE — Progress Notes (Signed)
 " Daily Progress Note   Date: 10/13/2024   Patient Name: Javier [name]  DOB: 06-Apr-1941  MRN: [id number]  Age / Sex: 84 y.o., male  Attending Physician: Javier Reyes DASEN, [name] Primary Care Physician: [facility], [facility] Admit Date: 10/09/2024 Length of Stay: 4 days  Reason for Follow-up: Establishing goals of care  Past Medical History:  Diagnosis Date   Diverticulosis    Hyperlipidemia    Hypertension    Memory loss    Prediabetes    Vitamin D  deficiency     Assessment & Plan:   HPI/Patient Profile:   84 y.o. male  with past medical history of vascular dementia, diverticulosis, GERD, HTN, CLL in remission admitted on 10/09/2024 with severe stercoral colitis, AMS, and AKI. Per H&P by Javier [name] on 10/09/2024 patient was disimpacted in ED which resolved abdominal pain. Patient admitted for management of dehydration, lactic acidosis, and AKI. CT A/P w/ contrast demonstrated thickening and inflammation of colon with fluid and large amount of fecal material and aortic atherosclerosis. CXR on 10/09/2024 demonstrated low lung volumes without acute cardiopulmonary abnormality. Cr remains elevated from baseline at 2.39 on 10/13/2023, persistent increase from initial Cr 1.54 on 10/09/2024 most likely due to poor PO intake.    Palliative medicine consulted for goals of care conversation.    Overall patient seems appropriate to pursue rehab and would most likely benefit, concern might be patient's ability to intake enough calories. Patient continues on dextrose  infusion for low blood glucose due to poor PO intake, this will be a barrier to successful rehab.   SUMMARY OF RECOMMENDATIONS DNR-limited Continue current management with goal of rehab and return to home when able HCPOA and AD copies made and placed in chart, DNR signed in chart, pending scanning into EMR Palliative team will continue to monitor AD states I desire that my life not be prolonged by life-sustaining procedures if I am terminally  ill, permanently in a coma, suffer severe dementia, or am in a persistent vegetative state HCPOA documentation names wife - Javier [name] as primary and daughter - Javier [name] as secondary  Symptom Management:  Per primary team  Code Status: DNR - Limited (DNR/DNI)  Prognosis: Unable to determine  Discharge Planning: Short term rehab  Subjective:   Subjective: Chart Reviewed. Updates received. Patient Assessed. Created space and opportunity for patient  and family to explore thoughts and feelings regarding current medical situation.  Spoke with primary RN who shares patient continues to have poor PO intake but remains comfortable at this time.   Today's Discussion:  Met with the patient and wife at bedside. Patient comfortable in bed without any complaints. Awakes and pleasant but hard to understand when speaking. Wife inquired about foley catheter removal and shared with her that the order has been place and nurse will be present shortly to remove it. Wife also shared that she is in the process of looking for a facility that she will like for the patient to discharge to but the one she visited today was not up to her standard. Inquired wife if the patient ate much today and shared that he ate some. Reviewed with her my concern for the patient that he requires a dextrose  infusion to maintain adequate blood sugars and that I am concerned he may not intake enough at the rehab facility to adequately regain strength enough to return home.   Review of Systems  Unable to perform ROS   Objective:   Primary Diagnoses: Present on Admission:  Dehydration   Vital Signs:  BP (!) 169/66 (BP Location: Right Arm)   Pulse 82   Temp 98.7 F (37.1 C)   Resp 18   SpO2 100%   Physical Exam Constitutional:      Comments: Somnolent but awakes to voice  HENT:     Head: Normocephalic and atraumatic.     Nose: Nose normal.     Mouth/Throat:     Mouth: Mucous membranes are dry.  Eyes:      Extraocular Movements: Extraocular movements intact.  Cardiovascular:     Rate and Rhythm: Normal rate.  Pulmonary:     Effort: Pulmonary effort is normal.     Comments: Room air Neurological:     Mental Status: Mental status is at baseline.    Palliative Assessment/Data: 50%   Existing Vynca/ACP Documentation: HCPOA and AD pending scan into EMR  Thank you for allowing us  to participate in the care of Javier [name] PMT will continue to support holistically.  I personally spent a total of 35 minutes in the care of the patient today including preparing to see the patient, performing a medically appropriate exam/evaluation, counseling and educating, and documenting clinical information in the EHR.  Javier [name]  Palliative Medicine Team  Team Phone # [phone] (Nights/Weekends) 10/13/2024 3:26 PM  "

## 2024-10-13 NOTE — Progress Notes (Signed)
 OT Cancellation Note  Patient Details Name: Javier [name] MRN: [id number] DOB: Nov 20, 1940   Cancelled Treatment:    Reason Eval/Treat Not Completed: (P) Fatigue/lethargy limiting ability to participate. Pt given morphine , B mittens on for safety, lethargic, in/out of sleep, will return tomorrow  Elouise [name] 10/13/2024, 4:31 PM

## 2024-10-13 NOTE — TOC Progression Note (Signed)
 Transition of Care [facility]) - Progression Note    Patient Details  Name: Javier [name] MRN: [id number] Date of Birth: 1941-02-16  [facility] CM/SW Contact  Bridget Cordella Simmonds, LCSW Phone Number: 10/13/2024, 2:49 PM  Clinical Narrative:   CSW received message from [facility]: they are not able to offer bed.  1300: CSW spoke with pt wife [name], she reports she is still reviewing the current offers and is not ready to make a decision on SNF.     Expected Discharge Plan: Skilled Nursing Facility Barriers to Discharge: Continued Medical Work up, English As A Second Language Teacher, SNF Pending bed offer               Expected Discharge Plan and Services       Living arrangements for the past 2 months: Single Family Home                                       Social Drivers of Health (SDOH) Interventions SDOH Screenings   Food Insecurity: Patient Unable To Answer (10/11/2024)  Housing: Patient Unable To Answer (10/11/2024)  Transportation Needs: Patient Unable To Answer (10/11/2024)  Utilities: Patient Unable To Answer (10/11/2024)  Alcohol Screen: Low Risk (05/05/2024)  Depression (PHQ2-9): Medium Risk (05/05/2024)  Financial Resource Strain: Low Risk (05/05/2024)  Physical Activity: Inactive (05/05/2024)  Social Connections: Patient Unable To Answer (10/11/2024)  Stress: Stress Concern Present (05/05/2024)  Tobacco Use: Medium Risk (09/13/2024)  Health Literacy: Adequate Health Literacy (05/05/2024)    Readmission Risk Interventions     No data to display

## 2024-10-13 NOTE — Progress Notes (Signed)
 "  Javier [name]  FMW:[id number] DOB: 18-Jun-1941 DOA: 10/09/2024 PCP: [facility], [address]    Brief Narrative:  84 year old with a history of vascular dementia, CLL in remission, and HTN who presented to the ER 10/09/2024 with generalized abdominal pain and was found on imaging to have severe stercoral colitis.  He underwent disimpaction in the ER with resolution of his abdominal discomfort.  He was admitted for severe dehydration persisting constipation acute kidney injury and associated lactic acidosis.  Goals of Care:   Code Status: Limited: Do not attempt resuscitation (DNR) -DNR-LIMITED -Do Not Intubate/DNI    DVT prophylaxis: enoxaparin  (LOVENOX ) injection 30 mg Start: 10/11/24 1600   Interim Hx: No acute events recorded overnight.  Afebrile.  Vital signs stable.  Experienced 1 drop in CBG this morning to 45 at 0800.  Potassium low at 3.1 with sodium elevated at 147.  Resting comfortably in bed.  Family tells me he was able to eat a little bit more this morning.  No new complaints today.  Assessment & Plan:  Stercoral colitis due to severe constipation Status post manual disimpaction at presentation - continue IV fluid support and bowel regimen  Acute kidney injury Prerenal in the setting of severe dehydration +/- a degree of ATN - remains IV fluid dependent - usual ARB on hold - baseline creatinine appears to be approximately 1.0 on review of last year's worth of lab checks - crt continues to slowly increase - follow trend - cont to hydrate gently   Metabolic acidosis due to AKI Has required bicarbonate infusion during this admission - IV bicarbonate discontinued when bicarb increased to 20-21 -trending downward again now - follow  Hypernatremia Increase free water and IV fluid - continue to encourage oral intake  Hypoglycemia Due to very poor oral intake - has required support with dextrose  IV fluid - monitor - cont to push oral intake   Lactic acidosis In the setting of severe  colonic inflammation with stercoral colitis  HTN No indication for aggressive blood pressure control in this clinical setting -monitor and avoid extremes   Vascular dementia with failure to thrive Very minimal oral intake likely reflective of progressive dementia   Family Communication: Spoke with family at bedside and via phone Disposition:   Skilled Nursing-Short Term Rehab (<3 Hours/Day)10/12/2024 1100  Objective: Blood pressure (!) 169/66, pulse 82, temperature 98.7 F (37.1 C), resp. rate 18, SpO2 100%.  Intake/Output Summary (Last 24 hours) at 10/13/2024 0923 Last data filed at 10/13/2024 0900 Gross per 24 hour  Intake 240 ml  Output 1750 ml  Net -1510 ml   There were no vitals filed for this visit.  Examination: General: No acute respiratory distress Lungs: Clear to auscultation bilaterally without wheezes or crackles Cardiovascular: RRR without murmur Abdomen: Thin, soft, bowel sounds positive, no mass or rebound Extremities: No significant cyanosis, clubbing, or edema bilateral lower extremities  CBC: Recent Labs  Lab 10/09/24 0835 10/10/24 0901 10/12/24 0533 10/13/24 0541  WBC 5.7 9.6 5.5 4.2  NEUTROABS 4.7  --   --   --   HGB 10.7* 12.4* 9.7* 9.7*  HCT 33.2* 37.5* 29.6* 30.1*  MCV 95.7 92.8 93.1 94.4  PLT 153 157 107* 119*   Basic Metabolic Panel: Recent Labs  Lab 10/09/24 0835 10/10/24 0901 10/11/24 0210 10/11/24 1921 10/12/24 0533 10/13/24 0541  NA 142   < > 141 145 144 147*  K 3.7   < > 4.4 3.1* 3.4* 3.1*  CL 106   < >  109 112* 114* 118*  CO2 19*   < > 18* 21* 20* 17*  GLUCOSE 201*   < > 64* 156* 132* 91  BUN 19   < > 43* 41* 37* 30*  CREATININE 1.54*   < > 2.10* 2.37* 2.39* 2.53*  CALCIUM  9.7   < > 8.9 8.9 8.9 9.5  MG 3.7*  --  2.8*  --  2.7*  --   PHOS  --   --  3.8  --  2.5  --    < > = values in this interval not displayed.   GFR: CrCl cannot be calculated (Unknown ideal weight.).   Scheduled Meds:  aspirin  EC  81 mg Oral Daily    Chlorhexidine  Gluconate Cloth  6 each Topical Daily   enoxaparin  (LOVENOX ) injection  30 mg Subcutaneous Q24H   feeding supplement  237 mL Oral TID BM   polyethylene glycol  17 g Oral BID   QUEtiapine   100 mg Oral QHS   rosuvastatin   40 mg Oral Daily   senna-docusate  1 tablet Oral QHS   sodium phosphate   1 enema Rectal Once   traZODone   100 mg Oral QHS   venlafaxine  XR  75 mg Oral Q breakfast   Continuous Infusions:  dextrose  5 % and 0.9 % NaCl 60 mL/hr at 10/12/24 2132     LOS: 4 days   [name], MD [facility] Office  [phone] Pager - Text Page per Tracey  If 7PM-7AM, please contact night-coverage per Amion 10/13/2024, 9:23 AM     "

## 2024-10-13 NOTE — Plan of Care (Signed)
   Problem: Education: Goal: Knowledge of General Education information will improve Description Including pain rating scale, medication(s)/side effects and non-pharmacologic comfort measures Outcome: Progressing   Problem: Health Behavior/Discharge Planning: Goal: Ability to manage health-related needs will improve Outcome: Progressing

## 2024-10-14 DIAGNOSIS — E43 Unspecified severe protein-calorie malnutrition: Secondary | ICD-10-CM | POA: Insufficient documentation

## 2024-10-14 DIAGNOSIS — E860 Dehydration: Secondary | ICD-10-CM | POA: Diagnosis not present

## 2024-10-14 LAB — CULTURE, BLOOD (ROUTINE X 2)
Culture: NO GROWTH
Culture: NO GROWTH

## 2024-10-14 LAB — GLUCOSE, CAPILLARY
Glucose-Capillary: 107 mg/dL — ABNORMAL HIGH (ref 70–99)
Glucose-Capillary: 109 mg/dL — ABNORMAL HIGH (ref 70–99)
Glucose-Capillary: 109 mg/dL — ABNORMAL HIGH (ref 70–99)
Glucose-Capillary: 111 mg/dL — ABNORMAL HIGH (ref 70–99)
Glucose-Capillary: 93 mg/dL (ref 70–99)
Glucose-Capillary: 94 mg/dL (ref 70–99)

## 2024-10-14 LAB — BASIC METABOLIC PANEL WITH GFR
Anion gap: 11 (ref 5–15)
BUN: 27 mg/dL — ABNORMAL HIGH (ref 8–23)
CO2: 18 mmol/L — ABNORMAL LOW (ref 22–32)
Calcium: 9.5 mg/dL (ref 8.9–10.3)
Chloride: 119 mmol/L — ABNORMAL HIGH (ref 98–111)
Creatinine, Ser: 2.37 mg/dL — ABNORMAL HIGH (ref 0.61–1.24)
GFR, Estimated: 27 mL/min — ABNORMAL LOW
Glucose, Bld: 105 mg/dL — ABNORMAL HIGH (ref 70–99)
Potassium: 3.2 mmol/L — ABNORMAL LOW (ref 3.5–5.1)
Sodium: 148 mmol/L — ABNORMAL HIGH (ref 135–145)

## 2024-10-14 LAB — MAGNESIUM: Magnesium: 2.4 mg/dL (ref 1.7–2.4)

## 2024-10-14 MED ORDER — MIRTAZAPINE 15 MG PO TABS
15.0000 mg | ORAL_TABLET | Freq: Every day | ORAL | Status: DC
  Administered 2024-10-14 – 2024-10-16 (×3): 15 mg via ORAL
  Filled 2024-10-14 (×3): qty 1

## 2024-10-14 MED ORDER — POTASSIUM CL IN DEXTROSE 5% 20 MEQ/L IV SOLN
20.0000 meq | INTRAVENOUS | Status: DC
  Administered 2024-10-14 – 2024-10-15 (×2): 20 meq via INTRAVENOUS
  Filled 2024-10-14 (×2): qty 1000

## 2024-10-14 MED ORDER — THIAMINE MONONITRATE 100 MG PO TABS
100.0000 mg | ORAL_TABLET | Freq: Every day | ORAL | Status: DC
  Administered 2024-10-14 – 2024-10-17 (×4): 100 mg via ORAL
  Filled 2024-10-14 (×4): qty 1

## 2024-10-14 MED ORDER — ADULT MULTIVITAMIN W/MINERALS CH
1.0000 | ORAL_TABLET | Freq: Every day | ORAL | Status: DC
  Administered 2024-10-14 – 2024-10-17 (×4): 1 via ORAL
  Filled 2024-10-14 (×4): qty 1

## 2024-10-14 MED ORDER — LACTATED RINGERS IV BOLUS
500.0000 mL | Freq: Once | INTRAVENOUS | Status: AC
  Administered 2024-10-14: 500 mL via INTRAVENOUS

## 2024-10-14 NOTE — Progress Notes (Signed)
 "  Javier [name]  FMW:[id number] DOB: [birth year] DOA: 10/09/2024 PCP: [facility]    Brief Narrative:  84 year old with a history of vascular dementia, CLL in remission, and HTN who presented to the ER 10/09/2024 with generalized abdominal pain and was found on imaging to have severe stercoral colitis.  He underwent disimpaction in the ER with resolution of his abdominal discomfort.  He was admitted for severe dehydration persisting constipation acute kidney injury and associated lactic acidosis.  Goals of Care:   Code Status: Limited: Do not attempt resuscitation (DNR) -DNR-LIMITED -Do Not Intubate/DNI    DVT prophylaxis: enoxaparin  (LOVENOX ) injection 30 mg Start: 10/11/24 1600   Interim Hx: Afebrile.  Vital signs stable.  No acute events recorded overnight. Much more alert and conversant today. Denies any new complaints.   Assessment & Plan:  Stercoral colitis due to severe constipation Status post manual disimpaction at presentation - continue IV fluid support and bowel regimen  Acute kidney injury Prerenal in the setting of severe dehydration +/- a degree of ATN - remains IV fluid dependent with poor oral intake - usual ARB discontinued- baseline creatinine appears to be approximately 1.0 on review of last year's worth of lab checks - crt now improving with ongoing volume support  Metabolic acidosis due to AKI Has required bicarbonate infusion during this admission - IV bicarbonate discontinued when bicarb increased to 20-21 -stabilizing with ongoing volume support and subsequent improvement in renal function  Hypernatremia Increase free water in IV fluid - continue to encourage oral intake  Hypoglycemia Due to very poor oral intake - has required support with dextrose  IV fluid - monitor - cont to push oral intake   Lactic acidosis In the setting of severe colonic inflammation with stercoral colitis  HTN No indication for aggressive blood pressure control in this  clinical setting - monitor and avoid extremes   Vascular dementia with failure to thrive Very minimal oral intake likely reflective of progressive dementia - give trial of remeron     Family Communication: Spoke with family at bedside  Disposition:   Skilled Nursing-Short Term Rehab (<3 Hours/Day)10/12/2024 1100 - anticipate d/c in 48hrs   Objective: Blood pressure (!) 160/56, pulse 82, temperature 97.6 F (36.4 C), resp. rate 16, weight 68.3 kg, SpO2 100%.  Intake/Output Summary (Last 24 hours) at 10/14/2024 0946 Last data filed at 10/14/2024 0700 Gross per 24 hour  Intake 552.5 ml  Output 1000 ml  Net -447.5 ml   Filed Weights   10/14/24 0944  Weight: 68.3 kg    Examination: General: No acute respiratory distress Lungs: Clear to auscultation bilaterally  Cardiovascular: RRR without murmur Abdomen: Thin, soft, bowel sounds positive, no mass or rebound Extremities: No significant edema bilateral lower extremities  CBC: Recent Labs  Lab 10/09/24 0835 10/10/24 0901 10/12/24 0533 10/13/24 0541  WBC 5.7 9.6 5.5 4.2  NEUTROABS 4.7  --   --   --   HGB 10.7* 12.4* 9.7* 9.7*  HCT 33.2* 37.5* 29.6* 30.1*  MCV 95.7 92.8 93.1 94.4  PLT 153 157 107* 119*   Basic Metabolic Panel: Recent Labs  Lab 10/11/24 0210 10/11/24 1921 10/12/24 0533 10/13/24 0541 10/14/24 0502  NA 141   < > 144 147* 148*  K 4.4   < > 3.4* 3.1* 3.2*  CL 109   < > 114* 118* 119*  CO2 18*   < > 20* 17* 18*  GLUCOSE 64*   < > 132* 91 105*  BUN 43*   < >  37* 30* 27*  CREATININE 2.10*   < > 2.39* 2.53* 2.37*  CALCIUM  8.9   < > 8.9 9.5 9.5  MG 2.8*  --  2.7*  --  2.4  PHOS 3.8  --  2.5  --   --    < > = values in this interval not displayed.   GFR: Estimated Creatinine Clearance: 22.8 mL/min (A) (by C-G formula based on SCr of 2.37 mg/dL (H)).   Scheduled Meds:  aspirin  EC  81 mg Oral Daily   Chlorhexidine  Gluconate Cloth  6 each Topical Daily   enoxaparin  (LOVENOX ) injection  30 mg Subcutaneous  Q24H   feeding supplement  237 mL Oral TID BM   polyethylene glycol  17 g Oral BID   potassium chloride   20 mEq Oral BID   QUEtiapine   50 mg Oral QHS   senna-docusate  1 tablet Oral QHS   tamsulosin   0.4 mg Oral Daily   traZODone   100 mg Oral QHS   venlafaxine  XR  75 mg Oral Q breakfast   Continuous Infusions:  dextrose  5 % and 0.45 % NaCl 75 mL/hr at 10/14/24 0426     LOS: 5 days   Javier [name] Moores, MD [facility] Office  [phone] Pager - Text Page per Tracey  If 7PM-7AM, please contact night-coverage per Amion 10/14/2024, 9:46 AM     "

## 2024-10-14 NOTE — TOC Progression Note (Addendum)
 Transition of Care [facility]) - Progression Note    Patient Details  Name: Javier [name] MRN: [id number] Date of Birth: 19-Jul-1941  [facility]) CM/SW Contact  Bridget Cordella Simmonds, LCSW Phone Number: 10/14/2024, 10:35 AM  Clinical Narrative:   CSW spoke with [name] about SNF choice, she is quite upset regarding low rated SNF offers being his only option.  She has asked the [facility] for help, they told her to try [facility], CSW informed her that [facility] has declined.  She is asking for specific reasons as to why pt was declined, shared behavioral concerns, wife more upset with that information.  Discussed if she prefers to have pt DC home, she states she knows she cannot take care of him at home.  CSW will send referral out to facilities farther away that have higher ratings.  1150: [facility] does offer bed, CSW spoke with pt wife, she does want to accept this offer.  Private room not available until Monday, wife aware and OK with this.  1220: pt not showing active in [facility] portal, CSW confirmed with pt wife that pt remains with [facility] for 2026.  Message sent to Tanya/[facility] to start insurance auth directly with [facility].   Expected Discharge Plan: Skilled Nursing Facility Barriers to Discharge: Continued Medical Work up, English As A Second Language Teacher, SNF Pending bed offer               Expected Discharge Plan and Services       Living arrangements for the past 2 months: Single Family Home                                       Social Drivers of Health (SDOH) Interventions SDOH Screenings   Food Insecurity: Patient Unable To Answer (10/11/2024)  Housing: Patient Unable To Answer (10/11/2024)  Transportation Needs: Patient Unable To Answer (10/11/2024)  Utilities: Patient Unable To Answer (10/11/2024)  Alcohol Screen: Low Risk (05/05/2024)  Depression (PHQ2-9): Medium Risk (05/05/2024)  Financial Resource Strain: Low Risk (05/05/2024)  Physical Activity: Inactive (05/05/2024)   Social Connections: Patient Unable To Answer (10/11/2024)  Stress: Stress Concern Present (05/05/2024)  Tobacco Use: Medium Risk (09/13/2024)  Health Literacy: Adequate Health Literacy (05/05/2024)    Readmission Risk Interventions     No data to display

## 2024-10-14 NOTE — Plan of Care (Signed)

## 2024-10-14 NOTE — Progress Notes (Addendum)
 Initial Nutrition Assessment  DOCUMENTATION CODES:   Severe malnutrition in context of chronic illness  INTERVENTION:   Encourage po intake Automatic meal trays Nursing to assist during meals  Ensure Plus High Protein po TID, each supplement provides 350 kcal and 20 grams of protein Mighty Shake TID with meals, each supplement provides 330 kcals and 9 grams of protein Magic cup TID with meals, each supplement provides 290 kcal and 9 grams of protein MVI with minerals daily 100 mg Thiamine  daily  NUTRITION DIAGNOSIS:   Severe Malnutrition related to chronic illness as evidenced by severe muscle depletion, severe fat depletion, energy intake < 75% for > or equal to 1 month.  GOAL:   Patient will meet greater than or equal to 90% of their needs  MONITOR:   PO intake, Supplement acceptance, Labs, Weight trends  REASON FOR ASSESSMENT:   Malnutrition Screening Tool    ASSESSMENT:   84 year old with hx of vascular dementia, CLL in remission, HLD, HTN, diverticulosis, who presented with generalized abdominal pain and was found  to have severe stercoral colitis s/p disimpaction. Admitted for severe dehydration persisting constipation acute kidney injury and associated lactic acidosis.  Pt resting in bed is a poor historian d/t to his dementia, no family bedside. History obtained through chart review.   PTA pt was able to ambulate with minimal assistance and perform most ADLs with assistance from his wife. Over the last couple of months pt has had a decline in his functional status that has worsened over the last week prior to admission d/t his constipation. Pt has had decreased po intake and appetite over the last couple of month. Per conversation with wife pt  has lost around 40 lbs within the last couple months to a year.   Reviewed weight history and pt has lost 5 lbs in the last year however most recent weight is a bed weight. Pt appears to be less then current weight. Exam with  severe muscle and fat depletions. Pt with poor po intake since admission requiring dextrose  infusion for low CBG's. Per nursing pt refused dinner last night and only had a couple bites for breakfast this morning. Pt does do well with liquids and drinks 50-75% of one before he gets tired.   Pt meets criteria for severe malnutrition. Per GOC family and pt wish to go to rehab for short term and then wants to return home however, unsure if pt will be able to tolerate rehab with such poor po intake. Encouraged pt to increase po intake, pt willing to try mighty shakes and magic cups. Nursing to assist with meals. Add MVI/Thiamine . Discussed with MD and palliative, no plan for nutrition support/artifical nutrition at this time. Optimize intake with ONS.  Admit weight: 68.3 kg Current weight: 68.3 kg  Wt Readings from Last 10 Encounters:  10/14/24 68.3 kg  09/13/24 65.8 kg  05/10/24 66.7 kg  05/05/24 68.5 kg  03/01/24 68.5 kg  10/26/23 70.8 kg  10/24/23 67.4 kg  07/14/23 67.4 kg  05/05/23 65.8 kg  03/26/23 62.7 kg    Average Meal Intake: 1/12-1/16: 8% intake x 6 recorded meals  Nutritionally Relevant Medications: Scheduled Meds:  feeding supplement  237 mL Oral TID BM   mirtazapine   15 mg Oral QHS   multivitamin with minerals  1 tablet Oral Daily   polyethylene glycol  17 g Oral BID   potassium chloride   20 mEq Oral BID   senna-docusate  1 tablet Oral QHS   tamsulosin   0.4 mg Oral Daily   thiamine   100 mg Oral Daily   Continuous Infusions:  dextrose  5 % with KCl 20 mEq / L 20 mEq (10/14/24 1118)   Labs Reviewed: Sodium 148 Potassium 3.2 BUN 27 Creatinine 2.37 CBG ranges from 93-111 mg/dL over the last 24 hours HgbA1c 5.3  NUTRITION - FOCUSED PHYSICAL EXAM:  Flowsheet Row Most Recent Value  Orbital Region Severe depletion  Upper Arm Region Severe depletion  Thoracic and Lumbar Region Severe depletion  Buccal Region Severe depletion  Temple Region Severe depletion  Clavicle  Bone Region Severe depletion  Clavicle and Acromion Bone Region Severe depletion  Scapular Bone Region Severe depletion  Dorsal Hand Severe depletion  Patellar Region Severe depletion  Anterior Thigh Region Severe depletion  Posterior Calf Region Severe depletion  Edema (RD Assessment) None  Hair Reviewed  Eyes Reviewed  Mouth Reviewed  Skin Reviewed  Nails Reviewed    Diet Order:   Diet Order             Diet regular Room service appropriate? No; Fluid consistency: Thin  Diet effective now                   EDUCATION NEEDS:   Not appropriate for education at this time  Skin:  Skin Assessment: Reviewed RN Assessment  Last BM:  1/15  Height:   Ht Readings from Last 1 Encounters:  09/13/24 5' 11 (1.803 m)    Weight:   Wt Readings from Last 1 Encounters:  10/14/24 68.3 kg    Ideal Body Weight:  78.2 kg  BMI:  Body mass index is 21 kg/m.  Estimated Nutritional Needs:   Kcal:  1700-1900 kcal  Protein:  80-90 gm  Fluid:  >1.7L/day   Olivia Kenning, RD Registered Dietitian  See Amion for more information

## 2024-10-14 NOTE — Progress Notes (Signed)
 " Daily Progress Note   Date: 10/14/2024   Patient Name: Javier [name]  DOB: 10/09/1940  MRN: [id number]  Age / Sex: 84 y.o., male  Attending Physician: Danton Reyes DASEN, MD Primary Care Physician: [facility] Admit Date: 10/09/2024 Length of Stay: 5 days  Reason for Follow-up: Establishing goals of care  Past Medical History:  Diagnosis Date   Diverticulosis    Hyperlipidemia    Hypertension    Memory loss    Prediabetes    Vitamin D  deficiency     Assessment & Plan:   HPI/Patient Profile:   84 y.o. male  with past medical history of vascular dementia, diverticulosis, GERD, HTN, CLL in remission admitted on 10/09/2024 with severe stercoral colitis, AMS, and AKI. Per H&P by Georgina MD on 10/09/2024 patient was disimpacted in ED which resolved abdominal pain. Patient admitted for management of dehydration, lactic acidosis, and AKI. CT A/P w/ contrast demonstrated thickening and inflammation of colon with fluid and large amount of fecal material and aortic atherosclerosis. CXR on 10/09/2024 demonstrated low lung volumes without acute cardiopulmonary abnormality. Cr remains elevated from baseline at 2.39 on 10/13/2023, persistent increase from initial Cr 1.54 on 10/09/2024 most likely due to poor PO intake.    Palliative medicine consulted for goals of care conversation.    Overall patient seems appropriate to pursue rehab and would most likely benefit, concern might be patient's ability to intake enough calories. Dextrose  infusion continues, blood glucose remains stable in the last 24 hours. PO intake remains limited.   SUMMARY OF RECOMMENDATIONS DNR limited Continue current management with goal of rehab and return to home HCPOA and AD placed in chart, pending scanning into EMR Palliative medicine team will continue to follow Primary HCPOA is Fern Asmar (wife) and secondary HCPOA is Public Affairs Consultant Beverlee)  Symptom Management:  Per primary team  Code Status: DNR - Limited  (DNR/DNI)  Prognosis: Unable to determine  Discharge Planning: To Be Determined  Discussed with: Danton MD 10/14/2024 about discussion with dietitian's query on feeding tube given poor PO intake, will not offer feeding tube, but will need to have further goals of care conversation regarding poor PO intake and high likelihood for deterioration.   Subjective:   Subjective: Chart Reviewed. Updates received. Patient Assessed. Created space and opportunity for patient  and family to explore thoughts and feelings regarding current medical situation.  Today's Discussion:  Met with the patient at the bedside without any visitors present. Patient was found undressed and assisted him with placing gown back on. Patient was confused and attempting to remove his IV, redirected patient and able to place gown back on him. Patient requested some water and was able to drink 200 mL of water without any signs of aspiration. Inquired if patient knows current care plan. Briefly reviewed with patient the current plan to discharge him to rehab.   Review of Systems  All other systems reviewed and are negative.   Objective:   Primary Diagnoses: Present on Admission:  Dehydration   Vital Signs:  BP (!) 171/86 (BP Location: Left Arm)   Pulse 79   Temp 99.8 F (37.7 C) (Oral)   Resp 16   Wt 68.3 kg Comment: Bed weight  SpO2 100%   BMI 21.00 kg/m   Physical Exam HENT:     Head: Normocephalic and atraumatic.     Nose: Nose normal.     Mouth/Throat:     Mouth: Mucous membranes are dry.  Eyes:  Extraocular Movements: Extraocular movements intact.  Cardiovascular:     Rate and Rhythm: Normal rate.  Pulmonary:     Effort: Pulmonary effort is normal.  Skin:    General: Skin is warm and dry.  Neurological:     Mental Status: He is alert. He is disoriented.    Palliative Assessment/Data: 40%   Existing Vynca/ACP Documentation: HCPOA and AD pending scanning into EMR  Thank you for allowing  us  to participate in the care of TAITEN BRAWN PMT will continue to support holistically.  I personally spent a total of 35 minutes in the care of the patient today including preparing to see the patient, performing a medically appropriate exam/evaluation, counseling and educating, referring and communicating with other health care professionals, and documenting clinical information in the EHR.   Fairy FORBES Shan DEVONNA  Palliative Medicine Team  Team Phone # [phone] (Nights/Weekends) 10/14/2024 3:49 PM  "

## 2024-10-15 DIAGNOSIS — E860 Dehydration: Secondary | ICD-10-CM

## 2024-10-15 DIAGNOSIS — Z515 Encounter for palliative care: Secondary | ICD-10-CM

## 2024-10-15 LAB — BASIC METABOLIC PANEL WITH GFR
Anion gap: 10 (ref 5–15)
BUN: 25 mg/dL — ABNORMAL HIGH (ref 8–23)
CO2: 18 mmol/L — ABNORMAL LOW (ref 22–32)
Calcium: 9.9 mg/dL (ref 8.9–10.3)
Chloride: 115 mmol/L — ABNORMAL HIGH (ref 98–111)
Creatinine, Ser: 1.98 mg/dL — ABNORMAL HIGH (ref 0.61–1.24)
GFR, Estimated: 33 mL/min — ABNORMAL LOW
Glucose, Bld: 104 mg/dL — ABNORMAL HIGH (ref 70–99)
Potassium: 3.5 mmol/L (ref 3.5–5.1)
Sodium: 144 mmol/L (ref 135–145)

## 2024-10-15 LAB — GLUCOSE, CAPILLARY
Glucose-Capillary: 116 mg/dL — ABNORMAL HIGH (ref 70–99)
Glucose-Capillary: 117 mg/dL — ABNORMAL HIGH (ref 70–99)
Glucose-Capillary: 40 mg/dL — CL (ref 70–99)
Glucose-Capillary: 67 mg/dL — ABNORMAL LOW (ref 70–99)

## 2024-10-15 LAB — MAGNESIUM: Magnesium: 2 mg/dL (ref 1.7–2.4)

## 2024-10-15 MED ORDER — POTASSIUM CL IN DEXTROSE 5% 20 MEQ/L IV SOLN
20.0000 meq | INTRAVENOUS | Status: DC
  Administered 2024-10-15: 20 meq via INTRAVENOUS
  Filled 2024-10-15: qty 1000

## 2024-10-15 MED ORDER — POTASSIUM CL IN DEXTROSE 5% 20 MEQ/L IV SOLN
20.0000 meq | INTRAVENOUS | Status: AC
  Administered 2024-10-15: 20 meq via INTRAVENOUS
  Filled 2024-10-15: qty 1000

## 2024-10-15 NOTE — Plan of Care (Signed)
" °  Problem: Clinical Measurements: Goal: Will remain free from infection Outcome: Progressing   Problem: Clinical Measurements: Goal: Diagnostic test results will improve Outcome: Progressing   Problem: Activity: Goal: Risk for activity intolerance will decrease Outcome: Progressing   Problem: Activity: Goal: Risk for activity intolerance will decrease Outcome: Progressing   "

## 2024-10-15 NOTE — Progress Notes (Signed)
 "  Javier [name]  FMW:[id number] DOB: 1940-12-11 DOA: 10/09/2024 PCP: [facility]    Brief Narrative:  84 year old with a history of vascular dementia, CLL in remission, and HTN who presented to the ER 10/09/2024 with generalized abdominal pain and was found on imaging to have severe stercoral colitis.  He underwent disimpaction in the ER with resolution of his abdominal discomfort.  He was admitted for severe dehydration persisting constipation acute kidney injury and associated lactic acidosis.  Goals of Care:   Code Status: Limited: Do not attempt resuscitation (DNR) -DNR-LIMITED -Do Not Intubate/DNI    DVT prophylaxis: enoxaparin  (LOVENOX ) injection 30 mg Start: 10/11/24 1600   Interim Hx: No acute events reported overnight.  Afebrile.  Blood pressure elevated with systolics 158-180.  Creatinine has continued to improve with volume expansion.  Electrolytes balanced.  No evidence of distress at the time of my visit.  Patient has had a bowel movement in his bed at the time that I enter the room which has greatly distressed his wife.  Staff is attending to this.  Assessment & Plan:  Stercoral colitis due to severe constipation Status post manual disimpaction at presentation - continue scheduled bowel regimen  Acute kidney injury Prerenal in the setting of severe dehydration +/- a degree of ATN - usual ARB discontinued- baseline creatinine appears to be approximately 1.0 on review of last year's worth of lab checks - crt improving consistently with ongoing volume support  Recent Labs  Lab 10/11/24 1921 10/12/24 0533 10/13/24 0541 10/14/24 0502 10/15/24 0423  CREATININE 2.37* 2.39* 2.53* 2.37* 1.98*     Metabolic acidosis due to AKI Has required bicarbonate infusion during this admission - IV bicarbonate discontinued when bicarb increased to 20-21 -bicarb is stabilizing with ongoing volume support and subsequent improvement in renal function  Hypernatremia Corrected with  increased free water in IV fluid - continue to encourage oral intake  Hypoglycemia Due to very poor oral intake - has required support with dextrose  IV fluid - cont to push oral intake   Lactic acidosis In the setting of severe colonic inflammation with stercoral colitis  HTN No indication for aggressive blood pressure control in this clinical setting - monitor and avoid extremes   Vascular dementia with failure to thrive Very minimal oral intake likely reflective of progressive dementia - give trial of remeron     Family Communication: Spoke with wife at bedside Disposition:   Skilled Nursing-Short Term Rehab (<3 Hours/Day)10/12/2024 1100 - anticipate d/c 1/19  Objective: Blood pressure (!) 180/72, pulse 78, temperature 98.5 F (36.9 C), temperature source Axillary, resp. rate 16, weight 68.3 kg, SpO2 100%.  Intake/Output Summary (Last 24 hours) at 10/15/2024 0921 Last data filed at 10/15/2024 0300 Gross per 24 hour  Intake 1361.75 ml  Output --  Net 1361.75 ml   Filed Weights   10/14/24 0944  Weight: 68.3 kg    Examination: General: No acute respiratory distress -cachectic Lungs: Clear to auscultation bilaterally  Cardiovascular: RRR without murmur Abdomen: Thin, soft, bowel sounds positive, no mass or rebound Extremities: No significant edema bilateral lower extremities  CBC: Recent Labs  Lab 10/09/24 0835 10/10/24 0901 10/12/24 0533 10/13/24 0541  WBC 5.7 9.6 5.5 4.2  NEUTROABS 4.7  --   --   --   HGB 10.7* 12.4* 9.7* 9.7*  HCT 33.2* 37.5* 29.6* 30.1*  MCV 95.7 92.8 93.1 94.4  PLT 153 157 107* 119*   Basic Metabolic Panel: Recent Labs  Lab 10/11/24 0210 10/11/24 1921 10/12/24  0533 10/13/24 0541 10/14/24 0502 10/15/24 0423  NA 141   < > 144 147* 148* 144  K 4.4   < > 3.4* 3.1* 3.2* 3.5  CL 109   < > 114* 118* 119* 115*  CO2 18*   < > 20* 17* 18* 18*  GLUCOSE 64*   < > 132* 91 105* 104*  BUN 43*   < > 37* 30* 27* 25*  CREATININE 2.10*   < > 2.39*  2.53* 2.37* 1.98*  CALCIUM  8.9   < > 8.9 9.5 9.5 9.9  MG 2.8*  --  2.7*  --  2.4 2.0  PHOS 3.8  --  2.5  --   --   --    < > = values in this interval not displayed.   GFR: Estimated Creatinine Clearance: 27.3 mL/min (A) (by C-G formula based on SCr of 1.98 mg/dL (H)).   Scheduled Meds:  aspirin  EC  81 mg Oral Daily   Chlorhexidine  Gluconate Cloth  6 each Topical Daily   enoxaparin  (LOVENOX ) injection  30 mg Subcutaneous Q24H   feeding supplement  237 mL Oral TID BM   mirtazapine   15 mg Oral QHS   multivitamin with minerals  1 tablet Oral Daily   polyethylene glycol  17 g Oral BID   potassium chloride   20 mEq Oral BID   QUEtiapine   50 mg Oral QHS   senna-docusate  1 tablet Oral QHS   tamsulosin   0.4 mg Oral Daily   thiamine   100 mg Oral Daily   traZODone   100 mg Oral QHS   Continuous Infusions:  dextrose  5 % with KCl 20 mEq / L 20 mEq (10/15/24 0124)     LOS: 6 days   [name], MD [facility] Office  [phone] Pager - Text Page per Tracey  If 7PM-7AM, please contact night-coverage per Amion 10/15/2024, 9:21 AM     "

## 2024-10-15 NOTE — Plan of Care (Signed)
  Problem: Activity: Goal: Risk for activity intolerance will decrease Outcome: Progressing   Problem: Elimination: Goal: Will not experience complications related to bowel motility Outcome: Progressing   Problem: Pain Managment: Goal: General experience of comfort will improve and/or be controlled Outcome: Progressing

## 2024-10-15 NOTE — Plan of Care (Signed)
" ° ° ° °  Referral previously received for Javier [name] for goals of care discussion. Noted most recent palliative in-person assessment dated 10/14/2024.  Signout from previous provider requested follow from a distance/chart check.   Chart reviewed for Recent provider notes, nurse notes, vitals, and labs and updates received from RN.   At this time patient appears stable and slightly improved.  Labs are somewhat improving creatinine now below 2.0.  Stercoral colitis resolved with disimpaction and remains on bowel regimen.  Continue poor oral intake, continuing to encourage.  Anticipate discharge in 48 hours per hospitalist, appears to be waiting on a bed at the nursing facility.   No plan for in person follow-up at this time, we will back off unless new needs develop given anticipated discharge in the next 1 to 2 days. Please contact the palliative medicine provider on service for any new/urgent needs that require our assistance with this patient.  Thank you for your referral and allowing PMT to assist in Javier [name]'s care.   Camellia Kays, NP Palliative Medicine Team Phone: [phone]  NO CHARGE  "

## 2024-10-16 DIAGNOSIS — E860 Dehydration: Secondary | ICD-10-CM | POA: Diagnosis not present

## 2024-10-16 LAB — BASIC METABOLIC PANEL WITH GFR
Anion gap: 11 (ref 5–15)
BUN: 24 mg/dL — ABNORMAL HIGH (ref 8–23)
CO2: 20 mmol/L — ABNORMAL LOW (ref 22–32)
Calcium: 10 mg/dL (ref 8.9–10.3)
Chloride: 115 mmol/L — ABNORMAL HIGH (ref 98–111)
Creatinine, Ser: 1.8 mg/dL — ABNORMAL HIGH (ref 0.61–1.24)
GFR, Estimated: 37 mL/min — ABNORMAL LOW
Glucose, Bld: 98 mg/dL (ref 70–99)
Potassium: 4 mmol/L (ref 3.5–5.1)
Sodium: 146 mmol/L — ABNORMAL HIGH (ref 135–145)

## 2024-10-16 LAB — GLUCOSE, CAPILLARY
Glucose-Capillary: 57 mg/dL — ABNORMAL LOW (ref 70–99)
Glucose-Capillary: 113 mg/dL — ABNORMAL HIGH (ref 70–99)

## 2024-10-16 MED ORDER — HYDROCODONE-ACETAMINOPHEN 5-325 MG PO TABS
1.0000 | ORAL_TABLET | Freq: Four times a day (QID) | ORAL | Status: DC | PRN
  Administered 2024-10-16: 1 via ORAL
  Administered 2024-10-16: 2 via ORAL
  Filled 2024-10-16: qty 2
  Filled 2024-10-16 (×2): qty 1

## 2024-10-16 MED ORDER — POLYETHYLENE GLYCOL 3350 17 G PO PACK
17.0000 g | PACK | Freq: Every day | ORAL | Status: DC
  Administered 2024-10-16 – 2024-10-17 (×2): 17 g via ORAL
  Filled 2024-10-16 (×2): qty 1

## 2024-10-16 MED ORDER — POTASSIUM CL IN DEXTROSE 5% 20 MEQ/L IV SOLN
20.0000 meq | INTRAVENOUS | Status: DC
  Administered 2024-10-16 (×2): 20 meq via INTRAVENOUS
  Filled 2024-10-16 (×3): qty 1000

## 2024-10-16 MED ORDER — GUAIFENESIN 100 MG/5ML PO LIQD
5.0000 mL | Freq: Three times a day (TID) | ORAL | Status: DC
  Administered 2024-10-16 – 2024-10-17 (×3): 5 mL via ORAL
  Filled 2024-10-16 (×3): qty 10

## 2024-10-16 NOTE — Progress Notes (Signed)
 "  Javier [name]  FMW:[id number] DOB: 06/17/1941 DOA: 10/09/2024 PCP: [facility] [address]    Brief Narrative:  84 year old with a history of vascular dementia, CLL in remission, and HTN who presented to the ER 10/09/2024 with generalized abdominal pain and was found on imaging to have severe stercoral colitis.  He underwent disimpaction in the ER with resolution of his abdominal discomfort.  He was admitted for severe dehydration persisting constipation acute kidney injury and associated lactic acidosis.  Goals of Care:   Code Status: Limited: Do not attempt resuscitation (DNR) -DNR-LIMITED -Do Not Intubate/DNI    DVT prophylaxis: enoxaparin  (LOVENOX ) injection 30 mg Start: 10/11/24 1600   Interim Hx: Afebrile.  Vital signs stable.  No acute events recorded overnight.  It would appear that his CBG is not correlating well with his serum glucose.  He is resting comfortably at the time of my visit.  His wife is at his bedside.  Assessment & Plan:  Stercoral colitis due to severe constipation Status post manual disimpaction at presentation - continue scheduled bowel regimen - continues to have regular bowel movements often times mixed with mucus due to poor intake  Acute kidney injury Prerenal in the setting of severe dehydration +/- a degree of ATN - usual ARB discontinued- baseline creatinine appears to be approximately 1.0 on review of last year's worth of lab checks - crt improving consistently with ongoing volume support  Recent Labs  Lab 10/12/24 0533 10/13/24 0541 10/14/24 0502 10/15/24 0423 10/16/24 0434  CREATININE 2.39* 2.53* 2.37* 1.98* 1.80*     Metabolic acidosis due to AKI Has required bicarbonate infusion during this admission - IV bicarbonate discontinued when bicarb increased to 20-21 -bicarb is stabilizing with ongoing volume support and subsequent improvement in renal function  Hypernatremia improved with increased free water in IV fluid - continue to encourage oral  intake  Hypoglycemia The patient's CBGs do not appear to correlate well with his serum glucose values -despite recorded CBGs as low as 40 he does not experience obvious time related clinical symptoms -I do not feel that his CBGs are consistently reliable  Lactic acidosis In the setting of severe colonic inflammation with stercoral colitis -resolved  HTN No indication for aggressive blood pressure control in this clinical setting - monitor and avoid extremes   Vascular dementia with failure to thrive Very minimal oral intake likely reflective of progressive dementia - give trial of remeron     Family Communication: Spoke with wife at bedside Disposition:   Skilled Nursing-Short Term Rehab (<3 Hours/Day)10/12/2024 1100 - anticipate d/c 1/19  Objective: Blood pressure 132/76, pulse 68, temperature 98.8 F (37.1 C), temperature source Oral, resp. rate 16, weight 68.3 kg, SpO2 100%.  Intake/Output Summary (Last 24 hours) at 10/16/2024 0852 Last data filed at 10/15/2024 2300 Gross per 24 hour  Intake 375.61 ml  Output 400 ml  Net -24.39 ml   Filed Weights   10/14/24 0944  Weight: 68.3 kg    Examination: General: No acute respiratory distress - cachectic Lungs: Clear to auscultation bilaterally  Cardiovascular: RRR  Abdomen: Thin, soft, bowel sounds positive, no mass or rebound Extremities: No significant edema bilateral lower extremities  CBC: Recent Labs  Lab 10/10/24 0901 10/12/24 0533 10/13/24 0541  WBC 9.6 5.5 4.2  HGB 12.4* 9.7* 9.7*  HCT 37.5* 29.6* 30.1*  MCV 92.8 93.1 94.4  PLT 157 107* 119*   Basic Metabolic Panel: Recent Labs  Lab 10/11/24 0210 10/11/24 1921 10/12/24 0533 10/13/24 0541 10/14/24 0502 10/15/24  0423 10/16/24 0434  NA 141   < > 144   < > 148* 144 146*  K 4.4   < > 3.4*   < > 3.2* 3.5 4.0  CL 109   < > 114*   < > 119* 115* 115*  CO2 18*   < > 20*   < > 18* 18* 20*  GLUCOSE 64*   < > 132*   < > 105* 104* 98  BUN 43*   < > 37*   < > 27*  25* 24*  CREATININE 2.10*   < > 2.39*   < > 2.37* 1.98* 1.80*  CALCIUM  8.9   < > 8.9   < > 9.5 9.9 10.0  MG 2.8*  --  2.7*  --  2.4 2.0  --   PHOS 3.8  --  2.5  --   --   --   --    < > = values in this interval not displayed.   GFR: Estimated Creatinine Clearance: 30 mL/min (A) (by C-G formula based on SCr of 1.8 mg/dL (H)).   Scheduled Meds:  aspirin  EC  81 mg Oral Daily   Chlorhexidine  Gluconate Cloth  6 each Topical Daily   enoxaparin  (LOVENOX ) injection  30 mg Subcutaneous Q24H   feeding supplement  237 mL Oral TID BM   mirtazapine   15 mg Oral QHS   multivitamin with minerals  1 tablet Oral Daily   polyethylene glycol  17 g Oral BID   potassium chloride   20 mEq Oral BID   QUEtiapine   50 mg Oral QHS   senna-docusate  1 tablet Oral QHS   tamsulosin   0.4 mg Oral Daily   thiamine   100 mg Oral Daily   traZODone   100 mg Oral QHS   Continuous Infusions:  dextrose  5 % with KCl 20 mEq / L 20 mEq (10/15/24 1720)     LOS: 7 days   [name], MD [facility] Office  [phone] Pager - Text Page per Tracey  If 7PM-7AM, please contact night-coverage per Amion 10/16/2024, 8:52 AM     "

## 2024-10-16 NOTE — Plan of Care (Signed)
  Problem: Activity: Goal: Risk for activity intolerance will decrease Outcome: Progressing   Problem: Nutrition: Goal: Adequate nutrition will be maintained Outcome: Progressing   Problem: Elimination: Goal: Will not experience complications related to urinary retention Outcome: Progressing   

## 2024-10-16 NOTE — Plan of Care (Signed)
  Problem: Nutrition: Goal: Adequate nutrition will be maintained Outcome: Progressing   Problem: Safety: Goal: Ability to remain free from injury will improve Outcome: Progressing   

## 2024-10-17 DIAGNOSIS — E860 Dehydration: Secondary | ICD-10-CM | POA: Diagnosis not present

## 2024-10-17 LAB — GLUCOSE, CAPILLARY
Glucose-Capillary: 70 mg/dL (ref 70–99)
Glucose-Capillary: 72 mg/dL (ref 70–99)

## 2024-10-17 LAB — BASIC METABOLIC PANEL WITH GFR
Anion gap: 8 (ref 5–15)
BUN: 23 mg/dL (ref 8–23)
CO2: 21 mmol/L — ABNORMAL LOW (ref 22–32)
Calcium: 9.7 mg/dL (ref 8.9–10.3)
Chloride: 111 mmol/L (ref 98–111)
Creatinine, Ser: 1.71 mg/dL — ABNORMAL HIGH (ref 0.61–1.24)
GFR, Estimated: 39 mL/min — ABNORMAL LOW
Glucose, Bld: 111 mg/dL — ABNORMAL HIGH (ref 70–99)
Potassium: 3.6 mmol/L (ref 3.5–5.1)
Sodium: 140 mmol/L (ref 135–145)

## 2024-10-17 MED ORDER — GUAIFENESIN 100 MG/5ML PO LIQD: 5.0000 mL | ORAL | Status: AC | PRN

## 2024-10-17 MED ORDER — SENNOSIDES-DOCUSATE SODIUM 8.6-50 MG PO TABS: 1.0000 | ORAL_TABLET | Freq: Every day | ORAL | Status: AC

## 2024-10-17 MED ORDER — ENSURE PLUS HIGH PROTEIN PO LIQD: 237.0000 mL | Freq: Three times a day (TID) | ORAL | Status: AC

## 2024-10-17 MED ORDER — POLYETHYLENE GLYCOL 3350 17 G PO PACK: 17.0000 g | PACK | Freq: Every day | ORAL | Status: AC

## 2024-10-17 MED ORDER — ACETAMINOPHEN 325 MG PO TABS: 650.0000 mg | ORAL_TABLET | Freq: Four times a day (QID) | ORAL | Status: AC | PRN

## 2024-10-17 MED ORDER — TAMSULOSIN HCL 0.4 MG PO CAPS: 0.4000 mg | ORAL_CAPSULE | Freq: Every day | ORAL | Status: AC

## 2024-10-17 MED ORDER — ADULT MULTIVITAMIN W/MINERALS CH: 1.0000 | ORAL_TABLET | Freq: Every day | ORAL | Status: AC

## 2024-10-17 MED ORDER — MIRTAZAPINE 15 MG PO TABS: 15.0000 mg | ORAL_TABLET | Freq: Every day | ORAL | Status: AC

## 2024-10-17 MED ORDER — QUETIAPINE FUMARATE 50 MG PO TABS: 50.0000 mg | ORAL_TABLET | Freq: Every day | ORAL | Status: AC

## 2024-10-17 NOTE — Progress Notes (Signed)
 Physical Therapy Treatment Patient Details Name: CHANDLOR NOECKER MRN: [id number] DOB: 1941-09-10 Today's Date: 10/17/2024   History of Present Illness Pt is a 84 y.o. male who presented with abdominal pain and found to have severe stercoral colitis with disimpaction in ED.  Dehydration and hypotension and w/u for sepsis. PMHx of pre DM II, HLD, HTN, CLL, vascular dementia, remote lacunar stroke, BPH, gastric AVM with weight loss, remote tobacco abuse, anxiety, and Vit D deficiency    PT Comments  Continuing work on functional mobility and activity tolerance;  Session focused on social participation and functional transfers, with good participation and some noted progress; Pt maintained social conversation, and made good effort to get up and OOB; once in recliner comfortably, he drank some water, and nutrition shake; noted fo rdc to [facility] today    If plan is discharge home, recommend the following: Two people to help with walking and/or transfers;A lot of help with bathing/dressing/bathroom;Assistance with cooking/housework;Assist for transportation   Can travel by private vehicle     No  Equipment Recommendations  Other (comment) (remains TBD)    Recommendations for Other Services       Precautions / Restrictions Precautions Precautions: Fall Recall of Precautions/Restrictions: Impaired Restrictions Weight Bearing Restrictions Per Provider Order: No     Mobility  Bed Mobility Overal bed mobility: Needs Assistance Bed Mobility: Supine to Sit     Supine to sit: Min assist, HOB elevated, Used rails     General bed mobility comments: max cues for sequcning with min assist coming to EOB    Transfers Overall transfer level: Needs assistance Equipment used: 1 person hand held assist (second person present for safety) Transfers: Sit to/from Stand, Bed to chair/wheelchair/BSC Sit to Stand: Mod assist, +2 safety/equipment   Step pivot transfers: Mod assist, +2  safety/equipment       General transfer comment: stands with bil UE support; on first attempt to get to teh chair, pt reached out to bil chair armrests, and had difficulty problem-solving how to turn to sit; sat back to bed, then for second try, stood fullyup with 2 person handheld assist first, then took pivot steps to recliner    Ambulation/Gait                   Stairs             Wheelchair Mobility     Tilt Bed    Modified Rankin (Stroke Patients Only)       Balance Overall balance assessment: Needs assistance Sitting-balance support: Feet supported Sitting balance-Leahy Scale: Good     Standing balance support: Bilateral upper extremity supported, During functional activity Standing balance-Leahy Scale: Poor Standing balance comment: exteral assist to maintain balance                            Communication Communication Communication: No apparent difficulties (for light, social interaction)  Cognition Arousal: Alert Behavior During Therapy: WFL for tasks assessed/performed   PT - Cognitive impairments: History of cognitive impairments, No family/caregiver present to determine baseline                       PT - Cognition Comments: Makes eye contact; participates in light social conversation; difficulty with motor planning Following commands: Impaired Following commands impaired: Follows one step commands with increased time (and multimodal cueing)    Cueing Cueing Techniques: Verbal cues, Gestural cues, Tactile cues, Visual  cues  Exercises      General Comments General comments (skin integrity, edema, etc.): NAD on room air; Once comfortable in recliner, pt drank some of his vanilla nutrition shake and some water      Pertinent Vitals/Pain Pain Assessment Pain Assessment: Faces Faces Pain Scale: Hurts little more Pain Location: Noting occasional grimace with scooting at EOB Pain Descriptors / Indicators:  Grimacing Pain Intervention(s): Monitored during session    Home Living                          Prior Function            PT Goals (current goals can now be found in the care plan section) Acute Rehab PT Goals Patient Stated Goal: Did not state, but agreeing to get OOB Progress towards PT goals: Progressing toward goals    Frequency    Min 2X/week      PT Plan      Co-evaluation              AM-PAC PT 6 Clicks Mobility   Outcome Measure  Help needed turning from your back to your side while in a flat bed without using bedrails?: A Little Help needed moving from lying on your back to sitting on the side of a flat bed without using bedrails?: A Little Help needed moving to and from a bed to a chair (including a wheelchair)?: A Lot Help needed standing up from a chair using your arms (e.g., wheelchair or bedside chair)?: A Lot Help needed to walk in hospital room?: Total Help needed climbing 3-5 steps with a railing? : Total 6 Click Score: 12    End of Session Equipment Utilized During Treatment: Gait belt Activity Tolerance: Patient tolerated treatment well Patient left: in chair;with call bell/phone within reach;with chair alarm set Nurse Communication: Mobility status PT Visit Diagnosis: Unsteadiness on feet (R26.81);Muscle weakness (generalized) (M62.81);Pain     Time: 9047-8988 PT Time Calculation (min) (ACUTE ONLY): 19 min  Charges:    $Therapeutic Activity: 8-22 mins PT General Charges $$ ACUTE PT VISIT: 1 Visit                     Silvano Currier, PT  Acute Rehabilitation Services Office [phone] Secure Chat welcomed    Silvano VEAR Currier 10/17/2024, 2:07 PM

## 2024-10-17 NOTE — TOC Transition Note (Signed)
 Transition of Care [facility]) - Discharge Note   Patient Details  Name: Javier [name] MRN: [id number] Date of Birth: 1940-10-25  [facility]) CM/SW Contact:  Bridget Cordella Simmonds, LCSW Phone Number: 10/17/2024, 12:53 PM   Clinical Narrative:  Pt discharging to [facility]. RN call report to [phone].    PTAR called [phone].  Final next level of care: Skilled Nursing Facility Barriers to Discharge: Barriers Resolved   Patient Goals and CMS Choice Patient states their goals for this hospitalization and ongoing recovery are:: SNF          Discharge Placement              Patient chooses bed at: [facility] Patient to be transferred to facility by: ptar Name of family member notified: wife [name] in room Patient and family notified of of transfer: 10/17/24  Discharge Plan and Services Additional resources added to the After Visit Summary for                                       Social Drivers of Health (SDOH) Interventions SDOH Screenings   Food Insecurity: Patient Unable To Answer (10/11/2024)  Housing: Patient Unable To Answer (10/11/2024)  Transportation Needs: Patient Unable To Answer (10/11/2024)  Utilities: Patient Unable To Answer (10/11/2024)  Alcohol Screen: Low Risk (05/05/2024)  Depression (PHQ2-9): Medium Risk (05/05/2024)  Financial Resource Strain: Low Risk (05/05/2024)  Physical Activity: Inactive (05/05/2024)  Social Connections: Patient Unable To Answer (10/11/2024)  Stress: Stress Concern Present (05/05/2024)  Tobacco Use: Medium Risk (09/13/2024)  Health Literacy: Adequate Health Literacy (05/05/2024)     Readmission Risk Interventions     No data to display

## 2024-10-17 NOTE — Telephone Encounter (Signed)
 Pt is still currently in hospital.  I did fax last OV to pcp.

## 2024-10-17 NOTE — Plan of Care (Signed)
   Problem: Activity: Goal: Risk for activity intolerance will decrease Outcome: Progressing   Problem: Nutrition: Goal: Adequate nutrition will be maintained Outcome: Progressing

## 2024-10-17 NOTE — Discharge Summary (Addendum)
 "  DISCHARGE SUMMARY  Javier [name]  MR#: [id number]  DOB:12-11-1940  Date of Admission: 10/09/2024 Date of Discharge: 10/17/2024  Attending Physician:Kyndle Schlender ONEIDA Moores, MD  Patient's ERE:[facility], [facility]  Disposition: D/C to SNF for rehab stay   Follow-up Appts:  Follow-up Information     [facility] [address] Follow up.   Why: Make an appointment to be seen 1 week after you are released from your rehab facility. Contact information: [address] [place] [address] [phone]                 Tests Needing Follow-up: -assure patient is having consistent bowel movements -patient will need daily assistance with meals to assure he is keeping hydrated and eating well -ongoing discussions with family will be appropriate as patient appears to be progressing toward terminal dementia   Discharge Diagnoses: Stercoral colitis due to severe constipation Acute kidney injury Metabolic acidosis due to AKI Hypernatremia Hypoglycemia Lactic acidosis HTN Vascular dementia with failure to thrive  Initial presentation: 85 year old with a history of vascular dementia, CLL in remission, and HTN who presented to the ER 10/09/2024 with generalized abdominal pain and was found on imaging to have severe stercoral colitis. He underwent disimpaction in the ER with resolution of his abdominal discomfort. He was admitted for severe dehydration persisting constipation acute kidney injury and associated lactic acidosis.   Hospital Course:  Stercoral colitis due to severe constipation Status post manual disimpaction at presentation - continue scheduled bowel regimen - continues to have regular bowel movements often times mixed with mucus due to poor intake    Acute kidney injury Prerenal in the setting of severe dehydration +/- a degree of ATN - usual ARB discontinued - baseline creatinine appears to be approximately 1.0 on review of last year's worth of lab checks - crt improved  consistently with ongoing volume support    Metabolic acidosis due to AKI required short term bicarbonate infusion during this admission - IV bicarbonate discontinued when bicarb increased to 20-21 - bicarb stabilized with ongoing volume support and subsequent improvement in renal function   Hypernatremia improved with increased free water in IV fluid - continue to encourage oral intake   Hypoglycemia The patient's CBGs do not appear to correlate well with his serum glucose values - despite recorded CBGs as low as 40 he does not experience obvious related clinical symptoms -I do not feel that his CBGs are consistently reliable   Lactic acidosis In the setting of severe colonic inflammation with stercoral colitis - resolved   HTN No indication for aggressive blood pressure control in this clinical setting - monitor and avoid extremes    Vascular dementia with failure to thrive Very minimal oral intake likely reflective of progressive dementia - give trial of remeron  - Palliative Care met with family - patient is appropriately DNR/NCB   Nutrition Problem: Severe Malnutrition Etiology: chronic illness Signs/Symptoms: severe muscle depletion, severe fat depletion, energy intake < 75% for > or equal to 1 month Interventions: Refer to RD note for recommendations    Allergies as of 10/17/2024       Reactions   Penicillins Other (See Comments), Shortness Of Breath, Swelling, Dermatitis   Unknown allergic reaction as a teenager   Ppd [tuberculin Purified Protein Derivative]    Positive test in 2004   Tuberculin, Ppd Other (See Comments)   Positive test in 2004        Medication List     STOP taking these medications  albuterol 108 (90 Base) MCG/ACT inhaler Commonly known as: VENTOLIN HFA   dicyclomine  10 MG capsule Commonly known as: BENTYL    losartan  100 MG tablet Commonly known as: COZAAR    rosuvastatin  40 MG tablet Commonly known as: CRESTOR     sulfamethoxazole-trimethoprim 400-80 MG tablet Commonly known as: BACTRIM   traZODone  100 MG tablet Commonly known as: DESYREL    venlafaxine  XR 75 MG 24 hr capsule Commonly known as: EFFEXOR -XR   Vitamin D3 125 MCG (5000 UT) Tabs       TAKE these medications    acetaminophen  325 MG tablet Commonly known as: TYLENOL  Take 2 tablets (650 mg total) by mouth every 6 (six) hours as needed for headache or fever.   aspirin  EC 81 MG tablet Take 81 mg by mouth daily.   feeding supplement Liqd Take 237 mLs by mouth 3 (three) times daily between meals.   guaiFENesin  100 MG/5ML liquid Commonly known as: ROBITUSSIN Take 5 mLs by mouth every 4 (four) hours as needed for to loosen phlegm.   mirtazapine  15 MG tablet Commonly known as: REMERON  Take 1 tablet (15 mg total) by mouth at bedtime.   multivitamin with minerals Tabs tablet Take 1 tablet by mouth daily.   polyethylene glycol 17 g packet Commonly known as: MIRALAX  / GLYCOLAX  Take 17 g by mouth daily.   QUEtiapine  50 MG tablet Commonly known as: SEROQUEL  Take 1 tablet (50 mg total) by mouth at bedtime. What changed:  medication strength how much to take   senna-docusate 8.6-50 MG tablet Commonly known as: Senokot-S Take 1 tablet by mouth at bedtime.   tamsulosin  0.4 MG Caps capsule Commonly known as: FLOMAX  Take 1 capsule (0.4 mg total) by mouth daily.        Day of Discharge BP (!) 145/53 (BP Location: Right Arm)   Pulse 66   Temp 98.2 F (36.8 C)   Resp 18   Wt 68.3 kg Comment: Bed weight  SpO2 96%   BMI 21.00 kg/m   Physical Exam: General: No acute respiratory distress Lungs: Clear to auscultation bilaterally without wheezes or crackles Cardiovascular: Regular rate and rhythm without murmur gallop or rub normal S1 and S2 Abdomen: Nontender, nondistended, soft, bowel sounds positive, no rebound, no ascites, no appreciable mass Extremities: No significant cyanosis, clubbing, or edema bilateral lower  extremities  Basic Metabolic Panel: Recent Labs  Lab 10/11/24 0210 10/11/24 1921 10/12/24 0533 10/13/24 0541 10/14/24 0502 10/15/24 0423 10/16/24 0434 10/17/24 0527  NA 141   < > 144 147* 148* 144 146* 140  K 4.4   < > 3.4* 3.1* 3.2* 3.5 4.0 3.6  CL 109   < > 114* 118* 119* 115* 115* 111  CO2 18*   < > 20* 17* 18* 18* 20* 21*  GLUCOSE 64*   < > 132* 91 105* 104* 98 111*  BUN 43*   < > 37* 30* 27* 25* 24* 23  CREATININE 2.10*   < > 2.39* 2.53* 2.37* 1.98* 1.80* 1.71*  CALCIUM  8.9   < > 8.9 9.5 9.5 9.9 10.0 9.7  MG 2.8*  --  2.7*  --  2.4 2.0  --   --   PHOS 3.8  --  2.5  --   --   --   --   --    < > = values in this interval not displayed.    CBC: Recent Labs  Lab 10/10/24 0901 10/12/24 0533 10/13/24 0541  WBC 9.6 5.5 4.2  HGB 12.4*  9.7* 9.7*  HCT 37.5* 29.6* 30.1*  MCV 92.8 93.1 94.4  PLT 157 107* 119*    Time spent in discharge (includes decision making & examination of pt): 35 minutes  10/17/2024, 8:48 AM   [name], MD [facility] Office  [phone]      "

## 2024-10-17 NOTE — Progress Notes (Signed)
 Called [facility] twice to give report, same unsuccessful

## 2024-10-17 NOTE — TOC Progression Note (Addendum)
 Transition of Care [facility]) - Progression Note    Patient Details  Name: Javier [name] MRN: [id number] Date of Birth: 1941-01-22  Transition of Care [facility]) CM/SW Contact  Bridget Cordella Simmonds, LCSW Phone Number: 10/17/2024, 11:18 AM  Clinical Narrative:   CSW sent message to Myrene armin Merle at [facility] for update on [facility] auth.   1230: Message from Tanya/[facility]: auth approved, they can receive pt today.   Expected Discharge Plan: Skilled Nursing Facility Barriers to Discharge: Continued Medical Work up, English As A Second Language Teacher, SNF Pending bed offer               Expected Discharge Plan and Services       Living arrangements for the past 2 months: Single Family Home Expected Discharge Date: 10/17/24                                     Social Drivers of Health (SDOH) Interventions SDOH Screenings   Food Insecurity: Patient Unable To Answer (10/11/2024)  Housing: Patient Unable To Answer (10/11/2024)  Transportation Needs: Patient Unable To Answer (10/11/2024)  Utilities: Patient Unable To Answer (10/11/2024)  Alcohol Screen: Low Risk (05/05/2024)  Depression (PHQ2-9): Medium Risk (05/05/2024)  Financial Resource Strain: Low Risk (05/05/2024)  Physical Activity: Inactive (05/05/2024)  Social Connections: Patient Unable To Answer (10/11/2024)  Stress: Stress Concern Present (05/05/2024)  Tobacco Use: Medium Risk (09/13/2024)  Health Literacy: Adequate Health Literacy (05/05/2024)    Readmission Risk Interventions     No data to display

## 2025-03-21 ENCOUNTER — Ambulatory Visit: Admitting: Neurology
